# Patient Record
Sex: Female | Born: 1960 | Race: White | Hispanic: No | Marital: Married | Smoking: Current every day smoker
Health system: Southern US, Community
[De-identification: ages and names within clinical notes are randomized; demographics above are authoritative.]

## PROBLEM LIST (undated history)

## (undated) DIAGNOSIS — D649 Anemia, unspecified: Secondary | ICD-10-CM

## (undated) DIAGNOSIS — K56699 Other intestinal obstruction unspecified as to partial versus complete obstruction: Secondary | ICD-10-CM

## (undated) DIAGNOSIS — K219 Gastro-esophageal reflux disease without esophagitis: Secondary | ICD-10-CM

## (undated) DIAGNOSIS — Z8489 Family history of other specified conditions: Secondary | ICD-10-CM

## (undated) DIAGNOSIS — I10 Essential (primary) hypertension: Secondary | ICD-10-CM

## (undated) DIAGNOSIS — K56609 Unspecified intestinal obstruction, unspecified as to partial versus complete obstruction: Secondary | ICD-10-CM

## (undated) DIAGNOSIS — J45909 Unspecified asthma, uncomplicated: Secondary | ICD-10-CM

## (undated) DIAGNOSIS — C801 Malignant (primary) neoplasm, unspecified: Secondary | ICD-10-CM

## (undated) DIAGNOSIS — C539 Malignant neoplasm of cervix uteri, unspecified: Secondary | ICD-10-CM

## (undated) DIAGNOSIS — N179 Acute kidney failure, unspecified: Secondary | ICD-10-CM

## (undated) DIAGNOSIS — Z9221 Personal history of antineoplastic chemotherapy: Secondary | ICD-10-CM

## (undated) DIAGNOSIS — K635 Polyp of colon: Secondary | ICD-10-CM

## (undated) DIAGNOSIS — I745 Embolism and thrombosis of iliac artery: Secondary | ICD-10-CM

## (undated) DIAGNOSIS — Z8719 Personal history of other diseases of the digestive system: Secondary | ICD-10-CM

## (undated) DIAGNOSIS — T7840XA Allergy, unspecified, initial encounter: Secondary | ICD-10-CM

## (undated) DIAGNOSIS — F419 Anxiety disorder, unspecified: Secondary | ICD-10-CM

## (undated) DIAGNOSIS — IMO0001 Reserved for inherently not codable concepts without codable children: Secondary | ICD-10-CM

## (undated) DIAGNOSIS — F32A Depression, unspecified: Secondary | ICD-10-CM

## (undated) DIAGNOSIS — F329 Major depressive disorder, single episode, unspecified: Secondary | ICD-10-CM

## (undated) DIAGNOSIS — Z923 Personal history of irradiation: Secondary | ICD-10-CM

## (undated) DIAGNOSIS — Z8541 Personal history of malignant neoplasm of cervix uteri: Secondary | ICD-10-CM

## (undated) DIAGNOSIS — G43909 Migraine, unspecified, not intractable, without status migrainosus: Secondary | ICD-10-CM

## (undated) DIAGNOSIS — E785 Hyperlipidemia, unspecified: Secondary | ICD-10-CM

## (undated) DIAGNOSIS — Z8709 Personal history of other diseases of the respiratory system: Secondary | ICD-10-CM

## (undated) HISTORY — DX: Malignant neoplasm of cervix uteri, unspecified: C53.9

## (undated) HISTORY — DX: Unspecified intestinal obstruction, unspecified as to partial versus complete obstruction: K56.609

## (undated) HISTORY — DX: Malignant (primary) neoplasm, unspecified: C80.1

## (undated) HISTORY — PX: TUBAL LIGATION: SHX77

## (undated) HISTORY — DX: Personal history of malignant neoplasm of cervix uteri: Z85.41

## (undated) HISTORY — DX: Allergy, unspecified, initial encounter: T78.40XA

## (undated) HISTORY — DX: Polyp of colon: K63.5

## (undated) HISTORY — PX: UPPER GASTROINTESTINAL ENDOSCOPY: SHX188

## (undated) HISTORY — DX: Other intestinal obstruction unspecified as to partial versus complete obstruction: K56.699

## (undated) HISTORY — PX: COLONOSCOPY: SHX174

## (undated) HISTORY — PX: PICC LINE PLACE PERIPHERAL (ARMC HX): HXRAD1248

## (undated) HISTORY — DX: Hyperlipidemia, unspecified: E78.5

## (undated) HISTORY — PX: COLON SURGERY: SHX602

## (undated) HISTORY — DX: Unspecified asthma, uncomplicated: J45.909

## (undated) HISTORY — PX: OTHER SURGICAL HISTORY: SHX169

---

## 1992-05-12 HISTORY — PX: RADICAL ABDOMINAL HYSTERECTOMY: SUR659

## 1992-05-12 HISTORY — PX: ABDOMINAL HYSTERECTOMY: SHX81

## 1997-10-28 ENCOUNTER — Ambulatory Visit (HOSPITAL_COMMUNITY): Admission: RE | Admit: 1997-10-28 | Discharge: 1997-10-28 | Payer: Self-pay | Admitting: Family Medicine

## 1997-11-02 ENCOUNTER — Ambulatory Visit (HOSPITAL_COMMUNITY): Admission: RE | Admit: 1997-11-02 | Discharge: 1997-11-02 | Payer: Self-pay | Admitting: Internal Medicine

## 1997-11-30 ENCOUNTER — Other Ambulatory Visit: Admission: RE | Admit: 1997-11-30 | Discharge: 1997-11-30 | Payer: Self-pay | Admitting: Gynecology

## 1998-02-28 ENCOUNTER — Other Ambulatory Visit: Admission: RE | Admit: 1998-02-28 | Discharge: 1998-02-28 | Payer: Self-pay | Admitting: Gynecology

## 1998-04-03 ENCOUNTER — Other Ambulatory Visit: Admission: RE | Admit: 1998-04-03 | Discharge: 1998-04-03 | Payer: Self-pay | Admitting: Gynecology

## 1998-04-17 ENCOUNTER — Ambulatory Visit: Admission: RE | Admit: 1998-04-17 | Discharge: 1998-04-17 | Payer: Self-pay | Admitting: Gynecology

## 1998-06-07 ENCOUNTER — Other Ambulatory Visit: Admission: RE | Admit: 1998-06-07 | Discharge: 1998-06-07 | Payer: Self-pay | Admitting: Gynecology

## 1998-07-10 ENCOUNTER — Other Ambulatory Visit: Admission: RE | Admit: 1998-07-10 | Discharge: 1998-07-10 | Payer: Self-pay | Admitting: Gynecology

## 1998-08-21 ENCOUNTER — Other Ambulatory Visit: Admission: RE | Admit: 1998-08-21 | Discharge: 1998-08-21 | Payer: Self-pay | Admitting: Gynecology

## 1998-10-22 ENCOUNTER — Other Ambulatory Visit: Admission: RE | Admit: 1998-10-22 | Discharge: 1998-10-22 | Payer: Self-pay | Admitting: Gynecology

## 1999-02-04 ENCOUNTER — Other Ambulatory Visit: Admission: RE | Admit: 1999-02-04 | Discharge: 1999-02-04 | Payer: Self-pay | Admitting: Gynecology

## 1999-03-21 ENCOUNTER — Other Ambulatory Visit: Admission: RE | Admit: 1999-03-21 | Discharge: 1999-03-21 | Payer: Self-pay | Admitting: Gynecology

## 1999-03-21 ENCOUNTER — Encounter (INDEPENDENT_AMBULATORY_CARE_PROVIDER_SITE_OTHER): Payer: Self-pay

## 1999-06-14 ENCOUNTER — Other Ambulatory Visit: Admission: RE | Admit: 1999-06-14 | Discharge: 1999-06-14 | Payer: Self-pay | Admitting: Gynecology

## 1999-06-19 ENCOUNTER — Encounter: Admission: RE | Admit: 1999-06-19 | Discharge: 1999-06-19 | Payer: Self-pay | Admitting: Gynecology

## 1999-06-19 ENCOUNTER — Encounter: Payer: Self-pay | Admitting: Gynecology

## 1999-08-02 ENCOUNTER — Other Ambulatory Visit: Admission: RE | Admit: 1999-08-02 | Discharge: 1999-08-02 | Payer: Self-pay | Admitting: Gynecology

## 1999-12-24 ENCOUNTER — Encounter: Payer: Self-pay | Admitting: Gynecology

## 1999-12-24 ENCOUNTER — Encounter: Admission: RE | Admit: 1999-12-24 | Discharge: 1999-12-24 | Payer: Self-pay | Admitting: Gynecology

## 2000-04-16 ENCOUNTER — Other Ambulatory Visit: Admission: RE | Admit: 2000-04-16 | Discharge: 2000-04-16 | Payer: Self-pay | Admitting: Gynecology

## 2000-10-12 ENCOUNTER — Other Ambulatory Visit: Admission: RE | Admit: 2000-10-12 | Discharge: 2000-10-12 | Payer: Self-pay | Admitting: Gynecology

## 2001-05-19 ENCOUNTER — Other Ambulatory Visit: Admission: RE | Admit: 2001-05-19 | Discharge: 2001-05-19 | Payer: Self-pay | Admitting: Gynecology

## 2001-05-24 ENCOUNTER — Encounter: Payer: Self-pay | Admitting: Family Medicine

## 2001-05-24 ENCOUNTER — Encounter: Admission: RE | Admit: 2001-05-24 | Discharge: 2001-05-24 | Payer: Self-pay | Admitting: Family Medicine

## 2002-06-02 ENCOUNTER — Other Ambulatory Visit: Admission: RE | Admit: 2002-06-02 | Discharge: 2002-06-02 | Payer: Self-pay | Admitting: Gynecology

## 2003-05-25 ENCOUNTER — Other Ambulatory Visit: Admission: RE | Admit: 2003-05-25 | Discharge: 2003-05-25 | Payer: Self-pay | Admitting: Gynecology

## 2003-08-11 ENCOUNTER — Encounter: Admission: RE | Admit: 2003-08-11 | Discharge: 2003-08-11 | Payer: Self-pay | Admitting: Gynecology

## 2003-08-28 ENCOUNTER — Other Ambulatory Visit: Admission: RE | Admit: 2003-08-28 | Discharge: 2003-08-28 | Payer: Self-pay | Admitting: Gynecology

## 2004-02-19 ENCOUNTER — Other Ambulatory Visit: Admission: RE | Admit: 2004-02-19 | Discharge: 2004-02-19 | Payer: Self-pay | Admitting: Gynecology

## 2004-06-24 ENCOUNTER — Other Ambulatory Visit: Admission: RE | Admit: 2004-06-24 | Discharge: 2004-06-24 | Payer: Self-pay | Admitting: Gynecology

## 2004-10-17 ENCOUNTER — Encounter: Admission: RE | Admit: 2004-10-17 | Discharge: 2004-10-17 | Payer: Self-pay | Admitting: Gynecology

## 2005-01-03 ENCOUNTER — Other Ambulatory Visit: Admission: RE | Admit: 2005-01-03 | Discharge: 2005-01-03 | Payer: Self-pay | Admitting: Gynecology

## 2005-07-01 ENCOUNTER — Other Ambulatory Visit: Admission: RE | Admit: 2005-07-01 | Discharge: 2005-07-01 | Payer: Self-pay | Admitting: Gynecology

## 2005-12-24 ENCOUNTER — Encounter: Admission: RE | Admit: 2005-12-24 | Discharge: 2005-12-24 | Payer: Self-pay | Admitting: Gynecology

## 2005-12-25 ENCOUNTER — Other Ambulatory Visit: Admission: RE | Admit: 2005-12-25 | Discharge: 2005-12-25 | Payer: Self-pay | Admitting: Gynecology

## 2006-03-31 ENCOUNTER — Other Ambulatory Visit: Admission: RE | Admit: 2006-03-31 | Discharge: 2006-03-31 | Payer: Self-pay | Admitting: Gynecology

## 2006-06-01 ENCOUNTER — Other Ambulatory Visit: Admission: RE | Admit: 2006-06-01 | Discharge: 2006-06-01 | Payer: Self-pay | Admitting: Gynecology

## 2006-10-19 ENCOUNTER — Other Ambulatory Visit: Admission: RE | Admit: 2006-10-19 | Discharge: 2006-10-19 | Payer: Self-pay | Admitting: Gynecology

## 2006-12-28 ENCOUNTER — Encounter: Admission: RE | Admit: 2006-12-28 | Discharge: 2006-12-28 | Payer: Self-pay | Admitting: Gynecology

## 2007-04-14 ENCOUNTER — Other Ambulatory Visit: Admission: RE | Admit: 2007-04-14 | Discharge: 2007-04-14 | Payer: Self-pay | Admitting: Gynecology

## 2007-12-30 ENCOUNTER — Encounter: Admission: RE | Admit: 2007-12-30 | Discharge: 2007-12-30 | Payer: Self-pay | Admitting: Gynecology

## 2009-01-01 ENCOUNTER — Encounter: Admission: RE | Admit: 2009-01-01 | Discharge: 2009-01-01 | Payer: Self-pay | Admitting: Gynecology

## 2009-10-15 ENCOUNTER — Emergency Department (HOSPITAL_COMMUNITY): Admission: EM | Admit: 2009-10-15 | Discharge: 2009-10-15 | Payer: Self-pay | Admitting: Emergency Medicine

## 2010-01-02 ENCOUNTER — Encounter: Admission: RE | Admit: 2010-01-02 | Discharge: 2010-01-02 | Payer: Self-pay | Admitting: Gynecology

## 2011-01-14 ENCOUNTER — Other Ambulatory Visit: Payer: Self-pay | Admitting: Gynecology

## 2011-01-14 DIAGNOSIS — Z1231 Encounter for screening mammogram for malignant neoplasm of breast: Secondary | ICD-10-CM

## 2011-01-21 ENCOUNTER — Ambulatory Visit
Admission: RE | Admit: 2011-01-21 | Discharge: 2011-01-21 | Disposition: A | Discharge status: Home / Self Care | Payer: Commercial Managed Care - PPO | Source: Ambulatory Visit | Attending: Gynecology | Admitting: Gynecology

## 2011-01-21 DIAGNOSIS — Z1231 Encounter for screening mammogram for malignant neoplasm of breast: Secondary | ICD-10-CM

## 2011-04-13 IMAGING — CR DG RIBS W/ CHEST 3+V*L*
3 series · 3 of 3 positions shown · non-contrast
Comparison: No prior studies

CLINICAL DATA: Fall from ladder.  Pain left mid chest.

LEFT RIBS AND CHEST - 3+ VIEW

[w chest pa]
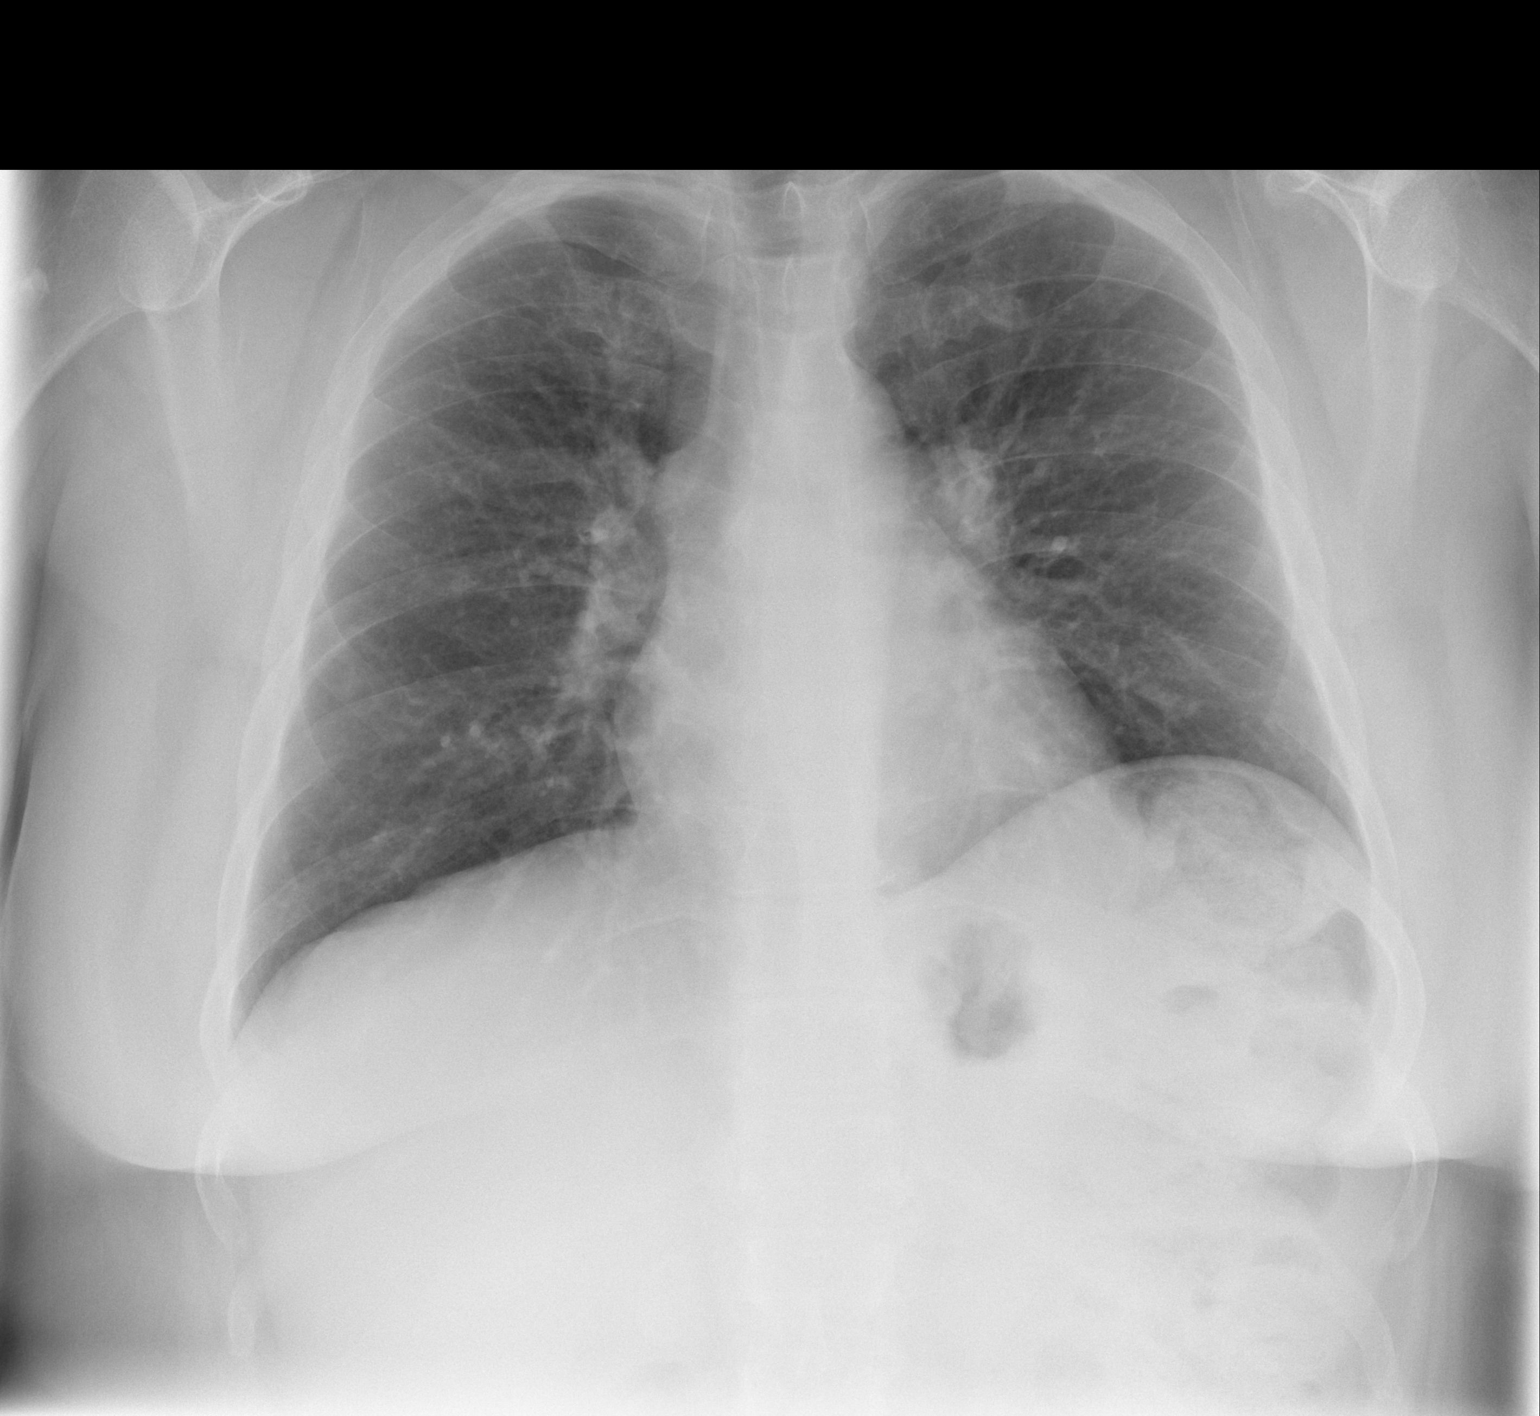

[w ribs ap/pa upper left *]
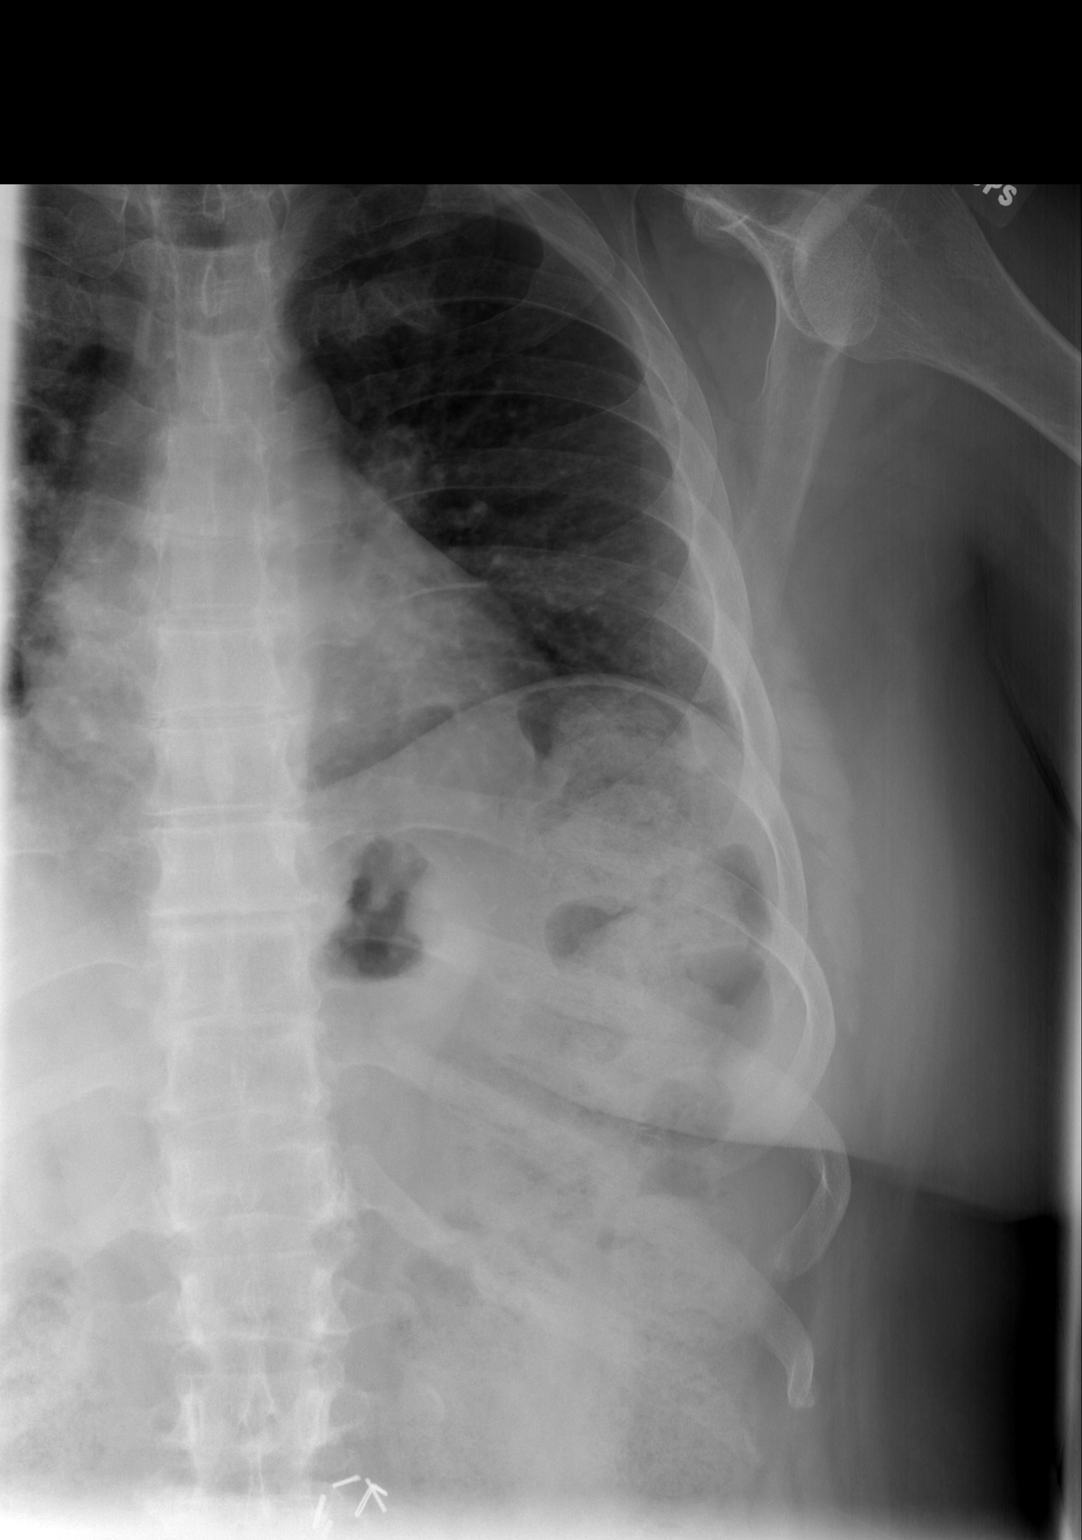

[w ribs ap/pa lower left *]
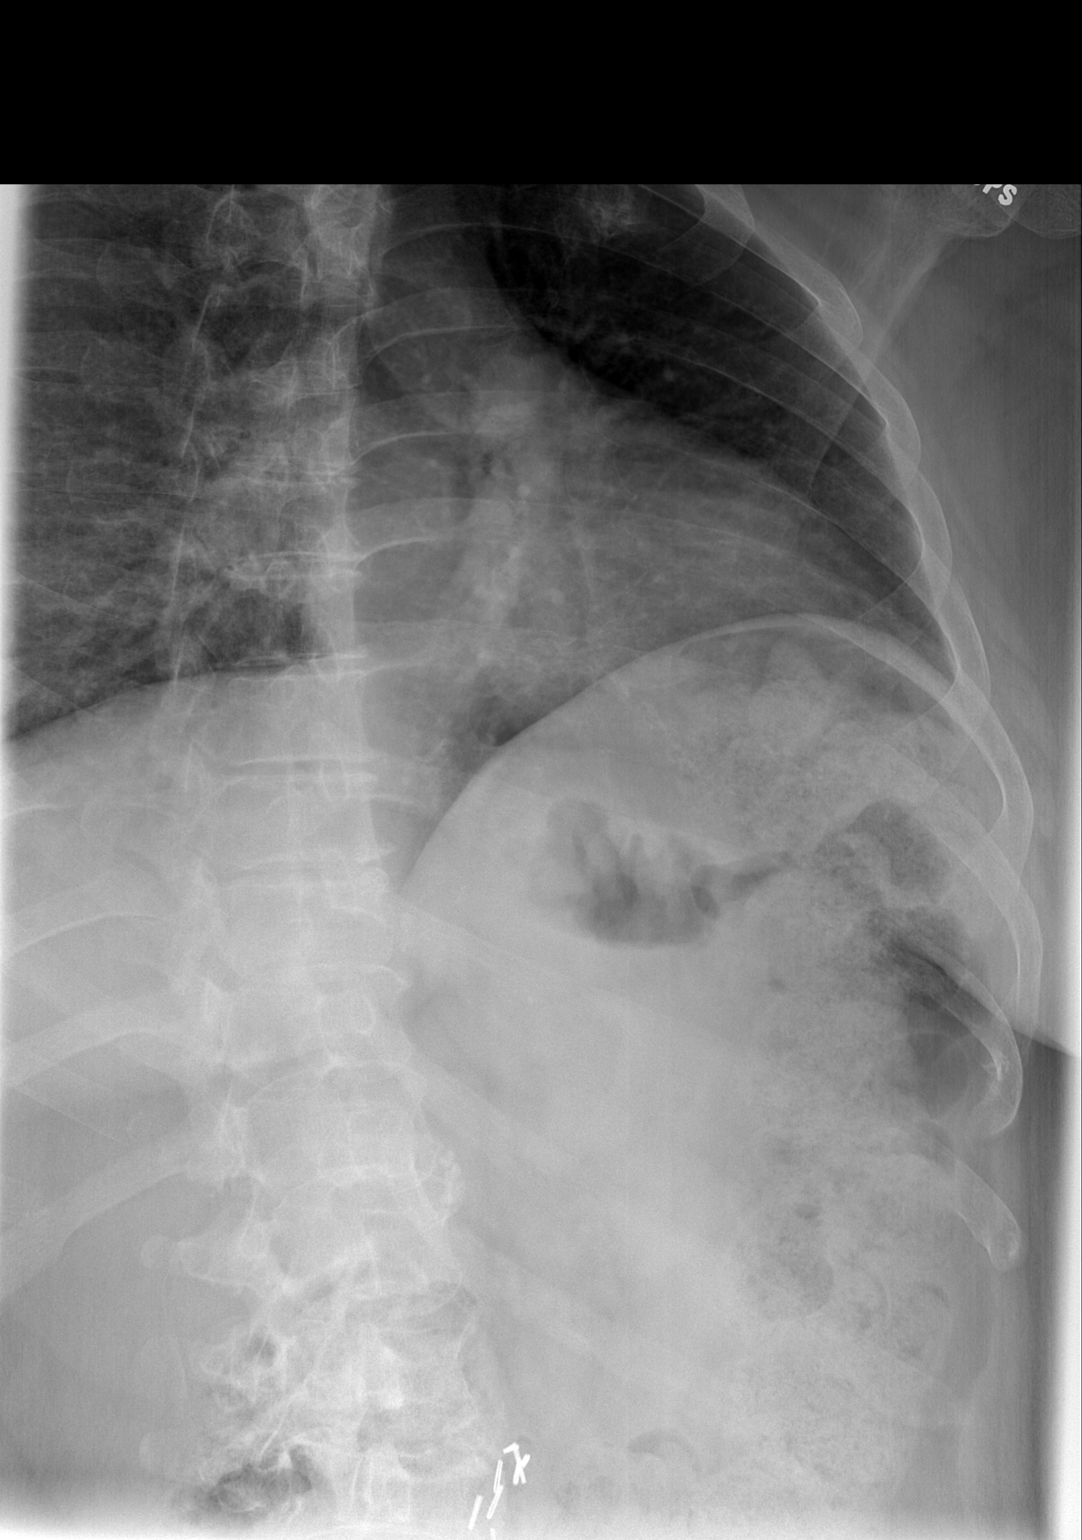

[3 of 3 positions shown; findings below may reference images not displayed]

FINDINGS: There is a nondisplaced fracture of the anterior left
eighth rib.  There are no other fractures.  There is no
pneumothorax or hemothorax.  The heart is normal in size.  There
are mildly accentuated bronchovascular markings.  There are no
infiltrates or atelectatic changes.
IMPRESSION: Nondisplaced fracture the anterior left eighth rib.  No
pneumothorax.

## 2011-08-19 DIAGNOSIS — D229 Melanocytic nevi, unspecified: Secondary | ICD-10-CM | POA: Insufficient documentation

## 2011-12-19 ENCOUNTER — Other Ambulatory Visit: Payer: Self-pay | Admitting: Gynecology

## 2011-12-19 DIAGNOSIS — Z1231 Encounter for screening mammogram for malignant neoplasm of breast: Secondary | ICD-10-CM

## 2012-01-22 ENCOUNTER — Ambulatory Visit
Admission: RE | Admit: 2012-01-22 | Discharge: 2012-01-22 | Disposition: A | Discharge status: Home / Self Care | Payer: Commercial Managed Care - PPO | Source: Ambulatory Visit | Attending: Gynecology | Admitting: Gynecology

## 2012-01-22 DIAGNOSIS — Z1231 Encounter for screening mammogram for malignant neoplasm of breast: Secondary | ICD-10-CM

## 2013-02-18 ENCOUNTER — Other Ambulatory Visit: Payer: Self-pay | Admitting: Gynecology

## 2013-02-18 DIAGNOSIS — N6321 Unspecified lump in the left breast, upper outer quadrant: Secondary | ICD-10-CM

## 2013-02-18 DIAGNOSIS — M8580 Other specified disorders of bone density and structure, unspecified site: Secondary | ICD-10-CM

## 2013-03-02 ENCOUNTER — Ambulatory Visit
Admission: RE | Admit: 2013-03-02 | Discharge: 2013-03-02 | Disposition: A | Discharge status: Home / Self Care | Payer: Commercial Managed Care - PPO | Source: Ambulatory Visit | Attending: Gynecology | Admitting: Gynecology

## 2013-03-02 DIAGNOSIS — N6321 Unspecified lump in the left breast, upper outer quadrant: Secondary | ICD-10-CM

## 2013-03-25 ENCOUNTER — Ambulatory Visit
Admission: RE | Admit: 2013-03-25 | Discharge: 2013-03-25 | Disposition: A | Discharge status: Home / Self Care | Payer: Commercial Managed Care - PPO | Source: Ambulatory Visit | Attending: Gynecology | Admitting: Gynecology

## 2013-03-25 DIAGNOSIS — M8580 Other specified disorders of bone density and structure, unspecified site: Secondary | ICD-10-CM

## 2013-10-13 ENCOUNTER — Encounter (HOSPITAL_COMMUNITY): Payer: Self-pay | Admitting: Emergency Medicine

## 2013-10-13 ENCOUNTER — Emergency Department (HOSPITAL_COMMUNITY)
Admission: EM | Admit: 2013-10-13 | Discharge: 2013-10-13 | Disposition: A | Discharge status: Home / Self Care | Payer: Worker's Compensation | Attending: Emergency Medicine | Admitting: Emergency Medicine

## 2013-10-13 ENCOUNTER — Emergency Department (HOSPITAL_COMMUNITY): Payer: Worker's Compensation

## 2013-10-13 DIAGNOSIS — Y939 Activity, unspecified: Secondary | ICD-10-CM | POA: Insufficient documentation

## 2013-10-13 DIAGNOSIS — Y929 Unspecified place or not applicable: Secondary | ICD-10-CM | POA: Insufficient documentation

## 2013-10-13 DIAGNOSIS — S59909A Unspecified injury of unspecified elbow, initial encounter: Secondary | ICD-10-CM | POA: Insufficient documentation

## 2013-10-13 DIAGNOSIS — S59919A Unspecified injury of unspecified forearm, initial encounter: Principal | ICD-10-CM

## 2013-10-13 DIAGNOSIS — S6990XA Unspecified injury of unspecified wrist, hand and finger(s), initial encounter: Principal | ICD-10-CM

## 2013-10-13 DIAGNOSIS — W010XXA Fall on same level from slipping, tripping and stumbling without subsequent striking against object, initial encounter: Secondary | ICD-10-CM | POA: Insufficient documentation

## 2013-10-13 DIAGNOSIS — S59902A Unspecified injury of left elbow, initial encounter: Secondary | ICD-10-CM

## 2013-10-13 DIAGNOSIS — F17200 Nicotine dependence, unspecified, uncomplicated: Secondary | ICD-10-CM | POA: Insufficient documentation

## 2013-10-13 DIAGNOSIS — I10 Essential (primary) hypertension: Secondary | ICD-10-CM | POA: Insufficient documentation

## 2013-10-13 DIAGNOSIS — W19XXXA Unspecified fall, initial encounter: Secondary | ICD-10-CM

## 2013-10-13 HISTORY — DX: Essential (primary) hypertension: I10

## 2013-10-13 MED ORDER — IBUPROFEN 800 MG PO TABS: 800.0000 mg | ORAL_TABLET | Freq: Three times a day (TID) | ORAL | Status: DC

## 2013-10-13 MED ORDER — HYDROCODONE-ACETAMINOPHEN 5-325 MG PO TABS
1.0000 | ORAL_TABLET | Freq: Once | ORAL | Status: AC
  Administered 2013-10-13: 1 via ORAL
  Filled 2013-10-13: qty 1

## 2013-10-13 MED ORDER — METHOCARBAMOL 500 MG PO TABS: 500.0000 mg | ORAL_TABLET | Freq: Two times a day (BID) | ORAL | Status: DC

## 2013-10-13 NOTE — ED Provider Notes (Signed)
Medical screening examination/treatment/procedure(s) were performed by non-physician practitioner and as supervising physician I was immediately available for consultation/collaboration.   EKG Interpretation None        Delice Bison Kip Kautzman, DO 10/13/13 1545

## 2013-10-13 NOTE — ED Notes (Signed)
Pt reports tripping and falling this am, landed on concrete on her chest and left arm. Having pain to left ribs, shoulder, arm and hand.

## 2013-10-13 NOTE — ED Notes (Signed)
PT refused W/C at time of discharge. Pt escorted to front lobby by RN.

## 2013-10-13 NOTE — ED Provider Notes (Signed)
CSN: [id number]     Arrival date & time 10/13/13  1113 History   This chart was scribed for Domenic Moras PA-C working with Hedrick, DO by Stacy Gardner, ED scribe. This patient was seen in room TR07C/TR07C and the patient's care was started at 11:58 AM. First MD Initiated Contact with Patient 10/13/13 1129     Chief Complaint  Patient presents with  . Fall     (Consider location/radiation/quality/duration/timing/severity/associated sxs/prior Treatment) The history is provided by the patient and medical records. No language interpreter was used.   HPI Comments: KATLIN BORTNER is a 53 y.o. female who presents to the Emergency Department complaining of fall that occurred this morning. Pt fell onto cobble stones and landed on her left side. The pain is worse with taking deep inspirations. She has left lower arm pain, L wrist, L hand, and left sided rib pain. The pain is worse with rotation and movement of her left wrist and hand. The pain is 6/10 in severity. Pt states that codeine "hypes me up". Denies SOB and trouble breathing.      Past Medical History  Diagnosis Date  . Hypertension    History reviewed. No pertinent past surgical history. History reviewed. No pertinent family history. History  Substance Use Topics  . Smoking status: Current Every Day Smoker    Types: Cigarettes  . Smokeless tobacco: Not on file  . Alcohol Use: No   OB History   Grav Para Term Preterm Abortions TAB SAB Ect Mult Living                 Review of Systems  Respiratory: Negative for shortness of breath.   Musculoskeletal: Positive for arthralgias, gait problem and myalgias.  All other systems reviewed and are negative.     Allergies  Codeine  Home Medications   Prior to Admission medications   Not on File   BP 181/81  Pulse 80  Temp(Src) 98.2 F (36.8 C) (Oral)  Resp 18  SpO2 97% Physical Exam  Nursing note and vitals reviewed. Constitutional: She is oriented to person,  place, and time. She appears well-developed and well-nourished. No distress.  HENT:  Head: Normocephalic and atraumatic.  Eyes: EOM are normal. Pupils are equal, round, and reactive to light.  Neck: Normal range of motion. Neck supple. No tracheal deviation present.  Cardiovascular: Normal rate.   Pulmonary/Chest: Effort normal. No respiratory distress. She exhibits no tenderness.  Abdominal: Soft. She exhibits no distension.  Musculoskeletal: Normal range of motion. She exhibits tenderness.  Left hand without focal pont tenderness No deformties normal flexion and extension Pain with supination and pronation.  Tenderness to mid forearm with palpation.  no deformity.  no edema.  No ecchymosis.  Left elbow normal flexion and extension.  Tenderness to the left anterior ribs w/o emphysema with crepitus or deformity  Neurological: She is alert and oriented to person, place, and time.  Skin: Skin is warm and dry. She is not diaphoretic.  Psychiatric: She has a normal mood and affect. Her behavior is normal.    ED Course  Procedures (including critical care time) DIAGNOSTIC STUDIES: Oxygen Saturation is 97% on room air, normal by my interpretation.    COORDINATION OF CARE:  12:01 PM Discussed course of care with pt which includes left wrist, ribs and forearm x-ray and pain medication. Pt understands and agrees.   Labs Review Labs Reviewed - No data to display  Imaging Review Dg Ribs Unilateral W/chest Left  10/13/2013   CLINICAL DATA:  Status post fall  EXAM: LEFT RIBS AND CHEST - 3+ VIEW  COMPARISON:  None.  FINDINGS: No fracture or other bone lesions are seen involving the ribs. There is no evidence of pneumothorax or pleural effusion. Mild bilateral interstitial prominence low, likely chronic. No focal parenchymal opacity. Heart size and mediastinal contours are within normal limits.  IMPRESSION: No acute osseous injury of the left ribs.   Electronically Signed   By: Kathreen Devoid    On: 10/13/2013 12:48   Dg Forearm Left  10/13/2013   CLINICAL DATA:  Status post fall with proximal forearm pain.  EXAM: LEFT FOREARM - 2 VIEW  COMPARISON:  Wrist series of today's date  FINDINGS: The shafts of the left radius and ulna are intact. As best as can be determined there is no radial head or olecranon fracture. No posterior fat pad sign is demonstrated. The distal radius and ulna also appear intact though are chronic changes associated with the ulnar styloid.  IMPRESSION: There is no acute bony abnormality of the left forearm. If there are significant symptoms referable to the elbow, a dedicated elbow series would be useful.   Electronically Signed   By: David  Martinique   On: 10/13/2013 12:48   Dg Wrist Complete Left  10/13/2013   CLINICAL DATA:  Forearm and wrist pain status post fall.  EXAM: LEFT WRIST - COMPLETE 3+ VIEW  COMPARISON:  Four of series of today's date  FINDINGS: The distal radius is intact. There is old deformity of the ulnar styloid. The carpal bones are intact. There is mild degenerative change of the radiocarpal and ulnocarpal joints. Mild degenerative change of the first carpometacarpal joint is present. The scaphoid is intact. There is mild diffuse soft tissue swelling.  IMPRESSION: There is no acute bony abnormality of the left wrist. 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   Electronically Signed   By: David  Martinique   On: 10/13/2013 12:46     EKG Interpretation None      MDM   Final diagnoses:  Fall from standing  Injury of left elbow    BP 181/81  Pulse 80  Temp(Src) 98.2 F (36.8 C) (Oral)  Resp 18  SpO2 97% Pt made aware BP is high and will need to have it recheck by her PCP.    I have reviewed nursing notes and vital signs. I personally reviewed the imaging tests through PACS system  I reviewed available ER/hospitalization records thought the EMR  I personally performed the services described in this documentation, which was scribed in my  presence. The recorded information has been reviewed and is accurate.        Domenic Moras, PA-C 10/13/13 1321

## 2013-10-13 NOTE — Discharge Instructions (Signed)

## 2014-03-06 ENCOUNTER — Other Ambulatory Visit: Payer: Self-pay

## 2014-03-06 DIAGNOSIS — Z1231 Encounter for screening mammogram for malignant neoplasm of breast: Secondary | ICD-10-CM

## 2014-03-08 ENCOUNTER — Ambulatory Visit
Admission: RE | Admit: 2014-03-08 | Discharge: 2014-03-08 | Disposition: A | Discharge status: Home / Self Care | Payer: Commercial Managed Care - PPO | Source: Ambulatory Visit

## 2014-03-08 DIAGNOSIS — Z1231 Encounter for screening mammogram for malignant neoplasm of breast: Secondary | ICD-10-CM

## 2014-03-21 ENCOUNTER — Encounter: Payer: Self-pay | Admitting: Internal Medicine

## 2014-04-26 ENCOUNTER — Ambulatory Visit (AMBULATORY_SURGERY_CENTER): Payer: Self-pay | Admitting: *Deleted

## 2014-04-26 VITALS — Ht 61.0 in | Wt 224.6 lb

## 2014-04-26 DIAGNOSIS — Z1211 Encounter for screening for malignant neoplasm of colon: Secondary | ICD-10-CM

## 2014-04-26 MED ORDER — MOVIPREP 100 G PO SOLR: 1.0000 | Freq: Once | ORAL | Status: DC

## 2014-04-26 NOTE — Progress Notes (Signed)
Denies allergies to eggs or soy products. Denies complications with sedation or anesthesia. Denies O2 use. Denies use of diet or weight loss medications.  Emmi instructions given for colonoscopy.  

## 2014-05-18 ENCOUNTER — Ambulatory Visit (AMBULATORY_SURGERY_CENTER): Payer: Commercial Managed Care - PPO | Admitting: Internal Medicine

## 2014-05-18 ENCOUNTER — Encounter: Payer: Self-pay | Admitting: Internal Medicine

## 2014-05-18 VITALS — BP 97/64 | HR 75 | Temp 98.1°F | Resp 21 | Ht 61.0 in | Wt 224.0 lb

## 2014-05-18 DIAGNOSIS — D129 Benign neoplasm of anus and anal canal: Secondary | ICD-10-CM

## 2014-05-18 DIAGNOSIS — Z1211 Encounter for screening for malignant neoplasm of colon: Secondary | ICD-10-CM

## 2014-05-18 DIAGNOSIS — K5669 Other intestinal obstruction: Secondary | ICD-10-CM

## 2014-05-18 DIAGNOSIS — K56699 Other intestinal obstruction unspecified as to partial versus complete obstruction: Secondary | ICD-10-CM

## 2014-05-18 DIAGNOSIS — D128 Benign neoplasm of rectum: Secondary | ICD-10-CM

## 2014-05-18 DIAGNOSIS — K621 Rectal polyp: Secondary | ICD-10-CM

## 2014-05-18 DIAGNOSIS — K633 Ulcer of intestine: Secondary | ICD-10-CM

## 2014-05-18 MED ORDER — SODIUM CHLORIDE 0.9 % IV SOLN: 500.0000 mL | INTRAVENOUS | Status: DC

## 2014-05-18 NOTE — Patient Instructions (Signed)
YOU HAD AN ENDOSCOPIC PROCEDURE TODAY AT THE [facility]: Refer to the procedure report that was given to you for any specific questions about what was found during the examination.  If the procedure report does not answer your questions, please call your gastroenterologist to clarify.  If you requested that your care partner not be given the details of your procedure findings, then the procedure report has been included in a sealed envelope for you to review at your convenience later.  YOU SHOULD EXPECT: Some feelings of bloating in the abdomen. Passage of more gas than usual.  Walking can help get rid of the air that was put into your GI tract during the procedure and reduce the bloating. If you had a lower endoscopy (such as a colonoscopy or flexible sigmoidoscopy) you may notice spotting of blood in your stool or on the toilet paper. If you underwent a bowel prep for your procedure, then you may not have a normal bowel movement for a few days.  DIET: Your first meal following the procedure should be a light meal and then it is ok to progress to your normal diet.  A half-sandwich or bowl of soup is an example of a good first meal.  Heavy or fried foods are harder to digest and may make you feel nauseous or bloated.  Likewise meals heavy in dairy and vegetables can cause extra gas to form and this can also increase the bloating.  Drink plenty of fluids but you should avoid alcoholic beverages for 24 hours.  ACTIVITY: Your care partner should take you home directly after the procedure.  You should plan to take it easy, moving slowly for the rest of the day.  You can resume normal activity the day after the procedure however you should NOT DRIVE or use heavy machinery for 24 hours (because of the sedation medicines used during the test).    SYMPTOMS TO REPORT IMMEDIATELY: A gastroenterologist can be reached at any hour.  During normal business hours, 8:30 AM to 5:00 PM Monday through Friday,  call [phone].  After hours and on weekends, please call the GI answering service at [phone] who will take a message and have the physician on call contact you.   Following lower endoscopy (colonoscopy or flexible sigmoidoscopy):  Excessive amounts of blood in the stool  Significant tenderness or worsening of abdominal pains  Swelling of the abdomen that is new, acute  Fever of 100F or higher  FOLLOW UP: If any biopsies were taken you will be contacted by phone or by letter within the next 1-3 weeks.  Call your gastroenterologist if you have not heard about the biopsies in 3 weeks.  Our staff will call the home number listed on your records the next business day following your procedure to check on you and address any questions or concerns that you may have at that time regarding the information given to you following your procedure. This is a courtesy call and so if there is no answer at the home number and we have not heard from you through the emergency physician on call, we will assume that you have returned to your regular daily activities without incident.  SIGNATURES/CONFIDENTIALITY: You and/or your care partner have signed paperwork which will be entered into your electronic medical record.  These signatures attest to the fact that that the information above on your After Visit Summary has been reviewed and is understood.  Full responsibility of the confidentiality of this   discharge information lies with you and/or your care-partner.  You are rescheduled for another colonoscopy on January 26th at 8:30.  Please, be here at 7:30.  Read all handouts given to you by your recovery room nurse.

## 2014-05-18 NOTE — Progress Notes (Signed)
Report to PACU, RN, vss, BBS= Clear.  

## 2014-05-18 NOTE — Op Note (Signed)
[place]  [address] [place], [address]   COLONOSCOPY PROCEDURE REPORT  PATIENT: Cathy, Jones  MR#: [id number] BIRTHDATE: [birth year] , 53  yrs. old GENDER: female ENDOSCOPIST: Eustace Quail, MD REFERRED YS:AYTK Kaplan, [place]:  05/18/2014 PROCEDURE:   Colonoscopy with snare polypectomy x 2 and Colonoscopy with biopsy First Screening Colonoscopy - Avg.  risk and is 50 yrs.  old or older - No.  Prior Negative Screening - Now for repeat screening. N/A  History of Adenoma - Now for follow-up colonoscopy & has been > or = to 3 yrs.  N/A  Polyps Removed Today? Yes. ASA CLASS:   Class II INDICATIONS:average risk for colorectal cancer. MEDICATIONS: Monitored anesthesia care and Propofol 330 mg IV  DESCRIPTION OF PROCEDURE:   After the risks benefits and alternatives of the procedure were thoroughly explained, informed consent was obtained.  The digital rectal exam revealed no abnormalities of the rectum.   The LB [id number]  endoscope was introduced through the anus and advanced to the sigmoid colon. No adverse events experienced.   Limited by a stricture.   The quality of the prep was poor, using MoviPrep  The instrument was then slowly withdrawn as the colon was fully examined.  COLON FINDINGS: The colonoscope was advanced to the distal sigmoid colon and at approximate 15 cm from the anal verge was a tight (55 mm) benign-appearing fibrotic stricture with mild friability. Neither the standard colonoscope nor the upper endoscope would pass beyond.  Biopsies taken.  Preparation was poor.  2 small polyps in the rectum measuring about 5 mm were removed with cold snare.  The hyperplastic-appearing polyp was retrieved.  The adenomatous-appearing polyp was lost in the for preparation. Retroflexed views revealed no abnormalities. The time to cecum=minutes 0 seconds.  Withdrawal time=minutes 0 seconds.  The scope was withdrawn and the procedure  completed. COMPLICATIONS: There were no immediate complications.  ENDOSCOPIC IMPRESSION: 1. 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 Diminutive rectal polyps removed  RECOMMENDATIONS: 1. Schedule repeat Colonoscopy with plans for balloon dilation of stricture and subsequent completion of exam 2. The patient will need more extensive prep. Recommend 2 days of clear liquids, one bottle of magnesium citrate the day prior followed by standard Movi prep.  eSigned:  Eustace Quail, MD 05/18/2014 9:30 AM   cc: Emmie Niemann, PA and The Patient

## 2014-05-18 NOTE — Progress Notes (Signed)
Called to room to assist during endoscopic procedure.  Patient ID and intended procedure confirmed with present staff. Received instructions for my participation in the procedure from the performing physician.  

## 2014-05-19 ENCOUNTER — Telehealth: Payer: Self-pay

## 2014-05-19 NOTE — Telephone Encounter (Signed)
Left message on answering machine. 

## 2014-05-22 ENCOUNTER — Ambulatory Visit (AMBULATORY_SURGERY_CENTER): Payer: Self-pay | Admitting: *Deleted

## 2014-05-22 VITALS — Ht 61.0 in | Wt 222.0 lb

## 2014-05-22 DIAGNOSIS — Z1211 Encounter for screening for malignant neoplasm of colon: Secondary | ICD-10-CM

## 2014-05-22 MED ORDER — MOVIPREP 100 G PO SOLR: ORAL | Status: DC

## 2014-05-22 NOTE — Progress Notes (Signed)
Patient denies any allergies to eggs or soy. Patient denies any problems with anesthesia/sedation. Patient denies any oxygen use at home and does not take any diet/weight loss medications. Patient already watched the EMMI during last visit.

## 2014-05-24 ENCOUNTER — Encounter: Payer: Self-pay | Admitting: Internal Medicine

## 2014-06-06 ENCOUNTER — Encounter: Payer: Self-pay | Admitting: Internal Medicine

## 2014-06-06 ENCOUNTER — Ambulatory Visit (AMBULATORY_SURGERY_CENTER): Payer: Commercial Managed Care - PPO | Admitting: Internal Medicine

## 2014-06-06 ENCOUNTER — Other Ambulatory Visit: Payer: Self-pay

## 2014-06-06 VITALS — BP 150/80 | HR 76 | Temp 97.7°F | Resp 27 | Ht 61.0 in | Wt 222.0 lb

## 2014-06-06 DIAGNOSIS — K5669 Other intestinal obstruction: Secondary | ICD-10-CM

## 2014-06-06 DIAGNOSIS — K56699 Other intestinal obstruction unspecified as to partial versus complete obstruction: Secondary | ICD-10-CM

## 2014-06-06 DIAGNOSIS — Z1211 Encounter for screening for malignant neoplasm of colon: Secondary | ICD-10-CM

## 2014-06-06 DIAGNOSIS — K633 Ulcer of intestine: Secondary | ICD-10-CM

## 2014-06-06 MED ORDER — SODIUM CHLORIDE 0.9 % IV SOLN
500.0000 mL | INTRAVENOUS | Status: DC
Start: 2014-06-06 — End: 2014-06-06

## 2014-06-06 NOTE — Progress Notes (Signed)
Left a message with Barkley Boards, CRNA that the pt was on clear liquids for two days, bottle of mag citrate, and movi prep.  Pt reports light brown liquid with some sediment noted on last stool.  Marcie Bal will report this to Dr. Henrene Pastor on arrival to endoscopy unit. maw

## 2014-06-06 NOTE — Op Note (Signed)
[place]  [address]. [place], [address]   COLONOSCOPY PROCEDURE REPORT  PATIENT: Cathy, Jones  MR#: [id number] BIRTHDATE: 12-Oct-1960 , 53  yrs. old GENDER: female ENDOSCOPIST: Eustace Quail, MD REFERRED RJ:JOAC Kaplan, [place]:  06/06/2014 PROCEDURE:   Colonoscopy with balloon dilation and Colonoscopy with biopsy First Screening Colonoscopy - Avg.  risk and is 50 yrs.  old or older - No.  Prior Negative Screening - Now for repeat screening. N/A  History of Adenoma - Now for follow-up colonoscopy & has been > or = to 3 yrs.  N/A  Polyps Removed Today? No.  Recommend repeat exam, <10 yrs? No. ASA CLASS:   Class II INDICATIONS:therapy of for previously diagnosed obstruction. . Sent for routine screening colonoscopy 05/18/2014. High-grade distal sigmoid stricture encountered. Poor prep at that time. Brought back at this time with more extensive prep and designs on dilating the stricture and examining the colon MEDICATIONS: Propofol 450 mg IV and Monitored anesthesia care  DESCRIPTION OF PROCEDURE:   After the risks benefits and alternatives of the procedure were thoroughly explained, informed consent was obtained.  The digital rectal exam revealed no abnormalities of the rectum.   The LB [id number]  endoscope was introduced through the anus and advanced to the sigmoid colon. No adverse events experienced.   Limited by a stricture.   The quality of the prep was Moviprep/2 day clear liquid/magnesium citrate-- fair  The instrument was then slowly withdrawn as the colon was fully examined.      COLON FINDINGS: The colonoscope was advanced to the distal sigmoid colon.  At approximate 20 cm was a tight stricture with surface ulceration.  A sequential balloon was dilated across the stricture at diameter as of 12 and 13.5 mm.  Stricture was reevaluated.  The endoscope would not pass.  Currently, stricture was redilated and 13.5 and 15 mm  diameters.  The standard colonoscope nor the upper endoscope would not pass beyond the stricture.  The stricture was dense, fibrotic, and inflamed.  Biopsies taken.  Suspect related to prior radiation and possibly surgery.  Retroflexed views revealed internal hemorrhoids. The time to cecum=minutes 0 seconds. Withdrawal time=minutes 0 seconds.  The scope was withdrawn and the procedure completed. COMPLICATIONS: There were no immediate complications.  ENDOSCOPIC IMPRESSION: 1. Distal sigmoid colon stricture status post balloon dilation and biopsies 2. Incomplete exam due to stricture 3. 2 rectal polyps previously removed. One retrieved (hyperplastic) and 1 not retrieved (adenomatous appearing)  RECOMMENDATIONS: 1.  Await biopsy results 2.  My office will arrange for you to have a contrast-enhanced CT scan of abdomen and pelvis "sigmoid colon stricture, evaluate" 3.  Call office for follow-up appointment in a few weeks.  eSigned:  Eustace Quail, MD 06/06/2014 8:53 AM   cc: The Patient and Emmie Niemann, [place]

## 2014-06-06 NOTE — Patient Instructions (Signed)
YOU HAD AN ENDOSCOPIC PROCEDURE TODAY AT THE [facility]: Refer to the procedure report that was given to you for any specific questions about what was found during the examination.  If the procedure report does not answer your questions, please call your gastroenterologist to clarify.  If you requested that your care partner not be given the details of your procedure findings, then the procedure report has been included in a sealed envelope for you to review at your convenience later.  YOU SHOULD EXPECT: Some feelings of bloating in the abdomen. Passage of more gas than usual.  Walking can help get rid of the air that was put into your GI tract during the procedure and reduce the bloating. If you had a lower endoscopy (such as a colonoscopy or flexible sigmoidoscopy) you may notice spotting of blood in your stool or on the toilet paper. If you underwent a bowel prep for your procedure, then you may not have a normal bowel movement for a few days.  DIET: Your first meal following the procedure should be a light meal and then it is ok to progress to your normal diet.  A half-sandwich or bowl of soup is an example of a good first meal.  Heavy or fried foods are harder to digest and may make you feel nauseous or bloated.  Likewise meals heavy in dairy and vegetables can cause extra gas to form and this can also increase the bloating.  Drink plenty of fluids but you should avoid alcoholic beverages for 24 hours.  ACTIVITY: Your care partner should take you home directly after the procedure.  You should plan to take it easy, moving slowly for the rest of the day.  You can resume normal activity the day after the procedure however you should NOT DRIVE or use heavy machinery for 24 hours (because of the sedation medicines used during the test).    SYMPTOMS TO REPORT IMMEDIATELY: A gastroenterologist can be reached at any hour.  During normal business hours, 8:30 AM to 5:00 PM Monday through Friday,  call [phone].  After hours and on weekends, please call the GI answering service at [phone] who will take a message and have the physician on call contact you.   Following lower endoscopy (colonoscopy or flexible sigmoidoscopy):  Excessive amounts of blood in the stool  Significant tenderness or worsening of abdominal pains  Swelling of the abdomen that is new, acute  Fever of 100F or higher  FOLLOW UP: If any biopsies were taken you will be contacted by phone or by letter within the next 1-3 weeks.  Call your gastroenterologist if you have not heard about the biopsies in 3 weeks.  Our staff will call the home number listed on your records the next business day following your procedure to check on you and address any questions or concerns that you may have at that time regarding the information given to you following your procedure. This is a courtesy call and so if there is no answer at the home number and we have not heard from you through the emergency physician on call, we will assume that you have returned to your regular daily activities without incident.  SIGNATURES/CONFIDENTIALITY: You and/or your care partner have signed paperwork which will be entered into your electronic medical record.  These signatures attest to the fact that that the information above on your After Visit Summary has been reviewed and is understood.  Full responsibility of the confidentiality of this   discharge information lies with you and/or your care-partner.  Polyp information given.  CT contrast of abdomen and pelvis  For sigmoid colon stricture to evaluate.    Call office for appointment in a few weeks.

## 2014-06-06 NOTE — Progress Notes (Signed)
Called to room to assist during endoscopic procedure.  Patient ID and intended procedure confirmed with present staff. Received instructions for my participation in the procedure from the performing physician.  

## 2014-06-06 NOTE — Progress Notes (Signed)
Small bruise area on below left elbow left arm.  Possibly from BP cuff. Pt. States she bruises very easily.    Contrast given with instruction for CT abdomen and pelvis.  To be scheduled by Dr. Blanch Media office.

## 2014-06-06 NOTE — Progress Notes (Signed)
Patient awakening,vss,report to rn 

## 2014-06-07 ENCOUNTER — Other Ambulatory Visit: Payer: Self-pay

## 2014-06-07 ENCOUNTER — Telehealth: Payer: Self-pay | Admitting: *Deleted

## 2014-06-07 ENCOUNTER — Telehealth: Payer: Self-pay

## 2014-06-07 DIAGNOSIS — K56699 Other intestinal obstruction unspecified as to partial versus complete obstruction: Secondary | ICD-10-CM

## 2014-06-07 NOTE — Telephone Encounter (Signed)
Number identifier, left message, follow-up  

## 2014-06-07 NOTE — Telephone Encounter (Signed)
Pt scheduled for ct of a/p at [facility] 06/13/14@2pm . Pt to be NPO after 10am, drink bottle 1 of contrast at 12noon, bottle 2 at 1pm. Pt to come this week for BMET. Pt aware of appt.

## 2014-06-08 ENCOUNTER — Telehealth: Payer: Self-pay

## 2014-06-08 NOTE — Telephone Encounter (Signed)
Pt scheduled to see Dr. Henrene Pastor 07/11/14@10 :45am. Appt letter mailed to pt.

## 2014-06-08 NOTE — Telephone Encounter (Signed)
-----   Message from Irene Shipper, MD sent at 06/08/2014 10:13 AM EST ----- Regarding: RE: OV With me in early March is fine. Thanks ----- Message -----    From: Maury Dus, RN    Sent: 06/08/2014  10:02 AM      To: Irene Shipper, MD Subject: OV                                             Dr. Henrene Pastor,  I have scheduled this pt for CT of A/P 06/13/14@2pm . You wanted her to be seen in a few weeks but your first available is not until early March. Do you want her to see an APP?  Also I ordered her old chart and will put it on your desk for review.  Thanks, [employer]

## 2014-06-09 ENCOUNTER — Other Ambulatory Visit (INDEPENDENT_AMBULATORY_CARE_PROVIDER_SITE_OTHER): Payer: Commercial Managed Care - PPO

## 2014-06-09 DIAGNOSIS — K5669 Other intestinal obstruction: Secondary | ICD-10-CM

## 2014-06-09 LAB — BASIC METABOLIC PANEL
BUN: 7 mg/dL (ref 6–23)
CALCIUM: 9.3 mg/dL (ref 8.4–10.5)
CO2: 24 meq/L (ref 19–32)
Chloride: 106 mEq/L (ref 96–112)
Creatinine, Ser: 0.74 mg/dL (ref 0.40–1.20)
GFR: 87.43 mL/min (ref >60.00)
GLUCOSE: 116 mg/dL — AB (ref 70–99)
POTASSIUM: 4.3 meq/L (ref 3.5–5.1)
Sodium: 140 mEq/L (ref 135–145)

## 2014-06-13 ENCOUNTER — Encounter: Payer: Self-pay | Admitting: Internal Medicine

## 2014-06-13 ENCOUNTER — Ambulatory Visit (INDEPENDENT_AMBULATORY_CARE_PROVIDER_SITE_OTHER)
Admission: RE | Admit: 2014-06-13 | Discharge: 2014-06-13 | Disposition: A | Discharge status: Home / Self Care | Payer: Commercial Managed Care - PPO | Source: Ambulatory Visit | Attending: Internal Medicine | Admitting: Internal Medicine

## 2014-06-13 DIAGNOSIS — K5669 Other intestinal obstruction: Secondary | ICD-10-CM

## 2014-06-13 DIAGNOSIS — K56699 Other intestinal obstruction unspecified as to partial versus complete obstruction: Secondary | ICD-10-CM

## 2014-06-13 MED ORDER — IOHEXOL 300 MG/ML  SOLN
100.0000 mL | Freq: Once | INTRAMUSCULAR | Status: AC | PRN
  Administered 2014-06-13: 100 mL via INTRAVENOUS

## 2014-07-11 ENCOUNTER — Encounter: Payer: Self-pay | Admitting: Internal Medicine

## 2014-07-11 ENCOUNTER — Ambulatory Visit (INDEPENDENT_AMBULATORY_CARE_PROVIDER_SITE_OTHER): Payer: Commercial Managed Care - PPO | Admitting: Internal Medicine

## 2014-07-11 VITALS — BP 110/70 | HR 80 | Ht 62.0 in | Wt 216.0 lb

## 2014-07-11 DIAGNOSIS — R1032 Left lower quadrant pain: Secondary | ICD-10-CM

## 2014-07-11 DIAGNOSIS — K56609 Unspecified intestinal obstruction, unspecified as to partial versus complete obstruction: Secondary | ICD-10-CM

## 2014-07-11 DIAGNOSIS — K5909 Other constipation: Secondary | ICD-10-CM

## 2014-07-11 DIAGNOSIS — K5669 Other intestinal obstruction: Secondary | ICD-10-CM

## 2014-07-11 DIAGNOSIS — R935 Abnormal findings on diagnostic imaging of other abdominal regions, including retroperitoneum: Secondary | ICD-10-CM

## 2014-07-11 DIAGNOSIS — K56699 Other intestinal obstruction unspecified as to partial versus complete obstruction: Secondary | ICD-10-CM

## 2014-07-11 NOTE — Progress Notes (Signed)
HISTORY OF PRESENT ILLNESS:  Cathy Jones is a 54 y.o. female who presents today for initial office evaluation and follow-up regarding colonic stricture identified on screening colonoscopy. The patient was sent directly, by Emmie Niemann PA-C, for screening colonoscopy 05/18/2014. She was found to have a high-grade distal sigmoid stricture not permitting completion of the exam. Also, poor preparation. 2 diminutive polyps were removed. Pathology on the first was hyperplastic. The second polyp, which appeared adenomatous, was lost in the stool pool. Upon questioning the patient post procedure she reported a remote history of cervical cancer for which she underwent total abdominal hysterectomy and lymph node dissection. This was subsequently followed by radiation therapy. In retrospect, she had been having symptoms of progressive difficulty with bowel movements. This was associated with abdominal discomfort. Rarely episodes of vomiting. No weight loss. She was set up for repeat colonoscopy with balloon dilation of the stricture after more vigorous colonic preparation. Despite several attempts at dilation, the stricture did not respond. Examination could not be completed. Subsequently, contrast-enhanced CT scan of the abdomen and pelvis was performed to rule out significant extrinsic process. She was noted to have previous GYN surgery. In addition, the sigmoid colon was noted to be diffusely narrowed with wall thickening. Prior surgery and radiation is felt to be the likely cause. She presents today for follow-up as requested. Further review of history confirms similar problems with her bowels and abdominal discomfort. We reviewed all the above in detail.  REVIEW OF SYSTEMS:  All non-GI ROS negative except for sinus allergy, headaches, hematuria  Past Medical History  Diagnosis Date  . Hypertension   . Hyperlipidemia   . Cervical cancer   . Asthma     pt brought her proair inhaler 06-06-14  . Colon polyp      hyperplastic  . Colon stricture     Past Surgical History  Procedure Laterality Date  . Abdominal hysterectomy  1994  . Colostomy    . Upper gastrointestinal endoscopy      Social History Cathy Jones  reports that she has been smoking Cigarettes.  She has been smoking about 0.50 packs per day. She has never used smokeless tobacco. She reports that she drinks alcohol. She reports that she does not use illicit drugs.  family history includes Rectal cancer in her cousin. There is no history of Colon cancer, Stomach cancer, or Esophageal cancer.  Allergies  Allergen Reactions  . Codeine Other (See Comments)    "hyper"       PHYSICAL EXAMINATION: Vital signs: BP 110/70 mmHg  Pulse 80  Ht 5\' 2"  (1.575 m)  Wt 216 lb (97.977 kg)  BMI 39.50 kg/m2 General: Well-developed, obese, well-nourished, no acute distress HEENT: Sclerae are anicteric, conjunctiva pink. Oral mucosa intact Lungs: Clear Heart: Regular Abdomen: soft, obese, nontender, nondistended, no obvious ascites, no peritoneal signs, normal bowel sounds. No organomegaly. Previous surgical incisions well-healed Extremities: No edema Psychiatric: alert and oriented x3. Cooperative   ASSESSMENT:  #1. 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 Symptomatic as described #2. Diminutive rectal polyps. Likely 1 adenoma not retrieved   PLAN:  #1. Detailed and extensive discussion today regarding the findings on colonoscopy, biopsy, and imaging. We also discussed the potential of progression to obstruction requiring surgery. In the interim, would like to improve bowel consistency with MiraLAX daily. Titrate to achieve 2-3 soft or loose bowel movements per day. #2. Schedule virtual colonoscopy to evaluate proximal colon not seen on colonoscopy, particularly given probable  rectal adenoma . We will contact her upon completion of the exam #3. Routine GI follow-up 6 months. However,  should she develop abdominal distention, significant pain, and/or nausea with vomiting that she should seek immediate medical attention for possible bowel obstruction. She understands this clearly. 60 minutes spent, 30 minutes direct face-to-face with this patient  A copy of this report sent to [employer] PA-C

## 2014-07-11 NOTE — Patient Instructions (Addendum)
You have been scheduled for a virtual colonoscopy at [facility] on 07/19/2014 at 10:00am.  Please arrive at 9:40am.  Should you need to reschedule, their number is [phone].    Use Miralax (glycolax) over the counter as discussed with Dr. Henrene Pastor.  Please follow up with Dr. Henrene Pastor in 6 months.

## 2014-07-19 ENCOUNTER — Ambulatory Visit
Admission: RE | Admit: 2014-07-19 | Discharge: 2014-07-19 | Disposition: A | Discharge status: Home / Self Care | Payer: Commercial Managed Care - PPO | Source: Ambulatory Visit | Attending: Internal Medicine | Admitting: Internal Medicine

## 2014-07-19 DIAGNOSIS — K56609 Unspecified intestinal obstruction, unspecified as to partial versus complete obstruction: Secondary | ICD-10-CM

## 2015-01-08 ENCOUNTER — Encounter: Payer: Self-pay | Admitting: Internal Medicine

## 2015-02-06 ENCOUNTER — Other Ambulatory Visit: Payer: Self-pay

## 2015-02-06 DIAGNOSIS — Z1231 Encounter for screening mammogram for malignant neoplasm of breast: Secondary | ICD-10-CM

## 2015-03-12 ENCOUNTER — Ambulatory Visit (INDEPENDENT_AMBULATORY_CARE_PROVIDER_SITE_OTHER): Payer: Commercial Managed Care - PPO | Admitting: Internal Medicine

## 2015-03-12 ENCOUNTER — Encounter: Payer: Self-pay | Admitting: Internal Medicine

## 2015-03-12 ENCOUNTER — Ambulatory Visit
Admission: RE | Admit: 2015-03-12 | Discharge: 2015-03-12 | Disposition: A | Discharge status: Home / Self Care | Payer: Commercial Managed Care - PPO | Source: Ambulatory Visit

## 2015-03-12 VITALS — BP 116/78 | HR 76 | Ht 61.0 in | Wt 188.0 lb

## 2015-03-12 DIAGNOSIS — K5660 Unspecified intestinal obstruction: Secondary | ICD-10-CM | POA: Diagnosis not present

## 2015-03-12 DIAGNOSIS — K56609 Unspecified intestinal obstruction, unspecified as to partial versus complete obstruction: Secondary | ICD-10-CM

## 2015-03-12 DIAGNOSIS — K56699 Other intestinal obstruction unspecified as to partial versus complete obstruction: Secondary | ICD-10-CM

## 2015-03-12 DIAGNOSIS — K5909 Other constipation: Secondary | ICD-10-CM

## 2015-03-12 DIAGNOSIS — K5669 Other intestinal obstruction: Secondary | ICD-10-CM | POA: Diagnosis not present

## 2015-03-12 DIAGNOSIS — R935 Abnormal findings on diagnostic imaging of other abdominal regions, including retroperitoneum: Secondary | ICD-10-CM | POA: Diagnosis not present

## 2015-03-12 DIAGNOSIS — Z1231 Encounter for screening mammogram for malignant neoplasm of breast: Secondary | ICD-10-CM

## 2015-03-12 NOTE — Patient Instructions (Signed)
You have been scheduled for an appointment with Dr. Excell Seltzer at [facility] on 04/19/2015 at 9:15am, arrive at 8:45am.  If you need to reschedule this appointment the number is [phone].  The address is [address], [place]

## 2015-03-12 NOTE — Progress Notes (Signed)
HISTORY OF PRESENT ILLNESS:  Cathy Jones is a 54 y.o. female with hypertension, hyperlipidemia, and a remote history of cervical cancer for which she underwent total abdominal hysterectomy with lymph node dissection followed by radiation therapy (proximal 30 years ago). The patient was sent to me in January 2016 for routine screening colonoscopy. She was found to have a high-grade distal sigmoid stricture not permitting passage of the endoscope. This was located at approximately 20 cm from the anal verge. Biopsies were negative. She was brought back 2 weeks later with more aggressive bowel preparation. The stricture was unsuccessfully balloon dilated (firm and noncompliant stricture) and would not permit passage of a small diameter endoscope beyond. Biopsies were again negative. Diminutive rectal polyps removed previously. Subsequent virtual colonoscopy revealed short segment sigmoid colon stricture without other abnormality. Patient was subsequently seen in the office 07/11/2014. In retrospect she reported problems with constipation, abdominal bloating, and intermittent episodes of vomiting. She presents today for routine follow-up as requested. She is accompanied by her husband. She has been using MiraLAX for her bowels. She states this helps. However, she continues with intermittent abdominal distention and episodes of vomiting (generally liquids) approximate 3 times per week. Since her last visit in March she has lost 28 pounds. She feels that her difficulties are worse when she eats solid foods or lettuce and has been limiting her diet to liquids and tuna fish. No additional issues or problems to report. Her interval medical history has been stable. She continues to smoke  REVIEW OF SYSTEMS:  All non-GI ROS negative except for cough  Past Medical History  Diagnosis Date  . Hypertension   . Hyperlipidemia   . Cervical cancer ([place])   . Asthma     pt brought her proair inhaler 06-06-14  . Colon  polyp     hyperplastic  . Colon stricture ([place])   . Colonic obstruction [facility])     Past Surgical History  Procedure Laterality Date  . Abdominal hysterectomy  1994  . Colonoscopy    . Upper gastrointestinal endoscopy      Social History Cathy Jones  reports that she has been smoking Cigarettes.  She has been smoking about 0.50 packs per day. She has never used smokeless tobacco. She reports that she drinks alcohol. She reports that she does not use illicit drugs.  family history includes Rectal cancer in her cousin. There is no history of Colon cancer, Stomach cancer, or Esophageal cancer.  Allergies  Allergen Reactions  . Codeine Other (See Comments)    "hyper"       PHYSICAL EXAMINATION: Vital signs: BP 116/78 mmHg  Pulse 76  Ht 5\' 1"  (1.549 m)  Wt 188 lb (85.276 kg)  BMI 35.54 kg/m2 General: Well-developed, obese, unhealthy appearing,  no acute distress HEENT: Sclerae are anicteric, conjunctiva pink. Oral mucosa intact Lungs: Clear Heart: Regular Abdomen: soft, obese, nontender, nondistended, no obvious ascites, no peritoneal signs, normal bowel sounds. No organomegaly. Prior surgical incision midline well-healed Extremities: No clubbing cyanosis or edema Psychiatric: alert and oriented x3. Cooperative  ASSESSMENT:  #1. High-grade distal rectosigmoid stricture secondary to previous total hysterectomy followed by radiation therapy for cervical cancer. She seems be symptomatic despite MiraLAX therapy. Experiencing ongoing problems with intermittent vomiting, presumably secondary to partial obstruction. Associated significant weight loss #2. Diminutive rectal polyps. One hyperplastic. Other not retrieved (lost in stool), appeared to be a simple adenoma  PLAN:  #1. Continue MiraLAX #2. General surgical opinion with Dr. Excell Seltzer regarding  the feasibility of segmental resection of the symptomatic distal rectosigmoid stricture with possible primary  anastomosis  25 minutes was spent face-to-face with the patient. Greater than 50% of the time used for reviewing her condition and counseling regarding the pathophysiology of her symptoms and recommended treatment options. As well answering multiple questions from her and her husband

## 2015-04-11 IMAGING — CR DG WRIST COMPLETE 3+V*L*
4 series · 4 of 4 positions shown · non-contrast
Comparison: Four of series of today's date

CLINICAL DATA: Forearm and wrist pain status post fall.

EXAM:
LEFT WRIST - COMPLETE 3+ VIEW

[x wrist pa left]
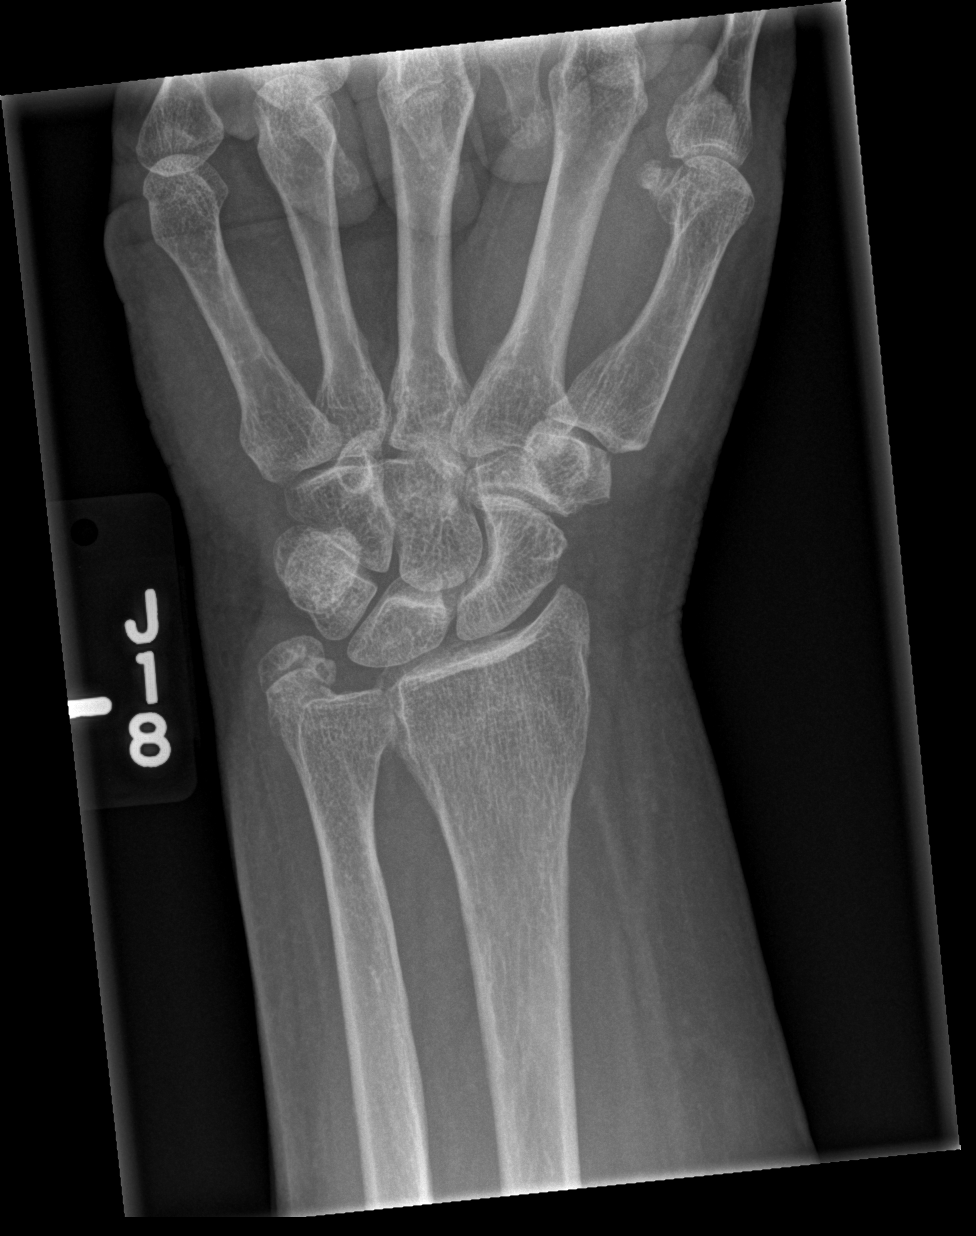

[x wrist obl left]
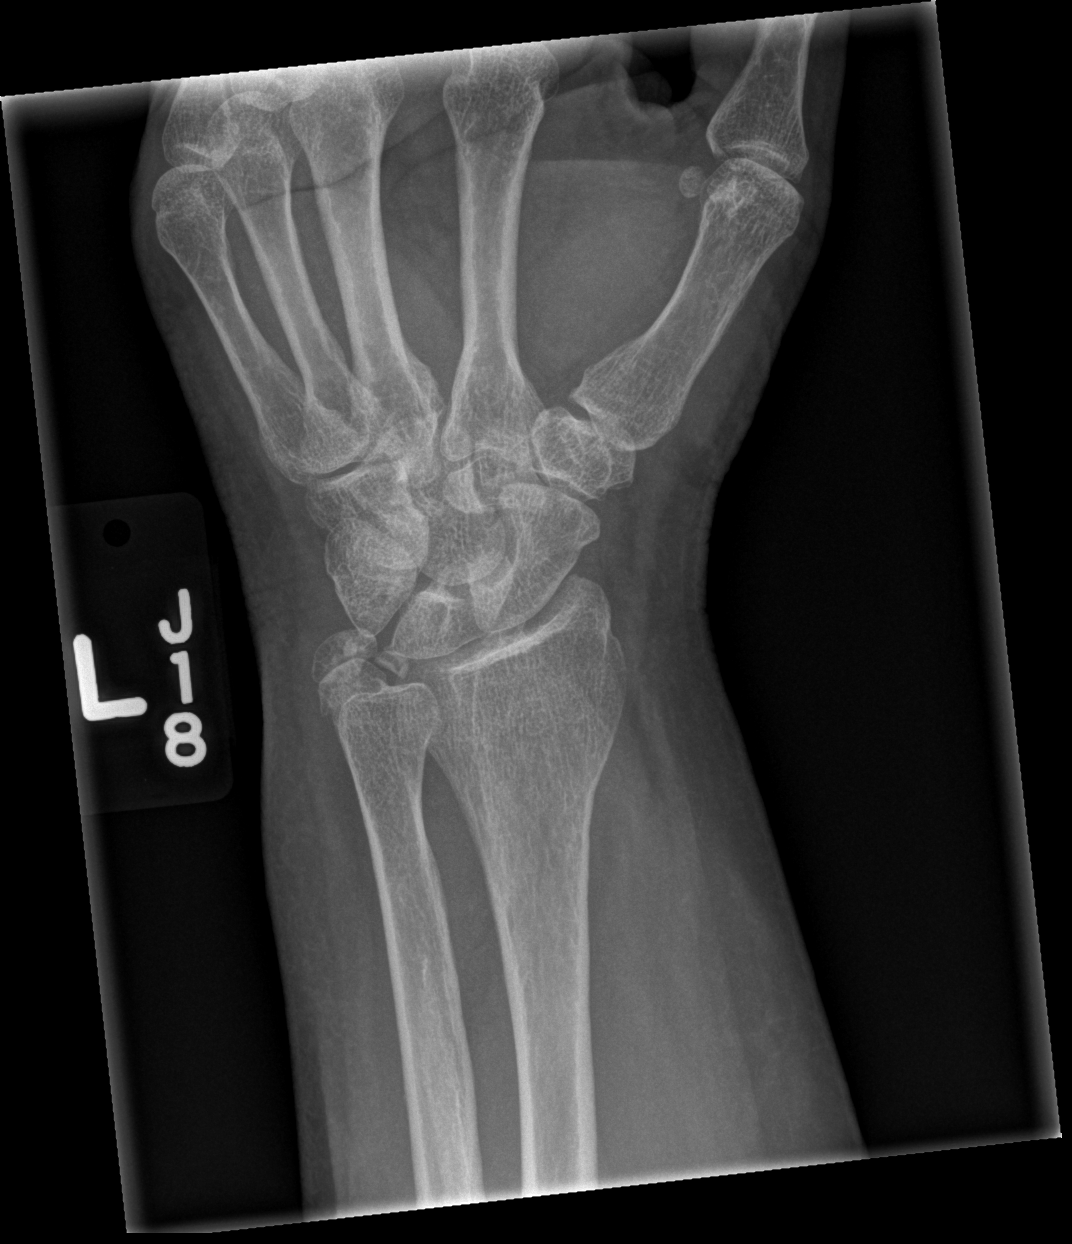

[x wrist lat left]
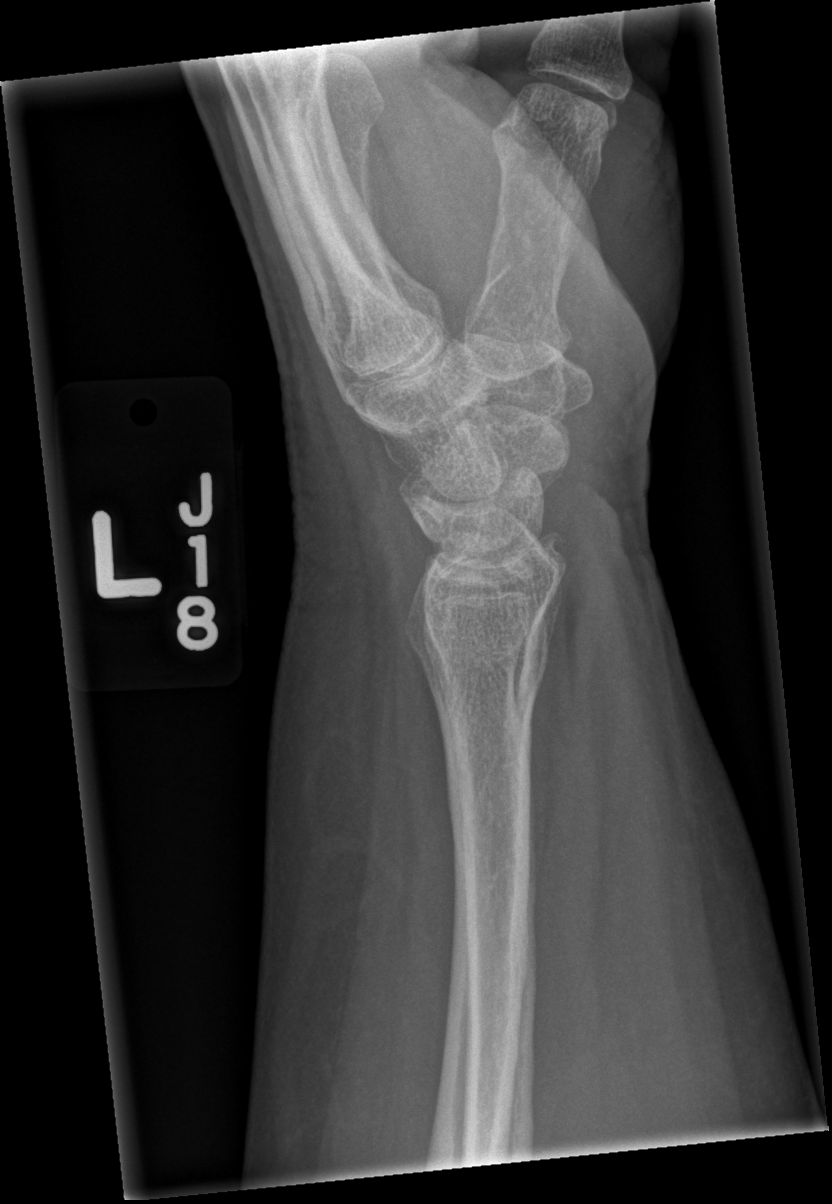

[x wrist navicular view left]
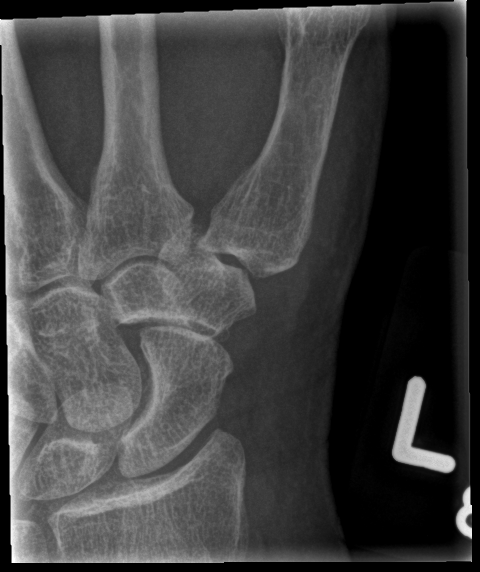

[4 of 4 positions shown; findings below may reference images not displayed]

FINDINGS: The distal radius is intact. There is old deformity of the ulnar
styloid. The carpal bones are intact. There is mild degenerative
change of the radiocarpal and ulnocarpal joints. Mild degenerative
change of the first carpometacarpal joint is present. The scaphoid
is intact. There is mild diffuse soft tissue swelling.
IMPRESSION: There is no acute bony abnormality of the left wrist. There are
osteoarthritic changes present.

## 2015-04-11 IMAGING — CR DG FOREARM 2V*L*
2 series · 2 of 2 positions shown · non-contrast
Comparison: Wrist series of today's date

CLINICAL DATA: Status post fall with proximal forearm pain.

EXAM:
LEFT FOREARM - 2 VIEW

[x forearm ap left]
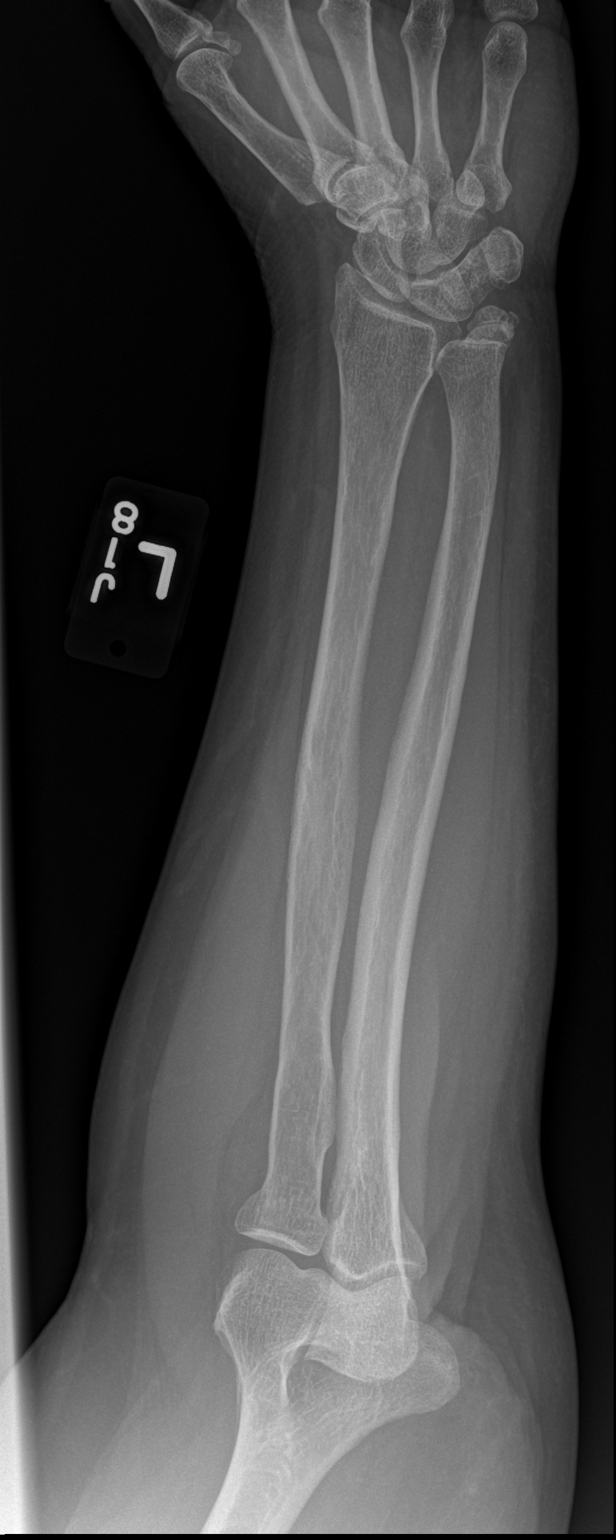

[x forearm lat left]
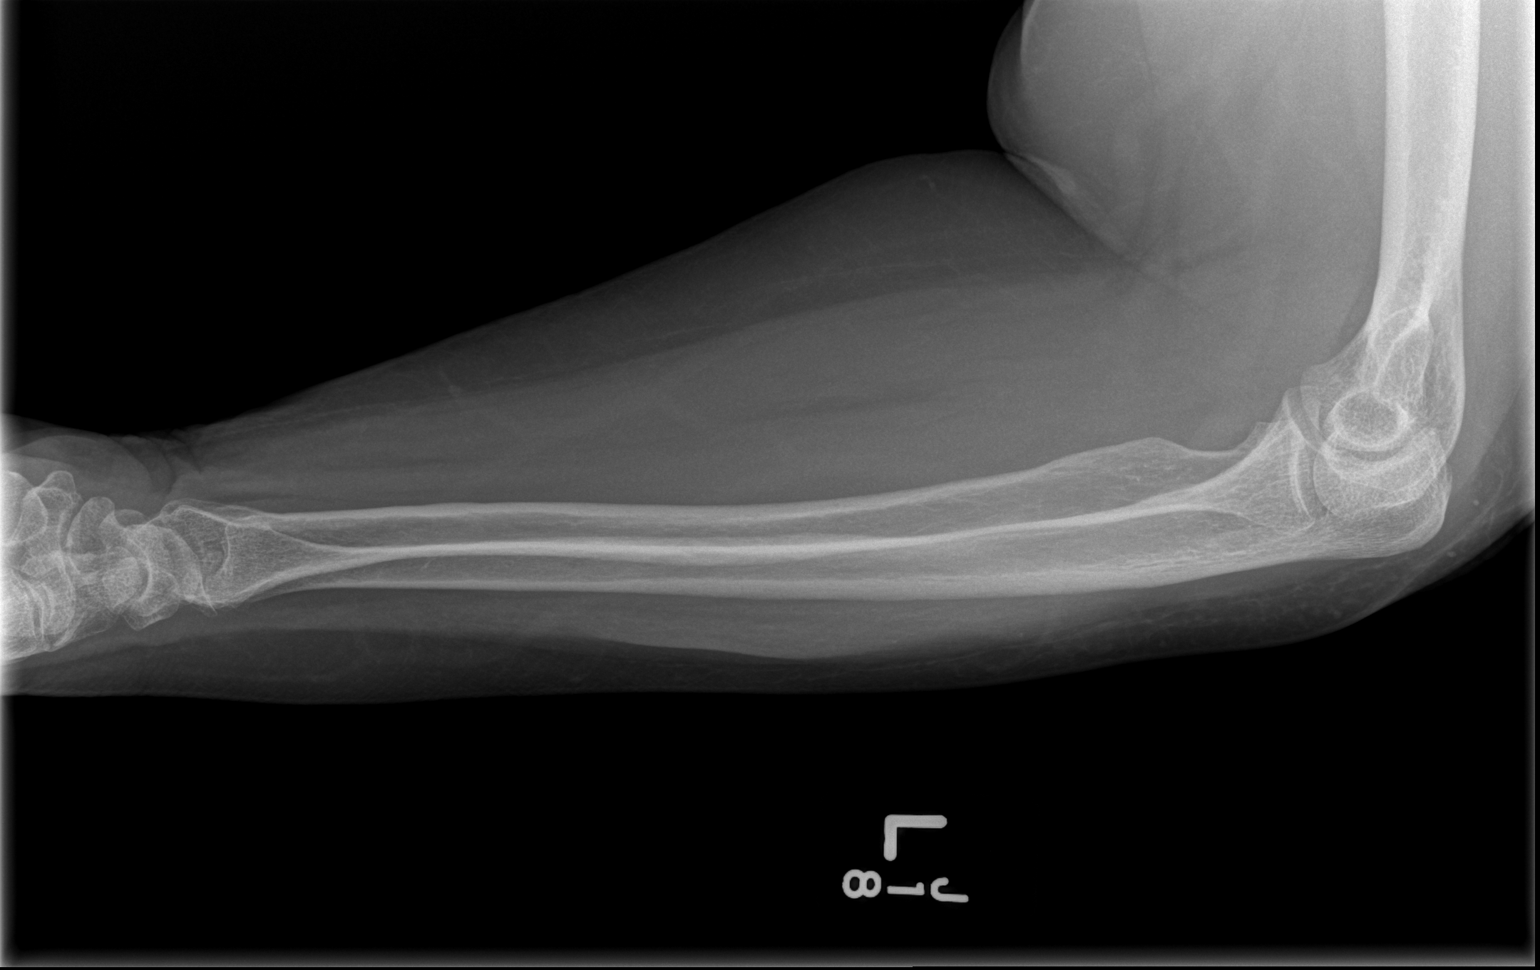

[2 of 2 positions shown; findings below may reference images not displayed]

FINDINGS: The shafts of the left radius and ulna are intact. As best as can be
determined there is no radial head or olecranon fracture. No
posterior fat pad sign is demonstrated. The distal radius and ulna
also appear intact though are chronic changes associated with the
ulnar styloid.
IMPRESSION: There is no acute bony abnormality of the left forearm. If there are
significant symptoms referable to the elbow, a dedicated elbow
series would be useful.

## 2015-04-11 IMAGING — CR DG RIBS W/ CHEST 3+V*L*
3 series · 3 of 3 positions shown · non-contrast
Comparison: None.

CLINICAL DATA: Status post fall

EXAM:
LEFT RIBS AND CHEST - 3+ VIEW

[w chest pa]
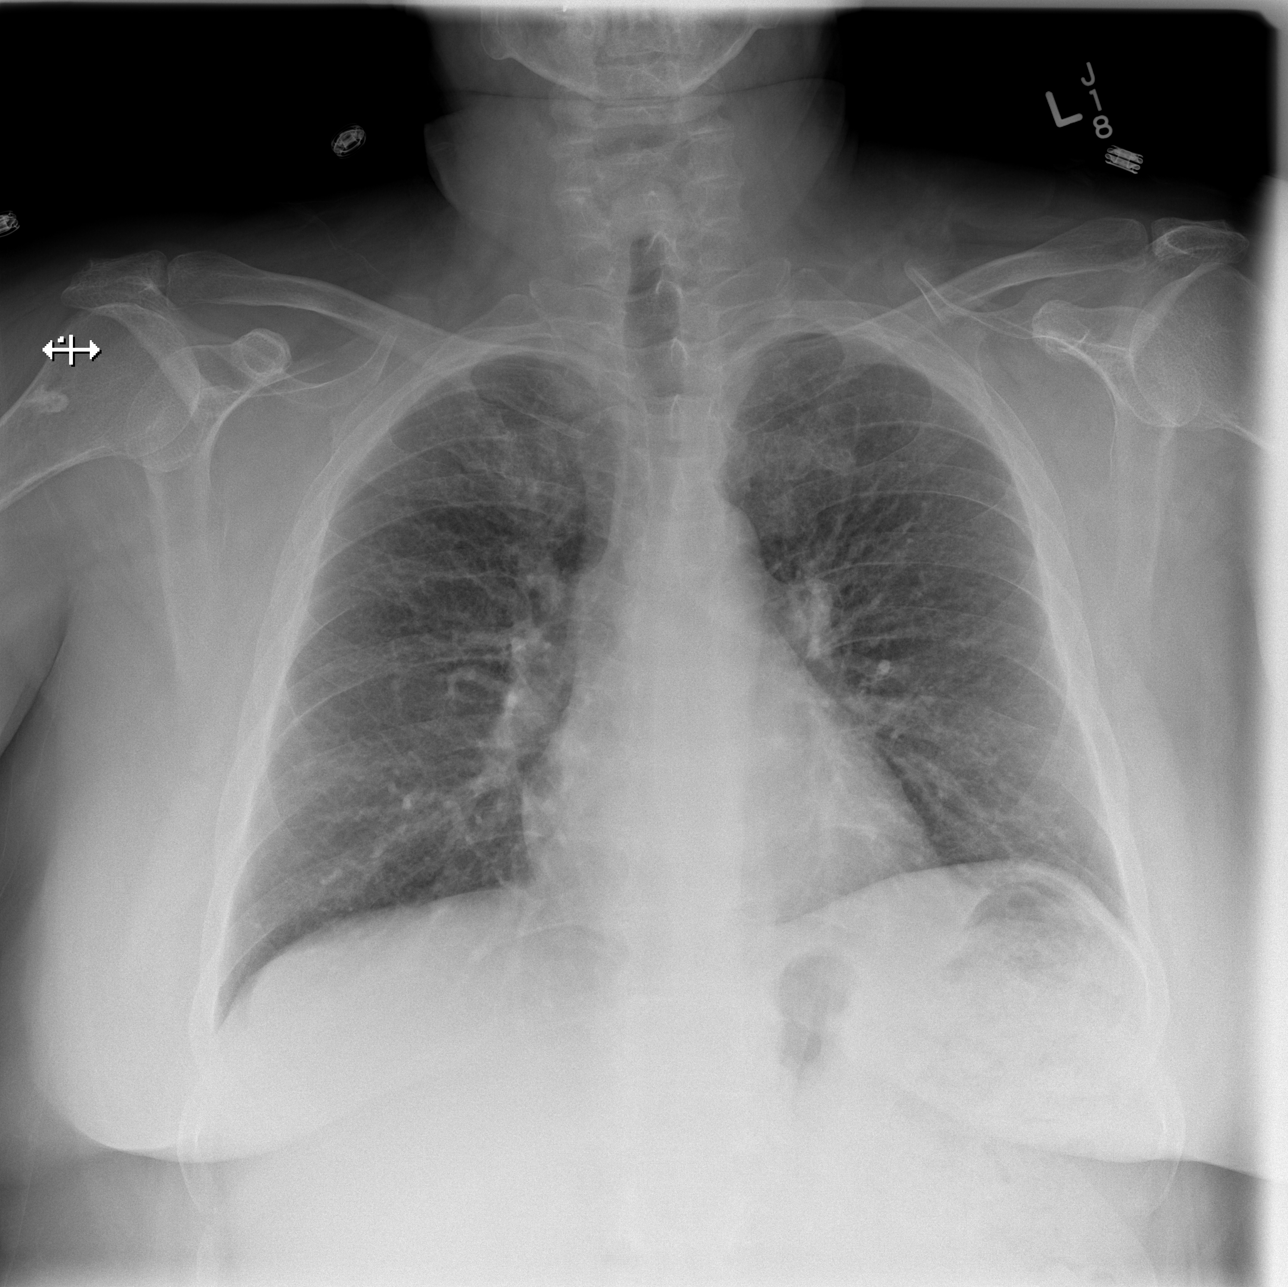

[w ribs ap lower left]
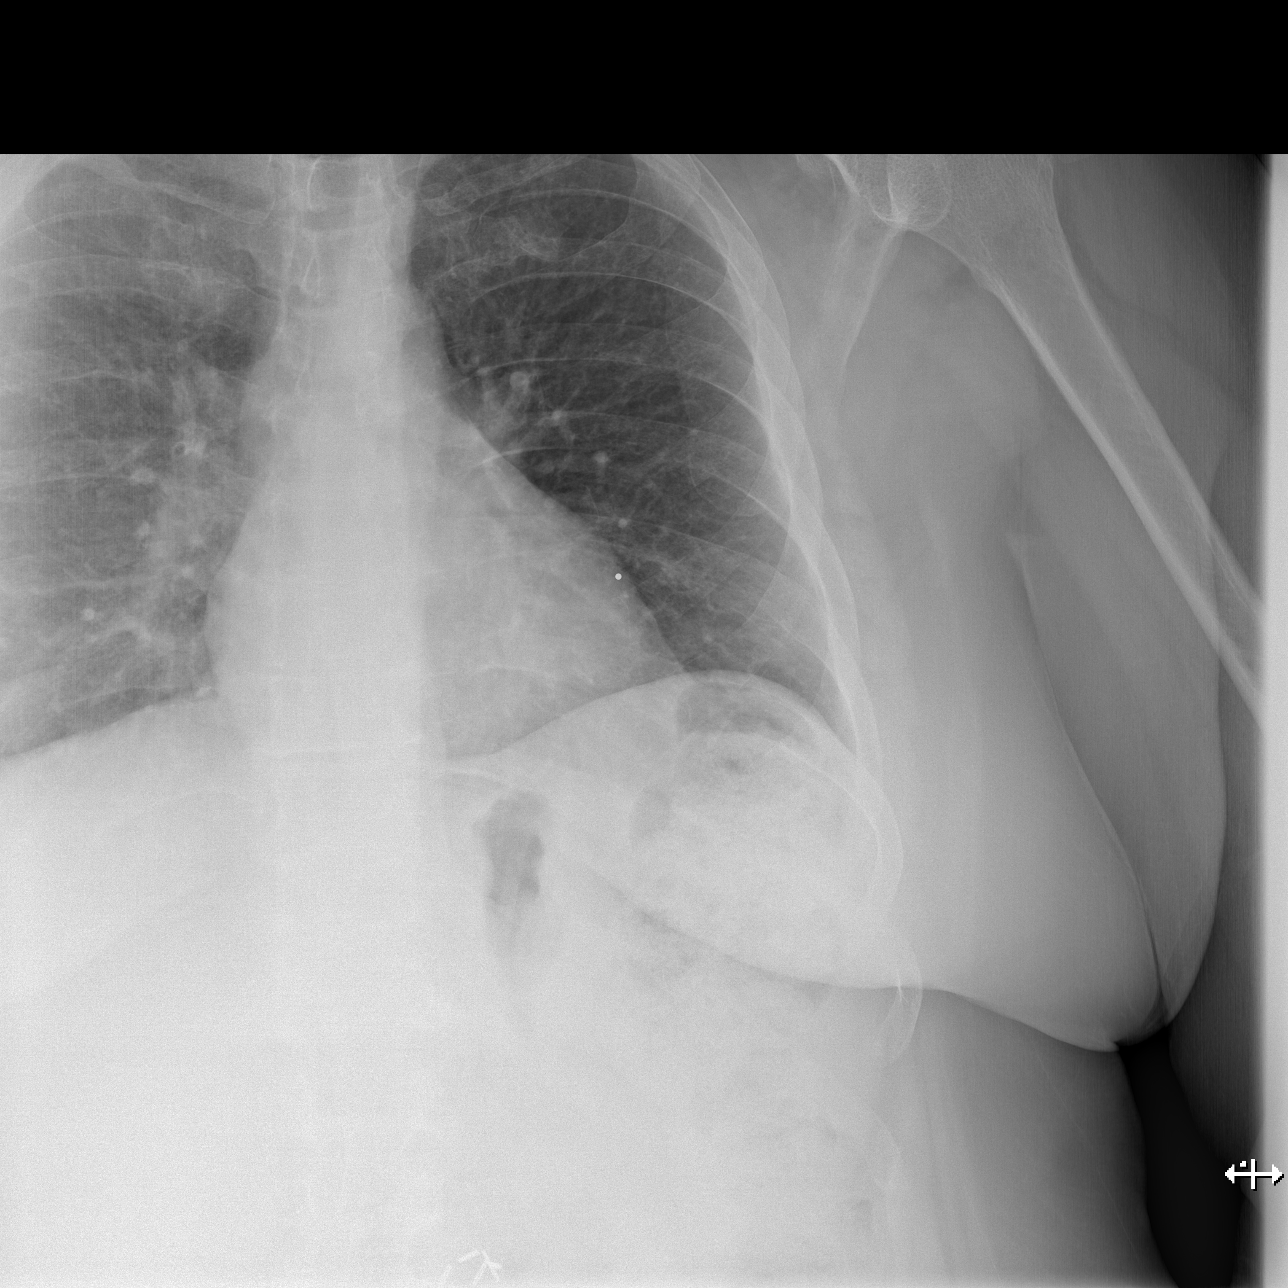

[w ribs obl left]
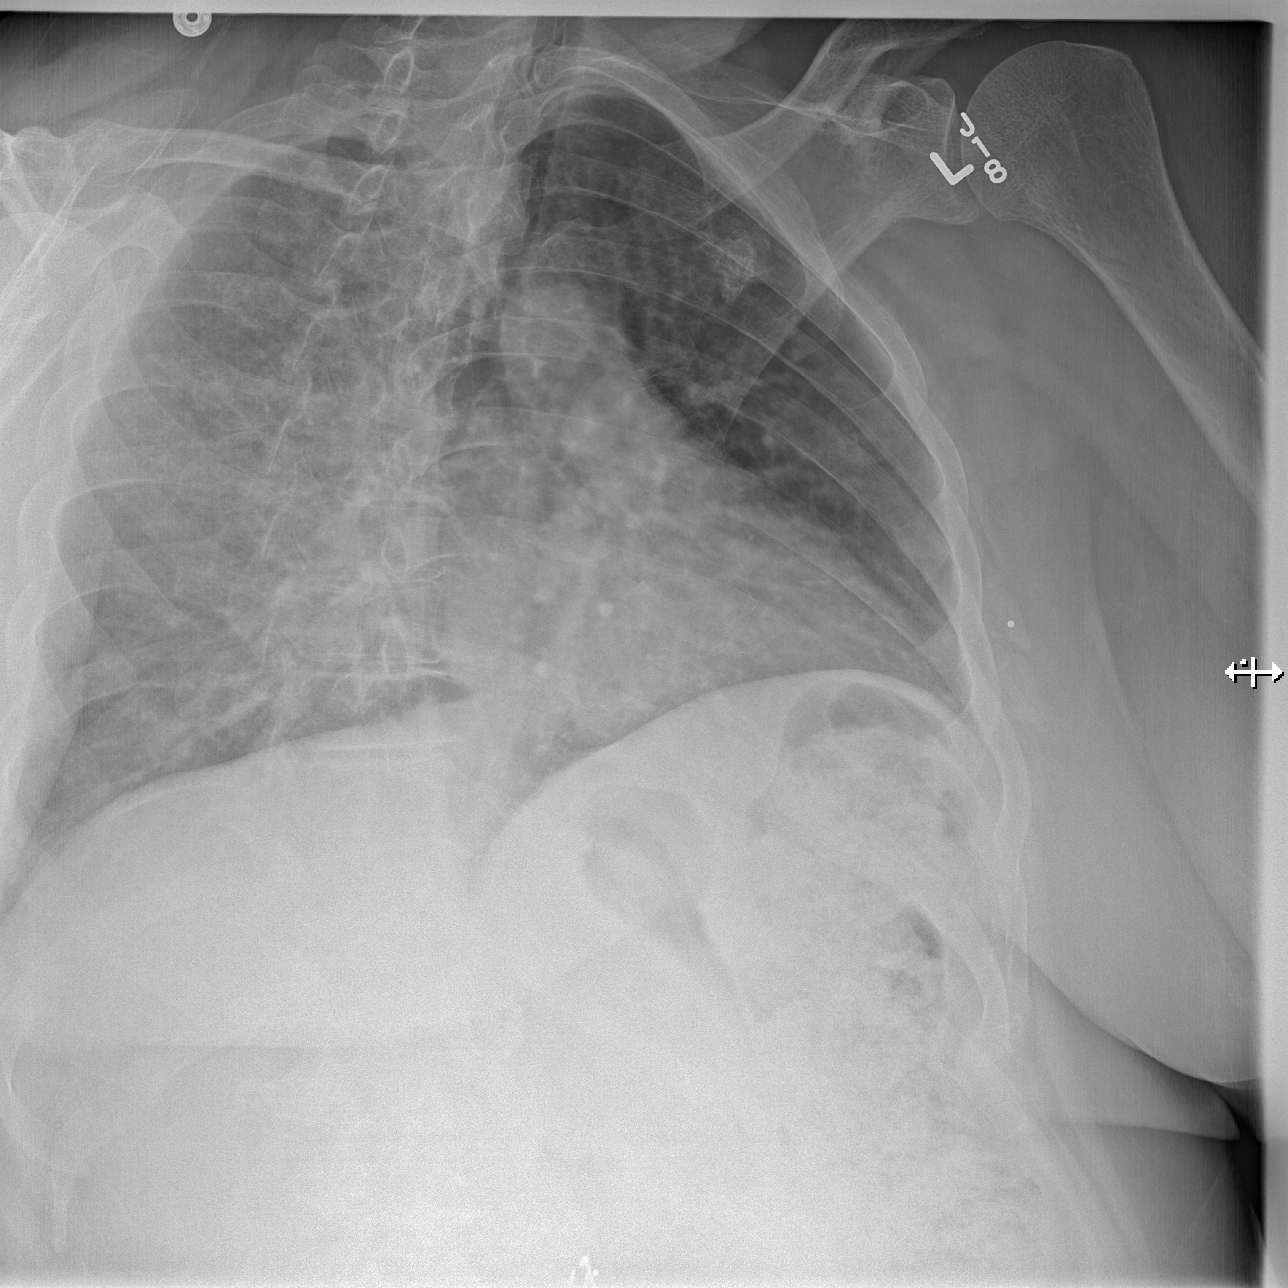

[3 of 3 positions shown; findings below may reference images not displayed]

FINDINGS: No fracture or other bone lesions are seen involving the ribs. There
is no evidence of pneumothorax or pleural effusion. Mild bilateral
interstitial prominence low, likely chronic. No focal parenchymal
opacity. Heart size and mediastinal contours are within normal
limits.
IMPRESSION: No acute osseous injury of the left ribs.

## 2015-04-23 ENCOUNTER — Other Ambulatory Visit: Payer: Self-pay | Admitting: General Surgery

## 2015-04-23 NOTE — H&P (Signed)
History of Present Illness Leighton Ruff MD; [id number] 10:29 AM) Patient words: 2nd opinion.  The patient is a 54 year old female who presents with colonic obstruction. Patient is referred by Dr. Scarlette Shorts due to a colonic stricture. She has a history of total abdominal hysterectomy with pelvic lymphadenectomy for cervical cancer in 1994. This was followed with chemotherapy and radiation. She has not had any evidence of recurrence. For about 2 years however she has had increasing problems with abdominal bloating and difficulty with bowel movements. She would notice abdominal swelling and pain and also vomiting which she ate solid food and particularly vegetables. She underwent initially a screening colonoscopy with findings of a distal sigmoid stricture at about 20-25 cm. She subsequently has had repeat colonoscopy confirming this and biopsy showing benign ulceration and inflammatory change. She has had a virtual colonoscopy and CT of the abdomen and pelvis both of which reveal a short segment stricture in the sigmoid colon at approximately 23 cm. She underwent attempted dilatation in January of this year but quickly developed recurrent symptoms. She has been managed on an essentially a liquid diet and MiraLAX since that time and as long as she stays on this her problems have improved but she is unable to tolerate a solid diet. She has lost approximately 100 pounds over the last 2 years without dieting. There is no blood in her stools.   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 Migraine Headache Gastroesophageal Reflux Disease Anxiety Disorder Cervical Cancer Cancer  Past Surgical History Leighton Ruff, MD; Q000111Q 10:29 AM) Sentinel Lymph  Node Biopsy Oral Surgery Hysterectomy (due to cancer) - Complete Colon Polyp Removal - Colonoscopy  Diagnostic Studies History Leighton Ruff, MD; Q000111Q 10:29 AM) Mammogram within last year Colonoscopy within last year Pap Smear 1-5 years ago  Allergies Leighton Ruff, MD; Q000111Q 10:29 AM) Codeine Sulfate *ANALGESICS - OPIOID*  Medication History Leighton Ruff, MD; Q000111Q 10:29 AM) Medications Reconciled Neomycin Sulfate (500MG  Tablet, 2 (two) Tablet Oral SEE NOTE, Taken starting 04/23/2015) Active. (TAKE TWO TABLETS AT 2 PM, 3 PM, AND 10 PM THE DAY PRIOR TO SURGERY) Flagyl (500MG  Tablet, 2 (two) Tablet Oral SEE NOTE, Taken starting 04/23/2015) Active. (Take at 2pm, 3pm, and 10pm the day prior to your colon operation) LORazepam (0.5MG  Tablet, Oral) Active. Azor (5-20MG  Tablet, Oral) Active. ProAir HFA (108 (90 Base)MCG/ACT Aerosol Soln, Inhalation) Active. Sertraline HCl (100MG  Tablet, Oral) Active. Atorvastatin Calcium (10MG  Tablet, Oral) Active.  Social History Leighton Ruff, MD; Q000111Q 10:29 AM) Caffeine use Coffee, Tea. Alcohol use Remotely quit alcohol use. Tobacco use Current every day smoker. No drug use  Family History Leighton Ruff, MD; Q000111Q 10:29 AM) Colon Polyps Father, Mother. Arthritis Father, Mother. Thyroid problems Father. Migraine Headache Daughter, Mother. Diabetes Mellitus Father. Depression Daughter, Mother. Melanoma Father. Hypertension Mother.  Pregnancy / Birth History Leighton Ruff, MD; Q000111Q 10:29 AM) Regular periods Para 1 Maternal age 73-25 Age of menopause <45 Age at menarche 56 years. Gravida 1 Contraceptive History Oral contraceptives.     Review of Systems Leighton Ruff MD; Q000111Q 10:29 AM) General Present- Weight Loss. Not Present- Appetite Loss, Chills, Fatigue, Fever, Night Sweats and Weight Gain. Skin Not Present- Change in Wart/Mole, Dryness, Hives,  Jaundice, New Lesions, Non-Healing Wounds, Rash and Ulcer. HEENT Present- Seasonal Allergies and Wears glasses/contact lenses. Not Present- Earache, Hearing Loss,  Hoarseness, Nose Bleed, Oral Ulcers, Ringing in the Ears, Sinus Pain, Sore Throat, Visual Disturbances and Yellow Eyes. Respiratory Present- Wheezing. Not Present- Bloody sputum, Chronic Cough, Difficulty Breathing and Snoring. Breast Not Present- Breast Mass, Breast Pain, Nipple Discharge and Skin Changes. Cardiovascular Present- Swelling of Extremities. Not Present- Chest Pain, Difficulty Breathing Lying Down, Leg Cramps, Palpitations, Rapid Heart Rate and Shortness of Breath. Gastrointestinal Present- Bloating, Nausea and Vomiting. Not Present- Abdominal Pain, Bloody Stool, Change in Bowel Habits, Chronic diarrhea, Constipation, Difficulty Swallowing, Excessive gas, Gets full quickly at meals, Hemorrhoids, Indigestion and Rectal Pain. Female Genitourinary Present- Urgency. Not Present- Frequency, Nocturia, Painful Urination and Pelvic Pain. Psychiatric Present- Anxiety and Change in Sleep Pattern. Not Present- Bipolar, Depression, Fearful and Frequent crying. Hematology Present- Easy Bruising. Not Present- Excessive bleeding, Gland problems, HIV and Persistent Infections.  Vitals (Alisha Spillers CMA; 04/23/2015 9:52 AM) 04/23/2015 9:51 AM Weight: 190 lb Height: 62in Body Surface Area: 1.87 m Body Mass Index: 34.75 kg/m  Temp.: 98.34F(Oral)  Pulse: 78 (Regular)  BP: 140/82 (Sitting, Left Arm, Standard)      Physical Exam Leighton Ruff MD; Q000111Q 10:29 AM)  The physical exam findings are as follows: Note:General: Alert, mildly overweight Caucasian female, in no distress Skin: Warm and dry without rash or infection. HEENT: No palpable masses or thyromegaly. Sclera nonicteric. Pupils equal round and reactive. Oropharynx clear. Lymph nodes: No cervical, supraclavicular, or inguinal nodes palpable. Lungs:  Breath sounds clear and equal. No wheezing or increased work of breathing. Cardiovascular: Regular rate and rhythm without murmer. No JVD or edema. Abdomen: Nondistended. Healed low midline incision without hernias. Soft with minimal diffuse tenderness and no guarding. No masses palpable. Extremities: No edema or joint swelling or deformity. No chronic venous stasis changes. Neurologic: Alert and fully oriented. Gait normal. No focal weakness. Psychiatric: Normal mood and affect. 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 She has underwent endoscopic dilation with no improvement in her symptoms. She is now on a low residue diet and tolerating this somewhat. On CT scan it appears that her sigmoid colon is adherent to both pelvic sidewalls and fibrotic. We discussed that she will most likely need an open surgery with careful pelvic dissection and removal of the inflamed portion of colon. I would then perform an anastomosis to the rectum and a diverting loop ileostomy. She would need bilateral ureteral stents prior to surgery. She would need ostomy marking prior to surgery as well. The patient states that she will stop smoking. We will get a urine nicotine and a couple weeks and evaluate this. If that is negative, we will schedule her for surgery. We will also get a urine nicotine on the morning of surgery for confirmation. The surgery and anatomy were described to the patient as well as the risks of surgery and the possible complications. These include: Bleeding, deep abdominal infections and possible wound complications such as hernia and infection, damage to adjacent structures, leak of surgical connections, which can lead to other surgeries and possibly an  ostomy, possible need for other procedures, such as abscess drains in radiology, possible prolonged hospital stay, possible diarrhea from removal of part of the colon, possible constipation from narcotics, possible bowel, bladder or sexual dysfunction if having rectal surgery, prolonged fatigue/weakness or appetite loss, possible  early recurrence of of disease, possible complications of their medical problems such as heart disease or arrhythmias or lung problems, death (less than 1%). I believe the patient understands and wishes to proceed with the surgery.

## 2015-05-11 ENCOUNTER — Other Ambulatory Visit: Payer: Self-pay | Admitting: General Surgery

## 2015-05-11 DIAGNOSIS — K56699 Other intestinal obstruction unspecified as to partial versus complete obstruction: Secondary | ICD-10-CM

## 2015-05-30 ENCOUNTER — Other Ambulatory Visit: Payer: Self-pay | Admitting: Urology

## 2015-06-04 ENCOUNTER — Other Ambulatory Visit: Payer: Self-pay | Admitting: Urology

## 2015-06-08 DIAGNOSIS — E669 Obesity, unspecified: Secondary | ICD-10-CM | POA: Insufficient documentation

## 2015-06-08 DIAGNOSIS — E7800 Pure hypercholesterolemia, unspecified: Secondary | ICD-10-CM | POA: Insufficient documentation

## 2015-06-08 DIAGNOSIS — J45909 Unspecified asthma, uncomplicated: Secondary | ICD-10-CM | POA: Insufficient documentation

## 2015-06-08 DIAGNOSIS — G43909 Migraine, unspecified, not intractable, without status migrainosus: Secondary | ICD-10-CM | POA: Insufficient documentation

## 2015-06-08 DIAGNOSIS — J309 Allergic rhinitis, unspecified: Secondary | ICD-10-CM | POA: Insufficient documentation

## 2015-06-08 DIAGNOSIS — I10 Essential (primary) hypertension: Secondary | ICD-10-CM | POA: Insufficient documentation

## 2015-06-08 DIAGNOSIS — F419 Anxiety disorder, unspecified: Secondary | ICD-10-CM | POA: Insufficient documentation

## 2015-06-08 DIAGNOSIS — R7309 Other abnormal glucose: Secondary | ICD-10-CM | POA: Insufficient documentation

## 2015-06-21 ENCOUNTER — Encounter (HOSPITAL_COMMUNITY)
Admission: RE | Admit: 2015-06-21 | Discharge: 2015-06-21 | Disposition: A | Discharge status: Home / Self Care | Payer: Commercial Managed Care - PPO | Source: Ambulatory Visit | Attending: General Surgery | Admitting: General Surgery

## 2015-06-21 ENCOUNTER — Encounter (HOSPITAL_COMMUNITY): Payer: Self-pay

## 2015-06-21 VITALS — BP 132/65 | HR 74 | Temp 98.3°F | Resp 16 | Ht 62.0 in | Wt 214.5 lb

## 2015-06-21 DIAGNOSIS — Z01818 Encounter for other preprocedural examination: Secondary | ICD-10-CM | POA: Insufficient documentation

## 2015-06-21 DIAGNOSIS — K5669 Other intestinal obstruction: Secondary | ICD-10-CM | POA: Insufficient documentation

## 2015-06-21 DIAGNOSIS — R9431 Abnormal electrocardiogram [ECG] [EKG]: Secondary | ICD-10-CM | POA: Insufficient documentation

## 2015-06-21 DIAGNOSIS — Z01812 Encounter for preprocedural laboratory examination: Secondary | ICD-10-CM | POA: Diagnosis not present

## 2015-06-21 DIAGNOSIS — Z0183 Encounter for blood typing: Secondary | ICD-10-CM | POA: Diagnosis not present

## 2015-06-21 DIAGNOSIS — K56699 Other intestinal obstruction unspecified as to partial versus complete obstruction: Secondary | ICD-10-CM

## 2015-06-21 HISTORY — DX: Major depressive disorder, single episode, unspecified: F32.9

## 2015-06-21 HISTORY — DX: Family history of other specified conditions: Z84.89

## 2015-06-21 HISTORY — DX: Depression, unspecified: F32.A

## 2015-06-21 HISTORY — DX: Personal history of other diseases of the respiratory system: Z87.09

## 2015-06-21 HISTORY — DX: Reserved for inherently not codable concepts without codable children: IMO0001

## 2015-06-21 HISTORY — DX: Personal history of other diseases of the digestive system: Z87.19

## 2015-06-21 LAB — ABO/RH: ABO/RH(D): A POS

## 2015-06-21 LAB — CBC
HEMATOCRIT: 42.1 % (ref 36.0–46.0)
HEMOGLOBIN: 13.1 g/dL (ref 12.0–15.0)
MCH: 27.2 pg (ref 26.0–34.0)
MCHC: 31.1 g/dL (ref 30.0–36.0)
MCV: 87.3 fL (ref 78.0–100.0)
Platelets: 233 10*3/uL (ref 150–400)
RBC: 4.82 MIL/uL (ref 3.87–5.11)
RDW: 14.2 % (ref 11.5–15.5)
WBC: 8.4 10*3/uL (ref 4.0–10.5)

## 2015-06-21 NOTE — Consult Note (Signed)
WOC ostomy consult note Patient seen per Dr. Marcello Moores' request for assessment of abdomen and preoperative stoma site selection.  Surgery date is Wednesday, 06/27/15.  Mildly obese abdomen with evidence of previous surgery at midline notes.  Right upper quadrant identified as the ideal location for a loop ileostomy; sit is prepped with HCG wipe and allowed to dry.  Site is marked with a surgical skin marking pen and covered with a thin film dressing.  Site is located in a flat plane within the rectus muscle and while slightly difficult for patient to see because of her breasts, will be easy to manage.  Higher placement of an ostomy would be much more visible and drainage of stool impeded by her elastic waist banded underwear and slacks. RUQ ileostomy site is 3cm above the umbilicus and [id number] to the right. 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 I explain today that an ileostomy is slightly different than a colostomy and she is aware of than and receptive to teaching post op.  She has confidence in the facility she chose, her surgeon and her assistance, Dr. Excell Seltzer, whom she has known for several years. Patient signes consent for [facility] today and takes home the educational materials pertaining to ostomy surgery.  I will not register her until I can assess the stoma and patient for fit and product formulation next week. Patient also has my contact information in the event she or her mother have questions prior to surgery on Wednesday. [facility] nursing team will follow, and will remain available to this patient, the nursing, surgical and medical teams.  Please re-consult on DOS.. Thanks, Maudie Flakes, MSN, RN, [employer], Arther Abbott  Pager# [phone]

## 2015-06-21 NOTE — Progress Notes (Signed)
A1C results on chart [id number] Metabolic panel per chart [id number]

## 2015-06-21 NOTE — Patient Instructions (Signed)
Cathy Jones  06/21/2015   Your procedure is scheduled on: Wednesday June 27, 2015  Report to [facility] Main  Entrance take [place]  elevators to 3rd floor to  [place] at 6:30 AM.  Call this number if you have problems the morning of surgery [phone]   Remember: ONLY 1 PERSON MAY GO WITH YOU TO SHORT STAY TO GET  READY MORNING OF [place].  Do not eat food or drink liquids :After Midnight.     Take these medicines the morning of surgery with A SIP OF WATER: Lorazepam (Ativan) if needed; Sertraline (Zoloft); May use Proair Inhaler if needed (Bring with you day of surgery); May use eye drops if needed (bring bottle with you day of surgery) DO NOT TAKE ANY DIABETIC MEDICATIONS DAY OF YOUR SURGERY                               You may not have any metal on your body including hair pins and              piercings  Do not wear jewelry, make-up, lotions, powders or perfumes, deodorant             Do not wear nail polish.  Do not shave  48 hours prior to surgery.             Do not bring valuables to the hospital. [place].  Contacts, dentures or bridgework may not be worn into surgery.  Leave suitcase in the car. After surgery it may be brought to your room.      Special Instructions: FOLLOW SURGEON'S INSTRUCTION IN REGARDS TO BOWEL PREPARATION PRIOR TO SURGICAL DATE;  No smoking or alcohol comsumption day of surgery               _____________________________________________________________________             [facility] - Preparing for Surgery Before surgery, you can play an important role.  Because skin is not sterile, your skin needs to be as free of germs as possible.  You can reduce the number of germs on your skin by washing with CHG (chlorahexidine gluconate) soap before surgery.  CHG is an antiseptic cleaner which kills germs and bonds with the skin to continue killing germs even  after washing. Please DO NOT use if you have an allergy to CHG or antibacterial soaps.  If your skin becomes reddened/irritated stop using the CHG and inform your nurse when you arrive at Short Stay. Do not shave (including legs and underarms) for at least 48 hours prior to the first CHG shower.  You may shave your face/neck. Please follow these instructions carefully:  1.  Shower with CHG Soap the night before surgery and the  morning of Surgery.  2.  If you choose to wash your hair, wash your hair first as usual with your  normal  shampoo.  3.  After you shampoo, rinse your hair and body thoroughly to remove the  shampoo.                           4.  Use CHG as you would any other liquid soap.  You can apply chg directly  to the skin and wash                       Gently with a scrungie or clean washcloth.  5.  Apply the CHG Soap to your body ONLY FROM THE NECK DOWN.   Do not use on face/ open                           Wound or open sores. Avoid contact with eyes, ears mouth and genitals (private parts).                       Wash face,  Genitals (private parts) with your normal soap.             6.  Wash thoroughly, paying special attention to the area where your surgery  will be performed.  7.  Thoroughly rinse your body with warm water from the neck down.  8.  DO NOT shower/wash with your normal soap after using and rinsing off  the CHG Soap.                9.  Pat yourself dry with a clean towel.            10.  Wear clean pajamas.            11.  Place clean sheets on your bed the night of your first shower and do not  sleep with pets. Day of Surgery : Do not apply any lotions/deodorants the morning of surgery.  Please wear clean clothes to the hospital/surgery center.  FAILURE TO FOLLOW THESE INSTRUCTIONS MAY RESULT IN THE CANCELLATION OF YOUR SURGERY PATIENT SIGNATURE_________________________________  NURSE  SIGNATURE__________________________________  ________________________________________________________________________

## 2015-06-25 LAB — NICOTINE AND METABS, URINE
Cotinine, Urine: NOT DETECTED ng/mL
NICOTINE, URINE: NOT DETECTED ng/mL

## 2015-06-26 NOTE — Anesthesia Preprocedure Evaluation (Addendum)
Anesthesia Evaluation  Patient identified by MRN, date of birth, ID band Patient awake    Reviewed: Allergy & Precautions, H&P , NPO status , Patient's Chart, lab work & pertinent test results  Airway Mallampati: II  TM Distance: >3 FB Neck ROM: full    Dental  (+) Dental Advisory Given, Caps All upper front are capped:   Pulmonary shortness of breath and with exertion, asthma , former smoker,    Pulmonary exam normal breath sounds clear to auscultation       Cardiovascular Exercise Tolerance: Good hypertension, Normal cardiovascular exam Rhythm:regular Rate:Normal     Neuro/Psych negative neurological ROS  negative psych ROS   GI/Hepatic negative GI ROS, Neg liver ROS, hiatal hernia,   Endo/Other  negative endocrine ROSMorbid obesity  Renal/GU negative Renal ROS  negative genitourinary   Musculoskeletal   Abdominal (+) + obese,   Peds  Hematology negative hematology ROS (+)   Anesthesia Other Findings Cervical cancer  Reproductive/Obstetrics negative OB ROS                            Anesthesia Physical Anesthesia Plan  ASA: III  Anesthesia Plan: General   Post-op Pain Management:    Induction: Intravenous  Airway Management Planned: Oral ETT  Additional Equipment:   Intra-op Plan:   Post-operative Plan: Extubation in OR  Informed Consent: I have reviewed the patients History and Physical, chart, labs and discussed the procedure including the risks, benefits and alternatives for the proposed anesthesia with the patient or authorized representative who has indicated his/her understanding and acceptance.   Dental Advisory Given  Plan Discussed with: CRNA  Anesthesia Plan Comments:         Anesthesia Quick Evaluation

## 2015-06-27 ENCOUNTER — Encounter (HOSPITAL_COMMUNITY): Payer: Self-pay | Admitting: *Deleted

## 2015-06-27 ENCOUNTER — Inpatient Hospital Stay (HOSPITAL_COMMUNITY): Payer: Commercial Managed Care - PPO | Admitting: Anesthesiology

## 2015-06-27 ENCOUNTER — Encounter (HOSPITAL_COMMUNITY)
Admission: RE | Disposition: A | Discharge status: Home / Self Care | Payer: Self-pay | Source: Ambulatory Visit | Attending: General Surgery

## 2015-06-27 ENCOUNTER — Inpatient Hospital Stay (HOSPITAL_COMMUNITY)
Admission: RE | Admit: 2015-06-27 | Discharge: 2015-07-02 | DRG: 331 | Disposition: A | Discharge status: Home / Self Care | Payer: Commercial Managed Care - PPO | Source: Ambulatory Visit | Attending: General Surgery | Admitting: General Surgery

## 2015-06-27 DIAGNOSIS — Z86010 Personal history of colonic polyps: Secondary | ICD-10-CM | POA: Diagnosis not present

## 2015-06-27 DIAGNOSIS — Z8541 Personal history of malignant neoplasm of cervix uteri: Secondary | ICD-10-CM | POA: Diagnosis not present

## 2015-06-27 DIAGNOSIS — Z6839 Body mass index (BMI) 39.0-39.9, adult: Secondary | ICD-10-CM | POA: Diagnosis not present

## 2015-06-27 DIAGNOSIS — K449 Diaphragmatic hernia without obstruction or gangrene: Secondary | ICD-10-CM | POA: Diagnosis present

## 2015-06-27 DIAGNOSIS — Z01812 Encounter for preprocedural laboratory examination: Secondary | ICD-10-CM

## 2015-06-27 DIAGNOSIS — N736 Female pelvic peritoneal adhesions (postinfective): Secondary | ICD-10-CM | POA: Diagnosis present

## 2015-06-27 DIAGNOSIS — K624 Stenosis of anus and rectum: Secondary | ICD-10-CM | POA: Diagnosis present

## 2015-06-27 DIAGNOSIS — F17200 Nicotine dependence, unspecified, uncomplicated: Secondary | ICD-10-CM | POA: Diagnosis present

## 2015-06-27 DIAGNOSIS — I10 Essential (primary) hypertension: Secondary | ICD-10-CM | POA: Diagnosis present

## 2015-06-27 DIAGNOSIS — Z90710 Acquired absence of both cervix and uterus: Secondary | ICD-10-CM | POA: Diagnosis not present

## 2015-06-27 DIAGNOSIS — Z923 Personal history of irradiation: Secondary | ICD-10-CM | POA: Diagnosis not present

## 2015-06-27 DIAGNOSIS — K5660 Unspecified intestinal obstruction: Secondary | ICD-10-CM | POA: Diagnosis present

## 2015-06-27 HISTORY — PX: COLON RESECTION: SHX5231

## 2015-06-27 HISTORY — PX: DIVERTING ILEOSTOMY: SHX5799

## 2015-06-27 HISTORY — PX: CYSTOSCOPY WITH STENT PLACEMENT: SHX5790

## 2015-06-27 LAB — TYPE AND SCREEN
ABO/RH(D): A POS
ANTIBODY SCREEN: NEGATIVE

## 2015-06-27 SURGERY — COLON RESECTION
Anesthesia: General | Site: Abdomen

## 2015-06-27 MED ORDER — DEXTROSE 5 % IV SOLN
2.0000 g | Freq: Two times a day (BID) | INTRAVENOUS | Status: AC
  Administered 2015-06-27: 2 g via INTRAVENOUS
  Filled 2015-06-27: qty 2

## 2015-06-27 MED ORDER — ALBUTEROL SULFATE HFA 108 (90 BASE) MCG/ACT IN AERS: 1.0000 | INHALATION_SPRAY | RESPIRATORY_TRACT | Status: DC | PRN

## 2015-06-27 MED ORDER — ONDANSETRON HCL 4 MG PO TABS: 4.0000 mg | ORAL_TABLET | Freq: Four times a day (QID) | ORAL | Status: DC | PRN

## 2015-06-27 MED ORDER — AMLODIPINE-OLMESARTAN 5-20 MG PO TABS: 1.0000 | ORAL_TABLET | Freq: Every morning | ORAL | Status: DC

## 2015-06-27 MED ORDER — HYDRALAZINE HCL 20 MG/ML IJ SOLN
INTRAMUSCULAR | Status: DC | PRN
  Administered 2015-06-27: 5 mg via INTRAVENOUS

## 2015-06-27 MED ORDER — LIDOCAINE HCL (CARDIAC) 20 MG/ML IV SOLN
INTRAVENOUS | Status: DC | PRN
  Administered 2015-06-27: 100 mg via INTRAVENOUS

## 2015-06-27 MED ORDER — HYDROMORPHONE HCL 1 MG/ML IJ SOLN: 0.2500 mg | INTRAMUSCULAR | Status: DC | PRN

## 2015-06-27 MED ORDER — ROCURONIUM BROMIDE 100 MG/10ML IV SOLN
INTRAVENOUS | Status: DC | PRN
  Administered 2015-06-27: 20 mg via INTRAVENOUS
  Administered 2015-06-27: 10 mg via INTRAVENOUS
  Administered 2015-06-27: 20 mg via INTRAVENOUS
  Administered 2015-06-27: 10 mg via INTRAVENOUS
  Administered 2015-06-27: 50 mg via INTRAVENOUS

## 2015-06-27 MED ORDER — ROCURONIUM BROMIDE 100 MG/10ML IV SOLN
INTRAVENOUS | Status: AC
  Filled 2015-06-27: qty 1

## 2015-06-27 MED ORDER — FENTANYL CITRATE (PF) 100 MCG/2ML IJ SOLN
INTRAMUSCULAR | Status: DC | PRN
Start: 2015-06-27 — End: 2015-06-27
  Administered 2015-06-27 (×4): 50 ug via INTRAVENOUS
  Administered 2015-06-27: 100 ug via INTRAVENOUS
  Administered 2015-06-27: 50 ug via INTRAVENOUS

## 2015-06-27 MED ORDER — IRBESARTAN 150 MG PO TABS
150.0000 mg | ORAL_TABLET | Freq: Every day | ORAL | Status: DC
  Administered 2015-06-28 – 2015-07-02 (×5): 150 mg via ORAL
  Filled 2015-06-27 (×5): qty 1

## 2015-06-27 MED ORDER — PROPOFOL 10 MG/ML IV BOLUS
INTRAVENOUS | Status: DC | PRN
  Administered 2015-06-27: 170 mg via INTRAVENOUS

## 2015-06-27 MED ORDER — PROPOFOL 10 MG/ML IV BOLUS
INTRAVENOUS | Status: AC
  Filled 2015-06-27: qty 20

## 2015-06-27 MED ORDER — SERTRALINE HCL 100 MG PO TABS
100.0000 mg | ORAL_TABLET | Freq: Every morning | ORAL | Status: DC
  Administered 2015-06-28 – 2015-07-02 (×5): 100 mg via ORAL
  Filled 2015-06-27 (×5): qty 1

## 2015-06-27 MED ORDER — SUCCINYLCHOLINE CHLORIDE 20 MG/ML IJ SOLN
INTRAMUSCULAR | Status: DC | PRN
  Administered 2015-06-27: 100 mg via INTRAVENOUS

## 2015-06-27 MED ORDER — LACTATED RINGERS IV SOLN
INTRAVENOUS | Status: DC
  Administered 2015-06-28: 125 mL/h via INTRAVENOUS

## 2015-06-27 MED ORDER — LACTATED RINGERS IV SOLN: INTRAVENOUS | Status: DC

## 2015-06-27 MED ORDER — ALVIMOPAN 12 MG PO CAPS
12.0000 mg | ORAL_CAPSULE | Freq: Two times a day (BID) | ORAL | Status: DC
  Administered 2015-06-28 – 2015-06-29 (×4): 12 mg via ORAL
  Filled 2015-06-27 (×8): qty 1

## 2015-06-27 MED ORDER — SODIUM CHLORIDE 0.9 % IR SOLN
Status: DC | PRN
  Administered 2015-06-27: 3000 mL

## 2015-06-27 MED ORDER — SUGAMMADEX SODIUM 200 MG/2ML IV SOLN
INTRAVENOUS | Status: AC
  Filled 2015-06-27: qty 2

## 2015-06-27 MED ORDER — DIPHENHYDRAMINE HCL 50 MG/ML IJ SOLN: 12.5000 mg | Freq: Four times a day (QID) | INTRAMUSCULAR | Status: DC | PRN

## 2015-06-27 MED ORDER — DIPHENHYDRAMINE HCL 12.5 MG/5ML PO ELIX: 12.5000 mg | ORAL_SOLUTION | Freq: Four times a day (QID) | ORAL | Status: DC | PRN

## 2015-06-27 MED ORDER — ONDANSETRON HCL 4 MG/2ML IJ SOLN
4.0000 mg | Freq: Four times a day (QID) | INTRAMUSCULAR | Status: DC | PRN
  Administered 2015-06-27 – 2015-06-28 (×2): 4 mg via INTRAVENOUS
  Filled 2015-06-27 (×2): qty 2

## 2015-06-27 MED ORDER — CEFOTETAN DISODIUM-DEXTROSE 2-2.08 GM-% IV SOLR
INTRAVENOUS | Status: AC
  Filled 2015-06-27: qty 50

## 2015-06-27 MED ORDER — ENOXAPARIN SODIUM 40 MG/0.4ML ~~LOC~~ SOLN
40.0000 mg | SUBCUTANEOUS | Status: DC
  Administered 2015-06-28 – 2015-07-02 (×5): 40 mg via SUBCUTANEOUS
  Filled 2015-06-27 (×6): qty 0.4

## 2015-06-27 MED ORDER — 0.9 % SODIUM CHLORIDE (POUR BTL) OPTIME
TOPICAL | Status: DC | PRN
  Administered 2015-06-27: 3000 mL

## 2015-06-27 MED ORDER — AMLODIPINE BESYLATE 5 MG PO TABS
5.0000 mg | ORAL_TABLET | Freq: Every day | ORAL | Status: DC
  Administered 2015-06-28 – 2015-07-02 (×5): 5 mg via ORAL
  Filled 2015-06-27 (×5): qty 1

## 2015-06-27 MED ORDER — AMLODIPINE BESYLATE 5 MG PO TABS
5.0000 mg | ORAL_TABLET | Freq: Once | ORAL | Status: AC
  Administered 2015-06-27: 5 mg via ORAL
  Filled 2015-06-27 (×2): qty 1

## 2015-06-27 MED ORDER — HYDROMORPHONE HCL 2 MG/ML IJ SOLN
INTRAMUSCULAR | Status: AC
  Filled 2015-06-27: qty 1

## 2015-06-27 MED ORDER — HEPARIN SODIUM (PORCINE) 5000 UNIT/ML IJ SOLN
5000.0000 [IU] | Freq: Once | INTRAMUSCULAR | Status: AC
  Administered 2015-06-27: 5000 [IU] via SUBCUTANEOUS
  Filled 2015-06-27: qty 1

## 2015-06-27 MED ORDER — DEXAMETHASONE SODIUM PHOSPHATE 10 MG/ML IJ SOLN
INTRAMUSCULAR | Status: AC
  Filled 2015-06-27: qty 1

## 2015-06-27 MED ORDER — LABETALOL HCL 5 MG/ML IV SOLN
INTRAVENOUS | Status: AC
  Filled 2015-06-27: qty 4

## 2015-06-27 MED ORDER — FENTANYL CITRATE (PF) 100 MCG/2ML IJ SOLN
INTRAMUSCULAR | Status: AC
  Filled 2015-06-27: qty 2

## 2015-06-27 MED ORDER — CEFOTETAN DISODIUM 2 G IJ SOLR
2.0000 g | INTRAMUSCULAR | Status: AC
  Administered 2015-06-27: 2 g via INTRAVENOUS

## 2015-06-27 MED ORDER — LIDOCAINE HCL (CARDIAC) 20 MG/ML IV SOLN
INTRAVENOUS | Status: AC
  Filled 2015-06-27: qty 5

## 2015-06-27 MED ORDER — LACTATED RINGERS IV SOLN
INTRAVENOUS | Status: DC | PRN
  Administered 2015-06-27 (×4): via INTRAVENOUS

## 2015-06-27 MED ORDER — ONDANSETRON HCL 4 MG/2ML IJ SOLN
INTRAMUSCULAR | Status: AC
  Filled 2015-06-27: qty 2

## 2015-06-27 MED ORDER — LABETALOL HCL 5 MG/ML IV SOLN
INTRAVENOUS | Status: DC | PRN
  Administered 2015-06-27 (×4): 5 mg via INTRAVENOUS

## 2015-06-27 MED ORDER — ACETAMINOPHEN 500 MG PO TABS
1000.0000 mg | ORAL_TABLET | Freq: Four times a day (QID) | ORAL | Status: AC
  Administered 2015-06-28: 1000 mg via ORAL
  Filled 2015-06-27 (×2): qty 2

## 2015-06-27 MED ORDER — SUGAMMADEX SODIUM 200 MG/2ML IV SOLN
INTRAVENOUS | Status: DC | PRN
  Administered 2015-06-27: 200 mg via INTRAVENOUS

## 2015-06-27 MED ORDER — MIDAZOLAM HCL 2 MG/2ML IJ SOLN
INTRAMUSCULAR | Status: AC
  Filled 2015-06-27: qty 2

## 2015-06-27 MED ORDER — HYDROMORPHONE HCL 1 MG/ML IJ SOLN
0.5000 mg | INTRAMUSCULAR | Status: DC | PRN
  Administered 2015-06-27 – 2015-06-29 (×10): 1 mg via INTRAVENOUS
  Filled 2015-06-27 (×11): qty 1

## 2015-06-27 MED ORDER — FENTANYL CITRATE (PF) 250 MCG/5ML IJ SOLN
INTRAMUSCULAR | Status: AC
  Filled 2015-06-27: qty 5

## 2015-06-27 MED ORDER — DEXAMETHASONE SODIUM PHOSPHATE 10 MG/ML IJ SOLN
INTRAMUSCULAR | Status: DC | PRN
  Administered 2015-06-27: 10 mg via INTRAVENOUS

## 2015-06-27 MED ORDER — HYDROMORPHONE HCL 1 MG/ML IJ SOLN
INTRAMUSCULAR | Status: DC | PRN
  Administered 2015-06-27 (×2): 1 mg via INTRAVENOUS

## 2015-06-27 MED ORDER — ONDANSETRON HCL 4 MG/2ML IJ SOLN
INTRAMUSCULAR | Status: DC | PRN
  Administered 2015-06-27: 4 mg via INTRAVENOUS

## 2015-06-27 MED ORDER — ALBUTEROL SULFATE (2.5 MG/3ML) 0.083% IN NEBU: 2.5000 mg | INHALATION_SOLUTION | RESPIRATORY_TRACT | Status: DC | PRN

## 2015-06-27 MED ORDER — 0.9 % SODIUM CHLORIDE (POUR BTL) OPTIME
TOPICAL | Status: DC | PRN
  Administered 2015-06-27: 1000 mL

## 2015-06-27 MED ORDER — LORAZEPAM 0.5 MG PO TABS
0.5000 mg | ORAL_TABLET | Freq: Every morning | ORAL | Status: DC
  Administered 2015-06-28 – 2015-07-02 (×5): 0.5 mg via ORAL
  Filled 2015-06-27 (×5): qty 1

## 2015-06-27 MED ORDER — MIDAZOLAM HCL 5 MG/5ML IJ SOLN
INTRAMUSCULAR | Status: DC | PRN
  Administered 2015-06-27: 2 mg via INTRAVENOUS

## 2015-06-27 MED ORDER — ALVIMOPAN 12 MG PO CAPS
12.0000 mg | ORAL_CAPSULE | Freq: Once | ORAL | Status: AC
  Administered 2015-06-27: 12 mg via ORAL
  Filled 2015-06-27: qty 1

## 2015-06-27 SURGICAL SUPPLY — 70 items
ADAPTER GOLDBERG URETERAL (ADAPTER) ×3 IMPLANT
ADPR CATH 15X14FR FL DRN BG (ADAPTER) ×3
BAG URO CATCHER STRL LF (MISCELLANEOUS) ×5 IMPLANT
BLADE EXTENDED COATED 6.5IN (ELECTRODE) ×5 IMPLANT
BLADE HEX COATED 2.75 (ELECTRODE) ×4 IMPLANT
CATH INTERMIT  6FR 70CM (CATHETERS) ×6 IMPLANT
CATH ROBINSON RED A/P 18FR (CATHETERS) ×3 IMPLANT
CATH URET 5FR 28IN CONE TIP (BALLOONS)
CATH URET 5FR 70CM CONE TIP (BALLOONS) ×3 IMPLANT
CELLS DAT CNTRL 66122 CELL SVR (MISCELLANEOUS) ×3 IMPLANT
CLOTH BEACON ORANGE TIMEOUT ST (SAFETY) ×5 IMPLANT
COVER MAYO STAND STRL (DRAPES) ×6 IMPLANT
COVER SURGICAL LIGHT HANDLE (MISCELLANEOUS) ×2 IMPLANT
DRAIN CHANNEL 19F RND (DRAIN) ×3 IMPLANT
DRAPE LAPAROSCOPIC ABDOMINAL (DRAPES) ×5 IMPLANT
DRAPE SURG IRRIG POUCH 19X23 (DRAPES) ×3 IMPLANT
DRSG TEGADERM 4X4.75 (GAUZE/BANDAGES/DRESSINGS) ×2 IMPLANT
ELECT REM PT RETURN 9FT ADLT (ELECTROSURGICAL) ×5
ELECTRODE REM PT RTRN 9FT ADLT (ELECTROSURGICAL) ×3 IMPLANT
EVACUATOR SILICONE 100CC (DRAIN) ×2 IMPLANT
GAUZE SPONGE 4X4 12PLY STRL (GAUZE/BANDAGES/DRESSINGS) ×2 IMPLANT
GLOVE BIO SURGEON STRL SZ 6.5 (GLOVE) ×15 IMPLANT
GLOVE BIO SURGEONS STRL SZ 6.5 (GLOVE) ×5
GLOVE BIOGEL M 8.0 STRL (GLOVE) ×5 IMPLANT
GLOVE BIOGEL PI IND STRL 7.0 (GLOVE) ×6 IMPLANT
GLOVE BIOGEL PI INDICATOR 7.0 (GLOVE) ×20
GLOVE ECLIPSE 8.0 STRL XLNG CF (GLOVE) ×6 IMPLANT
GOWN STRL REUS W/ TWL XL LVL3 (GOWN DISPOSABLE) ×3 IMPLANT
GOWN STRL REUS W/TWL 2XL LVL3 (GOWN DISPOSABLE) ×4 IMPLANT
GOWN STRL REUS W/TWL XL LVL3 (GOWN DISPOSABLE) ×17 IMPLANT
GUIDEWIRE STR DUAL SENSOR (WIRE) ×5 IMPLANT
HOLDER FOLEY CATH W/STRAP (MISCELLANEOUS) ×3 IMPLANT
KIT PREVENA INCISION MGT 13 (CANNISTER) ×3 IMPLANT
LEGGING LITHOTOMY PAIR STRL (DRAPES) ×3 IMPLANT
LIGASURE IMPACT 36 18CM CVD LR (INSTRUMENTS) IMPLANT
MANIFOLD NEPTUNE II (INSTRUMENTS) ×5 IMPLANT
NS IRRIG 1000ML POUR BTL (IV SOLUTION) ×10 IMPLANT
PACK COLON (CUSTOM PROCEDURE TRAY) ×5 IMPLANT
PACK CYSTO (CUSTOM PROCEDURE TRAY) ×5 IMPLANT
PACK GENERAL/GYN (CUSTOM PROCEDURE TRAY) ×2 IMPLANT
PAD POSITIONING PINK XL (MISCELLANEOUS) ×3 IMPLANT
POUCH DRAINABLE 1PC 2 1/4 FLAT (OSTOMY) ×3 IMPLANT
RETRACTOR WND ALEXIS 18 MED (MISCELLANEOUS) IMPLANT
RTRCTR WOUND ALEXIS 18CM MED (MISCELLANEOUS) ×5
SHEATH ACCESS URETERAL 38CM (SHEATH) ×2 IMPLANT
SPONGE DRAIN TRACH 4X4 STRL 2S (GAUZE/BANDAGES/DRESSINGS) ×3 IMPLANT
SPONGE LAP 18X18 X RAY DECT (DISPOSABLE) ×6 IMPLANT
STAPLER CIRC ILS CVD 33MM 37CM (STAPLE) ×3 IMPLANT
STAPLER CUT CVD 40MM GREEN (STAPLE) ×3 IMPLANT
STAPLER PROXIMATE 75MM BLUE (STAPLE) ×3 IMPLANT
STAPLER VISISTAT 35W (STAPLE) ×5 IMPLANT
SUT ETHILON 2 0 PS N (SUTURE) ×3 IMPLANT
SUT PDS AB 1 CTX 36 (SUTURE) ×4 IMPLANT
SUT PDS AB 1 TP1 96 (SUTURE) IMPLANT
SUT PROLENE 2 0 BLUE (SUTURE) IMPLANT
SUT PROLENE 2 0 KS (SUTURE) ×3 IMPLANT
SUT SILK 2 0 (SUTURE) ×5
SUT SILK 2 0 SH CR/8 (SUTURE) ×8 IMPLANT
SUT SILK 2 0SH CR/8 30 (SUTURE) ×3 IMPLANT
SUT SILK 2-0 18XBRD TIE 12 (SUTURE) ×5 IMPLANT
SUT SILK 2-0 30XBRD TIE 12 (SUTURE) IMPLANT
SUT SILK 3 0 (SUTURE) ×5
SUT SILK 3 0 SH CR/8 (SUTURE) ×10 IMPLANT
SUT SILK 3-0 18XBRD TIE 12 (SUTURE) ×4 IMPLANT
SUT VIC AB 2-0 SH 18 (SUTURE) ×9 IMPLANT
TRAY FOLEY W/METER SILVER 14FR (SET/KITS/TRAYS/PACK) ×5 IMPLANT
TRAY FOLEY W/METER SILVER 16FR (SET/KITS/TRAYS/PACK) ×2 IMPLANT
TUBING CONNECTING 10 (TUBING) ×4 IMPLANT
TUBING CONNECTING 10' (TUBING) ×1
WIRE COONS/BENSON .038X145CM (WIRE) ×2 IMPLANT

## 2015-06-27 NOTE — Anesthesia Postprocedure Evaluation (Signed)
Anesthesia Post Note  Patient: MACEL KOHEL  Procedure(s) Performed: Procedure(s) (LRB): LYSIS OF ADHESIONS LOW ANTERIOR RESECTION DIVERTING LOOP ILEOSTOMY (N/A) DIVERTING LOOP ILEOSTOMY (N/A) CYSTOSCOPY WITH STENT PLACEMENT (Bilateral)  Patient location during evaluation: PACU Anesthesia Type: General Level of consciousness: awake and alert Pain management: pain level controlled Vital Signs Assessment: post-procedure vital signs reviewed and stable Respiratory status: spontaneous breathing, nonlabored ventilation, respiratory function stable and patient connected to nasal cannula oxygen Cardiovascular status: blood pressure returned to baseline and stable Postop Assessment: no signs of nausea or vomiting Anesthetic complications: no    Last Vitals:  Filed Vitals:   06/27/15 1400 06/27/15 1415  BP: 135/75 140/76  Pulse: 91 91  Temp: 36.6 C   Resp: 17 19    Last Pain:  Filed Vitals:   06/27/15 1416  PainSc: Asleep                 Jolyssa Oplinger L

## 2015-06-27 NOTE — H&P (Signed)
The patient is a 55 year old female who presents with colonic obstruction. Patient is referred by Dr. Scarlette Shorts due to a colonic stricture. She has a history of total abdominal hysterectomy with pelvic lymphadenectomy for cervical cancer in 1994. This was followed with chemotherapy and radiation. She has not had any evidence of recurrence. For about 2 years however she has had increasing problems with abdominal bloating and difficulty with bowel movements. She would notice abdominal swelling and pain and also vomiting which she ate solid food and particularly vegetables. She underwent initially a screening colonoscopy with findings of a distal sigmoid stricture at about 20-25 cm. She subsequently has had repeat colonoscopy confirming this and biopsy showing benign ulceration and inflammatory change. She has had a virtual colonoscopy and CT of the abdomen and pelvis both of which reveal a short segment stricture in the sigmoid colon at approximately 23 cm. She underwent attempted dilatation in January of this year but quickly developed recurrent symptoms. She has been managed on an essentially a liquid diet and MiraLAX since that time and as long as she stays on this her problems have improved but she is unable to tolerate a solid diet. She has lost approximately 100 pounds over the last 2 years without dieting. There is no blood in her stools.   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 Migraine Headache Gastroesophageal Reflux Disease Anxiety Disorder Cervical Cancer Cancer  Past Surgical History Leighton Ruff, MD; Q000111Q 10:29 AM) Sentinel Lymph Node Biopsy Oral Surgery Hysterectomy (due to cancer) - Complete Colon Polyp Removal -  Colonoscopy  Diagnostic Studies History Leighton Ruff, MD; Q000111Q 10:29 AM) Mammogram within last year Colonoscopy within last year Pap Smear 1-5 years ago  Allergies Leighton Ruff, MD; Q000111Q 10:29 AM) Codeine Sulfate *ANALGESICS - OPIOID*  Medication History Leighton Ruff, MD; Q000111Q 10:29 AM) Medications Reconciled LORazepam (0.5MG  Tablet, Oral) Active. Azor (5-20MG  Tablet, Oral) Active. ProAir HFA (108 (90 Base)MCG/ACT Aerosol Soln, Inhalation) Active. Sertraline HCl (100MG  Tablet, Oral) Active. Atorvastatin Calcium (10MG  Tablet, Oral) Active.  Social History Leighton Ruff, MD; Q000111Q 10:29 AM) Caffeine use Coffee, Tea. Alcohol use Remotely quit alcohol use. Tobacco use Current every day smoker. No drug use  Family History Leighton Ruff, MD; Q000111Q 10:29 AM) Colon Polyps Father, Mother. Arthritis Father, Mother. Thyroid problems Father. Migraine Headache Daughter, Mother. Diabetes Mellitus Father. Depression Daughter, Mother. Melanoma Father. Hypertension Mother.  Pregnancy / Birth History Leighton Ruff, MD; Q000111Q 10:29 AM) Regular periods Para 1 Maternal age 27-25 Age of menopause <45 Age at menarche 39 years. Gravida 1 Contraceptive History Oral contraceptives.     Review of Systems General Present- Weight Loss. Not Present- Appetite Loss, Chills, Fatigue, Fever, Night Sweats and Weight Gain. Skin Not Present- Change in Wart/Mole, Dryness, Hives, Jaundice, New Lesions, Non-Healing Wounds, Rash and Ulcer. HEENT Present- Seasonal Allergies and Wears glasses/contact lenses. Not Present- Earache, Hearing Loss, Hoarseness, Nose Bleed, Oral Ulcers, Ringing in the Ears, Sinus Pain, Sore Throat, Visual Disturbances and Yellow Eyes. Respiratory Present- Wheezing. Not Present- Bloody sputum, Chronic Cough, Difficulty Breathing and Snoring. Breast Not Present- Breast Mass, Breast Pain, Nipple Discharge and Skin  Changes. Cardiovascular Present- Swelling of Extremities. Not Present- Chest Pain, Difficulty Breathing Lying Down, Leg Cramps, Palpitations, Rapid Heart Rate and Shortness of Breath. Gastrointestinal Present- Bloating, Nausea and Vomiting. Not Present- Abdominal Pain, Bloody  Stool, Change in Bowel Habits, Chronic diarrhea, Constipation, Difficulty Swallowing, Excessive gas, Gets full quickly at meals, Hemorrhoids, Indigestion and Rectal Pain. Female Genitourinary Present- Urgency. Not Present- Frequency, Nocturia, Painful Urination and Pelvic Pain. Psychiatric Present- Anxiety and Change in Sleep Pattern. Not Present- Bipolar, Depression, Fearful and Frequent crying. Hematology Present- Easy Bruising. Not Present- Excessive bleeding, Gland problems, HIV and Persistent Infections.  BP 137/77 mmHg  Pulse 73  Temp(Src) 98.1 F (36.7 C) (Oral)  Resp 18  Ht 5\' 2"  (1.575 m)  Wt 97.297 kg (214 lb 8 oz)  BMI 39.22 kg/m2  SpO2 98%    Physical Exam   The physical exam findings are as follows: Note:General: Alert, mildly overweight Caucasian female, in no distress Skin: Warm and dry without rash or infection. HEENT: No palpable masses or thyromegaly. Sclera nonicteric. Pupils equal round and reactive. Oropharynx clear. Lymph nodes: No cervical, supraclavicular, or inguinal nodes palpable. Lungs: Breath sounds clear and equal. No wheezing or increased work of breathing. Cardiovascular: Regular rate and rhythm without murmer. No JVD or edema. Abdomen: Nondistended. Healed low midline incision without hernias. Soft with minimal diffuse tenderness and no guarding. No masses palpable. Extremities: No edema or joint swelling or deformity. No chronic venous stasis changes. Neurologic: Alert and fully oriented. Gait normal. No focal weakness. Psychiatric: Normal mood and affect. Thought content appropriate with normal judgement and insight    Assessment & Plan   STRICTURE OF SIGMOID COLON  (K56.69) Impression: 55 year old smoker who presents to the office with a sigmoid stricture after radiation for cervical cancer in 1994. She has underwent endoscopic dilation with no improvement in her symptoms. She is now on a low residue diet and tolerating this somewhat. On CT scan it appears that her sigmoid colon is adherent to both pelvic sidewalls and fibrotic. We discussed that she will most likely need an open surgery with careful pelvic dissection and removal of the inflamed portion of colon. I would then perform an anastomosis to the rectum and a diverting loop ileostomy. She would need bilateral ureteral stents prior to surgery. She would need ostomy marking prior to surgery as well. The patient states that she will stop smoking. We will get a urine nicotine and a couple weeks and evaluate this. If that is negative, we will schedule her for surgery. We will also get a urine nicotine on the morning of surgery for confirmation. The surgery and anatomy were described to the patient as well as the risks of surgery and the possible complications. These include: Bleeding, deep abdominal infections and possible wound complications such as hernia and infection, damage to adjacent structures, leak of surgical connections, which can lead to other surgeries and possibly an ostomy, possible need for other procedures, such as abscess drains in radiology, possible prolonged hospital stay, possible diarrhea from removal of part of the colon, possible constipation from narcotics, possible bowel, bladder or sexual dysfunction if having rectal surgery, prolonged fatigue/weakness or appetite loss, possible early recurrence of of disease, possible complications of their medical problems such as heart disease or arrhythmias or lung problems, death (less than 1%). I believe the patient understands and wishes to proceed with the surgery.

## 2015-06-27 NOTE — Transfer of Care (Signed)
Immediate Anesthesia Transfer of Care Note  Patient: Cathy Jones  Procedure(s) Performed: Procedure(s): LYSIS OF ADHESIONS LOW ANTERIOR RESECTION DIVERTING LOOP ILEOSTOMY (N/A) DIVERTING LOOP ILEOSTOMY (N/A) CYSTOSCOPY WITH STENT PLACEMENT (Bilateral)  Patient Location: PACU  Anesthesia Type:General  Level of Consciousness: sedated  Airway & Oxygen Therapy: Patient Spontanous Breathing and Patient connected to face mask oxygen  Post-op Assessment: Report given to RN and Post -op Vital signs reviewed and stable  Post vital signs: Reviewed and stable  Last Vitals:  Filed Vitals:   06/27/15 0624  BP: 137/77  Pulse: 73  Temp: 36.7 C  Resp: 18    Complications: No apparent anesthesia complications

## 2015-06-27 NOTE — Anesthesia Procedure Notes (Signed)
Procedure Name: Intubation Date/Time: 06/27/2015 8:24 AM Performed by: Lind Covert Pre-anesthesia Checklist: Patient identified, Timeout performed, Emergency Drugs available, Suction available and Patient being monitored Patient Re-evaluated:Patient Re-evaluated prior to inductionOxygen Delivery Method: Circle system utilized Preoxygenation: Pre-oxygenation with 100% oxygen Intubation Type: IV induction Laryngoscope Size: Mac and 4 Grade View: Grade II Tube type: Oral Tube size: 7.0 mm Number of attempts: 1 Airway Equipment and Method: Stylet Placement Confirmation: ETT inserted through vocal cords under direct vision,  breath sounds checked- equal and bilateral and positive ETCO2 Secured at: 21 cm Tube secured with: Tape Dental Injury: Teeth and Oropharynx as per pre-operative assessment  Comments: Smooth IV atraumatic intubation

## 2015-06-27 NOTE — Op Note (Signed)
06/27/2015  1:21 PM  PATIENT:  Cathy Jones  55 y.o. female  Patient Care Team: Aletha Halim, PA-C as PCP - General (Family Medicine)  PRE-OPERATIVE DIAGNOSIS:  colon stricture  POST-OPERATIVE DIAGNOSIS:  colon stricture  PROCEDURE:  PELVIC LYSIS OF ADHESIONS, LOW ANTERIOR RESECTION, DIVERTING LOOP ILEOSTOMY CYSTOSCOPY WITH STENT PLACEMENT  SURGEON:  Surgeon(s): Leighton Ruff, MD Franchot Gallo, MD Michael Boston, MD  ASSISTANT: Dr Johney Maine   ANESTHESIA:   general  EBL:  Total I/O In: 3100 [I.V.:3100] Out: 425 [Urine:225; Blood:200]  DRAINS: (58F) Jackson-Pratt drain(s) with closed bulb suction in the pelvis   SPECIMEN:  Source of Specimen:  proximal rectum, rectosigmoid  DISPOSITION OF SPECIMEN:  PATHOLOGY  COUNTS:  YES  PLAN OF CARE: Admit to inpatient   PATIENT DISPOSITION:  PACU - hemodynamically stable.  INDICATION: 55 y.o. F with a rectal stricture s/p radiation therapy, total abdominal hysterectomy and hysterectomy for cervical cancer. Her preoperative virtual colonoscopy was negative for any masses.    OR FINDINGS: Significant pelvic adhesions on the right side. Stricture located in the rectosigmoid area.  DESCRIPTION: the patient was identified in the preoperative holding area and taken to the OR where they were laid supineon the operating room table.  General anesthesia was induced without difficulty. SCDs were also noted to be in place prior to the initiation of anesthesia.  The patient was placed in lithotomy position. Dr. Zannie Cove then performed a cystoscopy with stent placement to help with identifying the ureters throughout the case. After this was completed, the patient was then prepped and draped in the usual sterile fashion.   A surgical timeout was performed indicating the correct patient, procedure, positioning and need for preoperative antibiotics.  I began by making a lower midline incision using a 10 blade scalpel. This was carried down  through subcutaneous tissues using electrocautery. The fascia was incised at midline. The peritoneum was entered bluntly. 2 Kocher's were used to elevate the fascia and the omental adhesions were taken down using Bovie electrocautery. Once the omentum was free from the anterior abdominal wall, there were minimal adhesions underneath. I began by locating the sigmoid colon on the patient's left side. The small bowel was packed out of the pelvis and the Bookwalter retractor and Alexis wound protector were used to gain exposure to the pelvis. Bilaterally mobilize the colon down to the level of the mid rectum. I was able to palpate the stent in the left ureter and preserve this as we went. I mobilized the colon up the white line of Toldt to the splenic flexure to allow for the remaining colon to mobilize down into the pelvis. Once the left colon was free from the left pelvic sidewall I terminated attention to the right side. The rectosigmoid junction was densely adherent to the right pelvic sidewall and right ureter. I began to mobilize the sigmoid colon out of the pelvis. I could not get exposure to the area of adhesion that was diagnosed or mobilizing this out of the pelvis. I decided to transect the distal sigmoid colon and brought this out of the pelvis. The end was sutured close and this was packed back into the abdomen. I then freed the remaining rectosigmoid area using careful blunt dissection as well as sharp dissection with Metzenbaum scissors. I mobilized the posterior rectal plane using blunt dissection. This allowed me to continue to free the lateral wall of the colon. This process took approximately one hour of careful dissection. After freeing the lateral sidewall  of the rectosigmoid junction, I bluntly mobilized the mesorectum below this to create a plane between the rectum and the mesorectum. I was unable to get a contour stapler this plane and stapled the end of the rectum using the stapler. A green load  was used without difficulty. The hemorrhoidal artery was tied off using a 2-0 silk suture ligature. The remaining mesentery was divided using Bovie electrocautery. I then brought down the remaining sigmoid colon that was packed in the upper abdomen and transected the distal portion of this appeared to be scarred from radiation. A pursestring device was placed on the remaining colon which easily reached into the pelvis. A 33 mm EEA anvil was inserted into the distal colon and tied tightly around the anvil.  A 33 mm EEA stapler was inserted into the rectum and brought out just inferior to the staple line. An anastomosis was created without tension. Upon testing this with insufflation under water there was a small leak. I was unable to identify the location of the leak and therefore we decided to perform a diverting ileostomy. The terminal ileum and cecum were mobilized from the retroperitoneal structures using blunt dissection and Metzenbaum scissors to divide the remaining scar. A portion of the distal terminal ileum was densely adherent to the right ureter. I decided to sacrifice this portion of the terminal ileum to preserve the ureter. This allowed for mobilization of the terminal ileum out of the pelvis. The distal limbs of the terminal ileum were stapled using a 75 mm blue GIA stapler.  The common enterotomy was closed with a blue load TA stapler. The corners of the staple line was oversewn using 3-0 silk sutures. The crotch of the anastomosis was also reinforced using a 3-0 silk suture. I then mobilized a portion of the terminal ileum to the previously marked ostomy site. The ostomy was created by dividing the skin and subcutaneous tissues using electrocautery. The fascia was divided in a cruciate manner and the peritoneum was entered bluntly after splitting the rectus muscle. The terminal ileum was brought out through this opening and secured with a red rubber catheter. There was no twisting of the ostomy  noted. A 19 [place] Blake drain was then placed into the pelvis and brought out through the left lower quadrant area and sutured into place with a 2-0 nylon suture. The omentum was mobilized off of the left transverse colon to allow for it to reach into the pelvis. We then placed the omentum over the small bowel and down into the pelvis. When an switched to clean gowns, gloves, drapes and instruments. The abdomen was irrigated with normal saline. The fascia was closed using 2-0 PDS running sutures. The subcutaneous tissue was closed using 2-0 Vicryl sutures. The skin was closed using staples. An incisional wound VAC was placed over this to high-risk nature of her wound. The loop ileostomy was matured over the red rubber catheter using standard Brook technique. This was completed using 2-0 Vicryl sutures. The ostomy was patent at the end of the procedure. Ostomy appliance was placed. The patient was then awakened from anesthesia and sent to the postanesthesia carried in stable condition. All counts were correct per operating room staff.

## 2015-06-27 NOTE — Op Note (Signed)
Preoperative diagnosis: Colon stricture, status post radiotherapy  Postoperative diagnosis: Same   Procedure: Cystoscopy, placement of bilateral ureteral catheters-6 French open-ended catheters    Surgeon: Lillette Boxer. Dorcus Riga, M.D.   Anesthesia: Gen.   Complications: None  Specimen(s): None  Drain(s): Bilateral 6 [place] open-ended catheters, 49 French Foley catheter  Indications: 55 year old female who will undergo partial colectomy for a stricture of her sigmoid colon with significant GI side effects. Because of her radiation and pelvic issues, I have been asked to place ureteral catheters bilaterally. The procedure has been discussed with the patient this morning    Technique and findings: The patient was identified in the holding area. She received preoperative IV antibiotics. She was taken to the operating room where general anesthetic was administered with the endotracheal apparatus. She was placed in the dorsolithotomy position. Genitalia and perineum were prepped and draped. Proper timeout was performed.  A 23 French endoscope was advanced in her bladder. Inspection of the bladder revealed mild prominence of vasculature, but no urothelial lesions, trabeculations or foreign bodies. Ureteral orifices were normal in configuration and location.  With the aid of a sensor-tip guidewire, a 6 [place] open-ended catheters were placed bilaterally through each ureteral orifice, and advanced into the renal pelvic area bilaterally. Ureteral length was approximately 22 cm bilaterally. Once adequate position was felt, the     sensor-tip guidewire was removed from each catheter.  The scope was then removed, leaving the open-ended catheters in place. A 14 French Foley catheter was then placed in the balloon filled with 10 mL of water. The open-ended catheters were sutured to the Foley catheter. The open-ended catheters were then drained with a Goldberg catheter adapter and hooked to dependent  drainage. At this point, the procedure was terminated. Dr. Marcello Moores then commenced with her procedure.

## 2015-06-27 NOTE — H&P (Signed)
  H&P  Chief Complaint: Colon stricture  History of Present Illness: Cathy Jones is a 55 y.o. year old female who presents for partial colectomy for treatment of an obstructing colon stricture by Dr. Marcello Moores. The patient is status post radiation for gynecologic cancer several years ago. She has this stricture and significant GI problems because of it. Prior to colectomy, I have been asked to place bilateral ureteral catheters.  Past Medical History  Diagnosis Date  . Hypertension   . Hyperlipidemia   . Cervical cancer ([place])   . Asthma     pt brought her proair inhaler 06-06-14  . Colon polyp     hyperplastic  . Colon stricture ([place])   . Colonic obstruction ([place])   . Family history of adverse reaction to anesthesia     pts mother experiences nausea and vomiting   . Shortness of breath dyspnea     seasonal   . History of bronchitis   . Depression   . Headache     migraines  . History of hiatal hernia   . Ear infection     left   . Sinus infection     Past Surgical History  Procedure Laterality Date  . Abdominal hysterectomy  1994  . Colonoscopy    . Upper gastrointestinal endoscopy    . Tubal ligation      Home Medications:  No prescriptions prior to admission    Allergies:  Allergies  Allergen Reactions  . Codeine Other (See Comments)    "hyper"    Family History  Problem Relation Age of Onset  . Colon cancer Neg Hx   . Stomach cancer Neg Hx   . Esophageal cancer Neg Hx   . Rectal cancer Cousin     Social History:  reports that she quit smoking about 12 months ago. Her smoking use included Cigarettes. She has a 20 pack-year smoking history. She has never used smokeless tobacco. She reports that she drinks alcohol. She reports that she uses illicit drugs.  ROS: A complete review of systems was performed.  All systems are negative except for pertinent findings as noted.  Physical Exam:  Vital signs in last 24 hours:   General:  Alert and oriented, No  acute distress HEENT: Normocephalic, atraumatic Neck: No JVD or lymphadenopathy Cardiovascular: Regular rate and rhythm Lungs: Clear bilaterally Abdomen: Soft, nontender, nondistended, no abdominal masses Back: No CVA tenderness Extremities: No edema Neurologic: Grossly intact  Laboratory Data:  No results found for this or any previous visit (from the past 24 hour(s)). No results found for this or any previous visit (from the past 240 hour(s)). Creatinine: No results for input(s): CREATININE in the last 168 hours.  Radiologic Imaging: No results found.  Impression/Assessment:  Colon stricture  Plan:  Cystoscopy, bilateral ureteral catheter placement  Jorja Loa 06/27/2015, 6:19 AM  Lillette Boxer. Lilliam Chamblee MD

## 2015-06-28 LAB — BASIC METABOLIC PANEL
Anion gap: 10 (ref 5–15)
BUN: 14 mg/dL (ref 6–20)
CO2: 26 mmol/L (ref 22–32)
CREATININE: 0.84 mg/dL (ref 0.44–1.00)
Calcium: 8 mg/dL — ABNORMAL LOW (ref 8.9–10.3)
Chloride: 102 mmol/L (ref 101–111)
GFR calc Af Amer: >60 mL/min (ref >60)
Glucose, Bld: 147 mg/dL — ABNORMAL HIGH (ref 65–99)
POTASSIUM: 4.2 mmol/L (ref 3.5–5.1)
SODIUM: 138 mmol/L (ref 135–145)

## 2015-06-28 LAB — CBC
HCT: 37.2 % (ref 36.0–46.0)
Hemoglobin: 11.9 g/dL — ABNORMAL LOW (ref 12.0–15.0)
MCH: 27.9 pg (ref 26.0–34.0)
MCHC: 32 g/dL (ref 30.0–36.0)
MCV: 87.1 fL (ref 78.0–100.0)
PLATELETS: 242 10*3/uL (ref 150–400)
RBC: 4.27 MIL/uL (ref 3.87–5.11)
RDW: 14.8 % (ref 11.5–15.5)
WBC: 18.8 10*3/uL — AB (ref 4.0–10.5)

## 2015-06-28 MED ORDER — KCL IN DEXTROSE-NACL 20-5-0.45 MEQ/L-%-% IV SOLN
INTRAVENOUS | Status: DC
  Administered 2015-06-28: 75 mL/h via INTRAVENOUS
  Administered 2015-06-28: 09:00:00 via INTRAVENOUS
  Administered 2015-06-29: 1000 mL via INTRAVENOUS
  Filled 2015-06-28 (×5): qty 1000

## 2015-06-28 MED ORDER — HYDROMORPHONE HCL 1 MG/ML IJ SOLN
1.0000 mg | Freq: Once | INTRAMUSCULAR | Status: AC
  Administered 2015-06-28: 1 mg via INTRAVENOUS
  Filled 2015-06-28: qty 1

## 2015-06-28 NOTE — Progress Notes (Signed)
PT Cancellation Note  Patient Details Name: KIRTI MOYNAHAN MRN: [id number] DOB: 02-Jun-1960   Cancelled Treatment:    Reason Eval/Treat Not Completed: PT screened, no needs identified, will sign off (Per pt and nurse tech, pt ambulated 800' this morning without loss of balance, pt reports she's independent with bed mobility, transfers, and ambulation. No further PT indicatied, will sign off. )   Philomena Doheny 06/28/2015, 1:17 PM [phone]

## 2015-06-28 NOTE — Consult Note (Signed)
WOC ostomy consult note Stoma type/location: RUQ ileostomy with red rubber catheter bridge in place Stomal assessment/size: 1 and 1/4 inch oval, budded, some edema, red Peristomal assessment: intact Treatment options for stomal/peristomal skin: none, except for skin barrier ring Output: flatus Ostomy pouching: 1 piece convex ostomy pouching system. Today is wearing one that cuts up to 2 inches, she will be transitioning to one that cuts up to 1 and 1/2 inches with a skin barrier ring Education provided: Mother and patient are taught today about the GI A&P, also stoma and pouch characteristics.  They are instructed on when to empty pouch, and simulated emptying and cleaning the bottom 2 inches of the tail closure using a toilet paper wick.  We will plan for another pouch change tomorrow afternoon for practice Enrolled patient in [place] program: Yes [place] nursing team will follow, but will remain available to this patient, the nursing and medical teams.   Thanks, Maudie Flakes, MSN, RN, [place], Arther Abbott  Pager# [phone]

## 2015-06-28 NOTE — Progress Notes (Signed)
1 Day Post-Op Open LAR with diverting ileostomy Subjective: No major complaints, just "sore".  No nausea  Objective: Vital signs in last 24 hours: Temp:  [97.5 F (36.4 C)-99 F (37.2 C)] 99 F (37.2 C) (02/16 0529) Pulse Rate:  [84-102] 102 (02/16 0529) Resp:  [14-19] 16 (02/16 0529) BP: (127-158)/(60-95) 129/61 mmHg (02/16 0529) SpO2:  [94 %-99 %] 94 % (02/16 0529)   Intake/Output from previous day: 02/15 0701 - 02/16 0700 In: 3400 [I.V.:3400] Out: 2350 [Urine:1725; Drains:375; Stool:50; Blood:200] Intake/Output this shift:     General appearance: alert and cooperative GI: soft, non-distended Urine with a dark brown color, no bright red blood anymore Incision: no significant drainage, Prevena wound vac in place  Lab Results:   Recent Labs  06/28/15 0431  WBC 18.8*  HGB 11.9*  HCT 37.2  PLT 242   BMET  Recent Labs  06/28/15 0431  NA 138  K 4.2  CL 102  CO2 26  GLUCOSE 147*  BUN 14  CREATININE 0.84  CALCIUM 8.0*   PT/INR No results for input(s): LABPROT, INR in the last 72 hours. ABG No results for input(s): PHART, HCO3 in the last 72 hours.  Invalid input(s): PCO2, PO2  MEDS, Scheduled . acetaminophen  1,000 mg Oral 4 times per day  . alvimopan  12 mg Oral BID  . amLODipine  5 mg Oral Daily   And  . irbesartan  150 mg Oral Daily  . enoxaparin (LOVENOX) injection  40 mg Subcutaneous Q24H  . LORazepam  0.5 mg Oral q morning - 10a  . sertraline  100 mg Oral q morning - 10a    Studies/Results: No results found.  Assessment: s/p Procedure(s): LYSIS OF ADHESIONS LOW ANTERIOR RESECTION DIVERTING LOOP ILEOSTOMY DIVERTING LOOP ILEOSTOMY CYSTOSCOPY WITH CATHETER  PLACEMENT Patient Active Problem List   Diagnosis Date Noted  . Rectal stricture 06/27/2015    Expected post op course  Plan: Advance diet to clears Switch to MIV, decrease rate Ambulate, IS   LOS: 1 day     .Rosario Adie, [facility],  [place]   06/28/2015 7:24 AM

## 2015-06-29 LAB — BASIC METABOLIC PANEL
Anion gap: 5 (ref 5–15)
BUN: 10 mg/dL (ref 6–20)
CALCIUM: 8.1 mg/dL — AB (ref 8.9–10.3)
CHLORIDE: 103 mmol/L (ref 101–111)
CO2: 29 mmol/L (ref 22–32)
CREATININE: 0.7 mg/dL (ref 0.44–1.00)
GFR calc non Af Amer: >60 mL/min (ref >60)
Glucose, Bld: 124 mg/dL — ABNORMAL HIGH (ref 65–99)
Potassium: 4.2 mmol/L (ref 3.5–5.1)
SODIUM: 137 mmol/L (ref 135–145)

## 2015-06-29 LAB — CBC
HCT: 29.6 % — ABNORMAL LOW (ref 36.0–46.0)
Hemoglobin: 9.6 g/dL — ABNORMAL LOW (ref 12.0–15.0)
MCH: 28.2 pg (ref 26.0–34.0)
MCHC: 32.4 g/dL (ref 30.0–36.0)
MCV: 86.8 fL (ref 78.0–100.0)
PLATELETS: 179 10*3/uL (ref 150–400)
RBC: 3.41 MIL/uL — AB (ref 3.87–5.11)
RDW: 14.7 % (ref 11.5–15.5)
WBC: 12.4 10*3/uL — ABNORMAL HIGH (ref 4.0–10.5)

## 2015-06-29 MED ORDER — OXYCODONE HCL 5 MG PO TABS
5.0000 mg | ORAL_TABLET | ORAL | Status: DC | PRN
  Administered 2015-06-29: 5 mg via ORAL
  Administered 2015-06-30 – 2015-07-02 (×6): 10 mg via ORAL
  Filled 2015-06-29 (×3): qty 2
  Filled 2015-06-29: qty 1
  Filled 2015-06-29 (×3): qty 2

## 2015-06-29 NOTE — Progress Notes (Signed)
2 Days Post-Op Open LAR with diverting ileostomy Subjective: No major complaints, just "sore".  No nausea. No bowel function yet.  Foley out.  Working on pouching issues  Objective: Vital signs in last 24 hours: Temp:  [99 F (37.2 C)-99.5 F (37.5 C)] 99.5 F (37.5 C) (02/16 2223) Pulse Rate:  [96-100] 98 (02/16 2223) Resp:  [16-17] 17 (02/16 2223) BP: (114-143)/(56-71) 114/56 mmHg (02/16 2223) SpO2:  [93 %-98 %] 93 % (02/16 2223)   Intake/Output from previous day: 02/16 0701 - 02/17 0700 In: 1236.3 [P.O.:200; I.V.:1036.3] Out: 4098 [Urine:3950; Drains:148] Intake/Output this shift:     General appearance: alert and cooperative GI: soft, non-distended  Ostomy a bit retracted but still above skin level Incision: no significant drainage, Prevena wound vac in place  Lab Results:   Recent Labs  06/28/15 0431 06/29/15 0415  WBC 18.8* 12.4*  HGB 11.9* 9.6*  HCT 37.2 29.6*  PLT 242 179   BMET  Recent Labs  06/28/15 0431 06/29/15 0415  NA 138 137  K 4.2 4.2  CL 102 103  CO2 26 29  GLUCOSE 147* 124*  BUN 14 10  CREATININE 0.84 0.70  CALCIUM 8.0* 8.1*   PT/INR No results for input(s): LABPROT, INR in the last 72 hours. ABG No results for input(s): PHART, HCO3 in the last 72 hours.  Invalid input(s): PCO2, PO2  MEDS, Scheduled . alvimopan  12 mg Oral BID  . amLODipine  5 mg Oral Daily   And  . irbesartan  150 mg Oral Daily  . enoxaparin (LOVENOX) injection  40 mg Subcutaneous Q24H  . LORazepam  0.5 mg Oral q morning - 10a  . sertraline  100 mg Oral q morning - 10a    Studies/Results: No results found.  Assessment: s/p Procedure(s): LYSIS OF ADHESIONS LOW ANTERIOR RESECTION DIVERTING LOOP ILEOSTOMY DIVERTING LOOP ILEOSTOMY CYSTOSCOPY WITH CATHETER  PLACEMENT Patient Active Problem List   Diagnosis Date Noted  . Rectal stricture 06/27/2015    Expected post op course  Plan: Advance diet to full liquids Cont MIV, decrease rate Ambulate,  IS   LOS: 2 days     .Rosario Adie, [facility], [place]   06/29/2015 7:37 AM

## 2015-06-29 NOTE — Discharge Instructions (Addendum)
ABDOMINAL SURGERY: POST OP INSTRUCTIONS ° °1. DIET: Follow a light bland diet the first 24 hours after arrival home, such as soup, liquids, crackers, etc.  Be sure to include lots of fluids daily.  Avoid fast food or heavy meals as your are more likely to get nauseated.  Do not eat any uncooked fruits or vegetables for the next 2 weeks as your colon heals. °2. Take your usually prescribed home medications unless otherwise directed. °3. PAIN CONTROL: °a. Pain is best controlled by a usual combination of three different methods TOGETHER: °i. Ice/Heat °ii. Over the counter pain medication °iii. Prescription pain medication °b. Most patients will experience some swelling and bruising around the incisions.  Ice packs or heating pads (30-60 minutes up to 6 times a day) will help. Use ice for the first few days to help decrease swelling and bruising, then switch to heat to help relax tight/sore spots and speed recovery.  Some people prefer to use ice alone, heat alone, alternating between ice & heat.  Experiment to what works for you.  Swelling and bruising can take several weeks to resolve.   °c. It is helpful to take an over-the-counter pain medication regularly for the first few weeks.  Choose one of the following that works best for you: °i. Naproxen (Aleve, etc)  Two 220mg tabs twice a day °ii. Ibuprofen (Advil, etc) Three 200mg tabs four times a day (every meal & bedtime) °iii. Acetaminophen (Tylenol, etc) 500-650mg four times a day (every meal & bedtime) °d. A  prescription for pain medication (such as oxycodone, hydrocodone, etc) should be given to you upon discharge.  Take your pain medication as prescribed.  °i. If you are having problems/concerns with the prescription medicine (does not control pain, nausea, vomiting, rash, itching, etc), please call us [phone] to see if we need to switch you to a different pain medicine that will work better for you and/or control your side effect better. °ii. If you  need a refill on your pain medication, please contact your pharmacy.  They will contact our office to request authorization. Prescriptions will not be filled after 5 pm or on week-ends. °4. Avoid getting constipated.  Between the surgery and the pain medications, it is common to experience some constipation.  Increasing fluid intake and taking a fiber supplement (such as Metamucil, Citrucel, FiberCon, MiraLax, etc) 1-2 times a day regularly will usually help prevent this problem from occurring.  A mild laxative (prune juice, Milk of Magnesia, MiraLax, etc) should be taken according to package directions if there are no bowel movements after 48 hours.   °5. Watch out for diarrhea.  If you have many loose bowel movements, simplify your diet to bland foods & liquids for a few days.  Stop any stool softeners and decrease your fiber supplement.  Switching to mild anti-diarrheal medications (Kayopectate, Pepto Bismol) can help.  If this worsens or does not improve, please call us. °6. Wash / shower every day.  You may shower over the incision / wound.  Avoid baths until the skin is fully healed.  Continue to shower over incision(s) after the dressing is off. °7. Remove your waterproof bandages 5 days after surgery.  You may leave the incision open to air.  You may replace a dressing/Band-Aid to cover the incision for comfort if you wish. °8. ACTIVITIES as tolerated:   °a. You may resume regular (light) daily activities beginning the next day--such as daily self-care, walking, climbing stairs--gradually increasing activities as   tolerated.  If you can walk 30 minutes without difficulty, it is safe to try more intense activity such as jogging, treadmill, bicycling, low-impact aerobics, swimming, etc. b. Save the most intensive and strenuous activity for last such as sit-ups, heavy lifting, contact sports, etc  Refrain from any heavy lifting or straining until you are off narcotics for pain control.   c. DO NOT PUSH THROUGH  PAIN.  Let pain be your guide: If it hurts to do something, don't do it.  Pain is your body warning you to avoid that activity for another week until the pain goes down. d. You may drive when you are no longer taking prescription pain medication, you can comfortably wear a seatbelt, and you can safely maneuver your car and apply brakes. e. Dennis Bast may have sexual intercourse when it is comfortable.  9. FOLLOW UP in our office a. Please call [facility] at [phone] to set up an appointment to see your surgeon in the office for a follow-up appointment approximately 1-2 weeks after your surgery. b. Make sure that you call for this appointment the day you arrive home to insure a convenient appointment time. 10. IF YOU HAVE DISABILITY OR FAMILY LEAVE FORMS, BRING THEM TO THE OFFICE FOR PROCESSING.  DO NOT GIVE THEM TO YOUR DOCTOR.  11.       Empty and record ostomy output amount.  You should use imodium (up to 8 pills per day) to keep your ostomy output under 1000 ml.  Remember to drink plenty of non-caffeinated fluids to replenish what you've lost.     WHEN TO CALL us [phone]: 1. Poor pain control 2. Reactions / problems with new medications (rash/itching, nausea, etc)  3. Fever over 101.5 F (38.5 C) 4. Inability to urinate 5. Nausea and/or vomiting 6. Worsening swelling or bruising 7. Continued bleeding from incision. 8. Increased pain, redness, or drainage from the incision  The clinic staff is available to answer your questions during regular business hours (8:30am-5pm).  Please dont hesitate to call and ask to speak to one of our nurses for clinical concerns.   A surgeon from [facility] is always on call at the hospitals   If you have a medical emergency, go to the nearest emergency room or call 911.    [facility], [place], [place], [place],   [address] ? MAIN: [phone] ? TOLL FREE: [phone] ? FAX [phone]  [phone] [email or web address]

## 2015-06-29 NOTE — Care Management Note (Signed)
Case Management Note  Patient Details  Name: ROSHNI MCCRITE MRN: [id number] Date of Birth: 1960/12/01  Subjective/Objective:   S/p Open LAR with diverting ileostomy                 Action/Plan: Discharge planning, spoke with patient at beside. [facility] services, contacted [facility] for referral.   Expected Discharge Date:  07/01/15               Expected Discharge Plan:  [place]  In-House Referral:  NA  Discharge planning Services  CM Consult  Post Acute Care Choice:  Home Health Choice offered to:  Patient  DME Arranged:  N/A DME Agency:  NA  HH Arranged:  RN, Disease Management [place] Agency:  [place]  Status of Service:  Completed, signed off  Medicare Important Message Given:    Date Medicare IM Given:    Medicare IM give by:    Date Additional Medicare IM Given:    Additional Medicare Important Message give by:     If discussed at [place] of Stay Meetings, dates discussed:    Additional Comments:  Guadalupe Maple, RN 06/29/2015, 1:06 PM [phone]

## 2015-06-29 NOTE — Consult Note (Signed)
WOC ostomy follow up Stoma type/location: RUQ ileostomy with red rubber catheter bridge in place Stomal assessment/size: 1 and 1/2 inch, red, budded from 9-3 o'clock.  Flush from 3-9 o'clock. Peristomal assessment: intact Treatment options for stomal/peristomal skin: skin barrier ring and convex pouch Output scant serous Ostomy pouching: 1pc.convex pouch Lawson # [phone] and skin barrier ring Kellie Simmering [phone] Education provided: Extended session with patient and her mother.  Further discussion about routine of emptying when she empties her bladder, changing the pouch in the morning and teaching them that she may both shower with her pouch on or off. Asking appropriate questions.  Demonstrated sizing, cutting out pouch, placing ring on the back of the pouch and applying.  All in family can perform Lock and Roll closure and both women know how to empty pouch and use toilet paper wicks to clean the bottom 2-inches of the tail closure. Enrolled patient in [place] Start Discharge program: Yes Prevena incisional NPWT  System is alarming at this time and local sales rep is coming to assess system and repair.  I will wait for her arrival so that we are assured of a quiet night for this nice patient. [place] nursing team will follow, and will remain available to this patient, the nursing, surgical and medical teams.  Please re-consult if needed. Thanks, Maudie Flakes, MSN, RN, [place], Arther Abbott  Pager# [phone]

## 2015-06-30 LAB — CBC
HCT: 31.2 % — ABNORMAL LOW (ref 36.0–46.0)
Hemoglobin: 10 g/dL — ABNORMAL LOW (ref 12.0–15.0)
MCH: 28.2 pg (ref 26.0–34.0)
MCHC: 32.1 g/dL (ref 30.0–36.0)
MCV: 87.9 fL (ref 78.0–100.0)
Platelets: 194 K/uL (ref 150–400)
RBC: 3.55 MIL/uL — ABNORMAL LOW (ref 3.87–5.11)
RDW: 14.5 % (ref 11.5–15.5)
WBC: 11.1 K/uL — ABNORMAL HIGH (ref 4.0–10.5)

## 2015-06-30 LAB — BASIC METABOLIC PANEL WITH GFR
Anion gap: 6 (ref 5–15)
BUN: 11 mg/dL (ref 6–20)
CO2: 28 mmol/L (ref 22–32)
Calcium: 8.2 mg/dL — ABNORMAL LOW (ref 8.9–10.3)
Chloride: 102 mmol/L (ref 101–111)
Creatinine, Ser: 0.69 mg/dL (ref 0.44–1.00)
GFR calc Af Amer: >60 mL/min (ref >60)
GFR calc non Af Amer: >60 mL/min (ref >60)
Glucose, Bld: 112 mg/dL — ABNORMAL HIGH (ref 65–99)
Potassium: 3.8 mmol/L (ref 3.5–5.1)
Sodium: 136 mmol/L (ref 135–145)

## 2015-06-30 NOTE — Progress Notes (Signed)
3 Days Post-Op  Subjective: Pain much better today than yesterday. No n/v. Ambulated. Started to have ostomy output last evening.   Objective: Vital signs in last 24 hours: Temp:  [98.3 F (36.8 C)-99.5 F (37.5 C)] 99.2 F (37.3 C) (02/18 0515) Pulse Rate:  [68-98] 88 (02/18 0515) Resp:  [17-18] 18 (02/18 0515) BP: (103-111)/(53-58) 110/58 mmHg (02/18 1014) SpO2:  [91 %-95 %] 95 % (02/18 0515) Last BM Date: 06/26/15  Intake/Output from previous day: 02/17 0701 - 02/18 0700 In: 1110 [P.O.:300; I.V.:810] Out: 2475 [Urine:2375; Drains:100] Intake/Output this shift: Total I/O In: -  Out: 580 [Urine:200; Drains:30; Stool:350]  Alert, pleasant cta b/l Reg Obese, soft, wound vac intact; drain -serosang. RLQ ostomy viable with liquid stool in bag.  No edema  Lab Results:   Recent Labs  06/29/15 0415 06/30/15 0501  WBC 12.4* 11.1*  HGB 9.6* 10.0*  HCT 29.6* 31.2*  PLT 179 194   BMET  Recent Labs  06/29/15 0415 06/30/15 0501  NA 137 136  K 4.2 3.8  CL 103 102  CO2 29 28  GLUCOSE 124* 112*  BUN 10 11  CREATININE 0.70 0.69  CALCIUM 8.1* 8.2*   PT/INR No results for input(s): LABPROT, INR in the last 72 hours. ABG No results for input(s): PHART, HCO3 in the last 72 hours.  Invalid input(s): PCO2, PO2  Studies/Results: No results found.  Anti-infectives: Anti-infectives    Start     Dose/Rate Route Frequency Ordered Stop   06/27/15 2000  cefoTEtan (CEFOTAN) 2 g in dextrose 5 % 50 mL IVPB     2 g 100 mL/hr over 30 Minutes Intravenous Every 12 hours 06/27/15 1442 06/27/15 2030   06/27/15 0629  cefoTEtan (CEFOTAN) 2 g in dextrose 5 % 50 mL IVPB     2 g 100 mL/hr over 30 Minutes Intravenous On call to O.R. 06/27/15 [id number] 06/27/15 0826      Assessment/Plan: s/p Procedure(s): LYSIS OF ADHESIONS LOW ANTERIOR RESECTION DIVERTING LOOP ILEOSTOMY (N/A) DIVERTING LOOP ILEOSTOMY (N/A) CYSTOSCOPY WITH CATHETER  PLACEMENT (Bilateral)  Doing well Cont pulm  toilet Adv to soft diet this pm Cont chemical vte prophylaxis  Leighton Ruff. Redmond Pulling, MD, FACS General, Bariatric, & Minimally Invasive Surgery [facility], [place]   LOS: 3 days    Gayland Curry 06/30/2015

## 2015-06-30 NOTE — Progress Notes (Signed)
Dr. Redmond Pulling aware IV came out recently. Pt doing well with no complaints voiced. Order received to leave IV out.

## 2015-06-30 NOTE — Progress Notes (Signed)
Pt emptying ostomy bag independently.

## 2015-06-30 NOTE — Consult Note (Signed)
WOC wound follow up Wound type:Surgical, closely approximated Measurement:Beneath NPWT (Prevena) device, not observed (Seal intact after attaching to hospital unit on Friday evening) Drainage (amount, consistency, odor) serosanguinous in tubing Periwound:not seen Dressing procedure/placement/frequency: NPWT seal was compromised during pouch change on Friday; reestablished by attaching to hospital NPWT device at 5:15pm and has seal has been maintained ever since.  WOC ostomy follow up Stoma type/location: RUQ ileostomy, beginning to function. Dr. Redmond Pulling saw today and continued diet advancement. Stomal assessment/size: 1 and 1/2 inch with red rubber catheter "loop" bridge intact Peristomal assessment: not seen today Treatment options for stomal/peristomal skin: skin barrier ring Output Brown effluent Ostomy pouching: 1pc.convex pouching system with skin barrier ring.  Education provided: Patient demonstrates emptying today independently. Sufficiently cleans bottom of tail closure with toilet paper. Enrolled patient in [place] Start Discharge program: Yes  [place] nursing team will follow, and will remain available to this patient, the nursing, surgical and medical teams.   Thanks, Maudie Flakes, MSN, RN, [place], Arther Abbott  Pager# [phone]

## 2015-07-01 NOTE — Progress Notes (Signed)
4 Days Post-Op  Subjective: Did not tolerate soft diet yesterday->bloating and pain.  Put herself back on liquids.  Objective: Vital signs in last 24 hours: Temp:  [98 F (36.7 C)-98.4 F (36.9 C)] 98.2 F (36.8 C) (02/19 0548) Pulse Rate:  [73-97] 73 (02/19 0548) Resp:  [16-18] 16 (02/19 0548) BP: (109-119)/(51-58) 109/51 mmHg (02/19 0548) SpO2:  [94 %-98 %] 94 % (02/19 0548) Last BM Date: 06/30/15  Intake/Output from previous day: 02/18 0701 - 02/19 0700 In: 604.5 [P.O.:480; I.V.:124.5] Out: 2475 [Urine:1575; Drains:100; Stool:800] Intake/Output this shift:    PE: General- In NAD Abdomen-soft, some distension, VAC on, serosanguinous drain output, loop ileostomy edematous with green liquid output and red rubber catheter in place.  Lab Results:   Recent Labs  06/29/15 0415 06/30/15 0501  WBC 12.4* 11.1*  HGB 9.6* 10.0*  HCT 29.6* 31.2*  PLT 179 194   BMET  Recent Labs  06/29/15 0415 06/30/15 0501  NA 137 136  K 4.2 3.8  CL 103 102  CO2 29 28  GLUCOSE 124* 112*  BUN 10 11  CREATININE 0.70 0.69  CALCIUM 8.1* 8.2*   PT/INR No results for input(s): LABPROT, INR in the last 72 hours. Comprehensive Metabolic Panel:    Component Value Date/Time   NA 136 06/30/2015 0501   NA 137 06/29/2015 0415   K 3.8 06/30/2015 0501   K 4.2 06/29/2015 0415   CL 102 06/30/2015 0501   CL 103 06/29/2015 0415   CO2 28 06/30/2015 0501   CO2 29 06/29/2015 0415   BUN 11 06/30/2015 0501   BUN 10 06/29/2015 0415   CREATININE 0.69 06/30/2015 0501   CREATININE 0.70 06/29/2015 0415   GLUCOSE 112* 06/30/2015 0501   GLUCOSE 124* 06/29/2015 0415   CALCIUM 8.2* 06/30/2015 0501   CALCIUM 8.1* 06/29/2015 0415     Studies/Results: No results found.  Anti-infectives: Anti-infectives    Start     Dose/Rate Route Frequency Ordered Stop   06/27/15 2000  cefoTEtan (CEFOTAN) 2 g in dextrose 5 % 50 mL IVPB     2 g 100 mL/hr over 30 Minutes Intravenous Every 12 hours 06/27/15 1442  06/27/15 2030   06/27/15 0629  cefoTEtan (CEFOTAN) 2 g in dextrose 5 % 50 mL IVPB     2 g 100 mL/hr over 30 Minutes Intravenous On call to O.R. 06/27/15 0629 06/27/15 0826      Assessment   Rectal stricture s/p LAR and diverting loop ileostomy 06/27/15-not tolerating solid diet.    LOS: 4 days   Plan: Retry soft diet.  We discussed the importance of oral hydration given the loop ileostomy.  Will leave IV out today to see if she can maintain her hydration orally.   Cathy Jones J 07/01/2015

## 2015-07-02 MED ORDER — OXYCODONE HCL 5 MG PO TABS: 5.0000 mg | ORAL_TABLET | ORAL | Status: DC | PRN

## 2015-07-02 NOTE — Discharge Summary (Signed)
Physician Discharge Summary  Patient ID: Cathy Jones MRN: [id number] DOB/AGE: [birth year] 55 y.o.  Admit date: 06/27/2015 Discharge date: 07/02/2015  Admission Diagnoses: Rectal stricture  Discharge Diagnoses:  Active Problems:   Rectal stricture   Discharged Condition: good  Hospital Course: Patient admitted after surgery.  Her diet was advanced as tolerated.  Her foley was removed on POD 2.  She began having bowel function on POD 3.  By POD 5 she was tolerating a diet and had good pain control.  She was in stable condition for discharge.  Consults: None  Significant Diagnostic Studies: labs: cbc, chemistry  Treatments: IV hydration, analgesia: acetaminophen w/ codeine and surgery: LAR  Discharge Exam: Blood pressure 105/52, pulse 78, temperature 98.4 F (36.9 C), temperature source Oral, resp. rate 16, height 5\' 2"  (1.575 m), weight 97.297 kg (214 lb 8 oz), SpO2 97 %. General appearance: alert and cooperative GI: normal findings: soft, non-tender Incision/Wound: no significant drainage  Disposition: 01-Home or Self Care     Medication List    STOP taking these medications        metroNIDAZOLE 500 MG tablet  Commonly known as:  FLAGYL     neomycin 500 MG tablet  Commonly known as:  MYCIFRADIN     polyethylene glycol packet  Commonly known as:  MIRALAX / GLYCOLAX      TAKE these medications        amoxicillin-clavulanate 875-125 MG tablet  Commonly known as:  AUGMENTIN  Take 1 tablet by mouth 2 (two) times daily. Pt began 06/13/2015 to compete on Saturday 05/23/2015     atorvastatin 10 MG tablet  Commonly known as:  LIPITOR  Take 10 mg by mouth every morning.     AZOR 5-20 MG tablet  Generic drug:  amLODipine-olmesartan  Take 1 tablet by mouth every morning.     cycloSPORINE 0.05 % ophthalmic emulsion  Commonly known as:  RESTASIS  Place 1 drop into both eyes daily as needed (dry eyes).     ibuprofen 200 MG tablet  Commonly known as:  ADVIL,MOTRIN   Take 800 mg by mouth every 6 (six) hours as needed for headache or moderate pain.     LORazepam 0.5 MG tablet  Commonly known as:  ATIVAN  Take 0.5 mg by mouth every morning.     oxyCODONE 5 MG immediate release tablet  Commonly known as:  Oxy IR/ROXICODONE  Take 1-2 tablets (5-10 mg total) by mouth every 4 (four) hours as needed for moderate pain.     PROAIR HFA 108 (90 Base) MCG/ACT inhaler  Generic drug:  albuterol  Inhale 1-2 puffs into the lungs every 4 (four) hours as needed for wheezing or shortness of breath.     sertraline 100 MG tablet  Commonly known as:  ZOLOFT  Take one tablet by mouth every morning.           Follow-up Information    Follow up with [facility].   Why:  nurse for ostomy care and teaching   Contact information:   [address] [place] [address] [phone]       Follow up with Rosario Adie., MD. Daphane Shepherd on [id number].   Specialty:  General Surgery   Why:  Arrive at 9:55 for a 10:10am apt.  Please call the office as soon as possible if you cannot make this apt.   Contact information:   [address] [place] [address] [phone]  SignedMarcello Moores, Elmo Putt C. [phone], [phone] AM

## 2015-07-02 NOTE — Consult Note (Signed)
WOC wound follow up Wound type:Surgical, closely approximated with staples intact Measurement: 11cm with no width and no depth.  Prevena discontinued. Wound [id number] Drainage (amount, consistency, odor) None Periwound:intact, no erythema, no induration, no warmth Dressing procedure/placement/frequency:LOTA until 2/26 MD followup appointment to discontinue staples.  WOC ostomy follow up Stoma type/location: RUQ ileostomy.  Red rubber catheter bridge discontinued. Stomal assessment/size: 1 and 1/2 inches round.  Skin level from 3-9 o'clock, budded from 9-3 o'clock. Red, moist Peristomal assessment: skin intact Treatment options for stomal/peristomal skin: skin barrier ring Output thin brown effluent Ostomy pouching: 1pc.convex pouch that cuts up to 1 and 1/2 inches with skin barrier ring Education provided: Discharge plan reviewed; patient will change pouch three times this week (M/W/F) with HHRN and two times next wee on Monday/Thursday to determine change frequency. Enrolled patient in [facility] Discharge program: Yes  For discharge today.  Husband in room for first time.  Thank you for allowing me to work with this nice patient. [facility] nursing team will not follow, but will remain available to this patient, the nursing, surgical and medical teams.   Thanks, Maudie Flakes, MSN, RN, [place], Arther Abbott  Pager# [phone]

## 2015-07-30 ENCOUNTER — Other Ambulatory Visit: Payer: Self-pay | Admitting: General Surgery

## 2015-07-30 ENCOUNTER — Encounter (HOSPITAL_COMMUNITY): Payer: Self-pay | Admitting: Emergency Medicine

## 2015-07-30 ENCOUNTER — Ambulatory Visit
Admission: RE | Admit: 2015-07-30 | Discharge: 2015-07-30 | Disposition: A | Discharge status: Home / Self Care | Payer: BLUE CROSS/BLUE SHIELD | Source: Ambulatory Visit | Attending: General Surgery | Admitting: General Surgery

## 2015-07-30 ENCOUNTER — Inpatient Hospital Stay (HOSPITAL_COMMUNITY)
Admission: EM | Admit: 2015-07-30 | Discharge: 2015-08-04 | DRG: 683 | Disposition: A | Discharge status: Home / Self Care | Payer: BLUE CROSS/BLUE SHIELD | Attending: Internal Medicine | Admitting: Internal Medicine

## 2015-07-30 ENCOUNTER — Telehealth: Payer: Self-pay | Admitting: General Surgery

## 2015-07-30 DIAGNOSIS — Z87891 Personal history of nicotine dependence: Secondary | ICD-10-CM

## 2015-07-30 DIAGNOSIS — K624 Stenosis of anus and rectum: Secondary | ICD-10-CM | POA: Diagnosis present

## 2015-07-30 DIAGNOSIS — D529 Folate deficiency anemia, unspecified: Secondary | ICD-10-CM | POA: Diagnosis present

## 2015-07-30 DIAGNOSIS — E785 Hyperlipidemia, unspecified: Secondary | ICD-10-CM | POA: Diagnosis present

## 2015-07-30 DIAGNOSIS — E872 Acidosis: Secondary | ICD-10-CM | POA: Diagnosis present

## 2015-07-30 DIAGNOSIS — Z800 Family history of malignant neoplasm of digestive organs: Secondary | ICD-10-CM | POA: Diagnosis not present

## 2015-07-30 DIAGNOSIS — D72829 Elevated white blood cell count, unspecified: Secondary | ICD-10-CM | POA: Diagnosis present

## 2015-07-30 DIAGNOSIS — E871 Hypo-osmolality and hyponatremia: Secondary | ICD-10-CM | POA: Diagnosis present

## 2015-07-30 DIAGNOSIS — N179 Acute kidney failure, unspecified: Principal | ICD-10-CM | POA: Diagnosis present

## 2015-07-30 DIAGNOSIS — Z90710 Acquired absence of both cervix and uterus: Secondary | ICD-10-CM | POA: Diagnosis not present

## 2015-07-30 DIAGNOSIS — E875 Hyperkalemia: Secondary | ICD-10-CM | POA: Diagnosis present

## 2015-07-30 DIAGNOSIS — E860 Dehydration: Secondary | ICD-10-CM | POA: Diagnosis present

## 2015-07-30 DIAGNOSIS — I7409 Other arterial embolism and thrombosis of abdominal aorta: Secondary | ICD-10-CM | POA: Diagnosis present

## 2015-07-30 DIAGNOSIS — Z885 Allergy status to narcotic agent status: Secondary | ICD-10-CM

## 2015-07-30 DIAGNOSIS — Z7901 Long term (current) use of anticoagulants: Secondary | ICD-10-CM

## 2015-07-30 DIAGNOSIS — Z8541 Personal history of malignant neoplasm of cervix uteri: Secondary | ICD-10-CM | POA: Diagnosis not present

## 2015-07-30 DIAGNOSIS — I959 Hypotension, unspecified: Secondary | ICD-10-CM | POA: Diagnosis present

## 2015-07-30 DIAGNOSIS — R112 Nausea with vomiting, unspecified: Secondary | ICD-10-CM | POA: Diagnosis present

## 2015-07-30 DIAGNOSIS — F419 Anxiety disorder, unspecified: Secondary | ICD-10-CM | POA: Diagnosis present

## 2015-07-30 DIAGNOSIS — Z86010 Personal history of colonic polyps: Secondary | ICD-10-CM

## 2015-07-30 DIAGNOSIS — Z932 Ileostomy status: Secondary | ICD-10-CM

## 2015-07-30 DIAGNOSIS — R198 Other specified symptoms and signs involving the digestive system and abdomen: Secondary | ICD-10-CM

## 2015-07-30 DIAGNOSIS — E878 Other disorders of electrolyte and fluid balance, not elsewhere classified: Secondary | ICD-10-CM | POA: Diagnosis present

## 2015-07-30 DIAGNOSIS — D735 Infarction of spleen: Secondary | ICD-10-CM | POA: Diagnosis not present

## 2015-07-30 DIAGNOSIS — Z9889 Other specified postprocedural states: Secondary | ICD-10-CM | POA: Insufficient documentation

## 2015-07-30 DIAGNOSIS — F329 Major depressive disorder, single episode, unspecified: Secondary | ICD-10-CM | POA: Diagnosis not present

## 2015-07-30 DIAGNOSIS — F32A Depression, unspecified: Secondary | ICD-10-CM | POA: Diagnosis present

## 2015-07-30 DIAGNOSIS — I10 Essential (primary) hypertension: Secondary | ICD-10-CM | POA: Diagnosis present

## 2015-07-30 DIAGNOSIS — Z79899 Other long term (current) drug therapy: Secondary | ICD-10-CM | POA: Diagnosis not present

## 2015-07-30 DIAGNOSIS — J45909 Unspecified asthma, uncomplicated: Secondary | ICD-10-CM | POA: Diagnosis present

## 2015-07-30 DIAGNOSIS — D519 Vitamin B12 deficiency anemia, unspecified: Secondary | ICD-10-CM | POA: Diagnosis not present

## 2015-07-30 DIAGNOSIS — Z9049 Acquired absence of other specified parts of digestive tract: Secondary | ICD-10-CM

## 2015-07-30 DIAGNOSIS — N19 Unspecified kidney failure: Secondary | ICD-10-CM

## 2015-07-30 DIAGNOSIS — D539 Nutritional anemia, unspecified: Secondary | ICD-10-CM | POA: Diagnosis present

## 2015-07-30 DIAGNOSIS — I7411 Embolism and thrombosis of thoracic aorta: Secondary | ICD-10-CM | POA: Diagnosis present

## 2015-07-30 DIAGNOSIS — E782 Mixed hyperlipidemia: Secondary | ICD-10-CM | POA: Diagnosis present

## 2015-07-30 LAB — CBC WITH DIFFERENTIAL/PLATELET
BASOS ABS: 0 10*3/uL (ref 0.0–0.1)
BASOS PCT: 0 %
EOS ABS: 0 10*3/uL (ref 0.0–0.7)
EOS PCT: 0 %
HCT: 37.5 % (ref 36.0–46.0)
HEMOGLOBIN: 12.3 g/dL (ref 12.0–15.0)
LYMPHS ABS: 1.9 10*3/uL (ref 0.7–4.0)
Lymphocytes Relative: 10 %
MCH: 26.7 pg (ref 26.0–34.0)
MCHC: 32.8 g/dL (ref 30.0–36.0)
MCV: 81.3 fL (ref 78.0–100.0)
Monocytes Absolute: 0.9 10*3/uL (ref 0.1–1.0)
Monocytes Relative: 5 %
NEUTROS PCT: 85 %
Neutro Abs: 15.8 10*3/uL — ABNORMAL HIGH (ref 1.7–7.7)
PLATELETS: 349 10*3/uL (ref 150–400)
RBC: 4.61 MIL/uL (ref 3.87–5.11)
RDW: 14.3 % (ref 11.5–15.5)
WBC: 18.6 10*3/uL — AB (ref 4.0–10.5)

## 2015-07-30 LAB — COMPREHENSIVE METABOLIC PANEL
ALT: 9 U/L — AB (ref 14–54)
ANION GAP: 22 — AB (ref 5–15)
AST: 9 U/L — ABNORMAL LOW (ref 15–41)
Albumin: 4.8 g/dL (ref 3.5–5.0)
Alkaline Phosphatase: 178 U/L — ABNORMAL HIGH (ref 38–126)
BUN: 100 mg/dL — ABNORMAL HIGH (ref 6–20)
CHLORIDE: 96 mmol/L — AB (ref 101–111)
CO2: 12 mmol/L — AB (ref 22–32)
CREATININE: 5.82 mg/dL — AB (ref 0.44–1.00)
Calcium: 10 mg/dL (ref 8.9–10.3)
GFR calc non Af Amer: 8 mL/min — ABNORMAL LOW (ref >60)
GFR, EST AFRICAN AMERICAN: 9 mL/min — AB (ref >60)
Glucose, Bld: 157 mg/dL — ABNORMAL HIGH (ref 65–99)
POTASSIUM: 6 mmol/L — AB (ref 3.5–5.1)
SODIUM: 130 mmol/L — AB (ref 135–145)
Total Bilirubin: 0.6 mg/dL (ref 0.3–1.2)
Total Protein: 8.8 g/dL — ABNORMAL HIGH (ref 6.5–8.1)

## 2015-07-30 MED ORDER — SODIUM CHLORIDE 0.9 % IV BOLUS (SEPSIS)
1000.0000 mL | Freq: Once | INTRAVENOUS | Status: AC
Start: 2015-07-30 — End: 2015-07-31
  Administered 2015-07-30: 1000 mL via INTRAVENOUS

## 2015-07-30 MED ORDER — HEPARIN BOLUS VIA INFUSION
2000.0000 [IU] | Freq: Once | INTRAVENOUS | Status: AC
  Administered 2015-07-31: 2000 [IU] via INTRAVENOUS
  Filled 2015-07-30: qty 2000

## 2015-07-30 MED ORDER — MORPHINE SULFATE (PF) 4 MG/ML IV SOLN: 4.0000 mg | Freq: Once | INTRAVENOUS | Status: DC

## 2015-07-30 MED ORDER — HEPARIN (PORCINE) IN NACL 100-0.45 UNIT/ML-% IJ SOLN
1200.0000 [IU]/h | INTRAMUSCULAR | Status: DC
  Administered 2015-07-31 (×2): 1200 [IU]/h via INTRAVENOUS
  Filled 2015-07-30 (×3): qty 250

## 2015-07-30 MED ORDER — HYDROMORPHONE HCL 1 MG/ML IJ SOLN
1.0000 mg | INTRAMUSCULAR | Status: DC | PRN
  Administered 2015-07-31 – 2015-08-01 (×2): 1 mg via INTRAVENOUS
  Filled 2015-07-30 (×2): qty 1

## 2015-07-30 MED ORDER — ONDANSETRON HCL 4 MG/2ML IJ SOLN
4.0000 mg | Freq: Four times a day (QID) | INTRAMUSCULAR | Status: DC | PRN
  Administered 2015-07-31 (×2): 4 mg via INTRAVENOUS
  Filled 2015-07-30 (×2): qty 2

## 2015-07-30 MED ORDER — PANTOPRAZOLE SODIUM 40 MG IV SOLR
40.0000 mg | INTRAVENOUS | Status: DC
  Administered 2015-07-30: 40 mg via INTRAVENOUS
  Filled 2015-07-30: qty 40

## 2015-07-30 MED ORDER — IOPAMIDOL (ISOVUE-300) INJECTION 61%
100.0000 mL | Freq: Once | INTRAVENOUS | Status: AC | PRN
  Administered 2015-07-30: 100 mL via INTRAVENOUS

## 2015-07-30 NOTE — Telephone Encounter (Signed)
Received word from lab that cr, k and bun were elevated.  Called and spoke to pt around 6pm.  Gave her the results and told her to come to ED immediately for treatment.  Pt expressed understanding.

## 2015-07-30 NOTE — ED Notes (Signed)
Pt states she had an ileostomy placed on 2/15 and has been feeling nauseated and unable to keep down solid foods. Saw surgeon who stated that her kidney functions were elevated and that she needed to come in the the ED. Alert and oriented.

## 2015-07-30 NOTE — ED Provider Notes (Signed)
CSN: [id number]     Arrival date & time 07/30/15  2031 History   First MD Initiated Contact with Patient 07/30/15 2230     Chief Complaint  Patient presents with  . Abnormal Lab     (Consider location/radiation/quality/duration/timing/severity/associated sxs/prior Treatment) Patient is a 55 y.o. female presenting with vomiting. The history is provided by the patient (The patient states that she has been vomiting for 10 days now. She was sent over to the emergency department from her surgeon's office for renal failure).  Emesis Severity:  Moderate Timing:  Constant Quality:  Bilious material Able to tolerate:  Liquids Progression:  Unchanged Chronicity:  New Recent urination:  Decreased Relieved by:  Nothing Associated symptoms: no abdominal pain, no diarrhea and no headaches     Past Medical History  Diagnosis Date  . Hypertension   . Hyperlipidemia   . Cervical cancer ([place])   . Asthma     pt brought her proair inhaler 06-06-14  . Colon polyp     hyperplastic  . Colon stricture ([place])   . Colonic obstruction ([place])   . Family history of adverse reaction to anesthesia     pts mother experiences nausea and vomiting   . Shortness of breath dyspnea     seasonal   . History of bronchitis   . Depression   . Headache     migraines  . History of hiatal hernia   . Ear infection     left   . Sinus infection    Past Surgical History  Procedure Laterality Date  . Abdominal hysterectomy  1994  . Colonoscopy    . Upper gastrointestinal endoscopy    . Tubal ligation    . Colon resection N/A 06/27/2015    Procedure: LYSIS OF ADHESIONS LOW ANTERIOR RESECTION DIVERTING LOOP ILEOSTOMY;  Surgeon: Leighton Ruff, MD;  Location: [facility];  Service: General;  Laterality: N/A;  . Diverting ileostomy N/A 06/27/2015    Procedure: DIVERTING LOOP ILEOSTOMY;  Surgeon: Leighton Ruff, MD;  Location: [facility];  Service: General;  Laterality: N/A;  . Cystoscopy with stent placement Bilateral 06/27/2015     Procedure: CYSTOSCOPY WITH CATHETER  PLACEMENT;  Surgeon: Franchot Gallo, MD;  Location: [facility];  Service: Urology;  Laterality: Bilateral;   Family History  Problem Relation Age of Onset  . Colon cancer Neg Hx   . Stomach cancer Neg Hx   . Esophageal cancer Neg Hx   . Rectal cancer Cousin    Social History  Substance Use Topics  . Smoking status: Former Smoker -- 0.50 packs/day for 40 years    Types: Cigarettes    Quit date: 06/08/2014  . Smokeless tobacco: Never Used  . Alcohol Use: Yes     Comment: occas   OB History    No data available     Review of Systems  Constitutional: Negative for appetite change and fatigue.  HENT: Negative for congestion, ear discharge and sinus pressure.   Eyes: Negative for discharge.  Respiratory: Negative for cough.   Cardiovascular: Negative for chest pain.  Gastrointestinal: Positive for vomiting. Negative for abdominal pain and diarrhea.  Genitourinary: Negative for frequency and hematuria.  Musculoskeletal: Negative for back pain.  Skin: Negative for rash.  Neurological: Negative for seizures and headaches.  Psychiatric/Behavioral: Negative for hallucinations.      Allergies  Codeine  Home Medications   Prior to Admission medications   Medication Sig Start Date End Date Taking? Authorizing Provider  amLODipine-olmesartan (AZOR) 5-20 MG  per tablet Take 1 tablet by mouth every morning.    Yes Historical Provider, MD  atorvastatin (LIPITOR) 10 MG tablet Take 10 mg by mouth every morning.   Yes Historical Provider, MD  LORazepam (ATIVAN) 0.5 MG tablet Take 0.5 mg by mouth every morning.   Yes Historical Provider, MD  PROAIR HFA 108 (90 BASE) MCG/ACT inhaler Inhale 1-2 puffs into the lungs every 4 (four) hours as needed for wheezing or shortness of breath.  05/06/14  Yes Historical Provider, MD  sertraline (ZOLOFT) 100 MG tablet Take one tablet by mouth every morning. 02/19/15  Yes Historical Provider, MD  ELIQUIS 5 MG TABS  tablet Take 5 mg by mouth as directed. 07/30/15   Historical Provider, MD  oxyCODONE (OXY IR/ROXICODONE) 5 MG immediate release tablet Take 1-2 tablets (5-10 mg total) by mouth every 4 (four) hours as needed for moderate pain. Patient not taking: Reported on [id number]   Leighton Ruff, MD   BP 139/89 mmHg  Pulse 111  Temp(Src) 97.8 F (36.6 C) (Oral)  Resp 18  Ht 5\' 2"  (1.575 m)  Wt 188 lb (85.276 kg)  BMI 34.38 kg/m2  SpO2 98% Physical Exam  Constitutional: She is oriented to person, place, and time. She appears well-developed.  HENT:  Head: Normocephalic.  Dry mucous membranes  Eyes: Conjunctivae and EOM are normal. No scleral icterus.  Neck: Neck supple. No thyromegaly present.  Cardiovascular: Normal rate.  Exam reveals no gallop and no friction rub.   No murmur heard. Tachycardic  Pulmonary/Chest: No stridor. She has no wheezes. She has no rales. She exhibits no tenderness.  Abdominal: She exhibits no distension. There is no tenderness. There is no rebound.  Musculoskeletal: Normal range of motion. She exhibits no edema.  Lymphadenopathy:    She has no cervical adenopathy.  Neurological: She is oriented to person, place, and time. She exhibits normal muscle tone. Coordination normal.  Skin: No rash noted. No erythema.  Psychiatric: She has a normal mood and affect. Her behavior is normal.    ED Course  Procedures (including critical care time) Labs Review Labs Reviewed  CBC WITH DIFFERENTIAL/PLATELET - Abnormal; Notable for the following:    WBC 18.6 (*)    Neutro Abs 15.8 (*)    All other components within normal limits  COMPREHENSIVE METABOLIC PANEL - Abnormal; Notable for the following:    Sodium 130 (*)    Potassium 6.0 (*)    Chloride 96 (*)    CO2 12 (*)    Glucose, Bld 157 (*)    BUN 100 (*)    Creatinine, Ser 5.82 (*)    Total Protein 8.8 (*)    AST 9 (*)    ALT 9 (*)    Alkaline Phosphatase 178 (*)    GFR calc non Af Amer 8 (*)    GFR calc Af  Amer 9 (*)    Anion gap 22 (*)    All other components within normal limits    Imaging Review Ct Abdomen Pelvis W Contrast  07/30/2015  CLINICAL DATA:  55 year old female with nausea and vomiting for 1 week. Unintentional 50 lbs. weight loss. Mid abdominal pain. History of History of cervical cancer status post hysterectomy. More recent history of short-segment sigmoid colon stricture treated surgically. EXAM: CT ABDOMEN AND PELVIS WITH CONTRAST TECHNIQUE: Multidetector CT imaging of the abdomen and pelvis was performed using the standard protocol following bolus administration of intravenous contrast. CONTRAST:  118mL ISOVUE-300 IOPAMIDOL (ISOVUE-300) INJECTION 61% COMPARISON:  CT virtual colonoscopy 07/19/2014. CT Abdomen and Pelvis 06/13/2014. FINDINGS: Lung bases are stable since 2016 and negative. No pericardial or pleural effusion. Stable mild elevation of the left hemidiaphragm. Stable visualized osseous structures. L3 limbus vertebra. Chronic multilevel vertebral endplate irregularity most pronounced at L4. No acute or suspicious osseous lesion identified. Since March 2016 the patient has undergone ventral abdominal surgery with right abdominal ileostomy. There is new confluent presacral stranding. Chronic retroperitoneal and pelvic sidewall dissection with numerous surgical clips otherwise appears stable. Evidence of a sigmoid colectomy with staple line at what appears to be a sigmoid to rectum anastomosis (series 3, image 69). Along the right pelvic sidewall lateral to the apparent anastomosis there is an 18 x 42 mm fluid collection or less likely residual fluid-filled bowel loop (series 3, image 68). The uterus and adnexa are chronically surgically absent. There is a small volume of gas within the vagina. The urinary bladder is completely decompressed today. The left colon is decompressed. The transverse colon is decompressed. The right colon is decompressed. The distal small bowel is decompressed.  The ileostomy has no adverse features there. No dilated small bowel loops. Is mild fluid distension of the stomach no pneumoperitoneum or abdominal free fluid. Liver, gallbladder, pancreas and adrenal glands are within normal limits. Portal venous system is patent. The spleen is remarkable for a mildly lobulated but wedge-shaped and peripheral hypodense area along the posterior margin which may be new since 2016. No surrounding inflammation. More subtle peripheral wedge-shaped hypodense area also on image 19 along the anterior contour, and image 12 at the superior pole of the spleen. No left upper quadrant fluid. Extensive Aortoiliac calcified atherosclerosis noted. Major arterial structures appear to remain patent. However, there is newly seen low-density bulky mural plaque or thrombus along the posterior wall of the descending thoracic aorta measuring 6 x 11 x 10 mm (AP by transverse by CC), new since 2016. Stable residual small retroperitoneal lymph nodes. Bilateral renal enhancement is symmetric and within normal limits. However, despite up to 7 minute delayed imaging through the kidneys no renal contrast excretion occurred. However, at the same time there is no hydronephrosis or hydroureter. Both proximal ureters are diminutive. Both track into the postoperative retroperitoneal changes an are poorly delineated distally. There is no perinephric stranding. The interval postoperative changes to the ventral abdominal wall are noted with no adverse features. IMPRESSION: 1. Evidence of multiple small splenic infarcts. Associated new soft mural plaque or thrombus in the descending thoracic aorta (series 3, image 3) suspicious for embolic source. Recommend vascular surgery consultation. 2. Interval postoperative changes to the pelvis, including apparent partial sigmoidectomy and reanastomosis. There is an elongated extraluminal fluid collection beginning posterior to the anastomosis site (in the midline presacral  space) and tracking along the posterior right pelvic sidewall. This is of unclear etiology and significance. 3. Suspect acute renal insufficiency on the basis of intrinsic renal disease suspected in light of no contrast excretion to from the kidneys up to 7 minutes following injection. No evidence of obstructive uropathy. 4. New right abdominal ileostomy with no bowel obstruction. The above was discussed by telephone with Dr. Leighton Ruff on [id number] at 15:14 . Electronically Signed   By: Genevie Ann M.D.   On: 07/30/2015 15:15   I have personally reviewed and evaluated these images and lab results as part of my medical decision-making.   EKG Interpretation   Date/Time:  Monday July 30 2015 22:53:23 EDT Ventricular Rate:  89 PR Interval:  158 QRS  Duration: 95 QT Interval:  358 QTC Calculation: 436 R Axis:   65 Text Interpretation:  Sinus rhythm Low voltage, precordial leads  Anteroseptal infarct, old Confirmed by Randee Huston  MD, Gerron Guidotti [phone]) on  07/30/2015 10:59:00 PM     CRITICAL CARE Performed by: Camey Edell L Total critical care time: 35 minutes Critical care time was exclusive of separately billable procedures and treating other patients. Critical care was necessary to treat or prevent imminent or life-threatening deterioration. Critical care was time spent personally by me on the following activities: development of treatment plan with patient and/or surrogate as well as nursing, discussions with consultants, evaluation of patient's response to treatment, examination of patient, obtaining history from patient or surrogate, ordering and performing treatments and interventions, ordering and review of laboratory studies, ordering and review of radiographic studies, pulse oximetry and re-evaluation of patient's condition.  MDM   Final diagnoses:  Renal failure    Patient with persistent vomiting for 10 days. She had sigmoid colon removed 1 month ago. Patient had a CT scan today done  by her surgeon that shows splenic infarcts. It also shows thrombus in the thoracic aorta. Patient will be hydrated significantly and started on heparin will be admitted to medicine    Milton Ferguson, MD 07/30/15 2306

## 2015-07-30 NOTE — H&P (Signed)
[facility] History and Physical  Cathy Jones [id number] DOB: [birth year] DOA: 07/30/2015  Referring physician: Milton Ferguson, MD PCP: Tula Nakayama   Chief Complaint: Abnormal lab results.  HPI: Cathy Jones is a 55 y.o. female with a past medical history of hypertension, hyperlipidemia, cervical cancer, asthma, colon stricture/colon obstruction with the status of partial colon resection/ diverging ileostomy on 06/27/2015 who comes to the emergency department referred by general surgery Leighton Ruff, M.D.) due to abnormal labs and CT scan results.  Per patient, she was doing well after surgery, but in the last week and a half she has developed abdominal pain, which was followed by nausea and emesis to solids, now with nausea and emesis even with small to moderate water ingestion. She denies fever, hematemesis, melena, hematochezia or GU symptoms.   She went to see Dr. Marcello Moores today, who ordered some labs, and subsequently called her to ask her to come to the emergency department for further evaluation and treatment. 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    Review of Systems:  Constitutional:  Positive chills, fatigue.  No weight loss, night sweats, Fevers HEENT:  No headaches, Difficulty swallowing,Tooth/dental problems,Sore throat,  No sneezing, itching, ear ache, nasal congestion, post nasal drip,  Cardio-vascular:  No chest pain, Orthopnea, PND, swelling in lower extremities, anasarca, dizziness, palpitations  GI:  As above mentioned. Resp:  No shortness of breath with exertion or at rest. No excess mucus, no productive cough, No non-productive cough, No coughing up of blood.No change in color of mucus.No wheezing.No chest wall deformity  Skin:  no rash or lesions.  GU:  Denies oliguria, dysuria, change in color of urine, no urgency or frequency. No flank pain.    Musculoskeletal:  No joint pain or swelling. No decreased range of motion. No back pain.  Psych:  No change in mood or affect. No depression or anxiety. No memory loss.   Past Medical History  Diagnosis Date  . Hypertension   . Hyperlipidemia   . Cervical cancer ([place])   . Asthma     pt brought her proair inhaler 06-06-14  . Colon polyp     hyperplastic  . Colon stricture ([place])   . Colonic obstruction ([place])   . Family history of adverse reaction to anesthesia     pts mother experiences nausea and vomiting   . Shortness of breath dyspnea     seasonal   . History of bronchitis   . Depression   . Headache     migraines  . History of hiatal hernia   . Ear infection     left   . Sinus infection    Past Surgical History  Procedure Laterality Date  . Abdominal hysterectomy  1994  . Colonoscopy    . Upper gastrointestinal endoscopy    . Tubal ligation    . Colon resection N/A 06/27/2015    Procedure: LYSIS OF ADHESIONS LOW ANTERIOR RESECTION DIVERTING LOOP ILEOSTOMY;  Surgeon: Leighton Ruff, MD;  Location: [facility];  Service: General;  Laterality: N/A;  . Diverting ileostomy N/A 06/27/2015    Procedure: DIVERTING LOOP ILEOSTOMY;  Surgeon: Leighton Ruff, MD;  Location: [facility];  Service: General;  Laterality: N/A;  . Cystoscopy with stent placement Bilateral 06/27/2015    Procedure: CYSTOSCOPY WITH CATHETER  PLACEMENT;  Surgeon: Franchot Gallo, MD;  Location: [facility];  Service: Urology;  Laterality: Bilateral;   Social  History:  reports that she quit smoking about 13 months ago. Her smoking use included Cigarettes. She has a 20 pack-year smoking history. She has never used smokeless tobacco. She reports that she drinks alcohol. She reports that she uses illicit drugs.  Allergies  Allergen Reactions  . Codeine Other (See Comments)    "hyper"    Family History  Problem Relation Age of Onset  . Colon cancer Neg Hx   . Stomach cancer Neg Hx   . Esophageal cancer Neg Hx   .  Rectal cancer Cousin     Prior to Admission medications   Medication Sig Start Date End Date Taking? Authorizing Provider  amLODipine-olmesartan (AZOR) 5-20 MG per tablet Take 1 tablet by mouth every morning.    Yes Historical Provider, MD  atorvastatin (LIPITOR) 10 MG tablet Take 10 mg by mouth every morning.   Yes Historical Provider, MD  LORazepam (ATIVAN) 0.5 MG tablet Take 0.5 mg by mouth every morning.   Yes Historical Provider, MD  PROAIR HFA 108 (90 BASE) MCG/ACT inhaler Inhale 1-2 puffs into the lungs every 4 (four) hours as needed for wheezing or shortness of breath.  05/06/14  Yes Historical Provider, MD  sertraline (ZOLOFT) 100 MG tablet Take one tablet by mouth every morning. 02/19/15  Yes Historical Provider, MD  ELIQUIS 5 MG TABS tablet Take 5 mg by mouth as directed. 07/30/15   Historical Provider, MD  oxyCODONE (OXY IR/ROXICODONE) 5 MG immediate release tablet Take 1-2 tablets (5-10 mg total) by mouth every 4 (four) hours as needed for moderate pain. Patient not taking: Reported on [id number]   Leighton Ruff, MD   Physical Exam: Filed Vitals:   07/30/15 2055  BP: 139/89  Pulse: 111  Temp: 97.8 F (36.6 C)  TempSrc: Oral  Resp: 18  Height: 5\' 2"  (1.575 m)  Weight: 85.276 kg (188 lb)  SpO2: 98%    Wt Readings from Last 3 Encounters:  07/30/15 85.276 kg (188 lb)  06/27/15 97.297 kg (214 lb 8 oz)  06/21/15 97.297 kg (214 lb 8 oz)    General:  Looks chronically ill, but otherwise appears calm and comfortable Eyes: PERRL, normal lids, irises & conjunctiva ENT: grossly normal hearing, lips & tongue. Oral mucosa is very dry Neck: no LAD, masses or thyromegaly Cardiovascular: Tachycardic at 104 bpm, no m/r/g. No LE edema. Telemetry: Tachycardic at 104 bpm Respiratory: CTA bilaterally, no w/r/r. Normal respiratory effort. Abdomen: Ileostomy in place, BS +, soft, positive periumbilical/LUQ tenderness, no guarding, no rebound. Skin: Skin was dry. Musculoskeletal:  grossly normal tone BUE/BLE Psychiatric: grossly normal mood and affect, speech fluent and appropriate Neurologic: Awake, alert, oriented 3, grossly non-focal.          Labs on Admission:  Basic Metabolic Panel:  Recent Labs Lab 07/30/15 2111  NA 130*  K 6.0*  CL 96*  CO2 12*  GLUCOSE 157*  BUN 100*  CREATININE 5.82*  CALCIUM 10.0   Liver Function Tests:  Recent Labs Lab 07/30/15 2111  AST 9*  ALT 9*  ALKPHOS 178*  BILITOT 0.6  PROT 8.8*  ALBUMIN 4.8   CBC:  Recent Labs Lab 07/30/15 2111  WBC 18.6*  NEUTROABS 15.8*  HGB 12.3  HCT 37.5  MCV 81.3  PLT 349    Radiological Exams on Admission: Ct Abdomen Pelvis W Contrast  07/30/2015  CLINICAL DATA:  55 year old female with nausea and vomiting for 1 week. Unintentional 50 lbs. weight loss. Mid abdominal pain. History of History of  cervical cancer status post hysterectomy. More recent history of short-segment sigmoid colon stricture treated surgically. EXAM: CT ABDOMEN AND PELVIS WITH CONTRAST TECHNIQUE: Multidetector CT imaging of the abdomen and pelvis was performed using the standard protocol following bolus administration of intravenous contrast. CONTRAST:  144mL ISOVUE-300 IOPAMIDOL (ISOVUE-300) INJECTION 61% COMPARISON:  CT virtual colonoscopy 07/19/2014. CT Abdomen and Pelvis 06/13/2014. FINDINGS: Lung bases are stable since 2016 and negative. No pericardial or pleural effusion. Stable mild elevation of the left hemidiaphragm. Stable visualized osseous structures. L3 limbus vertebra. Chronic multilevel vertebral endplate irregularity most pronounced at L4. No acute or suspicious osseous lesion identified. Since March 2016 the patient has undergone ventral abdominal surgery with right abdominal ileostomy. There is new confluent presacral stranding. Chronic retroperitoneal and pelvic sidewall dissection with numerous surgical clips otherwise appears stable. Evidence of a sigmoid colectomy with staple line at what  appears to be a sigmoid to rectum anastomosis (series 3, image 69). Along the right pelvic sidewall lateral to the apparent anastomosis there is an 18 x 42 mm fluid collection or less likely residual fluid-filled bowel loop (series 3, image 68). The uterus and adnexa are chronically surgically absent. There is a small volume of gas within the vagina. The urinary bladder is completely decompressed today. The left colon is decompressed. The transverse colon is decompressed. The right colon is decompressed. The distal small bowel is decompressed. The ileostomy has no adverse features there. No dilated small bowel loops. Is mild fluid distension of the stomach no pneumoperitoneum or abdominal free fluid. Liver, gallbladder, pancreas and adrenal glands are within normal limits. Portal venous system is patent. The spleen is remarkable for a mildly lobulated but wedge-shaped and peripheral hypodense area along the posterior margin which may be new since 2016. No surrounding inflammation. More subtle peripheral wedge-shaped hypodense area also on image 19 along the anterior contour, and image 12 at the superior pole of the spleen. No left upper quadrant fluid. Extensive Aortoiliac calcified atherosclerosis noted. Major arterial structures appear to remain patent. However, there is newly seen low-density bulky mural plaque or thrombus along the posterior wall of the descending thoracic aorta measuring 6 x 11 x 10 mm (AP by transverse by CC), new since 2016. Stable residual small retroperitoneal lymph nodes. Bilateral renal enhancement is symmetric and within normal limits. However, despite up to 7 minute delayed imaging through the kidneys no renal contrast excretion occurred. However, at the same time there is no hydronephrosis or hydroureter. Both proximal ureters are diminutive. Both track into the postoperative retroperitoneal changes an are poorly delineated distally. There is no perinephric stranding. The interval  postoperative changes to the ventral abdominal wall are noted with no adverse features. IMPRESSION: 1. Evidence of multiple small splenic infarcts. Associated new soft mural plaque or thrombus in the descending thoracic aorta (series 3, image 3) suspicious for embolic source. Recommend vascular surgery consultation. 2. Interval postoperative changes to the pelvis, including apparent partial sigmoidectomy and reanastomosis. There is an elongated extraluminal fluid collection beginning posterior to the anastomosis site (in the midline presacral space) and tracking along the posterior right pelvic sidewall. This is of unclear etiology and significance. 3. Suspect acute renal insufficiency on the basis of intrinsic renal disease suspected in light of no contrast excretion to from the kidneys up to 7 minutes following injection. No evidence of obstructive uropathy. 4. New right abdominal ileostomy with no bowel obstruction. The above was discussed by telephone with Dr. Leighton Ruff on [id number] at 15:14 . Electronically Signed  By: Genevie Ann M.D.   On: 07/30/2015 15:15    EKG: Independently reviewed. Vent. rate 89 BPM PR interval 158 ms QRS duration 95 ms QT/QTc 358/436 ms P-R-T axes 7 65 92 Sinus rhythm Low voltage, precordial leads Anteroseptal infarct, old  Assessment/Plan Principal Problem:   Acute renal failure (ARF) (HCC) Admit to a stepdown. Continue IV hydration. Check urine spot lites Monitor BUN/creatinine and electrolytes. Consider expanding workup and/or renal evaluation if no improvement.  Active Problems:   Abdominal aorta thrombosis (HCC)   Splenic infarct This could be the likely source of pain. Keep nothing by mouth for now Continue heparin infusion. Consider vascular surgery evaluation in a.m.      Hyponatremia Secondary to GI losses. Continue IV hydration with normal saline. Follow-up sodium level in a.m.     Hyperkalemia Secondary to dehydration and ARF. Continue  fluid challenge and monitor potassium.    S/P ileostomy (HCC)   S/P partial resection of colon stable. The patient was followed by Dr. Marcello Moores earlier today.      Hypertension   Hyperlipidemia   Depression Resume oral medications once the patient is tolerating by mouth.    Code Status: Full code. DVT Prophylaxis: On full dose heparin. Family Communication: Her spouse and mother were present in the room. Disposition Plan: Admit for rehydration and IV heparin treatment.  Time spent: Over 70 minutes or use in the process of his admission.  Reubin Milan, M.D. [facility] Pager [phone].

## 2015-07-31 ENCOUNTER — Inpatient Hospital Stay (HOSPITAL_COMMUNITY): Payer: BLUE CROSS/BLUE SHIELD

## 2015-07-31 ENCOUNTER — Encounter (HOSPITAL_COMMUNITY): Payer: Self-pay | Admitting: Internal Medicine

## 2015-07-31 DIAGNOSIS — I10 Essential (primary) hypertension: Secondary | ICD-10-CM

## 2015-07-31 LAB — CREATININE, URINE, RANDOM: Creatinine, Urine: 107.72 mg/dL

## 2015-07-31 LAB — CBC WITH DIFFERENTIAL/PLATELET
Basophils Absolute: 0 10*3/uL (ref 0.0–0.1)
Basophils Relative: 0 %
EOS ABS: 0 10*3/uL (ref 0.0–0.7)
EOS PCT: 0 %
HCT: 33.5 % — ABNORMAL LOW (ref 36.0–46.0)
HEMOGLOBIN: 11 g/dL — AB (ref 12.0–15.0)
LYMPHS ABS: 2.2 10*3/uL (ref 0.7–4.0)
Lymphocytes Relative: 19 %
MCH: 26.8 pg (ref 26.0–34.0)
MCHC: 32.8 g/dL (ref 30.0–36.0)
MCV: 81.5 fL (ref 78.0–100.0)
MONO ABS: 0.8 10*3/uL (ref 0.1–1.0)
MONOS PCT: 7 %
Neutro Abs: 8.8 10*3/uL — ABNORMAL HIGH (ref 1.7–7.7)
Neutrophils Relative %: 74 %
Platelets: 251 10*3/uL (ref 150–400)
RBC: 4.11 MIL/uL (ref 3.87–5.11)
RDW: 14.3 % (ref 11.5–15.5)
WBC: 11.8 10*3/uL — ABNORMAL HIGH (ref 4.0–10.5)

## 2015-07-31 LAB — BASIC METABOLIC PANEL
Anion gap: 15 (ref 5–15)
BUN: 89 mg/dL — AB (ref 6–20)
CHLORIDE: 104 mmol/L (ref 101–111)
CO2: 17 mmol/L — ABNORMAL LOW (ref 22–32)
Calcium: 9.3 mg/dL (ref 8.9–10.3)
Creatinine, Ser: 3.12 mg/dL — ABNORMAL HIGH (ref 0.44–1.00)
GFR calc Af Amer: 18 mL/min — ABNORMAL LOW (ref >60)
GFR calc non Af Amer: 16 mL/min — ABNORMAL LOW (ref >60)
Glucose, Bld: 104 mg/dL — ABNORMAL HIGH (ref 65–99)
POTASSIUM: 4.9 mmol/L (ref 3.5–5.1)
SODIUM: 136 mmol/L (ref 135–145)

## 2015-07-31 LAB — NA AND K (SODIUM & POTASSIUM), RAND UR
Potassium Urine: 23 mmol/L
SODIUM UR: 19 mmol/L

## 2015-07-31 LAB — PROTIME-INR
INR: 1.2 (ref 0.00–1.49)
PROTHROMBIN TIME: 15.4 s — AB (ref 11.6–15.2)

## 2015-07-31 LAB — HEPARIN LEVEL (UNFRACTIONATED)
Heparin Unfractionated: 0.34 IU/mL (ref 0.30–0.70)
Heparin Unfractionated: 0.25 IU/mL — ABNORMAL LOW (ref 0.30–0.70)

## 2015-07-31 LAB — APTT: APTT: 28 s (ref 24–37)

## 2015-07-31 LAB — PROTEIN, URINE, RANDOM: Total Protein, Urine: 29 mg/dL

## 2015-07-31 MED ORDER — ATORVASTATIN CALCIUM 10 MG PO TABS
10.0000 mg | ORAL_TABLET | Freq: Every day | ORAL | Status: DC
  Administered 2015-07-31 – 2015-08-03 (×3): 10 mg via ORAL
  Filled 2015-07-31 (×3): qty 1

## 2015-07-31 MED ORDER — LORAZEPAM 2 MG/ML IJ SOLN: 0.5000 mg | INTRAMUSCULAR | Status: DC | PRN

## 2015-07-31 MED ORDER — HEPARIN (PORCINE) IN NACL 100-0.45 UNIT/ML-% IJ SOLN
1400.0000 [IU]/h | INTRAMUSCULAR | Status: DC
  Administered 2015-08-01: 1400 [IU]/h via INTRAVENOUS
  Filled 2015-07-31: qty 250

## 2015-07-31 MED ORDER — PANTOPRAZOLE SODIUM 40 MG IV SOLR
40.0000 mg | Freq: Every day | INTRAVENOUS | Status: DC
  Administered 2015-07-31 – 2015-08-01 (×2): 40 mg via INTRAVENOUS
  Filled 2015-07-31 (×2): qty 40

## 2015-07-31 MED ORDER — LOPERAMIDE HCL 2 MG PO CAPS
4.0000 mg | ORAL_CAPSULE | Freq: Three times a day (TID) | ORAL | Status: DC
  Administered 2015-07-31 – 2015-08-01 (×4): 4 mg via ORAL
  Filled 2015-07-31 (×4): qty 2

## 2015-07-31 MED ORDER — SODIUM CHLORIDE 0.9 % IV SOLN
INTRAVENOUS | Status: DC
  Administered 2015-07-31 – 2015-08-03 (×10): via INTRAVENOUS

## 2015-07-31 MED ORDER — SERTRALINE HCL 100 MG PO TABS
100.0000 mg | ORAL_TABLET | Freq: Every morning | ORAL | Status: DC
  Administered 2015-08-01 – 2015-08-04 (×4): 100 mg via ORAL
  Filled 2015-07-31 (×4): qty 1

## 2015-07-31 MED ORDER — LORAZEPAM 0.5 MG PO TABS
0.5000 mg | ORAL_TABLET | Freq: Every morning | ORAL | Status: DC
  Administered 2015-08-01 – 2015-08-04 (×4): 0.5 mg via ORAL
  Filled 2015-07-31 (×4): qty 1

## 2015-07-31 MED ORDER — SODIUM POLYSTYRENE SULFONATE 15 GM/60ML PO SUSP
30.0000 g | Freq: Once | ORAL | Status: AC
Start: 2015-07-31 — End: 2015-07-31
  Administered 2015-07-31: 30 g via ORAL
  Filled 2015-07-31: qty 120

## 2015-07-31 NOTE — ED Notes (Signed)
[place] called and they are on their way.

## 2015-07-31 NOTE — Progress Notes (Addendum)
Patient Demographics  Cathy Jones, is a 55 y.o. female, DOB - 01-26-1961, YE:[id number]  Admit date - 07/30/2015   Admitting Physician Cathy Milan, MD  Outpatient Primary MD for the patient is Cathy Jones  LOS - 1   Chief Complaint  Patient presents with  . Abnormal Lab       Admission HPI/Brief narrative: 55 y.o. female with a past medical history of hypertension, hyperlipidemia, cervical cancer, asthma, colon stricture/colon obstruction with the status of partial colon resection/ diverging ileostomy on 06/27/2015 who comes to the emergency department referred by general surgery Cathy Jones, M.D.) due to abnormal labs and CT scan results - Sent in acute renal failure, hyperkalemia, this is most likely due to volume depletion, improving on IV fluids, as well as evidence of aortic thrombus, started on heparin GTT. Subjective:   Cathy Jones today has, No headache, No chest pain, Complaints of abdominal pain, reports it is improving, reports no further nausea or vomiting since admission . Assessment & Plan    Principal Problem:   Acute renal failure (ARF) (HCC) Active Problems:   Hypertension   Hyperlipidemia   Depression   Hyponatremia   Hyperkalemia   S/P ileostomy (HCC)   S/P partial resection of colon   Splenic infarct   Abdominal aorta thrombosis (HCC)  Acute renal failure  - This is most likely prerenal candidate to poor appetite and oral intake over last week, and significant nausea and vomiting , and high colostomy output . - Significant improvement on IV fluids , baseline creatinine 0.89, on admission 5.8, today is 3.1 . - Avoid nephrotoxic medication, monitor closely as patient received IV contrast . - Continue to hold on losartan  Abdominal aorta thrombosis with splenic infarct  - This is most likely the source of her abdominal pain . - Continue with  anticoagulation with heparin GTT, she can be transitioned to Eliquis 100 nothing by mouth and renal function normalized. Cathy Jones from surgery discussed with Cathy Jones, he will need to follow as an outpatient with vascular surgery.  Abdominal pain ,Nausea and vomiting - Abdominal pain most likely related to abdominal aorta thrombosis, she reports her abdominal pain is improving - Unclear reason for nausea and vomiting, possibly viral illness, but appears to be improving, will start her on clear liquid diet and advance as tolerated i.  Status post colectomy and ileostomy - Management per surgery  Hyperkalemia - Resolved with hydration  Leukocytosis - Most likely stress related from dehydration and vomiting, resolved, afebrile, no evidence of infection, no indication for antibiotics  Hypertension - Blood pressure remains on the lower side, continue to hold all medication  HyperLipidemia - Resume home medication  Depression - Resume home medication  Code Status: full  Family Communication: None at bedside  Disposition Plan: Home when stable   Procedures  None   Consults   Gen. surgery   Medications  Scheduled Meds: . loperamide  4 mg Oral TID  . pantoprazole (PROTONIX) IV  40 mg Intravenous QHS   Continuous Infusions: . sodium chloride 150 mL/hr at 07/31/15 1023  . heparin 1,200 Units/hr (07/31/15 0032)   PRN Meds:.HYDROmorphone (DILAUDID) injection, LORazepam, ondansetron (ZOFRAN) IV  DVT Prophylaxis   Heparin GTT  Lab Results  Component Value Date   PLT 251 07/31/2015    Antibiotics    Anti-infectives    None          Objective:   Filed Vitals:   07/31/15 0930 07/31/15 1227 07/31/15 1425 07/31/15 1445  BP: 103/28 121/98 91/36 97/52   Pulse: 77 81 73 64  Temp:  98.1 F (36.7 C) 98.4 F (36.9 C) 98.7 F (37.1 C)  TempSrc:  Oral Oral   Resp: 15 18 23 18   Height:    5\' 2"  (1.575 m)  Weight:    85.276 kg (188 lb)  SpO2: 100% 100% 100%  100%    Wt Readings from Last 3 Encounters:  07/31/15 85.276 kg (188 lb)  06/27/15 97.297 kg (214 lb 8 oz)  06/21/15 97.297 kg (214 lb 8 oz)    No intake or output data in the 24 hours ending 07/31/15 1502   Physical Exam  Awake Alert, Oriented X 3, No new F.N deficits, Normal affect Cathy Jones.AT,PERRAL Supple Neck,No JVD, No cervical lymphadenopathy appriciated.  Symmetrical Chest wall movement, Good air movement bilaterally, CTAB RRR,No Gallops,Rubs or new Murmurs, No Parasternal Heave +ve B.Sounds, Abd Soft, Mild diffuse tenderness, ileostomy +. No Cyanosis, Clubbing or edema, No new Rash or bruise     Data Review   Micro Results No results found for this or any previous visit (from the past 240 hour(s)).  Radiology Reports Ct Abdomen Pelvis W Contrast  07/30/2015  CLINICAL DATA:  55 year old female with nausea and vomiting for 1 week. Unintentional 50 lbs. weight loss. Mid abdominal pain. History of History of cervical cancer status post hysterectomy. More recent history of short-segment sigmoid colon stricture treated surgically. EXAM: CT ABDOMEN AND PELVIS WITH CONTRAST TECHNIQUE: Multidetector CT imaging of the abdomen and pelvis was performed using the standard protocol following bolus administration of intravenous contrast. CONTRAST:  157mL ISOVUE-300 IOPAMIDOL (ISOVUE-300) INJECTION 61% COMPARISON:  CT virtual colonoscopy 07/19/2014. CT Abdomen and Pelvis 06/13/2014. FINDINGS: Lung bases are stable since 2016 and negative. No pericardial or pleural effusion. Stable mild elevation of the left hemidiaphragm. Stable visualized osseous structures. L3 limbus vertebra. Chronic multilevel vertebral endplate irregularity most pronounced at L4. No acute or suspicious osseous lesion identified. Since March 2016 the patient has undergone ventral abdominal surgery with right abdominal ileostomy. There is new confluent presacral stranding. Chronic retroperitoneal and pelvic sidewall dissection  with numerous surgical clips otherwise appears stable. Evidence of a sigmoid colectomy with staple line at what appears to be a sigmoid to rectum anastomosis (series 3, image 69). Along the right pelvic sidewall lateral to the apparent anastomosis there is an 18 x 42 mm fluid collection or less likely residual fluid-filled bowel loop (series 3, image 68). The uterus and adnexa are chronically surgically absent. There is a small volume of gas within the vagina. The urinary bladder is completely decompressed today. The left colon is decompressed. The transverse colon is decompressed. The right colon is decompressed. The distal small bowel is decompressed. The ileostomy has no adverse features there. No dilated small bowel loops. Is mild fluid distension of the stomach no pneumoperitoneum or abdominal free fluid. Liver, gallbladder, pancreas and adrenal glands are within normal limits. Portal venous system is patent. The spleen is remarkable for a mildly lobulated but wedge-shaped and peripheral hypodense area along the posterior margin which may be new since 2016. No surrounding inflammation. More subtle peripheral wedge-shaped hypodense area also on image 19 along the anterior contour, and image 12 at the superior pole  of the spleen. No left upper quadrant fluid. Extensive Aortoiliac calcified atherosclerosis noted. Major arterial structures appear to remain patent. However, there is newly seen low-density bulky mural plaque or thrombus along the posterior wall of the descending thoracic aorta measuring 6 x 11 x 10 mm (AP by transverse by CC), new since 2016. Stable residual small retroperitoneal lymph nodes. Bilateral renal enhancement is symmetric and within normal limits. However, despite up to 7 minute delayed imaging through the kidneys no renal contrast excretion occurred. However, at the same time there is no hydronephrosis or hydroureter. Both proximal ureters are diminutive. Both track into the postoperative  retroperitoneal changes an are poorly delineated distally. There is no perinephric stranding. The interval postoperative changes to the ventral abdominal wall are noted with no adverse features. IMPRESSION: 1. Evidence of multiple small splenic infarcts. Associated new soft mural plaque or thrombus in the descending thoracic aorta (series 3, image 3) suspicious for embolic source. Recommend vascular surgery consultation. 2. Interval postoperative changes to the pelvis, including apparent partial sigmoidectomy and reanastomosis. There is an elongated extraluminal fluid collection beginning posterior to the anastomosis site (in the midline presacral space) and tracking along the posterior right pelvic sidewall. This is of unclear etiology and significance. 3. Suspect acute renal insufficiency on the basis of intrinsic renal disease suspected in light of no contrast excretion to from the kidneys up to 7 minutes following injection. No evidence of obstructive uropathy. 4. New right abdominal ileostomy with no bowel obstruction. The above was discussed by telephone with Dr. Leighton Jones on [id number] at 15:14 . Electronically Signed   By: Genevie Ann M.D.   On: 07/30/2015 15:15   US Renal  07/31/2015  CLINICAL DATA:  Acute renal failure.  Hypertension. EXAM: RENAL / URINARY TRACT ULTRASOUND COMPLETE COMPARISON:  None. FINDINGS: Right Kidney: Length: 10.1 cm. Echogenicity within normal limits. No mass or hydronephrosis visualized. Left Kidney: Length: 11.1 cm. Echogenicity within normal limits. No mass or hydronephrosis visualized. Bladder: Appears normal for degree of bladder distention. IMPRESSION: Normal renal ultrasound.  No hydronephrosis. Electronically Signed   By: Suzy Bouchard M.D.   On: 07/31/2015 08:43     CBC  Recent Labs Lab 07/30/15 2111 07/31/15 0426  WBC 18.6* 11.8*  HGB 12.3 11.0*  HCT 37.5 33.5*  PLT 349 251  MCV 81.3 81.5  MCH 26.7 26.8  MCHC 32.8 32.8  RDW 14.3 14.3  LYMPHSABS 1.9  2.2  MONOABS 0.9 0.8  EOSABS 0.0 0.0  BASOSABS 0.0 0.0    Chemistries   Recent Labs Lab 07/30/15 2111 07/31/15 0856  NA 130* 136  K 6.0* 4.9  CL 96* 104  CO2 12* 17*  GLUCOSE 157* 104*  BUN 100* 89*  CREATININE 5.82* 3.12*  CALCIUM 10.0 9.3  AST 9*  --   ALT 9*  --   ALKPHOS 178*  --   BILITOT 0.6  --    ------------------------------------------------------------------------------------------------------------------ estimated creatinine clearance is 21.1 mL/min (by C-G formula based on Cr of 3.12). ------------------------------------------------------------------------------------------------------------------ No results for input(s): HGBA1C in the last 72 hours. ------------------------------------------------------------------------------------------------------------------ No results for input(s): CHOL, HDL, LDLCALC, TRIG, CHOLHDL, LDLDIRECT in the last 72 hours. ------------------------------------------------------------------------------------------------------------------ No results for input(s): TSH, T4TOTAL, T3FREE, THYROIDAB in the last 72 hours.  Invalid input(s): FREET3 ------------------------------------------------------------------------------------------------------------------ No results for input(s): VITAMINB12, FOLATE, FERRITIN, TIBC, IRON, RETICCTPCT in the last 72 hours.  Coagulation profile  Recent Labs Lab 07/31/15 0026  INR 1.20    No results for input(s): DDIMER in the last  72 hours.  Cardiac Enzymes No results for input(s): CKMB, TROPONINI, MYOGLOBIN in the last 168 hours.  Invalid input(s): CK ------------------------------------------------------------------------------------------------------------------ Invalid input(s): POCBNP     Time Spent in minutes   35 minutes   Abygayle Deltoro M.D on 07/31/2015 at 3:02 PM  Between 7am to 7pm - Pager - [phone]  After 7pm go to [email or web address] - password [facility]  [facility]   Office  [phone]

## 2015-07-31 NOTE — ED Notes (Signed)
Patient is aware of need for urine sample, states she is still unable to void at this time.

## 2015-07-31 NOTE — ED Notes (Signed)
Spoke to admitting Dr. And patient can go to tele bed instead of step down.  MD said patient can have clear liquids

## 2015-07-31 NOTE — ED Notes (Signed)
Nurse will draw blood 

## 2015-07-31 NOTE — Progress Notes (Signed)
ANTICOAGULATION CONSULT NOTE - Initial Consult  Pharmacy Consult for Heparin Indication: Abdominal aorta thrombosis, Splenic infarct  Allergies  Allergen Reactions  . Codeine Other (See Comments)    "hyper"    Patient Measurements: Height: 5\' 2"  (157.5 cm) Weight: 188 lb (85.276 kg) IBW/kg (Calculated) : 50.1 Heparin Dosing Weight: 69 kg  Vital Signs: Temp: 97.8 F (36.6 C) (03/21 0759) Temp Source: Oral (03/21 0759) BP: 103/28 mmHg (03/21 0930) Pulse Rate: 77 (03/21 0930)  Labs:  Recent Labs  07/30/15 2111 07/31/15 0026 07/31/15 0426 07/31/15 0856  HGB 12.3  --  11.0*  --   HCT 37.5  --  33.5*  --   PLT 349  --  251  --   APTT  --  28  --   --   LABPROT  --  15.4*  --   --   INR  --  1.20  --   --   HEPARINUNFRC  --   --   --  0.34  CREATININE 5.82*  --   --  3.12*    Estimated Creatinine Clearance: 21.1 mL/min (by C-G formula based on Cr of 3.12).   Medical History: Past Medical History  Diagnosis Date  . Hypertension   . Hyperlipidemia   . Cervical cancer ([place])   . Asthma     pt brought her proair inhaler 06-06-14  . Colon polyp     hyperplastic  . 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 Colonic obstruction ([place])   . Family history of adverse reaction to anesthesia     pts mother experiences nausea and vomiting   . Shortness of breath dyspnea     seasonal   . History of bronchitis   . Depression   . Headache     migraines  . History of hiatal hernia   . Ear infection     left   . Sinus infection     Medications:   (Not in a hospital admission) Infusions:  . sodium chloride 150 mL/hr at 07/31/15 1023  . heparin 1,200 Units/hr (07/31/15 0032)    Assessment: 10 yoF s/p colon resection/diverging ileostomy on 06/27/15 for colon stricture/obstruction presents with abdominal pains and AKI.  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  Starting heparin infusion.  Med history indicates that patient has a new  prescription for apixaban but was not started.  Today, 07/31/2015: - First heparin level therapeutic (0.34) with infusion at 1200 units/hr. - SCr improved to 3.12, high output ileostomy + received IV contrast - CBC ok. No bleeding/complications reported.   Goal of Therapy:  Heparin level 0.3-0.7 units/ml Monitor platelets by anticoagulation protocol: Yes   Plan:  Continue heparin infusion at 1200 units/hr. Repeat heparin level in 8 hours. Daily CBC and HL while on heparin infusion.    Hershal Coria 07/31/2015,10:50 AM

## 2015-07-31 NOTE — Progress Notes (Signed)
ANTICOAGULATION CONSULT NOTE - Follow Up Consult  Pharmacy Consult for Heparin Indication: Abdominal aorta thrombosis, Splenic infarct  Allergies  Allergen Reactions  . Codeine Other (See Comments)    "hyper"    Patient Measurements: Height: 5\' 2"  (157.5 cm) Weight: 188 lb (85.276 kg) IBW/kg (Calculated) : 50.1 Heparin Dosing Weight: 69 kg  Vital Signs: Temp: 98.7 F (37.1 C) (03/21 1445) Temp Source: Oral (03/21 1425) BP: 97/52 mmHg (03/21 1445) Pulse Rate: 64 (03/21 1445)  Labs:  Recent Labs  07/30/15 2111 07/31/15 0026 07/31/15 0426 07/31/15 0856 07/31/15 1708  HGB 12.3  --  11.0*  --   --   HCT 37.5  --  33.5*  --   --   PLT 349  --  251  --   --   APTT  --  28  --   --   --   LABPROT  --  15.4*  --   --   --   INR  --  1.20  --   --   --   HEPARINUNFRC  --   --   --  0.34 0.25*  CREATININE 5.82*  --   --  3.12*  --     Estimated Creatinine Clearance: 21.1 mL/min (by C-G formula based on Cr of 3.12).   Medical History: Past Medical History  Diagnosis Date  . Hypertension   . Hyperlipidemia   . Cervical cancer ([place])   . Asthma     pt brought her proair inhaler 06-06-14  . Colon polyp     hyperplastic  . 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 Colonic obstruction ([place])   . Family history of adverse reaction to anesthesia     pts mother experiences nausea and vomiting   . Shortness of breath dyspnea     seasonal   . History of bronchitis   . Depression   . Headache     migraines  . History of hiatal hernia   . Ear infection     left   . Sinus infection     Medications:  Prescriptions prior to admission  Medication Sig Dispense Refill Last Dose  . amLODipine-olmesartan (AZOR) 5-20 MG per tablet Take 1 tablet by mouth every morning.    07/29/2015 at Unknown time  . atorvastatin (LIPITOR) 10 MG tablet Take 10 mg by mouth every morning.   07/29/2015 at Unknown time  . LORazepam (ATIVAN) 0.5 MG tablet Take 0.5 mg by mouth every morning.   07/29/2015 at Unknown  time  . PROAIR HFA 108 (90 BASE) MCG/ACT inhaler Inhale 1-2 puffs into the lungs every 4 (four) hours as needed for wheezing or shortness of breath.    2016  . sertraline (ZOLOFT) 100 MG tablet Take one tablet by mouth every morning.  0 07/29/2015 at Unknown time  . ELIQUIS 5 MG TABS tablet Take 5 mg by mouth as directed.  1   . oxyCODONE (OXY IR/ROXICODONE) 5 MG immediate release tablet Take 1-2 tablets (5-10 mg total) by mouth every 4 (four) hours as needed for moderate pain. (Patient not taking: Reported on 07/30/2015) 30 tablet 0    Infusions:  . sodium chloride 150 mL/hr at 07/31/15 1023  . heparin      Assessment: 74 yoF s/p colon resection/diverging ileostomy on 06/27/15 for colon stricture/obstruction presents with abdominal pains and AKI.  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  Starting heparin infusion.  Med history indicates that patient has a  new prescription for apixaban but was not started.  Today, 07/31/2015: - First heparin level therapeutic (0.34) with infusion at 1200 units/hr. - Repeat heparin level = 0.25 (SUBtherapeutic) with infusion at 1200 units/hr - No bleeding/complications reported.   Goal of Therapy:  Heparin level 0.3-0.7 units/ml Monitor platelets by anticoagulation protocol: Yes   Plan:  Increase heparin infusion at 1400 units/hr. Repeat heparin level in 8 hours. Daily CBC and HL while on heparin infusion.    Alphus Zeck, Toribio Harbour, PharmD 07/31/2015,6:10 PM

## 2015-07-31 NOTE — Progress Notes (Signed)
Acute renal failure (ARF) (HCC)  Subjective: Pt feeling somewhat better this am.  Objective: Vital signs in last 24 hours: Temp:  [97.8 F (36.6 C)] 97.8 F (36.6 C) (03/21 0759) Pulse Rate:  [73-111] 93 (03/21 0759) Resp:  [11-24] 21 (03/21 0759) BP: (90-139)/(37-89) 127/37 mmHg (03/21 0759) SpO2:  [92 %-100 %] 100 % (03/21 0759) Weight:  [85.276 kg (188 lb)] 85.276 kg (188 lb) (03/20 2055)    Intake/Output from previous day:   Intake/Output this shift:    General appearance: alert and cooperative GI: soft  Lab Results:  Results for orders placed or performed during the hospital encounter of 07/30/15 (from the past 24 hour(s))  CBC with Differential/Platelet     Status: Abnormal   Collection Time: 07/30/15  9:11 PM  Result Value Ref Range   WBC 18.6 (H) 4.0 - 10.5 K/uL   RBC 4.61 3.87 - 5.11 MIL/uL   Hemoglobin 12.3 12.0 - 15.0 g/dL   HCT 37.5 36.0 - 46.0 %   MCV 81.3 78.0 - 100.0 fL   MCH 26.7 26.0 - 34.0 pg   MCHC 32.8 30.0 - 36.0 g/dL   RDW 14.3 11.5 - 15.5 %   Platelets 349 150 - 400 K/uL   Neutrophils Relative % 85 %   Neutro Abs 15.8 (H) 1.7 - 7.7 K/uL   Lymphocytes Relative 10 %   Lymphs Abs 1.9 0.7 - 4.0 K/uL   Monocytes Relative 5 %   Monocytes Absolute 0.9 0.1 - 1.0 K/uL   Eosinophils Relative 0 %   Eosinophils Absolute 0.0 0.0 - 0.7 K/uL   Basophils Relative 0 %   Basophils Absolute 0.0 0.0 - 0.1 K/uL  Comprehensive metabolic panel     Status: Abnormal   Collection Time: 07/30/15  9:11 PM  Result Value Ref Range   Sodium 130 (L) 135 - 145 mmol/L   Potassium 6.0 (H) 3.5 - 5.1 mmol/L   Chloride 96 (L) 101 - 111 mmol/L   CO2 12 (L) 22 - 32 mmol/L   Glucose, Bld 157 (H) 65 - 99 mg/dL   BUN 100 (H) 6 - 20 mg/dL   Creatinine, Ser 5.82 (H) 0.44 - 1.00 mg/dL   Calcium 10.0 8.9 - 10.3 mg/dL   Total Protein 8.8 (H) 6.5 - 8.1 g/dL   Albumin 4.8 3.5 - 5.0 g/dL   AST 9 (L) 15 - 41 U/L   ALT 9 (L) 14 - 54 U/L   Alkaline Phosphatase 178 (H) 38 - 126 U/L    Total Bilirubin 0.6 0.3 - 1.2 mg/dL   GFR calc non Af Amer 8 (L) >60 mL/min   GFR calc Af Amer 9 (L) >60 mL/min   Anion gap 22 (H) 5 - 15  APTT     Status: None   Collection Time: 07/31/15 12:26 AM  Result Value Ref Range   aPTT 28 24 - 37 seconds  Protime-INR     Status: Abnormal   Collection Time: 07/31/15 12:26 AM  Result Value Ref Range   Prothrombin Time 15.4 (H) 11.6 - 15.2 seconds   INR 1.20 0.00 - 1.49  CBC with Differential/Platelet     Status: Abnormal   Collection Time: 07/31/15  4:26 AM  Result Value Ref Range   WBC 11.8 (H) 4.0 - 10.5 K/uL   RBC 4.11 3.87 - 5.11 MIL/uL   Hemoglobin 11.0 (L) 12.0 - 15.0 g/dL   HCT 33.5 (L) 36.0 - 46.0 %   MCV 81.5 78.0 -  100.0 fL   MCH 26.8 26.0 - 34.0 pg   MCHC 32.8 30.0 - 36.0 g/dL   RDW 14.3 11.5 - 15.5 %   Platelets 251 150 - 400 K/uL   Neutrophils Relative % 74 %   Neutro Abs 8.8 (H) 1.7 - 7.7 K/uL   Lymphocytes Relative 19 %   Lymphs Abs 2.2 0.7 - 4.0 K/uL   Monocytes Relative 7 %   Monocytes Absolute 0.8 0.1 - 1.0 K/uL   Eosinophils Relative 0 %   Eosinophils Absolute 0.0 0.0 - 0.7 K/uL   Basophils Relative 0 %   Basophils Absolute 0.0 0.0 - 0.1 K/uL  Phosphorus, urine, random *Canceled*     Status: None ()   Collection Time: 07/31/15  8:01 AM   Narrative   LIS Cancel (ORR/DE = Data Error)     Studies/Results Radiology     MEDS, Scheduled . loperamide  4 mg Oral TID  . pantoprazole (PROTONIX) IV  40 mg Intravenous Q24H  . sodium polystyrene  30 g Oral Once     Assessment: Acute renal failure (ARF) ([place]) May be due to high output from ileostomy.  It appears she was emptying her ostomy more than she told me yesterday and had stopped her imodium at home.  Unfortunately she got IV contrast before we knew her renal function, due to some lost labwork, and this may worsen her renal function further. This was discussed with the patient and she seemed to understand. Hopefully, this is all still reversible.       Plan: I will start scheduled imodium to help limit her ostomy output.  Recommend strict I&O's to follow ostomy output.  Will follow with you.  Anticoagulation started for aortic clot.  This was discussed with Dr Donnetta Hutching yesterday.  We will have her f/u with them as an outpatient.    She does have a small fluid collection in her pelvis.  This may be a contained leak.  She is diverted, so I think it is ok to watch this for now.      LOS: 1 day    Rosario Adie, MD [facility], [place]   07/31/2015 8:20 AM

## 2015-07-31 NOTE — Progress Notes (Signed)
ANTICOAGULATION CONSULT NOTE - Initial Consult  Pharmacy Consult for Heparin Indication: Abdominal aorta thrombosis, Splenic infarct  Allergies  Allergen Reactions  . Codeine Other (See Comments)    "hyper"    Patient Measurements: Height: 5\' 2"  (157.5 cm) Weight: 188 lb (85.276 kg) IBW/kg (Calculated) : 50.1 Heparin Dosing Weight:   Vital Signs: Temp: 97.8 F (36.6 C) (03/20 2055) Temp Source: Oral (03/20 2055) BP: 103/42 mmHg (03/21 0530) Pulse Rate: 79 (03/21 0530)  Labs:  Recent Labs  07/30/15 2111 07/31/15 0026 07/31/15 0426  HGB 12.3  --  11.0*  HCT 37.5  --  33.5*  PLT 349  --  251  APTT  --  28  --   LABPROT  --  15.4*  --   INR  --  1.20  --   CREATININE 5.82*  --   --     Estimated Creatinine Clearance: 11.3 mL/min (by C-G formula based on Cr of 5.82).   Medical History: Past Medical History  Diagnosis Date  . Hypertension   . Hyperlipidemia   . Cervical cancer ([place])   . Asthma     pt brought her proair inhaler 06-06-14  . Colon polyp     hyperplastic  . 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 Colonic obstruction ([place])   . Family history of adverse reaction to anesthesia     pts mother experiences nausea and vomiting   . Shortness of breath dyspnea     seasonal   . History of bronchitis   . Depression   . Headache     migraines  . History of hiatal hernia   . Ear infection     left   . Sinus infection     Medications:   (Not in a hospital admission) Infusions:  . sodium chloride 150 mL/hr at 07/31/15 0229  . heparin 1,200 Units/hr (07/31/15 0032)    Assessment: Patient with ARF in ED with abdominal pain.  Found to have Abdominal aorta thrombosis, Splenic infarct.  Patient had not started oral apixaban, per med rec.  Goal of Therapy:  Heparin level 0.3-0.7 units/ml Monitor platelets by anticoagulation protocol: Yes   Plan:  Heparin bolus 2000 units iv x1 Heparin drip at 1200 units/hr Daily CBC Next heparin level at 0900    Tyler Deis, Shea Stakes [place] 07/31/2015,6:05 AM

## 2015-08-01 DIAGNOSIS — Z932 Ileostomy status: Secondary | ICD-10-CM

## 2015-08-01 DIAGNOSIS — I9589 Other hypotension: Secondary | ICD-10-CM

## 2015-08-01 DIAGNOSIS — I7409 Other arterial embolism and thrombosis of abdominal aorta: Secondary | ICD-10-CM

## 2015-08-01 DIAGNOSIS — D735 Infarction of spleen: Secondary | ICD-10-CM

## 2015-08-01 DIAGNOSIS — Z9049 Acquired absence of other specified parts of digestive tract: Secondary | ICD-10-CM

## 2015-08-01 DIAGNOSIS — D7389 Other diseases of spleen: Secondary | ICD-10-CM

## 2015-08-01 DIAGNOSIS — N179 Acute kidney failure, unspecified: Principal | ICD-10-CM

## 2015-08-01 LAB — CBC
HCT: 30.9 % — ABNORMAL LOW (ref 36.0–46.0)
HEMOGLOBIN: 10 g/dL — AB (ref 12.0–15.0)
MCH: 27 pg (ref 26.0–34.0)
MCHC: 32.4 g/dL (ref 30.0–36.0)
MCV: 83.5 fL (ref 78.0–100.0)
PLATELETS: 221 10*3/uL (ref 150–400)
RBC: 3.7 MIL/uL — AB (ref 3.87–5.11)
RDW: 14.4 % (ref 11.5–15.5)
WBC: 8 10*3/uL (ref 4.0–10.5)

## 2015-08-01 LAB — HEPARIN LEVEL (UNFRACTIONATED)
HEPARIN UNFRACTIONATED: 0.46 [IU]/mL (ref 0.30–0.70)
HEPARIN UNFRACTIONATED: 0.4 [IU]/mL (ref 0.30–0.70)

## 2015-08-01 LAB — BASIC METABOLIC PANEL
ANION GAP: 7 (ref 5–15)
BUN: 53 mg/dL — AB (ref 6–20)
CHLORIDE: 112 mmol/L — AB (ref 101–111)
CO2: 19 mmol/L — ABNORMAL LOW (ref 22–32)
Calcium: 8.8 mg/dL — ABNORMAL LOW (ref 8.9–10.3)
Creatinine, Ser: 1.32 mg/dL — ABNORMAL HIGH (ref 0.44–1.00)
GFR calc Af Amer: 52 mL/min — ABNORMAL LOW (ref >60)
GFR, EST NON AFRICAN AMERICAN: 45 mL/min — AB (ref >60)
Glucose, Bld: 105 mg/dL — ABNORMAL HIGH (ref 65–99)
POTASSIUM: 4.8 mmol/L (ref 3.5–5.1)
SODIUM: 138 mmol/L (ref 135–145)

## 2015-08-01 LAB — MICROALBUMIN / CREATININE URINE RATIO
Creatinine, Urine: 88.6 mg/dL
MICROALB/CREAT RATIO: 30.2 mg/g{creat} — AB (ref 0.0–30.0)
Microalb, Ur: 26.8 ug/mL — ABNORMAL HIGH

## 2015-08-01 MED ORDER — LOPERAMIDE HCL 2 MG PO CAPS: 4.0000 mg | ORAL_CAPSULE | Freq: Three times a day (TID) | ORAL | Status: DC

## 2015-08-01 MED ORDER — HEPARIN (PORCINE) IN NACL 100-0.45 UNIT/ML-% IJ SOLN: 1400.0000 [IU]/h | INTRAMUSCULAR | Status: AC

## 2015-08-01 MED ORDER — NYSTATIN 100000 UNIT/GM EX POWD
Freq: Three times a day (TID) | CUTANEOUS | Status: DC
  Administered 2015-08-01 – 2015-08-04 (×7): via TOPICAL
  Filled 2015-08-01: qty 15

## 2015-08-01 MED ORDER — APIXABAN 5 MG PO TABS
10.0000 mg | ORAL_TABLET | Freq: Two times a day (BID) | ORAL | Status: DC
  Administered 2015-08-01: 10 mg via ORAL
  Filled 2015-08-01: qty 2

## 2015-08-01 MED ORDER — LOPERAMIDE HCL 2 MG PO CAPS
4.0000 mg | ORAL_CAPSULE | Freq: Three times a day (TID) | ORAL | Status: DC
  Administered 2015-08-01 – 2015-08-04 (×11): 4 mg via ORAL
  Filled 2015-08-01 (×11): qty 2

## 2015-08-01 MED ORDER — APIXABAN 5 MG PO TABS: 5.0000 mg | ORAL_TABLET | Freq: Two times a day (BID) | ORAL | Status: DC

## 2015-08-01 NOTE — Progress Notes (Signed)
[facility] Progress Note   Cathy Jones  [id number]  DOB: [birth year]  DOA: 07/30/2015 PCP: Tula Nakayama  Brief narrative: Cathy Jones is a 55 y.o. female with a history hypertension, of cervical cancer, colon stricture status post partial colon resection and diverting ileostomy on 2/15 came to the ER when referred by Dr. Leighton Ruff for abnormal lab work. She had acute renal failure with a unit 100 and creatinine of 5.82. 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 The patient stated that she had increased output from her ileostomy and also admitted to not taking her Imodium at home recently. Apparently she was vomiting for a number of days as well.   Subjective: No complaint of pain nausea or vomiting on clear liquids. He feels that ileostomy output has improved. Urinating well.  Assessment/Plan: Principal Problem:   Acute renal failure/metabolic acidosis/hyperkalemia and hyponatremia on admission -Due to vomiting, high output ileostomy in setting of olmesartan use-improved with IV fluids and resumption of Imodium -Continue IV fluids for now as renal function not back to baseline- baseline creatinine 0.89 -She did receive contrast for the CT on the morning prior to admission but this would not have caused renal failure so quickly -Renal ultrasound negative  Active Problems:  Leukocytosis on admission -Stress response?-Related to vomiting?-Has resolved  Vomiting -Has resolved-transition to solid food  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 Chuck Hint with Dr. Donnetta Hutching about this and we'll have the patient follow-up with him    S/P ileostomy - partial resection of  colon -High output ileostomy-supposed to be on Imodium as outpatient - per Dr Manon Hilding note , She will need to measure her output at home and call the office if this is >1L. -Small fluid collection in her pelvis-Surgery following an suspects it may be a "contained leak" and would like to monitor it for now without intervention  Hypotensive a history of hypertension -Hold amlodipine and almost [place]  Anxiety -On Ativan and Zoloft in the a.m.    Antibiotics: Anti-infectives    None     Code Status:     Code Status Orders        Start     Ordered   07/31/15 0204  Full code   Continuous     07/31/15 0203    Code Status History    Date Active Date Inactive Code Status Order ID Comments User Context   This patient has a current code status but no historical code status.     Family Communication:  Disposition Plan: Home in 1-2 days DVT prophylaxis: Heparin Consultants: Gen. Surgery Procedures:     Objective: [facility] Weights   07/30/15 2055 07/31/15 1445  Weight: 85.276 kg (188 lb) 85.276 kg (188 lb)    Intake/Output Summary (Last 24 hours) at 08/01/15 1456 Last data filed at 08/01/15 1434  Gross per 24 hour  Intake   2634 ml  Output    754 ml  Net   1880 ml     Vitals Filed Vitals:   07/31/15 1445 07/31/15 2306 08/01/15 0633 08/01/15 1421  BP: 97/52 97/74 103/41 84/25  Pulse: 64 76 64 70  Temp: 98.7 F (37.1 C) 98.1 F (36.7 C) 98 F (36.7 C) 98.5  F (36.9 C)  TempSrc:  Oral Oral Oral  Resp: 18 17 16 16   Height: 5\' 2"  (1.575 m)     Weight: 85.276 kg (188 lb)     SpO2: 100% 100% 99% 96%    Exam:  General:  Pt is alert, not in acute distress  HEENT: No icterus, No thrush, oral mucosa moist  Cardiovascular: regular rate and rhythm, S1/S2 No murmur  Respiratory: clear to auscultation bilaterally   Abdomen: Soft, +Bowel sounds, non tender, non distended, no guarding- ileostomy with liquid stool  MSK: No cyanosis or clubbing- no pedal  edema   Data Reviewed: Basic Metabolic Panel:  Recent Labs Lab 07/30/15 2111 07/31/15 0856 08/01/15 0221  NA 130* 136 138  K 6.0* 4.9 4.8  CL 96* 104 112*  CO2 12* 17* 19*  GLUCOSE 157* 104* 105*  BUN 100* 89* 53*  CREATININE 5.82* 3.12* 1.32*  CALCIUM 10.0 9.3 8.8*   Liver Function Tests:  Recent Labs Lab 07/30/15 2111  AST 9*  ALT 9*  ALKPHOS 178*  BILITOT 0.6  PROT 8.8*  ALBUMIN 4.8   No results for input(s): LIPASE, AMYLASE in the last 168 hours. No results for input(s): AMMONIA in the last 168 hours. CBC:  Recent Labs Lab 07/30/15 2111 07/31/15 0426 08/01/15 0221  WBC 18.6* 11.8* 8.0  NEUTROABS 15.8* 8.8*  --   HGB 12.3 11.0* 10.0*  HCT 37.5 33.5* 30.9*  MCV 81.3 81.5 83.5  PLT 349 251 221   Cardiac Enzymes: No results for input(s): CKTOTAL, CKMB, CKMBINDEX, TROPONINI in the last 168 hours. BNP (last 3 results) No results for input(s): BNP in the last 8760 hours.  ProBNP (last 3 results) No results for input(s): PROBNP in the last 8760 hours.  CBG: No results for input(s): GLUCAP in the last 168 hours.  No results found for this or any previous visit (from the past 240 hour(s)).   Studies: US Renal  07/31/2015  CLINICAL DATA:  Acute renal failure.  Hypertension. EXAM: RENAL / URINARY TRACT ULTRASOUND COMPLETE COMPARISON:  None. FINDINGS: Right Kidney: Length: 10.1 cm. Echogenicity within normal limits. No mass or hydronephrosis visualized. Left Kidney: Length: 11.1 cm. Echogenicity within normal limits. No mass or hydronephrosis visualized. Bladder: Appears normal for degree of bladder distention. IMPRESSION: Normal renal ultrasound.  No hydronephrosis. Electronically Signed   By: Suzy Bouchard M.D.   On: 07/31/2015 08:43    Scheduled Meds:  Scheduled Meds: . atorvastatin  10 mg Oral QPC supper  . loperamide  4 mg Oral TID AC & HS  . LORazepam  0.5 mg Oral q morning - 10a  . pantoprazole (PROTONIX) IV  40 mg Intravenous QHS  .  sertraline  100 mg Oral q morning - 10a   Continuous Infusions: . sodium chloride 100 mL/hr at 08/01/15 1434  . heparin 1,400 Units/hr (08/01/15 1103)    Time spent on care of this patient: 35 min   [place], MD 08/01/2015, 2:56 PM  LOS: 2 days   [facility] Office  [phone] Pager - Text Page per [email or web address] If 7PM-7AM, please contact night-coverage [email or web address]

## 2015-08-01 NOTE — Progress Notes (Signed)
ANTICOAGULATION CONSULT NOTE - Follow Up Consult  Pharmacy Consult for Heparin Indication: Abdominal aorta thrombosis, Splenic infarct  Allergies  Allergen Reactions  . Codeine Other (See Comments)    "hyper"    Patient Measurements: Height: 5\' 2"  (157.5 cm) Weight: 188 lb (85.276 kg) IBW/kg (Calculated) : 50.1 Heparin Dosing Weight:   Vital Signs: Temp: 98.1 F (36.7 C) (03/21 2306) Temp Source: Oral (03/21 2306) BP: 97/74 mmHg (03/21 2306) Pulse Rate: 76 (03/21 2306)  Labs:  Recent Labs  07/30/15 2111 07/31/15 0026 07/31/15 0426 07/31/15 0856 07/31/15 1708 08/01/15 0221  HGB 12.3  --  11.0*  --   --  10.0*  HCT 37.5  --  33.5*  --   --  30.9*  PLT 349  --  251  --   --  221  APTT  --  28  --   --   --   --   LABPROT  --  15.4*  --   --   --   --   INR  --  1.20  --   --   --   --   HEPARINUNFRC  --   --   --  0.34 0.25* 0.46  CREATININE 5.82*  --   --  3.12*  --  1.32*    Estimated Creatinine Clearance: 50 mL/min (by C-G formula based on Cr of 1.32).   Medications:  Infusions:  . sodium chloride 150 mL/hr at 08/01/15 0021  . heparin 1,400 Units/hr (07/31/15 1814)    Assessment: Patient with heparin level at goal.  No heparin issues noted.  Goal of Therapy:  Heparin level 0.3-0.7 units/ml Monitor platelets by anticoagulation protocol: Yes   Plan:  Continue heparin drip at current rate Recheck level at [name], [name] 08/01/2015,5:03 AM

## 2015-08-01 NOTE — Progress Notes (Signed)
Acute renal failure (ARF) (HCC)  Subjective: Pt feeling better. Ostomy output not being recorded.  Objective: Vital signs in last 24 hours: Temp:  [98 F (36.7 C)-98.7 F (37.1 C)] 98 F (36.7 C) (03/22 [id number]) Pulse Rate:  [64-76] 64 (03/22 0633) Resp:  [16-23] 16 (03/22 0633) BP: (91-103)/(36-74) 103/41 mmHg (03/22 0633) SpO2:  [99 %-100 %] 99 % (03/22 [id number]) Weight:  [85.276 kg (188 lb)] 85.276 kg (188 lb) (03/21 1445)    Intake/Output from previous day: 03/21 0701 - 03/22 0700 In: 2514 [P.O.:600; I.V.:1914] Out: 4 [Urine:4] Intake/Output this shift:    General appearance: alert and cooperative GI: soft  Lab Results:  Results for orders placed or performed during the hospital encounter of 07/30/15 (from the past 24 hour(s))  Heparin level (unfractionated)     Status: Abnormal   Collection Time: 07/31/15  5:08 PM  Result Value Ref Range   Heparin Unfractionated 0.25 (L) 0.30 - 0.70 IU/mL  CBC     Status: Abnormal   Collection Time: 08/01/15  2:21 AM  Result Value Ref Range   WBC 8.0 4.0 - 10.5 K/uL   RBC 3.70 (L) 3.87 - 5.11 MIL/uL   Hemoglobin 10.0 (L) 12.0 - 15.0 g/dL   HCT 30.9 (L) 36.0 - 46.0 %   MCV 83.5 78.0 - 100.0 fL   MCH 27.0 26.0 - 34.0 pg   MCHC 32.4 30.0 - 36.0 g/dL   RDW 14.4 11.5 - 15.5 %   Platelets 221 150 - 400 K/uL  Basic metabolic panel     Status: Abnormal   Collection Time: 08/01/15  2:21 AM  Result Value Ref Range   Sodium 138 135 - 145 mmol/L   Potassium 4.8 3.5 - 5.1 mmol/L   Chloride 112 (H) 101 - 111 mmol/L   CO2 19 (L) 22 - 32 mmol/L   Glucose, Bld 105 (H) 65 - 99 mg/dL   BUN 53 (H) 6 - 20 mg/dL   Creatinine, Ser 1.32 (H) 0.44 - 1.00 mg/dL   Calcium 8.8 (L) 8.9 - 10.3 mg/dL   GFR calc non Af Amer 45 (L) >60 mL/min   GFR calc Af Amer 52 (L) >60 mL/min   Anion gap 7 5 - 15  Heparin level (unfractionated)     Status: None   Collection Time: 08/01/15  2:21 AM  Result Value Ref Range   Heparin Unfractionated 0.46 0.30 - 0.70 IU/mL   Heparin level (unfractionated)     Status: None   Collection Time: 08/01/15 11:16 AM  Result Value Ref Range   Heparin Unfractionated 0.40 0.30 - 0.70 IU/mL     Studies/Results Radiology     MEDS, Scheduled . atorvastatin  10 mg Oral QPC supper  . loperamide  4 mg Oral TID  . LORazepam  0.5 mg Oral q morning - 10a  . pantoprazole (PROTONIX) IV  40 mg Intravenous QHS  . sertraline  100 mg Oral q morning - 10a     Assessment: Acute renal failure (ARF) ([place]) May be due to high output from ileostomy.  It appears she was emptying her ostomy more than she told me yesterday and had stopped her imodium at home.  Unfortunately she got IV contrast before we knew her renal function, due to some lost labwork, and this may worsen her renal function further. This was discussed with the patient and she seemed to understand. Hopefully, this is all still reversible.      Plan: I will cont scheduled imodium  to help limit her ostomy output.  She will need to measure her output at home and call the office if this is >1L.  Recommend strict I&O's to follow ostomy output while in the hospital.  Will follow with you.  Anticoagulation started for aortic clot.  This was discussed with Dr Donnetta Hutching.  We will have her f/u with them as an outpatient.    She does have a small fluid collection in her pelvis.  This may be a contained leak.  She is diverted, so I think it is ok to watch this for now.      LOS: 2 days    Rosario Adie, [facility], [place]   08/01/2015 12:49 PM

## 2015-08-01 NOTE — Progress Notes (Signed)
[place] for Eliquis Indication: splenic infarcts, abdominal aorta thrombosis  Allergies  Allergen Reactions  . Codeine Other (See Comments)    "hyper"    Patient Measurements: Height: 5\' 2"  (157.5 cm) Weight: 193 lb 6.4 oz (87.726 kg) IBW/kg (Calculated) : 50.1 HEPARIN DW (KG): 69.4   Vital Signs: Temp: 98.5 F (36.9 C) (03/22 1421) Temp Source: Oral (03/22 1421) BP: 84/25 mmHg (03/22 1421) Pulse Rate: 70 (03/22 1421)  Labs:  Recent Labs  07/30/15 2111 07/31/15 0026 07/31/15 0426  07/31/15 0856 07/31/15 1708 08/01/15 0221 08/01/15 1116  HGB 12.3  --  11.0*  --   --   --  10.0*  --   HCT 37.5  --  33.5*  --   --   --  30.9*  --   PLT 349  --  251  --   --   --  221  --   APTT  --  28  --   --   --   --   --   --   LABPROT  --  15.4*  --   --   --   --   --   --   INR  --  1.20  --   --   --   --   --   --   HEPARINUNFRC  --   --   --   < > 0.34 0.25* 0.46 0.40  CREATININE 5.82*  --   --   --  3.12*  --  1.32*  --   < > = values in this interval not displayed. Estimated Creatinine Clearance: 50.7 mL/min (by C-G formula based on Cr of 1.32).  Medical History: Past Medical History  Diagnosis Date  . Hypertension   . Hyperlipidemia   . Cervical cancer ([place])   . Asthma     pt brought her proair inhaler 06-06-14  . Colon polyp     hyperplastic  . 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 Colonic obstruction ([place])   . Family history of adverse reaction to anesthesia     pts mother experiences nausea and vomiting   . Shortness of breath dyspnea     seasonal   . History of bronchitis   . Depression   . Headache     migraines  . History of hiatal hernia   . Ear infection     left   . Sinus infection     Medications:  Infusions:  . sodium chloride 100 mL/hr at 08/01/15 1434  . heparin 1,400 Units/hr (08/01/15 1103)    Assessment: 42 yoF s/p colon resection/diverging ileostomy on 06/27/15 for colon stricture/obstruction presents  with abdominal pains and AKI. 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 Med history indicates that patient has a new prescription for apixaban but was not started. Heparin drip started on 3/21 for thrombosis.  Pharmacy now asked to transition to Eliquis for discharge planning.  She does not require any dose adjustments based on her age, wt, and Scr.  No active bleeding reported.   Goal of Therapy:  Therapeutic anticoagulation   Plan:  Eliquis 10mg  po BID x 7 days followed by Eliquis 5mg  po BID. STOP Heparin when 1st dose of Eliquis given D/C Daily heparin labs Provide Eliquis education prior to discharge  Netta Cedars, PharmD, BCPS Pager: [phone] 08/01/2015,3:28 PM

## 2015-08-01 NOTE — Progress Notes (Signed)
ANTICOAGULATION CONSULT NOTE - Follow Up Consult  Pharmacy Consult for Heparin Indication: Abdominal aorta thrombosis, Splenic infarct  Allergies  Allergen Reactions  . Codeine Other (See Comments)    "hyper"    Patient Measurements: Height: 5\' 2"  (157.5 cm) Weight: 188 lb (85.276 kg) IBW/kg (Calculated) : 50.1 Heparin Dosing Weight: 69 kg  Vital Signs: Temp: 98 F (36.7 C) (03/22 0633) Temp Source: Oral (03/22 [id number]) BP: 103/41 mmHg (03/22 [id number]) Pulse Rate: 64 (03/22 0633)  Labs:  Recent Labs  07/30/15 2111 07/31/15 0026 07/31/15 0426  07/31/15 0856 07/31/15 1708 08/01/15 0221 08/01/15 1116  HGB 12.3  --  11.0*  --   --   --  10.0*  --   HCT 37.5  --  33.5*  --   --   --  30.9*  --   PLT 349  --  251  --   --   --  221  --   APTT  --  28  --   --   --   --   --   --   LABPROT  --  15.4*  --   --   --   --   --   --   INR  --  1.20  --   --   --   --   --   --   HEPARINUNFRC  --   --   --   < > 0.34 0.25* 0.46 0.40  CREATININE 5.82*  --   --   --  3.12*  --  1.32*  --   < > = values in this interval not displayed.  Estimated Creatinine Clearance: 50 mL/min (by C-G formula based on Cr of 1.32).   Medical History: Past Medical History  Diagnosis Date  . Hypertension   . Hyperlipidemia   . Cervical cancer ([place])   . Asthma     pt brought her proair inhaler 06-06-14  . Colon polyp     hyperplastic  . 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 Colonic obstruction ([place])   . Family history of adverse reaction to anesthesia     pts mother experiences nausea and vomiting   . Shortness of breath dyspnea     seasonal   . History of bronchitis   . Depression   . Headache     migraines  . History of hiatal hernia   . Ear infection     left   . Sinus infection     Medications:  Prescriptions prior to admission  Medication Sig Dispense Refill Last Dose  . amLODipine-olmesartan (AZOR) 5-20 MG per tablet Take 1 tablet by mouth every morning.    07/29/2015 at Unknown time  .  atorvastatin (LIPITOR) 10 MG tablet Take 10 mg by mouth every morning.   07/29/2015 at Unknown time  . LORazepam (ATIVAN) 0.5 MG tablet Take 0.5 mg by mouth every morning.   07/29/2015 at Unknown time  . PROAIR HFA 108 (90 BASE) MCG/ACT inhaler Inhale 1-2 puffs into the lungs every 4 (four) hours as needed for wheezing or shortness of breath.    2016  . sertraline (ZOLOFT) 100 MG tablet Take one tablet by mouth every morning.  0 07/29/2015 at Unknown time  . ELIQUIS 5 MG TABS tablet Take 5 mg by mouth as directed.  1   . oxyCODONE (OXY IR/ROXICODONE) 5 MG immediate release tablet Take 1-2 tablets (5-10 mg total) by mouth every 4 (four) hours as needed for moderate  pain. (Patient not taking: Reported on 07/30/2015) 30 tablet 0    Infusions:  . sodium chloride 100 mL/hr at 08/01/15 1024  . heparin 1,400 Units/hr (08/01/15 1103)    Assessment: 54 yoF s/p colon resection/diverging ileostomy on 06/27/15 for colon stricture/obstruction presents with abdominal pains and AKI.  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  Med history indicates that patient has a new prescription for apixaban but was not started. Heparin drip started on 3/21 for thrombosis.  Today, 08/01/2015: - confirmatory heparin level now back therapeutic at 0.40 - hgb down slightly to 10, plt stable - No bleeding documented  Goal of Therapy:  Heparin level 0.3-0.7 units/ml Monitor platelets by anticoagulation protocol: Yes   Plan:  - continue heparin infusion at 1400 units/hr. - Daily CBC and HL while on heparin infusion. - monitor for s/s bleeding    Lynelle Doctor, PharmD 08/01/2015,1:00 PM

## 2015-08-02 DIAGNOSIS — D519 Vitamin B12 deficiency anemia, unspecified: Secondary | ICD-10-CM

## 2015-08-02 DIAGNOSIS — I745 Embolism and thrombosis of iliac artery: Secondary | ICD-10-CM

## 2015-08-02 DIAGNOSIS — D529 Folate deficiency anemia, unspecified: Secondary | ICD-10-CM

## 2015-08-02 HISTORY — DX: Embolism and thrombosis of iliac artery: I74.5

## 2015-08-02 LAB — CBC
HCT: 26.2 % — ABNORMAL LOW (ref 36.0–46.0)
HCT: 28.8 % — ABNORMAL LOW (ref 36.0–46.0)
HEMOGLOBIN: 8.6 g/dL — AB (ref 12.0–15.0)
Hemoglobin: 9.2 g/dL — ABNORMAL LOW (ref 12.0–15.0)
MCH: 26.8 pg (ref 26.0–34.0)
MCH: 27.3 pg (ref 26.0–34.0)
MCHC: 32.8 g/dL (ref 30.0–36.0)
MCHC: 31.9 g/dL (ref 30.0–36.0)
MCV: 81.6 fL (ref 78.0–100.0)
MCV: 85.5 fL (ref 78.0–100.0)
PLATELETS: 193 10*3/uL (ref 150–400)
Platelets: 193 10*3/uL (ref 150–400)
RBC: 3.21 MIL/uL — ABNORMAL LOW (ref 3.87–5.11)
RBC: 3.37 MIL/uL — AB (ref 3.87–5.11)
RDW: 14.6 % (ref 11.5–15.5)
RDW: 14.8 % (ref 11.5–15.5)
WBC: 8.1 10*3/uL (ref 4.0–10.5)
WBC: 7.8 10*3/uL (ref 4.0–10.5)

## 2015-08-02 LAB — BASIC METABOLIC PANEL
ANION GAP: 5 (ref 5–15)
BUN: 20 mg/dL (ref 6–20)
CALCIUM: 8.6 mg/dL — AB (ref 8.9–10.3)
CO2: 19 mmol/L — AB (ref 22–32)
Chloride: 114 mmol/L — ABNORMAL HIGH (ref 101–111)
Creatinine, Ser: 0.7 mg/dL (ref 0.44–1.00)
GFR calc Af Amer: >60 mL/min (ref >60)
GLUCOSE: 96 mg/dL (ref 65–99)
Potassium: 4.7 mmol/L (ref 3.5–5.1)
Sodium: 138 mmol/L (ref 135–145)

## 2015-08-02 LAB — IRON AND TIBC
Iron: 32 ug/dL (ref 28–170)
Saturation Ratios: 16 % (ref 10.4–31.8)
TIBC: 206 ug/dL — ABNORMAL LOW (ref 250–450)
UIBC: 174 ug/dL

## 2015-08-02 LAB — RETICULOCYTES
RBC.: 3.37 MIL/uL — AB (ref 3.87–5.11)
RETIC COUNT ABSOLUTE: 43.8 10*3/uL (ref 19.0–186.0)
RETIC CT PCT: 1.3 % (ref 0.4–3.1)

## 2015-08-02 LAB — FOLATE: Folate: 3.6 ng/mL — ABNORMAL LOW (ref >5.9)

## 2015-08-02 LAB — FERRITIN: FERRITIN: 249 ng/mL (ref 11–307)

## 2015-08-02 LAB — VITAMIN B12: VITAMIN B 12: 232 pg/mL (ref 180–914)

## 2015-08-02 MED ORDER — APIXABAN 5 MG PO TABS
10.0000 mg | ORAL_TABLET | Freq: Two times a day (BID) | ORAL | Status: DC
  Administered 2015-08-02 – 2015-08-04 (×5): 10 mg via ORAL
  Filled 2015-08-02 (×6): qty 2

## 2015-08-02 MED ORDER — APIXABAN 5 MG PO TABS: 5.0000 mg | ORAL_TABLET | Freq: Two times a day (BID) | ORAL | Status: DC

## 2015-08-02 MED ORDER — DIPHENOXYLATE-ATROPINE 2.5-0.025 MG PO TABS
1.0000 | ORAL_TABLET | Freq: Three times a day (TID) | ORAL | Status: DC
  Administered 2015-08-02 – 2015-08-03 (×4): 1 via ORAL
  Filled 2015-08-02 (×4): qty 1

## 2015-08-02 MED ORDER — CYANOCOBALAMIN 1000 MCG/ML IJ SOLN
1000.0000 ug | Freq: Every day | INTRAMUSCULAR | Status: DC
  Administered 2015-08-02 – 2015-08-04 (×3): 1000 ug via SUBCUTANEOUS
  Filled 2015-08-02 (×3): qty 1

## 2015-08-02 MED ORDER — FOLIC ACID 5 MG/ML IJ SOLN
1.0000 mg | Freq: Every day | INTRAMUSCULAR | Status: DC
  Administered 2015-08-02: 1 mg via INTRAVENOUS
  Filled 2015-08-02 (×2): qty 0.2

## 2015-08-02 NOTE — Progress Notes (Signed)
[name] for Eliquis Indication: splenic infarcts, abdominal aorta thrombosis  Allergies  Allergen Reactions  . Codeine Other (See Comments)    "hyper"    Patient Measurements: Height: 5\' 2"  (157.5 cm) Weight: 193 lb 6.4 oz (87.726 kg) IBW/kg (Calculated) : 50.1 HEPARIN DW (KG): 69.4  Vital Signs: Temp: 98.3 F (36.8 C) (03/23 0553) Temp Source: Oral (03/23 0553) BP: 133/43 mmHg (03/23 0618) Pulse Rate: 69 (03/23 0618)  Labs:  Recent Labs  07/31/15 0026  07/31/15 0856 07/31/15 1708 08/01/15 0221 08/01/15 1116 08/02/15 0451 08/02/15 0813  HGB  --   < >  --   --  10.0*  --  8.6* 9.2*  HCT  --   < >  --   --  30.9*  --  26.2* 28.8*  PLT  --   < >  --   --  221  --  193 193  APTT 28  --   --   --   --   --   --   --   LABPROT 15.4*  --   --   --   --   --   --   --   INR 1.20  --   --   --   --   --   --   --   HEPARINUNFRC  --   < > 0.34 0.25* 0.46 0.40  --   --   CREATININE  --   --  3.12*  --  1.32*  --  0.70  --   < > = values in this interval not displayed. Estimated Creatinine Clearance: 83.6 mL/min (by C-G formula based on Cr of 0.7).  Assessment: 44 yoF s/p colon resection/diverging ileostomy on 06/27/15 for colon stricture/obstruction presents with abdominal pains and AKI. 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 Med history indicates that patient has a new prescription for apixaban but was not started. Heparin drip started on 3/21 for thrombosis.  3/22  Pharmacy now asked to transition from Heparin drip to Eliquis for discharge planning.  She received her first dose on 3/22 PM 3/23  Eliquis orders temporarily d/c, but resumed today.  She does not require any dose adjustments based on her age, wt, and Scr.  Hgb is low but improved.  No active bleeding reported.   Goal of Therapy:  Therapeutic anticoagulation   Plan:  Eliquis 10mg  po BID x 7 days followed by Eliquis 5mg  po  BID. Pharmacy to provide Eliquis education prior to discharge.  Cathy Jones PharmD, BCPS Pager [phone] 08/02/2015 12:00 PM

## 2015-08-02 NOTE — Progress Notes (Signed)
Acute renal failure (ARF) (HCC)  Subjective: Pt feeling better. Ostomy recorded at 1078ml yesterday.  Objective: Vital signs in last 24 hours: Temp:  [98.1 F (36.7 C)-98.5 F (36.9 C)] 98.3 F (36.8 C) (03/23 0553) Pulse Rate:  [55-73] 69 (03/23 0618) Resp:  [16] 16 (03/23 0553) BP: (84-133)/(25-43) 133/43 mmHg (03/23 0618) SpO2:  [93 %-100 %] 100 % (03/23 0553) Weight:  [87.726 kg (193 lb 6.4 oz)] 87.726 kg (193 lb 6.4 oz) (03/22 1421) Last BM Date: 08/02/15  Intake/Output from previous day: 03/22 0701 - 03/23 0700 In: 3310.4 [P.O.:480; I.V.:2830.4] Out: 3800 [Urine:2750; Stool:1050] Intake/Output this shift:    General appearance: alert and cooperative GI: soft Ostomy output still very thin Lab Results:  Results for orders placed or performed during the hospital encounter of 07/30/15 (from the past 24 hour(s))  Basic metabolic panel     Status: Abnormal   Collection Time: 08/02/15  4:51 AM  Result Value Ref Range   Sodium 138 135 - 145 mmol/L   Potassium 4.7 3.5 - 5.1 mmol/L   Chloride 114 (H) 101 - 111 mmol/L   CO2 19 (L) 22 - 32 mmol/L   Glucose, Bld 96 65 - 99 mg/dL   BUN 20 6 - 20 mg/dL   Creatinine, Ser 0.70 0.44 - 1.00 mg/dL   Calcium 8.6 (L) 8.9 - 10.3 mg/dL   GFR calc non Af Amer >60 >60 mL/min   GFR calc Af Amer >60 >60 mL/min   Anion gap 5 5 - 15  CBC     Status: Abnormal   Collection Time: 08/02/15  4:51 AM  Result Value Ref Range   WBC 8.1 4.0 - 10.5 K/uL   RBC 3.21 (L) 3.87 - 5.11 MIL/uL   Hemoglobin 8.6 (L) 12.0 - 15.0 g/dL   HCT 26.2 (L) 36.0 - 46.0 %   MCV 81.6 78.0 - 100.0 fL   MCH 26.8 26.0 - 34.0 pg   MCHC 32.8 30.0 - 36.0 g/dL   RDW 14.6 11.5 - 15.5 %   Platelets 193 150 - 400 K/uL  Vitamin B12     Status: None   Collection Time: 08/02/15  8:08 AM  Result Value Ref Range   Vitamin B-12 232 180 - 914 pg/mL  Folate     Status: Abnormal   Collection Time: 08/02/15  8:08 AM  Result Value Ref Range   Folate 3.6 (L) >5.9 ng/mL  Iron and  TIBC     Status: Abnormal   Collection Time: 08/02/15  8:08 AM  Result Value Ref Range   Iron 32 28 - 170 ug/dL   TIBC 206 (L) 250 - 450 ug/dL   Saturation Ratios 16 10.4 - 31.8 %   UIBC 174 ug/dL  Ferritin     Status: None   Collection Time: 08/02/15  8:08 AM  Result Value Ref Range   Ferritin 249 11 - 307 ng/mL  CBC     Status: Abnormal   Collection Time: 08/02/15  8:13 AM  Result Value Ref Range   WBC 7.8 4.0 - 10.5 K/uL   RBC 3.37 (L) 3.87 - 5.11 MIL/uL   Hemoglobin 9.2 (L) 12.0 - 15.0 g/dL   HCT 28.8 (L) 36.0 - 46.0 %   MCV 85.5 78.0 - 100.0 fL   MCH 27.3 26.0 - 34.0 pg   MCHC 31.9 30.0 - 36.0 g/dL   RDW 14.8 11.5 - 15.5 %   Platelets 193 150 - 400 K/uL  Reticulocytes  Status: Abnormal   Collection Time: 08/02/15  8:13 AM  Result Value Ref Range   Retic Ct Pct 1.3 0.4 - 3.1 %   RBC. 3.37 (L) 3.87 - 5.11 MIL/uL   Retic Count, Manual 43.8 19.0 - 186.0 K/uL     Studies/Results Radiology     MED[facility], Scheduled . atorvastatin  10 mg Oral QPC supper  . diphenoxylate-atropine  1 tablet Oral TID AC & HS  . loperamide  4 mg Oral TID AC & HS  . LORazepam  0.5 mg Oral q morning - 10a  . nystatin   Topical TID  . pantoprazole (PROTONIX) IV  40 mg Intravenous QHS  . sertraline  100 mg Oral q morning - 10a     Assessment: Acute renal failure (ARF) ([place])  Plan: I will cont scheduled imodium to help limit her ostomy output and add Lomotil today.  She will need to measure her output at home and call the office if this is >1L.  Recommend strict I&O's to follow ostomy output while in the hospital.  Will follow with you.  Anticoagulation started for aortic clot.  This was discussed with Dr Donnetta Hutching.  We will have her f/u with them as an outpatient.    She does have a small fluid collection in her pelvis.  This may be a contained leak.  She is diverted, so I think it is ok to watch this for now.      LOS: 3 days    Rosario Adie, [facility],  [place]   08/02/2015 11:51 AM

## 2015-08-02 NOTE — Discharge Instructions (Addendum)
Ostomy: Measure and record output daily.  If output >1L for more than 2 days, call the office for instructions.  If output > 1500 ml for >1 day, call the office.  Continue your Imodium before meals and at bedtime.  Ostomy Support Information  Theresia Majors heard that people get along just fine with only one of their eyes, or one of their lungs, or one of their kidneys. But you also know that you have only one intestine and only one bladder, and that leaves you feeling awfully empty, both physically and emotionally: You think no other people go around without part of their intestine with the ends of their intestines sticking out through their abdominal walls.   YOU ARE NOT ALONE.  There are nearly three quarters of a million people in the [place] who have an ostomy; people who have had surgery to remove all or part of their colons or bladders.   There is even a national association, the [place] [employer] [place] with over 350 local affiliated support groups that are organized by volunteers who provide peer support and counseling. Juan Quam has a toll free telephone num-ber, [phone] and an educational, interactive website, www.ostomy.org   An ostomy is an opening in the belly (abdominal wall) made by surgery. Ostomates are people who have had this procedure. The opening (stoma) allows the kidney or bowel to discharge waste. An external pouch covers the stoma to collect waste. Pouches are are a simple bag and are odor free. Different companies have disposable or reusable pouches to fit one's lifestyle. An ostomy can either be temporary or permanent.   THERE ARE THREE MAIN TYPES OF OSTOMIES  Colostomy. A colostomy is a surgically created opening in the large intestine (colon).  Ileostomy. An ileostomy is a surgically created opening in the small intestine.  Urostomy. A urostomy is a surgically created opening to divert urine away from the bladder.  OSTOMY Care  The following guidelines will make  care of your colostomy easier. Keep this information close by for quick reference.  Helpful DIET hints Eat a well-balanced diet including vegetables and fresh fruits. Eat on a regular schedule. Drink at least 6 to 8 glasses of fluids daily. Eat slowly in a relaxed atmosphere. Chew your food thoroughly. Avoid chewing gum, smoking, and drinking from a straw. This will help decrease the amount of air you swallow, which may help reduce gas. Eating yogurt or drinking buttermilk may help reduce gas.  To control gas at night, do not eat after 8 p.m. This will give your bowel time to quiet down before you go to bed.  If gas is a problem, you can purchase Beano. Sprinkle Beano on the first bite of food before eating to reduce gas. It has no flavor and should not change the taste of your food. You can buy Beano over the counter at your local drugstore.  Foods like fish, onions, garlic, broccoli, asparagus, and cabbage produce odor. Although your pouch is odor-proof, if you eat these foods you may notice a stronger odor when emptying your pouch. If this is a concern, you may want to limit these foods in your diet.  If you have an ileostomy, you will have chronic diarrhea & need to drink more liquids to avoid getting dehydrated.  Consider antidiarrheal medicine like imodium (loperamide) or Lomotil to help slow down bowel movements / diarrhea into your ileostomy bag.  GETTING TO GOOD BOWEL HEALTH WITH AN ILEOSTOMY . Irregular bowel habits such as constipation and  diarrhea can lead to many problems over time.  The goal: 3-6 small BOWEL MOVEMENTS A DAY!  To have soft, regular bowel movements:   Drink plenty of fluids, consider 4-6 tall glasses of water a day.    Controlling diarrheA  o Switch to liquids and simpler foods for a few days to avoid stressing your intestines further. o Avoid dairy products (especially milk & ice cream) for a short time.  The intestines often can lose the ability to digest  lactose when stressed. o Avoid foods that cause gassiness or bloating.  Typical foods include beans and other legumes, cabbage, broccoli, and dairy foods.  Every person has some sensitivity to other foods, so listen to our body and avoid those foods that trigger problems for you. o Adding fiber (Citrucel, Metamucil, psyllium, Miralax) gradually can help thicken stools by absorbing excess fluid and retrain the intestines to act more normally.  Slowly increase the dose over a few weeks.  Too much fiber too soon can backfire and cause cramping & bloating. o Probiotics (such as active yogurt, Align, etc) may help repopulate the intestines and colon with normal bacteria and calm down a sensitive digestive tract.  Most studies show it to be of mild help, though, and such products can be costly. o Medicines: - Bismuth subsalicylate (ex. Kayopectate, Pepto Bismol) every 30 minutes for up to 6 doses can help control diarrhea.  Avoid if pregnant. - Loperamide (Immodium) can slow down diarrhea.  Start with two tablets (3m total) first and then try one tablet every 6 hours.  Avoid if you are having fevers or severe pain.  If you are not better or start feeling worse, stop all medicines and call your doctor for advice o Call your doctor if you are getting worse or not better.  Sometimes further testing (cultures, endoscopy, X-ray studies, bloodwork, etc) may be needed to help diagnose and treat the cause of the diarrhea.  TROUBLESHOOTING IRREGULAR BOWELS 1) Avoid extremes of bowel movements (no bad constipation/diarrhea) 2) Miralax 17gm mixed in 8oz. water or juice-daily. May use twice a day as needed  3) Gas-x,Phazyme, etc. as needed for gas & bloating.  4) Soft,bland diet. No spicy,greasy,fried foods.  5) Prilosec (omeprazole) over-the-counter as needed  6) May hold gluten/wheat products from diet to see if symptoms improve.  7)  May try probiotics (Align, Activa, etc) to help calm the bowels down 7) If  symptoms become worse call back immediately.   Applying the pouching system To apply your pouch, follow these steps:  Place all your equipment close at hand before removing your pouch.  Wash your hands.  Stand or sit in front of a mirror. Use the position that works best for you. Remember that you must keep the skin around the stoma wrinkle-free for a good seal.  Gently remove the used pouch (1-piece system) or the pouch and old wafer (2-piece system). Empty the pouch into the toilet. Save the closure clip to use again.  Wash the stoma itself and the skin around the stoma. Your stoma may bleed a little when being washed. This is normal. Rinse and pat dry. You may use a wash cloth or soft paper towels (like Bounty), mild soap (like Dial, Safeguard, or [place], and water. Avoid soaps that contain perfumes or lotions.  For a new pouch (1-piece system) or a new wafer (2-piece system), measure your stoma using the stoma guide in each box of supplies.  Trace the shape of your stoma onto  the back of the new pouch or the back of the new wafer. Cut out the opening. Remove the paper backing and set it aside.  Optional: Apply a skin barrier powder to surrounding skin if it is irritated (bare or weeping), and dust off the excess. Optional: Apply a skin-prep wipe (such as Skin Prep or All-Kare) to the skin around the stoma, and let it dry. Do not apply this solution if the skin is irritated (red, tender, or broken) or if you have shaved around the stoma. Optional: Apply a skin barrier paste (such as Stomahesive, Coloplast, or Premium) around the opening cut in the back of the pouch or wafer. Allow it to dry for 30 to 60 seconds.  Hold the pouch (1-piece system) or wafer (2-piece system) with the sticky side toward your body. Make sure the skin around the stoma is wrinkle-free. Center the opening on the stoma, then press firmly to your abdomen (Fig. 4). Look in the mirror to check if you are placing the  pouch, or wafer, in the right position. For a 2-piece system, snap the pouch onto the wafer. Make sure it snaps into place securely.  Place your hand over the stoma and the pouch or wafer for about 30 seconds. The heat from your hand can help the pouch or wafer stick to your skin.  Add deodorant (such as Super Banish or Nullo) to your pouch. Other options include food extracts such as vanilla oil and peppermint extract. Add about 10 drops of the deodorant to the pouch. Then apply the closure clamp. Note: Do not use toxic  chemicals or commercial cleaning agents in your pouch. These substances may harm the stoma.  Optional: For extra seal, apply tape to all 4 sides around the pouch or wafer, as if you were framing a picture. You may use any brand of medical adhesive tape. Change your pouch every 5 to 7 days. Change it immediately if a leak occurs.  Wash your hands afterwards.  If you are wearing a 2-piece system, you may use 2 new pouches per week and alternate them. Rinse the pouch with mild soap and warm water and hang it to dry for the next day. Apply the fresh pouch. Alternate the 2 pouches like this for a week. After a week, change the wafer and begin with 2 new pouches. Place the old pouches in a plastic bag, and put them in the trash.    Tips for colostomy care  Applying Your Pouch You may stand or sit to apply your pouch.  Keep the skin where you apply the pouch wrinkle-free. If the skin around the pouch is wrinkled, the seal may break when your skin stretches.  If hair grows close to your stoma, you may trim off the hair with scissors, an electric razor, or a safety razor.  Always have a mirror nearby so you can get a better view of your stoma.  When you apply a new pouch, write the date on the adhesive tape. This will remind you of when you last changed your pouch.  Changing Your Pouch The best time to change your pouch is in the morning, before eating or drinking anything. Your  stoma can function at any time, but it will function more after eating or drinking.  Emptying Your Pouch Empty your pouch when it is one-third full (of urine, stool, and/or gas). If you wait until your pouch is fuller than this, it will be more difficult to empty and more  noticeable. When you empty your pouch, either put toilet paper in the toilet bowl first, or flush the toilet while you empty the pouch. This will reduce splashing. You can empty the pouch between your legs or to one side while sitting, or while standing or stooping. If you have a 2-piece system, you can snap off the pouch to empty it. Remember that your stoma may function during this time. If you wish to rinse your pouch after you empty it, a [place] baster can be helpful. When using a baster, squirt water up into the pouch through the opening at the bottom. With a 2-piece system, you can snap off the pouch to rinse it. After rinsing  your pouch, empty it into the toilet. When rinsing your pouch at home, put a few granules of Dreft soap in the rinse water. This helps lubricate and freshen your pouch. The inside of your pouch can be sprayed with non-stick cooking oil (Pam spray). This may help reduce stool sticking to the inside of the pouch.  Bathing You may shower or bathe with your pouch on or off. Remember that your stoma may function during this time.  The materials you use to wash your stoma and the skin around it should be clean, but they do not need to be sterile.  Wearing Your Pouch During hot weather, or if you perspire a lot in general, wear a cover over your pouch. This may prevent a rash on your skin under the pouch. Pouch covers are sold at ostomy supply stores. Wear the pouch inside your underwear for better support. Watch your weight. Any gain or loss of 10 to 15 pounds or more can change the way your pouch fits.  Going Away From Home A collapsible cup (like those that come in travel kits) or a soft plastic squirt  bottle with a pull-up top (like a travel bottle for shampoo) can be used for rinsing your pouch when you are away from home. Tilt the opening of the pouch at an upward angle when using a cup to rinse.  Carry wet wipes or extra tissues to use in public bathrooms.  Carry an extra pouching system with you at all times.  Never keep ostomy supplies in the glove compartment of your car. Extreme heat or cold can damage the skin barriers and adhesive wafers on the pouch.  When you travel, carry your ostomy supplies with you at all times. Keep them within easy reach. Do not pack ostomy supplies in baggage that will be checked or otherwise separated from you, because your baggage might be lost. If youre traveling out of the country, it is helpful to have a letter stating that you are carrying ostomy supplies as a medical necessity.  If you need ostomy supplies while traveling, look in the yellow pages of the telephone book under Surgical Supplies. Or call the local ostomy organization to find out where supplies are available.  Do not let your ostomy supplies get low. Always order new pouches before you use the last one.  Reducing Odor Limit foods such as broccoli, cabbage, onions, fish, and garlic in your diet to help reduce odor. Each time you empty your pouch, carefully clean the opening of the pouch, both inside and outside, with toilet paper. Rinse your pouch 1 or 2 times daily after you empty it (see directions for emptying your pouch and going away from home). Add deodorant (such as Super Banish or Nullo) to your pouch. Use air deodorizers in  your bathroom. Do not add aspirin to your pouch. Even though aspirin can help prevent odor, it could cause ulcers on your stoma.  When to call the doctor Call the doctor if you have any of the following symptoms: Purple, black, or white stoma Severe cramps lasting more than 6 hours Severe watery discharge from the stoma lasting more than 6 hours No  output from the colostomy for 3 days Excessive bleeding from your stoma Swelling of your stoma to more than 1/2-inch larger than usual Pulling inward of your stoma below skin level Severe skin irritation or deep ulcers Bulging or other changes in your abdomen  When to call your ostomy nurse Call your ostomy/enterostomal therapy (ET) nurse if any of the following occurs: Frequent leaking of your pouching system Change in size or appearance of your stoma, causing discomfort or problems with your pouch Skin rash or rawness Weight gain or loss that causes problems with your pouch      FREQUENTLY ASKED QUESTIONS   Why havent you met any of these folks who have an ostomy?  Well, maybe you have! You just did not recognize them because an ostomy doesn't show. It can be kept secret if you wish. Why, maybe some of your best friends, office associates or neighbors have an ostomy ... you never can tell.   People facing ostomy surgery have many quality-of-life questions like:  Will you bulge? Smell? Make noises? Will you feel waste leaving your body? Will you be a captive of the toilet? Will you starve? Be a social outcast? Get/stay married? Have babies? Easily bathe, go swimming, bend over?  OK, lets look at what you can expect:   Will you bulge?  Remember, without part of the intestine or bladder, and its contents, you should have a flatter tummy than before. You can expect to wear, with little exception, what you wore before surgery ... and this in-cludes tight clothing and bathing suits.   Will you smell?  Today, thanks to modern odor proof pouching systems, you can walk into an ostomy support group meeting and not smell anything that is foul or offensive. And, for those with an ileostomy or colostomy who are concerned about odor when emptying their pouch, there are in-pouch deodorants that can be used to eliminate any waste odors that may exist.   Will you make noises?  Everyone produces  gas, especially if they are an air-swallower. But intestinal sounds that occur from time to time are no differ-ent than a gurgling tummy, and quite often your clothing will muffle any sounds.   Will you feel the waste discharges?  For those with a colostomy or ileostomy there might be a slight pressure when waste leaves your body, but understand that the intestines have no nerve endings, so there will be no unpleasant sensations. Those with a urostomy will probably be unaware of any kidney drainage.   Will you be a captive of the toilet?  Immediately post-op you will spend more time in the bathroom than you will after your body recovers from surgery. Every person is different, but on average those with an ileostomy or urostomy may empty their pouches 4 to 6 times a day; a little  less if you have a colostomy. The average wear time between pouch system changes is 3 to 5 days and the changing process should take less than 30 minutes.   Will I need to be on a special diet? Most people return to their normal diet when  they have recovered from surgery. Be sure to chew your food well, eat a well-balanced diet and drink plenty of fluids. If you experience problems with a certain food, wait a couple of weeks and try it again.  Will there be odor and noises? Pouching systems are designed to be odor-proof or odor-resistant. There are deodorants that can be used in the pouch. Medications are also available to help reduce odor. Limit gas-producing foods and carbonated beverages. You will experience less gas and fewer noises as you heal from surgery.  How much time will it take to care for my ostomy? At first, you may spend a lot of time learning about your ostomy and how to take care of it. As you become more comfortable and skilled at changing the pouching system, it will take very little time to care for it.   Will I be able to return to work? People with ostomies can perform most jobs. As soon as you have  healed from surgery, you should be able to return to work. Heavy lifting (more than 10 pounds) may be discouraged.   What about intimacy? Sexual relationships and intimacy are important and fulfilling aspects of your life. They should continue after ostomy surgery. Intimacy-related concerns should be discussed openly between you and your partner.   Can I wear regular clothing? You do not need to wear special clothing. Ostomy pouches are fairly flat and barely noticeable. Elastic undergarments will not hurt the stoma or prevent the ostomy from functioning.   Can I participate in sports? An ostomy should not limit your involvement in sports. Many people with ostomies are runners, skiers, swimmers or participate in other active lifestyles. Talk with your caregiver first before doing heavy physical activity.  Will you starve?  Not if you follow doctors orders at each stage of your post-op adjustment. There is no such thing as an ostomy diet. Some people with an ostomy will be able to eat and tolerate anything; others may find diffi-culty with some foods. Each person is an individual and must determine, by trial, what is best for them. A good practice for all is to drink plenty of water.   Will you be a social outcast?  Have you met anyone who has an ostomy and is a social outcast? Why should you be the first? Only your attitude and self image will effect how you are treated. No confi-dent person is an Occupational psychologist.     PROFESSIONAL HELP  Resources are available if you need help or have questions about your ostomy.    Specially trained nurses called Wound, Ostomy Continence Nurses (WOCN) are available for consultation in most major medical centers.   Consider getting an ostomy consult with Cena Benton at [facility] to help troubleshoot stoma pouch fittings and other issues with your ostomy: [phone]   The [employer] Ostomy Association (UOA) is a group made up of many local  chapters throughout the [place]. These local groups hold meetings and provide support to prospective and existing ostomates. They sponsor educational events and have qualified visitors to make personal or telephone visits. Contact the UOA for the chapter nearest you and for other educational publications.   More detailed information can be found in Colostomy Guide, a publication of the [employer] (UOA). Contact UOA at [phone] 703-9427 or visit their web site at https://arellano.[email or web address]/. The website contains links to other sites, suppliers and resources.   Tree surgeon Start Services:  Start at the website to enlist for  support.  Your Wound Ostomy (WOCN) nurse may have started this process. https://www.hollister.com/en/securestart  Secure Start services are designed to support people as they live their lives with an ostomy or neurogenic bladder. Enrolling is easy and at no cost to the patient. We realize that each person's needs and life journey are different. Through Secure Start services, we want to help people live their life, their way.  Ileostomy Home Guide An ileostomy is an opening for stool to leave your body when a medical condition prevents it from leaving through the usual opening (rectum). During a surgery, a piece of small intestine (ileum) is brought through a hole in the abdominal wall. The new opening is called a stoma or ostomy. A bag or pouch fits over the stoma to catch stool and gas. Your stool may be liquid at first. Over time, it may become about the consistency of applesauce. CARING FOR YOUR STOMA Normally, the stoma looks a lot like the inside of your cheek: pink and moist. At first, it may be swollen, but this swelling will decrease within 6 weeks. Keep the skin around the stoma clean and dry. You can gently wash your stoma in the shower with a clean, soft washcloth. If you develop any skin irritation, your caregiver may give you a stoma powder or ointment to help  heal the area. Do not use any products other than those specifically given to you by your caregiver.  Your stoma should not be uncomfortable. If you notice any stinging or burning, your pouch may be leaking, and the skin around your stoma may be coming into contact with stool. This can cause skin irritation. If you notice stinging, you should replace your pouch with a new one and discard the old one. OSTOMY POUCHES The pouch that fits over the ostomy can be made up of either 1 or 2 pieces. A one-piece pouch has a skin barrier piece and the pouch itself in one unit. A two-piece pouch has a skin barrier with a separate pouch that snaps on and off of the skin barrier. Either way, you should empty the pouch when it is only  to  full. Do not let more stool or gas build up. This could cause the pouch to leak. Some ostomy bags have a built-in gas release valve. Ostomy deodorizer (5 drops) can be put into the pouch to prevent odor. Some people use an ostomy lubricant to help the stool slide out of the bag more easily and completely.  EMPTYING YOUR OSTOMY POUCH You may get lessons on how to empty your pouch from a wound-ostomy nurse before you leave the hospital. Here are the basic steps:  Wash your hands with soap and water.  Sit far back on the toilet.  Put pieces of toilet paper into the toilet water. This will prevent splashing as you empty the stool into the toilet bowl.  Unclip or unvelcro the tail end of the pouch.  Unroll the tail and empty stool into the toilet.  Clean the tail with toilet paper.  Reroll the tail, and clip or velcro it closed.  Wash your hands again. CHANGING YOUR OSTOMY POUCH Change your ostomy pouch about every 3 to 4 days for the first 6 weeks, then every 5 to 7 days. Always change the bag sooner if you begin to notice any discomfort or irritation of the skin around the stoma. When possible, plan to change your ostomy pouching system before eating and drinking because  this will lessen the  chance of stool coming out during the change. A wound-ostomy nurse may teach you how to change your pouch before you leave the hospital. Here are the basic steps:  Lay out your supplies.  Wash your hands with soap and water.  Carefully remove the old pouch.  Wash the stoma and the skin around the stoma. Allow the area to dry. Men may be advised to shave any hair around the stoma very carefully. This will make the adhesive stick better.  Use the stoma measuring guide that comes with your pouch set to decide what size hole you will need to cut in the skin barrier piece. Choose the smallest possible size that will hold the stoma but will not touch it.  Use the guide to trace the circle on the back of the skin barrier piece. Cut out the hole.  Hold the skin barrier piece over the stoma to make sure the hole is the correct size.  Remove the adhesive paper backing from the skin barrier piece.  Squeeze stoma paste around the opening of the skin barrier piece.  Clean and dry the skin around the stoma again.  Carefully fit the skin barrier piece over your stoma.  If you are using a two-piece pouch, snap the pouch onto the skin barrier piece.  Close the tail of the pouch.  Put your hand over the top of the skin barrier piece to help warm it for about 5 minutes, so that it conforms to your body better.  Wash your hands again. DIET TIPS Because you have a higher risk of a blockage in the first 2 months after getting an ileostomy, you should decrease your fiber intake for that time period. Fibrous foods include:  Celery.  Cabbage (and coleslaw).  Pineapple.  Mushrooms.  Corn and popcorn.  Whole fruits and vegetables, especially with the skins on.  Foods that contain seeds.  Oranges, grapefruit, tangerines, and other citrus fruit.  Nuts. After about 2 months, you can slowly add these kinds of foods back into your regular diet, as tolerated. Other  tips:  Drink about eight, 8 oz glasses of water each day.  You can prevent gas by eating slowly and chewing your food thoroughly.  If you feel concerned that you have too much gas, you can cut back on gas-producing foods, such as:  Spicy foods.  Onions and garlic.  Cruciferous vegetables (cabbage, broccoli, cauliflower, Brussels sprouts).  Beans and legumes.  Some cheeses.  Eggs.  Fish.  Bubbly (carbonated) drinks.  Chewing gum. GENERAL TIPS  You can shower with or without the bag in place.  Always keep the bag snapped on if you are bathing or swimming.  If your bag gets wet, you can dry it with a blow-dryer set to cool.  Avoid wearing tight clothing directly over your stoma so that it does not become irritated or bleed. Tight clothing can also prevent the stool from draining into the pouch, which can cause it to leak.  It is helpful to always have an extra skin barrier and pouch with you when traveling. Do not leave them anywhere too warm, because parts of them can melt.  Do not let your seat belt rest on your stoma. Try to keep the seat belt either above or below your stoma, or use a tiny pillow to cushion it.  You can still participate in sports, but you should avoid activities in which there is a risk of getting hit in the abdomen.  You can still  have sex. It is a good idea to empty your pouch prior to sex. Some people and their partners feel very comfortable seeing the pouch during sex. Others choose to wear lingerie or a T-shirt that covers the device. SEEK IMMEDIATE MEDICAL CARE IF:  You feel dizzy, light-headed, or faint.  You measure pouch drainage of more than 1,500 mL per day. This amount of drainage can lead to dehydration.  You notice a change in the size or color of the stoma, especially if it becomes very red, purple, black, or pale white.  You have bloody stools or bleeding from the ostomy.  You have abdominal pain, nausea, vomiting, or  bloating.  There is anything unusual protruding from the ostomy.  You have irritation or red skin around the ostomy.  No stool is passing from the stoma.  You have diarrhea (requiring pouch emptying more frequently than normal).   This information is not intended to replace advice given to you by your health care provider. Make sure you discuss any questions you have with your health care provider.   Document Released: 05/01/2003 Document Revised: 05/19/2014 Document Reviewed: 09/25/2010 Elsevier Interactive Patient Education 2016 [place] on my medicine - ELIQUIS (apixaban)  This medication education was reviewed with me or my healthcare representative as part of my discharge preparation.  The pharmacist that spoke with me during my hospital stay was:   Altha Harm  Why was Eliquis prescribed for you? Eliquis was prescribed to treat blood clots that may have been found in the veins of your legs (deep vein thrombosis) or in your lungs (pulmonary embolism) and to reduce the risk of them occurring again.  What do You need to know about Eliquis ? The starting dose is 10 mg (two 5 mg tablets) taken TWICE daily for the FIRST SEVEN (7) DAYS, then on (enter date)  3/29 at evening dose  the dose is reduced to ONE 5 mg tablet taken TWICE daily.  Eliquis may be taken with or without food.   Try to take the dose about the same time in the morning and in the evening. If you have difficulty swallowing the tablet whole please discuss with your pharmacist how to take the medication safely.  Take Eliquis exactly as prescribed and DO NOT stop taking Eliquis without talking to the doctor who prescribed the medication.  Stopping may increase your risk of developing a new blood clot.  Refill your prescription before you run out.  After discharge, you should have regular check-up appointments with your healthcare provider that is prescribing your Eliquis.    What do you do if  you miss a dose? If a dose of ELIQUIS is not taken at the scheduled time, take it as soon as possible on the same day and twice-daily administration should be resumed. The dose should not be doubled to make up for a missed dose.  Important Safety Information A possible side effect of Eliquis is bleeding. You should call your healthcare provider right away if you experience any of the following: ? Bleeding from an injury or your nose that does not stop. ? Unusual colored urine (red or dark brown) or unusual colored stools (red or black). ? Unusual bruising for unknown reasons. ? A serious fall or if you hit your head (even if there is no bleeding).  Some medicines may interact with Eliquis and might increase your risk of bleeding or clotting while on Eliquis. To help avoid this, consult your healthcare  provider or pharmacist prior to using any new prescription or non-prescription medications, including herbals, vitamins, non-steroidal anti-inflammatory drugs (NSAIDs) and supplements.  This website has more information on Eliquis (apixaban): http://[email or web address].com/eliquis/home

## 2015-08-02 NOTE — Progress Notes (Signed)
[facility] Progress Note   Cathy Jones  [id number]  DOB: Oct 11, 1960  DOA: 07/30/2015 PCP: Tula Nakayama  Brief narrative: Cathy Jones is a 55 y.o. female with a history hypertension, of cervical cancer, colon stricture status post partial colon resection and diverting ileostomy on 2/15 came to the ER when referred by Dr. Leighton Ruff for abnormal lab work. She had acute renal failure with a unit 100 and creatinine of 5.82. 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 The patient stated that she had increased output from her ileostomy and also admitted to not taking her Imodium at home recently. Apparently she was vomiting for a number of days as well.   Subjective: She states she barely drink any liquids yesterday but still had a good amount of output from her ileostomy. No complaint of dizziness, nausea, vomiting abdominal pain. No urinary problems.  Assessment/Plan: Principal Problem:   Acute renal failure/metabolic acidosis/hyperkalemia and hyponatremia on admission -Renal ultrasound negative -Due to vomiting, high output ileostomy in setting of olmesartan use-resolved with IV fluids and holding Olmesartan -Continue IV fluids for now as she still has significant output through her ileostomy- she had 480 mL of fluids in an more than twice as much out -Still slightly acidotic-continue to follow  Active Problems:  Leukocytosis on admission -Stress response?-Related to vomiting?-Has resolved  Anemia- [id number] and Folic acid deficiency -1 g drop noted between 3/22 and 3723-no acute blood loss -Panel reveals deficiency of folate and low normal B-12 which I will start replacing via IV  Vomiting -Has resolved-transitioned to solid food which she is tolerating  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 Marcello Moores spoke with Dr. Donnetta Hutching about this and will have the patient follow-up with him    S/P ileostomy - partial resection of colon -High output ileostomy-supposed to be on Imodium as outpatient which was resumed - due to continued high output, surgery adding Lomotil as she is still putting out > 1 L / day - per Dr Manon Hilding note , She will need to measure her output at home and call the office if this is >1L. -Small fluid collection in her pelvis-Surgery following an suspects it may be a "contained leak" and would like to monitor it for now without intervention  Hypotensive with history of hypertension -Hold amlodipine and Olmesartan  Anxiety -On Ativan and Zoloft in the a.m.    Antibiotics: Anti-infectives    None     Code Status:     Code Status Orders        Start     Ordered   07/31/15 0204  Full code   Continuous     07/31/15 0203    Code Status History    Date Active Date Inactive Code Status Order ID Comments User Context   This patient has a current code status but no historical code status.     Family Communication:  Disposition Plan: Home in 1-2 days DVT prophylaxis: Eliquis Consultants: Gen. Surgery Procedures:     Objective: Filed Weights   07/30/15 2055 07/31/15 1445 08/01/15 1421  Weight: 85.276 kg (188 lb) 85.276 kg (188 lb) 87.726 kg (193 lb 6.4 oz)    Intake/Output Summary (Last 24 hours) at 08/02/15 1333 Last data filed  at 08/02/15 0626  Gross per 24 hour  Intake 3190.4 ml  Output   3800 ml  Net -609.6 ml     Vitals Filed Vitals:   08/01/15 1421 08/01/15 2109 08/02/15 0553 08/02/15 0618  BP: 84/25 101/43 103/36 133/43  Pulse: 70 73 55 69  Temp: 98.5 F (36.9 C) 98.1 F (36.7 C) 98.3 F (36.8 C)   TempSrc: Oral Oral Oral   Resp: 16 16 16    Height:      Weight: 87.726 kg (193 lb 6.4 oz)     SpO2: 96%  93% 100%     Exam:  General:  Pt is alert, not in acute distress  HEENT: No icterus, No thrush, oral mucosa moist  Cardiovascular: regular rate and rhythm, S1/S2 No murmur  Respiratory: clear to auscultation bilaterally   Abdomen: Soft, +Bowel sounds, non tender, non distended, no guarding- ileostomy with liquid stool  MSK: No cyanosis or clubbing- no pedal edema   Data Reviewed: Basic Metabolic Panel:  Recent Labs Lab 07/30/15 2111 07/31/15 0856 08/01/15 0221 08/02/15 0451  NA 130* 136 138 138  K 6.0* 4.9 4.8 4.7  CL 96* 104 112* 114*  CO2 12* 17* 19* 19*  GLUCOSE 157* 104* 105* 96  BUN 100* 89* 53* 20  CREATININE 5.82* 3.12* 1.32* 0.70  CALCIUM 10.0 9.3 8.8* 8.6*   Liver Function Tests:  Recent Labs Lab 07/30/15 2111  AST 9*  ALT 9*  ALKPHOS 178*  BILITOT 0.6  PROT 8.8*  ALBUMIN 4.8   No results for input(s): LIPASE, AMYLASE in the last 168 hours. No results for input(s): AMMONIA in the last 168 hours. CBC:  Recent Labs Lab 07/30/15 2111 07/31/15 0426 08/01/15 0221 08/02/15 0451 08/02/15 0813  WBC 18.6* 11.8* 8.0 8.1 7.8  NEUTROABS 15.8* 8.8*  --   --   --   HGB 12.3 11.0* 10.0* 8.6* 9.2*  HCT 37.5 33.5* 30.9* 26.2* 28.8*  MCV 81.3 81.5 83.5 81.6 85.5  PLT 349 251 221 193 193   Cardiac Enzymes: No results for input(s): CKTOTAL, CKMB, CKMBINDEX, TROPONINI in the last 168 hours. BNP (last 3 results) No results for input(s): BNP in the last 8760 hours.  ProBNP (last 3 results) No results for input(s): PROBNP in the last 8760 hours.  CBG: No results for input(s): GLUCAP in the last 168 hours.  No results found for this or any previous visit (from the past 240 hour(s)).   Studies: No results found.  Scheduled Meds:  Scheduled Meds: . apixaban  10 mg Oral BID   Followed by  . [START ON 08/08/2015] apixaban  5 mg Oral BID  . atorvastatin  10 mg Oral QPC supper  . cyanocobalamin  1,000 mcg Subcutaneous Daily  . diphenoxylate-atropine   1 tablet Oral TID AC & HS  . folic acid  1 mg Intravenous Daily  . loperamide  4 mg Oral TID AC & HS  . LORazepam  0.5 mg Oral q morning - 10a  . nystatin   Topical TID  . pantoprazole (PROTONIX) IV  40 mg Intravenous QHS  . sertraline  100 mg Oral q morning - 10a   Continuous Infusions: . sodium chloride 100 mL/hr at 08/02/15 1104    Time spent on care of this patient: 35 min   [place], MD 08/02/2015, 1:33 PM  LOS: 3 days   [facility] Office  [phone] Pager - Text Page per [email or web address] If 7PM-7AM, please contact night-coverage [email or web address]

## 2015-08-03 DIAGNOSIS — R198 Other specified symptoms and signs involving the digestive system and abdomen: Secondary | ICD-10-CM

## 2015-08-03 DIAGNOSIS — D519 Vitamin B12 deficiency anemia, unspecified: Secondary | ICD-10-CM

## 2015-08-03 LAB — CBC
HEMATOCRIT: 26.3 % — AB (ref 36.0–46.0)
Hemoglobin: 8.6 g/dL — ABNORMAL LOW (ref 12.0–15.0)
MCH: 26.9 pg (ref 26.0–34.0)
MCHC: 32.7 g/dL (ref 30.0–36.0)
MCV: 82.2 fL (ref 78.0–100.0)
Platelets: 181 10*3/uL (ref 150–400)
RBC: 3.2 MIL/uL — ABNORMAL LOW (ref 3.87–5.11)
RDW: 14.4 % (ref 11.5–15.5)
WBC: 10.6 10*3/uL — AB (ref 4.0–10.5)

## 2015-08-03 LAB — BASIC METABOLIC PANEL WITH GFR
Anion gap: 7 (ref 5–15)
BUN: 10 mg/dL (ref 6–20)
CO2: 18 mmol/L — ABNORMAL LOW (ref 22–32)
Calcium: 8.6 mg/dL — ABNORMAL LOW (ref 8.9–10.3)
Chloride: 114 mmol/L — ABNORMAL HIGH (ref 101–111)
Creatinine, Ser: 0.68 mg/dL (ref 0.44–1.00)
GFR calc Af Amer: >60 mL/min
GFR calc non Af Amer: >60 mL/min
Glucose, Bld: 99 mg/dL (ref 65–99)
Potassium: 4.5 mmol/L (ref 3.5–5.1)
Sodium: 139 mmol/L (ref 135–145)

## 2015-08-03 MED ORDER — PANTOPRAZOLE SODIUM 40 MG PO TBEC
40.0000 mg | DELAYED_RELEASE_TABLET | Freq: Every day | ORAL | Status: DC
  Administered 2015-08-03 – 2015-08-04 (×2): 40 mg via ORAL
  Filled 2015-08-03 (×2): qty 1

## 2015-08-03 MED ORDER — DIPHENOXYLATE-ATROPINE 2.5-0.025 MG PO TABS
2.0000 | ORAL_TABLET | Freq: Three times a day (TID) | ORAL | Status: DC
Start: 2015-08-03 — End: 2015-08-04
  Administered 2015-08-03 – 2015-08-04 (×4): 2 via ORAL
  Filled 2015-08-03 (×4): qty 2

## 2015-08-03 MED ORDER — FOLIC ACID 1 MG PO TABS
1.0000 mg | ORAL_TABLET | Freq: Every day | ORAL | Status: DC
  Administered 2015-08-03 – 2015-08-04 (×2): 1 mg via ORAL
  Filled 2015-08-03 (×2): qty 1

## 2015-08-03 MED ORDER — CALCIUM POLYCARBOPHIL 625 MG PO TABS
625.0000 mg | ORAL_TABLET | Freq: Two times a day (BID) | ORAL | Status: DC
  Administered 2015-08-03 – 2015-08-04 (×3): 625 mg via ORAL
  Filled 2015-08-03 (×4): qty 1

## 2015-08-03 NOTE — Progress Notes (Signed)
[facility] Progress Note   Cathy Jones  [id number]  DOB: [birth year]  DOA: 07/30/2015 PCP: Tula Nakayama  Brief narrative: Cathy Jones is a 55 y.o. female with a history hypertension, of cervical cancer, colon stricture status post partial colon resection and diverting ileostomy on 2/15 came to the ER when referred by Dr. Leighton Ruff for abnormal lab work. She had acute renal failure with a unit 100 and creatinine of 5.82. 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 The patient stated that she had increased output from her ileostomy and also admitted to not taking her Imodium at home recently. Apparently she was vomiting for a number of days as well.   Subjective: Drank a lot of liquids yesterday- No complaint of dizziness, nausea, vomiting abdominal pain. No urinary problems.  Assessment/Plan: Principal Problem:   Acute renal failure/metabolic acidosis/hyperkalemia and hyponatremia on admission -Renal ultrasound negative -Due to vomiting, high output ileostomy in setting of olmesartan use-resolved with IV fluids and holding Olmesartan -Still slightly acidotic probably from high stool output-continue to follow - voiding > 3 L - stop IVF  Active Problems:    S/P ileostomy - partial resection of colon -High output ileostomy-supposed to be on Imodium as outpatient which was resumed - per Dr Manon Hilding note , She will need to measure her output at home and call the office if this is >1L. -Small fluid collection in her pelvis-Surgery following an suspects it may be a "contained leak" and would like to monitor it for now without intervention - advised RNs to follow I and O carefully - Dr Marcello Moores managing high output ileostomy  Leukocytosis on admission -Stress response?-Related to vomiting?-Has resolved  Anemia- [id number] and Folic acid deficiency -1 g drop noted between 3/22 and  3723-no acute blood loss -Anemia Panel reveals deficiency of folate and low normal B-12 which I have started replacing via IV  Vomiting -Has resolved-transitioned to solid food which she is tolerating  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 Marcello Moores spoke with Dr. Donnetta Hutching about this and will have the patient follow-up with him  Hypotensive with history of hypertension -Hold amlodipine and Olmesartan  Anxiety -On Ativan and Zoloft in the a.m.    Antibiotics: Anti-infectives    None     Code Status:     Code Status Orders        Start     Ordered   07/31/15 0204  Full code   Continuous     07/31/15 0203    Code Status History    Date Active Date Inactive Code Status Order ID Comments User Context   This patient has a current code status but no historical code status.     Family Communication:  Disposition Plan: Home in 1-2 days DVT prophylaxis: Eliquis Consultants: Gen. Surgery Procedures:     Objective: Filed Weights   07/30/15 2055 07/31/15 1445 08/01/15 1421  Weight: 85.276 kg (188 lb) 85.276 kg (188 lb) 87.726 kg (193 lb 6.4 oz)    Intake/Output Summary (Last 24 hours) at 08/03/15 0943 Last data filed at 08/03/15 0837  Gross per 24 hour  Intake 2936.67 ml  Output   5150 ml  Net -2213.33 ml     Vitals Filed Vitals:  08/02/15 0618 08/02/15 1422 08/02/15 2159 08/03/15 0653  BP: 133/43 108/45 106/61 117/55  Pulse: 69 70 78 73  Temp:  98 F (36.7 C) 98 F (36.7 C) 97.9 F (36.6 C)  TempSrc:  Oral Oral Oral  Resp:  18 16 16   Height:      Weight:      SpO2:  100% 98% 98%    Exam:  General:  Pt is alert, not in acute distress  HEENT: No icterus, No thrush, oral mucosa moist  Cardiovascular: regular rate and rhythm, S1/S2 No  murmur  Respiratory: clear to auscultation bilaterally   Abdomen: Soft, +Bowel sounds, non tender, non distended, no guarding- ileostomy with liquid stool  MSK: No cyanosis or clubbing- no pedal edema   Data Reviewed: Basic Metabolic Panel:  Recent Labs Lab 07/30/15 2111 07/31/15 0856 08/01/15 0221 08/02/15 0451 08/03/15 0449  NA 130* 136 138 138 139  K 6.0* 4.9 4.8 4.7 4.5  CL 96* 104 112* 114* 114*  CO2 12* 17* 19* 19* 18*  GLUCOSE 157* 104* 105* 96 99  BUN 100* 89* 53* 20 10  CREATININE 5.82* 3.12* 1.32* 0.70 0.68  CALCIUM 10.0 9.3 8.8* 8.6* 8.6*   Liver Function Tests:  Recent Labs Lab 07/30/15 2111  AST 9*  ALT 9*  ALKPHOS 178*  BILITOT 0.6  PROT 8.8*  ALBUMIN 4.8   No results for input(s): LIPASE, AMYLASE in the last 168 hours. No results for input(s): AMMONIA in the last 168 hours. CBC:  Recent Labs Lab 07/30/15 2111 07/31/15 0426 08/01/15 0221 08/02/15 0451 08/02/15 0813 08/03/15 0449  WBC 18.6* 11.8* 8.0 8.1 7.8 10.6*  NEUTROABS 15.8* 8.8*  --   --   --   --   HGB 12.3 11.0* 10.0* 8.6* 9.2* 8.6*  HCT 37.5 33.5* 30.9* 26.2* 28.8* 26.3*  MCV 81.3 81.5 83.5 81.6 85.5 82.2  PLT 349 251 221 193 193 181   Cardiac Enzymes: No results for input(s): CKTOTAL, CKMB, CKMBINDEX, TROPONINI in the last 168 hours. BNP (last 3 results) No results for input(s): BNP in the last 8760 hours.  ProBNP (last 3 results) No results for input(s): PROBNP in the last 8760 hours.  CBG: No results for input(s): GLUCAP in the last 168 hours.  No results found for this or any previous visit (from the past 240 hour(s)).   Studies: No results found.  Scheduled Meds:  Scheduled Meds: . apixaban  10 mg Oral BID   Followed by  . [START ON 08/08/2015] apixaban  5 mg Oral BID  . atorvastatin  10 mg Oral QPC supper  . cyanocobalamin  1,000 mcg Subcutaneous Daily  . diphenoxylate-atropine  2 tablet Oral TID AC & HS  . folic acid  1 mg Intravenous Daily  . loperamide   4 mg Oral TID AC & HS  . LORazepam  0.5 mg Oral q morning - 10a  . nystatin   Topical TID  . pantoprazole (PROTONIX) IV  40 mg Intravenous QHS  . polycarbophil  625 mg Oral BID  . sertraline  100 mg Oral q morning - 10a   Continuous Infusions:    Time spent on care of this patient: 35 min   [place], MD 08/03/2015, 9:43 AM  LOS: 4 days   [facility] Office  [phone] Pager - Text Page per [email or web address] If 7PM-7AM, please contact night-coverage [email or web address]

## 2015-08-03 NOTE — Progress Notes (Signed)
Acute renal failure (ARF) (HCC)  Subjective: Pt feeling better. Ostomy recorded at 1125ml yesterday.  Objective: Vital signs in last 24 hours: Temp:  [97.9 F (36.6 C)-98 F (36.7 C)] 97.9 F (36.6 C) (03/24 0653) Pulse Rate:  [70-78] 73 (03/24 0653) Resp:  [16-18] 16 (03/24 0653) BP: (106-117)/(45-61) 117/55 mmHg (03/24 0653) SpO2:  [98 %-100 %] 98 % (03/24 0653) Last BM Date: 08/02/15  Intake/Output from previous day: 03/23 0701 - 03/24 0700 In: 2456.7 [I.V.:2456.7] Out: 4350 [Urine:3250; Stool:1100] Intake/Output this shift:    General appearance: alert and cooperative GI: soft Ostomy output still very thin Lab Results:  Results for orders placed or performed during the hospital encounter of 07/30/15 (from the past 24 hour(s))  Vitamin B12     Status: None   Collection Time: 08/02/15  8:08 AM  Result Value Ref Range   Vitamin B-12 232 180 - 914 pg/mL  Folate     Status: Abnormal   Collection Time: 08/02/15  8:08 AM  Result Value Ref Range   Folate 3.6 (L) >5.9 ng/mL  Iron and TIBC     Status: Abnormal   Collection Time: 08/02/15  8:08 AM  Result Value Ref Range   Iron 32 28 - 170 ug/dL   TIBC 206 (L) 250 - 450 ug/dL   Saturation Ratios 16 10.4 - 31.8 %   UIBC 174 ug/dL  Ferritin     Status: None   Collection Time: 08/02/15  8:08 AM  Result Value Ref Range   Ferritin 249 11 - 307 ng/mL  CBC     Status: Abnormal   Collection Time: 08/02/15  8:13 AM  Result Value Ref Range   WBC 7.8 4.0 - 10.5 K/uL   RBC 3.37 (L) 3.87 - 5.11 MIL/uL   Hemoglobin 9.2 (L) 12.0 - 15.0 g/dL   HCT 28.8 (L) 36.0 - 46.0 %   MCV 85.5 78.0 - 100.0 fL   MCH 27.3 26.0 - 34.0 pg   MCHC 31.9 30.0 - 36.0 g/dL   RDW 14.8 11.5 - 15.5 %   Platelets 193 150 - 400 K/uL  Reticulocytes     Status: Abnormal   Collection Time: 08/02/15  8:13 AM  Result Value Ref Range   Retic Ct Pct 1.3 0.4 - 3.1 %   RBC. 3.37 (L) 3.87 - 5.11 MIL/uL   Retic Count, Manual 43.8 19.0 - 186.0 K/uL  Basic  metabolic panel     Status: Abnormal   Collection Time: 08/03/15  4:49 AM  Result Value Ref Range   Sodium 139 135 - 145 mmol/L   Potassium 4.5 3.5 - 5.1 mmol/L   Chloride 114 (H) 101 - 111 mmol/L   CO2 18 (L) 22 - 32 mmol/L   Glucose, Bld 99 65 - 99 mg/dL   BUN 10 6 - 20 mg/dL   Creatinine, Ser 0.68 0.44 - 1.00 mg/dL   Calcium 8.6 (L) 8.9 - 10.3 mg/dL   GFR calc non Af Amer >60 >60 mL/min   GFR calc Af Amer >60 >60 mL/min   Anion gap 7 5 - 15  CBC     Status: Abnormal   Collection Time: 08/03/15  4:49 AM  Result Value Ref Range   WBC 10.6 (H) 4.0 - 10.5 K/uL   RBC 3.20 (L) 3.87 - 5.11 MIL/uL   Hemoglobin 8.6 (L) 12.0 - 15.0 g/dL   HCT 26.3 (L) 36.0 - 46.0 %   MCV 82.2 78.0 - 100.0 fL  MCH 26.9 26.0 - 34.0 pg   MCHC 32.7 30.0 - 36.0 g/dL   RDW 14.4 11.5 - 15.5 %   Platelets 181 150 - 400 K/uL     Studies/Results Radiology     MEDS, Scheduled . apixaban  10 mg Oral BID   Followed by  . [START ON 08/08/2015] apixaban  5 mg Oral BID  . atorvastatin  10 mg Oral QPC supper  . cyanocobalamin  1,000 mcg Subcutaneous Daily  . diphenoxylate-atropine  2 tablet Oral TID AC & HS  . folic acid  1 mg Intravenous Daily  . loperamide  4 mg Oral TID AC & HS  . LORazepam  0.5 mg Oral q morning - 10a  . nystatin   Topical TID  . pantoprazole (PROTONIX) IV  40 mg Intravenous QHS  . polycarbophil  625 mg Oral BID  . sertraline  100 mg Oral q morning - 10a     Assessment: Acute renal failure (ARF) ([place])  Plan: I will cont scheduled imodium to help limit her ostomy output and increase to full dose Lomotil today.  I have also added a fiber pill to help bulk her stools.    She will need to measure her output at home and call the office if this is >1L.  Recommend strict I&O's to follow ostomy output while in the hospital.  Will follow with you.  Anticoagulation started for aortic clot.  This was discussed with Dr Donnetta Hutching.  We will have her f/u with them as an outpatient.    She does  have a small fluid collection in her pelvis.  This may be a contained leak.  She is diverted, so I think it is ok to watch this for now.      LOS: 4 days    Rosario Adie, MD [facility], [place] [phone]   08/03/2015 7:39 AM

## 2015-08-04 DIAGNOSIS — E871 Hypo-osmolality and hyponatremia: Secondary | ICD-10-CM

## 2015-08-04 DIAGNOSIS — E875 Hyperkalemia: Secondary | ICD-10-CM

## 2015-08-04 DIAGNOSIS — D529 Folate deficiency anemia, unspecified: Secondary | ICD-10-CM

## 2015-08-04 DIAGNOSIS — E785 Hyperlipidemia, unspecified: Secondary | ICD-10-CM

## 2015-08-04 DIAGNOSIS — F329 Major depressive disorder, single episode, unspecified: Secondary | ICD-10-CM

## 2015-08-04 DIAGNOSIS — R198 Other specified symptoms and signs involving the digestive system and abdomen: Secondary | ICD-10-CM

## 2015-08-04 DIAGNOSIS — Z932 Ileostomy status: Secondary | ICD-10-CM

## 2015-08-04 MED ORDER — VITAMIN C 500 MG PO TABS: 500.0000 mg | ORAL_TABLET | Freq: Two times a day (BID) | ORAL | Status: DC

## 2015-08-04 MED ORDER — ALUM & MAG HYDROXIDE-SIMETH 200-200-20 MG/5ML PO SUSP: 30.0000 mL | Freq: Four times a day (QID) | ORAL | Status: DC | PRN

## 2015-08-04 MED ORDER — LOPERAMIDE HCL 2 MG PO CAPS: 4.0000 mg | ORAL_CAPSULE | Freq: Three times a day (TID) | ORAL | Status: DC

## 2015-08-04 MED ORDER — CYANOCOBALAMIN 1000 MCG PO TABS: 100.0000 ug | ORAL_TABLET | Freq: Every day | ORAL | Status: DC

## 2015-08-04 MED ORDER — MAGIC MOUTHWASH
15.0000 mL | Freq: Four times a day (QID) | ORAL | Status: DC | PRN
  Filled 2015-08-04: qty 15

## 2015-08-04 MED ORDER — APIXABAN 5 MG PO TABS: 5.0000 mg | ORAL_TABLET | Freq: Two times a day (BID) | ORAL | Status: DC

## 2015-08-04 MED ORDER — FERROUS SULFATE 325 (65 FE) MG PO TABS: 325.0000 mg | ORAL_TABLET | Freq: Two times a day (BID) | ORAL | Status: DC

## 2015-08-04 MED ORDER — APIXABAN 5 MG PO TABS: 10.0000 mg | ORAL_TABLET | Freq: Two times a day (BID) | ORAL | Status: DC

## 2015-08-04 MED ORDER — LIP MEDEX EX OINT
1.0000 "application " | TOPICAL_OINTMENT | Freq: Two times a day (BID) | CUTANEOUS | Status: DC
  Filled 2015-08-04: qty 7

## 2015-08-04 MED ORDER — DIPHENOXYLATE-ATROPINE 2.5-0.025 MG PO TABS: 2.0000 | ORAL_TABLET | Freq: Three times a day (TID) | ORAL | Status: DC

## 2015-08-04 MED ORDER — CALCIUM POLYCARBOPHIL 625 MG PO TABS: 625.0000 mg | ORAL_TABLET | Freq: Two times a day (BID) | ORAL | Status: DC

## 2015-08-04 MED ORDER — PHENOL 1.4 % MT LIQD: 2.0000 | OROMUCOSAL | Status: DC | PRN

## 2015-08-04 MED ORDER — PANTOPRAZOLE SODIUM 40 MG PO TBEC: 80.0000 mg | DELAYED_RELEASE_TABLET | Freq: Every day | ORAL | Status: DC

## 2015-08-04 MED ORDER — LACTATED RINGERS IV BOLUS (SEPSIS): 1000.0000 mL | Freq: Three times a day (TID) | INTRAVENOUS | Status: DC | PRN

## 2015-08-04 MED ORDER — PAREGORIC 2 MG/5ML PO TINC: 5.0000 mL | Freq: Four times a day (QID) | ORAL | Status: DC | PRN

## 2015-08-04 MED ORDER — FOLIC ACID 1 MG PO TABS: 1.0000 mg | ORAL_TABLET | Freq: Every day | ORAL | Status: DC

## 2015-08-04 MED ORDER — ACETAMINOPHEN 325 MG PO TABS: 325.0000 mg | ORAL_TABLET | Freq: Four times a day (QID) | ORAL | Status: DC | PRN

## 2015-08-04 MED ORDER — NYSTATIN 100000 UNIT/GM EX POWD: CUTANEOUS | Status: DC

## 2015-08-04 MED ORDER — MENTHOL 3 MG MT LOZG: 1.0000 | LOZENGE | OROMUCOSAL | Status: DC | PRN

## 2015-08-04 MED ORDER — PROMETHAZINE HCL 25 MG/ML IJ SOLN: 6.2500 mg | INTRAMUSCULAR | Status: DC | PRN

## 2015-08-04 NOTE — Progress Notes (Signed)
[facility]  [address] [place], [place] [address] Phone: [phone] FAX: [place] [phone] 1960-12-12   Assessment  Problem List:   Principal Problem:   High output ileostomy [facility]) Active Problems:   Rectal stricture s/p LAR resection [id number]   Hypertension   Hyperlipidemia   Depression   Hyponatremia   Hyperkalemia   S/P ileostomy (HCC)   S/P partial resection of colon   Splenic infarct   Abdominal aorta thrombosis (HCC)   AKI (acute kidney injury) ([facility])   B12 deficiency anemia   Folic acid deficiency anemia      * No surgery found *      High output ileostomy slowed - better  Plan:  ANTIDIARRHEAL REGIMEN -continue max loperamide & lomotil -continue fiber -add iron to help constipate as well   -OK off IVF, so can self-hydrate  -follow lytes  Feels better.  Wants to go home.  Dr Wynelle Cleveland feels OK.  Pt has husband & family at home  If still high, consider PICC & IVF infusions 2L qMWF for the next 6 weeks until SB can better adapt & risk of ARF/dehydration wanes.  Hold off for now  Ostomy care/training - Hollister pouch working better for her.  Anticoagulation started for aortic clot per Dr Donnetta Hutching. Vascular surgery f/u as an outpatient.   Pelvic collection postop w/o strong evidence of abscess  -VTE prophylaxis- SCDs, etc  -mobilize as tolerated to help recovery  Adin Hector, M.D., F.A.C.S. Gastrointestinal and Minimally Invasive Surgery [facility] [address], [place], [place] [address] [phone] Main / Paging   08/04/2015  Subjective:  Feels much better Taking ##PO "NO more flu!" Walking Not dizzy  WANTS TO GO HOME  Objective:  Vital signs:  Filed Vitals:   08/03/15 2207 08/04/15 0608 08/04/15 0623 08/04/15 0700  BP: '98/44 85/40 94/60 ' 96/40  Pulse: 100 76  76  Temp: 98.4 F (36.9 C) 98.5 F (36.9 C)  98.8 F  (37.1 C)  TempSrc: Oral Oral  Oral  Resp:      Height:      Weight:      SpO2: 100% 98%  98%    Last BM Date: 08/03/15  Intake/Output   Yesterday:  03/24 0701 - 03/25 0700 In: 3276.7 [P.O.:3000; I.V.:276.7] Out: 5550 [Urine:4650; Stool:900] This shift:  Total I/O In: 762 [P.O.:762] Out: 950 [Urine:800; Stool:150]  Bowel function:  Flatus: Y  BM: THIN/THICK effluent in bag  Drain: n/a  Physical Exam:  General: Pt awake/alert/oriented x4 in no acute distress.  Smiling, chatting on phone Eyes: PERRL, normal EOM.  Sclera clear.  No icterus Neuro: CN II-XII intact w/o focal sensory/motor deficits. Lymph: No head/neck/groin lymphadenopathy Psych:  No delerium/psychosis/paranoia HENT: Normocephalic, Mucus membranes moist.  No thrush Neck: Supple, No tracheal deviation Chest: No chest wall pain w good excursion CV:  Pulses intact.  Regular rhythm MS: Normal AROM mjr joints.  No obvious deformity Abdomen: Soft.  Nondistended.  Nontender.  No evidence of peritonitis.  No incarcerated hernias.  Ostomy bag c/d/i with thin effluent in bag Ext:  SCDs BLE.  No mjr edema.  No cyanosis Skin: No petechiae / purpura  Results:   Labs: Results for orders placed or performed during the hospital encounter of 07/30/15 (from the past 48 hour(s))  Basic metabolic panel     Status: Abnormal   Collection Time: 08/03/15  4:49 AM  Result Value  Ref Range   Sodium 139 135 - 145 mmol/L   Potassium 4.5 3.5 - 5.1 mmol/L   Chloride 114 (H) 101 - 111 mmol/L   CO2 18 (L) 22 - 32 mmol/L   Glucose, Bld 99 65 - 99 mg/dL   BUN 10 6 - 20 mg/dL   Creatinine, Ser 0.68 0.44 - 1.00 mg/dL   Calcium 8.6 (L) 8.9 - 10.3 mg/dL   GFR calc non Af Amer >60 >60 mL/min   GFR calc Af Amer >60 >60 mL/min    Comment: (NOTE) The eGFR has been calculated using the CKD EPI equation. This calculation has not been validated in all clinical situations. eGFR's persistently <60 mL/min signify possible Chronic  Kidney Disease.    Anion gap 7 5 - 15  CBC     Status: Abnormal   Collection Time: 08/03/15  4:49 AM  Result Value Ref Range   WBC 10.6 (H) 4.0 - 10.5 K/uL   RBC 3.20 (L) 3.87 - 5.11 MIL/uL   Hemoglobin 8.6 (L) 12.0 - 15.0 g/dL   HCT 26.3 (L) 36.0 - 46.0 %   MCV 82.2 78.0 - 100.0 fL   MCH 26.9 26.0 - 34.0 pg   MCHC 32.7 30.0 - 36.0 g/dL   RDW 14.4 11.5 - 15.5 %   Platelets 181 150 - 400 K/uL    Imaging / Studies: No results found.  Medications / Allergies: per chart  Antibiotics: Anti-infectives    None        Note: Portions of this report may have been transcribed using voice recognition software. Every effort was made to ensure accuracy; however, inadvertent computerized transcription errors may be present.   Any transcriptional errors that result from this process are unintentional.     Adin Hector, M.D., F.A.C.S. Gastrointestinal and Minimally Invasive Surgery [facility] [address], [place], [place] [address] [phone] Main / Paging   08/04/2015  CARE TEAM:  PCP: Tula Nakayama  Outpatient Care Team: Patient Care Team: Aletha Halim, PA-C as PCP - General (Family Medicine)  Inpatient Treatment Team: Treatment Team: Attending Provider: Debbe Odea, MD; Consulting Physician: Alphonsa Overall, MD; Consulting Physician: Leighton Ruff, MD; Rounding Team: Garner Gavel, MD; Technician: Lucila Maine, NT; Registered Nurse: Ashley Murrain, RN; Technician: Geoffery Spruce, NT; Technician: Marcello Fennel, NT; Registered Nurse: Kerrin Mo, RN; Technician: Mart Piggs, NT; Registered Nurse: Joellen Jersey, RN

## 2015-08-04 NOTE — Discharge Summary (Addendum)
Physician Discharge Summary  Cathy Jones [id number] DOB: 05-18-1960 DOA: 07/30/2015  PCP: Tula Nakayama  Admit date: 07/30/2015 Discharge date: 08/04/2015  Time spent: 55 minutes  Recommendations for Outpatient Follow-up:  1. F/u [id number], folic acid and Hemoglobin 2. F/u renal function and acidosis 3. New start on Eliquis by Dr Marcello Moores- dose needs to be adjusted if renal function worsens  Discharge Condition:stable    Discharge Diagnoses:  Principal Problem:   AKI (acute kidney injury)/ non anion gap metabolic acidosis Active Problems:   High output ileostomy (HCC)   Splenic infarct   Abdominal aorta thrombosis (HCC)   B12 deficiency anemia   Folic acid deficiency anemia Hypotension    Rectal stricture s/p LAR resection [id number]   Hyperlipidemia   Depression   Hyponatremia   Hyperkalemia   S/P ileostomy (HCC)   S/P partial resection of colon   History of present illness:  Cathy Jones is a 55 y.o. female with a history hypertension, of cervical cancer, colon stricture status post partial colon resection and diverting ileostomy on 2/15 came to the ER when referred by Dr. Leighton Ruff for abnormal lab work. She had acute renal failure with BUN100 and creatinine of 5.82. 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 The patient stated that she had increased output from her ileostomy and also admitted to not taking her Imodium at home recently. Apparently she was vomiting for a number of days as well.  Hospital Course:  Principal Problem:  Acute renal failure/non anion gap metabolic acidosis/hyperkalemia and hyponatremia on admission -Renal ultrasound negative -Due to vomiting, high output ileostomy in setting of olmesartan use-resolved with IV fluids and holding Olmesartan - Cr 0.68- see labs below -Still slightly acidotic probably from hyperchloremia, GI losses of Bicarb   Active  Problems:   S/P ileostomy - partial resection of colon -High output ileostomy- Dr Marcello Moores was managing- supposed to be on Imodium as outpatient which was resumed, added Imodium, Fiber con- stool output now 900 (took 3000 in yesterday)- Dr Johney Maine added Paragoric PRN and resumed Iron to help constipate her - per Dr Manon Hilding note , She will need to measure her output at home and call the office if this is >1L. 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 Marcello Moores spoke with Dr. Donnetta Hutching about this and will have the patient follow-up with him  Anemia- [id number] and Folic acid deficiency -1 g drop noted between 3/22 and 3723-no acute blood loss -Anemia Panel reveals deficiency of folate and low normal B-12 which I have started replacing s/c- has received 3 doses of 1000 mcg - she will go home with oral [id number] and folic acid - follow Hb as outpt  Vomiting -Has resolved-transitioned to solid food which she is tolerating  Leukocytosis on admission -Stress response?-Related to vomiting?-Has resolved  Hypotensive with history of hypertension -Hold amlodipine and Olmesartan- SBP in 90s- 100s - does not needs these at this time  Anxiety -On Ativan and Zoloft in the a.m.  Consultations:  Gen surgery  Discharge Exam: Filed Weights   07/30/15 2055 07/31/15 1445 08/01/15 1421  Weight: 85.276  kg (188 lb) 85.276 kg (188 lb) 87.726 kg (193 lb 6.4 oz)   Filed Vitals:   08/04/15 0623 08/04/15 0700  BP: 94/60 96/40  Pulse:  76  Temp:  98.8 F (37.1 C)  Resp:      General: AAO x 3, no distress Cardiovascular: RRR, no murmurs  Respiratory: clear to  auscultation bilaterally GI: soft, non-tender, non-distended, bowel sound positive- ileostomy intact with liquid stool  Discharge Instructions You were cared for by a hospitalist during your hospital stay. If you have any questions about your discharge medications or the care you received while you were in the hospital after you are discharged, you can call the unit and asked to speak with the hospitalist on call if the hospitalist that took care of you is not available. Once you are discharged, your primary care physician will handle any further medical issues. Please note that NO REFILLS for any discharge medications will be authorized once you are discharged, as it is imperative that you return to your primary care physician (or establish a relationship with a primary care physician if you do not have one) for your aftercare needs so that they can reassess your need for medications and monitor your lab values.      Discharge Instructions    Call MD for:  extreme fatigue    Complete by:  As directed      Call MD for:  hives    Complete by:  As directed      Call MD for:  persistant nausea and vomiting    Complete by:  As directed      Call MD for:  redness, tenderness, or signs of infection (pain, swelling, redness, odor or green/yellow discharge around incision site)    Complete by:  As directed      Call MD for:  severe uncontrolled pain    Complete by:  As directed      Call MD for:    Complete by:  As directed   Temperature > 101.24F     Diet - low sodium heart healthy    Complete by:  As directed      Discharge instructions    Complete by:  As directed   Please see discharge instruction sheets.   Also refer to any handouts/printouts that may have been given from the [facility] surgery office (if you visited [place] there before surgery) Please call our office if you have any questions or concerns [phone]     Discharge instructions    Complete by:  As directed   Measure your stool  output every day- call Dr Marcello Moores if > 1 L per day     Discharge wound care:    Complete by:  As directed   If you have closed incisions, shower and bathe over these incisions with soap and water every day.  Remove all surgical dressings on postoperative day #3.  You do not need to replace dressings over the closed incisions unless you feel more comfortable with a Band-Aid covering it.   If you have an open wound that requires packing, please see wound care instructions.  In general, remove all dressings, wash wound with soap and water and then replace with saline moistened gauze.  Do the dressing change at least every day.  Please call our office [phone] if you have further questions.     Driving Restrictions    Complete by:  As directed   No driving until off narcotics and  can safely swerve away without pain during an emergency     Increase activity slowly    Complete by:  As directed   Walk an hour a day.  Use 20-30 minute walks.  When you can walk 30 minutes without difficulty, it is fine to restart low impact/moderate activities such as biking, jogging, swimming, sexual activity, etc.  Eventually you can increase to unrestricted activity when not feeling pain.  If you feel pain: STOP!Marland Kitchen   Let pain protect you from overdoing it.  Use ice/heat & over-the-counter pain medications to help minimize soreness.  If that is not enough, then use your narcotic pain prescription as needed to remain active.  It is better to take extra pain medications and be more active than to stay bedridden to avoid all pain medications.     Increase activity slowly    Complete by:  As directed      Lifting restrictions    Complete by:  As directed   Avoid heavy lifting initially.  Do not push through pain.  You have no specific weight limit - if it hurts to do, DON'T DO IT.   If you feel no pain, you are not injuring anything.  Pain will protect you from injury.  Coughing and sneezing are far more stressful to your  incision than any lifting.  Avoid resuming heavy lifting / intense activity until off all narcotic pain medications.  When ready to exercise more, give yourself 2 weeks to gradually get back to full intense exercise/activity.     May shower / Bathe    Complete by:  As directed      May walk up steps    Complete by:  As directed      Sexual Activity Restrictions    Complete by:  As directed   Sexual activity as tolerated.  Do not push through pain.  Pain will protect you from injury.     Walk with assistance    Complete by:  As directed   Walk over an hour a day.  May use a walker/cane/companion to help with balance and stamina.            Medication List    STOP taking these medications        AZOR 5-20 MG tablet  Generic drug:  amLODipine-olmesartan      TAKE these medications        apixaban 5 MG Tabs tablet  Commonly known as:  ELIQUIS  Take 2 tablets (10 mg total) by mouth 2 (two) times daily.     apixaban 5 MG Tabs tablet  Commonly known as:  ELIQUIS  Take 1 tablet (5 mg total) by mouth 2 (two) times daily.  Start taking on:  08/08/2015     atorvastatin 10 MG tablet  Commonly known as:  LIPITOR  Take 10 mg by mouth every morning.     cyanocobalamin 1000 MCG tablet  Take 0.5 tablets (500 mcg total) by mouth daily.     diphenoxylate-atropine 2.5-0.025 MG tablet  Commonly known as:  LOMOTIL  Take 2 tablets by mouth 4 (four) times daily -  before meals and at bedtime.     ferrous sulfate 325 (65 FE) MG tablet  Take 1 tablet (325 mg total) by mouth 2 (two) times daily with a meal.     folic acid 1 MG tablet  Commonly known as:  FOLVITE  Take 1 tablet (1 mg total) by mouth daily.  loperamide 2 MG capsule  Commonly known as:  IMODIUM  Take 2 capsules (4 mg total) by mouth 4 (four) times daily -  before meals and at bedtime.     LORazepam 0.5 MG tablet  Commonly known as:  ATIVAN  Take 0.5 mg by mouth every morning.     nystatin 100000 UNIT/GM Powd  Apply  under pannus TID     paregoric 2 MG/5ML solution  Take 5-10 mLs by mouth every 6 (six) hours as needed for diarrhea or loose stools (Give if > 359mL q6h).     polycarbophil 625 MG tablet  Commonly known as:  FIBERCON  Take 1 tablet (625 mg total) by mouth 2 (two) times daily.     PROAIR HFA 108 (90 Base) MCG/ACT inhaler  Generic drug:  albuterol  Inhale 1-2 puffs into the lungs every 4 (four) hours as needed for wheezing or shortness of breath.     sertraline 100 MG tablet  Commonly known as:  ZOLOFT  Take one tablet by mouth every morning.       Allergies  Allergen Reactions  . Codeine Other (See Comments)    "hyper"   Follow-up Information    Follow up with Rosario Adie., MD. Schedule an appointment as soon as possible for a visit in 2 weeks.   Specialty:  General Surgery   Why:  To follow up after your operation, To follow up after your hospital stay   Contact information:   [place] [address] [phone]        The results of significant diagnostics from this hospitalization (including imaging, microbiology, ancillary and laboratory) are listed below for reference.    Significant Diagnostic Studies: Ct Abdomen Pelvis W Contrast  07/30/2015  CLINICAL DATA:  55 year old female with nausea and vomiting for 1 week. Unintentional 50 lbs. weight loss. Mid abdominal pain. History of History of cervical cancer status post hysterectomy. More recent history of short-segment sigmoid colon stricture treated surgically. EXAM: CT ABDOMEN AND PELVIS WITH CONTRAST TECHNIQUE: Multidetector CT imaging of the abdomen and pelvis was performed using the standard protocol following bolus administration of intravenous contrast. CONTRAST:  137mL ISOVUE-300 IOPAMIDOL (ISOVUE-300) INJECTION 61% COMPARISON:  CT virtual colonoscopy 07/19/2014. CT Abdomen and Pelvis 06/13/2014. FINDINGS: Lung bases are stable since 2016 and negative. No pericardial or pleural effusion.  Stable mild elevation of the left hemidiaphragm. Stable visualized osseous structures. L3 limbus vertebra. Chronic multilevel vertebral endplate irregularity most pronounced at L4. No acute or suspicious osseous lesion identified. Since March 2016 the patient has undergone ventral abdominal surgery with right abdominal ileostomy. There is new confluent presacral stranding. Chronic retroperitoneal and pelvic sidewall dissection with numerous surgical clips otherwise appears stable. Evidence of a sigmoid colectomy with staple line at what appears to be a sigmoid to rectum anastomosis (series 3, image 69). Along the right pelvic sidewall lateral to the apparent anastomosis there is an 18 x 42 mm fluid collection or less likely residual fluid-filled bowel loop (series 3, image 68). The uterus and adnexa are chronically surgically absent. There is a small volume of gas within the vagina. The urinary bladder is completely decompressed today. The left colon is decompressed. The transverse colon is decompressed. The right colon is decompressed. The distal small bowel is decompressed. The ileostomy has no adverse features there. No dilated small bowel loops. Is mild fluid distension of the stomach no pneumoperitoneum or abdominal free fluid. Liver, gallbladder, pancreas and adrenal glands are  within normal limits. Portal venous system is patent. The spleen is remarkable for a mildly lobulated but wedge-shaped and peripheral hypodense area along the posterior margin which may be new since 2016. No surrounding inflammation. More subtle peripheral wedge-shaped hypodense area also on image 19 along the anterior contour, and image 12 at the superior pole of the spleen. No left upper quadrant fluid. Extensive Aortoiliac calcified atherosclerosis noted. Major arterial structures appear to remain patent. However, there is newly seen low-density bulky mural plaque or thrombus along the posterior wall of the descending thoracic aorta  measuring 6 x 11 x 10 mm (AP by transverse by CC), new since 2016. Stable residual small retroperitoneal lymph nodes. Bilateral renal enhancement is symmetric and within normal limits. However, despite up to 7 minute delayed imaging through the kidneys no renal contrast excretion occurred. However, at the same time there is no hydronephrosis or hydroureter. Both proximal ureters are diminutive. Both track into the postoperative retroperitoneal changes an are poorly delineated distally. There is no perinephric stranding. The interval postoperative changes to the ventral abdominal wall are noted with no adverse features. IMPRESSION: 1. Evidence of multiple small splenic infarcts. Associated new soft mural plaque or thrombus in the descending thoracic aorta (series 3, image 3) suspicious for embolic source. Recommend vascular surgery consultation. 2. Interval postoperative changes to the pelvis, including apparent partial sigmoidectomy and reanastomosis. There is an elongated extraluminal fluid collection beginning posterior to the anastomosis site (in the midline presacral space) and tracking along the posterior right pelvic sidewall. This is of unclear etiology and significance. 3. Suspect acute renal insufficiency on the basis of intrinsic renal disease suspected in light of no contrast excretion to from the kidneys up to 7 minutes following injection. No evidence of obstructive uropathy. 4. New right abdominal ileostomy with no bowel obstruction. The above was discussed by telephone with Dr. Leighton Ruff on [id number] at 15:14 . Electronically Signed   By: Genevie Ann M.D.   On: 07/30/2015 15:15   US Renal  07/31/2015  CLINICAL DATA:  Acute renal failure.  Hypertension. EXAM: RENAL / URINARY TRACT ULTRASOUND COMPLETE COMPARISON:  None. FINDINGS: Right Kidney: Length: 10.1 cm. Echogenicity within normal limits. No mass or hydronephrosis visualized. Left Kidney: Length: 11.1 cm. Echogenicity within normal limits. No  mass or hydronephrosis visualized. Bladder: Appears normal for degree of bladder distention. IMPRESSION: Normal renal ultrasound.  No hydronephrosis. Electronically Signed   By: Suzy Bouchard M.D.   On: 07/31/2015 08:43    Microbiology: No results found for this or any previous visit (from the past 240 hour(s)).   Labs: Basic Metabolic Panel:  Recent Labs Lab 07/30/15 2111 07/31/15 0856 08/01/15 0221 08/02/15 0451 08/03/15 0449  NA 130* 136 138 138 139  K 6.0* 4.9 4.8 4.7 4.5  CL 96* 104 112* 114* 114*  CO2 12* 17* 19* 19* 18*  GLUCOSE 157* 104* 105* 96 99  BUN 100* 89* 53* 20 10  CREATININE 5.82* 3.12* 1.32* 0.70 0.68  CALCIUM 10.0 9.3 8.8* 8.6* 8.6*   Liver Function Tests:  Recent Labs Lab 07/30/15 2111  AST 9*  ALT 9*  ALKPHOS 178*  BILITOT 0.6  PROT 8.8*  ALBUMIN 4.8   No results for input(s): LIPASE, AMYLASE in the last 168 hours. No results for input(s): AMMONIA in the last 168 hours. CBC:  Recent Labs Lab 07/30/15 2111 07/31/15 0426 08/01/15 0221 08/02/15 0451 08/02/15 0813 08/03/15 0449  WBC 18.6* 11.8* 8.0 8.1 7.8 10.6*  NEUTROABS 15.8* 8.8*  --   --   --   --  HGB 12.3 11.0* 10.0* 8.6* 9.2* 8.6*  HCT 37.5 33.5* 30.9* 26.2* 28.8* 26.3*  MCV 81.3 81.5 83.5 81.6 85.5 82.2  PLT 349 251 221 193 193 181   Cardiac Enzymes: No results for input(s): CKTOTAL, CKMB, CKMBINDEX, TROPONINI in the last 168 hours. BNP: BNP (last 3 results) No results for input(s): BNP in the last 8760 hours.  ProBNP (last 3 results) No results for input(s): PROBNP in the last 8760 hours.  CBG: No results for input(s): GLUCAP in the last 168 hours.     SignedDebbe Odea, MD [facility] 08/04/2015, 10:51 AM

## 2015-08-07 ENCOUNTER — Ambulatory Visit (HOSPITAL_COMMUNITY)
Admission: RE | Admit: 2015-08-07 | Discharge: 2015-08-07 | Disposition: A | Discharge status: Home / Self Care | Payer: BLUE CROSS/BLUE SHIELD | Source: Ambulatory Visit | Attending: Family Medicine | Admitting: Family Medicine

## 2015-08-07 ENCOUNTER — Inpatient Hospital Stay (HOSPITAL_COMMUNITY)
Admission: EM | Admit: 2015-08-07 | Discharge: 2015-08-12 | DRG: 271 | Disposition: A | Discharge status: Home / Self Care | Payer: BLUE CROSS/BLUE SHIELD | Attending: Family Medicine | Admitting: Family Medicine

## 2015-08-07 ENCOUNTER — Other Ambulatory Visit (HOSPITAL_COMMUNITY): Payer: Self-pay | Admitting: Family Medicine

## 2015-08-07 ENCOUNTER — Encounter (HOSPITAL_COMMUNITY): Payer: Self-pay | Admitting: Emergency Medicine

## 2015-08-07 DIAGNOSIS — I10 Essential (primary) hypertension: Secondary | ICD-10-CM | POA: Diagnosis present

## 2015-08-07 DIAGNOSIS — N179 Acute kidney failure, unspecified: Secondary | ICD-10-CM | POA: Diagnosis present

## 2015-08-07 DIAGNOSIS — I7409 Other arterial embolism and thrombosis of abdominal aorta: Secondary | ICD-10-CM | POA: Diagnosis not present

## 2015-08-07 DIAGNOSIS — Z8541 Personal history of malignant neoplasm of cervix uteri: Secondary | ICD-10-CM

## 2015-08-07 DIAGNOSIS — F419 Anxiety disorder, unspecified: Secondary | ICD-10-CM | POA: Diagnosis present

## 2015-08-07 DIAGNOSIS — F17200 Nicotine dependence, unspecified, uncomplicated: Secondary | ICD-10-CM | POA: Diagnosis present

## 2015-08-07 DIAGNOSIS — R209 Unspecified disturbances of skin sensation: Secondary | ICD-10-CM

## 2015-08-07 DIAGNOSIS — I739 Peripheral vascular disease, unspecified: Secondary | ICD-10-CM | POA: Diagnosis present

## 2015-08-07 DIAGNOSIS — E871 Hypo-osmolality and hyponatremia: Secondary | ICD-10-CM | POA: Diagnosis present

## 2015-08-07 DIAGNOSIS — Z9221 Personal history of antineoplastic chemotherapy: Secondary | ICD-10-CM | POA: Diagnosis not present

## 2015-08-07 DIAGNOSIS — I745 Embolism and thrombosis of iliac artery: Secondary | ICD-10-CM | POA: Diagnosis present

## 2015-08-07 DIAGNOSIS — I998 Other disorder of circulatory system: Secondary | ICD-10-CM | POA: Diagnosis present

## 2015-08-07 DIAGNOSIS — E785 Hyperlipidemia, unspecified: Secondary | ICD-10-CM | POA: Diagnosis present

## 2015-08-07 DIAGNOSIS — E860 Dehydration: Secondary | ICD-10-CM | POA: Diagnosis present

## 2015-08-07 DIAGNOSIS — Z419 Encounter for procedure for purposes other than remedying health state, unspecified: Secondary | ICD-10-CM

## 2015-08-07 DIAGNOSIS — E872 Acidosis: Secondary | ICD-10-CM | POA: Diagnosis present

## 2015-08-07 DIAGNOSIS — I7410 Embolism and thrombosis of unspecified parts of aorta: Secondary | ICD-10-CM

## 2015-08-07 DIAGNOSIS — Z9889 Other specified postprocedural states: Secondary | ICD-10-CM

## 2015-08-07 DIAGNOSIS — J45909 Unspecified asthma, uncomplicated: Secondary | ICD-10-CM | POA: Diagnosis present

## 2015-08-07 DIAGNOSIS — Z932 Ileostomy status: Secondary | ICD-10-CM

## 2015-08-07 DIAGNOSIS — F32A Depression, unspecified: Secondary | ICD-10-CM | POA: Diagnosis present

## 2015-08-07 DIAGNOSIS — Z923 Personal history of irradiation: Secondary | ICD-10-CM | POA: Diagnosis not present

## 2015-08-07 DIAGNOSIS — D649 Anemia, unspecified: Secondary | ICD-10-CM | POA: Diagnosis present

## 2015-08-07 DIAGNOSIS — D72829 Elevated white blood cell count, unspecified: Secondary | ICD-10-CM

## 2015-08-07 DIAGNOSIS — I749 Embolism and thrombosis of unspecified artery: Secondary | ICD-10-CM | POA: Diagnosis present

## 2015-08-07 DIAGNOSIS — I959 Hypotension, unspecified: Secondary | ICD-10-CM | POA: Diagnosis present

## 2015-08-07 DIAGNOSIS — E7800 Pure hypercholesterolemia, unspecified: Secondary | ICD-10-CM | POA: Diagnosis present

## 2015-08-07 DIAGNOSIS — Z7901 Long term (current) use of anticoagulants: Secondary | ICD-10-CM | POA: Diagnosis not present

## 2015-08-07 DIAGNOSIS — R198 Other specified symptoms and signs involving the digestive system and abdomen: Secondary | ICD-10-CM

## 2015-08-07 DIAGNOSIS — S37009A Unspecified injury of unspecified kidney, initial encounter: Secondary | ICD-10-CM | POA: Insufficient documentation

## 2015-08-07 DIAGNOSIS — D519 Vitamin B12 deficiency anemia, unspecified: Secondary | ICD-10-CM | POA: Diagnosis present

## 2015-08-07 DIAGNOSIS — F329 Major depressive disorder, single episode, unspecified: Secondary | ICD-10-CM | POA: Diagnosis present

## 2015-08-07 DIAGNOSIS — R208 Other disturbances of skin sensation: Secondary | ICD-10-CM | POA: Diagnosis not present

## 2015-08-07 DIAGNOSIS — I7090 Unspecified atherosclerosis: Secondary | ICD-10-CM | POA: Diagnosis present

## 2015-08-07 HISTORY — DX: Anemia, unspecified: D64.9

## 2015-08-07 HISTORY — DX: Embolism and thrombosis of iliac artery: I74.5

## 2015-08-07 HISTORY — DX: Migraine, unspecified, not intractable, without status migrainosus: G43.909

## 2015-08-07 HISTORY — DX: Anxiety disorder, unspecified: F41.9

## 2015-08-07 HISTORY — DX: Gastro-esophageal reflux disease without esophagitis: K21.9

## 2015-08-07 HISTORY — DX: Acute kidney failure, unspecified: N17.9

## 2015-08-07 LAB — URINALYSIS, ROUTINE W REFLEX MICROSCOPIC
Bilirubin Urine: NEGATIVE
Glucose, UA: NEGATIVE mg/dL
Hgb urine dipstick: NEGATIVE
Ketones, ur: NEGATIVE mg/dL
Leukocytes, UA: NEGATIVE
NITRITE: NEGATIVE
PROTEIN: NEGATIVE mg/dL
SPECIFIC GRAVITY, URINE: 1.007 (ref 1.005–1.030)
pH: 5 (ref 5.0–8.0)

## 2015-08-07 LAB — COMPREHENSIVE METABOLIC PANEL
ALBUMIN: 3 g/dL — AB (ref 3.5–5.0)
ALK PHOS: 156 U/L — AB (ref 38–126)
ALT: 10 U/L — ABNORMAL LOW (ref 14–54)
ANION GAP: 13 (ref 5–15)
AST: 14 U/L — ABNORMAL LOW (ref 15–41)
BUN: 17 mg/dL (ref 6–20)
CALCIUM: 9.3 mg/dL (ref 8.9–10.3)
CO2: 21 mmol/L — AB (ref 22–32)
CREATININE: 1.84 mg/dL — AB (ref 0.44–1.00)
Chloride: 99 mmol/L — ABNORMAL LOW (ref 101–111)
GFR calc non Af Amer: 30 mL/min — ABNORMAL LOW (ref >60)
GFR, EST AFRICAN AMERICAN: 35 mL/min — AB (ref >60)
GLUCOSE: 115 mg/dL — AB (ref 65–99)
Potassium: 4.6 mmol/L (ref 3.5–5.1)
SODIUM: 133 mmol/L — AB (ref 135–145)
Total Bilirubin: 0.3 mg/dL (ref 0.3–1.2)
Total Protein: 6.3 g/dL — ABNORMAL LOW (ref 6.5–8.1)

## 2015-08-07 LAB — HEPARIN LEVEL (UNFRACTIONATED)

## 2015-08-07 LAB — CBC WITH DIFFERENTIAL/PLATELET
BASOS PCT: 0 %
Basophils Absolute: 0 10*3/uL (ref 0.0–0.1)
Eosinophils Absolute: 0.1 10*3/uL (ref 0.0–0.7)
Eosinophils Relative: 1 %
HEMATOCRIT: 30.5 % — AB (ref 36.0–46.0)
Hemoglobin: 9.5 g/dL — ABNORMAL LOW (ref 12.0–15.0)
Lymphocytes Relative: 14 %
Lymphs Abs: 2.1 10*3/uL (ref 0.7–4.0)
MCH: 26 pg (ref 26.0–34.0)
MCHC: 31.1 g/dL (ref 30.0–36.0)
MCV: 83.3 fL (ref 78.0–100.0)
MONO ABS: 1.5 10*3/uL — AB (ref 0.1–1.0)
MONOS PCT: 10 %
NEUTROS ABS: 11.2 10*3/uL — AB (ref 1.7–7.7)
Neutrophils Relative %: 75 %
Platelets: 283 10*3/uL (ref 150–400)
RBC: 3.66 MIL/uL — ABNORMAL LOW (ref 3.87–5.11)
RDW: 14.4 % (ref 11.5–15.5)
WBC: 14.9 10*3/uL — ABNORMAL HIGH (ref 4.0–10.5)

## 2015-08-07 LAB — APTT: APTT: 45 s — AB (ref 24–37)

## 2015-08-07 LAB — I-STAT CG4 LACTIC ACID, ED: Lactic Acid, Venous: 3.01 mmol/L (ref 0.5–2.0)

## 2015-08-07 LAB — CREATININE, URINE, RANDOM: CREATININE, URINE: 55.13 mg/dL

## 2015-08-07 LAB — SODIUM, URINE, RANDOM

## 2015-08-07 LAB — ANTITHROMBIN III: ANTITHROMB III FUNC: 91 % (ref 75–120)

## 2015-08-07 MED ORDER — SODIUM CHLORIDE 0.9 % IV BOLUS (SEPSIS)
1000.0000 mL | Freq: Once | INTRAVENOUS | Status: AC
  Administered 2015-08-07: 1000 mL via INTRAVENOUS

## 2015-08-07 MED ORDER — HEPARIN (PORCINE) IN NACL 100-0.45 UNIT/ML-% IJ SOLN
1650.0000 [IU]/h | INTRAMUSCULAR | Status: DC
  Administered 2015-08-08: 1500 [IU]/h via INTRAVENOUS
  Administered 2015-08-08: 1300 [IU]/h via INTRAVENOUS
  Filled 2015-08-07 (×5): qty 250

## 2015-08-07 MED ORDER — HEPARIN BOLUS VIA INFUSION
4000.0000 [IU] | Freq: Once | INTRAVENOUS | Status: AC
  Administered 2015-08-08: 4000 [IU] via INTRAVENOUS
  Filled 2015-08-07: qty 4000

## 2015-08-07 NOTE — Progress Notes (Signed)
ANTICOAGULATION CONSULT NOTE - Initial Consult  Pharmacy Consult for Heparin  Indication: Aortic Thrombus   Allergies  Allergen Reactions  . Codeine Other (See Comments)    "hyper"    Patient Measurements:  Vital Signs: Temp: 97.8 F (36.6 C) (03/28 1724) Temp Source: Oral (03/28 1724) BP: 92/47 mmHg (03/28 2015) Pulse Rate: 93 (03/28 2015)  Labs:  Recent Labs  08/07/15 1819  HGB 9.5*  HCT 30.5*  PLT 283  CREATININE 1.84*    Estimated Creatinine Clearance: 36.3 mL/min (by C-G formula based on Cr of 1.84).   Medical History: Past Medical History  Diagnosis Date  . Hypertension   . Hyperlipidemia   . Cervical cancer ([place])   . Asthma     pt brought her proair inhaler 06-06-14  . Colon polyp     hyperplastic  . 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 Colonic obstruction ([place])   . Family history of adverse reaction to anesthesia     pts mother experiences nausea and vomiting   . Shortness of breath dyspnea     seasonal   . History of bronchitis   . Depression   . Headache     migraines  . History of hiatal hernia   . Ear infection     left   . Sinus infection     Assessment: 24 yof admitted 08/07/2015. 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 Pharmacy consulted to dose IV heparin for aortic thrombus. Hgb 9.5, PLT 283, no bleeding noted.    Goal of Therapy:  Goal aPTT 66/102s Heparin level 0.3-0.7 units/ml Monitor platelets by anticoagulation protocol: Yes   Plan:  BL aPTT/HL Heparin 4,000 unit bolus  Heparin 1300 units/hr 8hr aPTT/HL Daily aPTT/H (until correlate)  Monitor for s/s of bleeding   Wendle Kina C. Lennox Grumbles, PharmD Pharmacy Resident  Pager: [phone] 08/07/2015 8:48 PM

## 2015-08-07 NOTE — Progress Notes (Addendum)
*  PRELIMINARY RESULTS* Vascular Ultrasound Lower extremity arterial duplex has been completed.  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 Monophasic flow noted in the left external iliac artery. Flow is not visualized in distal aorta, which may represent occlusion vs partial obstruction. Right lower extremity- No focal area of stenosis or occlusion. Severely dampened monophasic arterial flow. Unable to obtain ABI. Left lower extremity- No focal area of stenosis or occlusion. Monophasic arterial flow. ABI 0.57.   Incidentally- left brachial BP is diminished compared to the right.  ABI RIGHT    LEFT    PRESSURE WAVEFORM  PRESSURE WAVEFORM  BRACHIAL 92 Bi BRACHIAL 70 Mono  DP   DP    AT ---  AT 50 Mono   PT ----  PT 52 Mono   PER   PER    GREAT TOE  NA GREAT TOE  NA    RIGHT LEFT  ABI ---- 0.57     Landry Mellow, RDMS, RVT  08/07/2015, 5:18 PM

## 2015-08-07 NOTE — H&P (Signed)
History and Physical  Patient Name: Cathy Jones     [id number]    DOB: 1960/10/13    DOA: 08/07/2015 Referring physician: Adrian Prows, MD PCP: Tula Nakayama      Chief Complaint: Leg pain and weakness  HPI: Cathy Jones is a 55 y.o. female with a past medical history significant for recent colon resection c/b aortic thrombus who presents with leg pain and weakness.  The patient had a remote cervical cancer with TAH, lymph node resection, and pelvic irradiation over 20 years ago. Last fall, she was found to have a colon stricture on routine colonoscopy. Last month, she underwent planned LAR and ileostomy placement because of this stricture.  Shortly after this surgery, she developed abdominal pain and was referred to get a CT scan of the abdomen and pelvis, which incidentally showed a large bulky plaque versus thrombus in the descending thoracic aorta and splenic infarcts. Labs taken later that night showed creatinine 5.8 mg/dL so she was admitted to the hospital for AKI and aortic thrombus, and started on heparin.  She believes that her AKI was solely due to vomiting and high ostomy output. Indeed, her renal function returned to baseline 0.6 mg/dL with fluids after 5 days. She was discharged on Eliquis for the aortic thrombus/plaque.  The patient presents today with right leg pain. She has been recovering at home after her last hospitalization, with some intermittent right leg pain. Today she got up to go to an appointment, and the leg gave out on her, and felt as if it had "fallen asleep", so she came to the ER.  In the ED, she was hemodynamically stable and had low blood pressure.  Na 133, K 4.6, HCO3 21, Cr 1.8 (from discharge of 0.6 a few days ago), AST/ALT normal, Alkphos 158, WBC 14K, Hgb 9.5 (stable from discharge), and lactate 3.  There is concern for arterial ischemia, and so the case was discussed with her sister surgery, who recommended stopping Eliquis, heparin drip,  fluids, and imaging in the next few days. TRH were asked to admit.     Review of Systems:  Pt complains of right leg pain, improving ostomy output. Pt denies any fever, chills, confusion, passing out, dizziness, weakness.  All other systems negative except as just noted or noted in the history of present illness.  Allergies  Allergen Reactions  . Codeine Other (See Comments)    "hyper"    Prior to Admission medications   Medication Sig Start Date End Date Taking? Authorizing Provider  apixaban (ELIQUIS) 5 MG TABS tablet Take 2 tablets (10 mg total) by mouth 2 (two) times daily. 08/04/15   Debbe Odea, MD  apixaban (ELIQUIS) 5 MG TABS tablet Take 1 tablet (5 mg total) by mouth 2 (two) times daily. 08/08/15   Debbe Odea, MD  atorvastatin (LIPITOR) 10 MG tablet Take 10 mg by mouth every morning.    Historical Provider, MD  diphenoxylate-atropine (LOMOTIL) 2.5-0.025 MG tablet Take 2 tablets by mouth 4 (four) times daily -  before meals and at bedtime. 08/04/15   Michael Boston, MD  ferrous sulfate 325 (65 FE) MG tablet Take 1 tablet (325 mg total) by mouth 2 (two) times daily with a meal. 08/04/15   Michael Boston, MD  folic acid (FOLVITE) 1 MG tablet Take 1 tablet (1 mg total) by mouth daily. 08/04/15   Debbe Odea, MD  loperamide (IMODIUM) 2 MG capsule Take 2 capsules (4 mg total) by mouth 4 (four) times daily -  before meals and at bedtime. 08/04/15   Michael Boston, MD  LORazepam (ATIVAN) 0.5 MG tablet Take 0.5 mg by mouth every morning.    Historical Provider, MD  nystatin (MYCOSTATIN/NYSTOP) 100000 UNIT/GM POWD Apply under pannus TID 08/04/15   Debbe Odea, MD  paregoric 2 MG/5ML solution Take 5-10 mLs by mouth every 6 (six) hours as needed for diarrhea or loose stools (Give if > 327mL q6h). 08/04/15   Debbe Odea, MD  polycarbophil (FIBERCON) 625 MG tablet Take 1 tablet (625 mg total) by mouth 2 (two) times daily. 08/04/15   Michael Boston, MD  PROAIR HFA 108 (90 BASE) MCG/ACT inhaler Inhale 1-2  puffs into the lungs every 4 (four) hours as needed for wheezing or shortness of breath.  05/06/14   Historical Provider, MD  sertraline (ZOLOFT) 100 MG tablet Take one tablet by mouth every morning. 02/19/15   Historical Provider, MD  vitamin B-12 1000 MCG tablet Take 0.5 tablets (500 mcg total) by mouth daily. 08/04/15   Debbe Odea, MD    Past Medical History  Diagnosis Date  . Hypertension   . Hyperlipidemia   . Cervical cancer ([place])   . Asthma     pt brought her proair inhaler 06-06-14  . Colon polyp     hyperplastic  . Colon stricture ([place])   . Colonic obstruction ([place])   . Family history of adverse reaction to anesthesia     pts mother experiences nausea and vomiting   . Shortness of breath dyspnea     seasonal   . History of bronchitis   . Depression   . Headache     migraines  . History of hiatal hernia   . Ear infection     left   . Sinus infection     Past Surgical History  Procedure Laterality Date  . Abdominal hysterectomy  1994  . Colonoscopy    . Upper gastrointestinal endoscopy    . Tubal ligation    . Colon resection N/A 06/27/2015    Procedure: LYSIS OF ADHESIONS LOW ANTERIOR RESECTION DIVERTING LOOP ILEOSTOMY;  Surgeon: Leighton Ruff, MD;  Location: [facility];  Service: General;  Laterality: N/A;  . Diverting ileostomy N/A 06/27/2015    Procedure: DIVERTING LOOP ILEOSTOMY;  Surgeon: Leighton Ruff, MD;  Location: [facility];  Service: General;  Laterality: N/A;  . Cystoscopy with stent placement Bilateral 06/27/2015    Procedure: CYSTOSCOPY WITH CATHETER  PLACEMENT;  Surgeon: Franchot Gallo, MD;  Location: [facility];  Service: Urology;  Laterality: Bilateral;    Family history: family history includes CAD in her father; Berenice Primas' disease in her father; Hyperparathyroidism in her mother; Hypertension in her father and mother; Peripheral vascular disease in her father; Rectal cancer in her cousin. There is no history of Colon cancer, Stomach cancer, Esophageal cancer,  or Deep vein thrombosis.  Social History: Patient lives with her husband. She is a former smoker until 3 months ago. She worked for a [employer] firm, but is not working currently. She is from [place] originally.       Physical Exam: BP 98/56 mmHg  Pulse 84  Temp(Src) 97.8 F (36.6 C) (Oral)  Resp 14  SpO2 98% General appearance: Well-developed, adult female, alert and in acute distress.   Eyes: Anicteric, conjunctiva pink, lids and lashes normal.     ENT: No nasal deformity, discharge, or epistaxis.  OP moist without lesions.   Lymph: No cervical or supraclavicular lymphadenopathy. Skin: Warm and dry.  No jaundice.  No  suspicious rashes or lesions.  No mottling. Cardiac: RRR, nl S1-S2, no murmurs appreciated.  Capillary refill is brisk.  JVP not visible.  No LE edema.  Radial pulses 2+ and symmetric, DP pulses not palpated but lower extremities bilaterally warm and pink on my exam. Respiratory: Normal respiratory rate and rhythm.  CTAB without rales or wheezes. Abdomen: Abdomen soft without rigidity.  No TTP. No ascites, distension.  Ostomy appears normal. MSK: No deformities or effusions. Neuro: Sensorium intact and responding to questions, attention normal.  Speech is fluent.  Moves all extremities equally and with normal coordination.    Psych: Behavior appropriate.  Affect normal.  No evidence of aural or visual hallucinations or delusions.       Labs on Admission:  The metabolic panel shows hyponatremia, mild acidosis, AKI. Mild elevated alkaline phosphatase, transaminases and bilirubin normal. Lactate 3. The complete blood count shows new leukocytosis, stable anemia.     Assessment/Plan 1. AKI:  This is new.  D/c Cr 3 days ago 0.6, at baseline.  Was presumed to be strictly pre-renal azotemia before.  Renal US normal.   -Check urinalysis -Check urine electrolytes -Imaging regarding extent of atherosclerosis/clot burden should include renal arteries -Aggressive IVF -Close  monitoring of renal function   2. Aortic thrombus vs bulky plaque, now with ?RLE arterial insufficiency:  This is new.  The patient developed AKI a month ago abruptly and was found incidentally to have an aortic thrombus.  Not clear to me that this is being considered primarily atherosclerotic or hypercoagulable, but presumably the former.  She has no personal history of VTE or family history of same.  Presumably she is not hypercoagulable, but that her thrombus is related to dehydration last month during her initial event. -Heparin gtt -Stop Eliquis -Consult to Vascular surgery, appreciate recommendations; plans are for small contrast load and vascular imaging in near future -Hypercoagulable panel sent, should be noted that this was ordered while on heparin/Eliquis -Probably CT angiogram in 2-3 days   3. Hypotension:  Mentating well, no shock.  Possibly related to arterial insufficiency in arms.  Previously on Azor, has been stopped. -Fluids and trend BP -Admit to stepdown -Document BP in both arms  4. Depression, anxiety:  -Continue home sertraline and Ativan  5. Ostomy: Output diminishing per patient. -Strict I/Os -Consult [facility]    DVT PPx: Heparin Diet: Heart healthy Consultants: Vascular Code Status: FULL Family Communication: Mother, at bedside  Medical decision making: What exists of the patient's previous chart and CareEverywhere was reviewed in depth and the case was discussed with Dr. Vanita Panda and Trula Slade. Patient seen 9:48 PM on 08/07/2015.  Disposition Plan:  I recommend admission to stepdown.  Clinical condition: stable at present, but BP low and concern for developing limb ischemia, on heparin gtt.  Anticipate imaging when renal function improves, likey no interventions possible until apixaban clears in 48-72 hours.      Edwin Dada [facility] Pager [phone]

## 2015-08-07 NOTE — Progress Notes (Signed)
Discussed vascular lab results with Bing Matter, PA.  Instructed to direct patient to ED for urgent evaluation.   Landry Mellow, RDMS, RVT

## 2015-08-07 NOTE — Consult Note (Signed)
Consult Note  Patient name: Cathy Jones MRN: [id number] DOB: 1961/04/22 Sex: female  Consulting Physician:  ER  Reason for Consult: No chief complaint on file.   HISTORY OF PRESENT ILLNESS: This is a 55 year old female who presented to the ER today with complaints of right leg numbness from the knee down.  She states that this has been present for about 8 days.  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  She was started on anticoagulation and had a follow up with Dr Donnetta Hutching next week.  She had a duplex today which showed an occluded right iliac artery  She has a history of cervical cancer and is s/p XRT and TAH.  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  She was admitted on 07-31-2015 with abdominal pain and nausea and vomiting with acute renal failure llikly from high output ostomy.  She was started on imodium.  She has a history of hypertension, hypercholesterolemia, managed with a statin and asthma  Past Medical History  Diagnosis Date  . Hypertension   . Hyperlipidemia   . Cervical cancer ([place])   . Asthma     pt brought her proair inhaler 06-06-14  . Colon polyp     hyperplastic  . 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 Colonic obstruction ([place])   . Family history of adverse reaction to anesthesia     pts mother experiences nausea and vomiting   . Shortness of breath dyspnea     seasonal   . History of bronchitis   . Depression   . Headache     migraines  . History of hiatal hernia   . Ear infection     left   . Sinus infection     Past Surgical History  Procedure Laterality Date  . Abdominal hysterectomy  1994  . Colonoscopy    . Upper gastrointestinal endoscopy    . Tubal ligation    . Colon resection N/A 06/27/2015    Procedure: LYSIS OF ADHESIONS LOW ANTERIOR RESECTION DIVERTING LOOP  ILEOSTOMY;  Surgeon: Leighton Ruff, MD;  Location: [facility];  Service: General;  Laterality: N/A;  . Diverting ileostomy N/A 06/27/2015    Procedure: DIVERTING LOOP ILEOSTOMY;  Surgeon: Leighton Ruff, MD;  Location: [facility];  Service: General;  Laterality: N/A;  . Cystoscopy with stent placement Bilateral 06/27/2015    Procedure: CYSTOSCOPY WITH CATHETER  PLACEMENT;  Surgeon: Franchot Gallo, MD;  Location: [facility];  Service: Urology;  Laterality: Bilateral;    Social History   Social History  . Marital Status: Married    Spouse Name: N/A  . Number of Children: 1  . Years of Education: N/A   Occupational History  . Not on file.   Social History Main Topics  . Smoking status: Former Smoker -- 0.50 packs/day for 40 years    Types: Cigarettes    Quit date: 06/08/2014  . Smokeless tobacco: Never Used  . Alcohol Use: Yes     Comment: occas  . Drug Use: Yes  . Sexual Activity: Not on file   Other Topics Concern  . Not on file   Social History Narrative    Family History  Problem Relation Age of Onset  . Colon cancer Neg Hx   . Stomach cancer Neg Hx   .  Esophageal cancer Neg Hx   . Rectal cancer Cousin   . Hypertension Mother   . Hypertension Father   . CAD Father     CABG    Allergies as of 08/07/2015 - Review Complete 08/07/2015  Allergen Reaction Noted  . Codeine Other (See Comments) 10/13/2013    No current facility-administered medications on file prior to encounter.   Current Outpatient Prescriptions on File Prior to Encounter  Medication Sig Dispense Refill  . apixaban (ELIQUIS) 5 MG TABS tablet Take 2 tablets (10 mg total) by mouth 2 (two) times daily. 6 tablet 0  . [START ON 08/08/2015] apixaban (ELIQUIS) 5 MG TABS tablet Take 1 tablet (5 mg total) by mouth 2 (two) times daily. 60 tablet 0  . atorvastatin (LIPITOR) 10 MG tablet Take 10 mg by mouth every morning.    . diphenoxylate-atropine (LOMOTIL) 2.5-0.025 MG tablet Take 2 tablets by mouth 4 (four) times  daily -  before meals and at bedtime. 60 tablet 3  . ferrous sulfate 325 (65 FE) MG tablet Take 1 tablet (325 mg total) by mouth 2 (two) times daily with a meal. 60 tablet 1  . folic acid (FOLVITE) 1 MG tablet Take 1 tablet (1 mg total) by mouth daily. 30 tablet 0  . loperamide (IMODIUM) 2 MG capsule Take 2 capsules (4 mg total) by mouth 4 (four) times daily -  before meals and at bedtime. 100 capsule 5  . LORazepam (ATIVAN) 0.5 MG tablet Take 0.5 mg by mouth every morning.    . nystatin (MYCOSTATIN/NYSTOP) 100000 UNIT/GM POWD Apply under pannus TID 30 g 0  . paregoric 2 MG/5ML solution Take 5-10 mLs by mouth every 6 (six) hours as needed for diarrhea or loose stools (Give if > 342mL q6h). 120 mL 0  . polycarbophil (FIBERCON) 625 MG tablet Take 1 tablet (625 mg total) by mouth 2 (two) times daily. 60 tablet 2  . PROAIR HFA 108 (90 BASE) MCG/ACT inhaler Inhale 1-2 puffs into the lungs every 4 (four) hours as needed for wheezing or shortness of breath.     . sertraline (ZOLOFT) 100 MG tablet Take one tablet by mouth every morning.  0  . vitamin B-12 1000 MCG tablet Take 0.5 tablets (500 mcg total) by mouth daily.       REVIEW OF SYSTEMS: See HPI, otherwise, all systems negative  PHYSICAL EXAMINATION: General: The patient appears their stated age.  Vital signs are BP 92/47 mmHg  Pulse 93  Temp(Src) 97.8 F (36.6 C) (Oral)  Resp 17  SpO2 94% Pulmonary: Respirations are non-labored HEENT:  No gross abnormalities Abdomen: Soft and non-tender, ostomy is functioning Musculoskeletal: There are no major deformities.   Neurologic: decreased sensation to the anterior right leg from the knee down.  Sensation is present but diminished on the posterior side of the right leg.  Motor function is intact Skin: There are no ulcer or rashes noted. Psychiatric: The patient has normal affect. Cardiovascular: There is a regular rate and rhythm without significant murmur appreciated.  No palpable right femoral  pulse, palpable left femoral pulse.  No audible right pedal doppler signals, biphasic left DP/PT  Diagnostic Studies: I have reviewed her vascular lab studies as follows: ABI RIGHT    LEFT    PRESSURE WAVEFORM  PRESSURE WAVEFORM  BRACHIAL 92 Bi BRACHIAL 70 Mono  DP   DP    AT ---  AT 50 Mono   PT ----  PT 52 Mono   PER  PER    GREAT TOE  NA GREAT TOE  NA    RIGHT LEFT  ABI ---- 0.57           Assessment:  Right lower extremity ischemia Plan: Currently, the right leg is viable, and therefore, no immediate surgical exploration is required.  I would recommend vascular imaging to better define her anatomy once her creatinine improves and she can tolerate a small dye load.  She will contact me tonight if she experiences worsening symptoms.  I would discontinue her Eliquis and start a heparin drip without bolus  ARF:  Hydrate overnight and monitor ostomy output Hypotension:  Likely related to dehydration secondary to high out put ostomy, hydrate overnight  Dr Donnetta Hutching to follow up in the morning.  Once Eliquis has had time to wear off, and her creatinine improves, she will need better imaging to plan her surgical repair  Recommend hospitalists admission to manage medical issues     V. Leia Alf, M.D. [facility] Office: [phone] Pager:  [phone]

## 2015-08-07 NOTE — ED Notes (Signed)
Pt states "I was hospitalized for renal failure last week and they found the blood clot in my aorta". Pt initial complaint is leg pain. Pt r leg is cold to the touch, cap refill is greater than three seconds. Pt BP on L arm in the 80s, pulse is weak in left arm. Pulse in R foot is also weak. Pt denies CP or SOB. Denies pain in leg, pain is reproducible with ambulation and movement.

## 2015-08-07 NOTE — ED Notes (Signed)
[place], MD at bedside. 

## 2015-08-07 NOTE — ED Provider Notes (Signed)
CSN: [id number]     Arrival date & time 08/07/15  1712 History   First MD Initiated Contact with Patient 08/07/15 1735     No chief complaint on file.   HPI  Patient presents with concern of ongoing pain in the loss of sensation in the right leg. She has a notable history of recently diagnosed aortic thrombosis, as well as acute renal failure. She was hospitalized one week ago. She was discharged from our affiliated facility on Eliquis. She states that since discharge her general symptoms have mostly improved, with minimal nausea, no ongoing generalized weakness. However, the patient has had increasing pain throughout the right leg, sore, severe, worse with ambulation or weightbearing. There is associated loss of sensation throughout the leg. She also complains of cold sensation. Today, the patient had scheduled outpatient follow-up, was sent for ultrasound, and was found to have arterial occlusion.   Past Medical History  Diagnosis Date  . Hypertension   . Hyperlipidemia   . Cervical cancer ([place])   . Asthma     pt brought her proair inhaler 06-06-14  . Colon polyp     hyperplastic  . 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 Colonic obstruction ([place])   . Family history of adverse reaction to anesthesia     pts mother experiences nausea and vomiting   . Shortness of breath dyspnea     seasonal   . History of bronchitis   . Depression   . Headache     migraines  . History of hiatal hernia   . Ear infection     left   . Sinus infection    Past Surgical History  Procedure Laterality Date  . Abdominal hysterectomy  1994  . Colonoscopy    . Upper gastrointestinal endoscopy    . Tubal ligation    . Colon resection N/A 06/27/2015    Procedure: LYSIS OF ADHESIONS LOW ANTERIOR RESECTION DIVERTING LOOP ILEOSTOMY;  Surgeon: Leighton Ruff, MD;  Location: [facility];  Service: General;  Laterality: N/A;  . Diverting ileostomy N/A 06/27/2015    Procedure: DIVERTING LOOP ILEOSTOMY;  Surgeon: Leighton Ruff, MD;  Location: [facility];  Service: General;  Laterality: N/A;  . Cystoscopy with stent placement Bilateral 06/27/2015    Procedure: CYSTOSCOPY WITH CATHETER  PLACEMENT;  Surgeon: Franchot Gallo, MD;  Location: [facility];  Service: Urology;  Laterality: Bilateral;   Family History  Problem Relation Age of Onset  . Colon cancer Neg Hx   . Stomach cancer Neg Hx   . Esophageal cancer Neg Hx   . Rectal cancer Cousin   . Hypertension Mother   . Hypertension Father   . CAD Father     CABG   Social History  Substance Use Topics  . Smoking status: Former Smoker -- 0.50 packs/day for 40 years    Types: Cigarettes    Quit date: 06/08/2014  . Smokeless tobacco: Never Used  . Alcohol Use: Yes     Comment: occas   OB History    No data available     Review of Systems  Constitutional:       Per HPI, otherwise negative  HENT:       Per HPI, otherwise negative  Respiratory:       Per HPI, otherwise negative  Cardiovascular:       Per HPI, otherwise negative  Gastrointestinal: Negative for vomiting.  Endocrine:       Negative aside from HPI  Genitourinary:  Neg aside from HPI   Musculoskeletal:       Per HPI, otherwise negative  Skin: Negative.   Neurological: Positive for weakness and numbness. Negative for syncope.  Hematological:       History of present illness      Allergies  Codeine  Home Medications   Prior to Admission medications   Medication Sig Start Date End Date Taking? Authorizing Provider  apixaban (ELIQUIS) 5 MG TABS tablet Take 2 tablets (10 mg total) by mouth 2 (two) times daily. 08/04/15   Debbe Odea, MD  apixaban (ELIQUIS) 5 MG TABS tablet Take 1 tablet (5 mg total) by mouth 2 (two) times daily. 08/08/15   Debbe Odea, MD  atorvastatin (LIPITOR) 10 MG tablet Take 10 mg by mouth every morning.    Historical Provider, MD  diphenoxylate-atropine (LOMOTIL) 2.5-0.025 MG tablet Take 2 tablets by mouth 4 (four) times daily -  before meals and at  bedtime. 08/04/15   Michael Boston, MD  ferrous sulfate 325 (65 FE) MG tablet Take 1 tablet (325 mg total) by mouth 2 (two) times daily with a meal. 08/04/15   Michael Boston, MD  folic acid (FOLVITE) 1 MG tablet Take 1 tablet (1 mg total) by mouth daily. 08/04/15   Debbe Odea, MD  loperamide (IMODIUM) 2 MG capsule Take 2 capsules (4 mg total) by mouth 4 (four) times daily -  before meals and at bedtime. 08/04/15   Michael Boston, MD  LORazepam (ATIVAN) 0.5 MG tablet Take 0.5 mg by mouth every morning.    Historical Provider, MD  nystatin (MYCOSTATIN/NYSTOP) 100000 UNIT/GM POWD Apply under pannus TID 08/04/15   Debbe Odea, MD  paregoric 2 MG/5ML solution Take 5-10 mLs by mouth every 6 (six) hours as needed for diarrhea or loose stools (Give if > 331mL q6h). 08/04/15   Debbe Odea, MD  polycarbophil (FIBERCON) 625 MG tablet Take 1 tablet (625 mg total) by mouth 2 (two) times daily. 08/04/15   Michael Boston, MD  PROAIR HFA 108 (90 BASE) MCG/ACT inhaler Inhale 1-2 puffs into the lungs every 4 (four) hours as needed for wheezing or shortness of breath.  05/06/14   Historical Provider, MD  sertraline (ZOLOFT) 100 MG tablet Take one tablet by mouth every morning. 02/19/15   Historical Provider, MD  vitamin B-12 1000 MCG tablet Take 0.5 tablets (500 mcg total) by mouth daily. 08/04/15   Debbe Odea, MD   BP 101/46 mmHg  Pulse 82  Temp(Src) 97.8 F (36.6 C) (Oral)  Resp 17  SpO2 98% Physical Exam  Constitutional: She is oriented to person, place, and time. She appears well-developed and well-nourished. No distress.  HENT:  Head: Normocephalic and atraumatic.  Eyes: Conjunctivae and EOM are normal.  Cardiovascular: Normal rate and regular rhythm.   Faint dorsalis pedis pulse appreciable on the right foot, cap refill slow  Pulmonary/Chest: Effort normal and breath sounds normal. No stridor. No respiratory distress.  Abdominal: She exhibits no distension.  Musculoskeletal: She exhibits no edema.   Neurological: She is alert and oriented to person, place, and time. She displays no atrophy. No cranial nerve deficit.  Right leg with globally diminished sensation, patient only appreciates pinpoint sensation on the plantar surface. Patient can move the knee, ankle spontaneously  Skin: Skin is warm and dry.  Psychiatric: She has a normal mood and affect.  Nursing note and vitals reviewed.   ED Course  Procedures (including critical care time) Labs Review Labs Reviewed  COMPREHENSIVE METABOLIC PANEL -  Abnormal; Notable for the following:    Sodium 133 (*)    Chloride 99 (*)    CO2 21 (*)    Glucose, Bld 115 (*)    Creatinine, Ser 1.84 (*)    Total Protein 6.3 (*)    Albumin 3.0 (*)    AST 14 (*)    ALT 10 (*)    Alkaline Phosphatase 156 (*)    GFR calc non Af Amer 30 (*)    GFR calc Af Amer 35 (*)    All other components within normal limits  CBC WITH DIFFERENTIAL/PLATELET - Abnormal; Notable for the following:    WBC 14.9 (*)    RBC 3.66 (*)    Hemoglobin 9.5 (*)    HCT 30.5 (*)    Neutro Abs 11.2 (*)    Monocytes Absolute 1.5 (*)    All other components within normal limits  I-STAT CG4 LACTIC ACID, ED - Abnormal; Notable for the following:    Lactic Acid, Venous 3.01 (*)    All other components within normal limits  APTT  HEPARIN LEVEL (UNFRACTIONATED)  APTT  HEPARIN LEVEL (UNFRACTIONATED)  CBC   Chart review notable for documentation for recent hospitalization for renal failure, aortic thrombosis and today's ultrasound evaluation with no ABI possible on the right leg, with 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  After my initial evaluation I discussed patient's case with our vascular surgeons colleagues.   Imaging Review No results found. I have personally reviewed and evaluated these images and lab results as part of my medical decision-making.  Patient's initial labs notable for persistent renal dysfunction. Patient was hypotensive on  arrival, had fluid resuscitation began empirically. After initial fluid bolus, the patient continues to receive IV fluids.   Update: I have discussed patient's case again with our vascular surgery team.  Given the renal dysfunction and occlusive disease, the patient will be started on heparin, Eliquis will be stopped.  On repeat exam patient is in similar condition, hemodynamically stable, though with mild hypotension.  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 Today, the patient presents with concern of increasing right leg pain, loss of sensation, and cold sensation. Patient was found to have occlusion of the right external iliac artery. Patient is also found to have worsening renal function, lactic acidosis, leukocytosis. With the aforementioned findings, the patient received substantial fluid resuscitation, and was started on a heparin drip. After discussion with our vascular surgery colleagues, the patient was admitted to the step down unit.  CRITICAL CARE Performed by: Carmin Muskrat Total critical care time: 40 minutes Critical care time was exclusive of separately billable procedures and treating other patients. Critical care was necessary to treat or prevent imminent or life-threatening deterioration. Critical care was time spent personally by me on the following activities: development of treatment plan with patient and/or surrogate as well as nursing, discussions with consultants, evaluation of patient's response to treatment, examination of patient, obtaining history from patient or surrogate, ordering and performing treatments and interventions, ordering and review of laboratory studies, ordering and review of radiographic studies, pulse oximetry and re-evaluation of patient's  condition.   Carmin Muskrat, MD 08/07/15 2132

## 2015-08-07 NOTE — ED Notes (Signed)
Vascular at bedside

## 2015-08-08 DIAGNOSIS — I749 Embolism and thrombosis of unspecified artery: Secondary | ICD-10-CM

## 2015-08-08 LAB — HEPARIN LEVEL (UNFRACTIONATED)

## 2015-08-08 LAB — CBC
HEMATOCRIT: 29.4 % — AB (ref 36.0–46.0)
HEMOGLOBIN: 9.3 g/dL — AB (ref 12.0–15.0)
MCH: 26.2 pg (ref 26.0–34.0)
MCHC: 31.6 g/dL (ref 30.0–36.0)
MCV: 82.8 fL (ref 78.0–100.0)
PLATELETS: 266 10*3/uL (ref 150–400)
RBC: 3.55 MIL/uL — ABNORMAL LOW (ref 3.87–5.11)
RDW: 14.4 % (ref 11.5–15.5)
WBC: 9.5 10*3/uL (ref 4.0–10.5)

## 2015-08-08 LAB — BASIC METABOLIC PANEL
Anion gap: 11 (ref 5–15)
BUN: 14 mg/dL (ref 6–20)
CHLORIDE: 107 mmol/L (ref 101–111)
CO2: 19 mmol/L — AB (ref 22–32)
CREATININE: 0.99 mg/dL (ref 0.44–1.00)
Calcium: 8.9 mg/dL (ref 8.9–10.3)
GFR calc Af Amer: >60 mL/min (ref >60)
GFR calc non Af Amer: >60 mL/min (ref >60)
Glucose, Bld: 98 mg/dL (ref 65–99)
POTASSIUM: 4.2 mmol/L (ref 3.5–5.1)
Sodium: 137 mmol/L (ref 135–145)

## 2015-08-08 LAB — APTT
APTT: 52 s — AB (ref 24–37)
aPTT: 54 seconds — ABNORMAL HIGH (ref 24–37)

## 2015-08-08 LAB — LACTIC ACID, PLASMA
Lactic Acid, Venous: 0.6 mmol/L (ref 0.5–2.0)
Lactic Acid, Venous: 1.3 mmol/L (ref 0.5–2.0)

## 2015-08-08 LAB — MRSA PCR SCREENING: MRSA by PCR: NEGATIVE

## 2015-08-08 MED ORDER — SODIUM CHLORIDE 0.9 % IV SOLN
INTRAVENOUS | Status: AC
  Administered 2015-08-08 (×2): via INTRAVENOUS

## 2015-08-08 MED ORDER — LOPERAMIDE HCL 2 MG PO CAPS
4.0000 mg | ORAL_CAPSULE | Freq: Three times a day (TID) | ORAL | Status: DC
  Administered 2015-08-08 – 2015-08-12 (×13): 4 mg via ORAL
  Filled 2015-08-08 (×13): qty 2

## 2015-08-08 MED ORDER — HYDROMORPHONE HCL 1 MG/ML IJ SOLN
0.5000 mg | INTRAMUSCULAR | Status: DC | PRN
  Administered 2015-08-10 – 2015-08-11 (×3): 0.5 mg via INTRAVENOUS
  Filled 2015-08-08 (×3): qty 1

## 2015-08-08 MED ORDER — SERTRALINE HCL 100 MG PO TABS
100.0000 mg | ORAL_TABLET | Freq: Every morning | ORAL | Status: DC
  Administered 2015-08-08 – 2015-08-12 (×4): 100 mg via ORAL
  Filled 2015-08-08 (×2): qty 1
  Filled 2015-08-08: qty 2
  Filled 2015-08-08: qty 1

## 2015-08-08 MED ORDER — ONDANSETRON HCL 4 MG/2ML IJ SOLN: 4.0000 mg | Freq: Four times a day (QID) | INTRAMUSCULAR | Status: DC | PRN

## 2015-08-08 MED ORDER — HYDROCODONE-ACETAMINOPHEN 5-325 MG PO TABS
1.0000 | ORAL_TABLET | ORAL | Status: DC | PRN
  Administered 2015-08-10 – 2015-08-12 (×4): 2 via ORAL
  Filled 2015-08-08 (×4): qty 2

## 2015-08-08 MED ORDER — ONDANSETRON HCL 4 MG PO TABS: 4.0000 mg | ORAL_TABLET | Freq: Four times a day (QID) | ORAL | Status: DC | PRN

## 2015-08-08 MED ORDER — VITAMIN B-12 100 MCG PO TABS
100.0000 ug | ORAL_TABLET | Freq: Every day | ORAL | Status: DC
  Administered 2015-08-08 – 2015-08-12 (×3): 100 ug via ORAL
  Filled 2015-08-08 (×9): qty 1

## 2015-08-08 MED ORDER — PAREGORIC 2 MG/5ML PO TINC
5.0000 mL | Freq: Four times a day (QID) | ORAL | Status: DC | PRN
Start: 2015-08-08 — End: 2015-08-08

## 2015-08-08 MED ORDER — ATORVASTATIN CALCIUM 10 MG PO TABS
10.0000 mg | ORAL_TABLET | Freq: Every morning | ORAL | Status: DC
  Administered 2015-08-08: 10 mg via ORAL
  Filled 2015-08-08: qty 1

## 2015-08-08 MED ORDER — SODIUM CHLORIDE 0.9 % IV SOLN
INTRAVENOUS | Status: DC
  Administered 2015-08-08: 12:00:00 via INTRAVENOUS

## 2015-08-08 MED ORDER — HYDROMORPHONE HCL 1 MG/ML IJ SOLN: 0.5000 mg | INTRAMUSCULAR | Status: DC | PRN

## 2015-08-08 MED ORDER — DIPHENOXYLATE-ATROPINE 2.5-0.025 MG PO TABS
2.0000 | ORAL_TABLET | Freq: Three times a day (TID) | ORAL | Status: DC
  Administered 2015-08-08 – 2015-08-12 (×13): 2 via ORAL
  Filled 2015-08-08 (×13): qty 2

## 2015-08-08 MED ORDER — CALCIUM POLYCARBOPHIL 625 MG PO TABS
625.0000 mg | ORAL_TABLET | Freq: Two times a day (BID) | ORAL | Status: DC
  Administered 2015-08-08 – 2015-08-12 (×7): 625 mg via ORAL
  Filled 2015-08-08 (×13): qty 1

## 2015-08-08 MED ORDER — FOLIC ACID 1 MG PO TABS
1.0000 mg | ORAL_TABLET | Freq: Every day | ORAL | Status: DC
  Administered 2015-08-08 – 2015-08-12 (×3): 1 mg via ORAL
  Filled 2015-08-08 (×3): qty 1

## 2015-08-08 MED ORDER — LORAZEPAM 0.5 MG PO TABS
0.5000 mg | ORAL_TABLET | Freq: Every morning | ORAL | Status: DC
  Administered 2015-08-08 – 2015-08-12 (×4): 0.5 mg via ORAL
  Filled 2015-08-08 (×4): qty 1

## 2015-08-08 MED ORDER — ACETAMINOPHEN 325 MG PO TABS: 650.0000 mg | ORAL_TABLET | Freq: Four times a day (QID) | ORAL | Status: DC | PRN

## 2015-08-08 MED ORDER — FERROUS SULFATE 325 (65 FE) MG PO TABS
325.0000 mg | ORAL_TABLET | Freq: Two times a day (BID) | ORAL | Status: DC
  Administered 2015-08-08 – 2015-08-09 (×3): 325 mg via ORAL
  Filled 2015-08-08 (×3): qty 1

## 2015-08-08 MED ORDER — ACETAMINOPHEN 650 MG RE SUPP: 650.0000 mg | Freq: Four times a day (QID) | RECTAL | Status: DC | PRN

## 2015-08-08 NOTE — Progress Notes (Addendum)
[facility]  Subjective  - Doing OK over all.  Walking around and has decreased pain in the right LE.   Objective 91/51 70 97.8 F (36.6 C) (Oral) 18 100% No intake or output data in the 24 hours ending 08/08/15 0921  Doppler signal left DP/PT, right weak PT signal and peroneal Active range of motion intact and equal bilaterally, sensation intact and grossly equal bilaterally  Vascular Ultrasound Lower extremity arterial duplex has been completed. 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 Monophasic flow noted in the left external iliac artery. Flow is not visualized in distal aorta, which may represent occlusion vs partial obstruction. Right lower extremity- No focal area of stenosis or occlusion. Severely dampened monophasic arterial flow. Unable to obtain ABI. Left lower extremity- No focal area of stenosis or occlusion. Monophasic arterial flow. ABI 0.57. 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  Right iliac occlusion per ultrasound today Currently on IV heparin  hold Eliquis IV fluids for hydration Cr 0.99 down from 1.84 yesterday Plan angiogram for surgical intervention planning.   Laurence Slate [facility] 08/08/2015 9:21 AM --  Laboratory Lab Results:  Recent Labs  08/07/15 1819 08/08/15 0453  WBC 14.9* 9.5  HGB 9.5* 9.3*  HCT 30.5* 29.4*  PLT 283 266   BMET  Recent Labs  08/07/15 1819 08/08/15 0453  NA 133* 137  K 4.6 4.2  CL 99* 107  CO2 21* 19*  GLUCOSE 115* 98  BUN 17 14  CREATININE 1.84* 0.99  CALCIUM 9.3 8.9    COAG Lab Results  Component Value Date   INR 1.20 07/31/2015   No results found for: PTT    I agree with the above.  I have seen and evaluated the patient.  She has developed no interval progression of her ischemic changes.  She continues to have motor function in the right leg with  decreased sensation.  The foot is clearly viable.  Her creatinine has improved with IV hydration.  I will plan for angiography tomorrow via a left groin access, to better define her anatomy and for surgical planning, likely on Friday.  Continue IV heparin and continue to hold Eliquis  [place] Elexis Pollak

## 2015-08-08 NOTE — Progress Notes (Signed)
ANTICOAGULATION CONSULT NOTE - Follow up   Pharmacy Consult for Heparin  Indication: Aortic Thrombus and R lower extremity ischemia   Patient Measurements:  Vital Signs: Temp: 99.1 F (37.3 C) (03/29 1544) Temp Source: Oral (03/29 1544) BP: 110/61 mmHg (03/29 1545) Pulse Rate: 84 (03/29 1544)  Labs:  Recent Labs  08/07/15 1819 08/07/15 2156 08/08/15 0453 08/08/15 0753 08/08/15 1817  HGB 9.5*  --  9.3*  --   --   HCT 30.5*  --  29.4*  --   --   PLT 283  --  266  --   --   APTT  --  45*  --  52* 54*  HEPARINUNFRC  --  >2.20*  --  >2.20*  --   CREATININE 1.84*  --  0.99  --   --     Estimated Creatinine Clearance: 67.4 mL/min (by C-G formula based on Cr of 0.99).   Medical History: Past Medical History  Diagnosis Date  . Hypertension   . Hyperlipidemia   . Asthma     pt brought her proair inhaler 06-06-14  . Colon polyp     hyperplastic  . 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 Colonic obstruction ([place])   . Depression   . History of hiatal hernia   . Family history of adverse reaction to anesthesia     pts mother experiences nausea and vomiting   . Shortness of breath dyspnea     seasonal   . Anemia   . GERD (gastroesophageal reflux disease)   . Migraine     "q couple years maybe" (08/07/2015)  . Anxiety   . Acute renal failure (ARF) ([place]) 08/07/2015  . Cervical cancer ([place])   . 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 Transitioned to hep gtt, renal fxn improved. Vascular planning angiogram on 3/30. - aPTT 52 on 1300 units/hr - aPTT 54 on 1500 units/hr   Goal of Therapy:  Goal aPTT 66/102s Heparin level 0.3-0.7 units/ml Monitor platelets by anticoagulation protocol: Yes   Plan:  1. Increase heparin to 1650 units/hr 2. Plan for follow up aPTT in around 8 hours  3. Daily HL and aPTT until  levels correlate 4. F/u vascular plans, angiogram, sx plans, etc    Thank you for allowing [place] to participate in this patients care. Jens Som, PharmD Pager: [phone]  08/08/2015, 6:47 PM

## 2015-08-08 NOTE — Consult Note (Signed)
[facility] ostomy consult note Consult requested for ostomy assistance.  Pt familiar to [place] team from previous admission for ileostomy.  Stoma type/location: RLQ with pouch intact with good seal Output: Large amt liquid brown stool in pouch  Ostomy pouching: Pt states she is using one piece convex pouch with a barrier ring  She states she is independent with pouch application and emptying and has brought her own supplies from home at the bedside. She denies any problems or need for further assistance. Please re-consult if further assistance is needed.  Thank-you,  Julien Girt MSN, [place], [place], [place], [place]

## 2015-08-08 NOTE — Progress Notes (Signed)
Cathy Jones [id number] DOB: 03-05-1961 DOA: 08/07/2015 PCP: Tula Nakayama  Brief narrative: 55 y/o ? Smoker,  Htn TAH 1994 [Cervical Ca 1994] s/p Chemo-XRT Colon obst c Distal  High-grade sigmoid strict @ 20-25 cm--[attempt by GI @ dilatation 05/2015] Severe P-EM ~ 100 lbs over 2016-2017 2/2 to stricture  3/25-hypertension, of cervical cancer, colon stricture status post partial colon resection and diverting ileostomy on 2/15 came to the ER when referred by Dr. Leighton Ruff for abnormal lab work-At that admission Dr. Diona Fanti Urology  Placed bilateral 6 Fr open-ended catheters Patient underwent on 06/27/15 PELVIC LYSIS OF ADHESIONS, LOW ANTERIOR RESECTION, DIVERTING LOOP ILEOSTOMY CYSTOSCOPY WITH STENT PLACEMENT Re-admit 3/20 witgh AKI, Creat 5.8, non-gap Met acidosis  2/2 to High-output Ileostomy, Desc Thor Aort thrombus + splenic infarcgts-hydrated and sent home  Admitted 08/07/15 R LE pain, found to have ischemia  Chart reviewed as below-   Consultants:  Vascular  Procedures:    Antibiotics:     Subjective   Looks remarkably well Eating a diet at the bedside No lower extremity pain No nausea no vomiting -Had some on 3/28 however No chest pain Tells me that she is currently followed by her PCP for her prior history of cervical carcinoma   Objective    Interim History:   Telemetry:    Objective: Filed Vitals:   08/08/15 0600 08/08/15 0630 08/08/15 0726 08/08/15 0729  BP: _0 84/49  Pulse: 73 67 74   Temp:      TempSrc:      Resp: _1 SpO2: 97% 99% 99%    No intake or output data in the 24 hours ending 08/08/15 0731  Exam:  General: EOMI NCAT pleasant no pallor no icterus Cardiovascular: S1-S2 no murmur rub or gallop Respiratory: Clinically clear no added sound Abdomen: Soft nontender nondistended no rebound no guarding Skin right lower extremity great toe slightly dusky but not painful to touch able to move toes  without pain and I cannot appreciate by direct palpation dorsalis pedis nor posterior tibialis Neuro intact moving all 4 limbs equally power 5/5  Data Reviewed: Basic Metabolic Panel:  Recent Labs Lab 08/02/15 0451 08/03/15 0449 08/07/15 1819 08/08/15 0453  NA 138 139 133* 137  K 4.7 4.5 4.6 4.2  CL 114* 114* 99* 107  CO2 19* 18* 21* 19*  GLUCOSE 96 99 115* 98  BUN _2 CREATININE 0.70 0.68 1.84* 0.99  CALCIUM 8.6* 8.6* 9.3 8.9   Liver Function Tests:  Recent Labs Lab 08/07/15 1819  AST 14*  ALT 10*  ALKPHOS 156*  BILITOT 0.3  PROT 6.3*  ALBUMIN 3.0*   No results for input(s): LIPASE, AMYLASE in the last 168 hours. No results for input(s): AMMONIA in the last 168 hours. CBC:  Recent Labs Lab 08/02/15 0451 08/02/15 0813 08/03/15 0449 08/07/15 1819 08/08/15 0453  WBC 8.1 7.8 10.6* 14.9* 9.5  NEUTROABS  --   --   --  11.2*  --   HGB 8.6* 9.2* 8.6* 9.5* 9.3*  HCT 26.2* 28.8* 26.3* 30.5* 29.4*  MCV 81.6 85.5 82.2 83.3 82.8  PLT 193 193 181 283 266   Cardiac Enzymes: No results for input(s): CKTOTAL, CKMB, CKMBINDEX, TROPONINI in the last 168 hours. BNP: Invalid input(s): POCBNP CBG: No results for input(s): GLUCAP in the last 168 hours.  No results found for this or any previous visit (from the past 240 hour(s)).   Studies:  All Imaging reviewed and is as per above notation   Scheduled Meds: . atorvastatin  10 mg Oral q morning - 10a  . diphenoxylate-atropine  2 tablet Oral TID AC & HS  . ferrous sulfate  325 mg Oral BID WC  . folic acid  1 mg Oral Daily  . loperamide  4 mg Oral TID AC & HS  . LORazepam  0.5 mg Oral q morning - 10a  . polycarbophil  625 mg Oral BID  . sertraline  100 mg Oral q morning - 10a  . vitamin B-12  100 mcg Oral Daily   Continuous Infusions: . sodium chloride 150 mL/hr at 08/08/15 0334  . heparin 1,300 Units/hr (08/08/15 0100)     Assessment/Plan:  1. Ischemic RLE-appreciate VVS input-Noted R  iliac occlusion on US-per VVS angio in next few days-Continue heparin GTT  & transition to NOAC per VVS-given that she is not in pain and her pain has significantly improved, transition Dilaudid every 2--every 4. 2. Recent Colonic obstruct [see above]-high output ostomy.  No strict i/o? Cont LOmotil 2 tab tid AC + HS, Loperamide 4 tid ACHS--if output >input, will ask Ostomy nurse or Gen surg for assist 3. Severe P-EM-see above discussion 4. Hypovolemia + AKI 2/2 to high-output ostomy-slight non gap Met acidosis imporved.  BUN/CREAT 17/1.84-->14/0.99 currently.  Continue Saline @ 150 cc/hr.  Reassess volume in am 5. Bipolar-continue Sertraline 100 q am, Ativan 0.5 q am 6. Prior history of cervical adeno CA status post TAH/BSO 1990s-needs surveillance as an outpatient-unclear underlying etiology for splenic infarct and other issues.    Full code Inpatient Tele  25 min  Verneita Griffes, MD  [facility] Pager [phone] 08/08/2015, 7:31 AM    LOS: 1 day

## 2015-08-08 NOTE — Progress Notes (Signed)
ANTICOAGULATION CONSULT NOTE - Follow up   Pharmacy Consult for Heparin  Indication: Aortic Thrombus and R lower extremity ischemia   Patient Measurements:  Vital Signs: BP: 91/51 mmHg (03/29 0917) Pulse Rate: 70 (03/29 0917)  Labs:  Recent Labs  08/07/15 1819 08/07/15 2156 08/08/15 0453 08/08/15 0753  HGB 9.5*  --  9.3*  --   HCT 30.5*  --  29.4*  --   PLT 283  --  266  --   APTT  --  45*  --  52*  HEPARINUNFRC  --  >2.20*  --  >2.20*  CREATININE 1.84*  --  0.99  --     Estimated Creatinine Clearance: 67.5 mL/min (by C-G formula based on Cr of 0.99).   Medical History: Past Medical History  Diagnosis Date  . Hypertension   . Hyperlipidemia   . Asthma     pt brought her proair inhaler 06-06-14  . Colon polyp     hyperplastic  . 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 Colonic obstruction ([place])   . Depression   . History of hiatal hernia   . Family history of adverse reaction to anesthesia     pts mother experiences nausea and vomiting   . Shortness of breath dyspnea     seasonal   . Anemia   . GERD (gastroesophageal reflux disease)   . Migraine     "q couple years maybe" (08/07/2015)  . Anxiety   . Acute renal failure (ARF) ([place]) 08/07/2015  . Cervical cancer ([place])   . 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 Transitioned to hep gtt, renal fxn improved and aPTT 52 on 1300 units/hr. No sxs of bleeding or issues with heparin per RN. Vascular planning angiogram on 3/30.   Goal of Therapy:  Goal aPTT 66/102s Heparin level 0.3-0.7 units/ml Monitor platelets by anticoagulation protocol: Yes   Plan:  1. Increase heparin to 1500 units/hr 2. Plan for follow up aPTT in around 8 hours  3. Daily HL and aPTT until levels correlate 4. F/u vascular plans, angiogram, sx plans, etc    Vincenza Hews, PharmD,  BCPS 08/08/2015, 10:02 AM Pager: [phone]

## 2015-08-09 ENCOUNTER — Encounter (HOSPITAL_COMMUNITY): Payer: Self-pay | Admitting: Vascular Surgery

## 2015-08-09 ENCOUNTER — Encounter (HOSPITAL_COMMUNITY)
Admission: EM | Disposition: A | Discharge status: Home / Self Care | Payer: Self-pay | Source: Home / Self Care | Attending: Family Medicine

## 2015-08-09 DIAGNOSIS — I745 Embolism and thrombosis of iliac artery: Secondary | ICD-10-CM

## 2015-08-09 HISTORY — PX: PERIPHERAL VASCULAR CATHETERIZATION: SHX172C

## 2015-08-09 LAB — CBC
HCT: 25.4 % — ABNORMAL LOW (ref 36.0–46.0)
Hemoglobin: 7.7 g/dL — ABNORMAL LOW (ref 12.0–15.0)
MCH: 25.4 pg — ABNORMAL LOW (ref 26.0–34.0)
MCHC: 30.3 g/dL (ref 30.0–36.0)
MCV: 83.8 fL (ref 78.0–100.0)
PLATELETS: 255 10*3/uL (ref 150–400)
RBC: 3.03 MIL/uL — AB (ref 3.87–5.11)
RDW: 14.7 % (ref 11.5–15.5)
WBC: 7.4 10*3/uL (ref 4.0–10.5)

## 2015-08-09 LAB — COMPREHENSIVE METABOLIC PANEL
ALBUMIN: 2.4 g/dL — AB (ref 3.5–5.0)
ALT: 9 U/L — AB (ref 14–54)
AST: 10 U/L — AB (ref 15–41)
Alkaline Phosphatase: 124 U/L (ref 38–126)
Anion gap: 7 (ref 5–15)
BUN: 8 mg/dL (ref 6–20)
CHLORIDE: 113 mmol/L — AB (ref 101–111)
CO2: 20 mmol/L — AB (ref 22–32)
CREATININE: 0.71 mg/dL (ref 0.44–1.00)
Calcium: 8.5 mg/dL — ABNORMAL LOW (ref 8.9–10.3)
GFR calc Af Amer: >60 mL/min (ref >60)
GLUCOSE: 98 mg/dL (ref 65–99)
POTASSIUM: 4.1 mmol/L (ref 3.5–5.1)
SODIUM: 140 mmol/L (ref 135–145)
Total Bilirubin: 0.3 mg/dL (ref 0.3–1.2)
Total Protein: 5.2 g/dL — ABNORMAL LOW (ref 6.5–8.1)

## 2015-08-09 LAB — PROTEIN S ACTIVITY: Protein S Activity: 100 % (ref 63–140)

## 2015-08-09 LAB — CARDIOLIPIN ANTIBODIES, IGG, IGM, IGA: Anticardiolipin IgM: <9 MPL U/mL (ref 0–12)

## 2015-08-09 LAB — HOMOCYSTEINE: Homocysteine: 17 umol/L — ABNORMAL HIGH (ref 0.0–15.0)

## 2015-08-09 LAB — BETA-2-GLYCOPROTEIN I ABS, IGG/M/A
Beta-2 Glyco I IgG: <9 GPI IgG units (ref 0–20)
Beta-2-Glycoprotein I IgA: <9 GPI IgA units (ref 0–25)
Beta-2-Glycoprotein I IgM: <9 GPI IgM units (ref 0–32)

## 2015-08-09 LAB — BASIC METABOLIC PANEL
ANION GAP: 7 (ref 5–15)
BUN: 10 mg/dL (ref 6–20)
CO2: 21 mmol/L — AB (ref 22–32)
Calcium: 8.4 mg/dL — ABNORMAL LOW (ref 8.9–10.3)
Chloride: 109 mmol/L (ref 101–111)
Creatinine, Ser: 0.8 mg/dL (ref 0.44–1.00)
GLUCOSE: 125 mg/dL — AB (ref 65–99)
POTASSIUM: 4.2 mmol/L (ref 3.5–5.1)
Sodium: 137 mmol/L (ref 135–145)

## 2015-08-09 LAB — POCT ACTIVATED CLOTTING TIME
Activated Clotting Time: 203 seconds
Activated Clotting Time: 173 seconds

## 2015-08-09 LAB — TYPE AND SCREEN
ABO/RH(D): A POS
Antibody Screen: NEGATIVE

## 2015-08-09 LAB — APTT: APTT: 69 s — AB (ref 24–37)

## 2015-08-09 LAB — PROTIME-INR
INR: 1.25 (ref 0.00–1.49)
Prothrombin Time: 15.9 seconds — ABNORMAL HIGH (ref 11.6–15.2)

## 2015-08-09 LAB — PROTEIN C ACTIVITY: Protein C Activity: 97 % (ref 73–180)

## 2015-08-09 LAB — ABO/RH: ABO/RH(D): A POS

## 2015-08-09 LAB — PROTEIN S, TOTAL: PROTEIN S AG TOTAL: 123 % (ref 60–150)

## 2015-08-09 LAB — PROTEIN C, TOTAL: Protein C, Total: 79 % (ref 60–150)

## 2015-08-09 LAB — HEPARIN LEVEL (UNFRACTIONATED): Heparin Unfractionated: 0.33 IU/mL (ref 0.30–0.70)

## 2015-08-09 SURGERY — ABDOMINAL AORTOGRAM W/LOWER EXTREMITY

## 2015-08-09 MED ORDER — CEFUROXIME SODIUM 1.5 G IJ SOLR
1.5000 g | INTRAMUSCULAR | Status: AC
  Administered 2015-08-10: 1.5 g via INTRAVENOUS
  Filled 2015-08-09: qty 1.5

## 2015-08-09 MED ORDER — IODIXANOL 320 MG/ML IV SOLN
INTRAVENOUS | Status: DC | PRN
  Administered 2015-08-09: 175 mL via INTRAVENOUS

## 2015-08-09 MED ORDER — ONDANSETRON HCL 4 MG/2ML IJ SOLN: 4.0000 mg | Freq: Four times a day (QID) | INTRAMUSCULAR | Status: DC | PRN

## 2015-08-09 MED ORDER — SODIUM CHLORIDE 0.9 % IV SOLN: 1.0000 mL/kg/h | INTRAVENOUS | Status: DC

## 2015-08-09 MED ORDER — LIDOCAINE HCL (PF) 1 % IJ SOLN
INTRAMUSCULAR | Status: DC | PRN
  Administered 2015-08-09: 15 mL via SUBCUTANEOUS

## 2015-08-09 MED ORDER — ACETAMINOPHEN 325 MG PO TABS: 650.0000 mg | ORAL_TABLET | ORAL | Status: DC | PRN

## 2015-08-09 MED ORDER — SODIUM CHLORIDE 0.9 % IV SOLN
Freq: Once | INTRAVENOUS | Status: AC
  Administered 2015-08-09: 18:00:00 via INTRAVENOUS

## 2015-08-09 MED ORDER — FENTANYL CITRATE (PF) 100 MCG/2ML IJ SOLN
INTRAMUSCULAR | Status: AC
  Filled 2015-08-09: qty 2

## 2015-08-09 MED ORDER — HEPARIN SODIUM (PORCINE) 1000 UNIT/ML IJ SOLN
INTRAMUSCULAR | Status: DC | PRN
  Administered 2015-08-09: 8000 [IU] via INTRAVENOUS

## 2015-08-09 MED ORDER — MIDAZOLAM HCL 2 MG/2ML IJ SOLN
INTRAMUSCULAR | Status: AC
  Filled 2015-08-09: qty 2

## 2015-08-09 MED ORDER — HEPARIN SODIUM (PORCINE) 1000 UNIT/ML IJ SOLN
INTRAMUSCULAR | Status: AC
Start: 2015-08-09 — End: 2015-08-09
  Filled 2015-08-09: qty 1

## 2015-08-09 MED ORDER — LIDOCAINE HCL (PF) 1 % IJ SOLN
INTRAMUSCULAR | Status: AC
  Filled 2015-08-09: qty 30

## 2015-08-09 MED ORDER — HEPARIN (PORCINE) IN NACL 100-0.45 UNIT/ML-% IJ SOLN
1800.0000 [IU]/h | INTRAMUSCULAR | Status: DC
  Administered 2015-08-09 (×2): 1650 [IU]/h via INTRAVENOUS
  Administered 2015-08-10: 1800 [IU]/h via INTRAVENOUS
  Filled 2015-08-09: qty 250

## 2015-08-09 MED ORDER — HEPARIN (PORCINE) IN NACL 2-0.9 UNIT/ML-% IJ SOLN
INTRAMUSCULAR | Status: DC | PRN
Start: 2015-08-09 — End: 2015-08-09
  Administered 2015-08-09: 1000 mL via INTRA_ARTERIAL

## 2015-08-09 MED ORDER — MIDAZOLAM HCL 2 MG/2ML IJ SOLN
INTRAMUSCULAR | Status: DC | PRN
  Administered 2015-08-09: 0.5 mg via INTRAVENOUS

## 2015-08-09 MED ORDER — HEPARIN (PORCINE) IN NACL 2-0.9 UNIT/ML-% IJ SOLN
INTRAMUSCULAR | Status: AC
  Filled 2015-08-09: qty 1000

## 2015-08-09 MED ORDER — OXYCODONE HCL 5 MG PO TABS
5.0000 mg | ORAL_TABLET | ORAL | Status: DC | PRN
  Administered 2015-08-09 – 2015-08-11 (×3): 5 mg via ORAL
  Filled 2015-08-09 (×3): qty 1

## 2015-08-09 MED ORDER — FENTANYL CITRATE (PF) 100 MCG/2ML IJ SOLN
INTRAMUSCULAR | Status: DC | PRN
  Administered 2015-08-09: 25 ug via INTRAVENOUS

## 2015-08-09 SURGICAL SUPPLY — 14 items
CATH OMNI FLUSH 5F 65CM (CATHETERS) ×3 IMPLANT
KIT ENCORE 26 ADVANTAGE (KITS) ×3 IMPLANT
KIT MICROINTRODUCER STIFF 5F (SHEATH) ×3 IMPLANT
KIT PV (KITS) ×4 IMPLANT
SHEATH BRITE TIP 7FR 35CM (SHEATH) ×3 IMPLANT
SHEATH PINNACLE 5F 10CM (SHEATH) ×2 IMPLANT
STENT ICAST 7X22X120 (Permanent Stent) ×2 IMPLANT
SYR MEDRAD MARK V 150ML (SYRINGE) ×4 IMPLANT
TAPE RADIOPAQUE TURBO (MISCELLANEOUS) ×2 IMPLANT
TRANSDUCER W/STOPCOCK (MISCELLANEOUS) ×4 IMPLANT
TRAY PV CATH (CUSTOM PROCEDURE TRAY) ×4 IMPLANT
WIRE BENTSON .035X145CM (WIRE) ×2 IMPLANT
WIRE HI TORQ VERSACORE J 260CM (WIRE) ×2 IMPLANT
WIRE J 3MM .035X145CM (WIRE) ×2 IMPLANT

## 2015-08-09 NOTE — H&P (View-Only) (Signed)
Consult Note  Patient name: Cathy Jones MRN: [id number] DOB: 02-10-1961 Sex: female  Consulting Physician:  ER  Reason for Consult: No chief complaint on file.   HISTORY OF PRESENT ILLNESS: This is a 55 year old female who presented to the ER today with complaints of right leg numbness from the knee down.  She states that this has been present for about 8 days.  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  She was started on anticoagulation and had a follow up with Dr Donnetta Hutching next week.  She had a duplex today which showed an occluded right iliac artery  She has a history of cervical cancer and is s/p XRT and TAH.  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  She was admitted on 07-31-2015 with abdominal pain and nausea and vomiting with acute renal failure llikly from high output ostomy.  She was started on imodium.  She has a history of hypertension, hypercholesterolemia, managed with a statin and asthma  Past Medical History  Diagnosis Date  . Hypertension   . Hyperlipidemia   . Cervical cancer ([place])   . Asthma     pt brought her proair inhaler 06-06-14  . Colon polyp     hyperplastic  . Colon stricture ([place])   . Colonic obstruction ([place])   . Family history of adverse reaction to anesthesia     pts mother experiences nausea and vomiting   . Shortness of breath dyspnea     seasonal   . History of bronchitis   . Depression   . Headache     migraines  . History of hiatal hernia   . Ear infection     left   . Sinus infection     Past Surgical History  Procedure Laterality Date  . Abdominal hysterectomy  1994  . Colonoscopy    . Upper gastrointestinal endoscopy    . Tubal ligation    . Colon resection N/A 06/27/2015    Procedure: LYSIS OF ADHESIONS LOW ANTERIOR RESECTION DIVERTING LOOP  ILEOSTOMY;  Surgeon: Leighton Ruff, MD;  Location: [facility];  Service: General;  Laterality: N/A;  . Diverting ileostomy N/A 06/27/2015    Procedure: DIVERTING LOOP ILEOSTOMY;  Surgeon: Leighton Ruff, MD;  Location: [facility];  Service: General;  Laterality: N/A;  . Cystoscopy with stent placement Bilateral 06/27/2015    Procedure: CYSTOSCOPY WITH CATHETER  PLACEMENT;  Surgeon: Franchot Gallo, MD;  Location: [facility];  Service: Urology;  Laterality: Bilateral;    Social History   Social History  . Marital Status: Married    Spouse Name: N/A  . Number of Children: 1  . Years of Education: N/A   Occupational History  . Not on file.   Social History Main Topics  . Smoking status: Former Smoker -- 0.50 packs/day for 40 years    Types: Cigarettes    Quit date: 06/08/2014  . Smokeless tobacco: Never Used  . Alcohol Use: Yes     Comment: occas  . Drug Use: Yes  . Sexual Activity: Not on file   Other Topics Concern  . Not on file   Social History Narrative    Family History  Problem Relation Age of Onset  . Colon cancer Neg Hx   . Stomach cancer Neg Hx   .  Esophageal cancer Neg Hx   . Rectal cancer Cousin   . Hypertension Mother   . Hypertension Father   . CAD Father     CABG    Allergies as of 08/07/2015 - Review Complete 08/07/2015  Allergen Reaction Noted  . Codeine Other (See Comments) 10/13/2013    No current facility-administered medications on file prior to encounter.   Current Outpatient Prescriptions on File Prior to Encounter  Medication Sig Dispense Refill  . apixaban (ELIQUIS) 5 MG TABS tablet Take 2 tablets (10 mg total) by mouth 2 (two) times daily. 6 tablet 0  . [START ON 08/08/2015] apixaban (ELIQUIS) 5 MG TABS tablet Take 1 tablet (5 mg total) by mouth 2 (two) times daily. 60 tablet 0  . atorvastatin (LIPITOR) 10 MG tablet Take 10 mg by mouth every morning.    . diphenoxylate-atropine (LOMOTIL) 2.5-0.025 MG tablet Take 2 tablets by mouth 4 (four) times  daily -  before meals and at bedtime. 60 tablet 3  . ferrous sulfate 325 (65 FE) MG tablet Take 1 tablet (325 mg total) by mouth 2 (two) times daily with a meal. 60 tablet 1  . folic acid (FOLVITE) 1 MG tablet Take 1 tablet (1 mg total) by mouth daily. 30 tablet 0  . loperamide (IMODIUM) 2 MG capsule Take 2 capsules (4 mg total) by mouth 4 (four) times daily -  before meals and at bedtime. 100 capsule 5  . LORazepam (ATIVAN) 0.5 MG tablet Take 0.5 mg by mouth every morning.    . nystatin (MYCOSTATIN/NYSTOP) 100000 UNIT/GM POWD Apply under pannus TID 30 g 0  . paregoric 2 MG/5ML solution Take 5-10 mLs by mouth every 6 (six) hours as needed for diarrhea or loose stools (Give if > 329mL q6h). 120 mL 0  . polycarbophil (FIBERCON) 625 MG tablet Take 1 tablet (625 mg total) by mouth 2 (two) times daily. 60 tablet 2  . PROAIR HFA 108 (90 BASE) MCG/ACT inhaler Inhale 1-2 puffs into the lungs every 4 (four) hours as needed for wheezing or shortness of breath.     . sertraline (ZOLOFT) 100 MG tablet Take one tablet by mouth every morning.  0  . vitamin B-12 1000 MCG tablet Take 0.5 tablets (500 mcg total) by mouth daily.       REVIEW OF SYSTEMS: See HPI, otherwise, all systems negative  PHYSICAL EXAMINATION: General: The patient appears their stated age.  Vital signs are BP 92/47 mmHg  Pulse 93  Temp(Src) 97.8 F (36.6 C) (Oral)  Resp 17  SpO2 94% Pulmonary: Respirations are non-labored HEENT:  No gross abnormalities Abdomen: Soft and non-tender, ostomy is functioning Musculoskeletal: There are no major deformities.   Neurologic: decreased sensation to the anterior right leg from the knee down.  Sensation is present but diminished on the posterior side of the right leg.  Motor function is intact Skin: There are no ulcer or rashes noted. Psychiatric: The patient has normal affect. Cardiovascular: There is a regular rate and rhythm without significant murmur appreciated.  No palpable right femoral  pulse, palpable left femoral pulse.  No audible right pedal doppler signals, biphasic left DP/PT  Diagnostic Studies: I have reviewed her vascular lab studies as follows: ABI RIGHT    LEFT    PRESSURE WAVEFORM  PRESSURE WAVEFORM  BRACHIAL 92 Bi BRACHIAL 70 Mono  DP   DP    AT ---  AT 50 Mono   PT ----  PT 52 Mono   PER  PER    GREAT TOE  NA GREAT TOE  NA    RIGHT LEFT  ABI ---- 0.57           Assessment:  Right lower extremity ischemia Plan: Currently, the right leg is viable, and therefore, no immediate surgical exploration is required.  I would recommend vascular imaging to better define her anatomy once her creatinine improves and she can tolerate a small dye load.  She will contact me tonight if she experiences worsening symptoms.  I would discontinue her Eliquis and start a heparin drip without bolus  ARF:  Hydrate overnight and monitor ostomy output Hypotension:  Likely related to dehydration secondary to high out put ostomy, hydrate overnight  Dr Donnetta Hutching to follow up in the morning.  Once Eliquis has had time to wear off, and her creatinine improves, she will need better imaging to plan her surgical repair  Recommend hospitalists admission to manage medical issues     V. Leia Alf, M.D. [facility] Office: [phone] Pager:  [phone]

## 2015-08-09 NOTE — Progress Notes (Signed)
Site area: left groin Site Prior to Removal:  Level  0 Pressure Applied For:  20 minutes Manual:   yes Patient Status During Pull:  stable Post Pull Site:  Level  0 Post Pull Instructions Given:  yes Post Pull Pulses Present: yes Dressing Applied:  tegaderm Bedrest begins @  1120 Comments:

## 2015-08-09 NOTE — Progress Notes (Addendum)
ANTICOAGULATION CONSULT NOTE - Follow Up Consult  Pharmacy Consult for heparin Indication: aortic thrombus and RLE ischemia  Allergies  Allergen Reactions  . Codeine Other (See Comments)    "hyper"    Patient Measurements: Height: 5\' 2"  (157.5 cm) Weight: 194 lb 1.6 oz (88.043 kg) IBW/kg (Calculated) : 50.1 Heparin Dosing Weight: 70 kg  Vital Signs: Temp: 98.1 F (36.7 C) (03/30 0536) Temp Source: Oral (03/30 0536) BP: 102/35 mmHg (03/30 1115) Pulse Rate: 67 (03/30 1115)  Labs:  Recent Labs  08/07/15 1819  08/07/15 2156 08/08/15 0453 08/08/15 0753 08/08/15 1817 08/09/15 0037 08/09/15 0400  HGB 9.5*  --   --  9.3*  --   --   --  7.7*  HCT 30.5*  --   --  29.4*  --   --   --  25.4*  PLT 283  --   --  266  --   --   --  255  APTT  --   < > 45*  --  52* 54*  --  69*  LABPROT  --   --   --   --   --   --   --  15.9*  INR  --   --   --   --   --   --   --  1.25  HEPARINUNFRC  --   --  >2.20*  --  >2.20*  --   --  0.33  CREATININE 1.84*  --   --  0.99  --   --  0.80 0.71  < > = values in this interval not displayed.  Estimated Creatinine Clearance: 83.8 mL/min (by C-G formula based on Cr of 0.71).   Medications:  Infusions:  . sodium chloride 150 mL/hr at 08/08/15 1800  . sodium chloride 1 mL/kg/hr (08/09/15 1034)  . heparin Stopped (08/09/15 [id number])    Assessment: 70 yof admitted 08/07/2015 with hx of recent aortic valve thrombosis on Apixaban PTA with last dose 3/28 am admitted with right lower extremity ischemia. She is s/p angiogram with angioplasty and stenting of L common iliac artery. Pharmacy consulted to resume heparin drip 8 hrs post sheath removal. Sheath out at 10:09. Last HL therapeutic on 1650 units/hr, also seems to correlate wit aPTT of 69.  Goal of Therapy:  Heparin level 0.3-0.7 units/ml Monitor platelets by anticoagulation protocol: Yes   Plan:  - Resume heparin drip at 1650 units/hr with no bolus at 18:15 - 6 hr HL - Daily HL and CBC, d/c  aPTT - Monitor for s/sx of bleeding - F/u resuming oral [place], [place].D., BCPS Clinical Pharmacist Pager: [phone] 08/09/2015 12:47 PM

## 2015-08-09 NOTE — Op Note (Signed)
OPERATIVE NOTE   PROCEDURE: 1.  Left common femoral artery cannulation under ultrasound guidance 2.  Placement of catheter in aorta 3.  Aortogram 4.  Conscious sedation for 61 minutes 5.  Angioplasty and stenting left common iliac artery (iCAST 7 mm x 22 mm) 6.  Bilateral leg runoff via catheter  PRE-OPERATIVE DIAGNOSIS: right iliac artery occlusion, right leg ischemia  POST-OPERATIVE DIAGNOSIS: same as above   SURGEON: Adele Barthel, MD  ANESTHESIA: conscious sedation  ESTIMATED BLOOD LOSS: 50 cc  CONTRAST: 175 cc  FINDING(S):  Aorta: patent  Superior mesenteric artery: patent, incomplete visualized  Celiac artery: not visualilzed   Right Left  RA patent patent  CIA Occluded patent  EIA Occluded Patent  IIA Patent patent  CFA Patent, underfilled Patent  SFA Patent, underfilled Patent  PFA Patent, underfilled Patent  Pop Patent, underfilled Patent  Trif Patent, underfilled Patent  AT Patent, underfilled Patent, dwindles distally  Pero Patent, underfilled Patent  PT Patent, underfilled, not fully imaged Patent   SPECIMEN(S):  none  INDICATIONS:   Cathy Jones is a 55 y.o. female who presents with right iliac artery occlusion and right leg ischemia.  The patient presents for: aortogram, bilateral leg runoff, and possible intervention.  I discussed with the patient the nature of angiographic procedures, especially the limited patencies of any endovascular intervention.  The patient is aware of that the risks of an angiographic procedure include but are not limited to: bleeding, infection, access site complications, renal failure, embolization, rupture of vessel, dissection, possible need for emergent surgical intervention, possible need for surgical procedures to treat the patient's pathology, and stroke and death.  The patient is aware of the risks and agrees to proceed.  DESCRIPTION: After full informed consent was obtained from the patient, the patient was  brought back to the angiography suite.  The patient was placed supine upon the angiography table and connected to cardiopulmonary monitoring equipment.  The patient was then given conscious sedation, the amounts of which are documented in the patient's chart.  A circulating radiologic technician maintained continuous monitoring of the patient's cardiopulmonary status.  Additionally, the control room radiologic technician provided backup monitoring throughout the procedure.  The patient was prepped and drape in the standard fashion for an angiographic procedure.  At this point, attention was turned to the left groin.  Under ultrasound guidance, the subcutaneous tissue surrounding the left common femoral artery was anesthesized with 1% lidocaine with epinephrine.  The artery was then cannulated with a micropuncture needle.  The microwire was advanced into the common femoral artery but repeatedly defected into an iliac circumfle branch.  My manipulations failed to get out of this branch.  I pulled the needle and wire and held pressure for 5 minutes.  I repeated this same sequence of events more proximally, and this time the microwire deflected and looped back into the superficial femoral artery.  I removed the wire and sheath and then held pressure for 5 minutes.    Under ultrasound guidance,  The subcutaneous tissue surrounding the left proximal common femoral artery was anesthesized with 1% lidocaine with epinephrine.  The artery was then cannulated with a 18 gauge needle.  The Bentson wire also selected the circumflex branch.  I exchanged this for a J-wire which advanced up the iliac arterial system into the aorta.  The needle was exchanged for a 5-Fr sheath, which was advanced over the wire into the common femoral artery.  The dilator was then removed.  The  Omniflush catheter was then loaded over the wire up to the level of L1.  The catheter was connected to the power injector circuit.  After de-airring and  de-clotting the circuit, a power injector aortogram was completed.    I then pulled the catheter distally just proximal to a large lumbar branch feeding the right leg.  I did a magnified 30 degree LAO projection to image the pelvic circulation.  There was a total occlusion of the right common iliac artery and 75-90% stenosis of the mid-segment common iliac artery.  I elected to stent the left common iliac artery.  Based on prior CT scan, this segment is severely calcified, so I selected a 7 mm x 22 mm iCAST stent based on measurements.  A Versacore wire was loaded through the Dawes catheter.  The sheath was exchanged for a long 7-Fr sheath.   The patient was given 8000 units of Heparin intravenously, which was a therapeutic bolus.   I centered the stent on the stenosis and delivered the stent at 10 atm for 2 minutes.  Completion angiogram demonstrated complete resolution of the stenosis.  At this point, I exchanged the balloon for an Omniflush catheter which was replaced in the distal aorta, proximal to the large lumbar arteries feeding the right leg.  An automated bilateral leg runoff was completed.  The findings are listed above.  Due to technical issues, the proximal femoral runoff was visible, so I did a dedicated proximal femoral runoff to image such.  The findings are listed above.   I replaced the wire into the catheter, straightening out the crook in the catheter.  Both were removed from the sheath together.  I pulled the sheath back into the left external iliac artery.  The sheath was aspirated.  No clots were present and the sheath was reloaded with heparinized saline.    Based on the images, it appears she is a candidate for left-to-right femorofemoral bypass.   COMPLICATIONS: none  CONDITION: stable   Adele Barthel, MD [facility] Office: [phone] Pager: [phone]  08/09/2015, 10:19 AM

## 2015-08-09 NOTE — Progress Notes (Signed)
Discussed surgery tomorrow right femoral embolectomy, possible femoral femoral bypass.   Risks and benefits discussed and all questions answered  Dr. Donnetta Hutching to do surgery Stop heparin on call to OR  Cathy Jones

## 2015-08-09 NOTE — Progress Notes (Signed)
Cathy Jones [id number] DOB: 1961-03-25 DOA: 08/07/2015 PCP: Cathy Jones  Brief narrative: 55 y/o ? Smoker,  [place] [Cervical Ca 1994] s/p Chemo-XRT Colon obst c Distal  High-grade sigmoid strict @ 20-25 cm--[attempt by GI @ dilatation 05/2015] Severe P-EM ~ 100 lbs over 2016-2017 2/2 to stricture  3/25-hypertension, of cervical cancer, colon stricture status post partial colon resection and diverting ileostomy on 2/15 came to the ER when referred by Cathy Jones for abnormal lab work-At that admission Cathy Jones [facility]  Placed bilateral 6 Fr open-ended catheters Patient underwent on 06/27/15 PELVIC LYSIS OF ADHESIONS, LOW ANTERIOR RESECTION, DIVERTING LOOP ILEOSTOMY CYSTOSCOPY WITH STENT PLACEMENT Re-admit 3/20 witgh AKI, Creat 5.8, non-gap Met acidosis  2/2 to High-output Ileostomy, Desc Thor Aort thrombus + splenic infarcgts-hydrated and sent home  Admitted 08/07/15 R LE pain, found to have ischemia  Chart reviewed as below-   Consultants:  Vascular  Procedures: Conclusion of Angigram      Occluded right iliac system with some residual right common iliac artery segment  Left common iliac artery stenosis 75-90%: resolved after stenting  Likely mostly patent right leg arterial system (underfilled angiogram) with incomplete imaging of right posterior tibial artery  Patent left leg arterial system with some dwindling of left anterior tibial artery   Renal US IMPRESSION: Normal renal ultrasound. No hydronephrosis.   Antibiotics:  none   Subjective   Fair, states pain is 5/10. No nausea no vomiting Tolerating diet Still cannot sit up    Objective    Interim History:   Telemetry:    Objective: Filed Vitals:   08/09/15 1122 08/09/15 1215 08/09/15 1230 08/09/15 1300  BP: 98/39 115/53 101/53 114/65  Pulse: 68 81  123  Temp:      TempSrc:      Resp: 22     Height:      Weight:      SpO2: 96% 86%  78%    Intake/Output  Summary (Last 24 hours) at 08/09/15 1350 Last data filed at 08/09/15 0659  Gross per 24 hour  Intake   2040 ml  Output   2300 ml  Net   -260 ml    Exam:  General: EOMI NCAT pleasant no pallor no icterus Cardiovascular: S1-S2 no murmur rub or gallop Respiratory: Clinically clear no added sound Abdomen: Soft nontender nondistended no rebound no guarding Skin right lower extremity great toe slightly dusky but not painful to touch able to move toes without pain and I cannot appreciate by direct palpation dorsalis pedis nor posterior tibialis Neuro intact moving all 4 limbs equally power 5/5 Sensation is diminished in the right lower extremity and she has pain from the buttocks down to the toe on the right side  Data Reviewed: Basic Metabolic Panel:  Recent Labs Lab 08/03/15 0449 08/07/15 1819 08/08/15 0453 08/09/15 0037 08/09/15 0400  NA 139 133* 137 137 140  K 4.5 4.6 4.2 4.2 4.1  CL 114* 99* 107 109 113*  CO2 18* 21* 19* 21* 20*  GLUCOSE 99 115* 98 125* 98  BUN _0 CREATININE 0.68 1.84* 0.99 0.80 0.71  CALCIUM 8.6* 9.3 8.9 8.4* 8.5*   Liver Function Tests:  Recent Labs Lab 08/07/15 1819 08/09/15 0400  AST 14* 10*  ALT 10* 9*  ALKPHOS 156* 124  BILITOT 0.3 0.3  PROT 6.3* 5.2*  ALBUMIN 3.0* 2.4*   No results for input(s): LIPASE, AMYLASE in the last 168 hours. No results  for input(s): AMMONIA in the last 168 hours. CBC:  Recent Labs Lab 08/03/15 0449 08/07/15 1819 08/08/15 0453 08/09/15 0400  WBC 10.6* 14.9* 9.5 7.4  NEUTROABS  --  11.2*  --   --   HGB 8.6* 9.5* 9.3* 7.7*  HCT 26.3* 30.5* 29.4* 25.4*  MCV 82.2 83.3 82.8 83.8  PLT 181 283 266 255   Cardiac Enzymes: No results for input(s): CKTOTAL, CKMB, CKMBINDEX, TROPONINI in the last 168 hours. BNP: Invalid input(s): POCBNP CBG: No results for input(s): GLUCAP in the last 168 hours.  Recent Results (from the past 240 hour(s))  MRSA PCR Screening     Status: None   Collection Time:  08/08/15  1:30 PM  Result Value Ref Range Status   MRSA by PCR NEGATIVE NEGATIVE Final    Comment:        The GeneXpert MRSA Assay (FDA approved for NASAL specimens only), is one component of a comprehensive MRSA colonization surveillance program. It is not intended to diagnose MRSA infection nor to guide or monitor treatment for MRSA infections.      Studies:              All Imaging reviewed and is as per above notation   Scheduled Meds: . diphenoxylate-atropine  2 tablet Oral TID AC & HS  . ferrous sulfate  325 mg Oral BID WC  . folic acid  1 mg Oral Daily  . loperamide  4 mg Oral TID AC & HS  . LORazepam  0.5 mg Oral q morning - 10a  . polycarbophil  625 mg Oral BID  . sertraline  100 mg Oral q morning - 10a  . vitamin B-12  100 mcg Oral Daily   Continuous Infusions: . sodium chloride 150 mL/hr at 08/08/15 1800  . sodium chloride 1 mL/kg/hr (08/09/15 1034)  . heparin       Assessment/Plan:  1. Ischemic RLE-appreciate VVS input-Noted R iliac occlusion on US-per VVS angio in next few days-Continue heparin GTT  & transition to NOAC per VVS-given that she is not in pain and her pain has significantly improved, transition Dilaudid every 2--every 4.--Added OxyIR for pain every 3 when necessary 2. Recent Colonic obstruct [see above]-high output ostomy.  No strict i/o? Cont LOmotil 2 tab tid AC + HS, Loperamide 4 tid ACHS--if output >input, will ask Ostomy nurse or Gen surg for assist 3. Severe P-EM-see above discussion 4. Hypovolemia + AKI 2/2 to high-output ostomy-slight non gap Met acidosis -imporved.  BUN/CREAT 17/1.84-->14/0.99-->8/0.71 currently.  Continue Saline @ 150 cc/hr--cut back to 125 cc/hr today 3/30 5. Bipolar-continue Sertraline 100 q am, Ativan 0.5 q am 6. Dilutional + micocytic anemia-Hb trend 9.3-->7.7.  Monitor trends, hemoccult to ensure no bleeding.  7. Prior history of cervical adeno CA status post TAH/BSO 1990s-needs surveillance as an  outpatient-unclear underlying etiology for splenic infarct and other issues.    Full code Inpatient Tele   25 min  Cathy Griffes, MD  [facility] Pager [phone] 08/09/2015, 1:50 PM    LOS: 2 days

## 2015-08-09 NOTE — Interval H&P Note (Signed)
History and Physical Interval Note:  08/09/2015 8:31 AM  Cathy Jones  has presented today for surgery, with the diagnosis of assemic right leg  The various methods of treatment have been discussed with the patient and family. After consideration of risks, benefits and other options for treatment, the patient has consented to  Procedure(s): Abdominal Aortogram w/Lower Extremity (N/A) as a surgical intervention .  The patient's history has been reviewed, patient examined, no change in status, stable for surgery.  I have reviewed the patient's chart and labs.  Questions were answered to the patient's satisfaction.     Adele Barthel

## 2015-08-09 NOTE — Progress Notes (Signed)
ANTICOAGULATION CONSULT NOTE - Follow Up Consult  Pharmacy Consult for heparin Indication: aortic thrombus and RLE ischemia  Labs:  Recent Labs  08/07/15 1819  08/07/15 2156 08/08/15 0453 08/08/15 0753 08/08/15 1817 08/09/15 0037 08/09/15 0400  HGB 9.5*  --   --  9.3*  --   --   --  7.7*  HCT 30.5*  --   --  29.4*  --   --   --  25.4*  PLT 283  --   --  266  --   --   --  255  APTT  --   < > 45*  --  52* 54*  --  69*  LABPROT  --   --   --   --   --   --   --  15.9*  INR  --   --   --   --   --   --   --  1.25  HEPARINUNFRC  --   --  >2.20*  --  >2.20*  --   --   --   CREATININE 1.84*  --   --  0.99  --   --  0.80  --   < > = values in this interval not displayed.   Assessment/Plan:  55yo female therapeutic on heparin after rate change. Will continue gtt at current rate and confirm stable with additional PTT.   Wynona Neat, PharmD, BCPS  08/09/2015,4:54 AM

## 2015-08-10 ENCOUNTER — Inpatient Hospital Stay (HOSPITAL_COMMUNITY): Payer: BLUE CROSS/BLUE SHIELD

## 2015-08-10 ENCOUNTER — Encounter (HOSPITAL_COMMUNITY)
Admission: EM | Disposition: A | Discharge status: Home / Self Care | Payer: Self-pay | Source: Home / Self Care | Attending: Family Medicine

## 2015-08-10 ENCOUNTER — Inpatient Hospital Stay (HOSPITAL_COMMUNITY): Payer: BLUE CROSS/BLUE SHIELD | Admitting: Certified Registered Nurse Anesthetist

## 2015-08-10 DIAGNOSIS — I998 Other disorder of circulatory system: Secondary | ICD-10-CM

## 2015-08-10 DIAGNOSIS — I739 Peripheral vascular disease, unspecified: Secondary | ICD-10-CM | POA: Diagnosis present

## 2015-08-10 HISTORY — PX: FEMORAL-FEMORAL BYPASS GRAFT: SHX936

## 2015-08-10 HISTORY — PX: INTRAOPERATIVE ARTERIOGRAM: SHX5157

## 2015-08-10 HISTORY — PX: THROMBECTOMY ILIAC ARTERY: SHX6405

## 2015-08-10 LAB — CBC WITH DIFFERENTIAL/PLATELET
Basophils Absolute: 0 10*3/uL (ref 0.0–0.1)
Basophils Relative: 0 %
Eosinophils Absolute: 0.1 10*3/uL (ref 0.0–0.7)
Eosinophils Relative: 2 %
HCT: 26 % — ABNORMAL LOW (ref 36.0–46.0)
Hemoglobin: 8.4 g/dL — ABNORMAL LOW (ref 12.0–15.0)
Lymphocytes Relative: 27 %
Lymphs Abs: 2.2 10*3/uL (ref 0.7–4.0)
MCH: 26.9 pg (ref 26.0–34.0)
MCHC: 32.3 g/dL (ref 30.0–36.0)
MCV: 83.3 fL (ref 78.0–100.0)
Monocytes Absolute: 0.7 10*3/uL (ref 0.1–1.0)
Monocytes Relative: 8 %
Neutro Abs: 5.4 10*3/uL (ref 1.7–7.7)
Neutrophils Relative %: 63 %
Platelets: 250 10*3/uL (ref 150–400)
RBC: 3.12 MIL/uL — ABNORMAL LOW (ref 3.87–5.11)
RDW: 14.7 % (ref 11.5–15.5)
WBC: 8.4 10*3/uL (ref 4.0–10.5)

## 2015-08-10 LAB — COMPREHENSIVE METABOLIC PANEL
ALT: 12 U/L — ABNORMAL LOW (ref 14–54)
AST: 14 U/L — ABNORMAL LOW (ref 15–41)
Albumin: 2.6 g/dL — ABNORMAL LOW (ref 3.5–5.0)
Alkaline Phosphatase: 137 U/L — ABNORMAL HIGH (ref 38–126)
Anion gap: 6 (ref 5–15)
BUN: 6 mg/dL (ref 6–20)
CO2: 22 mmol/L (ref 22–32)
Calcium: 8.6 mg/dL — ABNORMAL LOW (ref 8.9–10.3)
Chloride: 108 mmol/L (ref 101–111)
Creatinine, Ser: 0.86 mg/dL (ref 0.44–1.00)
GFR calc Af Amer: >60 mL/min (ref >60)
GFR calc non Af Amer: >60 mL/min (ref >60)
Glucose, Bld: 108 mg/dL — ABNORMAL HIGH (ref 65–99)
Potassium: 4.5 mmol/L (ref 3.5–5.1)
Sodium: 136 mmol/L (ref 135–145)
Total Bilirubin: 0.4 mg/dL (ref 0.3–1.2)
Total Protein: 5.5 g/dL — ABNORMAL LOW (ref 6.5–8.1)

## 2015-08-10 LAB — HEPARIN LEVEL (UNFRACTIONATED)
Heparin Unfractionated: 0.21 IU/mL — ABNORMAL LOW (ref 0.30–0.70)
Heparin Unfractionated: 0.4 IU/mL (ref 0.30–0.70)

## 2015-08-10 LAB — PROTIME-INR
INR: 1.17 (ref 0.00–1.49)
Prothrombin Time: 15.1 seconds (ref 11.6–15.2)

## 2015-08-10 LAB — CREATININE, SERUM
Creatinine, Ser: 0.83 mg/dL (ref 0.44–1.00)
GFR calc Af Amer: >60 mL/min (ref >60)

## 2015-08-10 LAB — CBC
HEMATOCRIT: 28 % — AB (ref 36.0–46.0)
Hemoglobin: 8.7 g/dL — ABNORMAL LOW (ref 12.0–15.0)
MCH: 26 pg (ref 26.0–34.0)
MCHC: 31.1 g/dL (ref 30.0–36.0)
MCV: 83.6 fL (ref 78.0–100.0)
Platelets: 252 10*3/uL (ref 150–400)
RBC: 3.35 MIL/uL — ABNORMAL LOW (ref 3.87–5.11)
RDW: 14.6 % (ref 11.5–15.5)
WBC: 8 10*3/uL (ref 4.0–10.5)

## 2015-08-10 LAB — LUPUS ANTICOAGULANT PANEL
DRVVT: 125.8 s — AB (ref 0.0–44.0)
PTT Lupus Anticoagulant: 67.1 s — ABNORMAL HIGH (ref 0.0–43.6)

## 2015-08-10 LAB — DRVVT CONFIRM: dRVVT Confirm: 1.1 ratio (ref 0.8–1.2)

## 2015-08-10 LAB — HEXAGONAL PHASE PHOSPHOLIPID: HEXAGONAL PHASE PHOSPHOLIPID: 22 s — AB (ref 0–11)

## 2015-08-10 LAB — PTT-LA MIX: PTT-LA MIX: 64.9 s — AB (ref 0.0–40.6)

## 2015-08-10 LAB — DRVVT MIX: DRVVT MIX: 71.3 s — AB (ref 0.0–44.0)

## 2015-08-10 SURGERY — CREATION, BYPASS, ARTERIAL, FEMORAL TO FEMORAL, USING GRAFT
Anesthesia: General | Site: Groin | Laterality: Right

## 2015-08-10 MED ORDER — ONDANSETRON HCL 4 MG/2ML IJ SOLN
INTRAMUSCULAR | Status: DC | PRN
Start: 2015-08-10 — End: 2015-08-10
  Administered 2015-08-10: 4 mg via INTRAVENOUS

## 2015-08-10 MED ORDER — LACTATED RINGERS IV SOLN
INTRAVENOUS | Status: DC
  Administered 2015-08-10 (×2): via INTRAVENOUS
  Administered 2015-08-10: 35 mL/h via INTRAVENOUS

## 2015-08-10 MED ORDER — LIDOCAINE HCL (CARDIAC) 20 MG/ML IV SOLN
INTRAVENOUS | Status: DC | PRN
  Administered 2015-08-10: 60 mg via INTRAVENOUS

## 2015-08-10 MED ORDER — ENOXAPARIN SODIUM 30 MG/0.3ML ~~LOC~~ SOLN
30.0000 mg | SUBCUTANEOUS | Status: DC
  Administered 2015-08-10: 30 mg via SUBCUTANEOUS
  Filled 2015-08-10: qty 0.3

## 2015-08-10 MED ORDER — ALUM & MAG HYDROXIDE-SIMETH 200-200-20 MG/5ML PO SUSP: 15.0000 mL | ORAL | Status: DC | PRN

## 2015-08-10 MED ORDER — SUGAMMADEX SODIUM 200 MG/2ML IV SOLN
INTRAVENOUS | Status: DC | PRN
  Administered 2015-08-10: 200 mg via INTRAVENOUS

## 2015-08-10 MED ORDER — FERROUS SULFATE 325 (65 FE) MG PO TABS
325.0000 mg | ORAL_TABLET | Freq: Three times a day (TID) | ORAL | Status: DC
  Administered 2015-08-10 – 2015-08-12 (×5): 325 mg via ORAL
  Filled 2015-08-10 (×5): qty 1

## 2015-08-10 MED ORDER — POTASSIUM CHLORIDE CRYS ER 20 MEQ PO TBCR: 20.0000 meq | EXTENDED_RELEASE_TABLET | Freq: Every day | ORAL | Status: DC | PRN

## 2015-08-10 MED ORDER — ESMOLOL HCL 100 MG/10ML IV SOLN
INTRAVENOUS | Status: DC | PRN
  Administered 2015-08-10: 40 mg via INTRAVENOUS

## 2015-08-10 MED ORDER — SODIUM CHLORIDE 0.9 % IV SOLN
INTRAVENOUS | Status: DC | PRN
  Administered 2015-08-10: 13:00:00

## 2015-08-10 MED ORDER — IOPAMIDOL (ISOVUE-300) INJECTION 61%
INTRAVENOUS | Status: AC
  Filled 2015-08-10: qty 50

## 2015-08-10 MED ORDER — SODIUM CHLORIDE 0.9 % IV SOLN: 500.0000 mL | Freq: Once | INTRAVENOUS | Status: DC | PRN

## 2015-08-10 MED ORDER — EPHEDRINE SULFATE 50 MG/ML IJ SOLN
INTRAMUSCULAR | Status: DC | PRN
  Administered 2015-08-10: 5 mg via INTRAVENOUS

## 2015-08-10 MED ORDER — IOPAMIDOL (ISOVUE-300) INJECTION 61%
INTRAVENOUS | Status: DC | PRN
  Administered 2015-08-10: 10 mL via INTRA_ARTERIAL

## 2015-08-10 MED ORDER — ACETAMINOPHEN 160 MG/5ML PO SOLN
325.0000 mg | ORAL | Status: DC | PRN
  Filled 2015-08-10: qty 20.3

## 2015-08-10 MED ORDER — SODIUM CHLORIDE 0.9 % IV SOLN: INTRAVENOUS | Status: DC

## 2015-08-10 MED ORDER — PHENOL 1.4 % MT LIQD
1.0000 | OROMUCOSAL | Status: DC | PRN
Start: 2015-08-10 — End: 2015-08-12

## 2015-08-10 MED ORDER — OXYCODONE HCL 5 MG PO TABS: 5.0000 mg | ORAL_TABLET | Freq: Once | ORAL | Status: DC | PRN

## 2015-08-10 MED ORDER — FENTANYL CITRATE (PF) 250 MCG/5ML IJ SOLN
INTRAMUSCULAR | Status: AC
  Filled 2015-08-10: qty 5

## 2015-08-10 MED ORDER — DOCUSATE SODIUM 100 MG PO CAPS: 100.0000 mg | ORAL_CAPSULE | Freq: Every day | ORAL | Status: DC

## 2015-08-10 MED ORDER — ENOXAPARIN SODIUM 30 MG/0.3ML ~~LOC~~ SOLN: 30.0000 mg | SUBCUTANEOUS | Status: DC

## 2015-08-10 MED ORDER — PROPOFOL 10 MG/ML IV BOLUS
INTRAVENOUS | Status: DC | PRN
  Administered 2015-08-10: 120 mg via INTRAVENOUS

## 2015-08-10 MED ORDER — MIDAZOLAM HCL 5 MG/5ML IJ SOLN
INTRAMUSCULAR | Status: DC | PRN
  Administered 2015-08-10: 2 mg via INTRAVENOUS

## 2015-08-10 MED ORDER — PAREGORIC 2 MG/5ML PO TINC: 5.0000 mL | Freq: Four times a day (QID) | ORAL | Status: DC | PRN

## 2015-08-10 MED ORDER — PHENYLEPHRINE HCL 10 MG/ML IJ SOLN
10.0000 mg | INTRAVENOUS | Status: DC | PRN
  Administered 2015-08-10: 10 ug/min via INTRAVENOUS

## 2015-08-10 MED ORDER — PROTAMINE SULFATE 10 MG/ML IV SOLN
INTRAVENOUS | Status: DC | PRN
  Administered 2015-08-10: 50 mg via INTRAVENOUS

## 2015-08-10 MED ORDER — PANTOPRAZOLE SODIUM 40 MG PO TBEC
40.0000 mg | DELAYED_RELEASE_TABLET | Freq: Every day | ORAL | Status: DC
  Administered 2015-08-10 – 2015-08-12 (×3): 40 mg via ORAL
  Filled 2015-08-10 (×3): qty 1

## 2015-08-10 MED ORDER — HYDRALAZINE HCL 20 MG/ML IJ SOLN: 5.0000 mg | INTRAMUSCULAR | Status: DC | PRN

## 2015-08-10 MED ORDER — 0.9 % SODIUM CHLORIDE (POUR BTL) OPTIME
TOPICAL | Status: DC | PRN
  Administered 2015-08-10: 2000 mL

## 2015-08-10 MED ORDER — FENTANYL CITRATE (PF) 100 MCG/2ML IJ SOLN
INTRAMUSCULAR | Status: DC | PRN
  Administered 2015-08-10 (×4): 50 ug via INTRAVENOUS
  Administered 2015-08-10: 100 ug via INTRAVENOUS
  Administered 2015-08-10: 50 ug via INTRAVENOUS

## 2015-08-10 MED ORDER — VECURONIUM BROMIDE 10 MG IV SOLR
INTRAVENOUS | Status: DC | PRN
  Administered 2015-08-10: 2 mg via INTRAVENOUS

## 2015-08-10 MED ORDER — OXYCODONE HCL 5 MG/5ML PO SOLN: 5.0000 mg | Freq: Once | ORAL | Status: DC | PRN

## 2015-08-10 MED ORDER — METOPROLOL TARTRATE 1 MG/ML IV SOLN: 2.0000 mg | INTRAVENOUS | Status: DC | PRN

## 2015-08-10 MED ORDER — SUGAMMADEX SODIUM 200 MG/2ML IV SOLN
INTRAVENOUS | Status: AC
  Filled 2015-08-10: qty 2

## 2015-08-10 MED ORDER — LABETALOL HCL 5 MG/ML IV SOLN: 10.0000 mg | INTRAVENOUS | Status: DC | PRN

## 2015-08-10 MED ORDER — GUAIFENESIN-DM 100-10 MG/5ML PO SYRP: 15.0000 mL | ORAL_SOLUTION | ORAL | Status: DC | PRN

## 2015-08-10 MED ORDER — MIDAZOLAM HCL 2 MG/2ML IJ SOLN
INTRAMUSCULAR | Status: AC
  Filled 2015-08-10: qty 2

## 2015-08-10 MED ORDER — HYDROMORPHONE HCL 1 MG/ML IJ SOLN: 0.2500 mg | INTRAMUSCULAR | Status: DC | PRN

## 2015-08-10 MED ORDER — MAGNESIUM SULFATE 2 GM/50ML IV SOLN: 2.0000 g | Freq: Every day | INTRAVENOUS | Status: DC | PRN

## 2015-08-10 MED ORDER — HEPARIN SODIUM (PORCINE) 1000 UNIT/ML IJ SOLN
INTRAMUSCULAR | Status: DC | PRN
  Administered 2015-08-10: 8000 [IU] via INTRAVENOUS

## 2015-08-10 MED ORDER — ACETAMINOPHEN 325 MG PO TABS
325.0000 mg | ORAL_TABLET | ORAL | Status: DC | PRN
Start: 2015-08-10 — End: 2015-08-10

## 2015-08-10 MED ORDER — DEXTROSE 5 % IV SOLN
1.5000 g | Freq: Two times a day (BID) | INTRAVENOUS | Status: AC
  Administered 2015-08-10 – 2015-08-11 (×2): 1.5 g via INTRAVENOUS
  Filled 2015-08-10 (×2): qty 1.5

## 2015-08-10 MED ORDER — ROCURONIUM BROMIDE 100 MG/10ML IV SOLN
INTRAVENOUS | Status: DC | PRN
  Administered 2015-08-10: 50 mg via INTRAVENOUS

## 2015-08-10 SURGICAL SUPPLY — 58 items
APL SKNCLS STERI-STRIP NONHPOA (GAUZE/BANDAGES/DRESSINGS) ×2
BENZOIN TINCTURE PRP APPL 2/3 (GAUZE/BANDAGES/DRESSINGS) ×4 IMPLANT
CANISTER SUCTION 2500CC (MISCELLANEOUS) ×4 IMPLANT
CANNULA VESSEL 3MM 2 BLNT TIP (CANNULA) ×11 IMPLANT
CATH EMB 4FR 80CM (CATHETERS) ×2 IMPLANT
CLIP LIGATING EXTRA MED SLVR (CLIP) ×4 IMPLANT
CLIP LIGATING EXTRA SM BLUE (MISCELLANEOUS) ×4 IMPLANT
CLOSURE WOUND 1/2 X4 (GAUZE/BANDAGES/DRESSINGS) ×1
DRAIN SNY 10X20 3/4 PERF (WOUND CARE) IMPLANT
DRAPE C-ARM 42X72 X-RAY (DRAPES) ×3 IMPLANT
DRSG COVADERM 4X10 (GAUZE/BANDAGES/DRESSINGS) IMPLANT
ELECT REM PT RETURN 9FT ADLT (ELECTROSURGICAL) ×4
ELECTRODE REM PT RTRN 9FT ADLT (ELECTROSURGICAL) ×2 IMPLANT
EVACUATOR SILICONE 100CC (DRAIN) IMPLANT
GLOVE BIO SURGEON STRL SZ8 (GLOVE) ×2 IMPLANT
GLOVE BIOGEL PI IND STRL 6.5 (GLOVE) IMPLANT
GLOVE BIOGEL PI IND STRL 7.0 (GLOVE) IMPLANT
GLOVE BIOGEL PI IND STRL 7.5 (GLOVE) ×1 IMPLANT
GLOVE BIOGEL PI INDICATOR 6.5 (GLOVE) ×4
GLOVE BIOGEL PI INDICATOR 7.0 (GLOVE) ×4
GLOVE BIOGEL PI INDICATOR 7.5 (GLOVE) ×2
GLOVE ECLIPSE 6.5 STRL STRAW (GLOVE) ×3 IMPLANT
GLOVE ECLIPSE 7.0 STRL STRAW (GLOVE) ×4 IMPLANT
GLOVE SS BIOGEL STRL SZ 7 (GLOVE) IMPLANT
GLOVE SS BIOGEL STRL SZ 7.5 (GLOVE) ×2 IMPLANT
GLOVE SUPERSENSE BIOGEL SZ 7 (GLOVE) ×4
GLOVE SUPERSENSE BIOGEL SZ 7.5 (GLOVE) ×4
GOWN STRL REUS W/ TWL LRG LVL3 (GOWN DISPOSABLE) ×6 IMPLANT
GOWN STRL REUS W/TWL LRG LVL3 (GOWN DISPOSABLE) ×12
GRAFT HEMASHIELD 8MM (Vascular Products) ×4 IMPLANT
GRAFT VASC STRG 30X8KNIT (Vascular Products) IMPLANT
INSERT FOGARTY SM (MISCELLANEOUS) ×4 IMPLANT
KIT BASIN OR (CUSTOM PROCEDURE TRAY) ×4 IMPLANT
KIT ROOM TURNOVER OR (KITS) ×4 IMPLANT
LIQUID BAND (GAUZE/BANDAGES/DRESSINGS) ×2 IMPLANT
LOOP VESSEL MAXI BLUE (MISCELLANEOUS) ×3 IMPLANT
NS IRRIG 1000ML POUR BTL (IV SOLUTION) ×8 IMPLANT
PACK PERIPHERAL VASCULAR (CUSTOM PROCEDURE TRAY) ×4 IMPLANT
PAD ARMBOARD 7.5X6 YLW CONV (MISCELLANEOUS) ×8 IMPLANT
SET COLLECT BLD 21X3/4 12 PB (MISCELLANEOUS) ×2 IMPLANT
SHEATH PINNACLE R/O II 6F 4CM (SHEATH) ×2 IMPLANT
STAPLER VISISTAT 35W (STAPLE) IMPLANT
STOPCOCK 4 WAY LG BORE MALE ST (IV SETS) ×2 IMPLANT
STRIP CLOSURE SKIN 1/2X4 (GAUZE/BANDAGES/DRESSINGS) ×3 IMPLANT
SUT ETHILON 3 0 PS 1 (SUTURE) IMPLANT
SUT PROLENE 5 0 C 1 24 (SUTURE) ×8 IMPLANT
SUT PROLENE 6 0 CC (SUTURE) IMPLANT
SUT SILK 2 0 SH (SUTURE) IMPLANT
SUT VIC AB 2-0 CTX 36 (SUTURE) ×8 IMPLANT
SUT VIC AB 3-0 SH 27 (SUTURE) ×8
SUT VIC AB 3-0 SH 27X BRD (SUTURE) ×4 IMPLANT
SYR 20CC LL (SYRINGE) ×3 IMPLANT
SYR 30ML LL (SYRINGE) ×3 IMPLANT
TRAY FOLEY W/METER SILVER 16FR (SET/KITS/TRAYS/PACK) ×4 IMPLANT
TUBING EXTENTION W/L.L. (IV SETS) ×2 IMPLANT
UNDERPAD 30X30 INCONTINENT (UNDERPADS AND DIAPERS) ×4 IMPLANT
WATER STERILE IRR 1000ML POUR (IV SOLUTION) ×4 IMPLANT
WIRE BENTSON .035X145CM (WIRE) ×2 IMPLANT

## 2015-08-10 NOTE — Progress Notes (Signed)
Foley catheter removed without difficulty. Pt assisted to bathroom at 22:55 where she voided 200 ccs clear yellow urine.

## 2015-08-10 NOTE — Transfer of Care (Signed)
Immediate Anesthesia Transfer of Care Note  Patient: Cathy Jones  Procedure(s) Performed: Procedure(s): BYPASS GRAFT LEFT FEMORALTO RIGHT FEMORAL ARTERY (Bilateral) THROMBECTOMY Right ILIAC ARTERY (Right) INTRA OPERATIVE ARTERIOGRAM Right Iliac Artery (Right)  Patient Location: PACU  Anesthesia Type:General  Level of Consciousness: awake, alert  and oriented  Airway & Oxygen Therapy: Patient Spontanous Breathing and Patient connected to nasal cannula oxygen  Post-op Assessment: Report given to RN and Post -op Vital signs reviewed and stable  Post vital signs: Reviewed and stable  Last Vitals:  Filed Vitals:   08/10/15 1408 08/10/15 1410  BP: 128/71   Pulse: 82   Temp:  37 C  Resp: 10     Complications: No apparent anesthesia complications

## 2015-08-10 NOTE — Progress Notes (Signed)
ANTICOAGULATION CONSULT NOTE - Follow Up Consult  Pharmacy Consult for Heparin  Indication: Aortic thrombus/RLE ischemia  Allergies  Allergen Reactions  . Codeine Other (See Comments)    "hyper"    Patient Measurements: Height: 5\' 2"  (157.5 cm) Weight: 194 lb 1.6 oz (88.043 kg) IBW/kg (Calculated) : 50.1  Vital Signs: Temp: 98.5 F (36.9 C) (03/31 0055) Temp Source: Oral (03/31 0055) BP: 101/43 mmHg (03/31 0057) Pulse Rate: 67 (03/31 0055)  Labs:  Recent Labs  08/08/15 0453 08/08/15 0753 08/08/15 1817 08/09/15 0037 08/09/15 0400 08/10/15 0101  HGB 9.3*  --   --   --  7.7* 8.4*  HCT 29.4*  --   --   --  25.4* 26.0*  PLT 266  --   --   --  255 250  APTT  --  52* 54*  --  69*  --   LABPROT  --   --   --   --  15.9* 15.1  INR  --   --   --   --  1.25 1.17  HEPARINUNFRC  --  >2.20*  --   --  0.33 0.21*  CREATININE 0.99  --   --  0.80 0.71  --     Estimated Creatinine Clearance: 83.8 mL/min (by C-G formula based on Cr of 0.71).    Assessment: Heparin level sub-therapeutic after re-start s/p vascular surgery procedure, now with plans for OR this AM for possible L to R fem-fem bypass. No issues per RN.    Goal of Therapy:  Heparin level 0.3-0.7 units/ml Monitor platelets by anticoagulation protocol: Yes   Plan:  -Increase heparin to 1800 units/hr -0900 HL -Heparin off on call to OR today  Narda Bonds 08/10/2015,1:31 AM

## 2015-08-10 NOTE — Care Management Note (Signed)
Case Management Note  Patient Details  Name: Cathy Jones MRN: [id number] Date of Birth: 08/12/1960  Subjective/Objective:  Pt admitted for Right leg ischemia. S/P  Right iliac thrombectomy, right iliac arteriogram, left to right femorofemoral bypass.                    Action/Plan: CM to monitor for disposition needs.    Expected Discharge Date:                  Expected Discharge Plan:  Home/Self Care  In-House Referral:     Discharge planning Services  CM Consult  Post Acute Care Choice:    Choice offered to:     DME Arranged:    DME Agency:     HH Arranged:    HH Agency:     Status of Service:  In process, will continue to follow  Medicare Important Message Given:    Date Medicare IM Given:    Medicare IM give by:    Date Additional Medicare IM Given:    Additional Medicare Important Message give by:     If discussed at [place] of Stay Meetings, dates discussed:    Additional Comments:  Bethena Roys, RN 08/10/2015, 4:05 PM

## 2015-08-10 NOTE — Anesthesia Procedure Notes (Signed)
Procedure Name: Intubation Date/Time: 08/10/2015 11:45 AM Performed by: Clearnce Sorrel Pre-anesthesia Checklist: Patient identified, Timeout performed, Emergency Drugs available, Suction available and Patient being monitored Patient Re-evaluated:Patient Re-evaluated prior to inductionOxygen Delivery Method: Circle system utilized Preoxygenation: Pre-oxygenation with 100% oxygen Intubation Type: IV induction Ventilation: Mask ventilation without difficulty Laryngoscope Size: Mac and 3 Grade View: Grade II Tube type: Oral Tube size: 7.0 mm Number of attempts: 1 Airway Equipment and Method: Stylet Placement Confirmation: ETT inserted through vocal cords under direct vision,  breath sounds checked- equal and bilateral and positive ETCO2 Secured at: 23 cm Tube secured with: Tape Dental Injury: Teeth and Oropharynx as per pre-operative assessment

## 2015-08-10 NOTE — Op Note (Signed)
OPERATIVE REPORT  DATE OF SURGERY: 08/10/2015  PATIENT: Cathy Jones, 55 y.o. female MRN: [id number]  DOB: February 03, 1961  PRE-OPERATIVE DIAGNOSIS: Right leg ischemia  POST-OPERATIVE DIAGNOSIS:  Same  PROCEDURE: Right iliac thrombectomy, right iliac arteriogram, left to right femorofemoral bypass with 8 mm Hemashield graft  SURGEON:  Curt Jews, M.D.  Assistant Dr. Victorino Dike and Gerri Lins PA-C  ANESTHESIA:  Gen.  EBL: 100 ml  Total I/O In: 1325 [I.V.:1325] Out: 1125 [Urine:775; Blood:350]  BLOOD ADMINISTERED: None  DRAINS: None  SPECIMEN: Right groin node and right iliac thrombus  COUNTS CORRECT:  YES  PLAN OF CARE: PACU stable   PATIENT DISPOSITION:  PACU - hemodynamically stable  PROCEDURE DETAILS: The patient was taken to the operating room placed supine position where the area of the right and left groins were prepped in usual sterile fashion in the right leg was prepped in the usual sterile fashion. Incision was made over the right femoral artery and carried down to isolate the common femoral at the level of the inguinal ligament. The superficial femoral and profundus femoris arteries were encircled with Vesseloops. Patient was given 8000 units intravenous heparin and after adequate circulation time the common femoral artery was occluded at the inguinal ligament. The superficial femoral and profundus femoris arteries were occluded with Vesseloops. The artery with common femoral artery was opened with 11 blade and sent longitudinally with Potts scissors. A Fogarty catheter was passed down to the level of the distal calf and was removed with no thrombus noted distally. This was reoccluded. Catheter was also placed passed down the deep femoral artery and the right there is no thrombus removed. The Fogarty catheter was passed centrally and would not go past approximately 15 cm from the groin. Would not get to the level of the iliac bifurcation initially. Multiple passes  were undertaken and chronic thrombus was removed. There were several occasions with the catheter wouldn't go further but would not pass 30 cm felt that this wound may be coiling. C-arm was brought onto the field and a 6 [place] sheath was passed through the arteriotomy. Hand injection revealed a complete occlusion of the common iliac artery with collateralization. Further attempts at thrombectomy removed a couple small areas of chronic thrombus but again would not pass centrally. For this reason tens were abandoned at that opening this. I got wire was passed centrally and would not go past his chronic occlusion. For this reason a separate incision was made over the common femoral artery on the left. This was carried down to isolate the common superficial femoral and profundus reverse arteries. These had minimal atherosclerotic plaque and a good pulse was present in the left common femoral artery. A tunnel was created between the level of the common femoral artery the right on the left and a C configuration above the pubic bone. An 87mm Hemashield graft was brought to the tunnel. The left common superficial femoral and profundus femoris arteries were occluded. The common femoral was opened with 11 blade and tenotomy with Potts scissors. The graft was spatulated on the right and left groin and was sewn end-to-side to the right and left arteries at the junction of the common and superficial femoral arteries. This was done with running 5-0 Prolene sutures bilaterally. To completion of each anastomosis the usual flushing maneuvers were undertaken. Anastomosis was completed and flow was restored to the level of the superficial femoral arteries bilaterally. There was good Doppler flow in the superficial femoral plus femoris  arteries bilaterally. This was dramatically augmented on the right with patency of the femorofemoral versus occlusion of the femorofemoral. The patient was given 50 mg of protamine to reverse heparin.  Wounds irrigated with saline. Hemostasis tablet cautery. The wounds were closed with 2-0 Vicryl in the subcutaneous tissue in several layers. Skin was closed with 3-0 subcuticular Vicryl suture. Dermabond was placed over this. Patient was transferred to the recovery room with excellent Doppler flow in the dorsalis pedis and posterior tibial bilaterally   Curt Jews, M.D. 08/10/2015 2:58 PM

## 2015-08-10 NOTE — Progress Notes (Signed)
PT Cancellation Note  Patient Details Name: [name] MRN: [id number] DOB: May 14, 1960   Cancelled Treatment:    Reason Eval/Treat Not Completed: Patient not medically ready.  Patient in surgery today.  Will return tomorrow for PT evaluation.   Despina Pole 08/10/2015, 4:48 PM Carita Pian. Sanjuana Kava, [place] Pager [phone]

## 2015-08-10 NOTE — Progress Notes (Signed)
CONSULT FOLLOW-UP [We will continue to see patient while hospitalized]  ELBONY MCCLIMANS [id number] DOB: Mar 05, 1961 DOA: 08/07/2015 PCP: Tula Nakayama  Brief narrative: 55 y/o ? Smoker,  [place] [Cervical Ca 1994] s/p Chemo-XRT Colon obst c Distal  High-grade sigmoid strict @ 20-25 cm--[attempt by GI @ dilatation 05/2015] Severe P-EM ~ 100 lbs over 2016-2017 2/2 to stricture  3/25-hypertension, of cervical cancer, colon stricture status post partial colon resection and diverting ileostomy on 2/15 came to the ER when referred by Dr. Leighton Ruff for abnormal lab work-At that admission Dr. Diona Fanti Urology  Placed bilateral 6 Fr open-ended catheters Patient underwent on 06/27/15 PELVIC LYSIS OF ADHESIONS, LOW ANTERIOR RESECTION, DIVERTING LOOP ILEOSTOMY CYSTOSCOPY WITH STENT PLACEMENT Re-admit 3/20 with AKI, Creat 5.8, non-gap Met acidosis  2/2 to High-output Ileostomy, Desc Thor Aort thrombus + splenic infarcgts-hydrated and sent home  Admitted 08/07/15 R LE pain, found to have ischemia S/p fem fem bypass Dr. Donnetta Hutching 08/10/15 Had angiogram 08/09/15 with left common iliac stenting  Chart reviewed as below-   Consultants:  Vascular  Telephone consulted oncology regarding mixing study and hypercoagulable panel-felt not to be of consequence to current situation  Procedures: Conclusion of Angigram      Occluded right iliac system with some residual right common iliac artery segment  Left common iliac artery stenosis 75-90%: resolved after stenting  Likely mostly patent right leg arterial system (underfilled angiogram) with incomplete imaging of right posterior tibial artery  Patent left leg arterial system with some dwindling of left anterior tibial artery   Renal US IMPRESSION: Normal renal ultrasound. No hydronephrosis.   Antibiotics:  none   Subjective   Post op today from Bypass surgery as per Dr. Simeon [facility] fair     Objective    Interim  History:   Telemetry:    Objective: Filed Vitals:   08/10/15 0829 08/10/15 0830 08/10/15 1408 08/10/15 1410  BP: 107/45 99/59 128/71   Pulse: 71  82   Temp: 98.4 F (36.9 C)   98.6 F (37 C)  TempSrc: Oral     Resp: 18  10   Height:      Weight:      SpO2: 100%  100%     Intake/Output Summary (Last 24 hours) at 08/10/15 1420 Last data filed at 08/10/15 1403  Gross per 24 hour  Intake 3167.87 ml  Output   3775 ml  Net -607.13 ml    Exam:  General: EOMI NCAT pleasant no pallor no icterus Cardiovascular: S1-S2 no murmur rub or gallop Respiratory: Clinically clear no added sound Abdomen: Soft nontender nondistended no rebound no guarding Skin right lower extremity great toe slightly dusky but not painful to touch able to move toes without pain and I cannot appreciate by direct palpation dorsalis pedis nor posterior tibialis   Data Reviewed: Basic Metabolic Panel:  Recent Labs Lab 08/07/15 1819 08/08/15 0453 08/09/15 0037 08/09/15 0400 08/10/15 0101  NA 133* 137 137 140 136  K 4.6 4.2 4.2 4.1 4.5  CL 99* 107 109 113* 108  CO2 21* 19* 21* 20* 22  GLUCOSE 115* 98 125* 98 108*  BUN '17 14 10 8 6  ' CREATININE 1.84* 0.99 0.80 0.71 0.86  CALCIUM 9.3 8.9 8.4* 8.5* 8.6*   Liver Function Tests:  Recent Labs Lab 08/07/15 1819 08/09/15 0400 08/10/15 0101  AST 14* 10* 14*  ALT 10* 9* 12*  ALKPHOS 156* 124 137*  BILITOT 0.3 0.3 0.4  PROT 6.3* 5.2* 5.5*  ALBUMIN 3.0* 2.4* 2.6*   No results for input(s): LIPASE, AMYLASE in the last 168 hours. No results for input(s): AMMONIA in the last 168 hours. CBC:  Recent Labs Lab 08/07/15 1819 08/08/15 0453 08/09/15 0400 08/10/15 0101  WBC 14.9* 9.5 7.4 8.4  NEUTROABS 11.2*  --   --  5.4  HGB 9.5* 9.3* 7.7* 8.4*  HCT 30.5* 29.4* 25.4* 26.0*  MCV 83.3 82.8 83.8 83.3  PLT 283 266 255 250   Cardiac Enzymes: No results for input(s): CKTOTAL, CKMB, CKMBINDEX, TROPONINI in the last 168 hours. BNP: Invalid  input(s): POCBNP CBG: No results for input(s): GLUCAP in the last 168 hours.  Recent Results (from the past 240 hour(s))  MRSA PCR Screening     Status: None   Collection Time: 08/08/15  1:30 PM  Result Value Ref Range Status   MRSA by PCR NEGATIVE NEGATIVE Final    Comment:        The GeneXpert MRSA Assay (FDA approved for NASAL specimens only), is one component of a comprehensive MRSA colonization surveillance program. It is not intended to diagnose MRSA infection nor to guide or monitor treatment for MRSA infections.      Studies:              All Imaging reviewed and is as per above notation   Scheduled Meds: . [MAR Hold] diphenoxylate-atropine  2 tablet Oral TID AC & HS  . [MAR Hold] ferrous sulfate  325 mg Oral TID WC  . [MAR Hold] folic acid  1 mg Oral Daily  . [MAR Hold] loperamide  4 mg Oral TID AC & HS  . [MAR Hold] LORazepam  0.5 mg Oral q morning - 10a  . [MAR Hold] polycarbophil  625 mg Oral BID  . [MAR Hold] sertraline  100 mg Oral q morning - 10a  . [MAR Hold] vitamin B-12  100 mcg Oral Daily   Continuous Infusions: . sodium chloride 150 mL/hr at 08/08/15 1800  . sodium chloride    . heparin Stopped (08/10/15 1040)  . lactated ringers 35 mL/hr (08/10/15 1057)     Assessment/Plan:  1. Ischemic RLE 2/2 arterial embolus from Thoracic aorta-s/p L-->R fem-fem bypass + graft 08/10/15 -appreciate VVS input-Noted R iliac occlusion on US-per VVS-Continue heparin GTT  & transition to DOAC per VVS-given that she is not in pain and her pain has significantly improved, transition Dilaudid every 2--every 4.--Added OxyIR for pain every 3 when necessary 2. Thoracic aorta bulky Mural plaque seen on CT abd pelvis 07/30/15-defer decision making re: Anticoagulation vs op management of this to VVS 3. Recent Colonic obstruct [see above]-high output ostomy.  No strict i/o? Cont LOmotil 2 tab tid AC + HS, Loperamide 4 tid ACHS--if output >input,appreicate Dr. Drema Pry surgery]  comments 08/09/15 4. Severe P-EM-see above discussion 5. Hypovolemia + AKI 2/2 to high-output ostomy-slight non gap Met acidosis -imporved.  BUN/CREAT 17/1.84-->14/0.99-->8/0.71-->6/0.86 currently.  Continue Saline @ 150 cc/hr--cut back to 125 cc/hr 3/30 6. Bipolar-continue Sertraline 100 q am, Ativan 0.5 q am 7. Dilutional + micocytic anemia-Hb trend 9.3-->7.7.  Monitor trends, hemoccult to ensure no bleeding.  8. Prior history of cervical adeno CA status post TAH/BSO 1990s-needs surveillance as an outpatient. 9. Former smoker-needs to keep up cessation    Full code Inpatient Tele   25 min  Verneita Griffes, MD  [facility] Pager [phone] 08/10/2015, 2:20 PM    LOS: 3 days

## 2015-08-10 NOTE — Progress Notes (Signed)
Patient ID: Cathy Jones, female   DOB: 01/23/1961, 55 y.o.   MRN: [id number] Remains hemodynamically stable. Reports some numbness in her right foot. Normal motor function in her foot is actually warm. Does have palpable left dorsalis pedis pulse and no difficulty with her puncture on the left. I reviewed her arteriogram. Recommended right groin exploration with attempted thrombectomy of her right iliac artery. Also understands may require stenting of the right. Also understands may require left right femorofemoral bypass if right iliac cannot be opened. The patient understands and wished to proceed as planned planned later today

## 2015-08-10 NOTE — Anesthesia Postprocedure Evaluation (Signed)
Anesthesia Post Note  Patient: CORNELLA RUFFER  Procedure(s) Performed: Procedure(s) (LRB): BYPASS GRAFT LEFT FEMORALTO RIGHT FEMORAL ARTERY (Bilateral) THROMBECTOMY Right ILIAC ARTERY (Right) INTRA OPERATIVE ARTERIOGRAM Right Iliac Artery (Right)  Patient location during evaluation: PACU Anesthesia Type: General Level of consciousness: awake Pain management: pain level controlled Vital Signs Assessment: post-procedure vital signs reviewed and stable Respiratory status: spontaneous breathing Cardiovascular status: stable Postop Assessment: no signs of nausea or vomiting Anesthetic complications: no    Last Vitals:  Filed Vitals:   08/10/15 1530 08/10/15 1545  BP: 119/60 117/51  Pulse: 75 85  Temp:    Resp: 16 19    Last Pain:  Filed Vitals:   08/10/15 1558  PainSc: 0-No pain                 Loys Hoselton

## 2015-08-10 NOTE — Anesthesia Preprocedure Evaluation (Signed)
Anesthesia Evaluation  Patient identified by MRN, date of birth, ID band Patient awake    Reviewed: Allergy & Precautions, NPO status , Patient's Chart, lab work & pertinent test results  History of Anesthesia Complications Negative for: history of anesthetic complications  Airway Mallampati: II  TM Distance: >3 FB Neck ROM: Full    Dental  (+) Poor Dentition, Missing   Pulmonary asthma , former smoker,    breath sounds clear to auscultation       Cardiovascular hypertension, + Peripheral Vascular Disease   Rhythm:Regular     Neuro/Psych  Headaches, PSYCHIATRIC DISORDERS Anxiety Depression    GI/Hepatic Neg liver ROS, hiatal hernia, GERD  ,  Endo/Other  Morbid obesity  Renal/GU Renal disease     Musculoskeletal   Abdominal   Peds  Hematology  (+) anemia ,   Anesthesia Other Findings   Reproductive/Obstetrics                             Anesthesia Physical Anesthesia Plan  ASA: III  Anesthesia Plan: General   Post-op Pain Management:    Induction: Intravenous  Airway Management Planned: Oral ETT  Additional Equipment: None  Intra-op Plan:   Post-operative Plan: Extubation in OR  Informed Consent: I have reviewed the patients History and Physical, chart, labs and discussed the procedure including the risks, benefits and alternatives for the proposed anesthesia with the patient or authorized representative who has indicated his/her understanding and acceptance.   Dental advisory given  Plan Discussed with: CRNA and Surgeon  Anesthesia Plan Comments:         Anesthesia Quick Evaluation

## 2015-08-10 NOTE — Progress Notes (Signed)
Pt's ostomy output at 1L/24 hrs.  This is at her goal.  Cr normal with that amount.  Cont current management for bowel regimen.  Strict I&O's.  Try to keep pt off liquid diets.  Will see as needed.  Please call me with any questions or concerns.  Rosario Adie, [place] [id number] [facility]

## 2015-08-11 LAB — BASIC METABOLIC PANEL
Anion gap: 9 (ref 5–15)
CALCIUM: 8.3 mg/dL — AB (ref 8.9–10.3)
CHLORIDE: 105 mmol/L (ref 101–111)
CO2: 21 mmol/L — AB (ref 22–32)
CREATININE: 0.83 mg/dL (ref 0.44–1.00)
GFR calc non Af Amer: >60 mL/min (ref >60)
Glucose, Bld: 122 mg/dL — ABNORMAL HIGH (ref 65–99)
Potassium: 3.7 mmol/L (ref 3.5–5.1)
Sodium: 135 mmol/L (ref 135–145)

## 2015-08-11 LAB — CBC
HEMATOCRIT: 25.4 % — AB (ref 36.0–46.0)
HEMOGLOBIN: 7.8 g/dL — AB (ref 12.0–15.0)
MCH: 25.7 pg — AB (ref 26.0–34.0)
MCHC: 30.7 g/dL (ref 30.0–36.0)
MCV: 83.6 fL (ref 78.0–100.0)
Platelets: 266 10*3/uL (ref 150–400)
RBC: 3.04 MIL/uL — ABNORMAL LOW (ref 3.87–5.11)
RDW: 14.8 % (ref 11.5–15.5)
WBC: 8.2 10*3/uL (ref 4.0–10.5)

## 2015-08-11 LAB — IRON AND TIBC
Iron: 9 ug/dL — ABNORMAL LOW (ref 28–170)
Saturation Ratios: 5 % — ABNORMAL LOW (ref 10.4–31.8)
TIBC: 176 ug/dL — AB (ref 250–450)
UIBC: 167 ug/dL

## 2015-08-11 MED ORDER — APIXABAN 5 MG PO TABS
5.0000 mg | ORAL_TABLET | Freq: Two times a day (BID) | ORAL | Status: DC
  Administered 2015-08-11 – 2015-08-12 (×3): 5 mg via ORAL
  Filled 2015-08-11 (×3): qty 1

## 2015-08-11 MED ORDER — OXYCODONE HCL 5 MG PO TABS: 5.0000 mg | ORAL_TABLET | ORAL | Status: DC | PRN

## 2015-08-11 NOTE — Progress Notes (Signed)
CONSULT FOLLOW-UP [We will continue to see patient while hospitalized]  Cathy Jones DOB: 1961-04-11 DOA: 08/07/2015 PCP: Tula Nakayama  Brief narrative: 55 y/o ? Smoker,  [place] [Cervical Ca 1994] s/p Chemo-XRT Colon obst c Distal  High-grade sigmoid strict @ 20-25 cm--[attempt by GI @ dilatation 05/2015] Severe P-EM ~ 100 lbs over 2016-2017 2/2 to stricture  3/25-hypertension, of cervical cancer, colon stricture status post partial colon resection and diverting ileostomy on 2/15 came to the ER when referred by Dr. Leighton Ruff for abnormal lab work-At that admission Dr. 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 Donnetta Hutching 08/10/15 Had angiogram 08/09/15 with left common iliac stenting  Chart reviewed as below-   Consultants:  Vascular  Telephone consulted oncology regarding mixing study and hypercoagulable panel-felt not to be of consequence to current situation  Procedures: Conclusion of Angigram      Occluded right iliac system with some residual right common iliac artery segment  Left common iliac artery stenosis 75-90%: resolved after stenting  Likely mostly patent right leg arterial system (underfilled angiogram) with incomplete imaging of right posterior tibial artery  Patent left leg arterial system with some dwindling of left anterior tibial artery   Renal US IMPRESSION: Normal renal ultrasound. No hydronephrosis.   Antibiotics:  none   Subjective   Good tol diet Ostomy OP slightly up Eating drinking Ambulatory to RR No cp n/v Tells me plan of care is  ostomy reversal this summer    Objective    Interim History:   Telemetry:    Objective: Filed Vitals:   08/11/15 0036 08/11/15 0428 08/11/15 0437 08/11/15 0650  BP: 94/48 127/50 122/50   Pulse: 78 72 82   Temp:  98.5 F (36.9 C)    TempSrc:  Oral    Resp:  20  19  Height:      Weight:  89.086 kg (196 lb 6.4 oz)    SpO2: 97% 90% 100%     Intake/Output Summary (Last 24 hours) at 08/11/15 0835 Last data filed at 08/11/15 0700  Gross per 24 hour  Intake   3135 ml  Output   4180 ml  Net  -1045 ml    Exam:  General: EOMI NCAT pleasant no pallor no icterus Cardiovascular: S1-S2 no murmur rub or gallop Respiratory: Clinically clear no added sound Abdomen: Soft nontender nondistended no rebound no guarding--ostomy has loose stool in it.   Skin right lower extremity great toe slightly dusky but not painful to touch able to move toes without pain and I cannot appreciate by direct palpation dorsalis pedis nor posterior tibialis   Data Reviewed: Basic Metabolic Panel:  Recent Labs Lab 08/08/15 0453 08/09/15 0037 08/09/15 0400 08/10/15 0101 08/10/15 1539 08/11/15 0521  NA 137 137 140 136  --  135  K 4.2 4.2 4.1 4.5  --  3.7  CL 107 109 113* 108  --  105  CO2 19* 21* 20* 22  --  21*  GLUCOSE 98 125* 98 108*  --  122*  BUN '14 10 8 6  ' --  <5*  CREATININE 0.99 0.80 0.71 0.86 0.83 0.83  CALCIUM  8.9 8.4* 8.5* 8.6*  --  8.3*   Liver Function Tests:  Recent Labs Lab 08/07/15 1819 08/09/15 0400 08/10/15 0101  AST 14* 10* 14*  ALT 10* 9* 12*  ALKPHOS 156* 124 137*  BILITOT 0.3 0.3 0.4  PROT 6.3* 5.2* 5.5*  ALBUMIN 3.0* 2.4* 2.6*   No results for input(s): LIPASE, AMYLASE in the last 168 hours. No results for input(s): AMMONIA in the last 168 hours. CBC:  Recent Labs Lab 08/07/15 1819 08/08/15 0453 08/09/15 0400 08/10/15 0101 08/10/15 1539 08/11/15 0521  WBC 14.9* 9.5 7.4 8.4 8.0 8.2  NEUTROABS 11.2*  --   --  5.4  --   --   HGB 9.5* 9.3* 7.7* 8.4*  8.7* 7.8*  HCT 30.5* 29.4* 25.4* 26.0* 28.0* 25.4*  MCV 83.3 82.8 83.8 83.3 83.6 83.6  PLT 283 266 255 250 252 266   Cardiac Enzymes: No results for input(s): CKTOTAL, CKMB, CKMBINDEX, TROPONINI in the last 168 hours. BNP: Invalid input(s): POCBNP CBG: No results for input(s): GLUCAP in the last 168 hours.  Recent Results (from the past 240 hour(s))  MRSA PCR Screening     Status: None   Collection Time: 08/08/15  1:30 PM  Result Value Ref Range Status   MRSA by PCR NEGATIVE NEGATIVE Final    Comment:        The GeneXpert MRSA Assay (FDA approved for NASAL specimens only), is one component of a comprehensive MRSA colonization surveillance program. It is not intended to diagnose MRSA infection nor to guide or monitor treatment for MRSA infections.      Studies:              All Imaging reviewed and is as per above notation   Scheduled Meds: . cefUROXime (ZINACEF)  IV  1.5 g Intravenous Q12H  . diphenoxylate-atropine  2 tablet Oral TID AC & HS  . docusate sodium  100 mg Oral Daily  . enoxaparin (LOVENOX) injection  30 mg Subcutaneous Q24H  . ferrous sulfate  325 mg Oral TID WC  . folic acid  1 mg Oral Daily  . loperamide  4 mg Oral TID AC & HS  . LORazepam  0.5 mg Oral q morning - 10a  . pantoprazole  40 mg Oral Daily  . polycarbophil  625 mg Oral BID  . sertraline  100 mg Oral q morning - 10a  . vitamin B-12  100 mcg Oral Daily   Continuous Infusions: . sodium chloride    . lactated ringers 35 mL/hr (08/10/15 1057)     Assessment/Plan:  1. 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 heparin GTT  & transition to DOAC per VVS-pain management per VVS 2. Thoracic aorta bulky Mural plaque seen on CT abd pelvis 07/30/15-defer decision making re: Anticoagulation vs op management of this to VVS 3. Recent Colonic obstruct [see above]-high output ostomy.  No  strict i/o? Cont Lomotil 2 tab tid AC + HS, Loperamide 4 tid ACHS--if output >input,appreicate Dr. Drema Pry surgery] comments 08/09/15--for ostomy reversal per Dr. Marcello Moores later this summer 4. Severe P-EM-see above discussion 5. Hypovolemia + AKI 2/2 to high-output ostomy-slight non gap Met acidosis -imporved.  BUN/CREAT 17/1.84-->14/0.99-->8/0.71-->6/0.86 currently.  Continue Saline @ 150 cc/hr--cut back to 125 cc/hr 3/30--->D/c saline 08/11/15 in preparation for d/c home 6. Bipolar-continue Sertraline 100 q am, Ativan 0.5 q am 7. Dilutional + micocytic anemia-Hb trend 9.3-->7.7.  Follow iron/TOBC from 08/11/15 am--Give IV iron if saturation low prior to d/c home 4/2 8. Prior history of cervical adeno CA status post TAH/BSO 1990s-needs surveillance as an outpatient. 43. Former smoker-needs to keep up cessation    Full code Inpatient Tele dispo per VVS   25 min  Verneita Griffes, MD  [facility] Pager [phone] 08/11/2015, 8:35 AM    LOS: 4 days

## 2015-08-11 NOTE — Evaluation (Signed)
Physical Therapy Evaluation Patient Details Name: Cathy Jones MRN: [id number] DOB: [birth year] Today's Date: 08/11/2015   History of Present Illness  Patient is a 55 yo female admitted 08/07/15 with RLE pain, ischemia, artery occlusion.  Patient s/p Rt iliac thrombectomy and Lt to Rt fem-fem BPG on 08/10/15.   PMH:  colon resection, colostomy, HTN, HLD, asthma, depression, anxiety    Clinical Impression  Patient is functioning at Mod I to independent level with all mobility and gait.  Good balance with gait.  No further acute PT needs identified - PT will sign off.  Encouraged patient to continue to walk in hallway with nursing.    Follow Up Recommendations No PT follow up;Supervision - Intermittent    Equipment Recommendations  None recommended by PT    Recommendations for Other Services       Precautions / Restrictions Precautions Precautions: None Restrictions Weight Bearing Restrictions: No      Mobility  Bed Mobility Overal bed mobility: Modified Independent             General bed mobility comments: Increased time  Transfers Overall transfer level: Modified independent Equipment used: None             General transfer comment: Increased time to move sit <> stand due to pain with extension/flexion at hips  Ambulation/Gait Ambulation/Gait assistance: Supervision Ambulation Distance (Feet): 175 Feet Assistive device: None Gait Pattern/deviations: WFL(Within Functional Limits)   Gait velocity interpretation: at or above normal speed for age/gender General Gait Details: Patient with good gait pattern, balance, and speed.  Stairs            Wheelchair Mobility    Modified Rankin (Stroke Patients Only)       Balance Overall balance assessment: Independent                           High level balance activites: Direction changes;Turns;Sudden stops;Head turns High Level Balance Comments: No loss of balance with high level balance  activities             Pertinent Vitals/Pain Pain Assessment: 0-10 Pain Score: 4  Pain Location: Bil groin area and RLE Pain Descriptors / Indicators: Sore;Tender Pain Intervention(s): Monitored during session;Repositioned;Premedicated before session    Home Living Family/patient expects to be discharged to:: Private residence Living Arrangements: Spouse/significant other Available Help at Discharge: Family;Available PRN/intermittently (Husband works) Type of Home: House Home Access: Stairs to enter Entrance Stairs-Rails: None Technical brewer of Steps: 1 Home Layout: One level Home Equipment: None      Prior Function Level of Independence: Independent               Hand Dominance        Extremity/Trunk Assessment   Upper Extremity Assessment: Overall WFL for tasks assessed           Lower Extremity Assessment: Generalized weakness (Post-op decrease strength/ROM)      Cervical / Trunk Assessment: Normal  Communication   Communication: No difficulties  Cognition Arousal/Alertness: Awake/alert Behavior During Therapy: WFL for tasks assessed/performed Overall Cognitive Status: Within Functional Limits for tasks assessed                      General Comments      Exercises        Assessment/Plan    PT Assessment Patent does not need any further PT services  PT Diagnosis Generalized weakness;Acute pain   PT  Problem List    PT Treatment Interventions     PT Goals (Current goals can be found in the Care Plan section) Acute Rehab PT Goals PT Goal Formulation: All assessment and education complete, DC therapy    Frequency     Barriers to discharge        Co-evaluation               End of Session   Activity Tolerance: Patient tolerated treatment well Patient left: in bed;with call bell/phone within reach Nurse Communication: Mobility status         Time: [id number] PT Time Calculation (min) (ACUTE ONLY): 10  min   Charges:   PT Evaluation $PT Eval Low Complexity: 1 Procedure     PT G CodesDespina Pole 2015/08/20, 5:02 PM Carita Pian. Sanjuana Kava, [place] Pager [phone]

## 2015-08-11 NOTE — Progress Notes (Addendum)
[facility]  Subjective  - She is doing well over all.  She has good pain control and tolerating PO's.   Objective 122/50 82 98.5 F (36.9 C) (Oral) 19 100%  Intake/Output Summary (Last 24 hours) at 08/11/15 0747 Last data filed at 08/11/15 0439  Gross per 24 hour  Intake   2535 ml  Output   4180 ml  Net  -1645 ml    Doppler biphasic DP/PT bilaterally Incisions bilateral groins soft without hematoma 4 X 4 to groins Lungs non labored breathing Heart RRR  Assessment/Planning: POD #  UO 650 in the past 8 hour shift Cr 0.83 HGB 7.8 asymptomatic.  I will redraw labs in the am.  If she becomes symptomatic or her hgb drops we will transfuse her tomorrow. Disposition stable vascular by pass will restart Eliquis today  Possible discharge tomorrow  Laurence Slate [facility] 08/11/2015 7:47 AM --  Laboratory Lab Results:  Recent Labs  08/10/15 1539 08/11/15 0521  WBC 8.0 8.2  HGB 8.7* 7.8*  HCT 28.0* 25.4*  PLT 252 266   BMET  Recent Labs  08/10/15 0101 08/10/15 1539 08/11/15 0521  NA 136  --  135  K 4.5  --  3.7  CL 108  --  105  CO2 22  --  21*  GLUCOSE 108*  --  122*  BUN 6  --  <5*  CREATININE 0.86 0.83 0.83  CALCIUM 8.6*  --  8.3*    COAG Lab Results  Component Value Date   INR 1.17 08/10/2015   INR 1.25 08/09/2015   INR 1.20 07/31/2015   No results found for: PTT   I have examined the patient, reviewed and agree with above.Well-perfused feet bilaterally. Groin incisions without hematoma. Continue to mobilize. Probable discharge in a.m.  Curt Jews, MD 08/11/2015 10:47 AM

## 2015-08-12 ENCOUNTER — Other Ambulatory Visit: Payer: Self-pay | Admitting: *Deleted

## 2015-08-12 DIAGNOSIS — I739 Peripheral vascular disease, unspecified: Secondary | ICD-10-CM

## 2015-08-12 LAB — CBC
HEMATOCRIT: 25 % — AB (ref 36.0–46.0)
HEMOGLOBIN: 7.8 g/dL — AB (ref 12.0–15.0)
MCH: 25.8 pg — AB (ref 26.0–34.0)
MCHC: 31.2 g/dL (ref 30.0–36.0)
MCV: 82.8 fL (ref 78.0–100.0)
Platelets: 255 10*3/uL (ref 150–400)
RBC: 3.02 MIL/uL — AB (ref 3.87–5.11)
RDW: 14.5 % (ref 11.5–15.5)
WBC: 10.7 10*3/uL — ABNORMAL HIGH (ref 4.0–10.5)

## 2015-08-12 LAB — BASIC METABOLIC PANEL
Anion gap: 10 (ref 5–15)
CHLORIDE: 101 mmol/L (ref 101–111)
CO2: 23 mmol/L (ref 22–32)
Calcium: 8.6 mg/dL — ABNORMAL LOW (ref 8.9–10.3)
Creatinine, Ser: 0.87 mg/dL (ref 0.44–1.00)
GFR calc Af Amer: >60 mL/min (ref >60)
GFR calc non Af Amer: >60 mL/min (ref >60)
Glucose, Bld: 112 mg/dL — ABNORMAL HIGH (ref 65–99)
POTASSIUM: 3.5 mmol/L (ref 3.5–5.1)
SODIUM: 134 mmol/L — AB (ref 135–145)

## 2015-08-12 MED ORDER — FERUMOXYTOL INJECTION 510 MG/17 ML
510.0000 mg | Freq: Once | INTRAVENOUS | Status: AC
  Administered 2015-08-12: 510 mg via INTRAVENOUS
  Filled 2015-08-12 (×2): qty 17

## 2015-08-12 NOTE — Progress Notes (Signed)
OT Cancellation Note  Patient Details Name: Cathy Jones MRN: [id number] DOB: 13-Dec-1960   Cancelled Treatment:    Reason Eval/Treat Not Completed: OT screened, no needs identified, will sign off. Pt mod I with ADLs and mobility at this time and reports not needing therapy services.  Redmond Baseman, OTR/L Pager: [phone] 08/12/2015, 9:53 AM

## 2015-08-12 NOTE — Progress Notes (Signed)
While walking to the bathroom pt experienced burning in the ball of her R foot. When she returned to bed pulses were rechecked. R foot remains warm with pulses obtainable by doppler.Some swelling noted at ball of foot.Will notified Vascular MD on call and will continue to monitor closely.

## 2015-08-12 NOTE — Progress Notes (Addendum)
AVS clarified with Dr. Donnetta Hutching. Pt to continue Eliquis 5 mg po BID as ordered on AVS.  Pt made aware. Verbalized understanding. No questions or concerns expressed.

## 2015-08-12 NOTE — Progress Notes (Signed)
Patient d/w Dr. Donnetta Hutching early this am Get IV iron Rest as per VVS Will CC Dr . Marcello Moores for ostomy reveral planning in summer  Signing off...  Verneita Griffes, MD [facility] [phone]

## 2015-08-12 NOTE — Progress Notes (Signed)
Subjective: Interval History: none.. Reports feeling of swelling and numbness and tingling in the ball of her right foot extending into her toes. Incisional soreness in her groin is resolving  Objective: Vital signs in last 24 hours: Temp:  [98.1 F (36.7 C)-99.9 F (37.7 C)] 98.9 F (37.2 C) (04/02 0855) Pulse Rate:  [71-85] 84 (04/02 0855) Resp:  [16-18] 18 (04/02 0550) BP: (85-122)/(40-64) 106/55 mmHg (04/02 0856) SpO2:  [96 %-100 %] 99 % (04/02 0855) Weight:  [195 lb 5.2 oz (88.6 kg)] 195 lb 5.2 oz (88.6 kg) (04/02 0500)  Intake/Output from previous day: 04/01 0701 - 04/02 0700 In: 1740 [P.O.:1640; IV Piggyback:100] Out: 4790 [Urine:3300; Stool:1490] Intake/Output this shift:    Groin incisions healing with no erythema and no hematoma. Both feet well-perfused. No swelling noted in right foot. Excellent biphasic dorsalis pedis and posterior tibial flow in the right and left foot. Actually better in the right than the left.  Lab Results:  Recent Labs  08/11/15 0521 08/12/15 0550  WBC 8.2 10.7*  HGB 7.8* 7.8*  HCT 25.4* 25.0*  PLT 266 255   BMET  Recent Labs  08/11/15 0521 08/12/15 0550  NA 135 134*  K 3.7 3.5  CL 105 101  CO2 21* 23  GLUCOSE 122* 112*  BUN <5* <5*  CREATININE 0.83 0.87  CALCIUM 8.3* 8.6*    Studies/Results: Ct Abdomen Pelvis W Contrast  07/30/2015  CLINICAL DATA:  55 year old female with nausea and vomiting for 1 week. Unintentional 50 lbs. weight loss. Mid abdominal pain. History of History of cervical cancer status post hysterectomy. More recent history of short-segment sigmoid colon stricture treated surgically. EXAM: CT ABDOMEN AND PELVIS WITH CONTRAST TECHNIQUE: Multidetector CT imaging of the abdomen and pelvis was performed using the standard protocol following bolus administration of intravenous contrast. CONTRAST:  168mL ISOVUE-300 IOPAMIDOL (ISOVUE-300) INJECTION 61% COMPARISON:  CT virtual colonoscopy 07/19/2014. CT Abdomen and Pelvis  06/13/2014. FINDINGS: Lung bases are stable since 2016 and negative. No pericardial or pleural effusion. Stable mild elevation of the left hemidiaphragm. Stable visualized osseous structures. L3 limbus vertebra. Chronic multilevel vertebral endplate irregularity most pronounced at L4. No acute or suspicious osseous lesion identified. Since March 2016 the patient has undergone ventral abdominal surgery with right abdominal ileostomy. There is new confluent presacral stranding. Chronic retroperitoneal and pelvic sidewall dissection with numerous surgical clips otherwise appears stable. Evidence of a sigmoid colectomy with staple line at what appears to be a sigmoid to rectum anastomosis (series 3, image 69). Along the right pelvic sidewall lateral to the apparent anastomosis there is an 18 x 42 mm fluid collection or less likely residual fluid-filled bowel loop (series 3, image 68). The uterus and adnexa are chronically surgically absent. There is a small volume of gas within the vagina. The urinary bladder is completely decompressed today. The left colon is decompressed. The transverse colon is decompressed. The right colon is decompressed. The distal small bowel is decompressed. The ileostomy has no adverse features there. No dilated small bowel loops. Is mild fluid distension of the stomach no pneumoperitoneum or abdominal free fluid. Liver, gallbladder, pancreas and adrenal glands are within normal limits. Portal venous system is patent. The spleen is remarkable for a mildly lobulated but wedge-shaped and peripheral hypodense area along the posterior margin which may be new since 2016. No surrounding inflammation. More subtle peripheral wedge-shaped hypodense area also on image 19 along the anterior contour, and image 12 at the superior pole of the spleen. No left upper  quadrant fluid. Extensive Aortoiliac calcified atherosclerosis noted. Major arterial structures appear to remain patent. However, there is newly  seen low-density bulky mural plaque or thrombus along the posterior wall of the descending thoracic aorta measuring 6 x 11 x 10 mm (AP by transverse by CC), new since 2016. Stable residual small retroperitoneal lymph nodes. Bilateral renal enhancement is symmetric and within normal limits. However, despite up to 7 minute delayed imaging through the kidneys no renal contrast excretion occurred. However, at the same time there is no hydronephrosis or hydroureter. Both proximal ureters are diminutive. Both track into the postoperative retroperitoneal changes an are poorly delineated distally. There is no perinephric stranding. The interval postoperative changes to the ventral abdominal wall are noted with no adverse features. IMPRESSION: 1. Evidence of multiple small splenic infarcts. Associated new soft mural plaque or thrombus in the descending thoracic aorta (series 3, image 3) suspicious for embolic source. Recommend vascular surgery consultation. 2. Interval postoperative changes to the pelvis, including apparent partial sigmoidectomy and reanastomosis. There is an elongated extraluminal fluid collection beginning posterior to the anastomosis site (in the midline presacral space) and tracking along the posterior right pelvic sidewall. This is of unclear etiology and significance. 3. Suspect acute renal insufficiency on the basis of intrinsic renal disease suspected in light of no contrast excretion to from the kidneys up to 7 minutes following injection. No evidence of obstructive uropathy. 4. New right abdominal ileostomy with no bowel obstruction. The above was discussed by telephone with Dr. Leighton Ruff on [id number] at 15:14 . Electronically Signed   By: Genevie Ann M.D.   On: 07/30/2015 15:15   US Renal  07/31/2015  CLINICAL DATA:  Acute renal failure.  Hypertension. EXAM: RENAL / URINARY TRACT ULTRASOUND COMPLETE COMPARISON:  None. FINDINGS: Right Kidney: Length: 10.1 cm. Echogenicity within normal limits.  No mass or hydronephrosis visualized. Left Kidney: Length: 11.1 cm. Echogenicity within normal limits. No mass or hydronephrosis visualized. Bladder: Appears normal for degree of bladder distention. IMPRESSION: Normal renal ultrasound.  No hydronephrosis. Electronically Signed   By: Suzy Bouchard M.D.   On: 07/31/2015 08:43   Dg Abd 2 Views  08/10/2015  CLINICAL DATA:  55 year old female undergoing intraoperative arteriogram EXAM: DG C-ARM 61-120 MIN; ABDOMEN - 2 VIEW COMPARISON:  CT abdomen/ pelvis 07/30/2015 FINDINGS: A cine loop demonstrates a vascular sheath in the common femoral artery. Contrast injection demonstrates a patent common femoral artery. The external iliac artery is occluded in the mid pelvis. Filling of internal iliac artery branches is noted via deep circumflex iliac collaterals. The inferior epigastric artery is widely patent. Surgical clips along the right pelvic sidewall suggest prior nodal dissection. The second image demonstrates a wire along the expected path of the external iliac artery. IMPRESSION: Intraoperative arteriogram as above. 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 Electronically Signed   By: Jacqulynn Cadet M.D.   On: 08/10/2015 15:34   Dg C-arm 1-60 Min  08/10/2015  CLINICAL DATA:  55 year old female undergoing intraoperative arteriogram EXAM: DG C-ARM 61-120 MIN; ABDOMEN - 2 VIEW COMPARISON:  CT abdomen/ pelvis 07/30/2015 FINDINGS: A cine loop demonstrates a vascular sheath in the common femoral artery. Contrast injection demonstrates a patent common femoral artery. The external iliac artery is occluded in the mid pelvis. Filling of internal iliac artery branches is noted via deep circumflex iliac collaterals. The inferior epigastric artery is widely patent. Surgical clips along the right pelvic sidewall suggest prior nodal dissection. The second image  demonstrates a wire along the expected path of the external iliac artery. IMPRESSION:  Intraoperative arteriogram as above. 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 Electronically Signed   By: Jacqulynn Cadet M.D.   On: 08/10/2015 15:34   Anti-infectives: Anti-infectives    Start     Dose/Rate Route Frequency Ordered Stop   08/10/15 2200  cefUROXime (ZINACEF) 1.5 g in dextrose 5 % 50 mL IVPB     1.5 g 100 mL/hr over 30 Minutes Intravenous Every 12 hours 08/10/15 1519 08/11/15 1057   08/10/15 1100  cefUROXime (ZINACEF) 1.5 g in dextrose 5 % 50 mL IVPB     1.5 g 100 mL/hr over 30 Minutes Intravenous To ShortStay Surgical 08/09/15 1723 08/10/15 1150      Assessment/Plan: s/p Procedure(s): BYPASS GRAFT LEFT FEMORALTO RIGHT FEMORAL ARTERY (Bilateral) THROMBECTOMY Right ILIAC ARTERY (Right) INTRA OPERATIVE ARTERIOGRAM Right Iliac Artery (Right) Stable overall. Okay for discharge. Explained that this numbness is related to approximately 2 weeks of moderate to severe ischemia in her right foot. Explained this is related to peripheral nerve injury. Explained there is no specific treatment and this should resolve with time. We'll continue to monitor this as an outpatient. Will be discharged home today and will resume oral anticoagulation.   LOS: 5 days   Curt Jews 08/12/2015, 9:37 AM

## 2015-08-13 ENCOUNTER — Encounter (HOSPITAL_COMMUNITY): Payer: Self-pay | Admitting: Vascular Surgery

## 2015-08-13 LAB — FACTOR 5 LEIDEN

## 2015-08-15 ENCOUNTER — Telehealth: Payer: Self-pay | Admitting: Vascular Surgery

## 2015-08-15 LAB — PROTHROMBIN GENE MUTATION

## 2015-08-15 NOTE — Telephone Encounter (Signed)
-----   Message from Mena Goes, RN sent at 08/13/2015  3:04 PM EDT ----- Regarding: RE: schedule I think it is just ABI but I will ask him to clarify this tomorrow.  Thanks. ----- Message -----    From: Georgiann Mccoy    Sent: 08/13/2015   2:31 PM      To: Mena Goes, RN Subject: RE: schedule                                   By " lower extremity ankle arm index", does he mean an ABI? Or and ABI + Le Art?  ----- Message -----    From: Mena Goes, RN    Sent: 08/12/2015   6:04 PM      To: Mignon [place] Pool Subject: schedule                                         ----- Message -----    From: Rosetta Posner, MD    Sent: 08/12/2015   9:40 AM      To: Vvs Charge Pool  Needs a 2 week office follow-up with me. Needs lower extremity ankle arm index

## 2015-08-15 NOTE — Telephone Encounter (Signed)
Could only fit the ABI on 5/17. Because they are a new pt I spoke to referring md's office and informed them they will need to send over a separate order for [place] to do the test. Faxed over an order form to [phone] attn: Claiborne Billings.  Lm on cell# to inform pt of ABI appt.

## 2015-08-17 ENCOUNTER — Encounter: Payer: Self-pay | Admitting: Vascular Surgery

## 2015-08-22 ENCOUNTER — Inpatient Hospital Stay (HOSPITAL_COMMUNITY)
Admission: EM | Admit: 2015-08-22 | Discharge: 2015-08-25 | DRG: 683 | Disposition: A | Discharge status: Home Health | Payer: BLUE CROSS/BLUE SHIELD | Attending: Internal Medicine | Admitting: Internal Medicine

## 2015-08-22 ENCOUNTER — Encounter (HOSPITAL_COMMUNITY): Payer: Self-pay | Admitting: Emergency Medicine

## 2015-08-22 DIAGNOSIS — J45909 Unspecified asthma, uncomplicated: Secondary | ICD-10-CM | POA: Diagnosis present

## 2015-08-22 DIAGNOSIS — Z8249 Family history of ischemic heart disease and other diseases of the circulatory system: Secondary | ICD-10-CM

## 2015-08-22 DIAGNOSIS — Z8541 Personal history of malignant neoplasm of cervix uteri: Secondary | ICD-10-CM

## 2015-08-22 DIAGNOSIS — Z7901 Long term (current) use of anticoagulants: Secondary | ICD-10-CM

## 2015-08-22 DIAGNOSIS — R198 Other specified symptoms and signs involving the digestive system and abdomen: Secondary | ICD-10-CM

## 2015-08-22 DIAGNOSIS — D519 Vitamin B12 deficiency anemia, unspecified: Secondary | ICD-10-CM | POA: Diagnosis present

## 2015-08-22 DIAGNOSIS — I739 Peripheral vascular disease, unspecified: Secondary | ICD-10-CM | POA: Diagnosis present

## 2015-08-22 DIAGNOSIS — Z79899 Other long term (current) drug therapy: Secondary | ICD-10-CM

## 2015-08-22 DIAGNOSIS — N179 Acute kidney failure, unspecified: Secondary | ICD-10-CM | POA: Diagnosis present

## 2015-08-22 DIAGNOSIS — Z885 Allergy status to narcotic agent status: Secondary | ICD-10-CM | POA: Diagnosis not present

## 2015-08-22 DIAGNOSIS — Z86010 Personal history of colonic polyps: Secondary | ICD-10-CM

## 2015-08-22 DIAGNOSIS — R42 Dizziness and giddiness: Secondary | ICD-10-CM | POA: Diagnosis present

## 2015-08-22 DIAGNOSIS — I745 Embolism and thrombosis of iliac artery: Secondary | ICD-10-CM | POA: Diagnosis present

## 2015-08-22 DIAGNOSIS — F1290 Cannabis use, unspecified, uncomplicated: Secondary | ICD-10-CM | POA: Diagnosis present

## 2015-08-22 DIAGNOSIS — I7410 Embolism and thrombosis of unspecified parts of aorta: Secondary | ICD-10-CM | POA: Diagnosis present

## 2015-08-22 DIAGNOSIS — I7409 Other arterial embolism and thrombosis of abdominal aorta: Secondary | ICD-10-CM | POA: Diagnosis present

## 2015-08-22 DIAGNOSIS — E871 Hypo-osmolality and hyponatremia: Secondary | ICD-10-CM | POA: Diagnosis present

## 2015-08-22 DIAGNOSIS — F329 Major depressive disorder, single episode, unspecified: Secondary | ICD-10-CM | POA: Diagnosis present

## 2015-08-22 DIAGNOSIS — R112 Nausea with vomiting, unspecified: Secondary | ICD-10-CM

## 2015-08-22 DIAGNOSIS — Z90710 Acquired absence of both cervix and uterus: Secondary | ICD-10-CM | POA: Diagnosis not present

## 2015-08-22 DIAGNOSIS — F419 Anxiety disorder, unspecified: Secondary | ICD-10-CM | POA: Diagnosis present

## 2015-08-22 DIAGNOSIS — A419 Sepsis, unspecified organism: Secondary | ICD-10-CM

## 2015-08-22 DIAGNOSIS — Z87891 Personal history of nicotine dependence: Secondary | ICD-10-CM | POA: Diagnosis not present

## 2015-08-22 DIAGNOSIS — D529 Folate deficiency anemia, unspecified: Secondary | ICD-10-CM | POA: Diagnosis present

## 2015-08-22 DIAGNOSIS — K219 Gastro-esophageal reflux disease without esophagitis: Secondary | ICD-10-CM | POA: Diagnosis present

## 2015-08-22 DIAGNOSIS — E872 Acidosis: Secondary | ICD-10-CM | POA: Diagnosis present

## 2015-08-22 DIAGNOSIS — I10 Essential (primary) hypertension: Secondary | ICD-10-CM | POA: Diagnosis present

## 2015-08-22 DIAGNOSIS — E785 Hyperlipidemia, unspecified: Secondary | ICD-10-CM | POA: Diagnosis present

## 2015-08-22 DIAGNOSIS — D72829 Elevated white blood cell count, unspecified: Secondary | ICD-10-CM | POA: Diagnosis present

## 2015-08-22 DIAGNOSIS — Z932 Ileostomy status: Secondary | ICD-10-CM | POA: Diagnosis not present

## 2015-08-22 DIAGNOSIS — E782 Mixed hyperlipidemia: Secondary | ICD-10-CM | POA: Diagnosis present

## 2015-08-22 DIAGNOSIS — E860 Dehydration: Secondary | ICD-10-CM | POA: Diagnosis present

## 2015-08-22 LAB — BASIC METABOLIC PANEL
ANION GAP: 17 — AB (ref 5–15)
BUN: 73 mg/dL — ABNORMAL HIGH (ref 6–20)
CALCIUM: 10.2 mg/dL (ref 8.9–10.3)
CO2: 16 mmol/L — ABNORMAL LOW (ref 22–32)
CREATININE: 3.95 mg/dL — AB (ref 0.44–1.00)
Chloride: 90 mmol/L — ABNORMAL LOW (ref 101–111)
GFR, EST AFRICAN AMERICAN: 14 mL/min — AB (ref >60)
GFR, EST NON AFRICAN AMERICAN: 12 mL/min — AB (ref >60)
Glucose, Bld: 177 mg/dL — ABNORMAL HIGH (ref 65–99)
Potassium: 4.8 mmol/L (ref 3.5–5.1)
SODIUM: 123 mmol/L — AB (ref 135–145)

## 2015-08-22 LAB — CBC
HCT: 40.8 % (ref 36.0–46.0)
Hemoglobin: 13.9 g/dL (ref 12.0–15.0)
MCH: 26.6 pg (ref 26.0–34.0)
MCHC: 34.1 g/dL (ref 30.0–36.0)
MCV: 78.2 fL (ref 78.0–100.0)
PLATELETS: 443 10*3/uL — AB (ref 150–400)
RBC: 5.22 MIL/uL — AB (ref 3.87–5.11)
RDW: 14.7 % (ref 11.5–15.5)
WBC: 32.1 10*3/uL — AB (ref 4.0–10.5)

## 2015-08-22 LAB — CBG MONITORING, ED: Glucose-Capillary: 228 mg/dL — ABNORMAL HIGH (ref 65–99)

## 2015-08-22 LAB — I-STAT CG4 LACTIC ACID, ED: LACTIC ACID, VENOUS: 0.94 mmol/L (ref 0.5–2.0)

## 2015-08-22 MED ORDER — SODIUM CHLORIDE 0.9 % IV BOLUS (SEPSIS)
1000.0000 mL | Freq: Once | INTRAVENOUS | Status: AC
  Administered 2015-08-22: 1000 mL via INTRAVENOUS

## 2015-08-22 MED ORDER — ONDANSETRON HCL 4 MG/2ML IJ SOLN
4.0000 mg | Freq: Once | INTRAMUSCULAR | Status: AC
Start: 2015-08-22 — End: 2015-08-22
  Administered 2015-08-22: 4 mg via INTRAVENOUS
  Filled 2015-08-22: qty 2

## 2015-08-22 MED ORDER — PANTOPRAZOLE SODIUM 40 MG IV SOLR
40.0000 mg | INTRAVENOUS | Status: DC
  Administered 2015-08-22 – 2015-08-23 (×2): 40 mg via INTRAVENOUS
  Filled 2015-08-22 (×2): qty 40

## 2015-08-22 MED ORDER — SODIUM CHLORIDE 0.9 % IV SOLN: 1000.0000 mL | INTRAVENOUS | Status: DC

## 2015-08-22 MED ORDER — ONDANSETRON HCL 4 MG/2ML IJ SOLN
4.0000 mg | Freq: Four times a day (QID) | INTRAMUSCULAR | Status: DC | PRN
  Administered 2015-08-23 (×2): 4 mg via INTRAVENOUS
  Filled 2015-08-22 (×2): qty 2

## 2015-08-22 MED ORDER — VANCOMYCIN HCL IN DEXTROSE 1-5 GM/200ML-% IV SOLN
1000.0000 mg | Freq: Once | INTRAVENOUS | Status: AC
  Administered 2015-08-22: 1000 mg via INTRAVENOUS
  Filled 2015-08-22: qty 200

## 2015-08-22 MED ORDER — PIPERACILLIN-TAZOBACTAM 3.375 G IVPB 30 MIN
3.3750 g | Freq: Once | INTRAVENOUS | Status: AC
  Administered 2015-08-22: 3.375 g via INTRAVENOUS
  Filled 2015-08-22: qty 50

## 2015-08-22 MED ORDER — SODIUM CHLORIDE 0.9 % IV BOLUS (SEPSIS)
1000.0000 mL | INTRAVENOUS | Status: AC
  Administered 2015-08-22: 1000 mL via INTRAVENOUS

## 2015-08-22 NOTE — ED Provider Notes (Signed)
CSN: [id number]     Arrival date & time 08/22/15  1948 History   First MD Initiated Contact with Patient 08/22/15 2001     Chief Complaint  Patient presents with  . Near Syncope    HPI  55 years old female with PMHx of HTN, colonic stricture, iliac artery occlusion, cervical cancer and renal failure presenting with nausea and near-syncope. Pt was seen by her surgeon's office today and instructed to come to ED to be admitted to medicine to stabilize her electrolyte abnormalities and control her nausea. She is also noted to have a leukocytosis and increased output from her ileostomy. She states that she has been nauseous for 3 days. She has only been able to keep fluids down for a short period of time before she vomits again. States she has been unable to keep any solids down. She went to the surgeon's office today who instructed her to come to ED for admission. She visited [facility] for blood work prior to arrival. She was walking back to her room when she became acutely nauseous and lightheaded. She felt that she is going to faint and had to be assisted to a wheelchair. She states that she no longer feels lightheaded but is still slightly nauseous. She denies associated chest pain or shortness of breath. She has no pain complaints currently. Denies fevers, chills, headache, vision changes, abdominal pain, dysuria or redness of the skin around her ostomy site.  Past Medical History  Diagnosis Date  . Hypertension   . Hyperlipidemia   . Asthma     pt brought her proair inhaler 06-06-14  . Colon polyp     hyperplastic  . Colon stricture ([place])   . Colonic obstruction ([place])   . Depression   . History of hiatal hernia   . Family history of adverse reaction to anesthesia     pts mother experiences nausea and vomiting   . Shortness of breath dyspnea     seasonal   . Anemia   . GERD (gastroesophageal reflux disease)   . Migraine     "q couple years maybe" (08/07/2015)  . Anxiety   . Acute renal  failure (ARF) ([place]) 08/07/2015  . Cervical cancer ([place])   . Iliac artery occlusion, right ([place]) 08/02/2015   Past Surgical History  Procedure Laterality Date  . Colonoscopy    . Upper gastrointestinal endoscopy    . Tubal ligation    . Colon resection N/A 06/27/2015    Procedure: LYSIS OF ADHESIONS LOW ANTERIOR RESECTION DIVERTING LOOP ILEOSTOMY;  Surgeon: Leighton Ruff, MD;  Location: [facility];  Service: General;  Laterality: N/A;  . Diverting ileostomy N/A 06/27/2015    Procedure: DIVERTING LOOP ILEOSTOMY;  Surgeon: Leighton Ruff, MD;  Location: [facility];  Service: General;  Laterality: N/A;  . Cystoscopy with stent placement Bilateral 06/27/2015    Procedure: CYSTOSCOPY WITH CATHETER  PLACEMENT;  Surgeon: Franchot Gallo, MD;  Location: [facility];  Service: Urology;  Laterality: Bilateral;  . Colon surgery    . Abdominal hysterectomy  1994  . Peripheral vascular catheterization N/A 08/09/2015    Procedure: Abdominal Aortogram w/Lower Extremity;  Surgeon: Conrad Houston, MD;  Location: [facility];  Service: Cardiovascular;  Laterality: N/A;  . Peripheral vascular catheterization Left 08/09/2015    Procedure: Peripheral Vascular Intervention;  Surgeon: Conrad Acomita Lake, MD;  Location: [facility];  Service: Cardiovascular;  Laterality: Left;  common iliac  . Femoral-femoral bypass graft Bilateral 08/10/2015  Procedure: BYPASS GRAFT LEFT FEMORALTO RIGHT FEMORAL ARTERY;  Surgeon: Rosetta Posner, MD;  Location: [facility];  Service: Vascular;  Laterality: Bilateral;  . Thrombectomy iliac artery Right 08/10/2015    Procedure: THROMBECTOMY Right ILIAC ARTERY;  Surgeon: Rosetta Posner, MD;  Location: [facility];  Service: Vascular;  Laterality: Right;  . Intraoperative arteriogram Right 08/10/2015    Procedure: INTRA OPERATIVE ARTERIOGRAM Right Iliac Artery;  Surgeon: Rosetta Posner, MD;  Location: [facility];  Service: Vascular;  Laterality: Right;   Family History  Problem Relation Age of Onset  . Colon cancer Neg  Hx   . Stomach cancer Neg Hx   . Esophageal cancer Neg Hx   . Deep vein thrombosis Neg Hx   . Rectal cancer Cousin   . Hypertension Mother   . Hyperparathyroidism Mother   . Hypertension Father   . CAD Father     CABG  . Peripheral vascular disease Father   . Graves' disease Father    Social History  Substance Use Topics  . Smoking status: Former Smoker -- 1.00 packs/day for 40 years    Types: Cigarettes    Quit date: 05/09/2015  . Smokeless tobacco: Never Used  . Alcohol Use: Yes     Comment: 08/07/2015 "nothing since early 2016"   OB History    No data available     Review of Systems  All other systems reviewed and are negative.     Allergies  Codeine  Home Medications   Prior to Admission medications   Medication Sig Start Date End Date Taking? Authorizing Provider  apixaban (ELIQUIS) 5 MG TABS tablet Take 1 tablet (5 mg total) by mouth 2 (two) times daily. 08/08/15  Yes Debbe Odea, MD  atorvastatin (LIPITOR) 10 MG tablet Take 10 mg by mouth every morning.   Yes Historical Provider, MD  diphenoxylate-atropine (LOMOTIL) 2.5-0.025 MG tablet Take 2 tablets by mouth 4 (four) times daily -  before meals and at bedtime. 08/04/15  Yes Michael Boston, MD  ferrous sulfate 325 (65 FE) MG tablet Take 1 tablet (325 mg total) by mouth 2 (two) times daily with a meal. 08/04/15  Yes Michael Boston, MD  folic acid (FOLVITE) 1 MG tablet Take 1 tablet (1 mg total) by mouth daily. 08/04/15  Yes Debbe Odea, MD  ibuprofen (ADVIL,MOTRIN) 200 MG tablet Take 600 mg by mouth every 6 (six) hours as needed for moderate pain.   Yes Historical Provider, MD  loperamide (IMODIUM) 2 MG capsule Take 2 capsules (4 mg total) by mouth 4 (four) times daily -  before meals and at bedtime. 08/04/15  Yes Michael Boston, MD  LORazepam (ATIVAN) 0.5 MG tablet Take 0.5 mg by mouth every morning.   Yes Historical Provider, MD  oxyCODONE (OXY IR/ROXICODONE) 5 MG immediate release tablet Take 1 tablet (5 mg total) by  mouth every 4 (four) hours as needed for moderate pain. 08/11/15  Yes Ulyses Amor, PA-C  polycarbophil (FIBERCON) 625 MG tablet Take 1 tablet (625 mg total) by mouth 2 (two) times daily. 08/04/15  Yes Michael Boston, MD  PROAIR HFA 108 (90 BASE) MCG/ACT inhaler Inhale 1-2 puffs into the lungs every 4 (four) hours as needed for wheezing or shortness of breath.  05/06/14  Yes Historical Provider, MD  sertraline (ZOLOFT) 100 MG tablet Take one tablet by mouth every morning. 02/19/15  Yes Historical Provider, MD  vitamin B-12 1000 MCG tablet Take 0.5 tablets (500 mcg total) by mouth daily. 08/04/15  Yes Debbe Odea, MD  nystatin (MYCOSTATIN/NYSTOP) 100000 UNIT/GM POWD Apply under pannus TID Patient not taking: Reported on 08/22/2015 08/04/15   Debbe Odea, MD  paregoric 2 MG/5ML solution Take 5-10 mLs by mouth every 6 (six) hours as needed for diarrhea or loose stools (Give if > 378mL q6h). Patient not taking: Reported on 08/22/2015 08/04/15   Debbe Odea, MD   BP 130/82 mmHg  Pulse 86  Temp(Src) 97.8 F (36.6 C) (Oral)  Resp 18  SpO2 99% Physical Exam  Constitutional: She appears well-developed and well-nourished. No distress.  Ill-appearing  HENT:  Head: Normocephalic and atraumatic.  Mouth/Throat: Oropharynx is clear and moist.  Eyes: Conjunctivae are normal. Right eye exhibits no discharge. Left eye exhibits no discharge. No scleral icterus.  Neck: Normal range of motion. Neck supple.  Cardiovascular: Normal rate, regular rhythm and normal heart sounds.   Pulmonary/Chest: Effort normal and breath sounds normal. No respiratory distress. She has no wheezes.  Abdominal: Soft. She exhibits no distension. There is no tenderness. There is no rebound and no guarding.  Right sided ileostomy. No erythema surrounding the ostomy site. Abdomen is soft and non-tender. Well-healed midline surgical scars noted.  Musculoskeletal: Normal range of motion.  Neurological: She is alert. Coordination normal.   Skin: Skin is warm and dry.  Psychiatric: She has a normal mood and affect. Her behavior is normal.  Nursing note and vitals reviewed.   ED Course  Procedures (including critical care time) Labs Review Labs Reviewed  BASIC METABOLIC PANEL - Abnormal; Notable for the following:    Sodium 123 (*)    Chloride 90 (*)    CO2 16 (*)    Glucose, Bld 177 (*)    BUN 73 (*)    Creatinine, Ser 3.95 (*)    GFR calc non Af Amer 12 (*)    GFR calc Af Amer 14 (*)    Anion gap 17 (*)    All other components within normal limits  CBC - Abnormal; Notable for the following:    WBC 32.1 (*)    RBC 5.22 (*)    Platelets 443 (*)    All other components within normal limits  CBG MONITORING, ED - Abnormal; Notable for the following:    Glucose-Capillary 228 (*)    All other components within normal limits  GASTROINTESTINAL PANEL BY PCR, STOOL (REPLACES STOOL CULTURE)  CULTURE, BLOOD (ROUTINE X 2)  CULTURE, BLOOD (ROUTINE X 2)  MRSA PCR SCREENING  URINALYSIS, ROUTINE W REFLEX MICROSCOPIC (NOT AT [facility])  I-STAT CG4 LACTIC ACID, ED  I-STAT CG4 LACTIC ACID, ED    Imaging Review No results found. I have personally reviewed and evaluated these images and lab results as part of my medical decision-making.   EKG Interpretation None      MDM   Final diagnoses:  Non-intractable vomiting with nausea, vomiting of unspecified type  Hyponatremia  Leukocytosis   55 year old female presenting for admission from her surgeon's office for electrolyte imbalances, leukocytosis, intractable nausea and vomiting and increased output at her ileostomy. Upon arrival to ED, patient became acutely nauseous and lightheaded and almost fell to the ground. This sensation resolved upon resting on the stretcher. Afebrile and hemodynamically stable. Patient is ill-appearing. Ileostomy noted to right abdomen. Abdomen is soft, nontender without peritoneal signs. No blood cell count 32. Hemoglobin stable. Sodium 123 and  chloride 90. Creatinine 3.95 which is not her baseline. BUN 73. Started on 3 L bolus IV fluids and broad-spectrum antibiotics. Surgery is  aware that patient is here and will follow up with her tomorrow. Consulted hospitalist for admission to stepdown. Dr. Olevia Bowens accepting.    Lahoma Crocker Yordin Rhoda, PA-C 08/23/15 0032  Charlesetta Shanks, MD 08/23/15 [phone]

## 2015-08-22 NOTE — ED Notes (Signed)
Hospitalist at bedside 

## 2015-08-22 NOTE — ED Notes (Signed)
Patient states she was being directly admitted for N/V, elevated WBC. Reports she became lightheaded while walking and had near syncopal episode. Denies LOC, denies hitting. C/o nausea. Denies pain, fever.

## 2015-08-22 NOTE — ED Notes (Signed)
Tech is drawing labs

## 2015-08-22 NOTE — ED Notes (Signed)
PA at bedside.

## 2015-08-23 ENCOUNTER — Inpatient Hospital Stay (HOSPITAL_COMMUNITY): Payer: BLUE CROSS/BLUE SHIELD

## 2015-08-23 ENCOUNTER — Encounter (HOSPITAL_COMMUNITY): Payer: Self-pay | Admitting: Internal Medicine

## 2015-08-23 DIAGNOSIS — E860 Dehydration: Secondary | ICD-10-CM | POA: Diagnosis present

## 2015-08-23 DIAGNOSIS — N179 Acute kidney failure, unspecified: Principal | ICD-10-CM

## 2015-08-23 LAB — HEPARIN LEVEL (UNFRACTIONATED): Heparin Unfractionated: 0.77 IU/mL — ABNORMAL HIGH (ref 0.30–0.70)

## 2015-08-23 LAB — URINALYSIS, ROUTINE W REFLEX MICROSCOPIC
Bilirubin Urine: NEGATIVE
Glucose, UA: NEGATIVE mg/dL
HGB URINE DIPSTICK: NEGATIVE
Ketones, ur: NEGATIVE mg/dL
NITRITE: NEGATIVE
Protein, ur: NEGATIVE mg/dL
SPECIFIC GRAVITY, URINE: 1.014 (ref 1.005–1.030)
pH: 5 (ref 5.0–8.0)

## 2015-08-23 LAB — CBC WITH DIFFERENTIAL/PLATELET
Basophils Absolute: 0 10*3/uL (ref 0.0–0.1)
Basophils Relative: 0 %
EOS ABS: 0.2 10*3/uL (ref 0.0–0.7)
Eosinophils Relative: 1 %
HEMATOCRIT: 31.9 % — AB (ref 36.0–46.0)
HEMOGLOBIN: 10.7 g/dL — AB (ref 12.0–15.0)
LYMPHS ABS: 3.5 10*3/uL (ref 0.7–4.0)
LYMPHS PCT: 15 %
MCH: 26.4 pg (ref 26.0–34.0)
MCHC: 33.5 g/dL (ref 30.0–36.0)
MCV: 78.6 fL (ref 78.0–100.0)
MONOS PCT: 7 %
Monocytes Absolute: 1.7 10*3/uL — ABNORMAL HIGH (ref 0.1–1.0)
NEUTROS PCT: 77 %
Neutro Abs: 17.8 10*3/uL — ABNORMAL HIGH (ref 1.7–7.7)
Platelets: 314 10*3/uL (ref 150–400)
RBC: 4.06 MIL/uL (ref 3.87–5.11)
RDW: 14.7 % (ref 11.5–15.5)
WBC: 23.2 10*3/uL — AB (ref 4.0–10.5)

## 2015-08-23 LAB — COMPREHENSIVE METABOLIC PANEL
ALK PHOS: 189 U/L — AB (ref 38–126)
ALT: 12 U/L — AB (ref 14–54)
ANION GAP: 16 — AB (ref 5–15)
AST: 13 U/L — ABNORMAL LOW (ref 15–41)
Albumin: 3.3 g/dL — ABNORMAL LOW (ref 3.5–5.0)
BILIRUBIN TOTAL: 0.6 mg/dL (ref 0.3–1.2)
BUN: 65 mg/dL — ABNORMAL HIGH (ref 6–20)
CALCIUM: 8.7 mg/dL — AB (ref 8.9–10.3)
CO2: 17 mmol/L — AB (ref 22–32)
CREATININE: 2.95 mg/dL — AB (ref 0.44–1.00)
Chloride: 91 mmol/L — ABNORMAL LOW (ref 101–111)
GFR, EST AFRICAN AMERICAN: 20 mL/min — AB (ref >60)
GFR, EST NON AFRICAN AMERICAN: 17 mL/min — AB (ref >60)
Glucose, Bld: 124 mg/dL — ABNORMAL HIGH (ref 65–99)
Potassium: 4.2 mmol/L (ref 3.5–5.1)
SODIUM: 124 mmol/L — AB (ref 135–145)
TOTAL PROTEIN: 6.5 g/dL (ref 6.5–8.1)

## 2015-08-23 LAB — GASTROINTESTINAL PANEL BY PCR, STOOL (REPLACES STOOL CULTURE)
Adenovirus F40/41: NOT DETECTED
Astrovirus: NOT DETECTED
CRYPTOSPORIDIUM: NOT DETECTED
Campylobacter species: NOT DETECTED
Cyclospora cayetanensis: NOT DETECTED
E. COLI O157: NOT DETECTED
ENTAMOEBA HISTOLYTICA: NOT DETECTED
Enteroaggregative E coli (EAEC): NOT DETECTED
Enteropathogenic E coli (EPEC): NOT DETECTED
Enterotoxigenic E coli (ETEC): NOT DETECTED
Giardia lamblia: NOT DETECTED
NOROVIRUS GI/GII: NOT DETECTED
PLESIMONAS SHIGELLOIDES: NOT DETECTED
Rotavirus A: NOT DETECTED
SALMONELLA SPECIES: NOT DETECTED
SAPOVIRUS (I, II, IV, AND V): NOT DETECTED
SHIGELLA/ENTEROINVASIVE E COLI (EIEC): NOT DETECTED
Shiga like toxin producing E coli (STEC): NOT DETECTED
VIBRIO CHOLERAE: NOT DETECTED
Vibrio species: NOT DETECTED
YERSINIA ENTEROCOLITICA: NOT DETECTED

## 2015-08-23 LAB — URINE MICROSCOPIC-ADD ON

## 2015-08-23 LAB — MRSA PCR SCREENING: MRSA BY PCR: NEGATIVE

## 2015-08-23 LAB — APTT: aPTT: 52 seconds — ABNORMAL HIGH (ref 24–37)

## 2015-08-23 MED ORDER — SODIUM CHLORIDE 0.9% FLUSH
10.0000 mL | INTRAVENOUS | Status: DC | PRN
  Administered 2015-08-25 (×2): 10 mL
  Filled 2015-08-23 (×2): qty 40

## 2015-08-23 MED ORDER — HEPARIN (PORCINE) IN NACL 100-0.45 UNIT/ML-% IJ SOLN
1350.0000 [IU]/h | INTRAMUSCULAR | Status: DC
  Filled 2015-08-23: qty 250

## 2015-08-23 MED ORDER — FOLIC ACID 1 MG PO TABS
1.0000 mg | ORAL_TABLET | Freq: Every day | ORAL | Status: DC
  Administered 2015-08-23 – 2015-08-25 (×3): 1 mg via ORAL
  Filled 2015-08-23 (×3): qty 1

## 2015-08-23 MED ORDER — DIPHENOXYLATE-ATROPINE 2.5-0.025 MG PO TABS
2.0000 | ORAL_TABLET | Freq: Three times a day (TID) | ORAL | Status: DC
  Administered 2015-08-23 – 2015-08-25 (×7): 2 via ORAL
  Filled 2015-08-23 (×7): qty 2

## 2015-08-23 MED ORDER — ALBUTEROL SULFATE (2.5 MG/3ML) 0.083% IN NEBU: 2.5000 mg | INHALATION_SOLUTION | RESPIRATORY_TRACT | Status: DC | PRN

## 2015-08-23 MED ORDER — ATORVASTATIN CALCIUM 10 MG PO TABS
10.0000 mg | ORAL_TABLET | Freq: Every morning | ORAL | Status: DC
  Administered 2015-08-24 – 2015-08-25 (×2): 10 mg via ORAL
  Filled 2015-08-23 (×2): qty 1

## 2015-08-23 MED ORDER — SODIUM CHLORIDE 0.9% FLUSH
10.0000 mL | Freq: Two times a day (BID) | INTRAVENOUS | Status: DC
  Administered 2015-08-23: 10 mL

## 2015-08-23 MED ORDER — HEPARIN (PORCINE) IN NACL 100-0.45 UNIT/ML-% IJ SOLN: 1350.0000 [IU]/h | INTRAMUSCULAR | Status: AC

## 2015-08-23 MED ORDER — VITAMIN B-12 100 MCG PO TABS
100.0000 ug | ORAL_TABLET | Freq: Every day | ORAL | Status: DC
  Administered 2015-08-23 – 2015-08-25 (×3): 100 ug via ORAL
  Filled 2015-08-23 (×3): qty 1

## 2015-08-23 MED ORDER — HYDROMORPHONE HCL 1 MG/ML IJ SOLN
1.0000 mg | INTRAMUSCULAR | Status: DC | PRN
  Administered 2015-08-23 (×2): 1 mg via INTRAVENOUS
  Filled 2015-08-23 (×2): qty 1

## 2015-08-23 MED ORDER — ENSURE ENLIVE PO LIQD
237.0000 mL | Freq: Two times a day (BID) | ORAL | Status: DC
  Administered 2015-08-24: 237 mL via ORAL

## 2015-08-23 MED ORDER — LOPERAMIDE HCL 2 MG PO CAPS
4.0000 mg | ORAL_CAPSULE | Freq: Three times a day (TID) | ORAL | Status: DC
  Administered 2015-08-23 – 2015-08-25 (×7): 4 mg via ORAL
  Filled 2015-08-23 (×11): qty 2

## 2015-08-23 MED ORDER — PIPERACILLIN-TAZOBACTAM IN DEX 2-0.25 GM/50ML IV SOLN
2.2500 g | Freq: Four times a day (QID) | INTRAVENOUS | Status: DC
  Administered 2015-08-23 – 2015-08-24 (×5): 2.25 g via INTRAVENOUS
  Filled 2015-08-23 (×6): qty 50

## 2015-08-23 MED ORDER — FERROUS SULFATE 325 (65 FE) MG PO TABS
325.0000 mg | ORAL_TABLET | Freq: Two times a day (BID) | ORAL | Status: DC
  Administered 2015-08-23 – 2015-08-25 (×4): 325 mg via ORAL
  Filled 2015-08-23 (×7): qty 1

## 2015-08-23 MED ORDER — APIXABAN 5 MG PO TABS
5.0000 mg | ORAL_TABLET | Freq: Two times a day (BID) | ORAL | Status: DC
  Administered 2015-08-23 – 2015-08-25 (×4): 5 mg via ORAL
  Filled 2015-08-23 (×6): qty 1

## 2015-08-23 MED ORDER — VANCOMYCIN HCL IN DEXTROSE 1-5 GM/200ML-% IV SOLN: 1000.0000 mg | INTRAVENOUS | Status: DC

## 2015-08-23 MED ORDER — SERTRALINE HCL 100 MG PO TABS
100.0000 mg | ORAL_TABLET | Freq: Every morning | ORAL | Status: DC
  Administered 2015-08-23 – 2015-08-25 (×3): 100 mg via ORAL
  Filled 2015-08-23 (×3): qty 1

## 2015-08-23 MED ORDER — SODIUM CHLORIDE 0.9 % IV SOLN
INTRAVENOUS | Status: DC
  Administered 2015-08-23 – 2015-08-25 (×4): via INTRAVENOUS

## 2015-08-23 MED ORDER — CALCIUM POLYCARBOPHIL 625 MG PO TABS
625.0000 mg | ORAL_TABLET | Freq: Two times a day (BID) | ORAL | Status: DC
  Administered 2015-08-23 – 2015-08-25 (×4): 625 mg via ORAL
  Filled 2015-08-23 (×5): qty 1

## 2015-08-23 MED ORDER — CHOLESTYRAMINE 4 G PO PACK
4.0000 g | PACK | Freq: Every day | ORAL | Status: DC
  Administered 2015-08-23 – 2015-08-24 (×2): 4 g via ORAL
  Filled 2015-08-23 (×2): qty 1

## 2015-08-23 MED ORDER — ONDANSETRON HCL 4 MG PO TABS: 4.0000 mg | ORAL_TABLET | Freq: Four times a day (QID) | ORAL | Status: DC | PRN

## 2015-08-23 MED ORDER — ONDANSETRON HCL 4 MG/2ML IJ SOLN
4.0000 mg | Freq: Four times a day (QID) | INTRAMUSCULAR | Status: DC | PRN
  Filled 2015-08-23: qty 2

## 2015-08-23 MED ORDER — LORAZEPAM 0.5 MG PO TABS
0.5000 mg | ORAL_TABLET | Freq: Every morning | ORAL | Status: DC
  Administered 2015-08-24 – 2015-08-25 (×2): 0.5 mg via ORAL
  Filled 2015-08-23 (×2): qty 1

## 2015-08-23 MED ORDER — OXYCODONE HCL 5 MG PO TABS
5.0000 mg | ORAL_TABLET | ORAL | Status: DC | PRN
Start: 2015-08-23 — End: 2015-08-25
  Administered 2015-08-23 – 2015-08-24 (×2): 5 mg via ORAL
  Filled 2015-08-23 (×2): qty 1

## 2015-08-23 MED ORDER — HEPARIN (PORCINE) IN NACL 100-0.45 UNIT/ML-% IJ SOLN: 1100.0000 [IU]/h | INTRAMUSCULAR | Status: DC

## 2015-08-23 MED ORDER — HEPARIN (PORCINE) IN NACL 100-0.45 UNIT/ML-% IJ SOLN
1250.0000 [IU]/h | INTRAMUSCULAR | Status: DC
  Administered 2015-08-23: 1250 [IU]/h via INTRAVENOUS
  Filled 2015-08-23: qty 250

## 2015-08-23 NOTE — Progress Notes (Signed)
Initial Nutrition Assessment  DOCUMENTATION CODES:   Not applicable  INTERVENTION:  -RD to continue to monitor -Pt declined ONS  NUTRITION DIAGNOSIS:   Inadequate oral intake related to poor appetite as evidenced by per patient/family report.  GOAL:   Patient will meet greater than or equal to 90% of their needs  MONITOR:   PO intake, Labs, I & O's, Skin  REASON FOR ASSESSMENT:   Malnutrition Screening Tool    ASSESSMENT:   Cathy Jones is a 55 y.o. female with a past medical history of hypertension, hyperlipidemia, asthma, depression, anxiety known to me from a recent admission when she had abdominal pain, with a CT scan showing splenic infarctions, aortic thrombosis and AKI about a month following a planned LAR and ileostomy who comes to the emergency department with dizziness and feeling dehydrated.   Spoke with Cathy Jones, family at bedside.   She endorses vomiting during her previous admission discharged -> 3/25 She states that she was eating better after that: "I had some good days." But ultimately, she presented again with similar symptoms.  She does exhibit a 17#/8.7% severe wt loss in 1 week but likely related to dehydration from vomiting.  Currently on FLD, patient had 100% tray completion during my visit.  Labs and Medications reviewed.  Diet Order:  Diet full liquid Room service appropriate?: Yes; Fluid consistency:: Thin  Skin:  Reviewed, no issues  Last BM:  4/13  Height:   Ht Readings from Last 1 Encounters:  08/23/15 5\' 2"  (1.575 m)    Weight:   Wt Readings from Last 1 Encounters:  08/23/15 178 lb 12.7 oz (81.1 kg)    Ideal Body Weight:  50 kg  BMI:  Body mass index is 32.69 kg/(m^2).  Estimated Nutritional Needs:   Kcal:  1450-1750  Protein:  80-100g  Fluid:  >/= 1.5L  EDUCATION NEEDS:   No education needs identified at this time  Satira Anis. Jerita Wimbush, MS, RD LDN After Hours/Weekend Pager [phone]

## 2015-08-23 NOTE — Progress Notes (Signed)
Peripherally Inserted Central Catheter/Midline Placement  The IV Nurse has discussed with the patient and/or persons authorized to consent for the patient, the purpose of this procedure and the potential benefits and risks involved with this procedure.  The benefits include less needle sticks, lab draws from the catheter and patient may be discharged home with the catheter.  Risks include, but not limited to, infection, bleeding, blood clot (thrombus formation), and puncture of an artery; nerve damage and irregular heat beat.  Alternatives to this procedure were also discussed.  PICC/Midline Placement Documentation        Cathy Jones 08/23/2015, 4:03 PM Consent obtained by Claretha Cooper, RN

## 2015-08-23 NOTE — Progress Notes (Signed)
Pharmacy Antibiotic Note  Cathy Jones is a 55 y.o. female admitted on 08/22/2015 with sepsis.  Pharmacy has been consulted for Vancomycin and Zosyn  dosing.  Plan: Vancomycin 1gm IV every 48 hours.  Goal trough 15-20 mcg/mL.  Zosyn 2.25gm iv q6hr  Height: 5\' 2"  (157.5 cm) Weight: 178 lb 12.7 oz (81.1 kg) IBW/kg (Calculated) : 50.1  Temp (24hrs), Avg:98 F (36.7 C), Min:97.8 F (36.6 C), Max:98.1 F (36.7 C)   Recent Labs Lab 08/22/15 2053 08/22/15 2232  WBC 32.1*  --   CREATININE 3.95*  --   LATICACIDVEN  --  0.94    Estimated Creatinine Clearance: 16.3 mL/min (by C-G formula based on Cr of 3.95).    Allergies  Allergen Reactions  . Codeine Other (See Comments)    "hyper"    Antimicrobials this admission: Vancomycin 4/12 >> Zosyn 4/12 >>   Microbiology results: Pending  Thank you for allowing pharmacy to be a part of this patient's care.  Nani Skillern Crowford 08/23/2015 12:49 AM

## 2015-08-23 NOTE — Progress Notes (Signed)
ANTICOAGULATION CONSULT NOTE - Follow Up Consult  Pharmacy Consult for Heparin Indication: Hx of Aortic thrombosis, RLE ischemia; PTA apixaban held  Allergies  Allergen Reactions  . Codeine Other (See Comments)    "hyper"    Patient Measurements: Height: 5\' 2"  (157.5 cm) Weight: 178 lb 12.7 oz (81.1 kg) IBW/kg (Calculated) : 50.1 Heparin Dosing Weight: 68 kg  Vital Signs: Temp: 97.9 F (36.6 C) (04/13 0355) Temp Source: Oral (04/13 0355) BP: 103/64 mmHg (04/13 0500) Pulse Rate: 71 (04/13 0500)  Labs:  Recent Labs  08/22/15 2053 08/23/15 0321  HGB 13.9 10.7*  HCT 40.8 31.9*  PLT 443* 314  CREATININE 3.95* 2.95*    Estimated Creatinine Clearance: 21.8 mL/min (by C-G formula based on Cr of 2.95).   Medications: Infusions:  . sodium chloride 100 mL/hr at 08/23/15 0219  . heparin 1,250 Units/hr (08/23/15 0102)    Assessment: 47 yoF directly admitted 4/12 with near syncope, nausea, leukocytosis, and electrolyte abnormality.  PMH significant for recent aortic thrombosis and RLE ischemia s/p angioplasty with stenting of left common iliac artery, thrombectomy of Rt iliac artery, fem-fem bypass.  She is on Apixaban PTA with last dose taken 4/11 PM.  Pharmacy consulted to dose heparin drip while apixaban is held.  Today, 08/23/2015:  APTT 52, below goal on heparin 1250 units/hr.  Heparin level: 0.77, above goal but expected with recent apixaban use and AKI  CBC: Hgb 10.7, Plt remain WNL  No bleeding, complications, or IV problems noted.  SCr remains elevated but improved at 2.95 with CrCl ~ 22 ml/min   Goal of Therapy:  Heparin level 0.3-0.7 units/ml aPTT 66-102 seconds Monitor platelets by anticoagulation protocol: Yes   Plan:   Increase to heparin IV infusion at 1350 units/hr  Heparin level 8 hours after rate change  Daily heparin level and CBC  Continue to monitor H&H and platelets   Gretta Arab PharmD, BCPS Pager [phone] 08/23/2015 7:49 AM

## 2015-08-23 NOTE — Care Management Note (Signed)
Case Management Note  Patient Details  Name: Cathy Jones MRN: [id number] Date of Birth: [birth year]  Subjective/Objective:         Multiple health problems, aki and sepsis           Action/Plan:Date:  August 23, 2015 Chart reviewed for concurrent status and case management needs. Will continue to follow patient for changes and needs: Velva Harman, BSN, RN, [place]   [phone]   Expected Discharge Date:                  Expected Discharge Plan:  Home/Self Care  In-House Referral:  NA  Discharge planning Services  CM Consult  Post Acute Care Choice:  NA Choice offered to:  NA  DME Arranged:    DME Agency:     HH Arranged:    [place] Agency:     Status of Service:  Completed, signed off  Medicare Important Message Given:    Date Medicare IM Given:    Medicare IM give by:    Date Additional Medicare IM Given:    Additional Medicare Important Message give by:     If discussed at [place] of Stay Meetings, dates discussed:    Additional Comments:  Leeroy Cha, RN 08/23/2015, 10:45 AM

## 2015-08-23 NOTE — H&P (Signed)
[facility] History and Physical  Cathy Jones [id number] DOB: 04/18/1961 DOA: 08/22/2015  Referring physician: Josephina Gip, PA-C PCP: Tula Nakayama   Chief Complaint: Dizziness and dehydration.  HPI: Cathy Jones is a 55 y.o. female with a past medical history of hypertension, hyperlipidemia, asthma, depression, anxiety known to me from a recent admission when she had abdominal pain, with a CT scan showing splenic infarctions, aortic thrombosis and AKI about a month following a planned LAR and ileostomy who comes to the emergency department with dizziness and feeling dehydrated.   Per patient, she was following with Dr. Marcello Moores today and was going to be admitted directly to the hospital due to nausea, emesis and dehydration and leukocytosis. She states, that she became lightheaded while walking and felt like she was about to pass out, but did not lose consciousness. She denies chest pain, dyspnea, diaphoresis, but states she felt palpitations, nausea associated with her dizziness.   When seen, the patient looks chronically ill, pale and dehydrated, but was in no acute distress. Her workup reveals acidemia and leukocytosis.   Review of Systems:  Constitutional:  Positive fatigue.  No weight loss, night sweats, Fevers, chills HEENT:  No headaches, Difficulty swallowing,Tooth/dental problems,Sore throat,  No sneezing, itching, ear ache, nasal congestion, post nasal drip,  Cardio-vascular:  Positive dizziness, palpitations  No chest pain, Orthopnea, PND, swelling in lower extremities, anasarca GI:  Positive nausea, vomiting, increased ileostomy output, loss of appetite . No heartburn, indigestion, diarrhea, change in bowel habits Resp:  No shortness of breath with exertion or at rest. No excess mucus, no productive cough, No non-productive cough, No coughing up of blood.No change in color of mucus.No wheezing.No chest wall deformity  Skin:  no rash or lesions.   GU:  no dysuria, change in color of urine, no urgency or frequency. No flank pain.  Musculoskeletal:  No joint pain or swelling. No decreased range of motion. No back pain.  Psych:  No change in mood or affect. No depression or anxiety. No memory loss.   Past Medical History  Diagnosis Date  . Hypertension   . Hyperlipidemia   . Asthma     pt brought her proair inhaler 06-06-14  . Colon polyp     hyperplastic  . 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 Colonic obstruction ([place])   . Depression   . History of hiatal hernia   . Family history of adverse reaction to anesthesia     pts mother experiences nausea and vomiting   . Shortness of breath dyspnea     seasonal   . Anemia   . GERD (gastroesophageal reflux disease)   . Migraine     "q couple years maybe" (08/07/2015)  . Anxiety   . Acute renal failure (ARF) ([place]) 08/07/2015  . Cervical cancer ([place])   . 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 Colonoscopy    . Upper gastrointestinal endoscopy    . Tubal ligation    . Colon resection N/A 06/27/2015    Procedure: LYSIS OF ADHESIONS LOW ANTERIOR RESECTION DIVERTING LOOP ILEOSTOMY;  Surgeon: Leighton Ruff, MD;  Location: [facility];  Service: General;  Laterality: N/A;  . Diverting ileostomy N/A 06/27/2015    Procedure: DIVERTING LOOP ILEOSTOMY;  Surgeon: Leighton Ruff, MD;  Location: [facility];  Service: General;  Laterality: N/A;  . Cystoscopy with stent placement Bilateral 06/27/2015    Procedure: CYSTOSCOPY WITH CATHETER  PLACEMENT;  Surgeon: Franchot Gallo, MD;  Location: [facility];  Service: Urology;  Laterality: Bilateral;  . Colon surgery    . Abdominal hysterectomy  1994  . Peripheral vascular catheterization N/A 08/09/2015    Procedure: Abdominal Aortogram w/Lower Extremity;  Surgeon: Conrad [place], MD;  Location: [facility];  Service: Cardiovascular;  Laterality: N/A;  . Peripheral vascular catheterization Left 08/09/2015     Procedure: Peripheral Vascular Intervention;  Surgeon: Conrad [place], MD;  Location: [facility];  Service: Cardiovascular;  Laterality: Left;  common iliac  . Femoral-femoral bypass graft Bilateral 08/10/2015    Procedure: BYPASS GRAFT LEFT FEMORALTO RIGHT FEMORAL ARTERY;  Surgeon: Rosetta Posner, MD;  Location: [facility];  Service: Vascular;  Laterality: Bilateral;  . Thrombectomy iliac artery Right 08/10/2015    Procedure: THROMBECTOMY Right ILIAC ARTERY;  Surgeon: Rosetta Posner, MD;  Location: [facility];  Service: Vascular;  Laterality: Right;  . Intraoperative arteriogram Right 08/10/2015    Procedure: INTRA OPERATIVE ARTERIOGRAM Right Iliac Artery;  Surgeon: Rosetta Posner, MD;  Location: [facility];  Service: Vascular;  Laterality: Right;   Social History:  reports that she quit smoking about 3 months ago. Her smoking use included Cigarettes. She has a 40 pack-year smoking history. She has never used smokeless tobacco. She reports that she drinks alcohol. She reports that she uses illicit drugs (Marijuana).  Allergies  Allergen Reactions  . Codeine Other (See Comments)    "hyper"    Family History  Problem Relation Age of Onset  . Colon cancer Neg Hx   . Stomach cancer Neg Hx   . Esophageal cancer Neg Hx   . Deep vein thrombosis Neg Hx   . Rectal cancer Cousin   . Hypertension Mother   . Hyperparathyroidism Mother   . Hypertension Father   . CAD Father     CABG  . Peripheral vascular disease Father   . Graves' disease Father     Prior to Admission medications   Medication Sig Start Date End Date Taking? Authorizing Provider  apixaban (ELIQUIS) 5 MG TABS tablet Take 1 tablet (5 mg total) by mouth 2 (two) times daily. 08/08/15  Yes Debbe Odea, MD  atorvastatin (LIPITOR) 10 MG tablet Take 10 mg by mouth every morning.   Yes Historical Provider, MD  diphenoxylate-atropine (LOMOTIL) 2.5-0.025 MG tablet Take 2 tablets by mouth 4 (four) times daily -  before meals and at bedtime. 08/04/15  Yes  Michael Boston, MD  ferrous sulfate 325 (65 FE) MG tablet Take 1 tablet (325 mg total) by mouth 2 (two) times daily with a meal. 08/04/15  Yes Michael Boston, MD  folic acid (FOLVITE) 1 MG tablet Take 1 tablet (1 mg total) by mouth daily. 08/04/15  Yes Debbe Odea, MD  ibuprofen (ADVIL,MOTRIN) 200 MG tablet Take 600 mg by mouth every 6 (six) hours as needed for moderate pain.   Yes Historical Provider, MD  loperamide (IMODIUM) 2 MG capsule Take 2 capsules (4 mg total) by mouth 4 (four) times daily -  before meals and at bedtime. 08/04/15  Yes Michael Boston, MD  LORazepam (ATIVAN) 0.5 MG tablet Take 0.5 mg by mouth every morning.   Yes Historical Provider, MD  oxyCODONE (OXY IR/ROXICODONE) 5 MG immediate release tablet Take 1 tablet (5 mg total) by mouth every 4 (four) hours as needed for moderate pain. 08/11/15  Yes Ulyses Amor, PA-C  polycarbophil (FIBERCON) 625 MG tablet Take 1 tablet (625 mg total) by  mouth 2 (two) times daily. 08/04/15  Yes Michael Boston, MD  PROAIR HFA 108 (90 BASE) MCG/ACT inhaler Inhale 1-2 puffs into the lungs every 4 (four) hours as needed for wheezing or shortness of breath.  05/06/14  Yes Historical Provider, MD  sertraline (ZOLOFT) 100 MG tablet Take one tablet by mouth every morning. 02/19/15  Yes Historical Provider, MD  vitamin B-12 1000 MCG tablet Take 0.5 tablets (500 mcg total) by mouth daily. 08/04/15  Yes Debbe Odea, MD  nystatin (MYCOSTATIN/NYSTOP) 100000 UNIT/GM POWD Apply under pannus TID Patient not taking: Reported on 08/22/2015 08/04/15   Debbe Odea, MD  paregoric 2 MG/5ML solution Take 5-10 mLs by mouth every 6 (six) hours as needed for diarrhea or loose stools (Give if > 356mL q6h). Patient not taking: Reported on 08/22/2015 08/04/15   Debbe Odea, MD   Physical Exam: Filed Vitals:   08/22/15 1952 08/22/15 2158  BP: 121/92 130/82  Pulse: 107 86  Temp: 97.8 F (36.6 C)   TempSrc: Oral   Resp: 20 18  SpO2: 98% 99%    Wt Readings from Last 3 Encounters:   08/12/15 88.6 kg (195 lb 5.2 oz)  08/01/15 87.726 kg (193 lb 6.4 oz)  06/27/15 97.297 kg (214 lb 8 oz)    General:  Appears calm and comfortable Eyes: PERRL, normal lids, irises & conjunctiva ENT: grossly normal hearing, lips & tongue. Oral mucosa is dry. Neck: no LAD, masses or thyromegaly Cardiovascular: RRR, no m/r/g. No LE edema. Telemetry: SR, no arrhythmias  Respiratory: CTA bilaterally, no w/r/r. Normal respiratory effort. Abdomen: Ileostomy in place, surgical scars, bowel sounds positive, soft, nontender. Skin: no rash or induration seen on limited exam Musculoskeletal: grossly normal tone BUE/BLE Psychiatric: grossly normal mood and affect, speech fluent and appropriate Neurologic: Awake, alert, oriented x4, grossly non-focal.          Labs on Admission:  Basic Metabolic Panel:  Recent Labs Lab 08/22/15 2053  NA 123*  K 4.8  CL 90*  CO2 16*  GLUCOSE 177*  BUN 73*  CREATININE 3.95*  CALCIUM 10.2    CBC:  Recent Labs Lab 08/22/15 2053  WBC 32.1*  HGB 13.9  HCT 40.8  MCV 78.2  PLT 443*    CBG:  Recent Labs Lab 08/22/15 2017  GLUCAP 228*    EKG: Independently reviewed.  Vent. rate 105 BPM PR interval 143 ms QRS duration 91 ms QT/QTc 342/452 ms P-R-T axes 74 76 92 Sinus tachycardia Ventricular premature complex Aberrant conduction of SV complex(es) Low voltage, precordial leads Anteroseptal infarct, old  Assessment/Plan Principal Problem:   AKI (acute kidney injury) ([place]) Similar to previous admission. Admit to the stepdown. Continue fluid challenge. Follow-up renal function in a.m. Consider expanding workup in nephrology consultation and if no response.  Active Problems:    Hyponatremia Secondary to GI losses. Continue IV hydration. Follow-up level in a.m.     Dehydration Continue IV hydration.       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    PAD (peripheral artery disease) ([facility]) status post stent placement on LLE. She has a right iliac occlusion and is a candidate for left to right femorofemoral bypass. Continue anticoagulation.      Leukocytosis No specific source. Continue broad-spectrum antibiotics.        Hyperlipidemia Continue atorvastatin.   General surgery will follow in  a.m.   Code Status: Full code. DVT Prophylaxis: On Eliquis. Family Communication: Her husband was by bedside. Disposition Plan: Admit to stepdown for further evaluation and treatment.  Time spent: Over 70 minutes were used in the presence admission.  Reubin Milan, M.D. [facility] Pager [phone].

## 2015-08-23 NOTE — Progress Notes (Signed)
[facility]                                                                              Patient Demographics  Cathy Jones, is a 55 y.o. female, DOB - 02-27-1961, YE:[id number]  Admit date - 08/22/2015   Admitting Physician Reubin Milan, MD  Outpatient Primary MD for the patient is Tula Nakayama  LOS - 1   Chief Complaint  Patient presents with  . Near Syncope      HPI on 08/23/2015 by Dr. Tennis Must Cathy Jones is a 55 y.o. female with a past medical history of hypertension, hyperlipidemia, asthma, depression, anxiety known to me from a recent admission when she had abdominal pain, with a CT scan showing splenic infarctions, aortic thrombosis and AKI about a month following a planned LAR and ileostomy who comes to the emergency department with dizziness and feeling dehydrated.   Per patient, she was following with Dr. Marcello Moores today and was going to be admitted directly to the hospital due to nausea, emesis and dehydration and leukocytosis. She states, that she became lightheaded while walking and felt like she was about to pass out, but did not lose consciousness. She denies chest pain, dyspnea, diaphoresis, but states she felt palpitations, nausea associated with her dizziness.   When seen, the patient looks chronically ill, pale and dehydrated, but was in no acute distress. Her workup reveals acidemia and leukocytosis.  Assessment & Plan   Acute kidney injury with metabolic acidosis -secondary to dehydration and high output -Upon admission, creatinine 3.95.  Baseline less than 1. -Currently creatinine 2.95. -Continue to monitor BMP  Dehydration and hyponatremia  -secondary to high output ostomy -Continue IV fluids and monitor BMP  Diverting ileostomy with high output -Gen. surgery consulted and appreciated -PICC line ordered for IV fluid infusions twice weekly -Continue Lomotil, Imodium, fiber -Will order Questran -Monitor output -GI pathogen  panel negative  Leukocytosis -No specific source. Suspect this is reactive.  -Patient denies any cough, fever, problems with urination -UA and chest x-ray negative for infection -Based on broad-spectrum antibiotics. -Will likely discontinue antibiotics in 24 hours -Continue to monitor CBC  Abdominal aorta thrombosis -Continue anticoagulation  Peripheral arterial disease -Patient recently status post left lower extremity femoropopliteal -Continue anticoagulation  Hyperlipidemia -Continue statin  Depression/anxiety -Continue Zoloft, Ativan  Anemia -Continue monitor CBC. Continue iron supplementation  Code Status: Full  Family Communication: None bedside  Disposition Plan: Admitted.   Time Spent in minutes   30 minutes  Procedures  None   Consults   General sugery, Dr. Marcello Moores  DVT Prophylaxis  Eliquis  Lab Results  Component Value Date   PLT 314 08/23/2015    Medications  Scheduled Meds: . cholestyramine  4 g Oral Daily  . feeding supplement (ENSURE ENLIVE)  237 mL Oral BID BM  . pantoprazole (PROTONIX) IV  40 mg Intravenous Q24H  . piperacillin-tazobactam (ZOSYN)  IV  2.25 g Intravenous 4 times per day  . [START ON 08/24/2015] vancomycin  1,000 mg Intravenous Q48H   Continuous Infusions: . sodium chloride 100 mL/hr at 08/23/15 1105  . heparin 1,350 Units/hr (08/23/15 1100)   PRN Meds:.HYDROmorphone (DILAUDID) injection, ondansetron **OR**  ondansetron (ZOFRAN) IV, ondansetron (ZOFRAN) IV  Antibiotics    Anti-infectives    Start     Dose/Rate Route Frequency Ordered Stop   08/24/15 2200  vancomycin (VANCOCIN) IVPB 1000 mg/200 mL premix     1,000 mg 200 mL/hr over 60 Minutes Intravenous Every 48 hours 08/23/15 0042     08/23/15 0600  piperacillin-tazobactam (ZOSYN) IVPB 2.25 g     2.25 g 100 mL/hr over 30 Minutes Intravenous 4 times per day 08/23/15 0042     08/22/15 2200  piperacillin-tazobactam (ZOSYN) IVPB 3.375 g     3.375 g 100 mL/hr over 30  Minutes Intravenous  Once 08/22/15 2156 08/22/15 2323   08/22/15 2200  vancomycin (VANCOCIN) IVPB 1000 mg/200 mL premix     1,000 mg 200 mL/hr over 60 Minutes Intravenous  Once 08/22/15 2156 08/22/15 2353        Subjective:   Cathy Jones seen and examined today.  Patient states she's feeling much better than she did prior to coming into the hospital. Feels her output may have decreased slightly. Denies any chest pain, shortness breath, abdominal pain, dizziness or headache.  Objective:   Filed Vitals:   08/23/15 0500 08/23/15 0757 08/23/15 0800 08/23/15 1201  BP: 103/64  104/52   Pulse: 71  72   Temp:  97.9 F (36.6 C)  98.1 F (36.7 C)  TempSrc:  Oral  Oral  Resp: 17  14   Height:      Weight:      SpO2: 100%  98%     Wt Readings from Last 3 Encounters:  08/23/15 81.1 kg (178 lb 12.7 oz)  08/12/15 88.6 kg (195 lb 5.2 oz)  08/01/15 87.726 kg (193 lb 6.4 oz)     Intake/Output Summary (Last 24 hours) at 08/23/15 1311 Last data filed at 08/23/15 0700  Gross per 24 hour  Intake 592.91 ml  Output    475 ml  Net 117.91 ml    Exam  General: Well developed, well nourished, NAD, appears stated age  HEENT: NCAT,  mucous membranes moist.   Cardiovascular: S1 S2 auscultated, no rubs, murmurs or gallops. Regular rate and rhythm.  Respiratory: Clear to auscultation bilaterally  Abdomen: Soft, nontender, nondistended, + bowel sounds, +ostomy, bag gas filled  Extremities: warm dry without cyanosis clubbing or edema  Neuro: AAOx3, nonfocal  Psych: Normal affect and demeanor   Data Review   Micro Results Recent Results (from the past 240 hour(s))  MRSA PCR Screening     Status: None   Collection Time: 08/23/15 12:21 AM  Result Value Ref Range Status   MRSA by PCR NEGATIVE NEGATIVE Final    Comment:        The GeneXpert MRSA Assay (FDA approved for NASAL specimens only), is one component of a comprehensive MRSA colonization surveillance program. It is  not intended to diagnose MRSA infection nor to guide or monitor treatment for MRSA infections.   Gastrointestinal Panel by PCR , Stool     Status: None   Collection Time: 08/23/15 12:46 AM  Result Value Ref Range Status   Campylobacter species NOT DETECTED NOT DETECTED Final   Plesimonas shigelloides NOT DETECTED NOT DETECTED Final   Salmonella species NOT DETECTED NOT DETECTED Final   Yersinia enterocolitica NOT DETECTED NOT DETECTED Final   Vibrio species NOT DETECTED NOT DETECTED Final   Vibrio cholerae NOT DETECTED NOT DETECTED Final   Enteroaggregative E coli (EAEC) NOT DETECTED NOT DETECTED Final   Enteropathogenic E  coli (EPEC) NOT DETECTED NOT DETECTED Final   Enterotoxigenic E coli (ETEC) NOT DETECTED NOT DETECTED Final   Shiga like toxin producing E coli (STEC) NOT DETECTED NOT DETECTED Final   E. coli O157 NOT DETECTED NOT DETECTED Final   Shigella/Enteroinvasive E coli (EIEC) NOT DETECTED NOT DETECTED Final   Cryptosporidium NOT DETECTED NOT DETECTED Final   Cyclospora cayetanensis NOT DETECTED NOT DETECTED Final   Entamoeba histolytica NOT DETECTED NOT DETECTED Final   Giardia lamblia NOT DETECTED NOT DETECTED Final   Adenovirus F40/41 NOT DETECTED NOT DETECTED Final   Astrovirus NOT DETECTED NOT DETECTED Final   Norovirus GI/GII NOT DETECTED NOT DETECTED Final   Rotavirus A NOT DETECTED NOT DETECTED Final   Sapovirus (I, II, IV, and V) NOT DETECTED NOT DETECTED Final    Radiology Reports Ct Abdomen Pelvis W Contrast  07/30/2015  CLINICAL DATA:  55 year old female with nausea and vomiting for 1 week. Unintentional 50 lbs. weight loss. Mid abdominal pain. History of History of cervical cancer status post hysterectomy. More recent history of short-segment sigmoid colon stricture treated surgically. EXAM: CT ABDOMEN AND PELVIS WITH CONTRAST TECHNIQUE: Multidetector CT imaging of the abdomen and pelvis was performed using the standard protocol following bolus administration  of intravenous contrast. CONTRAST:  159mL ISOVUE-300 IOPAMIDOL (ISOVUE-300) INJECTION 61% COMPARISON:  CT virtual colonoscopy 07/19/2014. CT Abdomen and Pelvis 06/13/2014. FINDINGS: Lung bases are stable since 2016 and negative. No pericardial or pleural effusion. Stable mild elevation of the left hemidiaphragm. Stable visualized osseous structures. L3 limbus vertebra. Chronic multilevel vertebral endplate irregularity most pronounced at L4. No acute or suspicious osseous lesion identified. Since March 2016 the patient has undergone ventral abdominal surgery with right abdominal ileostomy. There is new confluent presacral stranding. Chronic retroperitoneal and pelvic sidewall dissection with numerous surgical clips otherwise appears stable. Evidence of a sigmoid colectomy with staple line at what appears to be a sigmoid to rectum anastomosis (series 3, image 69). Along the right pelvic sidewall lateral to the apparent anastomosis there is an 18 x 42 mm fluid collection or less likely residual fluid-filled bowel loop (series 3, image 68). The uterus and adnexa are chronically surgically absent. There is a small volume of gas within the vagina. The urinary bladder is completely decompressed today. The left colon is decompressed. The transverse colon is decompressed. The right colon is decompressed. The distal small bowel is decompressed. The ileostomy has no adverse features there. No dilated small bowel loops. Is mild fluid distension of the stomach no pneumoperitoneum or abdominal free fluid. Liver, gallbladder, pancreas and adrenal glands are within normal limits. Portal venous system is patent. The spleen is remarkable for a mildly lobulated but wedge-shaped and peripheral hypodense area along the posterior margin which may be new since 2016. No surrounding inflammation. More subtle peripheral wedge-shaped hypodense area also on image 19 along the anterior contour, and image 12 at the superior pole of the spleen.  No left upper quadrant fluid. Extensive Aortoiliac calcified atherosclerosis noted. Major arterial structures appear to remain patent. However, there is newly seen low-density bulky mural plaque or thrombus along the posterior wall of the descending thoracic aorta measuring 6 x 11 x 10 mm (AP by transverse by CC), new since 2016. Stable residual small retroperitoneal lymph nodes. Bilateral renal enhancement is symmetric and within normal limits. However, despite up to 7 minute delayed imaging through the kidneys no renal contrast excretion occurred. However, at the same time there is no hydronephrosis or hydroureter. Both proximal ureters  are diminutive. Both track into the postoperative retroperitoneal changes an are poorly delineated distally. There is no perinephric stranding. The interval postoperative changes to the ventral abdominal wall are noted with no adverse features. IMPRESSION: 1. Evidence of multiple small splenic infarcts. Associated new soft mural plaque or thrombus in the descending thoracic aorta (series 3, image 3) suspicious for embolic source. Recommend vascular surgery consultation. 2. Interval postoperative changes to the pelvis, including apparent partial sigmoidectomy and reanastomosis. There is an elongated extraluminal fluid collection beginning posterior to the anastomosis site (in the midline presacral space) and tracking along the posterior right pelvic sidewall. This is of unclear etiology and significance. 3. Suspect acute renal insufficiency on the basis of intrinsic renal disease suspected in light of no contrast excretion to from the kidneys up to 7 minutes following injection. No evidence of obstructive uropathy. 4. New right abdominal ileostomy with no bowel obstruction. The above was discussed by telephone with Dr. Leighton Ruff on [id number] at 15:14 . Electronically Signed   By: Genevie Ann M.D.   On: 07/30/2015 15:15   US Renal  07/31/2015  CLINICAL DATA:  Acute renal failure.   Hypertension. EXAM: RENAL / URINARY TRACT ULTRASOUND COMPLETE COMPARISON:  None. FINDINGS: Right Kidney: Length: 10.1 cm. Echogenicity within normal limits. No mass or hydronephrosis visualized. Left Kidney: Length: 11.1 cm. Echogenicity within normal limits. No mass or hydronephrosis visualized. Bladder: Appears normal for degree of bladder distention. IMPRESSION: Normal renal ultrasound.  No hydronephrosis. Electronically Signed   By: Suzy Bouchard M.D.   On: 07/31/2015 08:43   Dg Chest Port 1 View  08/23/2015  CLINICAL DATA:  Acute onset of sepsis. Assess for source of infection. Initial encounter. EXAM: PORTABLE CHEST 1 VIEW COMPARISON:  Chest radiograph performed 10/13/2013 FINDINGS: The lungs are well-aerated and clear. There is no evidence of focal opacification, pleural effusion or pneumothorax. The cardiomediastinal silhouette is within normal limits. No acute osseous abnormalities are seen. IMPRESSION: No acute cardiopulmonary process seen. Electronically Signed   By: Garald Balding M.D.   On: 08/23/2015 01:31   Dg Abd 2 Views  08/10/2015  CLINICAL DATA:  55 year old female undergoing intraoperative arteriogram EXAM: DG C-ARM 61-120 MIN; ABDOMEN - 2 VIEW COMPARISON:  CT abdomen/ pelvis 07/30/2015 FINDINGS: A cine loop demonstrates a vascular sheath in the common femoral artery. Contrast injection demonstrates a patent common femoral artery. The external iliac artery is occluded in the mid pelvis. Filling of internal iliac artery branches is noted via deep circumflex iliac collaterals. The inferior epigastric artery is widely patent. Surgical clips along the right pelvic sidewall suggest prior nodal dissection. The second image demonstrates a wire along the expected path of the external iliac artery. IMPRESSION: Intraoperative arteriogram as above. 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 Electronically Signed   By: Jacqulynn Cadet M.D.   On: 08/10/2015 15:34   Dg  C-arm 1-60 Min  08/10/2015  CLINICAL DATA:  55 year old female undergoing intraoperative arteriogram EXAM: DG C-ARM 61-120 MIN; ABDOMEN - 2 VIEW COMPARISON:  CT abdomen/ pelvis 07/30/2015 FINDINGS: A cine loop demonstrates a vascular sheath in the common femoral artery. Contrast injection demonstrates a patent common femoral artery. The external iliac artery is occluded in the mid pelvis. Filling of internal iliac artery branches is noted via deep circumflex iliac collaterals. The inferior epigastric artery is widely patent. Surgical clips along the right pelvic sidewall suggest prior nodal dissection. The second image demonstrates a wire along the expected path of  the external iliac artery. IMPRESSION: Intraoperative arteriogram as above. 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 Electronically Signed   By: Jacqulynn Cadet M.D.   On: 08/10/2015 15:34    CBC  Recent Labs Lab 08/22/15 2053 08/23/15 0321  WBC 32.1* 23.2*  HGB 13.9 10.7*  HCT 40.8 31.9*  PLT 443* 314  MCV 78.2 78.6  MCH 26.6 26.4  MCHC 34.1 33.5  RDW 14.7 14.7  LYMPHSABS  --  3.5  MONOABS  --  1.7*  EOSABS  --  0.2  BASOSABS  --  0.0    Chemistries   Recent Labs Lab 08/22/15 2053 08/23/15 0321  NA 123* 124*  K 4.8 4.2  CL 90* 91*  CO2 16* 17*  GLUCOSE 177* 124*  BUN 73* 65*  CREATININE 3.95* 2.95*  CALCIUM 10.2 8.7*  AST  --  13*  ALT  --  12*  ALKPHOS  --  189*  BILITOT  --  0.6   ------------------------------------------------------------------------------------------------------------------ estimated creatinine clearance is 21.8 mL/min (by C-G formula based on Cr of 2.95). ------------------------------------------------------------------------------------------------------------------ No results for input(s): HGBA1C in the last 72 hours. ------------------------------------------------------------------------------------------------------------------ No results for  input(s): CHOL, HDL, LDLCALC, TRIG, CHOLHDL, LDLDIRECT in the last 72 hours. ------------------------------------------------------------------------------------------------------------------ No results for input(s): TSH, T4TOTAL, T3FREE, THYROIDAB in the last 72 hours.  Invalid input(s): FREET3 ------------------------------------------------------------------------------------------------------------------ No results for input(s): VITAMINB12, FOLATE, FERRITIN, TIBC, IRON, RETICCTPCT in the last 72 hours.  Coagulation profile No results for input(s): INR, PROTIME in the last 168 hours.  No results for input(s): DDIMER in the last 72 hours.  Cardiac Enzymes No results for input(s): CKMB, TROPONINI, MYOGLOBIN in the last 168 hours.  Invalid input(s): CK ------------------------------------------------------------------------------------------------------------------ Invalid input(s): POCBNP    Tonya Wantz D.O. on 08/23/2015 at 1:11 PM  Between 7am to 7pm - Pager - [phone]  After 7pm go to [email or web address] - password [email or web address]  And look for the night coverage person covering for me after hours  [facility] Office  [phone]

## 2015-08-23 NOTE — Progress Notes (Signed)
AKI (acute kidney injury) ([place])  Subjective: Pt here with dehydration.  Getting IV fluid replacements.  Responding appropriately.  Mainly complains of GERD and upper GI discomfort.  Denies any fevers at home or pelvic pain.  Denies any rectal drainage.  Objective: Vital signs in last 24 hours: Temp:  [97.8 F (36.6 C)-98.1 F (36.7 C)] 97.9 F (36.6 C) (04/13 0757) Pulse Rate:  [71-107] 72 (04/13 0800) Resp:  [14-20] 14 (04/13 0800) BP: (103-130)/(52-92) 104/52 mmHg (04/13 0800) SpO2:  [98 %-100 %] 98 % (04/13 0800) Weight:  [81.1 kg (178 lb 12.7 oz)] 81.1 kg (178 lb 12.7 oz) (04/13 0040) Last BM Date: 08/23/15  Intake/Output from previous day: 04/12 0701 - 04/13 0700 In: 592.9 [I.V.:542.9; IV Piggyback:50] Out: [id number] [Urine:350; Stool:125] Intake/Output this shift:    General appearance: alert and cooperative GI: normal findings: soft, non-tender Skin: Skin color, texture, turgor normal. No rashes or lesions Incision/Wound: groin wounds without infection Ostomy: pink and viable.  Liquid stool in bag  Lab Results:  Results for orders placed or performed during the hospital encounter of 08/22/15 (from the past 24 hour(s))  CBG monitoring, ED     Status: Abnormal   Collection Time: 08/22/15  8:17 PM  Result Value Ref Range   Glucose-Capillary 228 (H) 65 - 99 mg/dL  Basic metabolic panel     Status: Abnormal   Collection Time: 08/22/15  8:53 PM  Result Value Ref Range   Sodium 123 (L) 135 - 145 mmol/L   Potassium 4.8 3.5 - 5.1 mmol/L   Chloride 90 (L) 101 - 111 mmol/L   CO2 16 (L) 22 - 32 mmol/L   Glucose, Bld 177 (H) 65 - 99 mg/dL   BUN 73 (H) 6 - 20 mg/dL   Creatinine, Ser 3.95 (H) 0.44 - 1.00 mg/dL   Calcium 10.2 8.9 - 10.3 mg/dL   GFR calc non Af Amer 12 (L) >60 mL/min   GFR calc Af Amer 14 (L) >60 mL/min   Anion gap 17 (H) 5 - 15  CBC     Status: Abnormal   Collection Time: 08/22/15  8:53 PM  Result Value Ref Range   WBC 32.1 (H) 4.0 - 10.5 K/uL   RBC 5.22 (H)  3.87 - 5.11 MIL/uL   Hemoglobin 13.9 12.0 - 15.0 g/dL   HCT 40.8 36.0 - 46.0 %   MCV 78.2 78.0 - 100.0 fL   MCH 26.6 26.0 - 34.0 pg   MCHC 34.1 30.0 - 36.0 g/dL   RDW 14.7 11.5 - 15.5 %   Platelets 443 (H) 150 - 400 K/uL  I-Stat CG4 Lactic Acid, ED  (not at  [facility])     Status: None   Collection Time: 08/22/15 10:32 PM  Result Value Ref Range   Lactic Acid, Venous 0.94 0.5 - 2.0 mmol/L  MRSA PCR Screening     Status: None   Collection Time: 08/23/15 12:21 AM  Result Value Ref Range   MRSA by PCR NEGATIVE NEGATIVE  CBC WITH DIFFERENTIAL     Status: Abnormal   Collection Time: 08/23/15  3:21 AM  Result Value Ref Range   WBC 23.2 (H) 4.0 - 10.5 K/uL   RBC 4.06 3.87 - 5.11 MIL/uL   Hemoglobin 10.7 (L) 12.0 - 15.0 g/dL   HCT 31.9 (L) 36.0 - 46.0 %   MCV 78.6 78.0 - 100.0 fL   MCH 26.4 26.0 - 34.0 pg   MCHC 33.5 30.0 - 36.0 g/dL   RDW  14.7 11.5 - 15.5 %   Platelets 314 150 - 400 K/uL   Neutrophils Relative % 77 %   Neutro Abs 17.8 (H) 1.7 - 7.7 K/uL   Lymphocytes Relative 15 %   Lymphs Abs 3.5 0.7 - 4.0 K/uL   Monocytes Relative 7 %   Monocytes Absolute 1.7 (H) 0.1 - 1.0 K/uL   Eosinophils Relative 1 %   Eosinophils Absolute 0.2 0.0 - 0.7 K/uL   Basophils Relative 0 %   Basophils Absolute 0.0 0.0 - 0.1 K/uL  Comprehensive metabolic panel     Status: Abnormal   Collection Time: 08/23/15  3:21 AM  Result Value Ref Range   Sodium 124 (L) 135 - 145 mmol/L   Potassium 4.2 3.5 - 5.1 mmol/L   Chloride 91 (L) 101 - 111 mmol/L   CO2 17 (L) 22 - 32 mmol/L   Glucose, Bld 124 (H) 65 - 99 mg/dL   BUN 65 (H) 6 - 20 mg/dL   Creatinine, Ser 2.95 (H) 0.44 - 1.00 mg/dL   Calcium 8.7 (L) 8.9 - 10.3 mg/dL   Total Protein 6.5 6.5 - 8.1 g/dL   Albumin 3.3 (L) 3.5 - 5.0 g/dL   AST 13 (L) 15 - 41 U/L   ALT 12 (L) 14 - 54 U/L   Alkaline Phosphatase 189 (H) 38 - 126 U/L   Total Bilirubin 0.6 0.3 - 1.2 mg/dL   GFR calc non Af Amer 17 (L) >60 mL/min   GFR calc Af Amer 20 (L) >60 mL/min   Anion  gap 16 (H) 5 - 15  Urinalysis, Routine w reflex microscopic (not at [facility])     Status: Abnormal   Collection Time: 08/23/15  6:53 AM  Result Value Ref Range   Color, Urine YELLOW YELLOW   APPearance CLOUDY (A) CLEAR   Specific Gravity, Urine 1.014 1.005 - 1.030   pH 5.0 5.0 - 8.0   Glucose, UA NEGATIVE NEGATIVE mg/dL   Hgb urine dipstick NEGATIVE NEGATIVE   Bilirubin Urine NEGATIVE NEGATIVE   Ketones, ur NEGATIVE NEGATIVE mg/dL   Protein, ur NEGATIVE NEGATIVE mg/dL   Nitrite NEGATIVE NEGATIVE   Leukocytes, UA TRACE (A) NEGATIVE  Urine microscopic-add on     Status: Abnormal   Collection Time: 08/23/15  6:53 AM  Result Value Ref Range   Squamous Epithelial / LPF 6-30 (A) NONE SEEN   WBC, UA 0-5 0 - 5 WBC/hpf   RBC / HPF 0-5 0 - 5 RBC/hpf   Bacteria, UA FEW (A) NONE SEEN   Casts GRANULAR CAST (A) NEGATIVE   Crystals URIC ACID CRYSTALS (A) NEGATIVE  Heparin level (unfractionated)     Status: Abnormal   Collection Time: 08/23/15  9:37 AM  Result Value Ref Range   Heparin Unfractionated 0.77 (H) 0.30 - 0.70 IU/mL  APTT     Status: Abnormal   Collection Time: 08/23/15  9:37 AM  Result Value Ref Range   aPTT 52 (H) 24 - 37 seconds     Studies/Results Radiology     MEDS, Scheduled . feeding supplement (ENSURE ENLIVE)  237 mL Oral BID BM  . pantoprazole (PROTONIX) IV  40 mg Intravenous Q24H  . piperacillin-tazobactam (ZOSYN)  IV  2.25 g Intravenous 4 times per day  . [START ON 08/24/2015] vancomycin  1,000 mg Intravenous Q48H     Assessment: AKI (acute kidney injury) ([place]) I reviewed her ostomy outputs for the last 2 weeks.  They are not particularly high except for 1  or 2 days.  She is taking her anti-motility agents appropriately.  Unsure why she is having so much trouble with hydration.  Appreciate input and insight from hospitalist team.    Plan: I have ordered a PICC to be placed for home IV fluid infusions twice a week.  She should continue lomotil and imodium unless  you feel these may be causing her reflux and nausea issues.  As fas as her wbc goes, she did have a small fluid collection in her pelvis on her last CT.  Given her lack of fevers and pelvic pain, I do not think that she has a worsening abscess but could get a CT pelvis with IV and rectal contrast once pt's renal function improves, if you feel it is warranted during her admission.      LOS: 1 day    Rosario Adie, [facility], [place]   08/23/2015 11:31 AM

## 2015-08-23 NOTE — Progress Notes (Signed)
ANTICOAGULATION CONSULT NOTE - Initial Consult  Pharmacy Consult for Heparin Indication: Arterial thrombosis  Allergies  Allergen Reactions  . Codeine Other (See Comments)    "hyper"    Patient Measurements:   Heparin Dosing Weight:   Vital Signs: Temp: 97.8 F (36.6 C) (04/12 1952) Temp Source: Oral (04/12 1952) BP: 130/82 mmHg (04/12 2158) Pulse Rate: 86 (04/12 2158)  Labs:  Recent Labs  08/22/15 2053  HGB 13.9  HCT 40.8  PLT 443*  CREATININE 3.95*    Estimated Creatinine Clearance: 17 mL/min (by C-G formula based on Cr of 3.95).   Medical History: Past Medical History  Diagnosis Date  . Hypertension   . Hyperlipidemia   . Asthma     pt brought her proair inhaler 06-06-14  . Colon polyp     hyperplastic  . 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 Colonic obstruction ([place])   . Depression   . History of hiatal hernia   . Family history of adverse reaction to anesthesia     pts mother experiences nausea and vomiting   . Shortness of breath dyspnea     seasonal   . Anemia   . GERD (gastroesophageal reflux disease)   . Migraine     "q couple years maybe" (08/07/2015)  . Anxiety   . Acute renal failure (ARF) ([place]) 08/07/2015  . Cervical cancer ([place])   . 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 sodium chloride    . sodium chloride    . 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  Patient to have heparin bridge for apixaban, last dose 4/11 PM.  Patient also with ARF, SCr 3.95 on admit. PTT ordered with Heparin level until both correlate due to possible drug-lab interaction between oral anticoagulant (rivaroxaban, edoxaban, or apixaban) and anti-Xa level (aka heparin level)   Goal of Therapy:  Heparin level 0.3-0.7 units/ml aPTT 66-102 seconds Monitor platelets by anticoagulation protocol: Yes   Plan:  No heparin bolus Heparin drip at 1250 units/hr Daily  CBC Heparin level and PTT at [place], [place] 08/23/2015,12:46 AM

## 2015-08-24 DIAGNOSIS — I739 Peripheral vascular disease, unspecified: Secondary | ICD-10-CM

## 2015-08-24 DIAGNOSIS — I7409 Other arterial embolism and thrombosis of abdominal aorta: Secondary | ICD-10-CM

## 2015-08-24 DIAGNOSIS — D72829 Elevated white blood cell count, unspecified: Secondary | ICD-10-CM

## 2015-08-24 DIAGNOSIS — E860 Dehydration: Secondary | ICD-10-CM

## 2015-08-24 DIAGNOSIS — E871 Hypo-osmolality and hyponatremia: Secondary | ICD-10-CM

## 2015-08-24 DIAGNOSIS — Z932 Ileostomy status: Secondary | ICD-10-CM

## 2015-08-24 DIAGNOSIS — E785 Hyperlipidemia, unspecified: Secondary | ICD-10-CM

## 2015-08-24 DIAGNOSIS — R198 Other specified symptoms and signs involving the digestive system and abdomen: Secondary | ICD-10-CM

## 2015-08-24 LAB — COMPREHENSIVE METABOLIC PANEL
ALK PHOS: 158 U/L — AB (ref 38–126)
ALT: 12 U/L — ABNORMAL LOW (ref 14–54)
ANION GAP: 8 (ref 5–15)
AST: 13 U/L — ABNORMAL LOW (ref 15–41)
Albumin: 3 g/dL — ABNORMAL LOW (ref 3.5–5.0)
BILIRUBIN TOTAL: 0.4 mg/dL (ref 0.3–1.2)
BUN: 32 mg/dL — ABNORMAL HIGH (ref 6–20)
CALCIUM: 8.4 mg/dL — AB (ref 8.9–10.3)
CO2: 19 mmol/L — ABNORMAL LOW (ref 22–32)
Chloride: 103 mmol/L (ref 101–111)
Creatinine, Ser: 1.27 mg/dL — ABNORMAL HIGH (ref 0.44–1.00)
GFR calc non Af Amer: 47 mL/min — ABNORMAL LOW (ref >60)
GFR, EST AFRICAN AMERICAN: 55 mL/min — AB (ref >60)
Glucose, Bld: 103 mg/dL — ABNORMAL HIGH (ref 65–99)
Potassium: 4.1 mmol/L (ref 3.5–5.1)
Sodium: 130 mmol/L — ABNORMAL LOW (ref 135–145)
Total Protein: 5.9 g/dL — ABNORMAL LOW (ref 6.5–8.1)

## 2015-08-24 LAB — CBC
HCT: 28.7 % — ABNORMAL LOW (ref 36.0–46.0)
HEMOGLOBIN: 9.4 g/dL — AB (ref 12.0–15.0)
MCH: 25.6 pg — ABNORMAL LOW (ref 26.0–34.0)
MCHC: 32.8 g/dL (ref 30.0–36.0)
MCV: 78.2 fL (ref 78.0–100.0)
Platelets: 264 10*3/uL (ref 150–400)
RBC: 3.67 MIL/uL — AB (ref 3.87–5.11)
RDW: 14.8 % (ref 11.5–15.5)
WBC: 10.4 10*3/uL (ref 4.0–10.5)

## 2015-08-24 MED ORDER — PANTOPRAZOLE SODIUM 40 MG PO TBEC
40.0000 mg | DELAYED_RELEASE_TABLET | Freq: Every day | ORAL | Status: DC
  Administered 2015-08-24: 40 mg via ORAL
  Filled 2015-08-24 (×2): qty 1

## 2015-08-24 MED ORDER — PIPERACILLIN-TAZOBACTAM 3.375 G IVPB: 3.3750 g | Freq: Three times a day (TID) | INTRAVENOUS | Status: DC

## 2015-08-24 MED ORDER — SODIUM CHLORIDE 0.9 % IV BOLUS (SEPSIS)
500.0000 mL | Freq: Once | INTRAVENOUS | Status: AC
Start: 2015-08-24 — End: 2015-08-24
  Administered 2015-08-24: 500 mL via INTRAVENOUS

## 2015-08-24 MED ORDER — CHOLESTYRAMINE 4 G PO PACK
4.0000 g | PACK | ORAL | Status: DC
  Administered 2015-08-25: 4 g via ORAL
  Filled 2015-08-24 (×2): qty 1

## 2015-08-24 MED ORDER — SODIUM CHLORIDE 0.9 % IV BOLUS (SEPSIS)
500.0000 mL | Freq: Once | INTRAVENOUS | Status: AC
  Administered 2015-08-24: 500 mL via INTRAVENOUS

## 2015-08-24 MED ORDER — VANCOMYCIN HCL IN DEXTROSE 750-5 MG/150ML-% IV SOLN
750.0000 mg | Freq: Two times a day (BID) | INTRAVENOUS | Status: DC
Start: 2015-08-24 — End: 2015-08-24
  Filled 2015-08-24: qty 150

## 2015-08-24 NOTE — Progress Notes (Signed)
Report given to [facility], RN and all questions answered. Pt family at bedside and updated. All belongings sent with patient.

## 2015-08-24 NOTE — Progress Notes (Signed)
KATALIA GENIN is a 55 y.o. female patient transferred from ICU awake, alert - oriented  X 4 - no acute distress noted.  VSS - Blood pressure 110/47, pulse 68, temperature 97.9 F (36.6 C), temperature source Oral, resp. rate 16, height 5\' 2"  (1.575 m), weight 81.1 kg (178 lb 12.7 oz), SpO2 100 %.    IV in place, occlusive dsg intact without redness.  Orientation to room, and floor completed with information packet given to patient/family.  Patient declined safety video at this time.  Admission INP armband ID verified with patient/family, and in place.   SR up x 2, fall assessment complete, with patient and family able to verbalize understanding of risk associated with falls, and verbalized understanding to call nsg before up out of bed.  Call light within reach, patient able to voice, and demonstrate understanding.  Skin, clean-dry- intact without evidence of bruising, or skin tears.   No evidence of skin break down noted on exam.     Will cont to eval and treat per MD orders.  Deri Fuelling, RN 08/24/2015 2:30 PM

## 2015-08-24 NOTE — Progress Notes (Signed)
[facility]                                                                              Patient Demographics  Cathy Jones, is a 55 y.o. female, DOB - 1960-09-08, [id number]  Admit date - 08/22/2015   Admitting Physician Reubin Milan, MD  Outpatient Primary MD for the patient is Tula Nakayama  LOS - 2   Chief Complaint  Patient presents with  . Near Syncope      HPI on 08/23/2015 by Dr. Tennis Must Cathy Jones is a 55 y.o. female with a past medical history of hypertension, hyperlipidemia, asthma, depression, anxiety known to me from a recent admission when she had abdominal pain, with a CT scan showing splenic infarctions, aortic thrombosis and AKI about a month following a planned LAR and ileostomy who comes to the emergency department with dizziness and feeling dehydrated.   Per patient, she was following with Dr. Marcello Moores today and was going to be admitted directly to the hospital due to nausea, emesis and dehydration and leukocytosis. She states, that she became lightheaded while walking and felt like she was about to pass out, but did not lose consciousness. She denies chest pain, dyspnea, diaphoresis, but states she felt palpitations, nausea associated with her dizziness.   When seen, the patient looks chronically ill, pale and dehydrated, but was in no acute distress. Her workup reveals acidemia and leukocytosis.  Assessment & Plan   Acute kidney injury with metabolic acidosis -secondary to dehydration and high output -Upon admission, creatinine 3.95.  Baseline less than 1. -Currently creatinine 1.27 -Continue to monitor BMP  Dehydration and hyponatremia  -secondary to high output ostomy -Continue IV fluids and monitor BMP  Diverting ileostomy with high output -Gen. surgery consulted and appreciated -PICC line ordered for IV fluid infusions twice weekly -Continue Lomotil, Imodium, fiber -Continue Questran -Monitor output -GI pathogen panel  negative  Leukocytosis -Resolved -No specific source. Suspect this is reactive.  -Patient denies any cough, fever, problems with urination -UA and chest x-ray negative for infection -Started on broad-spectrum antibiotics, will discontinue -Continue to monitor CBC  Abdominal aorta thrombosis -Continue anticoagulation  Peripheral arterial disease -Patient recently status post left lower extremity femoropopliteal -Continue anticoagulation  Hyperlipidemia -Continue statin  Depression/anxiety -Continue Zoloft, Ativan  Anemia -Hb 9.4 (possibly dilutional) -Continue monitor CBC.  -Continue iron supplementation  Code Status: Full  Family Communication: None at bedside  Disposition Plan: Admitted. Will transfer to floor. Possibly discharge in 1-2 days  Time Spent in minutes   30 minutes  Procedures  None   Consults   General sugery, Dr. Marcello Moores  DVT Prophylaxis  Eliquis  Lab Results  Component Value Date   PLT 264 08/24/2015    Medications  Scheduled Meds: . apixaban  5 mg Oral BID  . atorvastatin  10 mg Oral q morning - 10a  . cholestyramine  4 g Oral Q24H  . diphenoxylate-atropine  2 tablet Oral TID AC & HS  . feeding supplement (ENSURE ENLIVE)  237 mL Oral BID BM  . ferrous sulfate  325 mg Oral BID WC  . folic acid  1 mg Oral Daily  . loperamide  4 mg Oral TID AC & HS  . LORazepam  0.5 mg Oral q morning - 10a  . pantoprazole  40 mg Oral QHS  . piperacillin-tazobactam (ZOSYN)  IV  3.375 g Intravenous 3 times per day  . polycarbophil  625 mg Oral BID  . sertraline  100 mg Oral q morning - 10a  . sodium chloride flush  10-40 mL Intracatheter Q12H  . vancomycin  750 mg Intravenous Q12H  . vitamin B-12  100 mcg Oral Daily   Continuous Infusions: . sodium chloride 100 mL/hr at 08/23/15 2344   PRN Meds:.albuterol, HYDROmorphone (DILAUDID) injection, ondansetron **OR** ondansetron (ZOFRAN) IV, ondansetron (ZOFRAN) IV, oxyCODONE, sodium chloride  flush  Antibiotics    Anti-infectives    Start     Dose/Rate Route Frequency Ordered Stop   08/24/15 2200  vancomycin (VANCOCIN) IVPB 1000 mg/200 mL premix  Status:  Discontinued     1,000 mg 200 mL/hr over 60 Minutes Intravenous Every 48 hours 08/23/15 0042 08/24/15 1013   08/24/15 1400  vancomycin (VANCOCIN) IVPB 750 mg/150 ml premix     750 mg 150 mL/hr over 60 Minutes Intravenous Every 12 hours 08/24/15 1013     08/24/15 1400  piperacillin-tazobactam (ZOSYN) IVPB 3.375 g     3.375 g 12.5 mL/hr over 240 Minutes Intravenous 3 times per day 08/24/15 1013     08/23/15 0600  piperacillin-tazobactam (ZOSYN) IVPB 2.25 g  Status:  Discontinued     2.25 g 100 mL/hr over 30 Minutes Intravenous 4 times per day 08/23/15 0042 08/24/15 1013   08/22/15 2200  piperacillin-tazobactam (ZOSYN) IVPB 3.375 g     3.375 g 100 mL/hr over 30 Minutes Intravenous  Once 08/22/15 2156 08/22/15 2323   08/22/15 2200  vancomycin (VANCOCIN) IVPB 1000 mg/200 mL premix     1,000 mg 200 mL/hr over 60 Minutes Intravenous  Once 08/22/15 2156 08/22/15 2353        Subjective:   Cathy Jones seen and examined today.  Patient states she's feeling much better today and feel her output has bulked up and is less.  Denies any chest pain, shortness breath, abdominal pain, dizziness or headache.  Would like to eat solid food.  Objective:   Filed Vitals:   08/24/15 0400 08/24/15 0600 08/24/15 0800 08/24/15 0809  BP: 124/78 99/51 140/82   Pulse: 69 67 74   Temp: 98.9 F (37.2 C)   97.8 F (36.6 C)  TempSrc: Oral   Oral  Resp: 29 16 17    Height:      Weight:      SpO2: 100% 99% 100%     Wt Readings from Last 3 Encounters:  08/23/15 81.1 kg (178 lb 12.7 oz)  08/12/15 88.6 kg (195 lb 5.2 oz)  08/01/15 87.726 kg (193 lb 6.4 oz)     Intake/Output Summary (Last 24 hours) at 08/24/15 1030 Last data filed at 08/24/15 0700  Gross per 24 hour  Intake 3281.68 ml  Output   1725 ml  Net 1556.68 ml     Exam  General: Well developed, well nourished, NAD, appears stated age  41: NCAT,  mucous membranes moist.   Cardiovascular: S1 S2 auscultated, no murmurs, RRR  Respiratory: Clear to auscultation bilaterally  Abdomen: Soft, nontender, nondistended, + bowel sounds, +ostomy  Extremities: warm dry without cyanosis clubbing or edema  Neuro: AAOx3, nonfocal  Psych: Normal affect and demeanor, pleasant   Data Review   Micro Results Recent Results (from the past 240 hour(s))  MRSA PCR Screening     Status: None   Collection Time: 08/23/15 12:21 AM  Result Value Ref Range Status   MRSA by PCR NEGATIVE NEGATIVE Final    Comment:        The GeneXpert MRSA Assay (FDA approved for NASAL specimens only), is one component of a comprehensive MRSA colonization surveillance program. It is not intended to diagnose MRSA infection nor to guide or monitor treatment for MRSA infections.   Gastrointestinal Panel by PCR , Stool     Status: None   Collection Time: 08/23/15 12:46 AM  Result Value Ref Range Status   Campylobacter species NOT DETECTED NOT DETECTED Final   Plesimonas shigelloides NOT DETECTED NOT DETECTED Final   Salmonella species NOT DETECTED NOT DETECTED Final   Yersinia enterocolitica NOT DETECTED NOT DETECTED Final   Vibrio species NOT DETECTED NOT DETECTED Final   Vibrio cholerae NOT DETECTED NOT DETECTED Final   Enteroaggregative E coli (EAEC) NOT DETECTED NOT DETECTED Final   Enteropathogenic E coli (EPEC) NOT DETECTED NOT DETECTED Final   Enterotoxigenic E coli (ETEC) NOT DETECTED NOT DETECTED Final   Shiga like toxin producing E coli (STEC) NOT DETECTED NOT DETECTED Final   E. coli O157 NOT DETECTED NOT DETECTED Final   Shigella/Enteroinvasive E coli (EIEC) NOT DETECTED NOT DETECTED Final   Cryptosporidium NOT DETECTED NOT DETECTED Final   Cyclospora cayetanensis NOT DETECTED NOT DETECTED Final   Entamoeba histolytica NOT DETECTED NOT DETECTED Final    Giardia lamblia NOT DETECTED NOT DETECTED Final   Adenovirus F40/41 NOT DETECTED NOT DETECTED Final   Astrovirus NOT DETECTED NOT DETECTED Final   Norovirus GI/GII NOT DETECTED NOT DETECTED Final   Rotavirus A NOT DETECTED NOT DETECTED Final   Sapovirus (I, II, IV, and V) NOT DETECTED NOT DETECTED Final    Radiology Reports Ct Abdomen Pelvis W Contrast  07/30/2015  CLINICAL DATA:  55 year old female with nausea and vomiting for 1 week. Unintentional 50 lbs. weight loss. Mid abdominal pain. History of History of cervical cancer status post hysterectomy. More recent history of short-segment sigmoid colon stricture treated surgically. EXAM: CT ABDOMEN AND PELVIS WITH CONTRAST TECHNIQUE: Multidetector CT imaging of the abdomen and pelvis was performed using the standard protocol following bolus administration of intravenous contrast. CONTRAST:  157mL ISOVUE-300 IOPAMIDOL (ISOVUE-300) INJECTION 61% COMPARISON:  CT virtual colonoscopy 07/19/2014. CT Abdomen and Pelvis 06/13/2014. FINDINGS: Lung bases are stable since 2016 and negative. No pericardial or pleural effusion. Stable mild elevation of the left hemidiaphragm. Stable visualized osseous structures. L3 limbus vertebra. Chronic multilevel vertebral endplate irregularity most pronounced at L4. No acute or suspicious osseous lesion identified. Since March 2016 the patient has undergone ventral abdominal surgery with right abdominal ileostomy. There is new confluent presacral stranding. Chronic retroperitoneal and pelvic sidewall dissection with numerous surgical clips otherwise appears stable. Evidence of a sigmoid colectomy with staple line at what appears to be a sigmoid to rectum anastomosis (series 3, image 69). Along the right pelvic sidewall lateral to the apparent anastomosis there is an 18 x 42 mm fluid collection or less likely residual fluid-filled bowel loop (series 3, image 68). The uterus and adnexa are chronically surgically absent. There is a  small volume of gas within the vagina. The urinary bladder is completely decompressed today. The left colon is decompressed. The transverse colon is decompressed. The right colon is decompressed. The distal small bowel is decompressed. The ileostomy has no adverse features there. No dilated small bowel loops. Is mild  fluid distension of the stomach no pneumoperitoneum or abdominal free fluid. Liver, gallbladder, pancreas and adrenal glands are within normal limits. Portal venous system is patent. The spleen is remarkable for a mildly lobulated but wedge-shaped and peripheral hypodense area along the posterior margin which may be new since 2016. No surrounding inflammation. More subtle peripheral wedge-shaped hypodense area also on image 19 along the anterior contour, and image 12 at the superior pole of the spleen. No left upper quadrant fluid. Extensive Aortoiliac calcified atherosclerosis noted. Major arterial structures appear to remain patent. However, there is newly seen low-density bulky mural plaque or thrombus along the posterior wall of the descending thoracic aorta measuring 6 x 11 x 10 mm (AP by transverse by CC), new since 2016. Stable residual small retroperitoneal lymph nodes. Bilateral renal enhancement is symmetric and within normal limits. However, despite up to 7 minute delayed imaging through the kidneys no renal contrast excretion occurred. However, at the same time there is no hydronephrosis or hydroureter. Both proximal ureters are diminutive. Both track into the postoperative retroperitoneal changes an are poorly delineated distally. There is no perinephric stranding. The interval postoperative changes to the ventral abdominal wall are noted with no adverse features. IMPRESSION: 1. Evidence of multiple small splenic infarcts. Associated new soft mural plaque or thrombus in the descending thoracic aorta (series 3, image 3) suspicious for embolic source. Recommend vascular surgery consultation.  2. Interval postoperative changes to the pelvis, including apparent partial sigmoidectomy and reanastomosis. There is an elongated extraluminal fluid collection beginning posterior to the anastomosis site (in the midline presacral space) and tracking along the posterior right pelvic sidewall. This is of unclear etiology and significance. 3. Suspect acute renal insufficiency on the basis of intrinsic renal disease suspected in light of no contrast excretion to from the kidneys up to 7 minutes following injection. No evidence of obstructive uropathy. 4. New right abdominal ileostomy with no bowel obstruction. The above was discussed by telephone with Dr. Leighton Ruff on [id number] at 15:14 . Electronically Signed   By: Genevie Ann M.D.   On: 07/30/2015 15:15   US Renal  07/31/2015  CLINICAL DATA:  Acute renal failure.  Hypertension. EXAM: RENAL / URINARY TRACT ULTRASOUND COMPLETE COMPARISON:  None. FINDINGS: Right Kidney: Length: 10.1 cm. Echogenicity within normal limits. No mass or hydronephrosis visualized. Left Kidney: Length: 11.1 cm. Echogenicity within normal limits. No mass or hydronephrosis visualized. Bladder: Appears normal for degree of bladder distention. IMPRESSION: Normal renal ultrasound.  No hydronephrosis. Electronically Signed   By: Suzy Bouchard M.D.   On: 07/31/2015 08:43   Dg Chest Port 1 View  08/23/2015  CLINICAL DATA:  Acute onset of sepsis. Assess for source of infection. Initial encounter. EXAM: PORTABLE CHEST 1 VIEW COMPARISON:  Chest radiograph performed 10/13/2013 FINDINGS: The lungs are well-aerated and clear. There is no evidence of focal opacification, pleural effusion or pneumothorax. The cardiomediastinal silhouette is within normal limits. No acute osseous abnormalities are seen. IMPRESSION: No acute cardiopulmonary process seen. Electronically Signed   By: Garald Balding M.D.   On: 08/23/2015 01:31   Dg Abd 2 Views  08/10/2015  CLINICAL DATA:  55 year old female undergoing  intraoperative arteriogram EXAM: DG C-ARM 61-120 MIN; ABDOMEN - 2 VIEW COMPARISON:  CT abdomen/ pelvis 07/30/2015 FINDINGS: A cine loop demonstrates a vascular sheath in the common femoral artery. Contrast injection demonstrates a patent common femoral artery. The external iliac artery is occluded in the mid pelvis. Filling of internal iliac artery branches is noted  via deep circumflex iliac collaterals. The inferior epigastric artery is widely patent. Surgical clips along the right pelvic sidewall suggest prior nodal dissection. The second image demonstrates a wire along the expected path of the external iliac artery. IMPRESSION: Intraoperative arteriogram as above. 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 Electronically Signed   By: Jacqulynn Cadet M.D.   On: 08/10/2015 15:34   Dg C-arm 1-60 Min  08/10/2015  CLINICAL DATA:  55 year old female undergoing intraoperative arteriogram EXAM: DG C-ARM 61-120 MIN; ABDOMEN - 2 VIEW COMPARISON:  CT abdomen/ pelvis 07/30/2015 FINDINGS: A cine loop demonstrates a vascular sheath in the common femoral artery. Contrast injection demonstrates a patent common femoral artery. The external iliac artery is occluded in the mid pelvis. Filling of internal iliac artery branches is noted via deep circumflex iliac collaterals. The inferior epigastric artery is widely patent. Surgical clips along the right pelvic sidewall suggest prior nodal dissection. The second image demonstrates a wire along the expected path of the external iliac artery. IMPRESSION: Intraoperative arteriogram as above. 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 Electronically Signed   By: Jacqulynn Cadet M.D.   On: 08/10/2015 15:34    CBC  Recent Labs Lab 08/22/15 2053 08/23/15 0321 08/24/15 0745  WBC 32.1* 23.2* 10.4  HGB 13.9 10.7* 9.4*  HCT 40.8 31.9* 28.7*  PLT 443* 314 264  MCV 78.2 78.6 78.2  MCH 26.6 26.4 25.6*  MCHC 34.1 33.5 32.8    RDW 14.7 14.7 14.8  LYMPHSABS  --  3.5  --   MONOABS  --  1.7*  --   EOSABS  --  0.2  --   BASOSABS  --  0.0  --     Chemistries   Recent Labs Lab 08/22/15 2053 08/23/15 0321 08/24/15 0745  NA 123* 124* 130*  K 4.8 4.2 4.1  CL 90* 91* 103  CO2 16* 17* 19*  GLUCOSE 177* 124* 103*  BUN 73* 65* 32*  CREATININE 3.95* 2.95* 1.27*  CALCIUM 10.2 8.7* 8.4*  AST  --  13* 13*  ALT  --  12* 12*  ALKPHOS  --  189* 158*  BILITOT  --  0.6 0.4   ------------------------------------------------------------------------------------------------------------------ estimated creatinine clearance is 50.5 mL/min (by C-G formula based on Cr of 1.27). ------------------------------------------------------------------------------------------------------------------ No results for input(s): HGBA1C in the last 72 hours. ------------------------------------------------------------------------------------------------------------------ No results for input(s): CHOL, HDL, LDLCALC, TRIG, CHOLHDL, LDLDIRECT in the last 72 hours. ------------------------------------------------------------------------------------------------------------------ No results for input(s): TSH, T4TOTAL, T3FREE, THYROIDAB in the last 72 hours.  Invalid input(s): FREET3 ------------------------------------------------------------------------------------------------------------------ No results for input(s): VITAMINB12, FOLATE, FERRITIN, TIBC, IRON, RETICCTPCT in the last 72 hours.  Coagulation profile No results for input(s): INR, PROTIME in the last 168 hours.  No results for input(s): DDIMER in the last 72 hours.  Cardiac Enzymes No results for input(s): CKMB, TROPONINI, MYOGLOBIN in the last 168 hours.  Invalid input(s): CK ------------------------------------------------------------------------------------------------------------------ Invalid input(s): POCBNP    Arloa Prak D.O. on 08/24/2015 at 10:30  AM  Between 7am to 7pm - Pager - [phone]  After 7pm go to [email or web address] - password [email or web address]  And look for the night coverage person covering for me after hours  [facility] Office  [phone]

## 2015-08-24 NOTE — Progress Notes (Signed)
Pharmacy Antibiotic Note  Cathy Jones is a 55 y.o. female admitted on 08/22/2015 with sepsis.  Pharmacy has been consulted for Vancomycin and Zosyn on 4/13. SCr has improved requiring dose adjustment of abx's but with improved labs and afebrile should consider discontinue of abx's  Plan: 1) Change Zosyn from 2.25g IV q6 to 3.375g IV q8 EI 2) Change Vancomycin from 1g q48 to 750mg  IV q12 but would recommend d/c of vanc at least due to negative MRSA PCR  Height: 5\' 2"  (157.5 cm) Weight: 178 lb 12.7 oz (81.1 kg) IBW/kg (Calculated) : 50.1  Temp (24hrs), Avg:98.5 F (36.9 C), Min:97.5 F (36.4 C), Max:99.4 F (37.4 C)   Recent Labs Lab 08/22/15 2053 08/22/15 2232 08/23/15 0321 08/24/15 0745  WBC 32.1*  --  23.2* 10.4  CREATININE 3.95*  --  2.95* 1.27*  LATICACIDVEN  --  0.94  --   --     Estimated Creatinine Clearance: 50.5 mL/min (by C-G formula based on Cr of 1.27).    Allergies  Allergen Reactions  . Codeine Other (See Comments)    "hyper"    Antimicrobials this admission: Vancomycin 4/12 >> Zosyn 4/12 >>   Microbiology results: Pending   Thank you for allowing pharmacy to be a part of this patient's care.   Adrian Saran, PharmD, BCPS Pager [phone] 08/24/2015 10:11 AM

## 2015-08-24 NOTE — Progress Notes (Signed)
Report Received by [place]. Patient stable and on her way to the unit.  Deri Fuelling, RN

## 2015-08-24 NOTE — Progress Notes (Addendum)
Subjective: Pt feels much better  No abdominal pain   Objective: Vital signs in last 24 hours: Temp:  [97.5 F (36.4 C)-99.4 F (37.4 C)] 98.9 F (37.2 C) (04/14 0400) Pulse Rate:  [67-85] 67 (04/14 0600) Resp:  [13-29] 16 (04/14 0600) BP: (99-136)/(46-78) 99/51 mmHg (04/14 0600) SpO2:  [97 %-100 %] 99 % (04/14 0600) Last BM Date: 08/24/15  Intake/Output from previous day: 04/13 0701 - 04/14 0700 In: 3281.7 [P.O.:1080; I.V.:2051.7; IV Piggyback:150] Out: 1725 [Urine:600; Stool:1125] Intake/Output this shift:    Incision/Wound:non tender  Wounds healing  Ostomy  Viable functioning   Lab Results:   Recent Labs  08/22/15 2053 08/23/15 0321  WBC 32.1* 23.2*  HGB 13.9 10.7*  HCT 40.8 31.9*  PLT 443* 314   BMET  Recent Labs  08/22/15 2053 08/23/15 0321  NA 123* 124*  K 4.8 4.2  CL 90* 91*  CO2 16* 17*  GLUCOSE 177* 124*  BUN 73* 65*  CREATININE 3.95* 2.95*  CALCIUM 10.2 8.7*   PT/INR No results for input(s): LABPROT, INR in the last 72 hours. ABG No results for input(s): PHART, HCO3 in the last 72 hours.  Invalid input(s): PCO2, PO2  Studies/Results: Dg Chest Port 1 View  08/23/2015  CLINICAL DATA:  Acute onset of sepsis. Assess for source of infection. Initial encounter. EXAM: PORTABLE CHEST 1 VIEW COMPARISON:  Chest radiograph performed 10/13/2013 FINDINGS: The lungs are well-aerated and clear. There is no evidence of focal opacification, pleural effusion or pneumothorax. The cardiomediastinal silhouette is within normal limits. No acute osseous abnormalities are seen. IMPRESSION: No acute cardiopulmonary process seen. Electronically Signed   By: Garald Balding M.D.   On: 08/23/2015 01:31    Anti-infectives: Anti-infectives    Start     Dose/Rate Route Frequency Ordered Stop   08/24/15 2200  vancomycin (VANCOCIN) IVPB 1000 mg/200 mL premix     1,000 mg 200 mL/hr over 60 Minutes Intravenous Every 48 hours 08/23/15 0042     08/23/15 0600   piperacillin-tazobactam (ZOSYN) IVPB 2.25 g     2.25 g 100 mL/hr over 30 Minutes Intravenous 4 times per day 08/23/15 0042     08/22/15 2200  piperacillin-tazobactam (ZOSYN) IVPB 3.375 g     3.375 g 100 mL/hr over 30 Minutes Intravenous  Once 08/22/15 2156 08/22/15 2323   08/22/15 2200  vancomycin (VANCOCIN) IVPB 1000 mg/200 mL premix     1,000 mg 200 mL/hr over 60 Minutes Intravenous  Once 08/22/15 2156 08/22/15 2353      Assessment/Plan: Patient Active Problem List   Diagnosis Date Noted  . Dehydration 08/23/2015  . PAD (peripheral artery disease) ([place]) 08/10/2015  . Arterial occlusion ([place]) 08/07/2015  . Leukocytosis 08/07/2015  . High output ileostomy ([place]) 08/04/2015  . B12 deficiency anemia 08/02/2015  . Folic acid deficiency anemia 08/02/2015  . AKI (acute kidney injury) ([place])   . Hyperlipidemia 07/30/2015  . Depression 07/30/2015  . Hyponatremia 07/30/2015  . Hyperkalemia 07/30/2015  . S/P ileostomy ([place]) 07/30/2015  . S/P partial resection of colon 07/30/2015  . 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 Rectal stricture s/p LAR resection [id number] 06/27/2015    Looks and feels better Ok to send home when medically stable  Output less  PICC line in place for home IVF May need 1 L /day FOR THE NEXT 2 WEEKS NEEDS FOLLOW UP WITH DR Marcello Moores IN 7 - 10 DAYS    LOS: 2 days    Kaavya Puskarich  A. 08/24/2015

## 2015-08-25 LAB — BASIC METABOLIC PANEL
ANION GAP: 6 (ref 5–15)
BUN: 15 mg/dL (ref 6–20)
CHLORIDE: 110 mmol/L (ref 101–111)
CO2: 19 mmol/L — ABNORMAL LOW (ref 22–32)
Calcium: 7.8 mg/dL — ABNORMAL LOW (ref 8.9–10.3)
Creatinine, Ser: 0.87 mg/dL (ref 0.44–1.00)
GFR calc Af Amer: >60 mL/min (ref >60)
GFR calc non Af Amer: >60 mL/min (ref >60)
GLUCOSE: 121 mg/dL — AB (ref 65–99)
POTASSIUM: 4 mmol/L (ref 3.5–5.1)
SODIUM: 135 mmol/L (ref 135–145)

## 2015-08-25 LAB — CBC
HCT: 25.3 % — ABNORMAL LOW (ref 36.0–46.0)
HEMOGLOBIN: 8.3 g/dL — AB (ref 12.0–15.0)
MCH: 26.4 pg (ref 26.0–34.0)
MCHC: 32.8 g/dL (ref 30.0–36.0)
MCV: 80.6 fL (ref 78.0–100.0)
Platelets: 228 10*3/uL (ref 150–400)
RBC: 3.14 MIL/uL — AB (ref 3.87–5.11)
RDW: 15.2 % (ref 11.5–15.5)
WBC: 10.2 10*3/uL (ref 4.0–10.5)

## 2015-08-25 MED ORDER — PANTOPRAZOLE SODIUM 40 MG PO TBEC
40.0000 mg | DELAYED_RELEASE_TABLET | Freq: Every day | ORAL | Status: DC
Start: 2015-08-25 — End: 2015-12-03

## 2015-08-25 MED ORDER — SODIUM CHLORIDE 0.9 % IV BOLUS (SEPSIS)
500.0000 mL | Freq: Once | INTRAVENOUS | Status: AC
  Administered 2015-08-25: 500 mL via INTRAVENOUS

## 2015-08-25 MED ORDER — ONDANSETRON HCL 4 MG PO TABS: 4.0000 mg | ORAL_TABLET | Freq: Four times a day (QID) | ORAL | Status: DC | PRN

## 2015-08-25 MED ORDER — HEPARIN SOD (PORK) LOCK FLUSH 100 UNIT/ML IV SOLN
250.0000 [IU] | INTRAVENOUS | Status: AC | PRN
  Administered 2015-08-25: 250 [IU]

## 2015-08-25 MED ORDER — CHOLESTYRAMINE 4 G PO PACK: 4.0000 g | PACK | ORAL | Status: DC

## 2015-08-25 NOTE — Discharge Instructions (Signed)
Acute Kidney Injury °Acute kidney injury is any condition in which there is sudden (acute) damage to the kidneys. Acute kidney injury was previously known as acute kidney failure or acute renal failure. The kidneys are two organs that lie on either side of the spine between the middle of the back and the front of the abdomen. The kidneys: °· Remove wastes and extra water from the blood.   °· Produce important hormones. These help keep bones strong, regulate blood pressure, and help create red blood cells.   °· Balance the fluids and chemicals in the blood and tissues. °A small amount of kidney damage may not cause problems, but a large amount of damage may make it difficult or impossible for the kidneys to work the way they should. Acute kidney injury may develop into long-lasting (chronic) kidney disease. It may also develop into a life-threatening disease called end-stage kidney disease. Acute kidney injury can get worse very quickly, so it should be treated right away. Early treatment may prevent other kidney diseases from developing. °CAUSES  °· A problem with blood flow to the kidneys. This may be caused by:   °¨ Blood loss.   °¨ Heart disease.   °¨ Severe burns.   °¨ Liver disease. °· Direct damage to the kidneys. This may be caused by: °¨ Some medicines.   °¨ A kidney infection.   °¨ Poisoning or consuming toxic substances.   °¨ A surgical wound.   °¨ A blow to the kidney area.   °· A problem with urine flow. This may be caused by:   °¨ Cancer.   °¨ Kidney stones.   °¨ An enlarged prostate. °SIGNS AND SYMPTOMS  °· Swelling (edema) of the legs, ankles, or feet.   °· Tiredness (lethargy).   °· Nausea or vomiting.   °· Confusion.   °· Problems with urination, such as:   °¨ Painful or burning feeling during urination.   °¨ Decreased urine production.   °¨ Frequent accidents in children who are potty trained.   °¨ Bloody urine.   °· Muscle twitches and cramps.   °· Shortness of breath.   °· Seizures.   °· Chest  pain or pressure. °Sometimes, no symptoms are present.  °DIAGNOSIS °Acute kidney injury may be detected and diagnosed by tests, including blood, urine, imaging, or kidney biopsy tests.  °TREATMENT °Treatment of acute kidney injury varies depending on the cause and severity of the kidney damage. In mild cases, no treatment may be needed. The kidneys may heal on their own. If acute kidney injury is more severe, your health care provider will treat the cause of the kidney damage, help the kidneys heal, and prevent complications from occurring. Severe cases may require a procedure to remove toxic wastes from the body (dialysis) or surgery to repair kidney damage. Surgery may involve:  °· Repair of a torn kidney.   °· Removal of an obstruction. °HOME CARE INSTRUCTIONS °· Follow your prescribed diet. °· Take medicines only as directed by your health care provider.  °· Do not take any new medicines (prescription, over-the-counter, or nutritional supplements) unless approved by your health care provider. Many medicines can worsen your kidney damage or may need to have the dose adjusted.   °· Keep all follow-up visits as directed by your health care provider. This is important. °· Observe your condition to make sure you are healing as expected. °SEEK IMMEDIATE MEDICAL CARE IF: °· You are feeling ill or have severe pain in the back or side.   °· Your symptoms return or you have new symptoms. °· You have any symptoms of end-stage kidney disease. These include:   °¨ Persistent itchiness.   °¨   Loss of appetite.   °¨ Headaches.   °¨ Abnormally dark or light skin. °¨ Numbness in the hands or feet.   °¨ Easy bruising.   °¨ Frequent hiccups.   °¨ Menstruation stops.   °· You have a fever. °· You have increased urine production. °· You have pain or bleeding when urinating. °MAKE SURE YOU:  °· Understand these instructions. °· Will watch your condition. °· Will get help right away if you are not doing well or get worse. °  °This  information is not intended to replace advice given to you by your health care provider. Make sure you discuss any questions you have with your health care provider. °  °Document Released: 11/11/2010 Document Revised: 05/19/2014 Document Reviewed: 12/26/2011 °Elsevier Interactive Patient Education ©2016 [employer] ° °

## 2015-08-25 NOTE — Progress Notes (Signed)
Patient ID: Cathy Jones, female   DOB: Jan 01, 1961, 55 y.o.   MRN: [id number]  [facility]  Subjective: HD#4 - patient on floor, comfortable, up in chair, no complaints.  Not aware of low BP.  Tolerating diet. Ileostomy output markedly thickened.  Objective: Vital signs in last 24 hours: Temp:  [97.7 F (36.5 C)-98 F (36.7 C)] 97.7 F (36.5 C) (04/15 0529) Pulse Rate:  [68-90] 70 (04/15 0837) Resp:  [16-18] 18 (04/15 0529) BP: (67-110)/(40-48) 97/44 mmHg (04/15 0837) SpO2:  [96 %-100 %] 100 % (04/15 0529) Last BM Date: 08/24/15  Intake/Output from previous day: 04/14 0701 - 04/15 0700 In: 2900 [I.V.:2400; IV Piggyback:500] Out: 3075 [Urine:2000; Stool:1075] Intake/Output this shift:    Physical Exam: HEENT - sclerae clear, mucous membranes moist Abdomen - soft, ostomy viable, pasty succus in bag Ext - no edema, non-tender Neuro - alert & oriented, no focal deficits  Lab Results:   Recent Labs  08/24/15 0745 08/25/15 0400  WBC 10.4 10.2  HGB 9.4* 8.3*  HCT 28.7* 25.3*  PLT 264 228   BMET  Recent Labs  08/24/15 0745 08/25/15 0400  NA 130* 135  K 4.1 4.0  CL 103 110  CO2 19* 19*  GLUCOSE 103* 121*  BUN 32* 15  CREATININE 1.27* 0.87  CALCIUM 8.4* 7.8*   PT/INR No results for input(s): LABPROT, INR in the last 72 hours. Comprehensive Metabolic Panel:    Component Value Date/Time   NA 135 08/25/2015 0400   NA 130* 08/24/2015 0745   K 4.0 08/25/2015 0400   K 4.1 08/24/2015 0745   CL 110 08/25/2015 0400   CL 103 08/24/2015 0745   CO2 19* 08/25/2015 0400   CO2 19* 08/24/2015 0745   BUN 15 08/25/2015 0400   BUN 32* 08/24/2015 0745   CREATININE 0.87 08/25/2015 0400   CREATININE 1.27* 08/24/2015 0745   GLUCOSE 121* 08/25/2015 0400   GLUCOSE 103* 08/24/2015 0745   CALCIUM 7.8* 08/25/2015 0400   CALCIUM 8.4* 08/24/2015 0745   AST 13* 08/24/2015 0745   AST 13* 08/23/2015 0321   ALT 12* 08/24/2015 0745   ALT 12*  08/23/2015 0321   ALKPHOS 158* 08/24/2015 0745   ALKPHOS 189* 08/23/2015 0321   BILITOT 0.4 08/24/2015 0745   BILITOT 0.6 08/23/2015 0321   PROT 5.9* 08/24/2015 0745   PROT 6.5 08/23/2015 0321   ALBUMIN 3.0* 08/24/2015 0745   ALBUMIN 3.3* 08/23/2015 0321    Studies/Results: No results found.  Assessment & Plans: Dehydration secondary to high ileostomy output following LAR  Improved with IV hydration  Ileostomy output more controlled on current regimen  PICC line in place for home IV infusions  Will arrange follow up with Dr. Leighton Ruff at [place] office 1 week  OK for discharge home from surgical standpoint when medically stable - ?BP this AM  Earnstine Regal, MD, [facility] Office: [place] 08/25/2015

## 2015-08-25 NOTE — Progress Notes (Signed)
Pt's BP running low throughout the whole shift. Orders for (2) 500 ml NS bolus administered. Minimal change noted. Pt asymptomatic. Educated pt to have someone with her when she gets OOB and to get up slowly. Pt reports no c/o dizziness or light headedness. Will continue to monitor and pass along to oncoming shift.

## 2015-08-25 NOTE — Discharge Summary (Signed)
Physician Discharge Summary  Cathy Jones [id number] DOB: 07-17-1960 DOA: 08/22/2015  PCP: Tula Nakayama  Admit date: 08/22/2015 Discharge date: 08/25/2015  Time spent: 45 minutes  Recommendations for Outpatient Follow-up:  Patient will be discharged to home with home health RN for IV fluids.  Patient will need to follow up with primary care provider within one week of discharge, repeat CBC and BMP.  Follow up with Dr. Marcello Moores, surgery, in one week. Patient should continue medications as prescribed.  Patient should follow a regular diet.   Discharge Diagnoses:  Acute kidney injury with metabolic acidosis Dehydration and hyponatremia  Diverting ileostomy with high output Leukocytosis Abdominal aorta thrombosis Peripheral arterial disease Hyperlipidemia Depression/anxiety Anemia  Discharge Condition: Stable  Diet recommendation: Regular  Filed Weights   08/23/15 0040  Weight: 81.1 kg (178 lb 12.7 oz)    History of present illness:  on 08/23/2015 by Dr. Tennis Must Cathy Jones is a 55 y.o. female with a past medical history of hypertension, hyperlipidemia, asthma, depression, anxiety known to me from a recent admission when she had abdominal pain, with a CT scan showing splenic infarctions, aortic thrombosis and AKI about a month following a planned LAR and ileostomy who comes to the emergency department with dizziness and feeling dehydrated.   Per patient, she was following with Dr. Marcello Moores today and was going to be admitted directly to the hospital due to nausea, emesis and dehydration and leukocytosis. She states, that she became lightheaded while walking and felt like she was about to pass out, but did not lose consciousness. She denies chest pain, dyspnea, diaphoresis, but states she felt palpitations, nausea associated with her dizziness.   When seen, the patient looks chronically ill, pale and dehydrated, but was in no acute distress. Her workup reveals acidemia  and leukocytosis.  Hospital Course:  Acute kidney injury with metabolic acidosis -secondary to dehydration and high output -Upon admission, creatinine 3.95. Baseline less than 1. -Currently creatinine 0.87  Dehydration and hyponatremia  -secondary to high output ostomy -Continue IV fluids and monitor BMP  Diverting ileostomy with high output -Gen. surgery consulted and appreciated -PICC line ordered for IV fluid infusions twice weekly -Continue Lomotil, Imodium, fiber -Continue Questran -Monitor output -GI pathogen panel negative  Leukocytosis -Resolved -No specific source. Suspect this is reactive.  -Patient denies any cough, fever, problems with urination -UA and chest x-ray negative for infection -Started on broad-spectrum antibiotics, will discontinue -Continue to monitor CBC  Abdominal aorta thrombosis -Continue anticoagulation  Peripheral arterial disease -Patient recently status post left lower extremity femoropopliteal -Continue anticoagulation  Hyperlipidemia -Continue statin  Depression/anxiety -Continue Zoloft, Ativan  Anemia -Hb 8.3 (possibly dilutional), baseline Hb between 8-9 -Continue iron supplementation  Procedures  None   Consults  General sugery, Dr. Marcello Moores  Discharge Exam: Filed Vitals:   08/25/15 0554 08/25/15 0837  BP: 76/44 97/44  Pulse:  70  Temp:    Resp:     Exam  General: Well developed, well nourished, NAD, appears stated age  HEENT: NCAT, mucous membranes moist.   Cardiovascular: S1 S2 auscultated, no murmurs, RRR  Respiratory: Clear to auscultation bilaterally  Abdomen: Soft, nontender, nondistended, + bowel sounds, +ostomy  Extremities: warm dry without cyanosis clubbing or edema  Neuro: AAOx3, nonfocal  Psych: Normal affect and demeanor, pleasant  Discharge Instructions      Discharge Instructions    Discharge instructions    Complete by:  As directed   Patient will be discharged to home with  home  health RN for IV fluids.  Patient will need to follow up with primary care provider within one week of discharge, repeat CBC and BMP.  Follow up with Dr. Marcello Moores, surgery, in one week. Patient should continue medications as prescribed.  Patient should follow a regular diet.            Medication List    STOP taking these medications        ibuprofen 200 MG tablet  Commonly known as:  ADVIL,MOTRIN     nystatin 100000 UNIT/GM Powd     paregoric 2 MG/5ML solution      TAKE these medications        apixaban 5 MG Tabs tablet  Commonly known as:  ELIQUIS  Take 1 tablet (5 mg total) by mouth 2 (two) times daily.     atorvastatin 10 MG tablet  Commonly known as:  LIPITOR  Take 10 mg by mouth every morning.     cholestyramine 4 g packet  Commonly known as:  QUESTRAN  Take 1 packet (4 g total) by mouth daily.     cyanocobalamin 1000 MCG tablet  Take 0.5 tablets (500 mcg total) by mouth daily.     diphenoxylate-atropine 2.5-0.025 MG tablet  Commonly known as:  LOMOTIL  Take 2 tablets by mouth 4 (four) times daily -  before meals and at bedtime.     ferrous sulfate 325 (65 FE) MG tablet  Take 1 tablet (325 mg total) by mouth 2 (two) times daily with a meal.     folic acid 1 MG tablet  Commonly known as:  FOLVITE  Take 1 tablet (1 mg total) by mouth daily.     loperamide 2 MG capsule  Commonly known as:  IMODIUM  Take 2 capsules (4 mg total) by mouth 4 (four) times daily -  before meals and at bedtime.     LORazepam 0.5 MG tablet  Commonly known as:  ATIVAN  Take 0.5 mg by mouth every morning.     ondansetron 4 MG tablet  Commonly known as:  ZOFRAN  Take 1 tablet (4 mg total) by mouth every 6 (six) hours as needed for nausea.     oxyCODONE 5 MG immediate release tablet  Commonly known as:  Oxy IR/ROXICODONE  Take 1 tablet (5 mg total) by mouth every 4 (four) hours as needed for moderate pain.     pantoprazole 40 MG tablet  Commonly known as:  PROTONIX  Take 1  tablet (40 mg total) by mouth at bedtime.     polycarbophil 625 MG tablet  Commonly known as:  FIBERCON  Take 1 tablet (625 mg total) by mouth 2 (two) times daily.     PROAIR HFA 108 (90 Base) MCG/ACT inhaler  Generic drug:  albuterol  Inhale 1-2 puffs into the lungs every 4 (four) hours as needed for wheezing or shortness of breath.     sertraline 100 MG tablet  Commonly known as:  ZOLOFT  Take one tablet by mouth every morning.       Allergies  Allergen Reactions  . Codeine Other (See Comments)    "hyper"   Follow-up Information    Follow up with KAPLAN,KRISTEN, PA-C. Schedule an appointment as soon as possible for a visit in 1 week.   Specialty:  Family Medicine   Why:  Hospital follow up   Contact information:   [place] [address] [phone]       Follow up with  Rosario Adie., MD. Schedule an appointment as soon as possible for a visit in 1 week.   Specialty:  General Surgery   Why:  Hospital follow up   Contact information:   [place] [address] [phone]        The results of significant diagnostics from this hospitalization (including imaging, microbiology, ancillary and laboratory) are listed below for reference.    Significant Diagnostic Studies: Ct Abdomen Pelvis W Contrast  07/30/2015  CLINICAL DATA:  55 year old female with nausea and vomiting for 1 week. Unintentional 50 lbs. weight loss. Mid abdominal pain. History of History of cervical cancer status post hysterectomy. More recent history of short-segment sigmoid colon stricture treated surgically. EXAM: CT ABDOMEN AND PELVIS WITH CONTRAST TECHNIQUE: Multidetector CT imaging of the abdomen and pelvis was performed using the standard protocol following bolus administration of intravenous contrast. CONTRAST:  140mL ISOVUE-300 IOPAMIDOL (ISOVUE-300) INJECTION 61% COMPARISON:  CT virtual colonoscopy 07/19/2014. CT Abdomen and Pelvis 06/13/2014. FINDINGS:  Lung bases are stable since 2016 and negative. No pericardial or pleural effusion. Stable mild elevation of the left hemidiaphragm. Stable visualized osseous structures. L3 limbus vertebra. Chronic multilevel vertebral endplate irregularity most pronounced at L4. No acute or suspicious osseous lesion identified. Since March 2016 the patient has undergone ventral abdominal surgery with right abdominal ileostomy. There is new confluent presacral stranding. Chronic retroperitoneal and pelvic sidewall dissection with numerous surgical clips otherwise appears stable. Evidence of a sigmoid colectomy with staple line at what appears to be a sigmoid to rectum anastomosis (series 3, image 69). Along the right pelvic sidewall lateral to the apparent anastomosis there is an 18 x 42 mm fluid collection or less likely residual fluid-filled bowel loop (series 3, image 68). The uterus and adnexa are chronically surgically absent. There is a small volume of gas within the vagina. The urinary bladder is completely decompressed today. The left colon is decompressed. The transverse colon is decompressed. The right colon is decompressed. The distal small bowel is decompressed. The ileostomy has no adverse features there. No dilated small bowel loops. Is mild fluid distension of the stomach no pneumoperitoneum or abdominal free fluid. Liver, gallbladder, pancreas and adrenal glands are within normal limits. Portal venous system is patent. The spleen is remarkable for a mildly lobulated but wedge-shaped and peripheral hypodense area along the posterior margin which may be new since 2016. No surrounding inflammation. More subtle peripheral wedge-shaped hypodense area also on image 19 along the anterior contour, and image 12 at the superior pole of the spleen. No left upper quadrant fluid. Extensive Aortoiliac calcified atherosclerosis noted. Major arterial structures appear to remain patent. However, there is newly seen low-density bulky  mural plaque or thrombus along the posterior wall of the descending thoracic aorta measuring 6 x 11 x 10 mm (AP by transverse by CC), new since 2016. Stable residual small retroperitoneal lymph nodes. Bilateral renal enhancement is symmetric and within normal limits. However, despite up to 7 minute delayed imaging through the kidneys no renal contrast excretion occurred. However, at the same time there is no hydronephrosis or hydroureter. Both proximal ureters are diminutive. Both track into the postoperative retroperitoneal changes an are poorly delineated distally. There is no perinephric stranding. The interval postoperative changes to the ventral abdominal wall are noted with no adverse features. IMPRESSION: 1. Evidence of multiple small splenic infarcts. Associated new soft mural plaque or thrombus in the descending thoracic aorta (series 3, image 3) suspicious for embolic source. Recommend  vascular surgery consultation. 2. Interval postoperative changes to the pelvis, including apparent partial sigmoidectomy and reanastomosis. There is an elongated extraluminal fluid collection beginning posterior to the anastomosis site (in the midline presacral space) and tracking along the posterior right pelvic sidewall. This is of unclear etiology and significance. 3. Suspect acute renal insufficiency on the basis of intrinsic renal disease suspected in light of no contrast excretion to from the kidneys up to 7 minutes following injection. No evidence of obstructive uropathy. 4. New right abdominal ileostomy with no bowel obstruction. The above was discussed by telephone with Dr. Leighton Ruff on [id number] at 15:14 . Electronically Signed   By: Genevie Ann M.D.   On: 07/30/2015 15:15   US Renal  07/31/2015  CLINICAL DATA:  Acute renal failure.  Hypertension. EXAM: RENAL / URINARY TRACT ULTRASOUND COMPLETE COMPARISON:  None. FINDINGS: Right Kidney: Length: 10.1 cm. Echogenicity within normal limits. No mass or  hydronephrosis visualized. Left Kidney: Length: 11.1 cm. Echogenicity within normal limits. No mass or hydronephrosis visualized. Bladder: Appears normal for degree of bladder distention. IMPRESSION: Normal renal ultrasound.  No hydronephrosis. Electronically Signed   By: Suzy Bouchard M.D.   On: 07/31/2015 08:43   Dg Chest Port 1 View  08/23/2015  CLINICAL DATA:  Acute onset of sepsis. Assess for source of infection. Initial encounter. EXAM: PORTABLE CHEST 1 VIEW COMPARISON:  Chest radiograph performed 10/13/2013 FINDINGS: The lungs are well-aerated and clear. There is no evidence of focal opacification, pleural effusion or pneumothorax. The cardiomediastinal silhouette is within normal limits. No acute osseous abnormalities are seen. IMPRESSION: No acute cardiopulmonary process seen. Electronically Signed   By: Garald Balding M.D.   On: 08/23/2015 01:31   Dg Abd 2 Views  08/10/2015  CLINICAL DATA:  55 year old female undergoing intraoperative arteriogram EXAM: DG C-ARM 61-120 MIN; ABDOMEN - 2 VIEW COMPARISON:  CT abdomen/ pelvis 07/30/2015 FINDINGS: A cine loop demonstrates a vascular sheath in the common femoral artery. Contrast injection demonstrates a patent common femoral artery. The external iliac artery is occluded in the mid pelvis. Filling of internal iliac artery branches is noted via deep circumflex iliac collaterals. The inferior epigastric artery is widely patent. Surgical clips along the right pelvic sidewall suggest prior nodal dissection. The second image demonstrates a wire along the expected path of the external iliac artery. IMPRESSION: Intraoperative arteriogram as above. 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 Electronically Signed   By: Jacqulynn Cadet M.D.   On: 08/10/2015 15:34   Dg C-arm 1-60 Min  08/10/2015  CLINICAL DATA:  55 year old female undergoing intraoperative arteriogram EXAM: DG C-ARM 61-120 MIN; ABDOMEN - 2 VIEW COMPARISON:  CT  abdomen/ pelvis 07/30/2015 FINDINGS: A cine loop demonstrates a vascular sheath in the common femoral artery. Contrast injection demonstrates a patent common femoral artery. The external iliac artery is occluded in the mid pelvis. Filling of internal iliac artery branches is noted via deep circumflex iliac collaterals. The inferior epigastric artery is widely patent. Surgical clips along the right pelvic sidewall suggest prior nodal dissection. The second image demonstrates a wire along the expected path of the external iliac artery. IMPRESSION: Intraoperative arteriogram as above. 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 Electronically Signed   By: Jacqulynn Cadet M.D.   On: 08/10/2015 15:34    Microbiology: Recent Results (from the past 240 hour(s))  Blood Culture (routine x 2)     Status: None (Preliminary result)   Collection  Time: 08/22/15 10:21 PM  Result Value Ref Range Status   Specimen Description BLOOD LEFT ANTECUBITAL  Final   Special Requests BOTTLES DRAWN AEROBIC AND ANAEROBIC 5CC  Final   Culture   Final    NO GROWTH 1 DAY Performed at [facility]    Report Status PENDING  Incomplete  Blood Culture (routine x 2)     Status: None (Preliminary result)   Collection Time: 08/22/15 10:40 PM  Result Value Ref Range Status   Specimen Description BLOOD RIGHT ANTECUBITAL  Final   Special Requests BOTTLES DRAWN AEROBIC AND ANAEROBIC 5CC  Final   Culture   Final    NO GROWTH 1 DAY Performed at [facility]    Report Status PENDING  Incomplete  MRSA PCR Screening     Status: None   Collection Time: 08/23/15 12:21 AM  Result Value Ref Range Status   MRSA by PCR NEGATIVE NEGATIVE Final    Comment:        The GeneXpert MRSA Assay (FDA approved for NASAL specimens only), is one component of a comprehensive MRSA colonization surveillance program. It is not intended to diagnose MRSA infection nor to guide or monitor treatment for MRSA  infections.   Gastrointestinal Panel by PCR , Stool     Status: None   Collection Time: 08/23/15 12:46 AM  Result Value Ref Range Status   Campylobacter species NOT DETECTED NOT DETECTED Final   Plesimonas shigelloides NOT DETECTED NOT DETECTED Final   Salmonella species NOT DETECTED NOT DETECTED Final   Yersinia enterocolitica NOT DETECTED NOT DETECTED Final   Vibrio species NOT DETECTED NOT DETECTED Final   Vibrio cholerae NOT DETECTED NOT DETECTED Final   Enteroaggregative E coli (EAEC) NOT DETECTED NOT DETECTED Final   Enteropathogenic E coli (EPEC) NOT DETECTED NOT DETECTED Final   Enterotoxigenic E coli (ETEC) NOT DETECTED NOT DETECTED Final   Shiga like toxin producing E coli (STEC) NOT DETECTED NOT DETECTED Final   E. coli O157 NOT DETECTED NOT DETECTED Final   Shigella/Enteroinvasive E coli (EIEC) NOT DETECTED NOT DETECTED Final   Cryptosporidium NOT DETECTED NOT DETECTED Final   Cyclospora cayetanensis NOT DETECTED NOT DETECTED Final   Entamoeba histolytica NOT DETECTED NOT DETECTED Final   Giardia lamblia NOT DETECTED NOT DETECTED Final   Adenovirus F40/41 NOT DETECTED NOT DETECTED Final   Astrovirus NOT DETECTED NOT DETECTED Final   Norovirus GI/GII NOT DETECTED NOT DETECTED Final   Rotavirus A NOT DETECTED NOT DETECTED Final   Sapovirus (I, II, IV, and V) NOT DETECTED NOT DETECTED Final     Labs: Basic Metabolic Panel:  Recent Labs Lab 08/22/15 2053 08/23/15 0321 08/24/15 0745 08/25/15 0400  NA 123* 124* 130* 135  K 4.8 4.2 4.1 4.0  CL 90* 91* 103 110  CO2 16* 17* 19* 19*  GLUCOSE 177* 124* 103* 121*  BUN 73* 65* 32* 15  CREATININE 3.95* 2.95* 1.27* 0.87  CALCIUM 10.2 8.7* 8.4* 7.8*   Liver Function Tests:  Recent Labs Lab 08/23/15 0321 08/24/15 0745  AST 13* 13*  ALT 12* 12*  ALKPHOS 189* 158*  BILITOT 0.6 0.4  PROT 6.5 5.9*  ALBUMIN 3.3* 3.0*   No results for input(s): LIPASE, AMYLASE in the last 168 hours. No results for input(s): AMMONIA in  the last 168 hours. CBC:  Recent Labs Lab 08/22/15 2053 08/23/15 0321 08/24/15 0745 08/25/15 0400  WBC 32.1* 23.2* 10.4 10.2  NEUTROABS  --  17.8*  --   --  HGB 13.9 10.7* 9.4* 8.3*  HCT 40.8 31.9* 28.7* 25.3*  MCV 78.2 78.6 78.2 80.6  PLT 443* 314 264 228   Cardiac Enzymes: No results for input(s): CKTOTAL, CKMB, CKMBINDEX, TROPONINI in the last 168 hours. BNP: BNP (last 3 results) No results for input(s): BNP in the last 8760 hours.  ProBNP (last 3 results) No results for input(s): PROBNP in the last 8760 hours.  CBG:  Recent Labs Lab 08/22/15 2017  GLUCAP 228*       Signed:  Cristal Ford  [facility] 08/25/2015, 10:51 AM

## 2015-08-25 NOTE — Care Management Note (Signed)
Case Management Note  Patient Details  Name: Cathy Jones MRN: [id number] Date of Birth: 07-Aug-1960  Subjective/Objective:                  Acute kidney injury with metabolic acidosis  Action/Plan: home health RN for IV fluids  Expected Discharge Date:    08/25/15              Expected Discharge Plan:  [place]  In-House Referral:  NA  Discharge planning Services  CM Consult  Post Acute Care Choice:  NA Choice offered to:  Patient  DME Arranged:    DME Agency:  NA  HH Arranged:  RN [place] Agency:  [facility]  Status of Service:  Completed, signed off  Medicare Important Message Given:    Date Medicare IM Given:    Medicare IM give by:    Date Additional Medicare IM Given:    Additional Medicare Important Message give by:     If discussed at [place] of Stay Meetings, dates discussed:    Additional Comments: consult for PICC line care and IV fluid administration.  Spoke to patient and husband, patient chose [facility].  [facility] rep [place] notified via phone, orders for PICC line care and flushing in computer orders for MD to initiate orders for IV fluid administration upon office visit on Monday also in computer.  Both orders explained to [facility] rep who repeated information and understood, plan.   No other CM needs   Sherlynn Tour[place], Sharene Skeans, RN 08/25/2015, 3:02 PM

## 2015-08-25 NOTE — Progress Notes (Signed)
Nurse reviewed discharge instructions with pt.  Pt verbalized understanding of discharge instructions, follow up appointments and new medications.  No concerns at time of discharge.  Pt discharged to home with PICC line.

## 2015-08-27 ENCOUNTER — Ambulatory Visit (HOSPITAL_COMMUNITY): Payer: BLUE CROSS/BLUE SHIELD

## 2015-08-27 ENCOUNTER — Ambulatory Visit (HOSPITAL_COMMUNITY)
Admission: RE | Admit: 2015-08-27 | Discharge: 2015-08-27 | Disposition: A | Discharge status: Home / Self Care | Payer: BLUE CROSS/BLUE SHIELD | Source: Ambulatory Visit | Attending: Vascular Surgery | Admitting: Vascular Surgery

## 2015-08-27 DIAGNOSIS — R0989 Other specified symptoms and signs involving the circulatory and respiratory systems: Secondary | ICD-10-CM | POA: Diagnosis present

## 2015-08-27 DIAGNOSIS — I10 Essential (primary) hypertension: Secondary | ICD-10-CM | POA: Insufficient documentation

## 2015-08-27 DIAGNOSIS — K219 Gastro-esophageal reflux disease without esophagitis: Secondary | ICD-10-CM | POA: Diagnosis not present

## 2015-08-27 DIAGNOSIS — E785 Hyperlipidemia, unspecified: Secondary | ICD-10-CM | POA: Diagnosis not present

## 2015-08-27 DIAGNOSIS — I739 Peripheral vascular disease, unspecified: Secondary | ICD-10-CM

## 2015-08-28 ENCOUNTER — Telehealth: Payer: Self-pay

## 2015-08-28 ENCOUNTER — Ambulatory Visit (INDEPENDENT_AMBULATORY_CARE_PROVIDER_SITE_OTHER): Payer: BLUE CROSS/BLUE SHIELD | Admitting: Vascular Surgery

## 2015-08-28 ENCOUNTER — Encounter: Payer: Self-pay | Admitting: Vascular Surgery

## 2015-08-28 VITALS — BP 98/55 | HR 66 | Temp 98.7°F | Resp 18 | Ht 62.0 in | Wt 191.0 lb

## 2015-08-28 DIAGNOSIS — I739 Peripheral vascular disease, unspecified: Secondary | ICD-10-CM

## 2015-08-28 DIAGNOSIS — I70219 Atherosclerosis of native arteries of extremities with intermittent claudication, unspecified extremity: Secondary | ICD-10-CM

## 2015-08-28 DIAGNOSIS — I7410 Embolism and thrombosis of unspecified parts of aorta: Secondary | ICD-10-CM

## 2015-08-28 LAB — CULTURE, BLOOD (ROUTINE X 2)
Culture: NO GROWTH
Culture: NO GROWTH

## 2015-08-28 NOTE — Progress Notes (Signed)
Patient name: Cathy Jones MRN: [id number] DOB: Jun 17, 1960 Sex: female  REASON FOR VISIT: Follow-up of left to right femorofemoral bypass on 08/10/2015  HPI: Cathy Jones is a 55 y.o. female in today for follow-up. She is very complex history. She initially presented with severe renal insufficiency following colon resection and ileostomy. She became very dehydrated. CT of her abdomen revealed scattered splenic infarcts and a supra celiac area of probable thrombus in her aorta. She then presented approximately 2 weeks later with right leg ischemia which was progressive. She underwent urgent left to right femorofemoral bypass after attempting to thrombectomize her right iliac artery. She had chronic thrombus present. She did well following this procedure with slow continued improvement of the numbness that she had preoperatively. Unfortunate she was readmitted to the hospital following that discharge related again to severe dehydration related to her ileostomy. Her creatinine rose again to 3 but is now back down to 0.9. He reports no claudication symptoms.  Current Outpatient Prescriptions  Medication Sig Dispense Refill  . apixaban (ELIQUIS) 5 MG TABS tablet Take 1 tablet (5 mg total) by mouth 2 (two) times daily. 60 tablet 0  . atorvastatin (LIPITOR) 10 MG tablet Take 10 mg by mouth every morning.    . cholestyramine (QUESTRAN) 4 g packet Take 1 packet (4 g total) by mouth daily. 30 each 0  . diphenoxylate-atropine (LOMOTIL) 2.5-0.025 MG tablet Take 2 tablets by mouth 4 (four) times daily -  before meals and at bedtime. 60 tablet 3  . ferrous sulfate 325 (65 FE) MG tablet Take 1 tablet (325 mg total) by mouth 2 (two) times daily with a meal. 60 tablet 1  . folic acid (FOLVITE) 1 MG tablet Take 1 tablet (1 mg total) by mouth daily. 30 tablet 0  . loperamide (IMODIUM) 2 MG capsule Take 2 capsules (4 mg total) by mouth 4 (four) times daily -  before meals and at bedtime. 100 capsule 5  .  LORazepam (ATIVAN) 0.5 MG tablet Take 0.5 mg by mouth every morning.    . ondansetron (ZOFRAN) 4 MG tablet Take 1 tablet (4 mg total) by mouth every 6 (six) hours as needed for nausea. 30 tablet 0  . oxyCODONE (OXY IR/ROXICODONE) 5 MG immediate release tablet Take 1 tablet (5 mg total) by mouth every 4 (four) hours as needed for moderate pain. 30 tablet 0  . pantoprazole (PROTONIX) 40 MG tablet Take 1 tablet (40 mg total) by mouth at bedtime. 30 tablet 0  . polycarbophil (FIBERCON) 625 MG tablet Take 1 tablet (625 mg total) by mouth 2 (two) times daily. 60 tablet 2  . PROAIR HFA 108 (90 BASE) MCG/ACT inhaler Inhale 1-2 puffs into the lungs every 4 (four) hours as needed for wheezing or shortness of breath.     . sertraline (ZOLOFT) 100 MG tablet Take one tablet by mouth every morning.  0  . vitamin B-12 1000 MCG tablet Take 0.5 tablets (500 mcg total) by mouth daily.     No current facility-administered medications for this visit.   PHYSICAL EXAM: Filed Vitals:   08/28/15 1111  BP: 98/55  Pulse: 66  Temp: 98.7 F (37.1 C)  Resp: 18  Height: 5\' 2"  (1.575 m)  Weight: 191 lb (86.637 kg)  SpO2: 99%    GENERAL: The patient is a well-nourished female, in no acute distress. The vital signs are documented above. Her femoral incisions are healed quite nicely. She does have a palpable femorofemoral graft pulse  and audible flow in this as well. He is unable to undergo ankle arm index due to none occlusive disease in the left and a PICC line on the right arm. She has a triphasic dorsalis pedis flow on the left and monophasic on the right.  MEDICAL ISSUES: Continued recovery from her urgent left right femorofemoral bypass for severe ischemia in her right arm. She will continue her walking program. I have recommended a follow-up CT scan of her aorta and with the abdomen and pelvis. Would defer this until we're sure her renal function has stabilized. We'll plan to see her in 6 weeks with CT as long as  she has not had any progression of her renal insufficiency  Akaila Rambo [facility] [employer]: [phone]

## 2015-08-28 NOTE — Telephone Encounter (Signed)
rec'd phone call from pt's. [place] RN.  Reported fever of 100.3. and 100.0 degrees, when checked this afternoon.  Stated pt. Denied any urinary symptoms.  Reported clear lung sounds.  Stated she is unable to determine the source.  Discussed with Dr. Donnetta Hutching;  reported the pt's incision looked okay today @ her f/u visit.  Advised to continue to monitor, and report any worsening of symptoms.  Notified pt's Jessie RN of Dr. Luther Parody recommendation.  Verb. Understanding.

## 2015-08-29 NOTE — Addendum Note (Signed)
Addended by: Mena Goes on: 08/29/2015 12:55 PM   Modules accepted: Orders

## 2015-09-01 ENCOUNTER — Telehealth: Payer: Self-pay | Admitting: General Surgery

## 2015-09-01 NOTE — Telephone Encounter (Signed)
Called to check on pt.  Has had about a 50% drop in ostomy output with the tincture of opium but also had one episode of vomiting.  She is going to give herself another liter of fluids today.  She will call us if her symptoms get worse.

## 2015-09-02 ENCOUNTER — Inpatient Hospital Stay (HOSPITAL_COMMUNITY)
Admission: AD | Admit: 2015-09-02 | Discharge: 2015-09-08 | DRG: 982 | Disposition: A | Discharge status: Home Health | Payer: BLUE CROSS/BLUE SHIELD | Source: Ambulatory Visit | Attending: General Surgery | Admitting: General Surgery

## 2015-09-02 ENCOUNTER — Encounter (HOSPITAL_COMMUNITY): Payer: Self-pay

## 2015-09-02 DIAGNOSIS — Z7901 Long term (current) use of anticoagulants: Secondary | ICD-10-CM | POA: Diagnosis not present

## 2015-09-02 DIAGNOSIS — E785 Hyperlipidemia, unspecified: Secondary | ICD-10-CM | POA: Diagnosis present

## 2015-09-02 DIAGNOSIS — Z6838 Body mass index (BMI) 38.0-38.9, adult: Secondary | ICD-10-CM

## 2015-09-02 DIAGNOSIS — R110 Nausea: Secondary | ICD-10-CM | POA: Diagnosis present

## 2015-09-02 DIAGNOSIS — T82868A Thrombosis of vascular prosthetic devices, implants and grafts, initial encounter: Secondary | ICD-10-CM | POA: Diagnosis not present

## 2015-09-02 DIAGNOSIS — K611 Rectal abscess: Secondary | ICD-10-CM | POA: Diagnosis present

## 2015-09-02 DIAGNOSIS — Z87891 Personal history of nicotine dependence: Secondary | ICD-10-CM | POA: Diagnosis not present

## 2015-09-02 DIAGNOSIS — F419 Anxiety disorder, unspecified: Secondary | ICD-10-CM | POA: Diagnosis present

## 2015-09-02 DIAGNOSIS — Z886 Allergy status to analgesic agent status: Secondary | ICD-10-CM

## 2015-09-02 DIAGNOSIS — Z7951 Long term (current) use of inhaled steroids: Secondary | ICD-10-CM | POA: Diagnosis not present

## 2015-09-02 DIAGNOSIS — E782 Mixed hyperlipidemia: Secondary | ICD-10-CM | POA: Diagnosis present

## 2015-09-02 DIAGNOSIS — F32A Depression, unspecified: Secondary | ICD-10-CM | POA: Diagnosis present

## 2015-09-02 DIAGNOSIS — K219 Gastro-esophageal reflux disease without esophagitis: Secondary | ICD-10-CM | POA: Diagnosis present

## 2015-09-02 DIAGNOSIS — Z90710 Acquired absence of both cervix and uterus: Secondary | ICD-10-CM

## 2015-09-02 DIAGNOSIS — Z79899 Other long term (current) drug therapy: Secondary | ICD-10-CM

## 2015-09-02 DIAGNOSIS — D529 Folate deficiency anemia, unspecified: Secondary | ICD-10-CM | POA: Diagnosis present

## 2015-09-02 DIAGNOSIS — Z79891 Long term (current) use of opiate analgesic: Secondary | ICD-10-CM | POA: Diagnosis not present

## 2015-09-02 DIAGNOSIS — I739 Peripheral vascular disease, unspecified: Secondary | ICD-10-CM | POA: Diagnosis present

## 2015-09-02 DIAGNOSIS — R198 Other specified symptoms and signs involving the digestive system and abdomen: Secondary | ICD-10-CM

## 2015-09-02 DIAGNOSIS — Z8249 Family history of ischemic heart disease and other diseases of the circulatory system: Secondary | ICD-10-CM

## 2015-09-02 DIAGNOSIS — I10 Essential (primary) hypertension: Secondary | ICD-10-CM | POA: Diagnosis present

## 2015-09-02 DIAGNOSIS — M21371 Foot drop, right foot: Secondary | ICD-10-CM | POA: Diagnosis not present

## 2015-09-02 DIAGNOSIS — N739 Female pelvic inflammatory disease, unspecified: Secondary | ICD-10-CM

## 2015-09-02 DIAGNOSIS — F329 Major depressive disorder, single episode, unspecified: Secondary | ICD-10-CM | POA: Diagnosis present

## 2015-09-02 DIAGNOSIS — K9189 Other postprocedural complications and disorders of digestive system: Secondary | ICD-10-CM | POA: Diagnosis present

## 2015-09-02 DIAGNOSIS — K913 Postprocedural intestinal obstruction, unspecified as to partial versus complete: Secondary | ICD-10-CM

## 2015-09-02 DIAGNOSIS — J45909 Unspecified asthma, uncomplicated: Secondary | ICD-10-CM | POA: Diagnosis present

## 2015-09-02 DIAGNOSIS — Z8541 Personal history of malignant neoplasm of cervix uteri: Secondary | ICD-10-CM

## 2015-09-02 DIAGNOSIS — Z932 Ileostomy status: Secondary | ICD-10-CM

## 2015-09-02 DIAGNOSIS — D72829 Elevated white blood cell count, unspecified: Secondary | ICD-10-CM

## 2015-09-02 DIAGNOSIS — E440 Moderate protein-calorie malnutrition: Secondary | ICD-10-CM | POA: Insufficient documentation

## 2015-09-02 DIAGNOSIS — Y832 Surgical operation with anastomosis, bypass or graft as the cause of abnormal reaction of the patient, or of later complication, without mention of misadventure at the time of the procedure: Secondary | ICD-10-CM | POA: Diagnosis present

## 2015-09-02 DIAGNOSIS — Z419 Encounter for procedure for purposes other than remedying health state, unspecified: Secondary | ICD-10-CM

## 2015-09-02 DIAGNOSIS — E860 Dehydration: Secondary | ICD-10-CM | POA: Diagnosis present

## 2015-09-02 DIAGNOSIS — K624 Stenosis of anus and rectum: Secondary | ICD-10-CM | POA: Diagnosis present

## 2015-09-02 DIAGNOSIS — N179 Acute kidney failure, unspecified: Secondary | ICD-10-CM | POA: Diagnosis present

## 2015-09-02 DIAGNOSIS — I7090 Unspecified atherosclerosis: Secondary | ICD-10-CM | POA: Diagnosis present

## 2015-09-02 LAB — BASIC METABOLIC PANEL
ANION GAP: 12 (ref 5–15)
BUN: 18 mg/dL (ref 6–20)
CALCIUM: 9.3 mg/dL (ref 8.9–10.3)
CO2: 24 mmol/L (ref 22–32)
CREATININE: 1.17 mg/dL — AB (ref 0.44–1.00)
Chloride: 93 mmol/L — ABNORMAL LOW (ref 101–111)
GFR, EST NON AFRICAN AMERICAN: 52 mL/min — AB (ref >60)
Glucose, Bld: 118 mg/dL — ABNORMAL HIGH (ref 65–99)
Potassium: 3.9 mmol/L (ref 3.5–5.1)
SODIUM: 129 mmol/L — AB (ref 135–145)

## 2015-09-02 LAB — CBC
HCT: 35 % — ABNORMAL LOW (ref 36.0–46.0)
HEMOGLOBIN: 11.5 g/dL — AB (ref 12.0–15.0)
MCH: 25.7 pg — ABNORMAL LOW (ref 26.0–34.0)
MCHC: 32.9 g/dL (ref 30.0–36.0)
MCV: 78.3 fL (ref 78.0–100.0)
PLATELETS: 372 10*3/uL (ref 150–400)
RBC: 4.47 MIL/uL (ref 3.87–5.11)
RDW: 15.5 % (ref 11.5–15.5)
WBC: 15.7 10*3/uL — AB (ref 4.0–10.5)

## 2015-09-02 MED ORDER — SODIUM CHLORIDE 0.9% FLUSH
10.0000 mL | INTRAVENOUS | Status: DC | PRN
  Administered 2015-09-08 (×2): 10 mL
  Filled 2015-09-02 (×2): qty 40

## 2015-09-02 MED ORDER — CALCIUM POLYCARBOPHIL 625 MG PO TABS
625.0000 mg | ORAL_TABLET | Freq: Two times a day (BID) | ORAL | Status: DC
  Administered 2015-09-02 – 2015-09-07 (×8): 625 mg via ORAL
  Filled 2015-09-02 (×11): qty 1

## 2015-09-02 MED ORDER — VITAMIN B-12 100 MCG PO TABS
100.0000 ug | ORAL_TABLET | Freq: Every day | ORAL | Status: DC
  Administered 2015-09-02 – 2015-09-08 (×5): 100 ug via ORAL
  Filled 2015-09-02 (×7): qty 1

## 2015-09-02 MED ORDER — OXYCODONE HCL 5 MG PO TABS
5.0000 mg | ORAL_TABLET | ORAL | Status: DC | PRN
  Administered 2015-09-03 – 2015-09-06 (×5): 5 mg via ORAL
  Filled 2015-09-02 (×5): qty 1

## 2015-09-02 MED ORDER — OPIUM 10 MG/ML (1%) PO TINC: 1.0000 mL | Freq: Four times a day (QID) | ORAL | Status: DC | PRN

## 2015-09-02 MED ORDER — ATORVASTATIN CALCIUM 10 MG PO TABS
10.0000 mg | ORAL_TABLET | Freq: Every morning | ORAL | Status: DC
  Administered 2015-09-02: 10 mg via ORAL
  Filled 2015-09-02 (×6): qty 1

## 2015-09-02 MED ORDER — SERTRALINE HCL 100 MG PO TABS
100.0000 mg | ORAL_TABLET | Freq: Every morning | ORAL | Status: DC
  Administered 2015-09-03 – 2015-09-08 (×4): 100 mg via ORAL
  Filled 2015-09-02 (×7): qty 1

## 2015-09-02 MED ORDER — APIXABAN 5 MG PO TABS
5.0000 mg | ORAL_TABLET | Freq: Two times a day (BID) | ORAL | Status: DC
  Administered 2015-09-02: 5 mg via ORAL
  Filled 2015-09-02 (×5): qty 1

## 2015-09-02 MED ORDER — ONDANSETRON HCL 4 MG/2ML IJ SOLN
4.0000 mg | Freq: Four times a day (QID) | INTRAMUSCULAR | Status: DC | PRN
  Administered 2015-09-05 – 2015-09-06 (×2): 4 mg via INTRAVENOUS
  Filled 2015-09-02 (×2): qty 2

## 2015-09-02 MED ORDER — ALBUTEROL SULFATE (2.5 MG/3ML) 0.083% IN NEBU: 3.0000 mL | INHALATION_SOLUTION | RESPIRATORY_TRACT | Status: DC | PRN

## 2015-09-02 MED ORDER — PANTOPRAZOLE SODIUM 40 MG PO TBEC
40.0000 mg | DELAYED_RELEASE_TABLET | Freq: Every day | ORAL | Status: DC
  Administered 2015-09-02 – 2015-09-07 (×5): 40 mg via ORAL
  Filled 2015-09-02 (×6): qty 1

## 2015-09-02 MED ORDER — ONDANSETRON 4 MG PO TBDP
4.0000 mg | ORAL_TABLET | Freq: Four times a day (QID) | ORAL | Status: DC | PRN
  Filled 2015-09-02: qty 1

## 2015-09-02 MED ORDER — SODIUM CHLORIDE 0.9 % IV SOLN
INTRAVENOUS | Status: DC
  Administered 2015-09-02 – 2015-09-03 (×5): via INTRAVENOUS
  Administered 2015-09-03: 200 mL/h via INTRAVENOUS
  Administered 2015-09-04 – 2015-09-07 (×5): via INTRAVENOUS

## 2015-09-02 MED ORDER — LOPERAMIDE HCL 2 MG PO CAPS
4.0000 mg | ORAL_CAPSULE | Freq: Three times a day (TID) | ORAL | Status: DC
  Administered 2015-09-02 – 2015-09-08 (×19): 4 mg via ORAL
  Filled 2015-09-02 (×26): qty 2

## 2015-09-02 MED ORDER — FOLIC ACID 1 MG PO TABS
1.0000 mg | ORAL_TABLET | Freq: Every day | ORAL | Status: DC
  Administered 2015-09-02 – 2015-09-08 (×5): 1 mg via ORAL
  Filled 2015-09-02 (×7): qty 1

## 2015-09-02 MED ORDER — DIPHENOXYLATE-ATROPINE 2.5-0.025 MG PO TABS
2.0000 | ORAL_TABLET | Freq: Three times a day (TID) | ORAL | Status: DC
  Administered 2015-09-02 – 2015-09-08 (×20): 2 via ORAL
  Filled 2015-09-02 (×21): qty 2

## 2015-09-02 MED ORDER — LORAZEPAM 0.5 MG PO TABS
0.5000 mg | ORAL_TABLET | Freq: Every morning | ORAL | Status: DC
  Administered 2015-09-03 – 2015-09-08 (×5): 0.5 mg via ORAL
  Filled 2015-09-02 (×6): qty 1

## 2015-09-02 MED ORDER — DIPHENHYDRAMINE HCL 50 MG/ML IJ SOLN: 12.5000 mg | Freq: Four times a day (QID) | INTRAMUSCULAR | Status: DC | PRN

## 2015-09-02 MED ORDER — DIPHENHYDRAMINE HCL 12.5 MG/5ML PO ELIX: 12.5000 mg | ORAL_SOLUTION | Freq: Four times a day (QID) | ORAL | Status: DC | PRN

## 2015-09-02 MED ORDER — CHOLESTYRAMINE 4 G PO PACK
4.0000 g | PACK | Freq: Every day | ORAL | Status: DC
  Administered 2015-09-02 – 2015-09-07 (×4): 4 g via ORAL
  Filled 2015-09-02 (×6): qty 1

## 2015-09-02 MED ORDER — HEPARIN SODIUM (PORCINE) 5000 UNIT/ML IJ SOLN: 5000.0000 [IU] | Freq: Three times a day (TID) | INTRAMUSCULAR | Status: DC

## 2015-09-02 NOTE — H&P (Signed)
Cathy Jones is an 56 y.o. female.   Chief Complaint: nausea HPI: Pt well known to me.  Having high ileostomy output.  Unable to keep up with losses at home despite 3 L of saline infused at home over the last 48h.  She is averaging 2-4 L ileostomy output a day despite maximal medical management to slow her output.  Past Medical History  Diagnosis Date  . Hypertension   . Hyperlipidemia   . Asthma     pt brought her proair inhaler 06-06-14  . Colon polyp     hyperplastic  . Colon stricture ([place])   . Colonic obstruction ([place])   . Depression   . History of hiatal hernia   . Family history of adverse reaction to anesthesia     pts mother experiences nausea and vomiting   . Shortness of breath dyspnea     seasonal   . Anemia   . GERD (gastroesophageal reflux disease)   . Migraine     "q couple years maybe" (08/07/2015)  . Anxiety   . Acute renal failure (ARF) ([place]) 08/07/2015  . Cervical cancer ([place])   . 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 Colonoscopy    . Upper gastrointestinal endoscopy    . Tubal ligation    . Colon resection N/A 06/27/2015    Procedure: LYSIS OF ADHESIONS LOW ANTERIOR RESECTION DIVERTING LOOP ILEOSTOMY;  Surgeon: Leighton Ruff, MD;  Location: [facility];  Service: General;  Laterality: N/A;  . Diverting ileostomy N/A 06/27/2015    Procedure: DIVERTING LOOP ILEOSTOMY;  Surgeon: Leighton Ruff, MD;  Location: [facility];  Service: General;  Laterality: N/A;  . Cystoscopy with stent placement Bilateral 06/27/2015    Procedure: CYSTOSCOPY WITH CATHETER  PLACEMENT;  Surgeon: Franchot Gallo, MD;  Location: [facility];  Service: Urology;  Laterality: Bilateral;  . Colon surgery    . Abdominal hysterectomy  1994  . Peripheral vascular catheterization N/A 08/09/2015    Procedure: Abdominal Aortogram w/Lower Extremity;  Surgeon: Conrad Valencia, MD;  Location: [facility];  Service: Cardiovascular;  Laterality:  N/A;  . Peripheral vascular catheterization Left 08/09/2015    Procedure: Peripheral Vascular Intervention;  Surgeon: Conrad Newark, MD;  Location: [facility];  Service: Cardiovascular;  Laterality: Left;  common iliac  . Femoral-femoral bypass graft Bilateral 08/10/2015    Procedure: BYPASS GRAFT LEFT FEMORALTO RIGHT FEMORAL ARTERY;  Surgeon: Rosetta Posner, MD;  Location: [facility];  Service: Vascular;  Laterality: Bilateral;  . Thrombectomy iliac artery Right 08/10/2015    Procedure: THROMBECTOMY Right ILIAC ARTERY;  Surgeon: Rosetta Posner, MD;  Location: [facility];  Service: Vascular;  Laterality: Right;  . Intraoperative arteriogram Right 08/10/2015    Procedure: INTRA OPERATIVE ARTERIOGRAM Right Iliac Artery;  Surgeon: Rosetta Posner, MD;  Location: [facility];  Service: Vascular;  Laterality: Right;    Family History  Problem Relation Age of Onset  . Colon cancer Neg Hx   . Stomach cancer Neg Hx   . Esophageal cancer Neg Hx   . Deep vein thrombosis Neg Hx   . Rectal cancer Cousin   . Hypertension Mother   . Hyperparathyroidism Mother   . Hypertension Father   . CAD Father     CABG  . Peripheral vascular disease Father   . Graves' disease Father    Social History:  reports that she quit smoking about 3 months  ago. Her smoking use included Cigarettes. She has a 40 pack-year smoking history. She has never used smokeless tobacco. She reports that she does not drink alcohol or use illicit drugs.  Allergies:  Allergies  Allergen Reactions  . Codeine Other (See Comments)    "hyper"    Medications Prior to Admission  Medication Sig Dispense Refill  . apixaban (ELIQUIS) 5 MG TABS tablet Take 1 tablet (5 mg total) by mouth 2 (two) times daily. 60 tablet 0  . atorvastatin (LIPITOR) 10 MG tablet Take 10 mg by mouth every morning.    . cholestyramine (QUESTRAN) 4 g packet Take 1 packet (4 g total) by mouth daily. 30 each 0  . diphenoxylate-atropine (LOMOTIL) 2.5-0.025 MG tablet Take 2 tablets by  mouth 4 (four) times daily -  before meals and at bedtime. 60 tablet 3  . ferrous sulfate 325 (65 FE) MG tablet Take 1 tablet (325 mg total) by mouth 2 (two) times daily with a meal. 60 tablet 1  . folic acid (FOLVITE) 1 MG tablet Take 1 tablet (1 mg total) by mouth daily. 30 tablet 0  . loperamide (IMODIUM) 2 MG capsule Take 2 capsules (4 mg total) by mouth 4 (four) times daily -  before meals and at bedtime. 100 capsule 5  . LORazepam (ATIVAN) 0.5 MG tablet Take 0.5 mg by mouth every morning.    . ondansetron (ZOFRAN) 4 MG tablet Take 1 tablet (4 mg total) by mouth every 6 (six) hours as needed for nausea. 30 tablet 0  . oxyCODONE (OXY IR/ROXICODONE) 5 MG immediate release tablet Take 1 tablet (5 mg total) by mouth every 4 (four) hours as needed for moderate pain. 30 tablet 0  . pantoprazole (PROTONIX) 40 MG tablet Take 1 tablet (40 mg total) by mouth at bedtime. 30 tablet 0  . polycarbophil (FIBERCON) 625 MG tablet Take 1 tablet (625 mg total) by mouth 2 (two) times daily. 60 tablet 2  . PROAIR HFA 108 (90 BASE) MCG/ACT inhaler Inhale 1-2 puffs into the lungs every 4 (four) hours as needed for wheezing or shortness of breath.     . sertraline (ZOLOFT) 100 MG tablet Take one tablet by mouth every morning.  0  . vitamin B-12 1000 MCG tablet Take 0.5 tablets (500 mcg total) by mouth daily.      No results found for this or any previous visit (from the past 48 hour(s)). No results found.  Review of Systems  Constitutional: Positive for malaise/fatigue. Negative for fever and chills.  Eyes: Negative for blurred vision.  Gastrointestinal: Positive for nausea, vomiting and diarrhea. Negative for abdominal pain.  Genitourinary: Negative for dysuria, urgency, frequency and flank pain.  Skin: Negative for rash.  Neurological: Negative for headaches.    There were no vitals taken for this visit. Physical Exam  Constitutional: She is oriented to person, place, and time. She appears well-developed  and well-nourished.  HENT:  Head: Normocephalic and atraumatic.  Eyes: Conjunctivae and EOM are normal. Pupils are equal, round, and reactive to light.  Neck: Normal range of motion. Neck supple.  Cardiovascular: Normal rate and regular rhythm.   Respiratory: Effort normal and breath sounds normal. No respiratory distress.  GI: Soft. Bowel sounds are normal. She exhibits no distension. There is no tenderness.  Musculoskeletal: Normal range of motion.  Neurological: She is alert and oriented to person, place, and time.  Skin: Skin is warm and dry.     Assessment/Plan IV fluid rehydration.  Check BMP.  Cont anti-motility agents.  Will get BE in AM to eval her anastomosis.  Maybe we can reverse her ostomy soon.  Cont anticoagulation.  Rosario Adie., MD [id number], 11:42 AM

## 2015-09-03 ENCOUNTER — Inpatient Hospital Stay (HOSPITAL_COMMUNITY): Payer: BLUE CROSS/BLUE SHIELD

## 2015-09-03 ENCOUNTER — Encounter (HOSPITAL_COMMUNITY): Payer: Self-pay | Admitting: Radiology

## 2015-09-03 LAB — BASIC METABOLIC PANEL
Anion gap: 11 (ref 5–15)
BUN: 15 mg/dL (ref 6–20)
CHLORIDE: 102 mmol/L (ref 101–111)
CO2: 21 mmol/L — ABNORMAL LOW (ref 22–32)
Calcium: 9.1 mg/dL (ref 8.9–10.3)
Creatinine, Ser: 0.94 mg/dL (ref 0.44–1.00)
GFR calc non Af Amer: >60 mL/min (ref >60)
Glucose, Bld: 120 mg/dL — ABNORMAL HIGH (ref 65–99)
POTASSIUM: 4.1 mmol/L (ref 3.5–5.1)
SODIUM: 134 mmol/L — AB (ref 135–145)

## 2015-09-03 LAB — CBC
HEMATOCRIT: 32.3 % — AB (ref 36.0–46.0)
Hemoglobin: 10.4 g/dL — ABNORMAL LOW (ref 12.0–15.0)
MCH: 25.9 pg — ABNORMAL LOW (ref 26.0–34.0)
MCHC: 32.2 g/dL (ref 30.0–36.0)
MCV: 80.3 fL (ref 78.0–100.0)
Platelets: 392 10*3/uL (ref 150–400)
RBC: 4.02 MIL/uL (ref 3.87–5.11)
RDW: 15.9 % — ABNORMAL HIGH (ref 11.5–15.5)
WBC: 17.1 10*3/uL — AB (ref 4.0–10.5)

## 2015-09-03 MED ORDER — DIATRIZOATE MEGLUMINE & SODIUM 66-10 % PO SOLN
15.0000 mL | ORAL | Status: AC
  Administered 2015-09-03 (×2): 15 mL via ORAL

## 2015-09-03 MED ORDER — IOPAMIDOL (ISOVUE-300) INJECTION 61%
100.0000 mL | Freq: Once | INTRAVENOUS | Status: AC | PRN
  Administered 2015-09-03: 100 mL via INTRAVENOUS

## 2015-09-03 MED ORDER — IOPAMIDOL (ISOVUE-300) INJECTION 61%
150.0000 mL | Freq: Once | INTRAVENOUS | Status: AC | PRN
  Administered 2015-09-03: 900 mL

## 2015-09-03 MED ORDER — SODIUM CHLORIDE 0.9 % IV SOLN
1.0000 g | INTRAVENOUS | Status: DC
  Administered 2015-09-03 – 2015-09-08 (×6): 1 g via INTRAVENOUS
  Filled 2015-09-03 (×7): qty 1

## 2015-09-03 NOTE — Progress Notes (Signed)
Initial Nutrition Assessment  DOCUMENTATION CODES:   Non-severe (moderate) malnutrition in context of chronic illness  INTERVENTION:  -Encouraged intake of energy dense foods and soluble fiber to help decrease diarrhea.  -Continue to monitor for nutritional needs.  NUTRITION DIAGNOSIS:   Inadequate oral intake related to poor appetite as evidenced by per patient/family report.  GOAL:   Patient will meet greater than or equal to 90% of their needs  MONITOR:   PO intake, Labs, Weight trends, Skin, I & O's  REASON FOR ASSESSMENT:   Malnutrition Screening Tool    ASSESSMENT:   Pt well known to me. Having high ileostomy output. Unable to keep up with losses at home despite 3 L of saline infused at home over the last 48h. She is averaging 2-4 L ileostomy output a day despite maximal medical management to slow her output.  Pt seen for MST. Pt BMI categorized as obese. Pt has experienced recent weight loss of 15 lbs since 4/2. This is a 7.6% weight loss which is significant for time frame. Pt reports this weight fluctuation as normal d/t chronic N/V associated with eating.  Pt reports eating BID. Pt eats breakfast which might be half a bowl of oatmeal and dinner. Pt reports vomiting around 2 am every night. Pt reports not being able to keep any food down over the last week. Pt reports poor appetite since 06/27/15. Pt does not take nutritional supplements at home and refuses them here. Pt ate oatmeal and pineapple for breakfast today and had a cheese/fruit plate for dinner last night. Pt denies N/V after eating these. Intern encouraged pt to eat energy dense foods and a low fiber diet. Intern discussed eating foods with soluble fiber to help bulk the stools.   NFPE: Mild muscle depletion, no fat depletion, no edema.  Labs reviewed; Na 134 mmol/L, glucose 120 mg/dl. Meds reviewed; Folic acid 1 mg, [id number] [id number] mcg.   Diet Order:  Diet regular Room service appropriate?: Yes; Fluid  consistency:: Thin  Skin:  Wound (see comment) (Incision on groin)  Last BM:  4/23  Height:   Ht Readings from Last 1 Encounters:  09/02/15 5\' 2"  (1.575 m)    Weight:   Wt Readings from Last 1 Encounters:  09/02/15 180 lb (81.647 kg)    Ideal Body Weight:  50 kg  BMI:  Body mass index is 32.91 kg/(m^2).  Estimated Nutritional Needs:   Kcal:  1450-1650 (20-22 kcal/kg)  Protein:  85-95 (1.1 g/kg)  Fluid:  1.4-1.6 L  EDUCATION NEEDS:   Education needs addressed  Geoffery Lyons, [facility] Dietetic Intern Pager [phone]

## 2015-09-03 NOTE — Progress Notes (Signed)
Dehydration  Subjective: Pt still having high ostomy output.  WBC elevated despite hydration.  Pt feeling better.  Objective: Vital signs in last 24 hours: Temp:  [98.3 F (36.8 C)-98.7 F (37.1 C)] 98.7 F (37.1 C) (04/24 0559) Pulse Rate:  [73-91] 73 (04/24 0559) Resp:  [14-18] 16 (04/24 0559) BP: (86-134)/(44-70) 113/44 mmHg (04/24 0559) SpO2:  [98 %-100 %] 100 % (04/24 0559) Weight:  [81.647 kg (180 lb)] 81.647 kg (180 lb) (04/23 2109) Last BM Date: 09/02/15  Intake/Output from previous day: 04/23 0701 - 04/24 0700 In: 1276.7 [P.O.:960; I.V.:316.7] Out: 4865 [Urine:1990; Stool:2875] Intake/Output this shift:    General appearance: alert and cooperative GI: normal findings: soft, non-tender  Lab Results:  Results for orders placed or performed during the hospital encounter of 09/02/15 (from the past 24 hour(s))  Basic metabolic panel     Status: Abnormal   Collection Time: 09/02/15 12:05 PM  Result Value Ref Range   Sodium 129 (L) 135 - 145 mmol/L   Potassium 3.9 3.5 - 5.1 mmol/L   Chloride 93 (L) 101 - 111 mmol/L   CO2 24 22 - 32 mmol/L   Glucose, Bld 118 (H) 65 - 99 mg/dL   BUN 18 6 - 20 mg/dL   Creatinine, Ser 1.17 (H) 0.44 - 1.00 mg/dL   Calcium 9.3 8.9 - 10.3 mg/dL   GFR calc non Af Amer 52 (L) >60 mL/min   GFR calc Af Amer >60 >60 mL/min   Anion gap 12 5 - 15  CBC     Status: Abnormal   Collection Time: 09/02/15 12:05 PM  Result Value Ref Range   WBC 15.7 (H) 4.0 - 10.5 K/uL   RBC 4.47 3.87 - 5.11 MIL/uL   Hemoglobin 11.5 (L) 12.0 - 15.0 g/dL   HCT 35.0 (L) 36.0 - 46.0 %   MCV 78.3 78.0 - 100.0 fL   MCH 25.7 (L) 26.0 - 34.0 pg   MCHC 32.9 30.0 - 36.0 g/dL   RDW 15.5 11.5 - 15.5 %   Platelets 372 150 - 400 K/uL  Basic metabolic panel     Status: Abnormal   Collection Time: 09/03/15  5:29 AM  Result Value Ref Range   Sodium 134 (L) 135 - 145 mmol/L   Potassium 4.1 3.5 - 5.1 mmol/L   Chloride 102 101 - 111 mmol/L   CO2 21 (L) 22 - 32 mmol/L   Glucose, Bld 120 (H) 65 - 99 mg/dL   BUN 15 6 - 20 mg/dL   Creatinine, Ser 0.94 0.44 - 1.00 mg/dL   Calcium 9.1 8.9 - 10.3 mg/dL   GFR calc non Af Amer >60 >60 mL/min   GFR calc Af Amer >60 >60 mL/min   Anion gap 11 5 - 15  CBC     Status: Abnormal   Collection Time: 09/03/15  5:29 AM  Result Value Ref Range   WBC 17.1 (H) 4.0 - 10.5 K/uL   RBC 4.02 3.87 - 5.11 MIL/uL   Hemoglobin 10.4 (L) 12.0 - 15.0 g/dL   HCT 32.3 (L) 36.0 - 46.0 %   MCV 80.3 78.0 - 100.0 fL   MCH 25.9 (L) 26.0 - 34.0 pg   MCHC 32.2 30.0 - 36.0 g/dL   RDW 15.9 (H) 11.5 - 15.5 %   Platelets 392 150 - 400 K/uL     Studies/Results Radiology     MEDS, Scheduled . apixaban  5 mg Oral BID  . atorvastatin  10 mg Oral q  morning - 10a  . cholestyramine  4 g Oral Daily  . diphenoxylate-atropine  2 tablet Oral TID AC & HS  . ertapenem  1 g Intravenous Q24H  . folic acid  1 mg Oral Daily  . loperamide  4 mg Oral TID AC & HS  . LORazepam  0.5 mg Oral q morning - 10a  . pantoprazole  40 mg Oral QHS  . polycarbophil  625 mg Oral BID  . sertraline  100 mg Oral q morning - 10a  . vitamin B-12  100 mcg Oral Daily     Assessment: Dehydration High output ileostomy  Plan: Barium enema today to eval anastomosis CT scan abdomen and pelvis to eval for pelvic abscess.  Will start empiric antibiotics for 24h until results of CT are noted.   COnt IV fluids at current rate to keep up with ileostomy output.    LOS: 1 day    Rosario Adie, [facility], [place]   09/03/2015 8:51 AM

## 2015-09-03 NOTE — Progress Notes (Signed)
Patient ID: Cathy Jones, female   DOB: 1960/09/05, 55 y.o.   MRN: [id number] Request received for CT guided aspiration/possible drainage of pelvic fluid collection. Imaging studies have been reviewed by Dr. Annamaria Boots and area appears amenable to drainage. Patient currently on Eliquis which needs to be held for 48 hours prior to the above procedure. Nurse notified of above. Will f/u with pt on 4/25.

## 2015-09-04 LAB — HEPARIN LEVEL (UNFRACTIONATED)

## 2015-09-04 LAB — PROTIME-INR
INR: 1.05 (ref 0.00–1.49)
Prothrombin Time: 13.9 seconds (ref 11.6–15.2)

## 2015-09-04 LAB — APTT
APTT: 28 s (ref 24–37)
APTT: 35 s (ref 24–37)

## 2015-09-04 IMAGING — MG MM SCREEN MAMMOGRAM BILATERAL
4 series · 4 of 4 positions shown · non-contrast
Comparison: Previous exam(s)

ACR Breast Density Category a: The breast tissue is almost entirely
fatty.

CLINICAL DATA: Screening.

EXAM:
DIGITAL SCREENING BILATERAL MAMMOGRAM WITH CAD

[R CC]
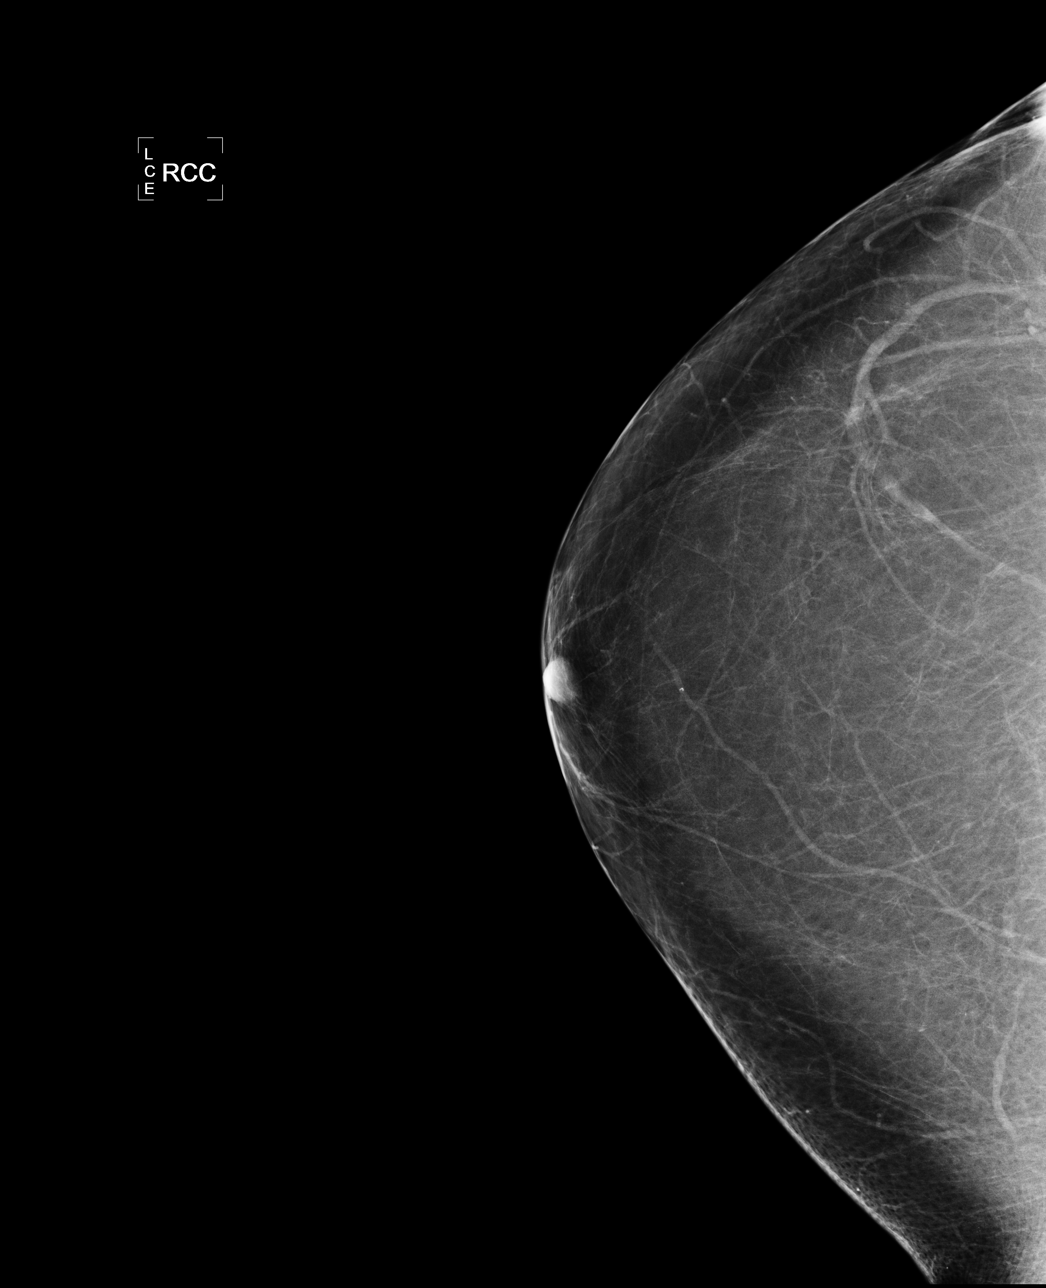

[L CC]
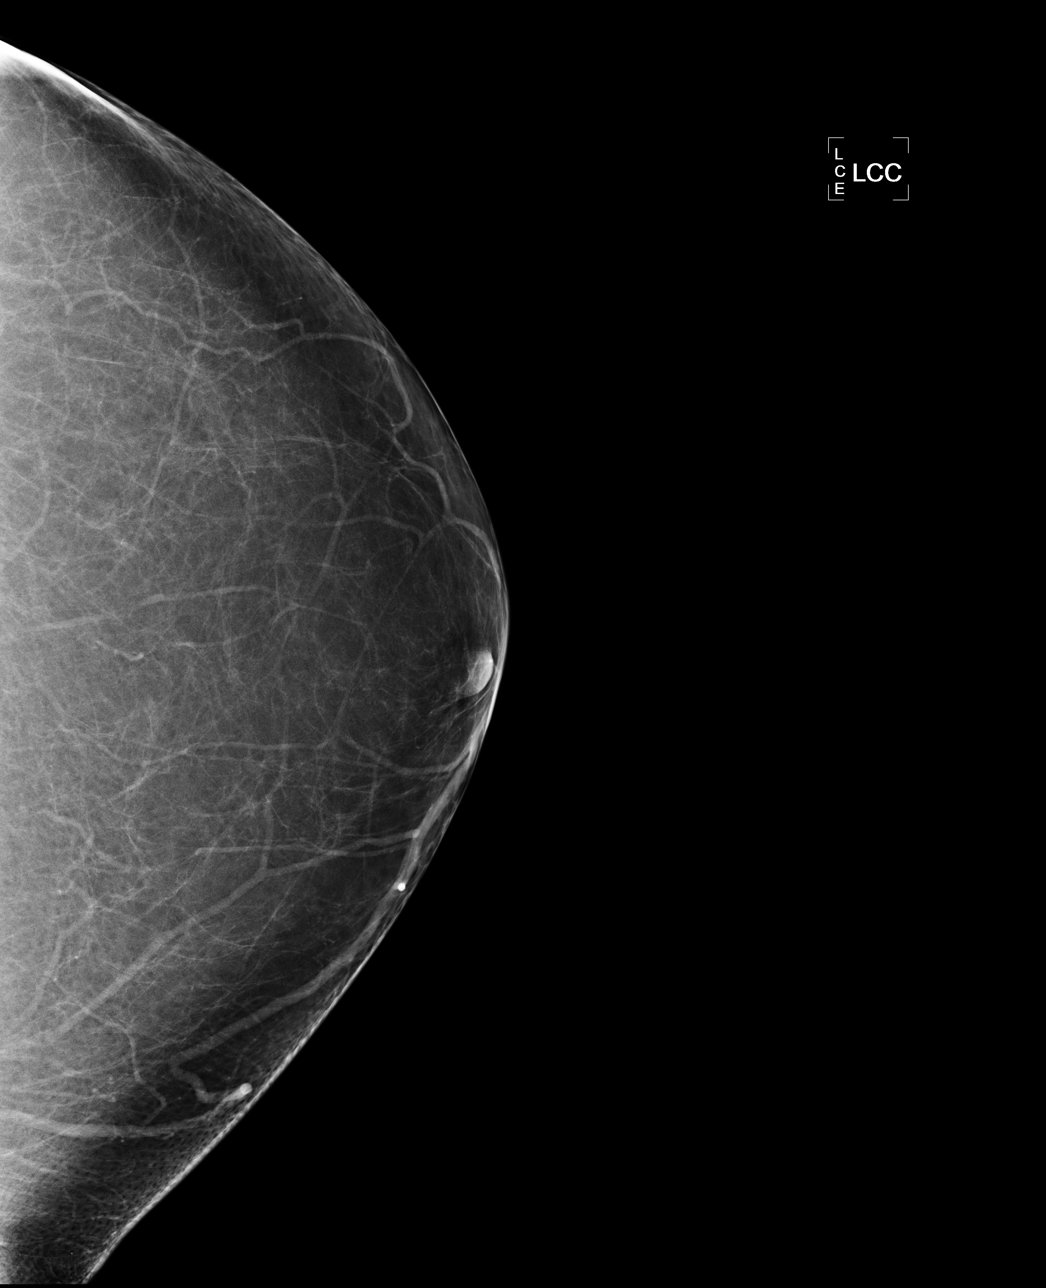

[L MLO]
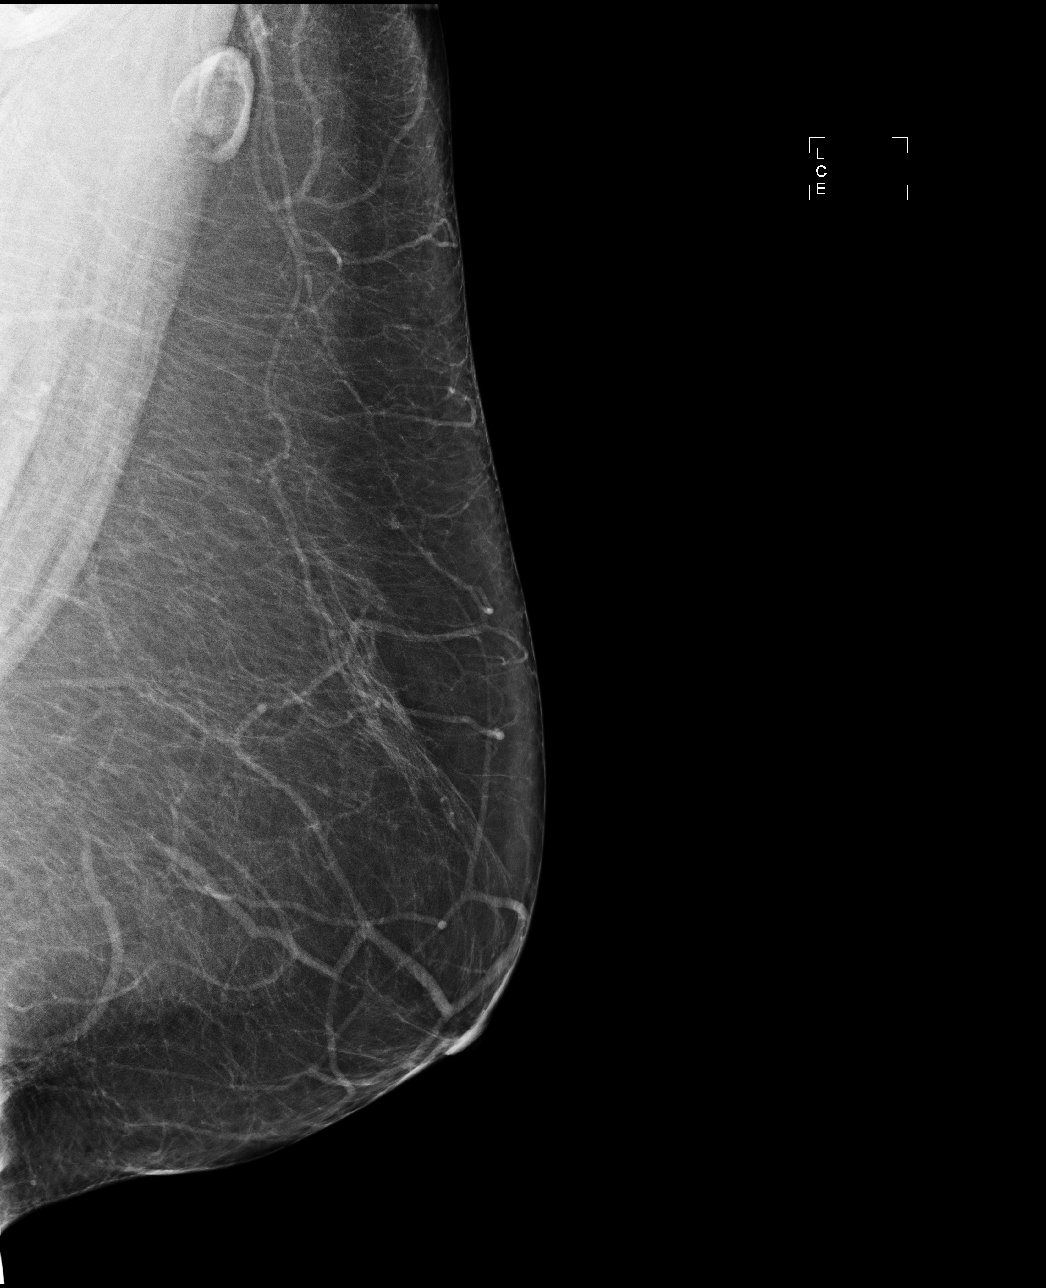

[R MLO]
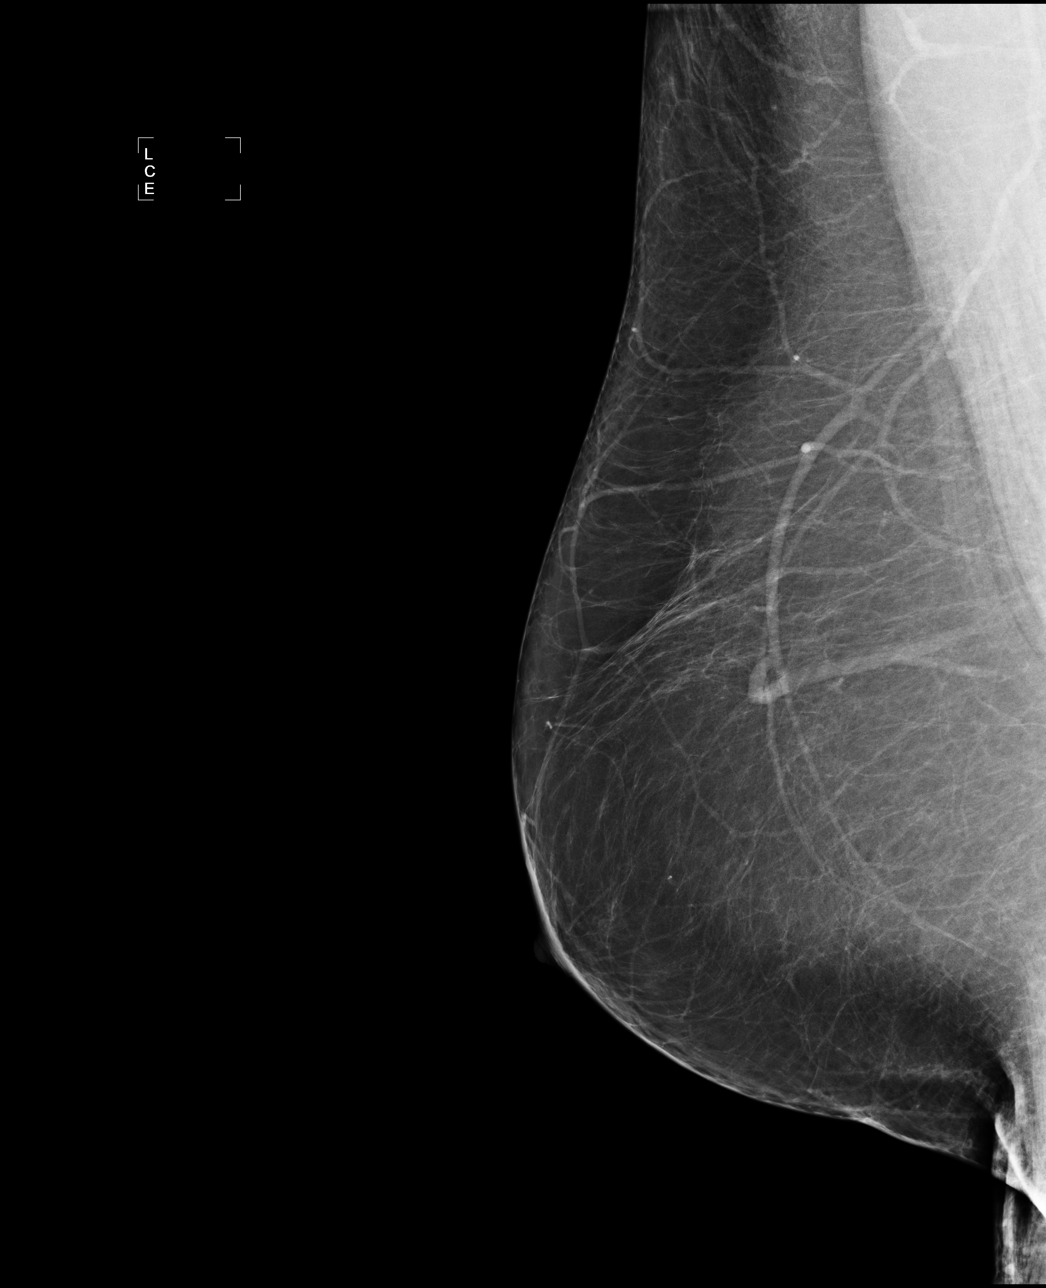

[4 of 4 positions shown; findings below may reference images not displayed]

FINDINGS: There are no findings suspicious for malignancy. Images were
processed with CAD.
IMPRESSION: No mammographic evidence of malignancy. A result letter of this
screening mammogram will be mailed directly to the patient.

RECOMMENDATION:
Screening mammogram in one year. (Code:JV-[id number])

BI-RADS CATEGORY  1: Negative.

## 2015-09-04 MED ORDER — HEPARIN (PORCINE) IN NACL 100-0.45 UNIT/ML-% IJ SOLN
1250.0000 [IU]/h | INTRAMUSCULAR | Status: DC
  Administered 2015-09-04: 1250 [IU]/h via INTRAVENOUS
  Filled 2015-09-04: qty 250

## 2015-09-04 MED ORDER — HEPARIN BOLUS VIA INFUSION
2000.0000 [IU] | Freq: Once | INTRAVENOUS | Status: AC
  Administered 2015-09-04: 2000 [IU] via INTRAVENOUS
  Filled 2015-09-04: qty 2000

## 2015-09-04 MED ORDER — HEPARIN (PORCINE) IN NACL 100-0.45 UNIT/ML-% IJ SOLN
1000.0000 [IU]/h | INTRAMUSCULAR | Status: DC
  Administered 2015-09-04: 1000 [IU]/h via INTRAVENOUS
  Filled 2015-09-04: qty 250

## 2015-09-04 NOTE — Progress Notes (Signed)
Dehydration  Subjective:   Pt feeling better.  Objective: Vital signs in last 24 hours: Temp:  [98 F (36.7 C)-98.4 F (36.9 C)] 98 F (36.7 C) (04/25 0501) Pulse Rate:  [79-92] 92 (04/25 0501) Resp:  [14-15] 15 (04/25 0501) BP: (94-116)/(39-55) 94/55 mmHg (04/25 0501) SpO2:  [97 %-100 %] 97 % (04/25 0501) Last BM Date: 09/03/15  Intake/Output from previous day: 04/24 0701 - 04/25 0700 In: 2640 [P.O.:240; I.V.:2400] Out: 2025 [Urine:1175; Stool:850] Intake/Output this shift:    General appearance: alert and cooperative GI: normal findings: soft, non-tender  Lab Results:  No results found for this or any previous visit (from the past 24 hour(s)).   Studies/Results Radiology   MEDS, Scheduled . apixaban  5 mg Oral BID  . atorvastatin  10 mg Oral q morning - 10a  . cholestyramine  4 g Oral Daily  . diphenoxylate-atropine  2 tablet Oral TID AC & HS  . ertapenem  1 g Intravenous Q24H  . folic acid  1 mg Oral Daily  . loperamide  4 mg Oral TID AC & HS  . LORazepam  0.5 mg Oral q morning - 10a  . pantoprazole  40 mg Oral QHS  . polycarbophil  625 mg Oral BID  . sertraline  100 mg Oral q morning - 10a  . vitamin B-12  100 mcg Oral Daily     Assessment: Dehydration High output ileostomy  Plan: Barium enema show anastomotic leak CT scan abdomen and pelvis shows pelvic abscess.  Cont Invanz.  IR to eval for drainage or aspiration Eliquis held.  If no procedure today, will start hep gtt Cont IV fluids at current rate and will titrate to UOP.  Ostomy output much better over the last 24h   LOS: 2 days    Rosario Adie, MD [facility], [place]   09/04/2015 8:27 AM

## 2015-09-04 NOTE — Progress Notes (Signed)
[facility] for Heparin Indication: aortic thrombosis   Patient Measurements: Height: 5\' 2"  (157.5 cm) Weight: 180 lb (81.647 kg) IBW/kg (Calculated) : 50.1 Heparin Dosing Weight: 68kg  Infusions:  . sodium chloride 200 mL/hr at 09/04/15 1750  . heparin 1,000 Units/hr (09/04/15 1604)    Assessment: 13 yoF admitted on 4/23 with high ileostomy output. Barium enema showed anastomotic leak, CT scan shows abscess with plans for IR drainage or aspiration.  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  Her last Eliquis dose was on 4/23 PM.  Pharmacy is consulted to dose Heparin IV prior to procedure.  Today, 09/04/2015:  Baseline APTT 28  Heparin level < 0.1 is subtherapeutic (level drawn ~ 5 hours after infusion started)  RN confirms heparin at 10 ml/hr with no IV interruptions.  CBC: Hgb 10.4, Plt 392  No bleeding or complications reported  SCr initially elevated at 1.17, but has decreased to 0.94 (4/24)   Goal of Therapy:  Heparin level 0.3-0.7 units/ml Monitor platelets by anticoagulation protocol: Yes   Plan:  Give heparin 2000 units bolus IV x 1 Increase to heparin IV infusion at 1250 units/hr Heparin level 6 hours after rate change Daily heparin level and CBC Continue to monitor H&H and platelets  Gretta Arab PharmD, BCPS Pager [phone] 09/04/2015 9:10 PM

## 2015-09-04 NOTE — Consult Note (Signed)
Chief Complaint: Patient was seen in consultation today for CT-guided aspiration/possible drainage of pelvic fluid collection  Referring Physician(s): Thomas,Alicia  Supervising Physician: Sandi Mariscal  Patient Status: In-pt   History of Present Illness: Cathy Jones is a 55 y.o. female with significant past medical history including hypertension, hyperlipidemia, asthma, anemia and remote cervical cancer with prior TAH/lymph node resection and radiation therapy in 1994. Due to colonic stricture the patient underwent LAR with loop ileostomy/ureteral stent placement in February 2017. Postop she was noted to have splenic infarcts as well as aortic thrombosis with acute kidney injury. In March 2017 she underwent a femorofemoral bypass graft with thrombectomy of the right iliac artery by Dr. Donnetta Hutching. She was recently admitted this month with nausea, vomiting, dehydration and leukocytosis and also noted to have high ostomy output. CT scan of abdomen and pelvis on 09/03/15 revealed a collection of fluid and air in the right posterior anatomic pelvis possibly representing abscess formation. Barium enema on 4/24 revealed an anastomotic leak. Request now made for CT-guided aspiration and possible drainage of the pelvic fluid collection.  Past Medical History  Diagnosis Date  . Hypertension   . Hyperlipidemia   . Asthma     pt brought her proair inhaler 06-06-14  . Colon polyp     hyperplastic  . Colon stricture ([place])   . Colonic obstruction ([place])   . Depression   . History of hiatal hernia   . Family history of adverse reaction to anesthesia     pts mother experiences nausea and vomiting   . Shortness of breath dyspnea     seasonal   . Anemia   . GERD (gastroesophageal reflux disease)   . Migraine     "q couple years maybe" (08/07/2015)  . Anxiety   . Acute renal failure (ARF) ([place]) 08/07/2015  . 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 Cervical cancer [facility])     Past Surgical  History  Procedure Laterality Date  . Colonoscopy    . Upper gastrointestinal endoscopy    . Tubal ligation    . Colon resection N/A 06/27/2015    Procedure: LYSIS OF ADHESIONS LOW ANTERIOR RESECTION DIVERTING LOOP ILEOSTOMY;  Surgeon: Leighton Ruff, MD;  Location: [facility];  Service: General;  Laterality: N/A;  . Diverting ileostomy N/A 06/27/2015    Procedure: DIVERTING LOOP ILEOSTOMY;  Surgeon: Leighton Ruff, MD;  Location: [facility];  Service: General;  Laterality: N/A;  . Cystoscopy with stent placement Bilateral 06/27/2015    Procedure: CYSTOSCOPY WITH CATHETER  PLACEMENT;  Surgeon: Franchot Gallo, MD;  Location: [facility];  Service: Urology;  Laterality: Bilateral;  . Colon surgery    . Abdominal hysterectomy  1994  . Peripheral vascular catheterization N/A 08/09/2015    Procedure: Abdominal Aortogram w/Lower Extremity;  Surgeon: Conrad Hollansburg, MD;  Location: [place] [facility];  Service: Cardiovascular;  Laterality: N/A;  . Peripheral vascular catheterization Left 08/09/2015    Procedure: Peripheral Vascular Intervention;  Surgeon: Conrad , MD;  Location: [place] [facility];  Service: Cardiovascular;  Laterality: Left;  common iliac  . Femoral-femoral bypass graft Bilateral 08/10/2015    Procedure: BYPASS GRAFT LEFT FEMORALTO RIGHT FEMORAL ARTERY;  Surgeon: Rosetta Posner, MD;  Location: [place];  Service: Vascular;  Laterality: Bilateral;  . Thrombectomy iliac artery Right 08/10/2015    Procedure: THROMBECTOMY Right ILIAC ARTERY;  Surgeon: Rosetta Posner, MD;  Location: [place];  Service: Vascular;  Laterality: Right;  .  Intraoperative arteriogram Right 08/10/2015    Procedure: INTRA OPERATIVE ARTERIOGRAM Right Iliac Artery;  Surgeon: Rosetta Posner, MD;  Location: [place];  Service: Vascular;  Laterality: Right;    Allergies: Codeine  Medications: Prior to Admission medications   Medication Sig Start Date End Date Taking? Authorizing Provider  apixaban (ELIQUIS) 5 MG TABS tablet Take 1 tablet (5  mg total) by mouth 2 (two) times daily. 08/08/15  Yes Debbe Odea, MD  cholestyramine (QUESTRAN) 4 g packet Take 1 packet (4 g total) by mouth daily. 08/25/15  Yes Maryann Mikhail, DO  diphenoxylate-atropine (LOMOTIL) 2.5-0.025 MG tablet Take 2 tablets by mouth 4 (four) times daily -  before meals and at bedtime. 08/04/15  Yes Michael Boston, MD  ferrous sulfate 325 (65 FE) MG tablet Take 1 tablet (325 mg total) by mouth 2 (two) times daily with a meal. 08/04/15  Yes Michael Boston, MD  folic acid (FOLVITE) 1 MG tablet Take 1 tablet (1 mg total) by mouth daily. 08/04/15  Yes Debbe Odea, MD  loperamide (IMODIUM) 2 MG capsule Take 2 capsules (4 mg total) by mouth 4 (four) times daily -  before meals and at bedtime. 08/04/15  Yes Michael Boston, MD  LORazepam (ATIVAN) 0.5 MG tablet Take 0.5 mg by mouth every morning.   Yes Historical Provider, MD  ondansetron (ZOFRAN) 4 MG tablet Take 1 tablet (4 mg total) by mouth every 6 (six) hours as needed for nausea. 08/25/15  Yes Maryann Mikhail, DO  Opium 10 MG/ML (1%) TINC Take 1 mL by mouth 4 (four) times daily.   Yes Historical Provider, MD  oxyCODONE (OXY IR/ROXICODONE) 5 MG immediate release tablet Take 1 tablet (5 mg total) by mouth every 4 (four) hours as needed for moderate pain. 08/11/15  Yes Ulyses Amor, PA-C  pantoprazole (PROTONIX) 40 MG tablet Take 1 tablet (40 mg total) by mouth at bedtime. 08/25/15  Yes Maryann Mikhail, DO  polycarbophil (FIBERCON) 625 MG tablet Take 1 tablet (625 mg total) by mouth 2 (two) times daily. 08/04/15  Yes Michael Boston, MD  sertraline (ZOLOFT) 100 MG tablet Take one tablet by mouth every morning. 02/19/15  Yes Historical Provider, MD  vitamin B-12 1000 MCG tablet Take 0.5 tablets (500 mcg total) by mouth daily. 08/04/15  Yes Debbe Odea, MD     Family History  Problem Relation Age of Onset  . Colon cancer Neg Hx   . Stomach cancer Neg Hx   . Esophageal cancer Neg Hx   . Deep vein thrombosis Neg Hx   . Rectal cancer Cousin     . Hypertension Mother   . Hyperparathyroidism Mother   . Hypertension Father   . CAD Father     CABG  . Peripheral vascular disease Father   . Graves' disease Father     Social History   Social History  . Marital Status: Married    Spouse Name: N/A  . Number of Children: 1  . Years of Education: N/A   Social History Main Topics  . Smoking status: Former Smoker -- 1.00 packs/day for 40 years    Types: Cigarettes    Quit date: 05/09/2015  . Smokeless tobacco: Never Used  . Alcohol Use: No  . Drug Use: No  . Sexual Activity: Yes   Other Topics Concern  . None   Social History Narrative      Review of Systems see above; patient currently denies fever, headache, chest pain, dyspnea, cough, abnormal bleeding. She does  have intermittent pelvic and lower back/gluteal discomfort as well as nausea and vomiting early in the week.  Vital Signs: BP 94/55 mmHg  Pulse 92  Temp(Src) 98 F (36.7 C) (Oral)  Resp 15  Ht 5\' 2"  (1.575 m)  Wt 180 lb (81.647 kg)  BMI 32.91 kg/m2  SpO2 97%  Physical Exam patient awake, alert. chest clear to auscultation bilaterally. Heart with regular rate and rhythm. Abdomen obese, intact right lower quadrant ostomy, positive bowel sounds, mildly tender pelvic region; lower extremities with full range of motion and no significant edema.  Mallampati Score:     Imaging: Ct Abdomen Pelvis W Contrast  09/03/2015  CLINICAL DATA:  Mucus tinged stools, height ileostomy output. EXAM: CT ABDOMEN AND PELVIS WITH CONTRAST TECHNIQUE: Multidetector CT imaging of the abdomen and pelvis was performed using the standard protocol following bolus administration of intravenous contrast. CONTRAST:  183mL ISOVUE-300 IOPAMIDOL (ISOVUE-300) INJECTION 61% COMPARISON:  07/30/2015. FINDINGS: Lower chest: Lung bases show a triangular-shaped subpleural 5 mm nodule in the right middle lobe, unchanged from 06/13/2014. Heart size normal. No pericardial or pleural effusion.  Hepatobiliary: Liver measures 20.5 cm. No focal lesion. Gallbladder is unremarkable. No biliary ductal dilatation. Pancreas: Negative. Spleen: Negative. Adrenals/Urinary Tract: Adrenal glands and kidneys are unremarkable. Minimal right ureteral dilatation. Left ureter is decompressed. Bladder is unremarkable. Stomach/Bowel: Stomach and majority of the small bowel are unremarkable. Right mid abdominal ileostomy. Postoperative changes in the distal ileum as well as rectosigmoid colon. There may be a rectal wall thickening. Vascular/Lymphatic: Atherosclerotic calcification of the arterial vasculature. Aortobifemoral bypass grafting. Porta hepatis lymph nodes measure up to 1.4 cm, stable. Retroperitoneal lymph nodes are not enlarged by CT size criteria. Inguinal lymph nodes measure up to 10 mm on the right, possibly reactive. Reproductive: Hysterectomy.  Ovaries are not well-visualized. Other: Elongated collection of fluid and air in the right anatomic pelvis measures 2.3 x 7.6 cm (series 2, image 60), unchanged when remeasured on 07/30/2015. It is situated in close proximity to the sigmoid anastomosis (cyst image 63). Internal air is new when compared with 07/30/2015. Musculoskeletal: No worrisome lytic or sclerotic lesions. IMPRESSION: 1. Collection of fluid and air in the right posterior anatomic pelvis. Air within is new and could be due to an abscess. Anastomotic leak is not excluded. 2. Question rectal wall thickening. 3. Hepatomegaly. Electronically Signed   By: Lorin Picket M.D.   On: 09/03/2015 12:25   Dg Chest Port 1 View  08/23/2015  CLINICAL DATA:  Acute onset of sepsis. Assess for source of infection. Initial encounter. EXAM: PORTABLE CHEST 1 VIEW COMPARISON:  Chest radiograph performed 10/13/2013 FINDINGS: The lungs are well-aerated and clear. There is no evidence of focal opacification, pleural effusion or pneumothorax. The cardiomediastinal silhouette is within normal limits. No acute osseous  abnormalities are seen. IMPRESSION: No acute cardiopulmonary process seen. Electronically Signed   By: Garald Balding M.D.   On: 08/23/2015 01:31   Dg Abd 2 Views  08/10/2015  CLINICAL DATA:  55 year old female undergoing intraoperative arteriogram EXAM: DG C-ARM 61-120 MIN; ABDOMEN - 2 VIEW COMPARISON:  CT abdomen/ pelvis 07/30/2015 FINDINGS: A cine loop demonstrates a vascular sheath in the common femoral artery. Contrast injection demonstrates a patent common femoral artery. The external iliac artery is occluded in the mid pelvis. Filling of internal iliac artery branches is noted via deep circumflex iliac collaterals. The inferior epigastric artery is widely patent. Surgical clips along the right pelvic sidewall suggest prior nodal dissection. The second image demonstrates a  wire along the expected path of the external iliac artery. IMPRESSION: Intraoperative arteriogram as above. 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 Electronically Signed   By: Jacqulynn Cadet M.D.   On: 08/10/2015 15:34   Dg C-arm 1-60 Min  08/10/2015  CLINICAL DATA:  55 year old female undergoing intraoperative arteriogram EXAM: DG C-ARM 61-120 MIN; ABDOMEN - 2 VIEW COMPARISON:  CT abdomen/ pelvis 07/30/2015 FINDINGS: A cine loop demonstrates a vascular sheath in the common femoral artery. Contrast injection demonstrates a patent common femoral artery. The external iliac artery is occluded in the mid pelvis. Filling of internal iliac artery branches is noted via deep circumflex iliac collaterals. The inferior epigastric artery is widely patent. Surgical clips along the right pelvic sidewall suggest prior nodal dissection. The second image demonstrates a wire along the expected path of the external iliac artery. IMPRESSION: Intraoperative arteriogram as above. 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 Electronically Signed   By: Jacqulynn Cadet M.D.   On: 08/10/2015 15:34    Dg Colon W/water Sol Cm  09/03/2015  CLINICAL DATA:  55 year old female with history of low anterior resection and loop ileostomy. High output from the ileostomy. Evaluate colorectal anastomosis for stricture or anastomotic leak. EXAM: COLON WITH WATER SOLUTION CONTRAST COMPARISON:  No priors. FINDINGS: Preprocedure KUB demonstrated numerous surgical clips throughout the pelvic sidewall bilaterally extending into the retroperitoneal region, presumably from prior lymph node dissection. A suture line in the mid anatomic pelvis was also noted. Nonobstructive bowel gas pattern. The patient was initially imaged in the LPO position, and upon infusion of water-soluble contrast via the rectum, the rectum and colon were opacified. This demonstrated some narrowing at the site of the anastomosis which appeared rather mild. At the time of the examination, no obvious extravasation was noted. However, upon further evaluation of the images at the work station, subtle areas of contrast extravasation are noted posteriorly and to the right side at the level of the anastomosis. IMPRESSION: 1. Study is positive for anastomotic leak. In addition, there is mild luminal narrowing at the rectosigmoid anastomosis. These results were called by telephone at the time of interpretation on 09/03/2015 at [id number] pm to Dr. Leighton Ruff, who verbally acknowledged these results. Electronically Signed   By: Vinnie Langton M.D.   On: 09/03/2015 15:19    Labs:  CBC:  Recent Labs  08/24/15 0745 08/25/15 0400 09/02/15 1205 09/03/15 0529  WBC 10.4 10.2 15.7* 17.1*  HGB 9.4* 8.3* 11.5* 10.4*  HCT 28.7* 25.3* 35.0* 32.3*  PLT 264 228 372 392    COAGS:  Recent Labs  07/31/15 0026  08/08/15 0753 08/08/15 1817 08/09/15 0400 08/10/15 0101 08/23/15 0937  INR 1.20  --   --   --  1.25 1.17  --   APTT 28  < > 52* 54* 69*  --  52*  < > = values in this interval not displayed.  BMP:  Recent Labs  08/24/15 0745 08/25/15 0400  09/02/15 1205 09/03/15 0529  NA 130* 135 129* 134*  K 4.1 4.0 3.9 4.1  CL 103 110 93* 102  CO2 19* 19* 24 21*  GLUCOSE 103* 121* 118* 120*  BUN 32* 15 18 15   CALCIUM 8.4* 7.8* 9.3 9.1  CREATININE 1.27* 0.87 1.17* 0.94  GFRNONAA 47* >60 52* >60  GFRAA 55* >60 >60 >60    LIVER FUNCTION TESTS:  Recent Labs  08/09/15 0400 08/10/15 0101 08/23/15 0321 08/24/15 0745  BILITOT 0.3 0.4 0.6 0.4  AST 10* 14* 13* 13*  ALT 9* 12* 12* 12*  ALKPHOS 124 137* 189* 158*  PROT 5.2* 5.5* 6.5 5.9*  ALBUMIN 2.4* 2.6* 3.3* 3.0*    TUMOR MARKERS: No results for input(s): AFPTM, CEA, CA199, CHROMGRNA in the last 8760 hours.  Assessment and Plan:  55 y.o. female with significant past medical history including hypertension, hyperlipidemia, asthma, anemia and remote cervical cancer with prior TAH/lymph node resection and radiation therapy in 1994. Due to colonic stricture the patient underwent LAR with loop ileostomy/ureteral stent placement in February 2017. Postop she was noted to have splenic infarcts as well as aortic thrombosis with acute kidney injury. In March 2017 she underwent a femorofemoral bypass graft with thrombectomy of the right iliac artery by Dr. Donnetta Hutching. She was recently admitted this month with nausea, vomiting, dehydration and leukocytosis and also noted to have high ostomy output. CT scan of abdomen and pelvis on 09/03/15 revealed a collection of fluid and air in the right posterior anatomic pelvis possibly representing abscess formation. Barium enema on 4/24 revealed an anastomotic leak. Request now made for CT-guided aspiration and possible drainage of the pelvic fluid collection. Imaging studies have been reviewed by Dr. Pascal Lux. Case discussed with Dr. Marcello Moores. Eliquis has been held since 4/23. Procedure tentatively scheduled for later today. Risks and benefits discussed with the patient including bleeding, infection, damage to adjacent structures, bowel perforation/fistula connection, and  sepsis as well as possible inability to place drain .All of the patient's questions were answered, patient is agreeable to proceed.Consent signed and in chart.       Thank you for this interesting consult.  I greatly enjoyed meeting [facility] and look forward to participating in their care.  A copy of this report was sent to the requesting provider on this date.  Electronically Signed: D. Rowe Robert 09/04/2015, 10:33 AM   I spent a total of 30 minutes in face to face in clinical consultation, greater than 50% of which was counseling/coordinating care for CT guided aspiration/possible drainage of pelvic fluid collection

## 2015-09-04 NOTE — Progress Notes (Signed)
ANTICOAGULATION CONSULT NOTE - Initial Consult  Pharmacy Consult for Heparin Indication: aortic thrombosis  Allergies  Allergen Reactions  . Codeine Other (See Comments)    "hyper"   Patient Measurements: Height: 5\' 2"  (157.5 cm) Weight: 180 lb (81.647 kg) IBW/kg (Calculated) : 50.1 Heparin Dosing Weight: 68kg  Vital Signs: Temp: 98 F (36.7 C) (04/25 0501) Temp Source: Oral (04/25 0501) BP: 94/55 mmHg (04/25 0501) Pulse Rate: 92 (04/25 0501)  Labs:  Recent Labs  09/02/15 1205 09/03/15 0529 09/04/15 1053  HGB 11.5* 10.4*  --   HCT 35.0* 32.3*  --   PLT 372 392  --   LABPROT  --   --  13.9  INR  --   --  1.05  CREATININE 1.17* 0.94  --    Estimated Creatinine Clearance: 68.5 mL/min (by C-G formula based on Cr of 0.94).  Medical History: Past Medical History  Diagnosis Date  . Hypertension   . Hyperlipidemia   . Asthma     pt brought her proair inhaler 06-06-14  . Colon polyp     hyperplastic  . Colon stricture ([place])   . Colonic obstruction ([place])   . Depression   . History of hiatal hernia   . Family history of adverse reaction to anesthesia     pts mother experiences nausea and vomiting   . Shortness of breath dyspnea     seasonal   . Anemia   . GERD (gastroesophageal reflux disease)   . Migraine     "q couple years maybe" (08/07/2015)  . Anxiety   . Acute renal failure (ARF) ([place]) 08/07/2015  . 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 Cervical cancer ([facility])    Medications:  Scheduled:  . apixaban  5 mg Oral BID  . atorvastatin  10 mg Oral q morning - 10a  . cholestyramine  4 g Oral Daily  . diphenoxylate-atropine  2 tablet Oral TID AC & HS  . ertapenem  1 g Intravenous Q24H  . folic acid  1 mg Oral Daily  . loperamide  4 mg Oral TID AC & HS  . LORazepam  0.5 mg Oral q morning - 10a  . pantoprazole  40 mg Oral QHS  . polycarbophil  625 mg Oral BID  . sertraline  100 mg Oral q morning - 10a  . vitamin B-12  100 mcg Oral Daily    Infusions:  . sodium chloride 200 mL/hr at 09/04/15 1250    Assessment: High output ileostomy and Barium enema with anastomotic leak CT scan abdomen and pelvis shows abscess. IR to eval for drainage or aspiration Eliquis held since 4/23 pm.  If no procedure today 4/25, start hep gtt  Apixaban 5mg  bid PTA: aortic thrombosis,  Last dose 4/23 pm, held for procedure 4/24, held for NPO 4/25  Goal of Therapy:  Heparin level 0.3-0.7 units/ml Monitor platelets by anticoagulation protocol: Yes   Plan:   Heparin 1000 units/hr, no loading dose at this point  Baseline aPTT, follow with Heparin levels   6 hr Heparin level  Daily CBC  Kayte Borchard L 09/04/2015,2:22 PM

## 2015-09-05 ENCOUNTER — Encounter (HOSPITAL_COMMUNITY)
Admission: AD | Disposition: A | Discharge status: Home Health | Payer: Self-pay | Source: Ambulatory Visit | Attending: General Surgery

## 2015-09-05 ENCOUNTER — Inpatient Hospital Stay (HOSPITAL_COMMUNITY): Payer: BLUE CROSS/BLUE SHIELD | Admitting: Certified Registered Nurse Anesthetist

## 2015-09-05 ENCOUNTER — Encounter (HOSPITAL_COMMUNITY): Payer: Self-pay | Admitting: Radiology

## 2015-09-05 ENCOUNTER — Inpatient Hospital Stay (HOSPITAL_COMMUNITY): Payer: BLUE CROSS/BLUE SHIELD

## 2015-09-05 DIAGNOSIS — T82868A Thrombosis of vascular prosthetic devices, implants and grafts, initial encounter: Secondary | ICD-10-CM

## 2015-09-05 HISTORY — PX: THROMBECTOMY FEMORAL ARTERY: SHX6406

## 2015-09-05 LAB — CBC
HEMATOCRIT: 26.8 % — AB (ref 36.0–46.0)
HEMOGLOBIN: 8.4 g/dL — AB (ref 12.0–15.0)
MCH: 26 pg (ref 26.0–34.0)
MCHC: 31.3 g/dL (ref 30.0–36.0)
MCV: 83 fL (ref 78.0–100.0)
Platelets: 240 10*3/uL (ref 150–400)
RBC: 3.23 MIL/uL — AB (ref 3.87–5.11)
RDW: 16.9 % — ABNORMAL HIGH (ref 11.5–15.5)
WBC: 7.8 10*3/uL (ref 4.0–10.5)

## 2015-09-05 LAB — HEPARIN LEVEL (UNFRACTIONATED): Heparin Unfractionated: 0.13 IU/mL — ABNORMAL LOW (ref 0.30–0.70)

## 2015-09-05 LAB — GLUCOSE, CAPILLARY: GLUCOSE-CAPILLARY: 158 mg/dL — AB (ref 65–99)

## 2015-09-05 SURGERY — THROMBECTOMY, ARTERY, FEMORAL
Anesthesia: General | Site: Groin | Laterality: Bilateral

## 2015-09-05 MED ORDER — MORPHINE SULFATE (PF) 2 MG/ML IV SOLN
2.0000 mg | INTRAVENOUS | Status: DC | PRN
  Administered 2015-09-05: 4 mg via INTRAVENOUS
  Administered 2015-09-06 (×2): 2 mg via INTRAVENOUS
  Filled 2015-09-05: qty 1
  Filled 2015-09-05: qty 2
  Filled 2015-09-05: qty 1

## 2015-09-05 MED ORDER — HEPARIN (PORCINE) IN NACL 100-0.45 UNIT/ML-% IJ SOLN
1450.0000 [IU]/h | INTRAMUSCULAR | Status: DC
  Administered 2015-09-05: 1450 [IU]/h via INTRAVENOUS
  Filled 2015-09-05: qty 250

## 2015-09-05 MED ORDER — ACETAMINOPHEN 650 MG RE SUPP
325.0000 mg | RECTAL | Status: DC | PRN
Start: 2015-09-05 — End: 2015-09-08

## 2015-09-05 MED ORDER — LACTATED RINGERS IV SOLN
INTRAVENOUS | Status: DC | PRN
  Administered 2015-09-05: 19:00:00 via INTRAVENOUS

## 2015-09-05 MED ORDER — MORPHINE SULFATE (PF) 4 MG/ML IV SOLN
4.0000 mg | Freq: Once | INTRAVENOUS | Status: AC
  Administered 2015-09-05: 4 mg via INTRAVENOUS
  Filled 2015-09-05: qty 1

## 2015-09-05 MED ORDER — GUAIFENESIN-DM 100-10 MG/5ML PO SYRP: 15.0000 mL | ORAL_SOLUTION | ORAL | Status: DC | PRN

## 2015-09-05 MED ORDER — DEXTROSE 5 % IV SOLN
10.0000 mg | INTRAVENOUS | Status: DC | PRN
  Administered 2015-09-05: 50 ug/min via INTRAVENOUS

## 2015-09-05 MED ORDER — DOCUSATE SODIUM 100 MG PO CAPS
100.0000 mg | ORAL_CAPSULE | Freq: Every day | ORAL | Status: DC
  Filled 2015-09-05: qty 1

## 2015-09-05 MED ORDER — MIDAZOLAM HCL 2 MG/2ML IJ SOLN
INTRAMUSCULAR | Status: AC
  Filled 2015-09-05: qty 4

## 2015-09-05 MED ORDER — LABETALOL HCL 5 MG/ML IV SOLN: 10.0000 mg | INTRAVENOUS | Status: DC | PRN

## 2015-09-05 MED ORDER — 0.9 % SODIUM CHLORIDE (POUR BTL) OPTIME
TOPICAL | Status: DC | PRN
  Administered 2015-09-05: 1000 mL

## 2015-09-05 MED ORDER — WHITE PETROLATUM GEL
Status: AC
  Administered 2015-09-06: 0.2
  Filled 2015-09-05: qty 1

## 2015-09-05 MED ORDER — MIDAZOLAM HCL 2 MG/2ML IJ SOLN
INTRAMUSCULAR | Status: AC | PRN
  Administered 2015-09-05 (×4): 0.5 mg via INTRAVENOUS

## 2015-09-05 MED ORDER — PIPERACILLIN-TAZOBACTAM 3.375 G IVPB: 3.3750 g | Freq: Four times a day (QID) | INTRAVENOUS | Status: DC

## 2015-09-05 MED ORDER — HEPARIN SODIUM (PORCINE) 1000 UNIT/ML IJ SOLN
INTRAMUSCULAR | Status: DC | PRN
  Administered 2015-09-05: 6000 [IU] via INTRAVENOUS

## 2015-09-05 MED ORDER — HEPARIN BOLUS VIA INFUSION
2000.0000 [IU] | Freq: Once | INTRAVENOUS | Status: AC
  Administered 2015-09-05: 2000 [IU] via INTRAVENOUS
  Filled 2015-09-05: qty 2000

## 2015-09-05 MED ORDER — FENTANYL CITRATE (PF) 100 MCG/2ML IJ SOLN
INTRAMUSCULAR | Status: AC
  Filled 2015-09-05: qty 2

## 2015-09-05 MED ORDER — HYDRALAZINE HCL 20 MG/ML IJ SOLN: 5.0000 mg | INTRAMUSCULAR | Status: DC | PRN

## 2015-09-05 MED ORDER — METOPROLOL TARTRATE 5 MG/5ML IV SOLN: 2.0000 mg | INTRAVENOUS | Status: DC | PRN

## 2015-09-05 MED ORDER — ACETAMINOPHEN 325 MG PO TABS
325.0000 mg | ORAL_TABLET | ORAL | Status: DC | PRN
Start: 2015-09-05 — End: 2015-09-08

## 2015-09-05 MED ORDER — MAGNESIUM SULFATE 2 GM/50ML IV SOLN: 2.0000 g | Freq: Every day | INTRAVENOUS | Status: DC | PRN

## 2015-09-05 MED ORDER — SODIUM CHLORIDE 0.9 % IV SOLN: 10.0000 mL/h | Freq: Once | INTRAVENOUS | Status: DC

## 2015-09-05 MED ORDER — HEPARIN (PORCINE) IN NACL 100-0.45 UNIT/ML-% IJ SOLN
1450.0000 [IU]/h | INTRAMUSCULAR | Status: AC
  Filled 2015-09-05: qty 250

## 2015-09-05 MED ORDER — APIXABAN 5 MG PO TABS
5.0000 mg | ORAL_TABLET | Freq: Two times a day (BID) | ORAL | Status: DC
  Filled 2015-09-05: qty 1

## 2015-09-05 MED ORDER — VANCOMYCIN HCL IN DEXTROSE 1-5 GM/200ML-% IV SOLN
1000.0000 mg | Freq: Once | INTRAVENOUS | Status: AC
  Administered 2015-09-06: 1000 mg via INTRAVENOUS
  Filled 2015-09-05: qty 200

## 2015-09-05 MED ORDER — LIDOCAINE HCL (CARDIAC) 20 MG/ML IV SOLN
INTRAVENOUS | Status: DC | PRN
  Administered 2015-09-05: 50 mg via INTRAVENOUS

## 2015-09-05 MED ORDER — FENTANYL CITRATE (PF) 100 MCG/2ML IJ SOLN
INTRAMUSCULAR | Status: DC | PRN
  Administered 2015-09-05 (×2): 50 ug via INTRAVENOUS
  Administered 2015-09-05: 100 ug via INTRAVENOUS

## 2015-09-05 MED ORDER — SODIUM CHLORIDE 0.9 % IV SOLN
500.0000 mL | Freq: Once | INTRAVENOUS | Status: AC | PRN
  Administered 2015-09-06: 500 mL via INTRAVENOUS

## 2015-09-05 MED ORDER — ALBUMIN HUMAN 5 % IV SOLN
INTRAVENOUS | Status: DC | PRN
  Administered 2015-09-05: 19:00:00 via INTRAVENOUS

## 2015-09-05 MED ORDER — POTASSIUM CHLORIDE CRYS ER 20 MEQ PO TBCR: 20.0000 meq | EXTENDED_RELEASE_TABLET | Freq: Every day | ORAL | Status: DC | PRN

## 2015-09-05 MED ORDER — SODIUM CHLORIDE 0.9 % IV SOLN
INTRAVENOUS | Status: DC | PRN
  Administered 2015-09-05: 20:00:00

## 2015-09-05 MED ORDER — PROPOFOL 10 MG/ML IV BOLUS
INTRAVENOUS | Status: DC | PRN
  Administered 2015-09-05: 140 mg via INTRAVENOUS

## 2015-09-05 MED ORDER — PIPERACILLIN-TAZOBACTAM 3.375 G IVPB 30 MIN
3.3750 g | Freq: Once | INTRAVENOUS | Status: DC
Start: 2015-09-05 — End: 2015-09-05
  Filled 2015-09-05 (×2): qty 50

## 2015-09-05 MED ORDER — FENTANYL CITRATE (PF) 100 MCG/2ML IJ SOLN
INTRAMUSCULAR | Status: AC | PRN
  Administered 2015-09-05 (×4): 25 ug via INTRAVENOUS

## 2015-09-05 MED ORDER — HEPARIN (PORCINE) IN NACL 100-0.45 UNIT/ML-% IJ SOLN: 1450.0000 [IU]/h | INTRAMUSCULAR | Status: DC

## 2015-09-05 MED ORDER — VANCOMYCIN HCL 1000 MG IV SOLR
1000.0000 mg | INTRAVENOUS | Status: DC | PRN
  Administered 2015-09-05: 1000 mg via INTRAVENOUS

## 2015-09-05 MED ORDER — SUCCINYLCHOLINE CHLORIDE 20 MG/ML IJ SOLN
INTRAMUSCULAR | Status: DC | PRN
  Administered 2015-09-05: 110 mg via INTRAVENOUS

## 2015-09-05 MED ORDER — OXYCODONE-ACETAMINOPHEN 5-325 MG PO TABS
1.0000 | ORAL_TABLET | ORAL | Status: DC | PRN
  Administered 2015-09-06: 2 via ORAL
  Administered 2015-09-06: 1 via ORAL
  Administered 2015-09-07 (×2): 2 via ORAL
  Filled 2015-09-05: qty 2
  Filled 2015-09-05: qty 1
  Filled 2015-09-05 (×2): qty 2

## 2015-09-05 MED ORDER — PHENYLEPHRINE HCL 10 MG/ML IJ SOLN
INTRAMUSCULAR | Status: DC | PRN
  Administered 2015-09-05: 80 ug via INTRAVENOUS

## 2015-09-05 MED ORDER — HYDROMORPHONE HCL 1 MG/ML IJ SOLN: 0.2500 mg | INTRAMUSCULAR | Status: DC | PRN

## 2015-09-05 MED ORDER — HEPARIN (PORCINE) IN NACL 100-0.45 UNIT/ML-% IJ SOLN
1050.0000 [IU]/h | INTRAMUSCULAR | Status: DC
  Administered 2015-09-05: 900 [IU]/h via INTRAVENOUS
  Administered 2015-09-06: 1050 [IU]/h via INTRAVENOUS
  Filled 2015-09-05: qty 250

## 2015-09-05 MED ORDER — ALUM & MAG HYDROXIDE-SIMETH 200-200-20 MG/5ML PO SUSP: 15.0000 mL | ORAL | Status: DC | PRN

## 2015-09-05 MED ORDER — PIPERACILLIN-TAZOBACTAM 3.375 G IVPB 30 MIN
3.3750 g | Freq: Four times a day (QID) | INTRAVENOUS | Status: DC
  Administered 2015-09-06 (×2): 3.375 g via INTRAVENOUS
  Filled 2015-09-05 (×3): qty 50

## 2015-09-05 MED ORDER — SODIUM CHLORIDE 0.9 % IV SOLN
INTRAVENOUS | Status: DC | PRN
  Administered 2015-09-05: 19:00:00 via INTRAVENOUS

## 2015-09-05 MED ORDER — PHENOL 1.4 % MT LIQD: 1.0000 | OROMUCOSAL | Status: DC | PRN

## 2015-09-05 MED ORDER — SODIUM CHLORIDE 0.9 % IV SOLN
INTRAVENOUS | Status: DC
  Administered 2015-09-06 (×2): via INTRAVENOUS

## 2015-09-05 SURGICAL SUPPLY — 46 items
CANISTER SUCTION 2500CC (MISCELLANEOUS) ×4 IMPLANT
CATH EMB 4FR 80CM (CATHETERS) ×4 IMPLANT
CATH EMB 5FR 80CM (CATHETERS) ×3 IMPLANT
CLIP TI MEDIUM 24 (CLIP) ×4 IMPLANT
CLIP TI WIDE RED SMALL 24 (CLIP) ×4 IMPLANT
CONT SPEC STER OR (MISCELLANEOUS) ×3 IMPLANT
DRSG OPSITE 4X5.5 SM (GAUZE/BANDAGES/DRESSINGS) ×6 IMPLANT
ELECT REM PT RETURN 9FT ADLT (ELECTROSURGICAL) ×4
ELECTRODE REM PT RTRN 9FT ADLT (ELECTROSURGICAL) ×2 IMPLANT
GAUZE SPONGE 4X4 12PLY STRL (GAUZE/BANDAGES/DRESSINGS) ×4 IMPLANT
GLOVE BIO SURGEON STRL SZ 6.5 (GLOVE) ×2 IMPLANT
GLOVE BIO SURGEON STRL SZ7 (GLOVE) ×3 IMPLANT
GLOVE BIO SURGEONS STRL SZ 6.5 (GLOVE) ×1
GLOVE BIOGEL M 6.5 STRL (GLOVE) ×3 IMPLANT
GLOVE BIOGEL PI IND STRL 6.5 (GLOVE) ×1 IMPLANT
GLOVE BIOGEL PI IND STRL 7.0 (GLOVE) ×1 IMPLANT
GLOVE BIOGEL PI IND STRL 7.5 (GLOVE) ×1 IMPLANT
GLOVE BIOGEL PI INDICATOR 6.5 (GLOVE) ×2
GLOVE BIOGEL PI INDICATOR 7.0 (GLOVE) ×2
GLOVE BIOGEL PI INDICATOR 7.5 (GLOVE) ×2
GLOVE SS BIOGEL STRL SZ 7 (GLOVE) ×2 IMPLANT
GLOVE SUPERSENSE BIOGEL SZ 7 (GLOVE) ×2
GOWN STRL REUS W/ TWL LRG LVL3 (GOWN DISPOSABLE) ×6 IMPLANT
GOWN STRL REUS W/TWL LRG LVL3 (GOWN DISPOSABLE) ×12
INSERT FOGARTY SM (MISCELLANEOUS) ×10 IMPLANT
KIT BASIN OR (CUSTOM PROCEDURE TRAY) ×4 IMPLANT
KIT ROOM TURNOVER OR (KITS) ×4 IMPLANT
LIQUID BAND (GAUZE/BANDAGES/DRESSINGS) ×4 IMPLANT
NS IRRIG 1000ML POUR BTL (IV SOLUTION) ×8 IMPLANT
PACK PERIPHERAL VASCULAR (CUSTOM PROCEDURE TRAY) ×4 IMPLANT
PAD ARMBOARD 7.5X6 YLW CONV (MISCELLANEOUS) ×8 IMPLANT
STAPLER VISISTAT 35W (STAPLE) ×3 IMPLANT
SUT ETHILON 3 0 PS 1 (SUTURE) ×8 IMPLANT
SUT PROLENE 5 0 C 1 24 (SUTURE) ×14 IMPLANT
SUT PROLENE 5 0 C1 (SUTURE) IMPLANT
SUT PROLENE 6 0 BV (SUTURE) IMPLANT
SUT VIC AB 2-0 CTX 36 (SUTURE) ×8 IMPLANT
SUT VIC AB 2-0 SH 18 (SUTURE) ×3 IMPLANT
SUT VIC AB 3-0 SH 27 (SUTURE) ×8
SUT VIC AB 3-0 SH 27X BRD (SUTURE) ×4 IMPLANT
SUT VIC AB 3-0 SH 8-18 (SUTURE) ×4 IMPLANT
SYR 3ML LL SCALE MARK (SYRINGE) ×8 IMPLANT
TRAY FOLEY W/METER SILVER 16FR (SET/KITS/TRAYS/PACK) ×4 IMPLANT
TUBE ANAEROBIC SPECIMEN COL (MISCELLANEOUS) ×3 IMPLANT
UNDERPAD 30X30 INCONTINENT (UNDERPADS AND DIAPERS) ×4 IMPLANT
WATER STERILE IRR 1000ML POUR (IV SOLUTION) ×4 IMPLANT

## 2015-09-05 NOTE — Transfer of Care (Signed)
Immediate Anesthesia Transfer of Care Note  Patient: Cathy Jones  Procedure(s) Performed: Procedure(s): THROMBECTOMY Left to Right Femoral to  FEMORAL ARTERY Bypass,. (Bilateral)  Patient Location: PACU  Anesthesia Type:General  Level of Consciousness: awake and patient cooperative  Airway & Oxygen Therapy: Patient Spontanous Breathing and Patient connected to face mask oxygen  Post-op Assessment: Report given to RN and Post -op Vital signs reviewed and stable  Post vital signs: Reviewed and stable  Last Vitals:  Filed Vitals:   09/05/15 1340 09/05/15 1630  BP: 109/57 111/55  Pulse: 162 91  Temp: 37 C 36.5 C  Resp: 18 18    Last Pain:  Filed Vitals:   09/05/15 1902  PainSc: 8       Patients Stated Pain Goal: 2 ([id number])  Complications: No apparent anesthesia complications

## 2015-09-05 NOTE — Consult Note (Signed)
Vascular Surgery Consultation  Reason for Consult: Severe pain right leg  HPI: Cathy Jones is a 55 y.o. female who presents for evaluation of severe pain right leg. Patient had drainage of pelvic abscess and interventional radiology earlier today around 12:00 noon. Shortly thereafter after returning to the room patient developed pain in the right leg which has been severe and unrelenting. She has developed numbness in the right foot. She had a left-to-right femoral-femoral bypass performed by Dr. Sherren Mocha early about 3-4 weeks ago. He saw her last week in the bypasses functioning adequately. She has been on anticoagulation until prior to this admission.   Past Medical History  Diagnosis Date  . Hypertension   . Hyperlipidemia   . Asthma     pt brought her proair inhaler 06-06-14  . Colon polyp     hyperplastic  . Colon stricture ([place])   . Colonic obstruction ([place])   . Depression   . History of hiatal hernia   . Family history of adverse reaction to anesthesia     pts mother experiences nausea and vomiting   . Shortness of breath dyspnea     seasonal   . Anemia   . GERD (gastroesophageal reflux disease)   . Migraine     "q couple years maybe" (08/07/2015)  . Anxiety   . Acute renal failure (ARF) ([place]) 08/07/2015  . 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 Cervical cancer [facility])    Past Surgical History  Procedure Laterality Date  . Colonoscopy    . Upper gastrointestinal endoscopy    . Tubal ligation    . Colon resection N/A 06/27/2015    Procedure: LYSIS OF ADHESIONS LOW ANTERIOR RESECTION DIVERTING LOOP ILEOSTOMY;  Surgeon: Leighton Ruff, MD;  Location: [facility];  Service: General;  Laterality: N/A;  . Diverting ileostomy N/A 06/27/2015    Procedure: DIVERTING LOOP ILEOSTOMY;  Surgeon: Leighton Ruff, MD;  Location: [facility];  Service: General;  Laterality: N/A;  . Cystoscopy with stent placement Bilateral 06/27/2015    Procedure: CYSTOSCOPY WITH CATHETER  PLACEMENT;   Surgeon: Franchot Gallo, MD;  Location: [facility];  Service: Urology;  Laterality: Bilateral;  . Colon surgery    . Abdominal hysterectomy  1994  . Peripheral vascular catheterization N/A 08/09/2015    Procedure: Abdominal Aortogram w/Lower Extremity;  Surgeon: Conrad Dasher, MD;  Location: [place] [facility];  Service: Cardiovascular;  Laterality: N/A;  . Peripheral vascular catheterization Left 08/09/2015    Procedure: Peripheral Vascular Intervention;  Surgeon: Conrad Platter, MD;  Location: [place] [facility];  Service: Cardiovascular;  Laterality: Left;  common iliac  . Femoral-femoral bypass graft Bilateral 08/10/2015    Procedure: BYPASS GRAFT LEFT FEMORALTO RIGHT FEMORAL ARTERY;  Surgeon: Rosetta Posner, MD;  Location: [place];  Service: Vascular;  Laterality: Bilateral;  . Thrombectomy iliac artery Right 08/10/2015    Procedure: THROMBECTOMY Right ILIAC ARTERY;  Surgeon: Rosetta Posner, MD;  Location: [place];  Service: Vascular;  Laterality: Right;  . Intraoperative arteriogram Right 08/10/2015    Procedure: INTRA OPERATIVE ARTERIOGRAM Right Iliac Artery;  Surgeon: Rosetta Posner, MD;  [facility];  Service: Vascular;  Laterality: Right;   Social History   Social History  . Marital Status: Married    Spouse Name: N/A  . Number of Children: 1  . Years of Education: N/A   Social History Main Topics  . Smoking status: Former Smoker -- 1.00 packs/day for 40 years  Types: Cigarettes    Quit date: 05/09/2015  . Smokeless tobacco: Never Used  . Alcohol Use: No  . Drug Use: No  . Sexual Activity: Yes   Other Topics Concern  . None   Social History Narrative   Family History  Problem Relation Age of Onset  . Colon cancer Neg Hx   . Stomach cancer Neg Hx   . Esophageal cancer Neg Hx   . Deep vein thrombosis Neg Hx   . Rectal cancer Cousin   . Hypertension Mother   . Hyperparathyroidism Mother   . Hypertension Father   . CAD Father     CABG  . Peripheral vascular disease Father    . Graves' disease Father    Allergies  Allergen Reactions  . Codeine Other (See Comments)    "hyper"   Prior to Admission medications   Medication Sig Start Date End Date Taking? Authorizing Provider  apixaban (ELIQUIS) 5 MG TABS tablet Take 1 tablet (5 mg total) by mouth 2 (two) times daily. 08/08/15  Yes Debbe Odea, MD  cholestyramine (QUESTRAN) 4 g packet Take 1 packet (4 g total) by mouth daily. 08/25/15  Yes Maryann Mikhail, DO  diphenoxylate-atropine (LOMOTIL) 2.5-0.025 MG tablet Take 2 tablets by mouth 4 (four) times daily -  before meals and at bedtime. 08/04/15  Yes Michael Boston, MD  ferrous sulfate 325 (65 FE) MG tablet Take 1 tablet (325 mg total) by mouth 2 (two) times daily with a meal. 08/04/15  Yes Michael Boston, MD  folic acid (FOLVITE) 1 MG tablet Take 1 tablet (1 mg total) by mouth daily. 08/04/15  Yes Debbe Odea, MD  loperamide (IMODIUM) 2 MG capsule Take 2 capsules (4 mg total) by mouth 4 (four) times daily -  before meals and at bedtime. 08/04/15  Yes Michael Boston, MD  LORazepam (ATIVAN) 0.5 MG tablet Take 0.5 mg by mouth every morning.   Yes Historical Provider, MD  ondansetron (ZOFRAN) 4 MG tablet Take 1 tablet (4 mg total) by mouth every 6 (six) hours as needed for nausea. 08/25/15  Yes Maryann Mikhail, DO  Opium 10 MG/ML (1%) TINC Take 1 mL by mouth 4 (four) times daily.   Yes Historical Provider, MD  oxyCODONE (OXY IR/ROXICODONE) 5 MG immediate release tablet Take 1 tablet (5 mg total) by mouth every 4 (four) hours as needed for moderate pain. 08/11/15  Yes Ulyses Amor, PA-C  pantoprazole (PROTONIX) 40 MG tablet Take 1 tablet (40 mg total) by mouth at bedtime. 08/25/15  Yes Maryann Mikhail, DO  polycarbophil (FIBERCON) 625 MG tablet Take 1 tablet (625 mg total) by mouth 2 (two) times daily. 08/04/15  Yes Michael Boston, MD  sertraline (ZOLOFT) 100 MG tablet Take one tablet by mouth every morning. 02/19/15  Yes Historical Provider, MD  vitamin B-12 1000 MCG tablet Take 0.5  tablets (500 mcg total) by mouth daily. 08/04/15  Yes Debbe Odea, MD     Positive ROS:   All other systems have been reviewed and were otherwise negative with the exception of those mentioned in the HPI and as above.  Physical Exam: Filed Vitals:   09/05/15 1340 09/05/15 1630  BP: 109/57 111/55  Pulse: 162 91  Temp: 98.6 F (37 C) 97.7 F (36.5 C)  Resp: 18 18    General: Alert, no acute distress HEENT: Normal for age Cardiovascular: Regular rate and rhythm. Carotid pulses 2+, no bruits audible Respiratory: Clear to auscultation. No cyanosis, no use of accessory musculature GI:  No organomegaly, abdomen is soft and non-tender Skin: No lesions in the area of chief complaint Neurologic: Sensation intact distally Psychiatric: Patient is competent for consent with normal mood and affect Musculoskeletal: No obvious deformities Extremities: Absent palpable pulse in femoral-femoral graft and right femoral artery. No flow audible in right foot. Right foot is numb with severe tenderness right calf. Left leg has no femoral pulse palpable but there is good triphasic Doppler flow as well as brisk Doppler flow in the left posterior tibial artery and audible Doppler flow in the left anterior tibial artery. Left foot has good motion and sensation with no calf tender   Assessment/Plan:  Patient needs emergent transfer to [facility] for attempt at thrombectomy of left-to-right femoral-femoral bypass. Possible fasciotomy. Discuss the urgency with the patient and her family and [facility] is transferring her now to short stay to go immediately to the operating room. Patient understands that this may be irreversible.   Tinnie Gens, MD 09/05/2015 6:10 PM

## 2015-09-05 NOTE — Sedation Documentation (Signed)
Patient denies pain and is resting comfortably.  

## 2015-09-05 NOTE — Progress Notes (Signed)
Pt complaining of R leg pain. 5mg  Oxy given an hour ago with no relief for leg pain, but buttocks pain subsided.  R leg cool to the touch with tight muscles.  No signs of redness or swelling. Cap refill is within 3 seconds. Weak dorsalis pedis pulses in both legs. MD paged.  Roselind Rily

## 2015-09-05 NOTE — Progress Notes (Signed)
Dehydration  Subjective:   Pt feeling better.  UOP increased  Objective: Vital signs in last 24 hours: Temp:  [98.2 F (36.8 C)-98.4 F (36.9 C)] 98.4 F (36.9 C) (04/26 0510) Pulse Rate:  [59-77] 59 (04/26 0510) Resp:  [14-16] 16 (04/26 0510) BP: (100-122)/(37-44) 100/37 mmHg (04/26 0510) SpO2:  [89 %-100 %] 99 % (04/26 0510) Last BM Date: 09/04/15  Intake/Output from previous day: 04/25 0701 - 04/26 0700 In: 5883.3 [P.O.:960; I.V.:4873.3; IV Piggyback:50] Out: 4075 [Urine:2750; Stool:1325] Intake/Output this shift:    General appearance: alert and cooperative GI: normal findings: soft, non-tender  Lab Results:  Results for orders placed or performed during the hospital encounter of 09/02/15 (from the past 24 hour(s))  Protime-INR     Status: None   Collection Time: 09/04/15 10:53 AM  Result Value Ref Range   Prothrombin Time 13.9 11.6 - 15.2 seconds   INR 1.05 0.00 - 1.49  APTT     Status: None   Collection Time: 09/04/15 10:53 AM  Result Value Ref Range   aPTT 28 24 - 37 seconds  Heparin level (unfractionated)     Status: Abnormal   Collection Time: 09/04/15  9:00 PM  Result Value Ref Range   Heparin Unfractionated <0.10 (L) 0.30 - 0.70 IU/mL  APTT     Status: None   Collection Time: 09/04/15  9:00 PM  Result Value Ref Range   aPTT 35 24 - 37 seconds  CBC     Status: Abnormal   Collection Time: 09/05/15  4:55 AM  Result Value Ref Range   WBC 7.8 4.0 - 10.5 K/uL   RBC 3.23 (L) 3.87 - 5.11 MIL/uL   Hemoglobin 8.4 (L) 12.0 - 15.0 g/dL   HCT 26.8 (L) 36.0 - 46.0 %   MCV 83.0 78.0 - 100.0 fL   MCH 26.0 26.0 - 34.0 pg   MCHC 31.3 30.0 - 36.0 g/dL   RDW 16.9 (H) 11.5 - 15.5 %   Platelets 240 150 - 400 K/uL  Heparin level (unfractionated)     Status: Abnormal   Collection Time: 09/05/15  4:55 AM  Result Value Ref Range   Heparin Unfractionated 0.13 (L) 0.30 - 0.70 IU/mL     Studies/Results Radiology   MEDS, Scheduled . atorvastatin  10 mg Oral q  morning - 10a  . cholestyramine  4 g Oral Daily  . diphenoxylate-atropine  2 tablet Oral TID AC & HS  . ertapenem  1 g Intravenous Q24H  . folic acid  1 mg Oral Daily  . loperamide  4 mg Oral TID AC & HS  . LORazepam  0.5 mg Oral q morning - 10a  . pantoprazole  40 mg Oral QHS  . polycarbophil  625 mg Oral BID  . sertraline  100 mg Oral q morning - 10a  . vitamin B-12  100 mcg Oral Daily     Assessment: Dehydration High output ileostomy  Plan: Barium enema shows anastomotic leak CT scan abdomen and pelvis shows pelvic abscess.  Cont Invanz.  IR to for drainage or aspiration Eliquis held.  On hep gtt Min IV fluids now that UOP has picked up.  Ostomy output better over the last 24h   LOS: 3 days    Rosario Adie, MD [facility], [place]   09/05/2015 9:02 AM

## 2015-09-05 NOTE — Progress Notes (Signed)
[place] for Heparin Indication: aortic thrombosis   Patient Measurements: Height: 5\' 2"  (157.5 cm) Weight: 180 lb (81.647 kg) IBW/kg (Calculated) : 50.1 Heparin Dosing Weight: 68kg  Infusions:  . sodium chloride 200 mL/hr at 09/04/15 2318  . heparin      Assessment: 37 yoF admitted on 4/23 with high ileostomy output. Barium enema showed anastomotic leak, CT scan shows abscess with plans for IR drainage or aspiration.  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  Her last Eliquis dose was on 4/23 PM.  Pharmacy is consulted to dose Heparin IV prior to procedure.  Today, 09/05/2015:  Heparin level = 0.13 is subtherapeutic on a rate of 1250 units/hr  RN confirms heparin at 12.5 ml/hr with no IV interruptions.  CBC: Hgb 8.4, Plt 240  No bleeding or complications reported  Goal of Therapy:  Heparin level 0.3-0.7 units/ml Monitor platelets by anticoagulation protocol: Yes   Plan:  Give heparin 2000 units bolus IV x 1 Increase heparin IV infusion to 1450 units/hr Heparin level 6 hours after rate change Daily heparin level and CBC Continue to monitor H&H and platelets  Leone Haven, PharmD  09/05/2015 6:05 AM

## 2015-09-05 NOTE — Anesthesia Procedure Notes (Signed)
Procedure Name: Intubation Date/Time: 09/05/2015 7:10 PM Performed by: Hollie Salk Z Pre-anesthesia Checklist: Patient identified, Timeout performed, Emergency Drugs available, Suction available and Patient being monitored Patient Re-evaluated:Patient Re-evaluated prior to inductionOxygen Delivery Method: Circle system utilized Preoxygenation: Pre-oxygenation with 100% oxygen Intubation Type: IV induction, Rapid sequence and Cricoid Pressure applied Laryngoscope Size: Mac and 3 Grade View: Grade I Tube type: Oral Tube size: 7.5 mm Number of attempts: 1 Airway Equipment and Method: Stylet Placement Confirmation: ETT inserted through vocal cords under direct vision,  breath sounds checked- equal and bilateral and positive ETCO2 Secured at: 22 cm Tube secured with: Tape Dental Injury: Teeth and Oropharynx as per pre-operative assessment

## 2015-09-05 NOTE — Anesthesia Preprocedure Evaluation (Addendum)
Anesthesia Evaluation  Patient identified by MRN, date of birth, ID band Patient awake    Reviewed: Allergy & Precautions, NPO status , Patient's Chart, lab work & pertinent test results  Airway Mallampati: II  TM Distance: >3 FB Neck ROM: Full    Dental  (+) Teeth Intact, Partial Upper, Dental Advisory Given   Pulmonary shortness of breath, asthma , former smoker,    breath sounds clear to auscultation       Cardiovascular hypertension, + Peripheral Vascular Disease   Rhythm:Regular Rate:Normal  Clot to leg c vascular insufficiency   Neuro/Psych    GI/Hepatic hiatal hernia, GERD  ,Abd abscess   Endo/Other    Renal/GU Renal disease     Musculoskeletal   Abdominal (+) + obese,   Peds  Hematology   Anesthesia Other Findings   Reproductive/Obstetrics                            Anesthesia Physical Anesthesia Plan  ASA: III  Anesthesia Plan: General   Post-op Pain Management:    Induction: Intravenous  Airway Management Planned: Oral ETT  Additional Equipment:   Intra-op Plan:   Post-operative Plan: Extubation in OR  Informed Consent: I have reviewed the patients History and Physical, chart, labs and discussed the procedure including the risks, benefits and alternatives for the proposed anesthesia with the patient or authorized representative who has indicated his/her understanding and acceptance.   Dental advisory given  Plan Discussed with: CRNA and Surgeon  Anesthesia Plan Comments:         Anesthesia Quick Evaluation

## 2015-09-05 NOTE — Op Note (Signed)
OPERATIVE REPORT  Date of Surgery: 09/02/2015 - 09/05/2015  Surgeon: Tinnie Gens, MD  Assistant: Gerri Lins PA  Pre-op Diagnosis: ischemic leg, right due to thrombosis of left to right femoral-femoral bypass  Post-op Diagnosis: Ischemic leg right   Procedure: Procedure(s): THROMBECTOMY of left-to-right femoral-femoral bypass via right inguinal approach  Anesthesia: General  EBL: [id number] cc  Complications: None  Procedure Details: The patient was taken the operating room placed in supine position urgently after being transferred from [facility] with severely ischemic right leg. Both inguinal areas and the entire right lower extremity were prepped Betadine scrub and solution regular routine sterile manner. Care was taken to isolate the ileostomy bag which was emptied prior to the beginning of the procedure it was in the right lower quadrant was carefully draped out of the wound and adhesive drapes were utilized. Longitudinal incision was made in the right groin through the previous scar where the graft and inserted but 3-4 weeks earlier. The left-to-right femoral-femoral bypass graft was encircled for proximal control. It was not incorporated into the tissues in the crossover portion. There was some slightly brownish fluid which did not really appear purulence was cultured and Gram stain was sent. The superficial femoral and profunda femoris arteries were dissected free distally encircled with Vesseloops. The artery and graft were densely adherent to the femoral vein medially and this was not totally mobilized. Patient was then heparinized transverse opening made in the hood of the anastomosis to the right common femoral artery through the Dacron. There was fresh thrombus filling this area. A 5 Fogarty catheter was easily passed across the femoral-femoral graft and easily thrombectomized with excellent inflow being reestablished. Several passes with the Fogarty were made with no  additional clot. Following this a 5 and a 4 Fogarty catheter was passed down the superficial femoral artery went to the ankle level with no thrombus retrieved from the lower leg or the thigh. All of the thrombus appeared to be in the common femoral artery. Fogarty was also passed down the profunda with no thrombus noted in it. There was good backbleeding from all these vessels. After thoroughly irrigating with heparin saline the graft was reclosed with continuous 5-0 Prolene suture. No protamine was given. Adequate hemostasis was achieved and the wound was closed in layers with interrupted 2-0 Vicryl for the deep layer interrupted 3-0 Vicryl subcutaneous tissue and interrupted 3-0 nylon vertical mattress sutures for skin sterile adhesive dressing was applied to carefully isolate this from the ileostomy area and the patient was taken recovery room in satisfactory condition with audible Doppler flow in the posterior tibial artery and foot appearing much better perfused with some pink coloration of the toes. Exline patient received 2 units packed red blood cells and was stable hemodynamically during the case Tinnie Gens, MD 09/05/2015 8:39 PM

## 2015-09-05 NOTE — Progress Notes (Signed)
Pharmacy - Heparin > Apixaban dosing  Assessment:    Please see note from Dalbert Mayotte, PharmD earlier today for full details.  Briefly, 55 y.o. female on heparin for aortic thrombus; to switch to Eliquis later this evening.  However, new thrombus of femoral bypass required transfer to [facility] for emergency thrombectomy.  Heparin still infusing at time of transfer via [facility] to [place]:   Have discontinued Eliquis order and communicated with [place] will follow up with anticoagulation plans postoperatively  Reuel Boom, PharmD, BCPS Pager: [phone] 09/05/2015, 8:38 PM

## 2015-09-05 NOTE — Progress Notes (Signed)
Called by RN stating pt having worsening pain in her R leg since returning from IR.  Pt with a h/o fem fem bypass by Dr Donnetta Hutching for ischemic RLE ~1 month ago.  Pt has been on Elliquis.  This was held for IR procedure and pt was maintained on hep gtt.  Hep gtt has been restarted.  On exam I do not palpate a pulse in her graft.  No obvious DP pulse noted either.  R foot is cool to touch.  Motor function limited dorsal and plantar flexion.  Calf soft.  Vascular surgeon on call was paged and he agrees to come see patient.  Findings concerning for occluded graft.  Pt made NPO for possible surgery later this evening.    Rosario Adie, MD  Colorectal and [facility] Surgery

## 2015-09-05 NOTE — Progress Notes (Signed)
Pt for emergent transfer to [facility]. [facility] at bedside to take patient straight to OR upon transfer. Consent form signed. All belongings gathered by family.  Roselind Rily

## 2015-09-05 NOTE — Procedures (Signed)
Interventional Radiology Procedure Note  Procedure: CT guided placement of a 20F drain into perirectal abscess via right transgluteal approach.  Aspiration yields 16 mL foul-smelling purulent fluid.   Complications: None  Estimated Blood Loss: 0 mL  Recommendations: - To JP bulb suction  Signed,  Criselda Peaches, MD

## 2015-09-06 ENCOUNTER — Encounter (HOSPITAL_COMMUNITY): Payer: Self-pay | Admitting: Vascular Surgery

## 2015-09-06 DIAGNOSIS — E440 Moderate protein-calorie malnutrition: Secondary | ICD-10-CM | POA: Insufficient documentation

## 2015-09-06 LAB — CBC
HCT: 30.6 % — ABNORMAL LOW (ref 36.0–46.0)
Hemoglobin: 9.9 g/dL — ABNORMAL LOW (ref 12.0–15.0)
MCH: 26.8 pg (ref 26.0–34.0)
MCHC: 32.4 g/dL (ref 30.0–36.0)
MCV: 82.7 fL (ref 78.0–100.0)
PLATELETS: 171 10*3/uL (ref 150–400)
RBC: 3.7 MIL/uL — AB (ref 3.87–5.11)
RDW: 15.9 % — AB (ref 11.5–15.5)
WBC: 25.8 10*3/uL — ABNORMAL HIGH (ref 4.0–10.5)

## 2015-09-06 LAB — BASIC METABOLIC PANEL
ANION GAP: 10 (ref 5–15)
BUN: 6 mg/dL (ref 6–20)
CALCIUM: 7.4 mg/dL — AB (ref 8.9–10.3)
CO2: 18 mmol/L — ABNORMAL LOW (ref 22–32)
Chloride: 108 mmol/L (ref 101–111)
Creatinine, Ser: 1.02 mg/dL — ABNORMAL HIGH (ref 0.44–1.00)
Glucose, Bld: 106 mg/dL — ABNORMAL HIGH (ref 65–99)
POTASSIUM: 4.1 mmol/L (ref 3.5–5.1)
SODIUM: 136 mmol/L (ref 135–145)

## 2015-09-06 LAB — GRAM STAIN

## 2015-09-06 LAB — POCT I-STAT 4, (NA,K, GLUC, HGB,HCT)
Glucose, Bld: 130 mg/dL — ABNORMAL HIGH (ref 65–99)
HEMATOCRIT: 32 % — AB (ref 36.0–46.0)
HEMOGLOBIN: 10.9 g/dL — AB (ref 12.0–15.0)
Potassium: 3.7 mmol/L (ref 3.5–5.1)
Sodium: 139 mmol/L (ref 135–145)

## 2015-09-06 LAB — APTT: APTT: 87 s — AB (ref 24–37)

## 2015-09-06 LAB — OCCULT BLOOD X 1 CARD TO LAB, STOOL: Fecal Occult Bld: NEGATIVE

## 2015-09-06 LAB — PROTIME-INR
INR: 1.38 (ref 0.00–1.49)
PROTHROMBIN TIME: 17.1 s — AB (ref 11.6–15.2)

## 2015-09-06 LAB — BLOOD PRODUCT ORDER (VERBAL) VERIFICATION

## 2015-09-06 LAB — HEPARIN LEVEL (UNFRACTIONATED): Heparin Unfractionated: 0.25 IU/mL — ABNORMAL LOW (ref 0.30–0.70)

## 2015-09-06 LAB — MRSA PCR SCREENING: MRSA by PCR: NEGATIVE

## 2015-09-06 MED ORDER — OXYCODONE HCL 5 MG PO TABS
5.0000 mg | ORAL_TABLET | ORAL | Status: DC | PRN
  Administered 2015-09-06 – 2015-09-07 (×2): 5 mg via ORAL
  Filled 2015-09-06 (×2): qty 1

## 2015-09-06 MED ORDER — SODIUM CHLORIDE 0.9 % IV SOLN
500.0000 mL | Freq: Three times a day (TID) | INTRAVENOUS | Status: AC | PRN
  Administered 2015-09-07: 500 mL via INTRAVENOUS

## 2015-09-06 NOTE — Progress Notes (Signed)
[facility]  Patient Status: Active (receiving services up to time of hospitalization)  [facility] is providing the following services: RN  If patient discharges after hours, please call [phone].   Cathy Jones 09/06/2015, 11:20 AM

## 2015-09-06 NOTE — Care Management Note (Addendum)
Case Management Note  Patient Details  Name: Cathy Jones MRN: [id number] Date of Birth: [birth year]  Subjective/Objective:     Patient lives with spouse, she is s/p  Right to Left Bil fem fem bypass.  She has insurance for medications and transport home and PCP.  Patient may need rolling walker at discharge or other DME, await pt/ot eval, can't move toes or ankle on the right LE.  Patient is active with [facility] for [facility].   NCM will cont to follow for dc needs.    4/28- Per pt eval rec HHPT and rolling walker [facility] will need to be notified for this DME, ,  Mylei notified with [facility], patient will have HHRN/HHPT.  NCM will cont to follow for dc needs.                Action/Plan:   Expected Discharge Date:                  Expected Discharge Plan:  [place]  In-House Referral:     Discharge planning Services  CM Consult  Post Acute Care Choice:  Home Health Choice offered to:  Patient  DME Arranged:    DME Agency:     HH Arranged:  RN [place] Agency:  [facility]  Status of Service:  In process, will continue to follow  Medicare Important Message Given:    Date Medicare IM Given:    Medicare IM give by:    Date Additional Medicare IM Given:    Additional Medicare Important Message give by:     If discussed at [place] of Stay Meetings, dates discussed:    Additional Comments:  Zenon Mayo, RN 09/06/2015, 10:58 AM

## 2015-09-06 NOTE — Progress Notes (Addendum)
[facility]   Subjective  - Doing ok, can't move toes or ankle on the right LE.  Pain well controlled over all.   Objective 93/54 87 99.9 F (37.7 C) (Oral) 19 97%  Intake/Output Summary (Last 24 hours) at 09/06/15 0734 Last data filed at 09/06/15 0500  Gross per 24 hour  Intake 3547.33 ml  Output   2455 ml  Net 1092.33 ml    Groin soft without hematoma Doppler DP/PT monophasic Heart RRR Lungs non labored breathing  Assessment/Planning: POD #1 THROMBECTOMY of left-to-right femoral-femoral bypass via right inguinal approach WBC 25.8 continue antibiotic vanc and zosyn Pending intraoperative culture Keep right groin clean and dry OK to transfer per primary provider Dr. Marcello Moores Foot drop right needs walking AFO Heparin 900 units per hour was started post-op, we will consult pharmacy for heparin dosing now.   Hold Eliquis for 1 more day for now thanks     Laurence Slate [facility] 09/06/2015 7:34 AM --  Laboratory Lab Results:  Recent Labs  09/05/15 0455 09/06/15 0107  WBC 7.8 25.8*  HGB 8.4* 9.9*  HCT 26.8* 30.6*  PLT 240 171   BMET  Recent Labs  09/06/15 0107  NA 136  K 4.1  CL 108  CO2 18*  GLUCOSE 106*  BUN 6  CREATININE 1.02*  CALCIUM 7.4*    COAG Lab Results  Component Value Date   INR 1.38 09/06/2015   INR 1.05 09/04/2015   INR 1.17 08/10/2015   No results found for: PTT  Agree with above assessment Right foot with much better perfusion with good Doppler flow in posterior tibial artery Patient has almost complete numbness of right foot. She had significant numbness prior to ischemic event yesterday but that is now worsened Also patient has foot drop with decreased motion of toes Calf and anterior compartment are both soft with no firmness or tenderness  Intraoperative Gram stain and cultures are pending Will continue patient on vancomycin and Zosyn Patient had gram-positive cocci from pelvic abscess  drained yesterday in radiology department Will defer to Dr. Marcello Moores regarding transfer to 2 [facility].

## 2015-09-06 NOTE — Progress Notes (Signed)
Pt called nurse to room regarding reddish drainage from ostomy, notified maureen collins pa and mentionned to her that pt's heparin gtt was just increased to 10.5cc/hr at 9am, new orders rec'd will continue to monitor.

## 2015-09-06 NOTE — Progress Notes (Addendum)
Dehydration  Subjective:   Pt s/p thrombectomy of occluded fem-fem bypass graft.  Still with some residual symptoms in her R foot.  Had bloody drainage from JP and ostomy this am.  On Hep gtt.  S/o transfusion.  Objective: Vital signs in last 24 hours: Temp:  [97.2 F (36.2 C)-100.6 F (38.1 C)] 100.6 F (38.1 C) (04/27 1158) Pulse Rate:  [82-106] 87 (04/27 1209) Resp:  [18-33] 18 (04/27 1209) BP: (80-118)/(40-70) 90/60 mmHg (04/27 1209) SpO2:  [94 %-100 %] 98 % (04/27 1209) Last BM Date: 09/04/15  Intake/Output from previous day: 04/26 0701 - 04/27 0700 In: 4081.2 [I.V.:3161.2; Blood:670; IV Piggyback:250] Out: [id number] [Urine:1680; Drains:25; Stool:300; Blood:450] Intake/Output this shift: Total I/O In: 1533.6 [P.O.:480; I.V.:953.6; IV Piggyback:100] Out: 620 [Drains:20; Stool:600]  General appearance: alert and cooperative GI: normal findings: soft, non-tender  JP: clot and ss fluid in drain  Lab Results:  Results for orders placed or performed during the hospital encounter of 09/02/15 (from the past 24 hour(s))  Type and screen     Status: None (Preliminary result)   Collection Time: 09/05/15  7:00 PM  Result Value Ref Range   ABO/RH(D) A POS    Antibody Screen NEG    Sample Expiration 09/08/2015    Unit Number [id number]    Blood Component Type RED CELLS,LR    Unit division 00    Status of Unit ALLOCATED    Transfusion Status OK TO TRANSFUSE    Crossmatch Result Compatible    Unit Number [id number]    Blood Component Type RED CELLS,LR    Unit division 00    Status of Unit ISSUED,FINAL    Transfusion Status OK TO TRANSFUSE    Crossmatch Result Compatible    Unit Number [id number]    Blood Component Type RED CELLS,LR    Unit division 00    Status of Unit ISSUED,FINAL    Transfusion Status OK TO TRANSFUSE    Crossmatch Result Compatible   I-STAT 4, (NA,K, GLUC, HGB,HCT)     Status: Abnormal   Collection Time: 09/05/15  7:04 PM  Result Value Ref Range    Sodium 139 135 - 145 mmol/L   Potassium 3.7 3.5 - 5.1 mmol/L   Glucose, Bld 130 (H) 65 - 99 mg/dL   HCT 32.0 (L) 36.0 - 46.0 %   Hemoglobin 10.9 (L) 12.0 - 15.0 g/dL  Gram stain     Status: None   Collection Time: 09/05/15  7:36 PM  Result Value Ref Range   Specimen Description WOUND RIGHT GROIN    Special Requests POF VANCOMYCIN AND ZOSYN    Gram Stain      DEGENERATED CELLULAR MATERIAL PRESENT NO ORGANISMS SEEN    Report Status 09/06/2015 FINAL   Glucose, capillary     Status: Abnormal   Collection Time: 09/05/15  8:56 PM  Result Value Ref Range   Glucose-Capillary 158 (H) 65 - 99 mg/dL  CBC     Status: Abnormal   Collection Time: 09/06/15  1:07 AM  Result Value Ref Range   WBC 25.8 (H) 4.0 - 10.5 K/uL   RBC 3.70 (L) 3.87 - 5.11 MIL/uL   Hemoglobin 9.9 (L) 12.0 - 15.0 g/dL   HCT 30.6 (L) 36.0 - 46.0 %   MCV 82.7 78.0 - 100.0 fL   MCH 26.8 26.0 - 34.0 pg   MCHC 32.4 30.0 - 36.0 g/dL   RDW 15.9 (H) 11.5 - 15.5 %   Platelets 171 150 - 400 K/uL  Basic metabolic panel     Status: Abnormal   Collection Time: 09/06/15  1:07 AM  Result Value Ref Range   Sodium 136 135 - 145 mmol/L   Potassium 4.1 3.5 - 5.1 mmol/L   Chloride 108 101 - 111 mmol/L   CO2 18 (L) 22 - 32 mmol/L   Glucose, Bld 106 (H) 65 - 99 mg/dL   BUN 6 6 - 20 mg/dL   Creatinine, Ser 1.02 (H) 0.44 - 1.00 mg/dL   Calcium 7.4 (L) 8.9 - 10.3 mg/dL   GFR calc non Af Amer >60 >60 mL/min   GFR calc Af Amer >60 >60 mL/min   Anion gap 10 5 - 15  Protime-INR     Status: Abnormal   Collection Time: 09/06/15  1:07 AM  Result Value Ref Range   Prothrombin Time 17.1 (H) 11.6 - 15.2 seconds   INR 1.38 0.00 - 1.49  APTT     Status: Abnormal   Collection Time: 09/06/15  1:07 AM  Result Value Ref Range   aPTT 87 (H) 24 - 37 seconds  Heparin level (unfractionated)     Status: Abnormal   Collection Time: 09/06/15  1:07 AM  Result Value Ref Range   Heparin Unfractionated 0.25 (L) 0.30 - 0.70 IU/mL  MRSA PCR Screening      Status: None   Collection Time: 09/06/15  6:35 AM  Result Value Ref Range   MRSA by PCR NEGATIVE NEGATIVE  Provider-confirm verbal Blood Bank order - RBC, Type & Screen; 3 Units; Order taken: 09/05/2015; 7:45 PM; Surgery 3 RBC ordered, 2 issued, 1 returned     Status: None   Collection Time: 09/06/15  9:00 AM  Result Value Ref Range   Blood product order confirm MD AUTHORIZATION REQUESTED   Occult blood card to lab, stool RN will collect     Status: None   Collection Time: 09/06/15 12:29 PM  Result Value Ref Range   Fecal Occult Bld NEGATIVE NEGATIVE     Studies/Results Radiology   MEDS, Scheduled . sodium chloride  10 mL/hr Intravenous Once  . atorvastatin  10 mg Oral q morning - 10a  . cholestyramine  4 g Oral Daily  . diphenoxylate-atropine  2 tablet Oral TID AC & HS  . docusate sodium  100 mg Oral Daily  . ertapenem  1 g Intravenous Q24H  . folic acid  1 mg Oral Daily  . loperamide  4 mg Oral TID AC & HS  . LORazepam  0.5 mg Oral q morning - 10a  . pantoprazole  40 mg Oral QHS  . polycarbophil  625 mg Oral BID  . sertraline  100 mg Oral q morning - 10a  . vitamin B-12  100 mcg Oral Daily     Assessment: Dehydration High output ileostomy  Plan: Barium enema shows anastomotic leak.  Pt diverted CT scan abdomen and pelvis shows pelvic abscess, s/p IR drain placement, On Invanz  Eliquis held.  On hep gtt.  Anticoagulation  Decrease IV fluids now, that UOP has picked up.  Ostomy output better over the last 24h.  Will continue to monitor now that she is back on a reg diet. BP's in 80s and 90's.  Will have RN give 548ml bolus as needed.  Keep in SDU until BPs are better.     LOS: 4 days    Rosario Adie, MD [facility], [place]   09/06/2015 2:13 PM

## 2015-09-06 NOTE — Evaluation (Signed)
Physical Therapy Evaluation Patient Details Name: Cathy Jones MRN: [id number] DOB: October 28, 1960 Today's Date: 09/06/2015   History of Present Illness  55 y.o. female admitted to [facility] on 09/02/15 due to dehydration with high output in ileostomy.  CT scan of abdomen/pelvis revealed pelvic abscess which was treated with aspiration and drainage on 09/05/15.  JP drain still in place in right glute.  Post placement of drain was complicated by sudden onset of right leg pain, decreased pulse and decreased motor function.  Vascular surgery was consulted and pt underwent emergent thrombectomy of left to right femoral-femoral bypass via right inguinal approach on 09/05/15.  Pt with significant PMHx of HTN, DOB, anemia, migraine, ARF, R illac artery occlusion, cervical CA, colon resection with dicerting ileostomy, PVD, and recent (07/3015) fem-fem bypass graft bil as well as thrombectomy to the right illiac artery with interoperative arteriogram.    Clinical Impression  Pt was able to walk a short distance with the use of RW and R walking PRAFO boot. She has very significant R foot drop and decreased sensation.  Pt would benefit from HHPT f/u to help pt progress from RW to cane and assist with getting a custom AFO ordered if no return to right foot muscle function.   PT to follow acutely for deficits listed below.     Follow Up Recommendations Home health PT;Supervision for mobility/OOB    Equipment Recommendations  Rolling walker with 5" wheels    Recommendations for Other Services   NA    Precautions / Restrictions Precautions Precautions: None Precaution Comments: JP drain to right buttocks, ostomy bag Required Braces or Orthoses: Other Brace/Splint Other Brace/Splint: has a walking PRAFO for right foot drop.  If this continues may need custom AFO as an outpatient.       Mobility  Bed Mobility               General bed mobility comments: pt OOB in chair  Transfers Overall transfer level:  Needs assistance Equipment used: Rolling walker (2 wheeled) Transfers: Sit to/from Stand Sit to Stand: Mod assist         General transfer comment: Mod assist to support trunk to get to standing and power up over weak and painful right leg.   Ambulation/Gait Ambulation/Gait assistance: Min assist;+2 safety/equipment Ambulation Distance (Feet): 25 Feet Assistive device: Rolling walker (2 wheeled) Gait Pattern/deviations: Step-to pattern;Antalgic;Trunk flexed     General Gait Details: Verbal cues for upright posture, safe use of RW.  Assist needed to help support trunk.  Antalgic gait pattern improved with increased gait distance.  Chair to follow for safety and to encourage increased gait distance         Balance Overall balance assessment: Needs assistance Sitting-balance support: Feet supported;No upper extremity supported Sitting balance-Leahy Scale: Good     Standing balance support: Bilateral upper extremity supported Standing balance-Leahy Scale: Poor                               Pertinent Vitals/Pain Pain Assessment: 0-10 Pain Score: 8  Pain Location: right groin Pain Descriptors / Indicators: Aching;Burning Pain Intervention(s): Limited activity within patient's tolerance;Monitored during session;Repositioned    Home Living Family/patient expects to be discharged to:: Private residence Living Arrangements: Spouse/significant other (pets) Available Help at Discharge: Family;Available PRN/intermittently (husband works days, parents can come by) Type of Home: House Home Access: Stairs to enter Entrance Stairs-Rails: None (columns to grab to on both  sides) Entrance Stairs-Number of Steps: 3 Home Layout: One level Home Equipment: None (uses what sounds like a plastic stool to sit in shower)      Prior Function Level of Independence: Independent         Comments: very recent sudden right leg weakness and dysfunction        Extremity/Trunk  Assessment   Upper Extremity Assessment: Defer to OT evaluation           Lower Extremity Assessment: RLE deficits/detail RLE Deficits / Details: right leg with decreased sensation from knee down and 0/5 DF/PF.      Cervical / Trunk Assessment: Normal  Communication   Communication: No difficulties  Cognition Arousal/Alertness: Awake/alert Behavior During Therapy: WFL for tasks assessed/performed Overall Cognitive Status: Within Functional Limits for tasks assessed                               Assessment/Plan    PT Assessment Patient needs continued PT services  PT Diagnosis Difficulty walking;Abnormality of gait;Generalized weakness;Acute pain   PT Problem List Decreased strength;Decreased range of motion;Decreased activity tolerance;Decreased balance;Decreased mobility;Decreased coordination;Decreased knowledge of use of DME;Impaired sensation;Impaired tone;Pain  PT Treatment Interventions DME instruction;Gait training;Stair training;Functional mobility training;Therapeutic activities;Balance training;Therapeutic exercise;Neuromuscular re-education;Patient/family education   PT Goals (Current goals can be found in the Care Plan section) Acute Rehab PT Goals Patient Stated Goal: to get better, have return of her right foot function PT Goal Formulation: With patient Time For Goal Achievement: 09/20/15 Potential to Achieve Goals: Good    Frequency Min 3X/week           End of Session   Activity Tolerance: Patient limited by pain Patient left: in chair;with call bell/phone within reach Nurse Communication: Mobility status         Time: [id number] PT Time Calculation (min) (ACUTE ONLY): 37 min   Charges:   PT Evaluation $PT Eval Moderate Complexity: 1 Procedure PT Treatments $Gait Training: 8-22 mins        Jonny Dearden B. [place], [place], DPT [phone]   09/06/2015, 3:58 PM

## 2015-09-06 NOTE — Progress Notes (Signed)
Patient ID: Cathy Jones, female   DOB: [birth year], 55 y.o.   MRN: [id number]    Referring Physician(s): Thomas,Alicia   Supervising Physician: Aletta Edouard  Patient Status: In-pt  Chief Complaint:   Pelvic abscess  Subjective:  Pt now at [facility] following transfer for emergent thrombectomy of occluded fem- fem BPG 4/26; symptoms began post TG drain placement on 4/26; currently feels a little better but still has some numbness in rt LE as well as some bloody drainage from both JP and ostomy  Allergies: Codeine  Medications: Prior to Admission medications   Medication Sig Start Date End Date Taking? Authorizing Provider  apixaban (ELIQUIS) 5 MG TABS tablet Take 1 tablet (5 mg total) by mouth 2 (two) times daily. 08/08/15  Yes Debbe Odea, MD  cholestyramine (QUESTRAN) 4 g packet Take 1 packet (4 g total) by mouth daily. 08/25/15  Yes Maryann Mikhail, DO  diphenoxylate-atropine (LOMOTIL) 2.5-0.025 MG tablet Take 2 tablets by mouth 4 (four) times daily -  before meals and at bedtime. 08/04/15  Yes Michael Boston, MD  ferrous sulfate 325 (65 FE) MG tablet Take 1 tablet (325 mg total) by mouth 2 (two) times daily with a meal. 08/04/15  Yes Michael Boston, MD  folic acid (FOLVITE) 1 MG tablet Take 1 tablet (1 mg total) by mouth daily. 08/04/15  Yes Debbe Odea, MD  loperamide (IMODIUM) 2 MG capsule Take 2 capsules (4 mg total) by mouth 4 (four) times daily -  before meals and at bedtime. 08/04/15  Yes Michael Boston, MD  LORazepam (ATIVAN) 0.5 MG tablet Take 0.5 mg by mouth every morning.   Yes Historical Provider, MD  ondansetron (ZOFRAN) 4 MG tablet Take 1 tablet (4 mg total) by mouth every 6 (six) hours as needed for nausea. 08/25/15  Yes Maryann Mikhail, DO  Opium 10 MG/ML (1%) TINC Take 1 mL by mouth 4 (four) times daily.   Yes Historical Provider, MD  oxyCODONE (OXY IR/ROXICODONE) 5 MG immediate release tablet Take 1 tablet (5 mg total) by mouth every 4 (four) hours as needed for moderate  pain. 08/11/15  Yes Ulyses Amor, PA-C  pantoprazole (PROTONIX) 40 MG tablet Take 1 tablet (40 mg total) by mouth at bedtime. 08/25/15  Yes Maryann Mikhail, DO  polycarbophil (FIBERCON) 625 MG tablet Take 1 tablet (625 mg total) by mouth 2 (two) times daily. 08/04/15  Yes Michael Boston, MD  sertraline (ZOLOFT) 100 MG tablet Take one tablet by mouth every morning. 02/19/15  Yes Historical Provider, MD  vitamin B-12 1000 MCG tablet Take 0.5 tablets (500 mcg total) by mouth daily. 08/04/15  Yes Debbe Odea, MD     Vital Signs: BP 90/60 mmHg  Pulse 87  Temp(Src) 100.6 F (38.1 C) (Oral)  Resp 18  Ht 5\' 2"  (1.575 m)  Wt 180 lb (81.647 kg)  BMI 32.91 kg/m2  SpO2 98%  Physical Exam awake/alert; TG drain intact, dressing dry, output 20 cc bloody fluid; cx's pend; ostomy with with some dark brown fluid in bag  Imaging: Ct Abdomen Pelvis W Contrast  09/03/2015  CLINICAL DATA:  Mucus tinged stools, height ileostomy output. EXAM: CT ABDOMEN AND PELVIS WITH CONTRAST TECHNIQUE: Multidetector CT imaging of the abdomen and pelvis was performed using the standard protocol following bolus administration of intravenous contrast. CONTRAST:  134mL ISOVUE-300 IOPAMIDOL (ISOVUE-300) INJECTION 61% COMPARISON:  07/30/2015. FINDINGS: Lower chest: Lung bases show a triangular-shaped subpleural 5 mm nodule in the right middle lobe, unchanged from 06/13/2014. Heart size normal.  No pericardial or pleural effusion. Hepatobiliary: Liver measures 20.5 cm. No focal lesion. Gallbladder is unremarkable. No biliary ductal dilatation. Pancreas: Negative. Spleen: Negative. Adrenals/Urinary Tract: Adrenal glands and kidneys are unremarkable. Minimal right ureteral dilatation. Left ureter is decompressed. Bladder is unremarkable. Stomach/Bowel: Stomach and majority of the small bowel are unremarkable. Right mid abdominal ileostomy. Postoperative changes in the distal ileum as well as rectosigmoid colon. There may be a rectal wall  thickening. Vascular/Lymphatic: Atherosclerotic calcification of the arterial vasculature. Aortobifemoral bypass grafting. Porta hepatis lymph nodes measure up to 1.4 cm, stable. Retroperitoneal lymph nodes are not enlarged by CT size criteria. Inguinal lymph nodes measure up to 10 mm on the right, possibly reactive. Reproductive: Hysterectomy.  Ovaries are not well-visualized. Other: Elongated collection of fluid and air in the right anatomic pelvis measures 2.3 x 7.6 cm (series 2, image 60), unchanged when remeasured on 07/30/2015. It is situated in close proximity to the sigmoid anastomosis (cyst image 63). Internal air is new when compared with 07/30/2015. Musculoskeletal: No worrisome lytic or sclerotic lesions. IMPRESSION: 1. Collection of fluid and air in the right posterior anatomic pelvis. Air within is new and could be due to an abscess. Anastomotic leak is not excluded. 2. Question rectal wall thickening. 3. Hepatomegaly. Electronically Signed   By: Lorin Picket M.D.   On: 09/03/2015 12:25   Ct Image Guided Drainage By Percutaneous Catheter  09/05/2015  INDICATION: 55 year old female status post LAR with loop ileostomy and ureteral stent placement for distal colonic stricture following cervical cancer resection and radiation therapy. A fluid and gas collection is present in the deep anatomic pelvis adjacent to the rectum. Barium enema demonstrates an anastomotic leak. She presents for CT-guided drainage. EXAM: CT GUIDED DRAINAGE OF  ABSCESS MEDICATIONS: The patient is currently admitted to the hospital and receiving intravenous antibiotics. The antibiotics were administered within an appropriate time frame prior to the initiation of the procedure. ANESTHESIA/SEDATION: 2 mg IV Versed 100 mcg IV Fentanyl Moderate Sedation Time:  23 The patient was continuously monitored during the procedure by the interventional radiology nurse under my direct supervision. COMPLICATIONS: None immediate. TECHNIQUE:  Informed written consent was obtained from the patient after a thorough discussion of the procedural risks, benefits and alternatives. All questions were addressed. Maximal Sterile Barrier Technique was utilized including caps, mask, sterile gowns, sterile gloves, sterile drape, hand hygiene and skin antiseptic. A timeout was performed prior to the initiation of the procedure. PROCEDURE: The operative field was prepped with Chlorhexidine in a sterile fashion, and a sterile drape was applied covering the operative field. A sterile gown and sterile gloves were used for the procedure. Local anesthesia was provided with 1% Lidocaine. Using intermittent CT fluoroscopic guidance, an 18 gauge trocar needle was advanced via a right trans gluteal approach into the right perirectal fluid collection. An Amplatz wire was coiled in the collection. The needle was removed and the skin tract serially dilated to 10 [place]. A Cook 10 [place] all-purpose drainage catheter was then advanced over the wire and formed within the fluid collection. Aspiration yields 16 mL thick, purulent foul-smelling material. Axial CT imaging confirms excellent positioning of the drainage catheter. There is no evidence of complication. The drain was secured to the skin with 0 Prolene suture and an adhesive fixation device. The drain was connected to JP bulb suction. FINDINGS: Aspiration yields thick, purulent and foul smelling material. IMPRESSION: Successful placement of a 10 French percutaneous drainage catheter via a right trans gluteal approach. Aspiration yields thick, foul-smelling  purulent material. Signed, Criselda Peaches, MD [facility] Radiology Electronically Signed   By: Jacqulynn Cadet M.D.   On: 09/05/2015 15:14   Dg Colon W/water Sol Cm  09/03/2015  CLINICAL DATA:  55 year old female with history of low anterior resection and loop ileostomy. High output from the ileostomy. Evaluate  colorectal anastomosis for stricture or anastomotic leak. EXAM: COLON WITH WATER SOLUTION CONTRAST COMPARISON:  No priors. FINDINGS: Preprocedure KUB demonstrated numerous surgical clips throughout the pelvic sidewall bilaterally extending into the retroperitoneal region, presumably from prior lymph node dissection. A suture line in the mid anatomic pelvis was also noted. Nonobstructive bowel gas pattern. The patient was initially imaged in the LPO position, and upon infusion of water-soluble contrast via the rectum, the rectum and colon were opacified. This demonstrated some narrowing at the site of the anastomosis which appeared rather mild. At the time of the examination, no obvious extravasation was noted. However, upon further evaluation of the images at the work station, subtle areas of contrast extravasation are noted posteriorly and to the right side at the level of the anastomosis. IMPRESSION: 1. Study is positive for anastomotic leak. In addition, there is mild luminal narrowing at the rectosigmoid anastomosis. These results were called by telephone at the time of interpretation on 09/03/2015 at [id number] pm to Dr. Leighton Ruff, who verbally acknowledged these results. Electronically Signed   By: Vinnie Langton M.D.   On: 09/03/2015 15:19    Labs:  CBC:  Recent Labs  09/02/15 1205 09/03/15 0529 09/05/15 0455 09/05/15 1904 09/06/15 0107  WBC 15.7* 17.1* 7.8  --  25.8*  HGB 11.5* 10.4* 8.4* 10.9* 9.9*  HCT 35.0* 32.3* 26.8* 32.0* 30.6*  PLT 372 392 240  --  171    COAGS:  Recent Labs  08/09/15 0400 08/10/15 0101 08/23/15 0937 09/04/15 1053 09/04/15 2100 09/06/15 0107  INR 1.25 1.17  --  1.05  --  1.38  APTT 69*  --  52* 28 35 87*    BMP:  Recent Labs  08/25/15 0400 09/02/15 1205 09/03/15 0529 09/05/15 1904 09/06/15 0107  NA 135 129* 134* 139 136  K 4.0 3.9 4.1 3.7 4.1  CL 110 93* 102  --  108  CO2 19* 24 21*  --  18*  GLUCOSE 121* 118* 120* 130* 106*  BUN 15 18 15    --  6  CALCIUM 7.8* 9.3 9.1  --  7.4*  CREATININE 0.87 1.17* 0.94  --  1.02*  GFRNONAA >60 52* >60  --  >60  GFRAA >60 >60 >60  --  >60    LIVER FUNCTION TESTS:  Recent Labs  08/09/15 0400 08/10/15 0101 08/23/15 0321 08/24/15 0745  BILITOT 0.3 0.4 0.6 0.4  AST 10* 14* 13* 13*  ALT 9* 12* 12* 12*  ALKPHOS 124 137* 189* 158*  PROT 5.2* 5.5* 6.5 5.9*  ALBUMIN 2.4* 2.6* 3.3* 3.0*    Assessment and Plan: S/p drainage of pelvic abscess 4/26 (known anast leak- prior LAR/ostomy)  ; recent thrombectomy of occluded FFBPG; temp 100.6; WBC 25.8, hgb 9.9(10.9), plts 171k, creat 1.02; BP's am little soft- hydrate; monitor labs closely; check drain fluid final cx's; Dr. Laurence Ferrari updated- no signs of bowel injury during drain placement encountered; check f/u CT within week of drain placement or if clinical status worsens. Other plans as per vascular/CCS.    Electronically Signed: D. Lennette Bihari Allred 09/06/2015, 2:30 PM   I spent a total of 15 minutes at  the the patient's bedside AND on the patient's hospital floor or unit, greater than 50% of which was counseling/coordinating care for pelvic abscess drain

## 2015-09-06 NOTE — Progress Notes (Signed)
ANTICOAGULATION CONSULT NOTE - Initial Consult  Pharmacy Consult for Heparin Indication: aortic thrombus (while Eliquis on hold)  Allergies  Allergen Reactions  . Codeine Other (See Comments)    "hyper"    Patient Measurements: Height: 5\' 2"  (157.5 cm) Weight: 180 lb (81.647 kg) IBW/kg (Calculated) : 50.1  Vital Signs: Temp: 98.5 F (36.9 C) (04/27 0752) Temp Source: Oral (04/27 0340) BP: 91/51 mmHg (04/27 0819) Pulse Rate: 84 (04/27 0819)  Labs:  Recent Labs  09/04/15 1053 09/04/15 2100  09/05/15 0455 09/05/15 1904 09/06/15 0107  HGB  --   --   < > 8.4* 10.9* 9.9*  HCT  --   --   --  26.8* 32.0* 30.6*  PLT  --   --   --  240  --  171  APTT 28 35  --   --   --  87*  LABPROT 13.9  --   --   --   --  17.1*  INR 1.05  --   --   --   --  1.38  HEPARINUNFRC  --  <0.10*  --  0.13*  --  0.25*  CREATININE  --   --   --   --   --  1.02*  < > = values in this interval not displayed.  Estimated Creatinine Clearance: 63.1 mL/min (by C-G formula based on Cr of 1.02).  Assessment: 55 year old female continues on heparin while apixaban on hold. Heparin currently running at 900 units / hr with heparin level at 0.25 CBC stable  Goal of Therapy:  Heparin level 0.3-0.7 units/ml Monitor platelets by anticoagulation protocol: Yes   Plan:  Increase heparin to 1050 units / hr Follow up AM labs  Plans to restart apixban tomorrow  Thank you Anette Guarneri, PharmD [phone] 09/06/2015,10:30 AM

## 2015-09-07 ENCOUNTER — Other Ambulatory Visit: Payer: Self-pay | Admitting: *Deleted

## 2015-09-07 DIAGNOSIS — I739 Peripheral vascular disease, unspecified: Secondary | ICD-10-CM

## 2015-09-07 LAB — TYPE AND SCREEN
ABO/RH(D): A POS
Antibody Screen: NEGATIVE
UNIT DIVISION: 0
UNIT DIVISION: 0
Unit division: 0

## 2015-09-07 LAB — CBC
HCT: 27.6 % — ABNORMAL LOW (ref 36.0–46.0)
HEMOGLOBIN: 8.7 g/dL — AB (ref 12.0–15.0)
MCH: 25.7 pg — AB (ref 26.0–34.0)
MCHC: 31.5 g/dL (ref 30.0–36.0)
MCV: 81.4 fL (ref 78.0–100.0)
PLATELETS: 175 10*3/uL (ref 150–400)
RBC: 3.39 MIL/uL — AB (ref 3.87–5.11)
RDW: 16.4 % — ABNORMAL HIGH (ref 11.5–15.5)
WBC: 10.7 10*3/uL — AB (ref 4.0–10.5)

## 2015-09-07 LAB — BASIC METABOLIC PANEL
ANION GAP: 9 (ref 5–15)
BUN: 8 mg/dL (ref 6–20)
CALCIUM: 7.3 mg/dL — AB (ref 8.9–10.3)
CO2: 20 mmol/L — AB (ref 22–32)
CREATININE: 0.82 mg/dL (ref 0.44–1.00)
Chloride: 106 mmol/L (ref 101–111)
GFR calc non Af Amer: >60 mL/min (ref >60)
Glucose, Bld: 103 mg/dL — ABNORMAL HIGH (ref 65–99)
Potassium: 3.4 mmol/L — ABNORMAL LOW (ref 3.5–5.1)
SODIUM: 135 mmol/L (ref 135–145)

## 2015-09-07 LAB — APTT: APTT: 59 s — AB (ref 24–37)

## 2015-09-07 LAB — HEPARIN LEVEL (UNFRACTIONATED): Heparin Unfractionated: <0.1 IU/mL — ABNORMAL LOW (ref 0.30–0.70)

## 2015-09-07 MED ORDER — APIXABAN 5 MG PO TABS
5.0000 mg | ORAL_TABLET | Freq: Two times a day (BID) | ORAL | Status: DC
  Administered 2015-09-07 – 2015-09-08 (×3): 5 mg via ORAL
  Filled 2015-09-07 (×3): qty 1

## 2015-09-07 MED ORDER — OPIUM 10 MG/ML (1%) PO TINC: 1.0000 mL | Freq: Four times a day (QID) | ORAL | Status: DC

## 2015-09-07 NOTE — Progress Notes (Signed)
Dehydration  Subjective:   Pt s/p thrombectomy of occluded fem-fem bypass graft.  Still with some residual symptoms in her R foot, but improved.  Having some bloody drainage from JP but no more from the ostomy.  Objective: Vital signs in last 24 hours: Temp:  [97.6 F (36.4 C)-100.1 F (37.8 C)] 97.7 F (36.5 C) (04/28 1200) Pulse Rate:  [68-90] 68 (04/28 0815) Resp:  [14-21] 14 (04/28 0815) BP: (90-108)/(45-57) 102/56 mmHg (04/28 0815) SpO2:  [90 %-99 %] 96 % (04/28 0815) Last BM Date: 09/07/15  Intake/Output from previous day: 04/27 0701 - 04/28 0700 In: 3359.4 [P.O.:720; I.V.:2529.4; IV Piggyback:100] Out: 3800 [Urine:1725; Drains:75; [id number] Intake/Output this shift: Total I/O In: 129.8 [I.V.:129.8] Out: -   General appearance: alert and cooperative GI: normal findings: soft, non-tender  JP: clot and ss fluid in drain  Lab Results:  Results for orders placed or performed during the hospital encounter of 09/02/15 (from the past 24 hour(s))  CBC     Status: Abnormal   Collection Time: 09/07/15  4:54 AM  Result Value Ref Range   WBC 10.7 (H) 4.0 - 10.5 K/uL   RBC 3.39 (L) 3.87 - 5.11 MIL/uL   Hemoglobin 8.7 (L) 12.0 - 15.0 g/dL   HCT 27.6 (L) 36.0 - 46.0 %   MCV 81.4 78.0 - 100.0 fL   MCH 25.7 (L) 26.0 - 34.0 pg   MCHC 31.5 30.0 - 36.0 g/dL   RDW 16.4 (H) 11.5 - 15.5 %   Platelets 175 150 - 400 K/uL  APTT     Status: Abnormal   Collection Time: 09/07/15  4:54 AM  Result Value Ref Range   aPTT 59 (H) 24 - 37 seconds  Heparin level (unfractionated)     Status: Abnormal   Collection Time: 09/07/15  4:54 AM  Result Value Ref Range   Heparin Unfractionated <0.10 (L) 0.30 - 0.70 IU/mL  Basic metabolic panel     Status: Abnormal   Collection Time: 09/07/15  4:54 AM  Result Value Ref Range   Sodium 135 135 - 145 mmol/L   Potassium 3.4 (L) 3.5 - 5.1 mmol/L   Chloride 106 101 - 111 mmol/L   CO2 20 (L) 22 - 32 mmol/L   Glucose, Bld 103 (H) 65 - 99 mg/dL   BUN 8 6  - 20 mg/dL   Creatinine, Ser 0.82 0.44 - 1.00 mg/dL   Calcium 7.3 (L) 8.9 - 10.3 mg/dL   GFR calc non Af Amer >60 >60 mL/min   GFR calc Af Amer >60 >60 mL/min   Anion gap 9 5 - 15     Studies/Results Radiology   MEDS, Scheduled . apixaban  5 mg Oral BID  . atorvastatin  10 mg Oral q morning - 10a  . diphenoxylate-atropine  2 tablet Oral TID AC & HS  . ertapenem  1 g Intravenous Q24H  . folic acid  1 mg Oral Daily  . loperamide  4 mg Oral TID AC & HS  . LORazepam  0.5 mg Oral q morning - 10a  . Opium  1 mL Oral Q6H  . pantoprazole  40 mg Oral QHS  . sertraline  100 mg Oral q morning - 10a  . vitamin B-12  100 mcg Oral Daily     Assessment: Dehydration High output ileostomy  Plan: Barium enema shows anastomotic leak.  Pt diverted CT scan abdomen and pelvis shows pelvic abscess, s/p IR drain placement, On Invanz.  Awaiting culture results Eliquis  restarted.  D/C hep gtt.   Decrease IV fluids now that UOP has picked up.  Ostomy output still high.  Will continue to monitor now that she is back on a reg diet. BP's mostly in the 90's.  Will transfer to floor.      LOS: 5 days    Rosario Adie, MD [facility], [place] [phone]   09/07/2015 2:02 PM

## 2015-09-07 NOTE — Progress Notes (Signed)
ANTICOAGULATION CONSULT NOTE - Initial Consult  Pharmacy Consult for Heparin>>Apixaban Indication: Aortic thrombus, s/p fem-fem bypass thrombectomy   Allergies  Allergen Reactions  . Codeine Other (See Comments)    "hyper"    Patient Measurements: Height: 5\' 2"  (157.5 cm) Weight: 180 lb (81.647 kg) IBW/kg (Calculated) : 50.1  Vital Signs: Temp: 99.2 F (37.3 C) (04/28 0356) Temp Source: Oral (04/28 0356) BP: 90/52 mmHg (04/28 0356) Pulse Rate: 84 (04/28 0356)  Labs:  Recent Labs  09/04/15 1053  09/04/15 2100  09/05/15 0455 09/05/15 1904 09/06/15 0107 09/07/15 0454  HGB  --   --   --   < > 8.4* 10.9* 9.9* 8.7*  HCT  --   --   --   < > 26.8* 32.0* 30.6* 27.6*  PLT  --   --   --   --  240  --  171 175  APTT 28  --  35  --   --   --  87* 59*  LABPROT 13.9  --   --   --   --   --  17.1*  --   INR 1.05  --   --   --   --   --  1.38  --   HEPARINUNFRC  --   < > <0.10*  --  0.13*  --  0.25* <0.10*  CREATININE  --   --   --   --   --   --  1.02* 0.82  < > = values in this interval not displayed.  Estimated Creatinine Clearance: 78.5 mL/min (by C-G formula based on Cr of 0.82).   Medical History: Past Medical History  Diagnosis Date  . Hypertension   . Hyperlipidemia   . Asthma     pt brought her proair inhaler 06-06-14  . Colon polyp     hyperplastic  . 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 Colonic obstruction ([place])   . Depression   . History of hiatal hernia   . Family history of adverse reaction to anesthesia     pts mother experiences nausea and vomiting   . Shortness of breath dyspnea     seasonal   . Anemia   . GERD (gastroesophageal reflux disease)   . Migraine     "q couple years maybe" (08/07/2015)  . Anxiety   . Acute renal failure (ARF) ([place]) 08/07/2015  . 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 Cervical cancer ([facility])    Assessment: POD #2 thrombectomy, re-starting PTA Apixaban, Hgb 8.7  Goal of Therapy:  Monitor platelets by anticoagulation  protocol: Yes   Plan:  -Stop heparin drip  -Start Apixaban 5 mg BID -Minimum q72h CBC while inpatient -Monitor for bleeding  Narda Bonds 09/07/2015,7:44 AM

## 2015-09-07 NOTE — Discharge Instructions (Addendum)
DRAIN CARE:   You have a closed bulb drain to help you heal.    A bulb drain is a small, plastic reservoir which creates a gentle suction. It is used to remove excess fluid from a surgical wound. The color and amount of fluid will vary. Immediately after surgery, the fluid is bright red. It may gradually change to a yellow color. When the amount decreases to about 1 or 2 tablespoons (15 to 30 cc) per 24 hours, your caregiver will usually remove it.  JP Care  The Jackson-Pratt drainage system has flexible tubing attached to a soft, plastic bulb with a stopper. The drainage end of the tubing, which is flat and white, goes into your body through a small opening near your incision (surgical cut). A stitch holds the drainage end in place. The rest of the tube is outside your body, attached to the bulb. When the bulb is compressed with the stopper in place, it creates a vacuum. This causes a constant gentle suction, which helps draw out fluid that collects under your incision. The bulb should be compressed at all times, except when you are emptying the drainage.  How long you will have your Jackson-Pratt depends on your surgery and the amount of fluid is draining. This is different for everyone. The Jackson-Pratt is usually removed when the drainage is 30 mL or less over 24 hours. To keep track of how much drainage youre having, you will record the amount in a drainage log. Its important to bring the log with you to your follow-up appointments.  Caring for Your Jackson-Pratt at Home In order to care for your Jackson-Pratt at home, you or your caregiver will do the following:  Empty the drain once a day and record the color and amount of drainage  Care for the area where the tubing enters your skin by washing with soap and water.  Milk the tubing to help move clots into the bulb.  Do this before you empty and measure your drainage. Look in the mirror at the tubing. This will help you see where your  hands need to be. Pinch the tubing close to where it goes into your skin between your thumb and forefinger. With the thumb and forefinger of your other hand, pinch the tubing right below your other fingers. Keep your fingers pinched and slide them down the tubing, pushing any clots down toward the bulb. You may want to use alcohol swabs to help you slide your fingers down the tubing. Repeat steps 3 and 4 as necessary to push clots from the tubing into the bulb. If you are not able to move a clot into the bulb, call your doctors office. The fluid may leak around the insertion site if a clot is blocking the drainage flow. If there is fluid in the bulb and no leakage at the insertion site, the drain is working.  How to Empty Your Jackson-Pratt and Record the Drainage You will need to empty your Jackson-Pratt every day  Gather the following supplies:  Measuring container your nurse gave you Jackson-Pratt Drainage Record  Pen or pencil  Instructions Clean an area to work on. Clean your hands thoroughly. Unplug the stopper on top of your Jackson-Pratt. This will cause the bulb to expand. Do not touch the inside of the stopper or the inner area of the opening on the bulb. Turn your Jackson-Pratt upside down, gently squeeze the bulb, and pour the drainage into the measuring container. Turn your Jackson-Pratt right  side up. Squeeze the bulb until your fingers feel the palm of your hand. Keep squeezing the bulb while you replug the stopper. Make sure the bulb stays fully compressed to ensure constant, gentle suction.    Check the amount and color of drainage in the measuring container. The first couple days after surgery the fluid may be dark red. This is normal. As you heal the fluid may look pink or pale yellow. Record this amount and the color of drainage on your Jackson-Pratt Drainage Record. Flush the drainage down the toilet and rinse the measuring container with water.  Caring for the  Insertion Site Once you have emptied the drainage, clean your hands again. Check the area around the insertion site. Look for tenderness, swelling, or pus. If you have any of these, or if you have a temperature of 101 F (38.3 C) or higher, you may have an infection. Call your doctors office.  Sometimes, the drain causes redness the size of a dime at your insertion site. This is normal. Your healthcare provider will tell you if you should place a bandage over the insertion site.      DAILY CARE  Keep the bulb compressed at all times, except while emptying it. The compression creates suction.   Keep sites where the tubes enter the skin dry and covered with a light bandage (dressing).   Tape the tubes to your skin, 1 to 2 inches below the insertion sites, to keep from pulling on your stitches. Tubes are stitched in place and will not slip out.   Pin the bulb to your shirt (not to your pants) with a safety pin.   For the first few days after surgery, there usually is more fluid in the bulb. Empty the bulb whenever it becomes half full because the bulb does not create enough suction if it is too full. Include this amount in your 24 hour totals.   When the amount of drainage decreases, empty the bulb at the same time every day. Write down the amounts and the 24 hour totals. Your caregiver will want to know them. This helps your caregiver know when the tubes can be removed.   (We anticipate removing the drain in 1-3 weeks, depending on when the output is <22m a day for 2+ days)  If there is drainage around the tube sites, change dressings and keep the area dry. If you see a clot in the tube, leave it alone. However, if the tube does not appear to be draining, let your caregiver know.  TO EMPTY THE BULB  Open the stopper to release suction.   Holding the stopper out of the way, pour drainage into the measuring cup that was sent home with you.   Measure and write down the amount. If there  are 2 bulbs, note the amount of drainage from bulb 1 or bulb 2 and keep the totals separate. Your caregiver will want to know which tube is draining more.   Compress the bulb by folding it in half.   Replace the stopper.   Check the tape that holds the tube to your skin, and pin the bulb to your shirt.  SEEK MEDICAL CARE IF:  The drainage develops a bad odor.   You have an oral temperature above 102 F (38.9 C).   The amount of drainage from your wound suddenly increases or decreases.   You accidentally pull out your drain.   You have any other questions or concerns.  MAKE  SURE YOU:   Understand these instructions.   Will watch your condition.   Will get help right away if you are not doing well or get worse.     Call our office if you have any questions about your drain. [place] heard that people get along just fine with only one of their eyes, or one of their lungs, or one of their kidneys. But you also know that you have only one intestine and only one bladder, and that leaves you feeling awfully empty, both physically and emotionally: You think no other people go around without part of their intestine with the ends of their intestines sticking out through their abdominal walls.   YOU ARE NOT ALONE.  There are nearly three quarters of a million people in the [place] who have an ostomy; people who have had surgery to remove all or part of their colons or bladders.   There is even a national association, the [place] [employer] [place] with over 350 local affiliated support groups that are organized by volunteers who provide peer support and counseling. [place] has a toll free telephone num-ber, (903)790-5053 and an educational, interactive website, www.ostomy.org   An ostomy is an opening in the belly (abdominal wall) made by surgery. Ostomates are people who have had this procedure. The opening (stoma) allows the kidney or bowel  to discharge waste. An external pouch covers the stoma to collect waste. Pouches are are a simple bag and are odor free. Different companies have disposable or reusable pouches to fit one's lifestyle. An ostomy can either be temporary or permanent.   THERE ARE THREE MAIN TYPES OF OSTOMIES  Colostomy. A colostomy is a surgically created opening in the large intestine (colon).  Ileostomy. An ileostomy is a surgically created opening in the small intestine.  Urostomy. A urostomy is a surgically created opening to divert urine away from the bladder.  OSTOMY Care  The following guidelines will make care of your colostomy easier. Keep this information close by for quick reference.  Helpful DIET hints Eat a well-balanced diet including vegetables and fresh fruits. Eat on a regular schedule. Drink at least 6 to 8 glasses of fluids daily. Eat slowly in a relaxed atmosphere. Chew your food thoroughly. Avoid chewing gum, smoking, and drinking from a straw. This will help decrease the amount of air you swallow, which may help reduce gas. Eating yogurt or drinking buttermilk may help reduce gas.  To control gas at night, do not eat after 8 p.m. This will give your bowel time to quiet down before you go to bed.  If gas is a problem, you can purchase Beano. Sprinkle Beano on the first bite of food before eating to reduce gas. It has no flavor and should not change the taste of your food. You can buy Beano over the counter at your local drugstore.  Foods like fish, onions, garlic, broccoli, asparagus, and cabbage produce odor. Although your pouch is odor-proof, if you eat these foods you may notice a stronger odor when emptying your pouch. If this is a concern, you may want to limit these foods in your diet.  If you have an ileostomy, you will have chronic diarrhea & need to drink more liquids to avoid getting dehydrated.  Consider antidiarrheal medicine like imodium (loperamide) or Lomotil to help slow  down bowel movements / diarrhea into your ileostomy bag.  GETTING TO GOOD BOWEL HEALTH WITH AN ILEOSTOMY .  Irregular bowel habits such as constipation and diarrhea can lead to many problems over time.  The goal: 3-6 small BOWEL MOVEMENTS A DAY!  To have soft, regular bowel movements:   Drink plenty of fluids, consider 4-6 tall glasses of water a day.    Controlling diarrheA  o Switch to liquids and simpler foods for a few days to avoid stressing your intestines further. o Avoid dairy products (especially milk & ice cream) for a short time.  The intestines often can lose the ability to digest lactose when stressed. o Avoid foods that cause gassiness or bloating.  Typical foods include beans and other legumes, cabbage, broccoli, and dairy foods.  Every person has some sensitivity to other foods, so listen to our body and avoid those foods that trigger problems for you. o Adding fiber (Citrucel, Metamucil, psyllium, Miralax) gradually can help thicken stools by absorbing excess fluid and retrain the intestines to act more normally.  Slowly increase the dose over a few weeks.  Too much fiber too soon can backfire and cause cramping & bloating. o Probiotics (such as active yogurt, Align, etc) may help repopulate the intestines and colon with normal bacteria and calm down a sensitive digestive tract.  Most studies show it to be of mild help, though, and such products can be costly. o Medicines: - Bismuth subsalicylate (ex. Kayopectate, Pepto Bismol) every 30 minutes for up to 6 doses can help control diarrhea.  Avoid if pregnant. - Loperamide (Immodium) can slow down diarrhea.  Start with two tablets ('4mg'$  total) first and then try one tablet every 6 hours.  Avoid if you are having fevers or severe pain.  If you are not better or start feeling worse, stop all medicines and call your doctor for advice o Call your doctor if you are getting worse or not better.  Sometimes further testing (cultures,  endoscopy, X-ray studies, bloodwork, etc) may be needed to help diagnose and treat the cause of the diarrhea.  TROUBLESHOOTING IRREGULAR BOWELS 1) Avoid extremes of bowel movements (no bad constipation/diarrhea) 2) Miralax 17gm mixed in 8oz. water or juice-daily. May use twice a day as needed  3) Gas-x,Phazyme, etc. as needed for gas & bloating.  4) Soft,bland diet. No spicy,greasy,fried foods.  5) Prilosec (omeprazole) over-the-counter as needed  6) May hold gluten/wheat products from diet to see if symptoms improve.  7)  May try probiotics (Align, Activa, etc) to help calm the bowels down 7) If symptoms become worse call back immediately.   Applying the pouching system To apply your pouch, follow these steps:  Place all your equipment close at hand before removing your pouch.  Wash your hands.  Stand or sit in front of a mirror. Use the position that works best for you. Remember that you must keep the skin around the stoma wrinkle-free for a good seal.  Gently remove the used pouch (1-piece system) or the pouch and old wafer (2-piece system). Empty the pouch into the toilet. Save the closure clip to use again.  Wash the stoma itself and the skin around the stoma. Your stoma may bleed a little when being washed. This is normal. Rinse and pat dry. You may use a wash cloth or soft paper towels (like Bounty), mild soap (like Dial, Safeguard, or [place]), and water. Avoid soaps that contain perfumes or lotions.  For a new pouch (1-piece system) or a new wafer (2-piece system), measure your stoma using the stoma guide in each box of supplies.  Trace  the shape of your stoma onto the back of the new pouch or the back of the new wafer. Cut out the opening. Remove the paper backing and set it aside.  Optional: Apply a skin barrier powder to surrounding skin if it is irritated (bare or weeping), and dust off the excess. Optional: Apply a skin-prep wipe (such as Skin Prep or All-Kare) to the skin  around the stoma, and let it dry. Do not apply this solution if the skin is irritated (red, tender, or broken) or if you have shaved around the stoma. Optional: Apply a skin barrier paste (such as Stomahesive, Coloplast, or Premium) around the opening cut in the back of the pouch or wafer. Allow it to dry for 30 to 60 seconds.  Hold the pouch (1-piece system) or wafer (2-piece system) with the sticky side toward your body. Make sure the skin around the stoma is wrinkle-free. Center the opening on the stoma, then press firmly to your abdomen (Fig. 4). Look in the mirror to check if you are placing the pouch, or wafer, in the right position. For a 2-piece system, snap the pouch onto the wafer. Make sure it snaps into place securely.  Place your hand over the stoma and the pouch or wafer for about 30 seconds. The heat from your hand can help the pouch or wafer stick to your skin.  Add deodorant (such as Super Banish or Nullo) to your pouch. Other options include food extracts such as vanilla oil and peppermint extract. Add about 10 drops of the deodorant to the pouch. Then apply the closure clamp. Note: Do not use toxic  chemicals or commercial cleaning agents in your pouch. These substances may harm the stoma.  Optional: For extra seal, apply tape to all 4 sides around the pouch or wafer, as if you were framing a picture. You may use any brand of medical adhesive tape. Change your pouch every 5 to 7 days. Change it immediately if a leak occurs.  Wash your hands afterwards.  If you are wearing a 2-piece system, you may use 2 new pouches per week and alternate them. Rinse the pouch with mild soap and warm water and hang it to dry for the next day. Apply the fresh pouch. Alternate the 2 pouches like this for a week. After a week, change the wafer and begin with 2 new pouches. Place the old pouches in a plastic bag, and put them in the trash.    Tips for colostomy care  Applying Your Pouch You may  stand or sit to apply your pouch.  Keep the skin where you apply the pouch wrinkle-free. If the skin around the pouch is wrinkled, the seal may break when your skin stretches.  If hair grows close to your stoma, you may trim off the hair with scissors, an electric razor, or a safety razor.  Always have a mirror nearby so you can get a better view of your stoma.  When you apply a new pouch, write the date on the adhesive tape. This will remind you of when you last changed your pouch.  Changing Your Pouch The best time to change your pouch is in the morning, before eating or drinking anything. Your stoma can function at any time, but it will function more after eating or drinking.  Emptying Your Pouch Empty your pouch when it is one-third full (of urine, stool, and/or gas). If you wait until your pouch is fuller than this, it will be  more difficult to empty and more noticeable. When you empty your pouch, either put toilet paper in the toilet bowl first, or flush the toilet while you empty the pouch. This will reduce splashing. You can empty the pouch between your legs or to one side while sitting, or while standing or stooping. If you have a 2-piece system, you can snap off the pouch to empty it. Remember that your stoma may function during this time. If you wish to rinse your pouch after you empty it, a [place] baster can be helpful. When using a baster, squirt water up into the pouch through the opening at the bottom. With a 2-piece system, you can snap off the pouch to rinse it. After rinsing  your pouch, empty it into the toilet. When rinsing your pouch at home, put a few granules of Dreft soap in the rinse water. This helps lubricate and freshen your pouch. The inside of your pouch can be sprayed with non-stick cooking oil (Pam spray). This may help reduce stool sticking to the inside of the pouch.  Bathing You may shower or bathe with your pouch on or off. Remember that your stoma may  function during this time.  The materials you use to wash your stoma and the skin around it should be clean, but they do not need to be sterile.  Wearing Your Pouch During hot weather, or if you perspire a lot in general, wear a cover over your pouch. This may prevent a rash on your skin under the pouch. Pouch covers are sold at ostomy supply stores. Wear the pouch inside your underwear for better support. Watch your weight. Any gain or loss of 10 to 15 pounds or more can change the way your pouch fits.  Going Away From Home A collapsible cup (like those that come in travel kits) or a soft plastic squirt bottle with a pull-up top (like a travel bottle for shampoo) can be used for rinsing your pouch when you are away from home. Tilt the opening of the pouch at an upward angle when using a cup to rinse.  Carry wet wipes or extra tissues to use in public bathrooms.  Carry an extra pouching system with you at all times.  Never keep ostomy supplies in the glove compartment of your car. Extreme heat or cold can damage the skin barriers and adhesive wafers on the pouch.  When you travel, carry your ostomy supplies with you at all times. Keep them within easy reach. Do not pack ostomy supplies in baggage that will be checked or otherwise separated from you, because your baggage might be lost. If youre traveling out of the country, it is helpful to have a letter stating that you are carrying ostomy supplies as a medical necessity.  If you need ostomy supplies while traveling, look in the yellow pages of the telephone book under Surgical Supplies. Or call the local ostomy organization to find out where supplies are available.  Do not let your ostomy supplies get low. Always order new pouches before you use the last one.  Reducing Odor Limit foods such as broccoli, cabbage, onions, fish, and garlic in your diet to help reduce odor. Each time you empty your pouch, carefully clean the opening of the  pouch, both inside and outside, with toilet paper. Rinse your pouch 1 or 2 times daily after you empty it (see directions for emptying your pouch and going away from home). Add deodorant (such as Super Banish or Nullo)  to your pouch. Use air deodorizers in your bathroom. Do not add aspirin to your pouch. Even though aspirin can help prevent odor, it could cause ulcers on your stoma.  When to call the doctor Call the doctor if you have any of the following symptoms: Purple, black, or white stoma Severe cramps lasting more than 6 hours Severe watery discharge from the stoma lasting more than 6 hours No output from the colostomy for 3 days Excessive bleeding from your stoma Swelling of your stoma to more than 1/2-inch larger than usual Pulling inward of your stoma below skin level Severe skin irritation or deep ulcers Bulging or other changes in your abdomen  When to call your ostomy nurse Call your ostomy/enterostomal therapy (ET) nurse if any of the following occurs: Frequent leaking of your pouching system Change in size or appearance of your stoma, causing discomfort or problems with your pouch Skin rash or rawness Weight gain or loss that causes problems with your pouch      FREQUENTLY ASKED QUESTIONS   Why havent you met any of these folks who have an ostomy?  Well, maybe you have! You just did not recognize them because an ostomy doesn't show. It can be kept secret if you wish. Why, maybe some of your best friends, office associates or neighbors have an ostomy ... you never can tell.   People facing ostomy surgery have many quality-of-life questions like:  Will you bulge? Smell? Make noises? Will you feel waste leaving your body? Will you be a captive of the toilet? Will you starve? Be a social outcast? Get/stay married? Have babies? Easily bathe, go swimming, bend over?  OK, lets look at what you can expect:   Will you bulge?  Remember, without part of the intestine or  bladder, and its contents, you should have a flatter tummy than before. You can expect to wear, with little exception, what you wore before surgery ... and this in-cludes tight clothing and bathing suits.   Will you smell?  Today, thanks to modern odor proof pouching systems, you can walk into an ostomy support group meeting and not smell anything that is foul or offensive. And, for those with an ileostomy or colostomy who are concerned about odor when emptying their pouch, there are in-pouch deodorants that can be used to eliminate any waste odors that may exist.   Will you make noises?  Everyone produces gas, especially if they are an air-swallower. But intestinal sounds that occur from time to time are no differ-ent than a gurgling tummy, and quite often your clothing will muffle any sounds.   Will you feel the waste discharges?  For those with a colostomy or ileostomy there might be a slight pressure when waste leaves your body, but understand that the intestines have no nerve endings, so there will be no unpleasant sensations. Those with a urostomy will probably be unaware of any kidney drainage.   Will you be a captive of the toilet?  Immediately post-op you will spend more time in the bathroom than you will after your body recovers from surgery. Every person is different, but on average those with an ileostomy or urostomy may empty their pouches 4 to 6 times a day; a little  less if you have a colostomy. The average wear time between pouch system changes is 3 to 5 days and the changing process should take less than 30 minutes.   Will I need to be on a special diet? Most people  return to their normal diet when they have recovered from surgery. Be sure to chew your food well, eat a well-balanced diet and drink plenty of fluids. If you experience problems with a certain food, wait a couple of weeks and try it again.  Will there be odor and noises? Pouching systems are designed to be odor-proof  or odor-resistant. There are deodorants that can be used in the pouch. Medications are also available to help reduce odor. Limit gas-producing foods and carbonated beverages. You will experience less gas and fewer noises as you heal from surgery.  How much time will it take to care for my ostomy? At first, you may spend a lot of time learning about your ostomy and how to take care of it. As you become more comfortable and skilled at changing the pouching system, it will take very little time to care for it.   Will I be able to return to work? People with ostomies can perform most jobs. As soon as you have healed from surgery, you should be able to return to work. Heavy lifting (more than 10 pounds) may be discouraged.   What about intimacy? Sexual relationships and intimacy are important and fulfilling aspects of your life. They should continue after ostomy surgery. Intimacy-related concerns should be discussed openly between you and your partner.   Can I wear regular clothing? You do not need to wear special clothing. Ostomy pouches are fairly flat and barely noticeable. Elastic undergarments will not hurt the stoma or prevent the ostomy from functioning.   Can I participate in sports? An ostomy should not limit your involvement in sports. Many people with ostomies are runners, skiers, swimmers or participate in other active lifestyles. Talk with your caregiver first before doing heavy physical activity.  Will you starve?  Not if you follow doctors orders at each stage of your post-op adjustment. There is no such thing as an ostomy diet. Some people with an ostomy will be able to eat and tolerate anything; others may find diffi-culty with some foods. Each person is an individual and must determine, by trial, what is best for them. A good practice for all is to drink plenty of water.   Will you be a social outcast?  Have you met anyone who has an ostomy and is a social outcast? Why should you  be the first? Only your attitude and self image will effect how you are treated. No confi-dent person is an Occupational psychologist.     PROFESSIONAL HELP  Resources are available if you need help or have questions about your ostomy.    Specially trained nurses called Wound, Ostomy Continence Nurses (WOCN) are available for consultation in most major medical centers.   Consider getting an ostomy consult with Cena Benton at [facility] to help troubleshoot stoma pouch fittings and other issues with your ostomy: [phone]   The [employer] Ostomy Association (UOA) is a group made up of many local chapters throughout the [place]. These local groups hold meetings and provide support to prospective and existing ostomates. They sponsor educational events and have qualified visitors to make personal or telephone visits. Contact the UOA for the chapter nearest you and for other educational publications.   More detailed information can be found in Colostomy Guide, a publication of the [employer] (UOA). Contact UOA at 1-(913)511-4652 or visit their web site at https://arellano.com/. The website contains links to other sites, suppliers and resources.   [employer] Secure Start Services:  Start  at the website to enlist for support.  Your Wound Ostomy (WOCN) nurse may have started this process. https://www.hollister.com/en/securestart  Secure Start services are designed to support people as they live their lives with an ostomy or neurogenic bladder. Enrolling is easy and at no cost to the patient. We realize that each person's needs and life journey are different. Through Secure Start services, we want to help people live their life, their way.  Ileostomy Home Guide An ileostomy is an opening for stool to leave your body when a medical condition prevents it from leaving through the usual opening (rectum). During a surgery, a piece of small intestine (ileum) is brought through a hole in the  abdominal wall. The new opening is called a stoma or ostomy. A bag or pouch fits over the stoma to catch stool and gas. Your stool may be liquid at first. Over time, it may become about the consistency of applesauce. CARING FOR YOUR STOMA Normally, the stoma looks a lot like the inside of your cheek: pink and moist. At first, it may be swollen, but this swelling will decrease within 6 weeks. Keep the skin around the stoma clean and dry. You can gently wash your stoma in the shower with a clean, soft washcloth. If you develop any skin irritation, your caregiver may give you a stoma powder or ointment to help heal the area. Do not use any products other than those specifically given to you by your caregiver.  Your stoma should not be uncomfortable. If you notice any stinging or burning, your pouch may be leaking, and the skin around your stoma may be coming into contact with stool. This can cause skin irritation. If you notice stinging, you should replace your pouch with a new one and discard the old one. OSTOMY POUCHES The pouch that fits over the ostomy can be made up of either 1 or 2 pieces. A one-piece pouch has a skin barrier piece and the pouch itself in one unit. A two-piece pouch has a skin barrier with a separate pouch that snaps on and off of the skin barrier. Either way, you should empty the pouch when it is only  to  full. Do not let more stool or gas build up. This could cause the pouch to leak. Some ostomy bags have a built-in gas release valve. Ostomy deodorizer (5 drops) can be put into the pouch to prevent odor. Some people use an ostomy lubricant to help the stool slide out of the bag more easily and completely.  EMPTYING YOUR OSTOMY POUCH You may get lessons on how to empty your pouch from a wound-ostomy nurse before you leave the hospital. Here are the basic steps:  Wash your hands with soap and water.  Sit far back on the toilet.  Put pieces of toilet paper into the toilet water.  This will prevent splashing as you empty the stool into the toilet bowl.  Unclip or unvelcro the tail end of the pouch.  Unroll the tail and empty stool into the toilet.  Clean the tail with toilet paper.  Reroll the tail, and clip or velcro it closed.  Wash your hands again. CHANGING YOUR OSTOMY POUCH Change your ostomy pouch about every 3 to 4 days for the first 6 weeks, then every 5 to 7 days. Always change the bag sooner if you begin to notice any discomfort or irritation of the skin around the stoma. When possible, plan to change your ostomy pouching system before eating and  drinking because this will lessen the chance of stool coming out during the change. A wound-ostomy nurse may teach you how to change your pouch before you leave the hospital. Here are the basic steps:  Lay out your supplies.  Wash your hands with soap and water.  Carefully remove the old pouch.  Wash the stoma and the skin around the stoma. Allow the area to dry. Men may be advised to shave any hair around the stoma very carefully. This will make the adhesive stick better.  Use the stoma measuring guide that comes with your pouch set to decide what size hole you will need to cut in the skin barrier piece. Choose the smallest possible size that will hold the stoma but will not touch it.  Use the guide to trace the circle on the back of the skin barrier piece. Cut out the hole.  Hold the skin barrier piece over the stoma to make sure the hole is the correct size.  Remove the adhesive paper backing from the skin barrier piece.  Squeeze stoma paste around the opening of the skin barrier piece.  Clean and dry the skin around the stoma again.  Carefully fit the skin barrier piece over your stoma.  If you are using a two-piece pouch, snap the pouch onto the skin barrier piece.  Close the tail of the pouch.  Put your hand over the top of the skin barrier piece to help warm it for about 5 minutes, so that it  conforms to your body better.  Wash your hands again. DIET TIPS Because you have a higher risk of a blockage in the first 2 months after getting an ileostomy, you should decrease your fiber intake for that time period. Fibrous foods include:  Celery.  Cabbage (and coleslaw).  Pineapple.  Mushrooms.  Corn and popcorn.  Whole fruits and vegetables, especially with the skins on.  Foods that contain seeds.  Oranges, grapefruit, tangerines, and other citrus fruit.  Nuts. After about 2 months, you can slowly add these kinds of foods back into your regular diet, as tolerated. Other tips:  Drink about eight, 8 oz glasses of water each day.  You can prevent gas by eating slowly and chewing your food thoroughly.  If you feel concerned that you have too much gas, you can cut back on gas-producing foods, such as:  Spicy foods.  Onions and garlic.  Cruciferous vegetables (cabbage, broccoli, cauliflower, Brussels sprouts).  Beans and legumes.  Some cheeses.  Eggs.  Fish.  Bubbly (carbonated) drinks.  Chewing gum. GENERAL TIPS  You can shower with or without the bag in place.  Always keep the bag snapped on if you are bathing or swimming.  If your bag gets wet, you can dry it with a blow-dryer set to cool.  Avoid wearing tight clothing directly over your stoma so that it does not become irritated or bleed. Tight clothing can also prevent the stool from draining into the pouch, which can cause it to leak.  It is helpful to always have an extra skin barrier and pouch with you when traveling. Do not leave them anywhere too warm, because parts of them can melt.  Do not let your seat belt rest on your stoma. Try to keep the seat belt either above or below your stoma, or use a tiny pillow to cushion it.  You can still participate in sports, but you should avoid activities in which there is a risk of getting hit in  the abdomen.  You can still have sex. It is a good idea  to empty your pouch prior to sex. Some people and their partners feel very comfortable seeing the pouch during sex. Others choose to wear lingerie or a T-shirt that covers the device. SEEK IMMEDIATE MEDICAL CARE IF:  You feel dizzy, light-headed, or faint.  You measure pouch drainage of more than 1,500 mL per day. This amount of drainage can lead to dehydration.  You notice a change in the size or color of the stoma, especially if it becomes very red, purple, black, or pale white.  You have bloody stools or bleeding from the ostomy.  You have abdominal pain, nausea, vomiting, or bloating.  There is anything unusual protruding from the ostomy.  You have irritation or red skin around the ostomy.  No stool is passing from the stoma.  You have diarrhea (requiring pouch emptying more frequently than normal).   This information is not intended to replace advice given to you by your health care provider. Make sure you discuss any questions you have with your health care provider.   Document Released: 05/01/2003 Document Revised: 05/19/2014 Document Reviewed: 09/25/2010 Elsevier Interactive Patient Education 2016 [place] on my medicine - ELIQUIS (apixaban)  This medication education was reviewed with me or my healthcare representative as part of my discharge preparation.  The pharmacist that spoke with me during my hospital stay was:  Brain Hilts, [facility]  Why was Eliquis prescribed for you? Eliquis was prescribed for you to reduce the risk of a blood clot forming that can cause a stroke if you have a medical condition called atrial fibrillation (a type of irregular heartbeat).  What do You need to know about Eliquis ? Take your Eliquis TWICE DAILY - one tablet in the morning and one tablet in the evening with or without food. If you have difficulty swallowing the tablet whole please discuss with your pharmacist how to take the medication safely.  Take  Eliquis exactly as prescribed by your doctor and DO NOT stop taking Eliquis without talking to the doctor who prescribed the medication.  Stopping may increase your risk of developing a stroke.  Refill your prescription before you run out.  After discharge, you should have regular check-up appointments with your healthcare provider that is prescribing your Eliquis.  In the future your dose may need to be changed if your kidney function or weight changes by a significant amount or as you get older.  What do you do if you miss a dose? If you miss a dose, take it as soon as you remember on the same day and resume taking twice daily.  Do not take more than one dose of ELIQUIS at the same time to make up a missed dose.  Important Safety Information A possible side effect of Eliquis is bleeding. You should call your healthcare provider right away if you experience any of the following: ? Bleeding from an injury or your nose that does not stop. ? Unusual colored urine (red or dark brown) or unusual colored stools (red or black). ? Unusual bruising for unknown reasons. ? A serious fall or if you hit your head (even if there is no bleeding).  Some medicines may interact with Eliquis and might increase your risk of bleeding or clotting while on Eliquis. To help avoid this, consult your healthcare provider or pharmacist prior to using any new prescription or non-prescription medications, including herbals, vitamins, non-steroidal anti-inflammatory  drugs (NSAIDs) and supplements.  This website has more information on Eliquis (apixaban): http://www.eliquis.com/eliquis/home  Circulation Tips  DO: - change positions frequently - take showers and baths to enhance circulation - massage extremities with lotion after bathing - eat well and regularly - walk for at least ____ minutes each day DON'T: - stand, sit or lie for prolonged periods of time - wear tight clothing on legs, hands and feet - drink alcohol  to stimulate circulation   Intermittent Claudication Intermittent claudication is pain in your leg that occurs when you walk or exercise and goes away when you rest. The pain can occur in one or both legs. CAUSES Intermittent claudication is caused by the buildup of plaque within the major arteries in the body (atherosclerosis). The plaque, which makes arteries stiff and narrow, prevents enough blood from reaching your leg muscles. The pain occurs when you walk or exercise because your muscles need more blood when you are moving and exercising. RISK FACTORS Risk factors include:  A family history of atherosclerosis.  A personal history of stroke or heart disease.  Older age.  Being inactive or overweight.  Smoking cigarettes.  Having another health condition such as:  Diabetes.  High blood pressure.  High cholesterol. SIGNS AND SYMPTOMS  Your hip or leg may:   Ache.  Cramp.  Feel tight.  Feel weak.  Feel heavy. Over time, you may feel pain in your calf, thigh, or hip. DIAGNOSIS  Your health care provider may diagnose intermittent claudication based on your symptoms and medical history. Your health care provider may also do tests to learn more about your condition. These may include:  Blood tests.  An ultrasound.  Imaging tests such as angiography, magnetic resonance angiography (MRA), and computed tomography angiography (CTA). TREATMENT You may be treated for problems such as:  High blood pressure.  High cholesterol.  Diabetes. Other treatments may include:  Lifestyle changes such as:  Starting an exercise program.  Losing weight.  Quitting smoking.  Medicines to help restore blood flow through your legs.  Blood vessel surgery (angioplasty) to restore blood flow if your intermittent claudication is caused by severe peripheral artery disease. HOME CARE INSTRUCTIONS  Manage any other health conditions you have.  Eat a diet low in saturated fats  and calories to maintain a healthy weight.  Quit smoking, if you smoke.  Take medicines only as directed by your health care provider.  If your health care provider recommended an exercise program for you, follow it as directed. Your exercise program may involve:  Walking three or more times a week.  Walking until you have certain symptoms of intermittent claudication.  Resting until symptoms go away.  Gradually increasing walking time to about 50 minutes a day. SEEK MEDICAL CARE IF: Your condition is not getting better or is getting worse. SEEK IMMEDIATE MEDICAL CARE IF:   You have chest pain.  You have difficulty breathing.  You develop arm weakness.  You have trouble speaking.  Your face begins to droop. MAKE SURE YOU:  Understand these instructions.  Will watch your condition.  Will get help if you are not doing well or get worse.   This information is not intended to replace advice given to you by your health care provider. Make sure you discuss any questions you have with your health care provider.   Document Released: 02/29/2004 Document Revised: 05/19/2014 Document Reviewed: 08/04/2013 Elsevier Interactive Patient Education 2016 [employer].   Copyright  VHI. All rights reserved.

## 2015-09-07 NOTE — Anesthesia Postprocedure Evaluation (Signed)
Anesthesia Post Note  Patient: Cathy Jones  Procedure(s) Performed: Procedure(s) (LRB): THROMBECTOMY Left to Right Femoral to  FEMORAL ARTERY Bypass,. (Bilateral)  Patient location during evaluation: PACU Anesthesia Type: General Level of consciousness: awake and alert Pain management: pain level controlled Vital Signs Assessment: post-procedure vital signs reviewed and stable Respiratory status: spontaneous breathing, nonlabored ventilation, respiratory function stable and patient connected to nasal cannula oxygen Cardiovascular status: blood pressure returned to baseline and stable Postop Assessment: no signs of nausea or vomiting Anesthetic complications: no    Last Vitals:  Filed Vitals:   09/07/15 0810 09/07/15 0815  BP:  102/56  Pulse: 78 68  Temp:  37.8 C  Resp: 16 14    Last Pain:  Filed Vitals:   09/07/15 0823  PainSc: 2                  Pixie Burgener,JAMES TERRILL

## 2015-09-07 NOTE — Progress Notes (Addendum)
[facility]  Subjective  - She is gaining more active motion in the left foot and ankle.   Objective 90/52 84 99.2 F (37.3 C) (Oral) 16 94%  Intake/Output Summary (Last 24 hours) at 09/07/15 0716 Last data filed at 09/07/15 0539  Gross per 24 hour  Intake 3283.54 ml  Output   3800 ml  Net -516.46 ml    DP/PT monohasic doppler right.  AT/PT biphasic left Right groin clean and dry no active drainage Tolerating po's well abdomin soft Heart RRR Lungs non labored breathing  Assessment/Planning: POD #2  THROMBECTOMY of left-to-right femoral-femoral bypass via right inguinal approach WBC 10.7 improved continue antibiotic vanc and zosyn Pending intraoperative culture, gram stain negative Keep right groin clean and dry.  Dressing changed OK to transfer per primary provider Dr. Marcello Moores Foot drop right needs walking AFO, improved motion Heparin units per pharmacy for heparin dosing now.  Will re start Eliquis today    Laurence Slate [facility] 09/07/2015 7:16 AM --  Laboratory Lab Results:  Recent Labs  09/06/15 0107 09/07/15 0454  WBC 25.8* 10.7*  HGB 9.9* 8.7*  HCT 30.6* 27.6*  PLT 171 175   BMET  Recent Labs  09/06/15 0107 09/07/15 0454  NA 136 135  K 4.1 3.4*  CL 108 106  CO2 18* 20*  GLUCOSE 106* 103*  BUN 6 8  CREATININE 1.02* 0.82  CALCIUM 7.4* 7.3*    COAG Lab Results  Component Value Date   INR 1.38 09/06/2015   INR 1.05 09/04/2015   INR 1.17 08/10/2015   No results found for: PTT a groove above assessment Motion and right foot is improving She does have some dorsiflexion now and some mild sensation Excellent PT flow right foot and right inguinal wound is healing satisfactorily  GEN be discharged on Saturday from a vascular surgery standpoint-we'll leave that up to Dr. Marcello Moores discretion Patient will need to be discharged on antibiotics

## 2015-09-07 NOTE — Evaluation (Signed)
Occupational Therapy Evaluation Patient Details Name: Cathy Jones MRN: [id number] DOB: 17-Jul-1960 Today's Date: 09/07/2015    History of Present Illness 55 y.o. female admitted to [facility] on 09/02/15 due to dehydration with high output in ileostomy.  CT scan of abdomen/pelvis revealed pelvic abscess which was treated with aspiration and drainage on 09/05/15.  JP drain still in place in right glute.  Post placement of drain was complicated by sudden onset of right leg pain, decreased pulse and decreased motor function.  Vascular surgery was consulted and pt underwent emergent thrombectomy of left to right femoral-femoral bypass via right inguinal approach on 09/05/15.  Pt with significant PMHx of HTN, DOB, anemia, migraine, ARF, R illac artery occlusion, cervical CA, colon resection with dicerting ileostomy, PVD, and recent (07/3015) fem-fem bypass graft bil as well as thrombectomy to the right illiac artery with interoperative arteriogram.     Clinical Impression   This 55 yo female admitted and underwent above presents to acute OT with deficits below affecting her PLOF of independent with basic ADLs. She will benefit from one more session of OT prior to D/C home.    Follow Up Recommendations  No OT follow up    Equipment Recommendations  None recommended by OT       Precautions / Restrictions Precautions Precautions: None Precaution Comments: JP drain to right buttocks, ostomy bag Other Brace/Splint: Pt was in PRAFO, removed today to encourage active movement of right foot.  Restrictions Weight Bearing Restrictions: No      Mobility Bed Mobility             General bed mobility comments: Pt up in recliner upon arrival  Transfers Overall transfer level: Needs assistance Equipment used: Rolling walker (2 wheeled) Transfers: Sit to/from Stand Sit to Stand: Supervision            Balance Overall balance assessment: Needs assistance Sitting-balance support: Feet supported;No  upper extremity supported Sitting balance-Leahy Scale: Good     Standing balance support: Bilateral upper extremity supported Standing balance-Leahy Scale: Fair                              ADL Overall ADL's : Needs assistance/impaired Eating/Feeding: Independent;Sitting   Grooming: Supervision/safety;Set up;Standing;Oral care   Upper Body Bathing: Set up;Sitting   Lower Body Bathing: Set up;Supervison/ safety Lower Body Bathing Details (indicate cue type and reason): Can cross legs to get to feet, harder on for RLE due to pain from incisional site and swelling of thigh--but able to do Upper Body Dressing : Set up;Sitting   Lower Body Dressing: Set up;Supervision/safety;Sit to/from stand Lower Body Dressing Details (indicate cue type and reason): Can cross legs to get to feet, harder on for RLE due to pain from incisional site and swelling of thigh--but able to do Toilet Transfer: Supervision/safety;Ambulation Toilet Transfer Details (indicate cue type and reason): recliner>sink (then ambulated with PT to come back to room and sat on bed) Toileting- Clothing Manipulation and Hygiene: Supervision/safety;Sit to/from stand                         Pertinent Vitals/Pain Pain Assessment: 0-10 Pain Score: 7  Pain Location: right groin and buttocks Pain Descriptors / Indicators: Aching;Burning Pain Intervention(s): Monitored during session     Hand Dominance Right   Extremity/Trunk Assessment Upper Extremity Assessment Upper Extremity Assessment: Overall WFL for tasks assessed  Communication Communication Communication: No difficulties   Cognition Arousal/Alertness: Awake/alert Behavior During Therapy: WFL for tasks assessed/performed Overall Cognitive Status: Within Functional Limits for tasks assessed                        Exercises   Other Exercises Other Exercises: Pt able to move RLE toes and ankle today. Educated pt on  actively moving her toes towards her nose while seated and then using a sheet/blanket to help stretch it further.        Home Living Family/patient expects to be discharged to:: Private residence Living Arrangements: Spouse/significant other Available Help at Discharge: Family;Available PRN/intermittently (husband works days, parents can come by) Type of Home: House Home Access: Stairs to enter Technical brewer of Steps: 3 Entrance Stairs-Rails: None (columns to hold onto on both sides) Home Layout: One level     Bathroom Shower/Tub: Tub/shower unit;Curtain Shower/tub characteristics: Architectural technologist: Standard     Home Equipment: Shower seat;Hand held shower head;Toilet riser          Prior Functioning/Environment Level of Independence: Independent        Comments: very recent sudden right leg weakness and dysfunction    OT Diagnosis: Generalized weakness;Acute pain   OT Problem List: Decreased strength;Impaired balance (sitting and/or standing);Pain;Decreased range of motion   OT Treatment/Interventions: Self-care/ADL training;Patient/family education;Balance training;DME and/or AE instruction    OT Goals(Current goals can be found in the care plan section) Acute Rehab OT Goals Patient Stated Goal: to get this foot (right) working even better since it's better than it was yesteday OT Goal Formulation: With patient Time For Goal Achievement: 09/14/15 Potential to Achieve Goals: Good  OT Frequency: Min 2X/week              End of Session Equipment Utilized During Treatment: Rolling walker  Activity Tolerance: Patient tolerated treatment well Patient left:  (walking with PT)   Time: [id number] OT Time Calculation (min): 14 min Charges:  OT General Charges $OT Visit: 1 Procedure OT Evaluation $OT Eval Moderate Complexity: 1 Procedure  Almon Register [id number] 09/07/2015, 4:23 PM

## 2015-09-07 NOTE — Progress Notes (Signed)
Physical Therapy Treatment Patient Details Name: Cathy Jones MRN: [id number] DOB: 09-Jan-1961 Today's Date: 09/07/2015    History of Present Illness 55 y.o. female admitted to [facility] on 09/02/15 due to dehydration with high output in ileostomy.  CT scan of abdomen/pelvis revealed pelvic abscess which was treated with aspiration and drainage on 09/05/15.  JP drain still in place in right glute.  Post placement of drain was complicated by sudden onset of right leg pain, decreased pulse and decreased motor function.  Vascular surgery was consulted and pt underwent emergent thrombectomy of left to right femoral-femoral bypass via right inguinal approach on 09/05/15.  Pt with significant PMHx of HTN, DOB, anemia, migraine, ARF, R illac artery occlusion, cervical CA, colon resection with dicerting ileostomy, PVD, and recent (07/3015) fem-fem bypass graft bil as well as thrombectomy to the right illiac artery with interoperative arteriogram.      PT Comments    Pt is making excellent progress with gait and return of right foot sensation and strength. Much improved over her last session. She was able to walk into the hallway with RW and got up to supervision assist.  PT will continue to follow acutely.    Follow Up Recommendations  Supervision for mobility/OOB;Home health PT     Equipment Recommendations  Rolling walker with 5" wheels    Recommendations for Other Services   NA     Precautions / Restrictions Precautions Precautions: None Precaution Comments: JP drain to right buttocks, ostomy bag Other Brace/Splint: Pt was in PRAFO, removed today to encourage active movement of right foot.  Restrictions Weight Bearing Restrictions: No    Mobility  Bed Mobility Overal bed mobility: Modified Independent             General bed mobility comments: Extra time and effort needed to lift both legs back into the bed, at times using hands to assist.   Transfers Overall transfer level: Needs  assistance Equipment used: Rolling walker (2 wheeled) Transfers: Sit to/from Stand Sit to Stand: Supervision         General transfer comment: supervision to go to sitting from standing with verbal cues to sit up higher on the side of the bed to avoid scooting.   Ambulation/Gait Ambulation/Gait assistance: Min guard;Supervision Ambulation Distance (Feet): 120 Feet Assistive device: Rolling walker (2 wheeled) Gait Pattern/deviations: Step-through pattern;Decreased dorsiflexion - right;Steppage Gait velocity: decreased Gait velocity interpretation: Below normal speed for age/gender General Gait Details: Pt started as min guard assist for safety, but was able to work up to supervision with RW.  As she fatigued right foot drop increased and steppage gait pattern used.           Balance Overall balance assessment: Needs assistance Sitting-balance support: Feet supported;No upper extremity supported Sitting balance-Leahy Scale: Good     Standing balance support: Bilateral upper extremity supported Standing balance-Leahy Scale: Fair                      Cognition Arousal/Alertness: Awake/alert Behavior During Therapy: WFL for tasks assessed/performed Overall Cognitive Status: Within Functional Limits for tasks assessed                             Pertinent Vitals/Pain Pain Assessment: 0-10 Pain Score: 7  Pain Location: right groin and buttocks Pain Descriptors / Indicators: Aching;Burning Pain Intervention(s): Limited activity within patient's tolerance;Monitored during session;Repositioned  PT Goals (current goals can now be found in the care plan section) Acute Rehab PT Goals Patient Stated Goal: to get better, have return of her right foot function Progress towards PT goals: Progressing toward goals    Frequency  Min 3X/week    PT Plan Current plan remains appropriate       End of Session   Activity Tolerance: Patient limited by  pain Patient left: in bed;with call bell/phone within reach     Time: 1513-1530 PT Time Calculation (min) (ACUTE ONLY): 17 min  Charges:  $Gait Training: 8-22 mins                      Chevez Sambrano B. [place], [place], DPT [id number]   09/07/2015, 3:46 PM

## 2015-09-08 LAB — CBC
HCT: 28.7 % — ABNORMAL LOW (ref 36.0–46.0)
HEMOGLOBIN: 8.9 g/dL — AB (ref 12.0–15.0)
MCH: 25.5 pg — AB (ref 26.0–34.0)
MCHC: 31 g/dL (ref 30.0–36.0)
MCV: 82.2 fL (ref 78.0–100.0)
Platelets: 190 10*3/uL (ref 150–400)
RBC: 3.49 MIL/uL — ABNORMAL LOW (ref 3.87–5.11)
RDW: 16.4 % — ABNORMAL HIGH (ref 11.5–15.5)
WBC: 8.5 10*3/uL (ref 4.0–10.5)

## 2015-09-08 LAB — CULTURE, ROUTINE-ABSCESS: Special Requests: NORMAL

## 2015-09-08 LAB — WOUND CULTURE: CULTURE: NO GROWTH

## 2015-09-08 LAB — BASIC METABOLIC PANEL
Anion gap: 6 (ref 5–15)
BUN: <5 mg/dL — ABNORMAL LOW (ref 6–20)
CALCIUM: 8 mg/dL — AB (ref 8.9–10.3)
CHLORIDE: 112 mmol/L — AB (ref 101–111)
CO2: 23 mmol/L (ref 22–32)
CREATININE: 0.7 mg/dL (ref 0.44–1.00)
Glucose, Bld: 91 mg/dL (ref 65–99)
Potassium: 3.5 mmol/L (ref 3.5–5.1)
SODIUM: 141 mmol/L (ref 135–145)

## 2015-09-08 MED ORDER — FERROUS SULFATE 325 (65 FE) MG PO TABS
325.0000 mg | ORAL_TABLET | Freq: Three times a day (TID) | ORAL | Status: DC
  Administered 2015-09-08: 325 mg via ORAL
  Filled 2015-09-08: qty 1

## 2015-09-08 MED ORDER — AMOXICILLIN-POT CLAVULANATE 875-125 MG PO TABS: 1.0000 | ORAL_TABLET | Freq: Two times a day (BID) | ORAL | Status: DC

## 2015-09-08 MED ORDER — FERROUS SULFATE 325 (65 FE) MG PO TABS: 325.0000 mg | ORAL_TABLET | Freq: Three times a day (TID) | ORAL | Status: DC

## 2015-09-08 MED ORDER — LOPERAMIDE HCL 2 MG PO CAPS
4.0000 mg | ORAL_CAPSULE | Freq: Three times a day (TID) | ORAL | Status: DC
  Administered 2015-09-08: 4 mg via ORAL
  Filled 2015-09-08: qty 2

## 2015-09-08 MED ORDER — OPIUM 10 MG/ML (1%) PO TINC: 2.0000 mL | Freq: Four times a day (QID) | ORAL | Status: DC

## 2015-09-08 MED ORDER — CALCIUM POLYCARBOPHIL 625 MG PO TABS: 625.0000 mg | ORAL_TABLET | Freq: Every day | ORAL | Status: DC

## 2015-09-08 MED ORDER — HEPARIN SOD (PORK) LOCK FLUSH 100 UNIT/ML IV SOLN
250.0000 [IU] | INTRAVENOUS | Status: AC | PRN
  Administered 2015-09-08: 250 [IU]

## 2015-09-08 MED ORDER — CALCIUM POLYCARBOPHIL 625 MG PO TABS
625.0000 mg | ORAL_TABLET | Freq: Every day | ORAL | Status: DC
  Administered 2015-09-08: 13:00:00 via ORAL
  Filled 2015-09-08: qty 1

## 2015-09-08 NOTE — Progress Notes (Addendum)
Occupational Therapy Treatment Patient Details Name: Cathy Jones MRN: [id number] DOB: December 16, 1960 Today's Date: 09/08/2015    History of present illness 55 y.o. female admitted to [facility] on 09/02/15 due to dehydration with high output in ileostomy.  CT scan of abdomen/pelvis revealed pelvic abscess which was treated with aspiration and drainage on 09/05/15.  JP drain still in place in right glute.  Post placement of drain was complicated by sudden onset of right leg pain, decreased pulse and decreased motor function.  Vascular surgery was consulted and pt underwent emergent thrombectomy of left to right femoral-femoral bypass via right inguinal approach on 09/05/15.  Pt with significant PMHx of HTN, DOB, anemia, migraine, ARF, R illac artery occlusion, cervical CA, colon resection with dicerting ileostomy, PVD, and recent (07/3015) fem-fem bypass graft bil as well as thrombectomy to the right illiac artery with interoperative arteriogram.     OT comments  Education provided in session. Feel pt is safe to d/c home, from OT standpoint.   Follow Up Recommendations  No OT follow up;Supervision - Intermittent    Equipment Recommendations  None recommended by OT    Recommendations for Other Services      Precautions / Restrictions Precautions Precautions: Fall Restrictions Weight Bearing Restrictions: No       Mobility Bed Mobility               General bed mobility comments: not assessed  Transfers Overall transfer level: Needs assistance   Transfers: Sit to/from Stand Sit to Stand/Stand to Sit: Supervision (see comments below)         General transfer comment: Mod I-sit to stand from toilet; OT managed IV pole for stand to sit to chair     Balance                                   ADL Overall ADL's : Needs assistance/impaired     Grooming: Wash/dry hands;Standing;Modified independent               Lower Body Dressing: Sitting/lateral leans;Set  up Lower Body Dressing Details (indicate cue type and reason): donned socks Toilet Transfer: Ambulation;Comfort height toilet (Supervision ambulating in hallway with IV pole and Mod I-supervision in room/Mod I in bathroom with IV pole-pt became out of breath in hallway; Min guard ambulating without IV pole and without RW; Supervision with RW)    Toileting- Clothing Manipulation and Hygiene: Modified independent;Sit to/from stand (pt also emptied colostomy bag)   Tub/ Shower Transfer: Supervision/safety;Ambulation   Functional mobility during ADLs:  (Supervision ambulating in hallway with IV pole and Mod I-supervision in room/Mod I in bathroom with IV pole-pt became out of breath in hallway; Min guard ambulating without IV pole and without RW; Supervision with RW)  General ADL Comments: Educated on energy conservation techniques. Educated on safety such as sitting for LB ADLs, rugs/items on floor, pets. Recommended someone be with her for tub transfer. Educated on tub transfer techniques.      Vision                     Perception     Praxis      Cognition  Awake/Alert Behavior During Therapy: WFL for tasks assessed/performed Overall Cognitive Status: Within Functional Limits for tasks assessed                       Extremity/Trunk Assessment  Exercises Other Exercises Other Exercises: encouraged pt to be doing ankle pumps and to elevate LEs   Shoulder Instructions       General Comments      Pertinent Vitals/ Pain       Pain Assessment: 0-10 Pain Score: 5  Pain Location: groin Pain Descriptors / Indicators: Burning Pain Intervention(s): Monitored during session  Home Living                                          Prior Functioning/Environment              Frequency Min 2X/week     Progress Toward Goals  OT Goals(current goals can now be found in the care plan section)  Progress towards OT goals:  Progressing toward goals-adequate for d/c  Acute Rehab OT Goals Patient Stated Goal: not stated OT Goal Formulation: With patient Time For Goal Achievement: 09/14/15 Potential to Achieve Goals: Good ADL Goals Pt Will Perform Grooming: with set-up;with supervision;standing (2 tasks) Pt Will Transfer to Toilet: ambulating;bedside commode;with modified independence (over toilet) Pt Will Perform Toileting - Clothing Manipulation and hygiene: with modified independence;sit to/from stand Pt Will Perform Tub/Shower Transfer: Tub transfer;with supervision;ambulating;rolling walker;shower seat  Plan Discharge plan needs to be updated    Co-evaluation                 End of Session Equipment Utilized During Treatment: Rolling walker   Activity Tolerance Patient tolerated treatment well   Patient Left in chair;with call bell/phone within reach;with family/visitor present   Nurse Communication          Time: [id number] OT Time Calculation (min): 15 min  Charges: OT General Charges $OT Visit: 1 Procedure OT Treatments $Self Care/Home Management : 8-22 mins  Benito Mccreedy OTR/L [id number] 09/08/2015, 12:42 PM

## 2015-09-08 NOTE — Discharge Summary (Signed)
Physician Discharge Summary  Patient ID: Cathy Jones MRN: [id number] DOB/AGE: Nov 25, 1960 55 y.o.  Admit date: 09/02/2015 Discharge date: 09/08/2015  Patient Care Team: Aletha Halim, PA-C as PCP - General (Family Medicine) Leighton Ruff, MD as Consulting Physician (General Surgery) Irene Shipper, MD as Consulting Physician (Gastroenterology)  Admission Diagnoses: Principal Problem:   Dehydration Active Problems:   Rectal stricture s/p LAR resection [id number]   Hyperlipidemia   Depression   Ileostomy in place [facility])   Folic acid deficiency anemia   High output ileostomy ([place])   Arterial occlusion (HCC)   Leukocytosis   PAD (peripheral artery disease) ([place])   Malnutrition of moderate degree   Acute renal failure (ARF) ([place])   Discharge Diagnoses:  Principal Problem:   Dehydration Active Problems:   Rectal stricture s/p LAR resection [id number]   Hyperlipidemia   Depression   Ileostomy in place [facility])   Folic acid deficiency anemia   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  SURGERY:  09/02/2015 - 09/05/2015  Procedure(s): THROMBECTOMY Left to Right Femoral to  FEMORAL ARTERY Bypass,.  SURGEON:    Surgeon(s): Mal Misty, MD  Consults: vascular surgery  PT OT [facility] Course:   Pt well known to Dr. Marcello Moores. Having high ileostomy output. Unable to keep up with losses at home despite 3 L of saline infused at home over the last 48h. She was averaging 2-4 L ileostomy output a day despite maximal medical management to slow her output.  She was readmitted.  Rehydrated.  Started on opium tincture drops to control diarrhea.  Made nothing by mouth.    CT scan found evidence of fluid collection/abscess and suspicious for  leak.  Underwent percutaneous drainage in interventional radiology of pelvic abscess.  Unfortunately developed and coolness.  Found to have clotting of her femorofemoral bypass.  Transferred to [facility] with vascular surgery emergent consultation.  She underwent thrombectomy surgery as noted above.  Postoperatively, the patient gradually mobilized and advanced to a solid diet.  Pain and other symptoms were treated aggressively.    By the time of discharge, the patient was walking well the hallways, eating food, having flatus.  Ileostomy output was thick and pasty.  Not dehydrated on IV fluids being weaned.  Pain was well-controlled on an oral medications.  Based on meeting discharge criteria and continuing to recover, I felt it was safe for the patient to be discharged from the hospital to further recover with close followup.  She has help from family and friends.  She has home health set up.  She feels comfortable doing IV fluid boluses every day through her PICC line.   She feels convinced that she is better on antibiotics.  We will continue antibiotics at home.  Transitioned to oral Augmentin for now.  Postoperative recommendations were discussed in detail.  They are written as well.   Significant Diagnostic Studies:  Results for orders placed or performed during the hospital encounter of 09/02/15 (from the past 72 hour(s))  Culture, routine-abscess     Status: None (Preliminary result)   Collection Time: 09/05/15  1:17 PM  Result Value Ref Range   Specimen Description ABSCESS PERIRECTAL    Special Requests Normal    Gram  Stain      ABUNDANT WBC PRESENT,BOTH PMN AND MONONUCLEAR NO SQUAMOUS EPITHELIAL CELLS SEEN ABUNDANT GRAM POSITIVE COCCI IN PAIRS IN CLUSTERS MODERATE GRAM POSITIVE RODS FEW GRAM NEGATIVE RODS Performed at [facility]    Culture      ABUNDANT [place] Performed at [facility]    Report Status PENDING   Type and screen     Status:  None   Collection Time: 09/05/15  7:00 PM  Result Value Ref Range   ABO/RH(D) A POS    Antibody Screen NEG    Sample Expiration 09/08/2015    Unit Number [id number]    Blood Component Type RED CELLS,LR    Unit division 00    Status of Unit REL FROM [facility]    Transfusion Status OK TO TRANSFUSE    Crossmatch Result Compatible    Unit Number [id number]    Blood Component Type RED CELLS,LR    Unit division 00    Status of Unit ISSUED,FINAL    Transfusion Status OK TO TRANSFUSE    Crossmatch Result Compatible    Unit Number [id number]    Blood Component Type RED CELLS,LR    Unit division 00    Status of Unit ISSUED,FINAL    Transfusion Status OK TO TRANSFUSE    Crossmatch Result Compatible   I-STAT 4, (NA,K, GLUC, HGB,HCT)     Status: Abnormal   Collection Time: 09/05/15  7:04 PM  Result Value Ref Range   Sodium 139 135 - 145 mmol/L   Potassium 3.7 3.5 - 5.1 mmol/L   Glucose, Bld 130 (H) 65 - 99 mg/dL   HCT 32.0 (L) 36.0 - 46.0 %   Hemoglobin 10.9 (L) 12.0 - 15.0 g/dL  Anaerobic culture     Status: None (Preliminary result)   Collection Time: 09/05/15  7:36 PM  Result Value Ref Range   Specimen Description WOUND RIGHT GROIN    Special Requests POF VANCOMYCIN AND ZOSYN    Gram Stain      FEW WBC PRESENT, PREDOMINANTLY PMN NO SQUAMOUS EPITHELIAL CELLS SEEN NO ORGANISMS SEEN Performed at [facility]    Culture PENDING    Report Status PENDING   Gram stain     Status: None   Collection Time: 09/05/15  7:36 PM  Result Value Ref Range   Specimen Description WOUND RIGHT GROIN    Special Requests POF VANCOMYCIN AND ZOSYN    Gram Stain      DEGENERATED CELLULAR MATERIAL PRESENT NO ORGANISMS SEEN    Report Status 09/06/2015 FINAL   Wound culture     Status: None   Collection Time: 09/05/15  7:36 PM  Result Value Ref Range   Specimen Description WOUND RIGHT GROIN    Special Requests POF VANCOMYCIN AND ZOSYN    Gram Stain      FEW WBC PRESENT,  PREDOMINANTLY PMN NO SQUAMOUS EPITHELIAL CELLS SEEN NO ORGANISMS SEEN Performed at [facility]    Culture      NO GROWTH 2 DAYS Performed at [facility]    Report Status 09/08/2015 FINAL   Glucose, capillary     Status: Abnormal   Collection Time: 09/05/15  8:56 PM  Result Value Ref Range   Glucose-Capillary 158 (H) 65 - 99 mg/dL  CBC     Status: Abnormal   Collection Time: 09/06/15  1:07 AM  Result Value Ref Range   WBC 25.8 (H) 4.0 - 10.5 K/uL   RBC 3.70 (  L) 3.87 - 5.11 MIL/uL   Hemoglobin 9.9 (L) 12.0 - 15.0 g/dL   HCT 30.6 (L) 36.0 - 46.0 %   MCV 82.7 78.0 - 100.0 fL   MCH 26.8 26.0 - 34.0 pg   MCHC 32.4 30.0 - 36.0 g/dL   RDW 15.9 (H) 11.5 - 15.5 %   Platelets 171 150 - 400 K/uL  Basic metabolic panel     Status: Abnormal   Collection Time: 09/06/15  1:07 AM  Result Value Ref Range   Sodium 136 135 - 145 mmol/L   Potassium 4.1 3.5 - 5.1 mmol/L   Chloride 108 101 - 111 mmol/L   CO2 18 (L) 22 - 32 mmol/L   Glucose, Bld 106 (H) 65 - 99 mg/dL   BUN 6 6 - 20 mg/dL   Creatinine, Ser 1.02 (H) 0.44 - 1.00 mg/dL   Calcium 7.4 (L) 8.9 - 10.3 mg/dL   GFR calc non Af Amer >60 >60 mL/min   GFR calc Af Amer >60 >60 mL/min    Comment: (NOTE) The eGFR has been calculated using the CKD EPI equation. This calculation has not been validated in all clinical situations. eGFR's persistently <60 mL/min signify possible Chronic Kidney Disease.    Anion gap 10 5 - 15  Protime-INR     Status: Abnormal   Collection Time: 09/06/15  1:07 AM  Result Value Ref Range   Prothrombin Time 17.1 (H) 11.6 - 15.2 seconds   INR 1.38 0.00 - 1.49  APTT     Status: Abnormal   Collection Time: 09/06/15  1:07 AM  Result Value Ref Range   aPTT 87 (H) 24 - 37 seconds    Comment:        IF BASELINE aPTT IS ELEVATED, SUGGEST PATIENT RISK ASSESSMENT BE USED TO DETERMINE APPROPRIATE ANTICOAGULANT THERAPY.   Heparin level (unfractionated)     Status: Abnormal   Collection Time:  09/06/15  1:07 AM  Result Value Ref Range   Heparin Unfractionated 0.25 (L) 0.30 - 0.70 IU/mL    Comment:        IF HEPARIN RESULTS ARE BELOW EXPECTED VALUES, AND PATIENT DOSAGE HAS BEEN CONFIRMED, SUGGEST FOLLOW UP TESTING OF ANTITHROMBIN III LEVELS.   MRSA PCR Screening     Status: None   Collection Time: 09/06/15  6:35 AM  Result Value Ref Range   MRSA by PCR NEGATIVE NEGATIVE    Comment:        The GeneXpert MRSA Assay (FDA approved for NASAL specimens only), is one component of a comprehensive MRSA colonization surveillance program. It is not intended to diagnose MRSA infection nor to guide or monitor treatment for MRSA infections.   Provider-confirm verbal Blood Bank order - RBC, Type & Screen; 3 Units; Order taken: 09/05/2015; 7:45 PM; Surgery 3 RBC ordered, 2 issued, 1 returned     Status: None   Collection Time: 09/06/15  9:00 AM  Result Value Ref Range   Blood product order confirm MD AUTHORIZATION REQUESTED   Occult blood card to lab, stool RN will collect     Status: None   Collection Time: 09/06/15 12:29 PM  Result Value Ref Range   Fecal Occult Bld NEGATIVE NEGATIVE  CBC     Status: Abnormal   Collection Time: 09/07/15  4:54 AM  Result Value Ref Range   WBC 10.7 (H) 4.0 - 10.5 K/uL   RBC 3.39 (L) 3.87 - 5.11 MIL/uL   Hemoglobin 8.7 (L) 12.0 - 15.0 g/dL  HCT 27.6 (L) 36.0 - 46.0 %   MCV 81.4 78.0 - 100.0 fL   MCH 25.7 (L) 26.0 - 34.0 pg   MCHC 31.5 30.0 - 36.0 g/dL   RDW 16.4 (H) 11.5 - 15.5 %   Platelets 175 150 - 400 K/uL  APTT     Status: Abnormal   Collection Time: 09/07/15  4:54 AM  Result Value Ref Range   aPTT 59 (H) 24 - 37 seconds    Comment:        IF BASELINE aPTT IS ELEVATED, SUGGEST PATIENT RISK ASSESSMENT BE USED TO DETERMINE APPROPRIATE ANTICOAGULANT THERAPY.   Heparin level (unfractionated)     Status: Abnormal   Collection Time: 09/07/15  4:54 AM  Result Value Ref Range   Heparin Unfractionated <0.10 (L) 0.30 - 0.70 IU/mL     Comment:        IF HEPARIN RESULTS ARE BELOW EXPECTED VALUES, AND PATIENT DOSAGE HAS BEEN CONFIRMED, SUGGEST FOLLOW UP TESTING OF ANTITHROMBIN III LEVELS. REPEATED TO VERIFY   Basic metabolic panel     Status: Abnormal   Collection Time: 09/07/15  4:54 AM  Result Value Ref Range   Sodium 135 135 - 145 mmol/L   Potassium 3.4 (L) 3.5 - 5.1 mmol/L    Comment: DELTA CHECK NOTED   Chloride 106 101 - 111 mmol/L   CO2 20 (L) 22 - 32 mmol/L   Glucose, Bld 103 (H) 65 - 99 mg/dL   BUN 8 6 - 20 mg/dL   Creatinine, Ser 0.82 0.44 - 1.00 mg/dL   Calcium 7.3 (L) 8.9 - 10.3 mg/dL   GFR calc non Af Amer >60 >60 mL/min   GFR calc Af Amer >60 >60 mL/min    Comment: (NOTE) The eGFR has been calculated using the CKD EPI equation. This calculation has not been validated in all clinical situations. eGFR's persistently <60 mL/min signify possible Chronic Kidney Disease.    Anion gap 9 5 - 15  CBC     Status: Abnormal   Collection Time: 09/08/15  5:55 AM  Result Value Ref Range   WBC 8.5 4.0 - 10.5 K/uL   RBC 3.49 (L) 3.87 - 5.11 MIL/uL   Hemoglobin 8.9 (L) 12.0 - 15.0 g/dL   HCT 28.7 (L) 36.0 - 46.0 %   MCV 82.2 78.0 - 100.0 fL   MCH 25.5 (L) 26.0 - 34.0 pg   MCHC 31.0 30.0 - 36.0 g/dL   RDW 16.4 (H) 11.5 - 15.5 %   Platelets 190 150 - 400 K/uL  Basic metabolic panel     Status: Abnormal   Collection Time: 09/08/15  5:55 AM  Result Value Ref Range   Sodium 141 135 - 145 mmol/L   Potassium 3.5 3.5 - 5.1 mmol/L   Chloride 112 (H) 101 - 111 mmol/L   CO2 23 22 - 32 mmol/L   Glucose, Bld 91 65 - 99 mg/dL   BUN <5 (L) 6 - 20 mg/dL   Creatinine, Ser 0.70 0.44 - 1.00 mg/dL   Calcium 8.0 (L) 8.9 - 10.3 mg/dL   GFR calc non Af Amer >60 >60 mL/min   GFR calc Af Amer >60 >60 mL/min    Comment: (NOTE) The eGFR has been calculated using the CKD EPI equation. This calculation has not been validated in all clinical situations. eGFR's persistently <60 mL/min signify possible Chronic  Kidney Disease.    Anion gap 6 5 - 15    Ct Image Guided Drainage By  Percutaneous Catheter  09/05/2015  INDICATION: 55 year old female status post LAR with loop ileostomy and ureteral stent placement for distal colonic stricture following cervical cancer resection and radiation therapy. A fluid and gas collection is present in the deep anatomic pelvis adjacent to the rectum. Barium enema demonstrates an anastomotic leak. She presents for CT-guided drainage. EXAM: CT GUIDED DRAINAGE OF  ABSCESS MEDICATIONS: The patient is currently admitted to the hospital and receiving intravenous antibiotics. The antibiotics were administered within an appropriate time frame prior to the initiation of the procedure. ANESTHESIA/SEDATION: 2 mg IV Versed 100 mcg IV Fentanyl Moderate Sedation Time:  23 The patient was continuously monitored during the procedure by the interventional radiology nurse under my direct supervision. COMPLICATIONS: None immediate. TECHNIQUE: Informed written consent was obtained from the patient after a thorough discussion of the procedural risks, benefits and alternatives. All questions were addressed. Maximal Sterile Barrier Technique was utilized including caps, mask, sterile gowns, sterile gloves, sterile drape, hand hygiene and skin antiseptic. A timeout was performed prior to the initiation of the procedure. PROCEDURE: The operative field was prepped with Chlorhexidine in a sterile fashion, and a sterile drape was applied covering the operative field. A sterile gown and sterile gloves were used for the procedure. Local anesthesia was provided with 1% Lidocaine. Using intermittent CT fluoroscopic guidance, an 18 gauge trocar needle was advanced via a right trans gluteal approach into the right perirectal fluid collection. An Amplatz wire was coiled in the collection. The needle was removed and the skin tract serially dilated to 10 [place]. A Cook 10 [place] all-purpose drainage catheter was then  advanced over the wire and formed within the fluid collection. Aspiration yields 16 mL thick, purulent foul-smelling material. Axial CT imaging confirms excellent positioning of the drainage catheter. There is no evidence of complication. The drain was secured to the skin with 0 Prolene suture and an adhesive fixation device. The drain was connected to JP bulb suction. FINDINGS: Aspiration yields thick, purulent and foul smelling material. IMPRESSION: Successful placement of a 10 French percutaneous drainage catheter via a right trans gluteal approach. Aspiration yields thick, foul-smelling purulent material. Signed, Criselda Peaches, MD [facility] Electronically Signed   By: Jacqulynn Cadet M.D.   On: 09/05/2015 15:14    Discharge Exam: Blood pressure 105/51, pulse 72, temperature 97.4 F (36.3 C), temperature source Oral, resp. rate 17, height _0  (1.575 m), weight 96.843 kg (213 lb 8 oz), SpO2 97 %.  General: Pt awake/alert/oriented x4 in no major acute distress Eyes: PERRL, normal EOM. Sclera nonicteric Neuro: CN II-XII intact w/o focal sensory/motor deficits. Lymph: No head/neck/groin lymphadenopathy Psych:  No delerium/psychosis/paranoia HENT: Normocephalic, Mucus membranes moist.  No thrush Neck: Supple, No tracheal deviation Chest: No pain.  Good respiratory excursion. CV:  Pulses intact.  Regular rhythm MS: Normal AROM mjr joints.  No obvious deformity Abdomen: Soft, Nondistended.  Nontender.  No incarcerated hernias.  Right lower quadrant ileostomy thick minimal output. Ext:  SCDs BLE.  R?L 2+ BLE edema.  Intact distal neurovascular.  No cyanosis Skin: No petechiae / purpura  Discharged Condition: Improved but guarded   Past Medical History  Diagnosis Date  . Hypertension   . Hyperlipidemia   . Asthma     pt brought her proair inhaler 06-06-14  . Colon polyp     hyperplastic  . Colon stricture ([place])   .  Colonic obstruction ([place])   . Depression   . History of hiatal hernia   .  Family history of adverse reaction to anesthesia     pts mother experiences nausea and vomiting   . Shortness of breath dyspnea     seasonal   . Anemia   . GERD (gastroesophageal reflux disease)   . Migraine     "q couple years maybe" (08/07/2015)  . Anxiety   . Acute renal failure (ARF) ([place]) 08/07/2015  . 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 Cervical cancer [facility])     Past Surgical History  Procedure Laterality Date  . Colonoscopy    . Upper gastrointestinal endoscopy    . Tubal ligation    . Colon resection N/A 06/27/2015    Procedure: LYSIS OF ADHESIONS LOW ANTERIOR RESECTION DIVERTING LOOP ILEOSTOMY;  Surgeon: Leighton Ruff, MD;  Location: [facility];  Service: General;  Laterality: N/A;  . Diverting ileostomy N/A 06/27/2015    Procedure: DIVERTING LOOP ILEOSTOMY;  Surgeon: Leighton Ruff, MD;  Location: [facility];  Service: General;  Laterality: N/A;  . Cystoscopy with stent placement Bilateral 06/27/2015    Procedure: CYSTOSCOPY WITH CATHETER  PLACEMENT;  Surgeon: Franchot Gallo, MD;  Location: [facility];  Service: Urology;  Laterality: Bilateral;  . Colon surgery    . Abdominal hysterectomy  1994  . Peripheral vascular catheterization N/A 08/09/2015    Procedure: Abdominal Aortogram w/Lower Extremity;  Surgeon: Conrad  Chapel, MD;  Location: [place] CV LAB;  Service: Cardiovascular;  Laterality: N/A;  . Peripheral vascular catheterization Left 08/09/2015    Procedure: Peripheral Vascular Intervention;  Surgeon: Conrad Grantfork, MD;  Location: [place] CV LAB;  Service: Cardiovascular;  Laterality: Left;  common iliac  . Femoral-femoral bypass graft Bilateral 08/10/2015    Procedure: BYPASS GRAFT LEFT FEMORALTO RIGHT FEMORAL ARTERY;  Surgeon: Rosetta Posner, MD;  Location: [place];  Service: Vascular;  Laterality: Bilateral;  . Thrombectomy iliac artery Right 08/10/2015    Procedure: THROMBECTOMY Right ILIAC  ARTERY;  Surgeon: Rosetta Posner, MD;  Location: [place];  Service: Vascular;  Laterality: Right;  . Intraoperative arteriogram Right 08/10/2015    Procedure: INTRA OPERATIVE ARTERIOGRAM Right Iliac Artery;  Surgeon: Rosetta Posner, MD;  Location: [facility] OR;  Service: Vascular;  Laterality: Right;  . Thrombectomy femoral artery Bilateral 09/05/2015    Procedure: THROMBECTOMY Left to Right Femoral to  FEMORAL ARTERY Bypass,.;  Surgeon: Mal Misty, MD;  Location: [facility] OR;  Service: Vascular;  Laterality: Bilateral;    Social History   Social History  . Marital Status: Married    Spouse Name: N/A  . Number of Children: 1  . Years of Education: N/A   Occupational History  . Not on file.   Social History Main Topics  . Smoking status: Former Smoker -- 1.00 packs/day for 40 years    Types: Cigarettes    Quit date: 05/09/2015  . Smokeless tobacco: Never Used  . Alcohol Use: No  . Drug Use: No  . Sexual Activity: Yes   Other Topics Concern  . Not on file   Social History Narrative    Family History  Problem Relation Age of Onset  . Colon cancer Neg Hx   . Stomach cancer Neg Hx   . Esophageal cancer Neg Hx   . Deep vein thrombosis Neg Hx   . Rectal cancer Cousin   . Hypertension Mother   . Hyperparathyroidism Mother   . Hypertension Father   . CAD Father     CABG  . Peripheral vascular disease Father   .  Graves' disease Father     Current Facility-Administered Medications  Medication Dose Route Frequency Provider Last Rate Last Dose  . 0.9 %  sodium chloride infusion   Intravenous Continuous Leighton Ruff, MD 50 mL/hr at 09/07/15 1458    . acetaminophen (TYLENOL) tablet 325-650 mg  325-650 mg Oral Q4H PRN Ulyses Amor, PA-C       Or  . acetaminophen (TYLENOL) suppository 325-650 mg  325-650 mg Rectal Q4H PRN Ulyses Amor, PA-C      . albuterol (PROVENTIL) (2.5 MG/3ML) 0.083% nebulizer solution 3 mL  3 mL Inhalation V7Q PRN Leighton Ruff, MD      . alum & mag hydroxide-simeth  (MAALOX/MYLANTA) 200-200-20 MG/5ML suspension 15-30 mL  15-30 mL Oral Q2H PRN Ulyses Amor, PA-C      . apixaban (ELIQUIS) tablet 5 mg  5 mg Oral BID Erenest Blank, RPH   5 mg at 09/08/15 1033  . atorvastatin (LIPITOR) tablet 10 mg  10 mg Oral q morning - 46N Leighton Ruff, MD   10 mg at 09/02/15 1728  . diphenhydrAMINE (BENADRYL) 12.5 MG/5ML elixir 12.5 mg  12.5 mg Oral G2X PRN Leighton Ruff, MD       Or  . diphenhydrAMINE (BENADRYL) injection 12.5 mg  12.5 mg Intravenous B2W PRN Leighton Ruff, MD      . diphenoxylate-atropine (LOMOTIL) 2.5-0.025 MG per tablet 2 tablet  2 tablet Oral TID AC & HS Leighton Ruff, MD   2 tablet at 09/08/15 [phone]  . ertapenem (INVANZ) 1 g in sodium chloride 0.9 % 50 mL IVPB  1 g Intravenous G40N Leighton Ruff, MD   1 g at 09/08/15 0272  . ferrous sulfate tablet 325 mg  325 mg Oral TID WC Michael Boston, MD      . folic acid (FOLVITE) tablet 1 mg  1 mg Oral Daily Leighton Ruff, MD   1 mg at 09/08/15 1033  . guaiFENesin-dextromethorphan (ROBITUSSIN DM) 100-10 MG/5ML syrup 15 mL  15 mL Oral Q4H PRN Ulyses Amor, PA-C      . loperamide (IMODIUM) capsule 4 mg  4 mg Oral TID AC & HS Michael Boston, MD      . LORazepam (ATIVAN) tablet 0.5 mg  0.5 mg Oral q morning - 53G Leighton Ruff, MD   0.5 mg at 09/08/15 1033  . morphine 2 MG/ML injection 2-5 mg  2-5 mg Intravenous Q1H PRN Ulyses Amor, PA-C   2 mg at 09/06/15 1030  . ondansetron (ZOFRAN-ODT) disintegrating tablet 4 mg  4 mg Oral U4Q PRN Leighton Ruff, MD       Or  . ondansetron [facility]) injection 4 mg  4 mg Intravenous I3K PRN Leighton Ruff, MD   4 mg at 09/06/15 0054  . Opium 10 MG/ML (1%) tincture 20 mg  2 mL Oral Q6H Michael Boston, MD      . oxyCODONE (Oxy IR/ROXICODONE) immediate release tablet 5 mg  5 mg Oral V4Q PRN Leighton Ruff, MD   5 mg at 09/07/15 1536  . oxyCODONE-acetaminophen (PERCOCET/ROXICET) 5-325 MG per tablet 1-2 tablet  1-2 tablet Oral Q4H PRN Ulyses Amor, PA-C   2 tablet at 09/07/15 2011  .  pantoprazole (PROTONIX) EC tablet 40 mg  40 mg Oral QHS Leighton Ruff, MD   40 mg at 09/07/15 2158  . phenol (CHLORASEPTIC) mouth spray 1 spray  1 spray Mouth/Throat PRN Ulyses Amor, PA-C      . polycarbophil (FIBERCON) tablet 625 mg  625 mg Oral Daily Michael Boston, MD      . potassium chloride SA (K-DUR,KLOR-CON) CR tablet 20-40 mEq  20-40 mEq Oral Daily PRN Ulyses Amor, PA-C      . sertraline (ZOLOFT) tablet 100 mg  100 mg Oral q morning - 76B Leighton Ruff, MD   341 mg at 09/08/15 1033  . sodium chloride flush (NS) 0.9 % injection 10-40 mL  10-40 mL Intracatheter PRN Leighton Ruff, MD   10 mL at 09/08/15 0550  . vitamin B-12 (CYANOCOBALAMIN) tablet 100 mcg  100 mcg Oral Daily Leighton Ruff, MD   937 mcg at 09/08/15 1033     Allergies  Allergen Reactions  . Codeine Other (See Comments)    "hyper"    Disposition: 06-Home-Health Care Svc  Discharge Instructions    Call MD for:  extreme fatigue    Complete by:  As directed      Call MD for:  hives    Complete by:  As directed      Call MD for:  persistant nausea and vomiting    Complete by:  As directed      Call MD for:  redness, tenderness, or signs of infection (pain, swelling, redness, odor or green/yellow discharge around incision site)    Complete by:  As directed      Call MD for:  severe uncontrolled pain    Complete by:  As directed      Call MD for:    Complete by:  As directed   Temperature > 101.43F     Diet - low sodium heart healthy    Complete by:  As directed   Start with bland, low residue diet for a few days, then advance to a heart healthy (low fat, high fiber) diet.  If you feel nauseated or constipated, simplify to a liquid only diet for 48 hours until you are feeling better (no more nausea, farting/passing gas, having a bowel movement, etc...).  If you cannot tolerate even drinking liquids, or feeling worse, let your surgeon know or go to the Emergency Department for help.     Discharge instructions     Complete by:  As directed   Please see discharge instruction sheets.   Also refer to any handouts/printouts that may have been given from the [facility] surgery office (if you visited [place] there before surgery) Please call our office if you have any questions or concerns [phone]     Discharge wound care:    Complete by:  As directed   If you have closed incisions: Shower and bathe over these incisions with soap and water every day.  It is OK to wash over the dressings: they are waterproof. Remove all surgical dressings on postoperative day #3.  You do not need to replace dressings over the closed incisions unless you feel more comfortable with a Band-Aid covering it.   If you have an open wound: That requires packing, so please see wound care instructions.   In general, remove all dressings, wash wound with soap and water and then replace with saline moistened gauze.  Do the dressing change at least every day.    Please call our office [phone] if you have further questions.     Driving Restrictions    Complete by:  As directed   No driving until off narcotics and can safely swerve away without pain during an emergency     Increase activity slowly    Complete by:  As directed   Walk an hour a day.  Use 20-30 minute walks.  When you can walk 30 minutes without difficulty, it is fine to restart low impact/moderate activities such as biking, jogging, swimming, sexual activity, etc.  Eventually you can increase to unrestricted activity when not feeling pain.  If you feel pain: STOP!Marland Kitchen   Let pain protect you from overdoing it.  Use ice/heat & over-the-counter pain medications to help minimize soreness.  If that is not enough, then use your narcotic pain prescription as needed to remain active.  It is better to take extra pain medications and be more active than to stay bedridden to avoid all pain medications.     Lifting restrictions    Complete by:  As directed   Avoid heavy lifting initially,  <20 pounds at first.   Do not push through pain.   You have no specific weight limit: If it hurts to do, DON'T DO IT.    If you feel no pain, you are not injuring anything.  Pain will protect you from injury.   Coughing and sneezing are far more stressful to your incision than any lifting.   Avoid resuming heavy lifting (>50 pounds) or other intense activity until off all narcotic pain medications.   When want to exercise more, give yourself 2 weeks to gradually get back to full intense exercise/activity.     May shower / Bathe    Complete by:  As directed   [place].  It is fine for dressings or wounds to be washed/rinsed.  Use gentle soap & water.  This will help the incisions and/or wounds get clean & minimize infection.     May walk up steps    Complete by:  As directed      Sexual Activity Restrictions    Complete by:  As directed   Sexual activity as tolerated.  Do not push through pain.  Pain will protect you from injury.     Walk with assistance    Complete by:  As directed   Walk over an hour a day.  May use a walker/cane/companion to help with balance and stamina.            Medication List    STOP taking these medications        loperamide 2 MG capsule  Commonly known as:  IMODIUM      TAKE these medications        amoxicillin-clavulanate 875-125 MG tablet  Commonly known as:  AUGMENTIN  Take 1 tablet by mouth 2 (two) times daily.     apixaban 5 MG Tabs tablet  Commonly known as:  ELIQUIS  Take 1 tablet (5 mg total) by mouth 2 (two) times daily.     cholestyramine 4 g packet  Commonly known as:  QUESTRAN  Take 1 packet (4 g total) by mouth daily.     cyanocobalamin 1000 MCG tablet  Take 0.5 tablets (500 mcg total) by mouth daily.     diphenoxylate-atropine 2.5-0.025 MG tablet  Commonly known as:  LOMOTIL  Take 2 tablets by mouth 4 (four) times daily -  before meals and at bedtime.     ferrous sulfate 325 (65 FE) MG tablet  Take 1 tablet (325 mg  total) by mouth 3 (three) times daily with meals.     folic acid 1 MG tablet  Commonly known as:  FOLVITE  Take 1 tablet (1 mg total) by mouth daily.  LORazepam 0.5 MG tablet  Commonly known as:  ATIVAN  Take 0.5 mg by mouth every morning.     ondansetron 4 MG tablet  Commonly known as:  ZOFRAN  Take 1 tablet (4 mg total) by mouth every 6 (six) hours as needed for nausea.     Opium 10 MG/ML (1%) Tinc  Take 2 mLs (20 mg total) by mouth 4 (four) times daily.     oxyCODONE 5 MG immediate release tablet  Commonly known as:  Oxy IR/ROXICODONE  Take 1 tablet (5 mg total) by mouth every 4 (four) hours as needed for moderate pain.     pantoprazole 40 MG tablet  Commonly known as:  PROTONIX  Take 1 tablet (40 mg total) by mouth at bedtime.     polycarbophil 625 MG tablet  Commonly known as:  FIBERCON  Take 1 tablet (625 mg total) by mouth daily.     sertraline 100 MG tablet  Commonly known as:  ZOLOFT  Take one tablet by mouth every morning.           Follow-up Information    Follow up with Tinnie Gens, MD In 2 weeks.   Specialties:  Vascular Surgery, Interventional Cardiology, Cardiology   Why:  Office will call you to arrange your appt (sent), To follow up after your operation.  Check on your leg circulation.  , To have your wound re-checked   Contact information:   [place] [address] [phone]       Follow up with [facility].   Why:  HHPT, HHRN   Contact information:   [address] [place] [address] [phone]       Follow up with [facility].   Why:  will need rolling walker at discharge   Contact information:   [address] [place] [address] [phone]       Follow up with Rosario Adie., MD. Schedule an appointment as soon as possible for a visit in 10 days.   Specialty:  General Surgery   Why:  To follow up after your hospital stay   Contact information:   [place] [address] [phone]        Signed: Morton Peters, M.D., F.A.C.S. Gastrointestinal and Minimally Invasive Surgery [facility] [address] [place],  [address] [phone] Main / Paging   09/08/2015, 11:21 AM

## 2015-09-08 NOTE — Progress Notes (Addendum)
PT is recommending a RW. Met with pt at bedside. Discussed DME. Contacted Merry Proud at [facility] for DME referral.

## 2015-09-11 ENCOUNTER — Telehealth: Payer: Self-pay | Admitting: Vascular Surgery

## 2015-09-11 ENCOUNTER — Other Ambulatory Visit: Payer: Self-pay | Admitting: General Surgery

## 2015-09-11 DIAGNOSIS — N739 Female pelvic inflammatory disease, unspecified: Secondary | ICD-10-CM

## 2015-09-11 LAB — ANAEROBIC CULTURE

## 2015-09-11 NOTE — Telephone Encounter (Signed)
-----   Message from Gabriel Earing, [place] sent at 09/10/2015 12:06 PM EDT ----- Regarding: RE: schedule She is to f/u with Dr. Donnetta Hutching in 3 weeks with CTA.  I just confirmed with Dr. Donnetta Hutching.  Thanks, Aldona Bar   ----- Message -----    From: Georgiann Mccoy    Sent: 09/07/2015   3:48 PM      To: Gabriel Earing, PA-C Subject: FW: schedule                                   Hi Eustace Moore and I are looking for clarification on the situation below.  Thank you, Selinda Eon  ----- Message -----    From: Mena Goes, RN    Sent: 09/07/2015   3:40 PM      To: Georgiann Mccoy Subject: RE: schedule                                   Send this to [facility] for clarification, I confused too ----- Message -----    From: Georgiann Mccoy    Sent: 09/07/2015   1:44 PM      To: Mena Goes, RN Subject: RE: schedule                                   I received another message to sch pt w/ TFE in 3 weeks with CTA s/p this surgery.  Should I then not sch the appt with [facility]?  ----- Message -----    From: Mena Goes, RN    Sent: 09/07/2015   1:24 PM      To: [facility] Admin Pool Subject: schedule                                         ----- Message -----    From: Gabriel Earing, PA-C    Sent: 09/07/2015   8:46 AM      To: [facility] Charge Pool  S/p THROMBECTOMY of left-to-right femoral-femoral bypass via right inguinal approach 09/05/15  F/u with Dr. Kellie Simmering in 2 weeks.  Thanks [employer]

## 2015-09-11 NOTE — Telephone Encounter (Signed)
-----   Message from Mena Goes, RN sent at 09/07/2015  1:14 PM EDT ----- Regarding: schedule   ----- Message -----    From: Ulyses Amor, PA-C    Sent: 09/07/2015   7:50 AM      To: [facility]vs Charge Pool  F/U with Dr. Donnetta Hutching has CT planned in 3 weeks needs ABI's also S/P revision thrombectomy of fem-fem graft by Dr. Kellie Simmering

## 2015-09-11 NOTE — Telephone Encounter (Signed)
Spoke to pt, sched ABI for 09/18/15 at 1.

## 2015-09-12 ENCOUNTER — Ambulatory Visit
Admission: RE | Admit: 2015-09-12 | Discharge: 2015-09-12 | Disposition: A | Discharge status: Home / Self Care | Payer: BLUE CROSS/BLUE SHIELD | Source: Ambulatory Visit | Attending: General Surgery | Admitting: General Surgery

## 2015-09-12 DIAGNOSIS — N739 Female pelvic inflammatory disease, unspecified: Secondary | ICD-10-CM

## 2015-09-12 HISTORY — PX: IR GENERIC HISTORICAL: IMG1180011

## 2015-09-12 MED ORDER — IOPAMIDOL (ISOVUE-300) INJECTION 61%
125.0000 mL | Freq: Once | INTRAVENOUS | Status: AC | PRN
  Administered 2015-09-12: 125 mL via INTRAVENOUS

## 2015-09-12 NOTE — Progress Notes (Signed)
Referring Physician(s): Thomas,Alicia  Chief Complaint: The patient is seen in follow up today s/p   Successful placement of a 19 French percutaneous drainage catheter via a right trans gluteal approach 09/05/15   History of present illness:   INDICATION: 55 year old female status post LAR with loop ileostomy and ureteral stent placement for distal colonic stricture following cervical cancer resection and radiation therapy. A fluid and gas collection is present in the deep anatomic pelvis adjacent to the rectum. Barium enema demonstrates an anastomotic leak.  Drain placed 09/05/15 Output has been minimal x 3 days; serous/bloody (unchanged since placement) Flushing daily with 5 cc sterile saline Denies pain with flushing Taking Augmentin BID Follows with Dr Elmo Putt Thomas---was seen in office 5/2 Was told all looked well with drain and exam---plan was for CT and drain injection with Radiology today      Past Medical History  Diagnosis Date  . Hypertension   . Hyperlipidemia   . Asthma     pt brought her proair inhaler 06-06-14  . Colon polyp     hyperplastic  . Colon stricture ([place])   . Colonic obstruction ([place])   . Depression   . History of hiatal hernia   . Family history of adverse reaction to anesthesia     pts mother experiences nausea and vomiting   . Shortness of breath dyspnea     seasonal   . Anemia   . GERD (gastroesophageal reflux disease)   . Migraine     "q couple years maybe" (08/07/2015)  . Anxiety   . Acute renal failure (ARF) ([place]) 08/07/2015  . 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 Cervical cancer [facility])     Past Surgical History  Procedure Laterality Date  . Colonoscopy    . Upper gastrointestinal endoscopy    . Tubal ligation    . Colon resection N/A 06/27/2015    Procedure: LYSIS OF ADHESIONS LOW ANTERIOR RESECTION DIVERTING LOOP ILEOSTOMY;  Surgeon: Leighton Ruff, MD;  Location: [facility];  Service: General;  Laterality: N/A;  .  Diverting ileostomy N/A 06/27/2015    Procedure: DIVERTING LOOP ILEOSTOMY;  Surgeon: Leighton Ruff, MD;  Location: [facility];  Service: General;  Laterality: N/A;  . Cystoscopy with stent placement Bilateral 06/27/2015    Procedure: CYSTOSCOPY WITH CATHETER  PLACEMENT;  Surgeon: Franchot Gallo, MD;  Location: [facility];  Service: Urology;  Laterality: Bilateral;  . Colon surgery    . Abdominal hysterectomy  1994  . Peripheral vascular catheterization N/A 08/09/2015    Procedure: Abdominal Aortogram w/Lower Extremity;  Surgeon: Conrad Bokoshe, MD;  Location: [facility];  Service: Cardiovascular;  Laterality: N/A;  . Peripheral vascular catheterization Left 08/09/2015    Procedure: Peripheral Vascular Intervention;  Surgeon: Conrad Lima, MD;  Location: [facility];  Service: Cardiovascular;  Laterality: Left;  common iliac  . Femoral-femoral bypass graft Bilateral 08/10/2015    Procedure: BYPASS GRAFT LEFT FEMORALTO RIGHT FEMORAL ARTERY;  Surgeon: Rosetta Posner, MD;  Location: [facility];  Service: Vascular;  Laterality: Bilateral;  . Thrombectomy iliac artery Right 08/10/2015    Procedure: THROMBECTOMY Right ILIAC ARTERY;  Surgeon: Rosetta Posner, MD;  Location: [facility];  Service: Vascular;  Laterality: Right;  . Intraoperative arteriogram Right 08/10/2015    Procedure: INTRA OPERATIVE ARTERIOGRAM Right Iliac Artery;  Surgeon: Rosetta Posner, MD;  Location[facility] OR;  Service: Vascular;  Laterality: Right;  . Thrombectomy femoral artery Bilateral 09/05/2015    Procedure:  THROMBECTOMY Left to Right Femoral to  FEMORAL ARTERY Bypass,.;  Surgeon: Mal Misty, MD;  Location: [facility] OR;  Service: Vascular;  Laterality: Bilateral;    Allergies: Codeine  Medications: Prior to Admission medications   Medication Sig Start Date End Date Taking? Authorizing Provider  amoxicillin-clavulanate (AUGMENTIN) 875-125 MG tablet Take 1 tablet by mouth 2 (two) times daily. 09/08/15   Michael Boston, MD  apixaban (ELIQUIS) 5 MG  TABS tablet Take 1 tablet (5 mg total) by mouth 2 (two) times daily. 08/08/15   Debbe Odea, MD  cholestyramine (QUESTRAN) 4 g packet Take 1 packet (4 g total) by mouth daily. 08/25/15   Maryann Mikhail, DO  diphenoxylate-atropine (LOMOTIL) 2.5-0.025 MG tablet Take 2 tablets by mouth 4 (four) times daily -  before meals and at bedtime. 08/04/15   Michael Boston, MD  ferrous sulfate 325 (65 FE) MG tablet Take 1 tablet (325 mg total) by mouth 3 (three) times daily with meals. 09/08/15   Michael Boston, MD  folic acid (FOLVITE) 1 MG tablet Take 1 tablet (1 mg total) by mouth daily. 08/04/15   Debbe Odea, MD  LORazepam (ATIVAN) 0.5 MG tablet Take 0.5 mg by mouth every morning.    Historical Provider, MD  ondansetron (ZOFRAN) 4 MG tablet Take 1 tablet (4 mg total) by mouth every 6 (six) hours as needed for nausea. 08/25/15   Maryann Mikhail, DO  Opium 10 MG/ML (1%) TINC Take 2 mLs (20 mg total) by mouth 4 (four) times daily. 09/08/15   Michael Boston, MD  oxyCODONE (OXY IR/ROXICODONE) 5 MG immediate release tablet Take 1 tablet (5 mg total) by mouth every 4 (four) hours as needed for moderate pain. 08/11/15   Ulyses Amor, PA-C  pantoprazole (PROTONIX) 40 MG tablet Take 1 tablet (40 mg total) by mouth at bedtime. 08/25/15   Maryann Mikhail, DO  polycarbophil (FIBERCON) 625 MG tablet Take 1 tablet (625 mg total) by mouth daily. 09/08/15   Michael Boston, MD  sertraline (ZOLOFT) 100 MG tablet Take one tablet by mouth every morning. 02/19/15   Historical Provider, MD  vitamin B-12 1000 MCG tablet Take 0.5 tablets (500 mcg total) by mouth daily. 08/04/15   Debbe Odea, MD     Family History  Problem Relation Age of Onset  . Colon cancer Neg Hx   . Stomach cancer Neg Hx   . Esophageal cancer Neg Hx   . Deep vein thrombosis Neg Hx   . Rectal cancer Cousin   . Hypertension Mother   . Hyperparathyroidism Mother   . Hypertension Father   . CAD Father     CABG  . Peripheral vascular disease Father   . Graves' disease  Father     Social History   Social History  . Marital Status: Married    Spouse Name: N/A  . Number of Children: 1  . Years of Education: N/A   Social History Main Topics  . Smoking status: Former Smoker -- 1.00 packs/day for 40 years    Types: Cigarettes    Quit date: 05/09/2015  . Smokeless tobacco: Never Used  . Alcohol Use: No  . Drug Use: No  . Sexual Activity: Yes   Other Topics Concern  . Not on file   Social History Narrative     Vital Signs: T: 98.7 BP: 137/66 P: 78 O2 sat: 98% RA  Physical Exam  Abdominal: Soft. Bowel sounds are normal.  Skin: Skin is warm and dry.  Site is clean  and dry; no bleeding; NT No sign of infection  Injection performed of drain  Nursing note and vitals reviewed.   Imaging: No results found.  Labs:  CBC:  Recent Labs  09/05/15 0455 09/05/15 1904 09/06/15 0107 09/07/15 0454 09/08/15 0555  WBC 7.8  --  25.8* 10.7* 8.5  HGB 8.4* 10.9* 9.9* 8.7* 8.9*  HCT 26.8* 32.0* 30.6* 27.6* 28.7*  PLT 240  --  171 175 190    COAGS:  Recent Labs  08/09/15 0400 08/10/15 0101  09/04/15 1053 09/04/15 2100 09/06/15 0107 09/07/15 0454  INR 1.25 1.17  --  1.05  --  1.38  --   APTT 69*  --   < > 28 35 87* 59*  < > = values in this interval not displayed.  BMP:  Recent Labs  09/03/15 0529 09/05/15 1904 09/06/15 0107 09/07/15 0454 09/08/15 0555  NA 134* 139 136 135 141  K 4.1 3.7 4.1 3.4* 3.5  CL 102  --  108 106 112*  CO2 21*  --  18* 20* 23  GLUCOSE 120* 130* 106* 103* 91  BUN 15  --  6 8 <5*  CALCIUM 9.1  --  7.4* 7.3* 8.0*  CREATININE 0.94  --  1.02* 0.82 0.70  GFRNONAA >60  --  >60 >60 >60  GFRAA >60  --  >60 >60 >60    LIVER FUNCTION TESTS:  Recent Labs  08/09/15 0400 08/10/15 0101 08/23/15 0321 08/24/15 0745  BILITOT 0.3 0.4 0.6 0.4  AST 10* 14* 13* 13*  ALT 9* 12* 12* 12*  ALKPHOS 124 137* 189* 158*  PROT 5.2* 5.5* 6.5 5.9*  ALBUMIN 2.4* 2.6* 3.3* 3.0*    Assessment:  Cervical Ca  --resection and radiation Fluid collection deep anatomic pelvis adjacent to rectum Drain placed 09/05/15 CT today reveals resolution of collection per Dr Anselm Pancoast Injection of drain shows no communication to rectum per Dr Anselm Pancoast Removal of drain without complication Plan for pt to follow with Dr Leighton Ruff  Signed: Monia Sabal A 09/12/2015, 9:48 AM   Please refer to Dr. Anselm Pancoast attestation of this note for management and plan.

## 2015-09-18 ENCOUNTER — Other Ambulatory Visit: Payer: Self-pay | Admitting: *Deleted

## 2015-09-18 ENCOUNTER — Ambulatory Visit (HOSPITAL_COMMUNITY)
Admission: RE | Admit: 2015-09-18 | Discharge: 2015-09-18 | Disposition: A | Discharge status: Home / Self Care | Payer: BLUE CROSS/BLUE SHIELD | Source: Ambulatory Visit | Attending: Vascular Surgery | Admitting: Vascular Surgery

## 2015-09-18 ENCOUNTER — Encounter: Payer: Self-pay | Admitting: Vascular Surgery

## 2015-09-18 ENCOUNTER — Ambulatory Visit (INDEPENDENT_AMBULATORY_CARE_PROVIDER_SITE_OTHER): Payer: BLUE CROSS/BLUE SHIELD | Admitting: Vascular Surgery

## 2015-09-18 DIAGNOSIS — I10 Essential (primary) hypertension: Secondary | ICD-10-CM | POA: Insufficient documentation

## 2015-09-18 DIAGNOSIS — I739 Peripheral vascular disease, unspecified: Secondary | ICD-10-CM

## 2015-09-18 DIAGNOSIS — K219 Gastro-esophageal reflux disease without esophagitis: Secondary | ICD-10-CM | POA: Diagnosis not present

## 2015-09-18 DIAGNOSIS — R0989 Other specified symptoms and signs involving the circulatory and respiratory systems: Secondary | ICD-10-CM | POA: Diagnosis present

## 2015-09-18 DIAGNOSIS — E785 Hyperlipidemia, unspecified: Secondary | ICD-10-CM | POA: Diagnosis not present

## 2015-09-18 DIAGNOSIS — R938 Abnormal findings on diagnostic imaging of other specified body structures: Secondary | ICD-10-CM | POA: Diagnosis not present

## 2015-09-18 DIAGNOSIS — I70219 Atherosclerosis of native arteries of extremities with intermittent claudication, unspecified extremity: Secondary | ICD-10-CM

## 2015-09-18 NOTE — Progress Notes (Signed)
Patient resents today for removal of her nylon sutures from recent thrombectomy of her left right femorofemoral bypass with Dr. Kellie Simmering. She had been diagnosed with a pelvic abscess and had the trans-gluteal drainage of this and interventional radiology. She was lying on her abdomen for 1 hour at the completion of the IR procedure had profoundly ischemic right leg. She underwent uneventful thrombectomy. She actually looks quite good today. Her groin incision is well-healed and her sutures are removed. 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 Her most recent CT scan showed that this was still present but smaller than her initial CT scan. She was scheduled to see me in approximately 2 weeks. We have postpone this until June due to her recent event and recent CT evaluating her pelvic abscess and high output ileostomy. We will see her again in early June with CT of her aorta for continued follow-up of this mural thrombus.

## 2015-09-18 NOTE — Discharge Summary (Signed)
[facility] Discharge Summary   Patient ID:  Cathy Jones MRN: [id number] DOB/AGE: Sep 10, 1960 55 y.o.  Admit date: 08/07/2015 Discharge date: 08/12/2015 Date of Surgery: 08/07/2015 - 08/10/2015 Surgeon: Surgeon(s): Rosetta Posner, MD Mal Misty, MD  Admission Diagnosis: Arterial occlusion [facility]) [I74.9] AKI (acute kidney injury) [facility]) [N17.9]  Discharge Diagnoses:  Arterial occlusion ([place]) [I74.9] AKI (acute kidney injury) ([place]) [N17.9]  Secondary Diagnoses: Past Medical History  Diagnosis Date  . Hypertension   . Hyperlipidemia   . Asthma     pt brought her proair inhaler 06-06-14  . Colon polyp     hyperplastic  . Colon stricture ([place])   . Colonic obstruction ([place])   . Depression   . History of hiatal hernia   . Family history of adverse reaction to anesthesia     pts mother experiences nausea and vomiting   . Shortness of breath dyspnea     seasonal   . Anemia   . GERD (gastroesophageal reflux disease)   . Migraine     "q couple years maybe" (08/07/2015)  . Anxiety   . Acute renal failure (ARF) ([place]) 08/07/2015  . 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 Cervical cancer (H[facility])     Procedure(s): BYPASS GRAFT LEFT FEMORALTO RIGHT FEMORAL ARTERY THROMBECTOMY Right ILIAC ARTERY INTRA OPERATIVE ARTERIOGRAM Right Iliac Artery  Discharged Condition: good  HPI: This is a 55 year old female who presented to the ER today with complaints of right leg numbness from the knee down. She states that this has been present for about 8 days. 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 She was started on anticoagulation and had a follow up with Dr Donnetta Hutching next week. She had a duplex today which showed an occluded right iliac artery  She has a history of cervical cancer and is s/p XRT and TAH. 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 She was admitted on 07-31-2015 with abdominal pain and nausea and vomiting with acute renal failure llikly from high output ostomy. She was started on imodium.  She has a history of hypertension, hypercholesterolemia, managed with a statin and asthma  08/09/2015 Dr. Bridgett Larsson  PROCEDURE: 1. Left common femoral artery cannulation under ultrasound guidance 2. Placement of catheter in aorta 3. Aortogram 4. Conscious sedation for 61 minutes 5. Angioplasty and stenting left common iliac artery (iCAST 7 mm x 22 mm) 6. Bilateral leg runoff via catheter  PRE-OPERATIVE DIAGNOSIS: right iliac artery occlusion, right leg ischemia  Hospital Course:  Cathy Jones is a 55 y.o. female is S/P  Procedure(s): by Dr. Sherren Mocha Early [place] Right ILIAC ARTERY INTRA OPERATIVE ARTERIOGRAM Right Iliac Artery  Consults:  Treatment Team:  Rosetta Posner, MD Nita Sells, MD Leighton Ruff, MD  POD#1 Doppler biphasic DP/PT bilaterally Incisions bilateral groins soft without hematoma 4 X 4 to groins Lungs non labored breathing Heart RRR  Assessment/Planning: UO 650 in the past 8 hour shift Cr 0.83 HGB 7.8 asymptomatic. I will redraw labs in the am. If she becomes symptomatic or her hgb drops we will transfuse her tomorrow. Disposition stable vascular by pass will restart Eliquis today  Possible discharge tomorrow  POD#2 Groin incisions healing with no erythema and no hematoma. Both feet well-perfused. No swelling noted in  right foot. Excellent biphasic dorsalis pedis and posterior tibial flow in the right and left foot. Actually better in the right than the left.  Assessment/Plan: s/p Procedure(s): BYPASS GRAFT LEFT FEMORALTO RIGHT FEMORAL ARTERY (Bilateral) THROMBECTOMY Right ILIAC ARTERY (Right) INTRA OPERATIVE ARTERIOGRAM Right Iliac Artery (Right) Stable overall. Okay for  discharge. Explained that this numbness is related to approximately 2 weeks of moderate to severe ischemia in her right foot. Explained this is related to peripheral nerve injury. Explained there is no specific treatment and this should resolve with time. We'll continue to monitor this as an outpatient. Will be discharged home today and will resume oral anticoagulation.  Significant Diagnostic Studies: CBC Lab Results  Component Value Date   WBC 8.5 09/08/2015   HGB 8.9* 09/08/2015   HCT 28.7* 09/08/2015   MCV 82.2 09/08/2015   PLT 190 09/08/2015    BMET    Component Value Date/Time   NA 141 09/08/2015 0555   K 3.5 09/08/2015 0555   CL 112* 09/08/2015 0555   CO2 23 09/08/2015 0555   GLUCOSE 91 09/08/2015 0555   BUN <5* 09/08/2015 0555   CREATININE 0.70 09/08/2015 0555   CALCIUM 8.0* 09/08/2015 0555   GFRNONAA >60 09/08/2015 0555   GFRAA >60 09/08/2015 0555   COAG Lab Results  Component Value Date   INR 1.38 09/06/2015   INR 1.05 09/04/2015   INR 1.17 08/10/2015     Disposition:  Discharge to :Home Discharge Instructions    Call MD for:  redness, tenderness, or signs of infection (pain, swelling, bleeding, redness, odor or green/yellow discharge around incision site)    Complete by:  As directed      Call MD for:  severe or increased pain, loss or decreased feeling  in affected limb(s)    Complete by:  As directed      Call MD for:  temperature >100.5    Complete by:  As directed      Discharge instructions    Complete by:  As directed   You may shower and get the groin incisions wet.  Dry well and keep clean and dry throughout the day.     Increase activity slowly    Complete by:  As directed   Walk with assistance use walker or cane as needed     Resume previous diet    Complete by:  As directed             Medication List    STOP taking these medications        atorvastatin 10 MG tablet  Commonly known as:  LIPITOR     ferrous sulfate 325 (65 FE) MG  tablet     loperamide 2 MG capsule  Commonly known as:  IMODIUM     nystatin powder  Generic drug:  nystatin     paregoric 2 MG/5ML solution     polycarbophil 625 MG tablet  Commonly known as:  FIBERCON     PROAIR HFA 108 (90 Base) MCG/ACT inhaler  Generic drug:  albuterol      TAKE these medications        apixaban 5 MG Tabs tablet  Commonly known as:  ELIQUIS  Take 1 tablet (5 mg total) by mouth 2 (two) times daily.     cyanocobalamin 1000 MCG tablet  Take 0.5 tablets (500 mcg total) by mouth daily.     diphenoxylate-atropine 2.5-0.025 MG tablet  Commonly known as:  LOMOTIL  Take 2 tablets by mouth  4 (four) times daily -  before meals and at bedtime.     folic acid 1 MG tablet  Commonly known as:  FOLVITE  Take 1 tablet (1 mg total) by mouth daily.     LORazepam 0.5 MG tablet  Commonly known as:  ATIVAN  Take 0.5 mg by mouth every morning.     oxyCODONE 5 MG immediate release tablet  Commonly known as:  Oxy IR/ROXICODONE  Take 1 tablet (5 mg total) by mouth every 4 (four) hours as needed for moderate pain.     sertraline 100 MG tablet  Commonly known as:  ZOLOFT  Take one tablet by mouth every morning.       Verbal and written Discharge instructions given to the patient. Wound care per Discharge AVS   Signed: Laurence Slate [facility] 09/18/2015, 9:14 AM  - For VQI Registry use --- Instructions: Press F2 to tab through selections.  Delete question if not applicable.   Post-op:  Wound infection: No  Graft infection: No  Transfusion: No  If yes, 0 units given New Arrhythmia: No Ipsilateral amputation: [x ] no, [ ]  Minor, [ ]  BKA, [ ]  AKA Discharge patency: [x ] Primary, [ ]  Primary assisted, [ ]  Secondary, [ ]  Occluded Patency judged by: [x ] Dopper only, [ ]  Palpable graft pulse, [ ]  Palpable distal pulse, [ ]  ABI inc. > 0.15, [ ]  Duplex  D/C Ambulatory Status: Ambulatory  Complications: MI: [x ] No, [ ]  Troponin only, [ ]  EKG or Clinical CHF:  No Resp failure: [x ] none, [ ]  Pneumonia, [ ]  Ventilator Chg in renal function: [x ] none, [ ]  Inc. Cr > 0.5, [ ]  Temp. Dialysis, [ ]  Permanent dialysis Stroke: [x ] None, [ ]  Minor, [ ]  Major Return to OR: No  Reason for return to OR: [ ]  Bleeding, [ ]  Infection, [ ]  Thrombosis, [ ]  Revision  Discharge medications: Statin use:  No  for medical reason   ASA use:  No  for medical reason   Plavix use:  No  for medical reason   Beta blocker use: No  for medical reason   Coumadin use: No  for medical reason    On Eliquis

## 2015-09-23 ENCOUNTER — Telehealth: Payer: Self-pay | Admitting: Surgery

## 2015-09-23 NOTE — Telephone Encounter (Signed)
Cathy Jones has had a complicated course after a LAR from 06/27/2015.  She has an ileostomy.  She was on antibiotics.  Her last dose was 09/21/2015.  She was under the impression the antibiotics are for her ileostomy output, though I think that it was for the pelvic abscess that she had.  I did not renew her antibiotics, but asked her to call our office on Monday, 5/15, to clarify whether she needs antibiotics.  Alphonsa Overall, MD, [facility] Pager: [phone] Office phone:  [phone]

## 2015-10-02 ENCOUNTER — Other Ambulatory Visit: Payer: Self-pay

## 2015-10-09 ENCOUNTER — Ambulatory Visit: Payer: Self-pay | Admitting: Vascular Surgery

## 2015-10-16 ENCOUNTER — Ambulatory Visit
Admission: RE | Admit: 2015-10-16 | Discharge: 2015-10-16 | Disposition: A | Discharge status: Home / Self Care | Payer: BLUE CROSS/BLUE SHIELD | Source: Ambulatory Visit | Attending: Vascular Surgery | Admitting: Vascular Surgery

## 2015-10-16 ENCOUNTER — Telehealth: Payer: Self-pay | Admitting: *Deleted

## 2015-10-16 DIAGNOSIS — I7410 Embolism and thrombosis of unspecified parts of aorta: Secondary | ICD-10-CM

## 2015-10-16 DIAGNOSIS — I70219 Atherosclerosis of native arteries of extremities with intermittent claudication, unspecified extremity: Secondary | ICD-10-CM

## 2015-10-16 MED ORDER — IOPAMIDOL (ISOVUE-370) INJECTION 76%
100.0000 mL | Freq: Once | INTRAVENOUS | Status: AC | PRN
  Administered 2015-10-16: 100 mL via INTRAVENOUS

## 2015-10-16 NOTE — Telephone Encounter (Signed)
At Dr. Luther Parody request, I called patient. After Dr. Donnetta Hutching reviewed her CTA done this morning, the following was relayed to patient:  The clot has resolved and the arterial BPG looks very good. It is open. There is a pocket of fluid in her abdomen and Dr. Donnetta Hutching is going to contact Dr. Leighton Ruff to make a decision on what needs to be done about this fluid. They office should be contacting patient after Dr. Marcello Moores has reviewed CTA.   Patient voiced understanding and agreement with contacting Dr. Marcello Moores.

## 2015-10-18 ENCOUNTER — Encounter: Payer: Self-pay | Admitting: Vascular Surgery

## 2015-10-23 ENCOUNTER — Encounter: Payer: Self-pay | Admitting: Vascular Surgery

## 2015-10-23 ENCOUNTER — Ambulatory Visit (INDEPENDENT_AMBULATORY_CARE_PROVIDER_SITE_OTHER): Payer: Self-pay | Admitting: Vascular Surgery

## 2015-10-23 VITALS — BP 128/81 | HR 80 | Temp 98.3°F | Resp 18 | Ht 62.0 in | Wt 184.9 lb

## 2015-10-23 DIAGNOSIS — I70219 Atherosclerosis of native arteries of extremities with intermittent claudication, unspecified extremity: Secondary | ICD-10-CM

## 2015-10-23 DIAGNOSIS — I739 Peripheral vascular disease, unspecified: Secondary | ICD-10-CM

## 2015-10-23 DIAGNOSIS — I7410 Embolism and thrombosis of unspecified parts of aorta: Secondary | ICD-10-CM

## 2015-10-23 NOTE — Progress Notes (Signed)
Patient name: Cathy Jones MRN: [id number] DOB: [birth year] Sex: female  REASON FOR VISIT: Here today for follow-up of extensive peripheral vascular issues. She initially presented with the sagittal finding of thrombus in her supraceliac aorta. She was placed on Eliquis area she had been presented with the severe critical limb ischemia in her left leg and underwent left iliac angioplasty followed by left to right femorofemoral bypass. This was on 08/10/2015. She did reasonably well following this. She developed a pelvic abscess following ileostomy and resection. She underwent CT directed drainage and IR and was in the prone position for over an hour and occluded her bypass and was taken urgently to the operating room by Dr. Kellie Simmering for 26 2017 for thrombectomy. She has done well since that time. She recently had a repeat CT scan which shows complete relation resolution of the area of thrombus in her supraceliac aorta she has no fluid around her bypass.    Current Outpatient Prescriptions  Medication Sig Dispense Refill  . apixaban (ELIQUIS) 5 MG TABS tablet Take 1 tablet (5 mg total) by mouth 2 (two) times daily. 60 tablet 0  . diphenoxylate-atropine (LOMOTIL) 2.5-0.025 MG tablet Take 2 tablets by mouth 4 (four) times daily -  before meals and at bedtime. (Patient taking differently: Take 1 tablet by mouth daily. ) 60 tablet 3  . ferrous sulfate 325 (65 FE) MG tablet Take 1 tablet (325 mg total) by mouth 3 (three) times daily with meals. (Patient taking differently: Take 325 mg by mouth daily. Takes daily with lunch/) 60 tablet 3  . LORazepam (ATIVAN) 0.5 MG tablet Take 0.5 mg by mouth every morning.    . ondansetron (ZOFRAN) 4 MG tablet Take 1 tablet (4 mg total) by mouth every 6 (six) hours as needed for nausea. (Patient taking differently: Take 4 mg by mouth daily as needed for nausea. ) 30 tablet 0  . sertraline (ZOLOFT) 100 MG tablet Take one tablet by mouth  every morning.  0  . vitamin B-12 1000 MCG tablet Take 0.5 tablets (500 mcg total) by mouth daily.    Marland Kitchen amoxicillin-clavulanate (AUGMENTIN) 875-125 MG tablet Take 1 tablet by mouth 2 (two) times daily. (Patient not taking: Reported on 10/23/2015) 14 tablet 1  . cholestyramine (QUESTRAN) 4 g packet Take 1 packet (4 g total) by mouth daily. (Patient not taking: Reported on 10/23/2015) 30 each 0  . folic acid (FOLVITE) 1 MG tablet Take 1 tablet (1 mg total) by mouth daily. (Patient not taking: Reported on 10/23/2015) 30 tablet 0  . Opium 10 MG/ML (1%) TINC Take 2 mLs (20 mg total) by mouth 4 (four) times daily. (Patient not taking: Reported on 10/23/2015) 473 mL 0  . oxyCODONE (OXY IR/ROXICODONE) 5 MG immediate release tablet Take 1 tablet (5 mg total) by mouth every 4 (four) hours as needed for moderate pain. (Patient not taking: Reported on 10/23/2015) 30 tablet 0  . pantoprazole (PROTONIX) 40 MG tablet Take 1 tablet (40 mg total) by mouth at bedtime. (Patient not taking: Reported on 10/23/2015) 30 tablet 0  . polycarbophil (FIBERCON) 625 MG tablet Take 1 tablet (625 mg total) by mouth daily. (Patient not taking: Reported on 10/23/2015) 60 tablet 2   No current facility-administered medications for this visit.       PHYSICAL EXAM: Filed Vitals:   10/23/15 1334  BP: 128/81  Pulse: 80  Temp: 98.3 F (36.8 C)  TempSrc: Oral  Resp: 18  Height: 5\' 2"  (1.575 m)  Weight: 184 lb 14.4 oz (83.87 kg)  SpO2: 98%    GENERAL: The patient is a well-nourished female, in no acute distress. The vital signs are documented above. Her femoral and bilateral incisions are healed quite nicely with no evidence of fluctuance or erythema. She has an easily palpable femorofemoral graft pulse. I do not palpate pedal pulses but she has well-perfused feet bilaterally. She does have some decreased sensation on the bottom of her right foot and normal motor function bilaterally  MEDICAL ISSUES: Stable overall. Most recent  CT scan showed resolution of the thrombus in her aorta. Did show some persistent abscess in her pelvis with barium present. This is being addressed by Dr. Marcello Moores. We will see her again in 3 months for repeat office visit and ankle arm indices. I do not see any indication for continued treatment of anticoagulation and therefore she will discontinue her Eliquis  Rosetta Posner, MD [facility] Tel [phone] Pager [phone]

## 2015-12-03 ENCOUNTER — Other Ambulatory Visit: Payer: Self-pay | Admitting: General Surgery

## 2015-12-03 ENCOUNTER — Inpatient Hospital Stay (HOSPITAL_COMMUNITY)
Admission: AD | Admit: 2015-12-03 | Discharge: 2015-12-06 | DRG: 373 | Disposition: A | Discharge status: Home / Self Care | Payer: BLUE CROSS/BLUE SHIELD | Source: Ambulatory Visit | Attending: General Surgery | Admitting: General Surgery

## 2015-12-03 ENCOUNTER — Encounter (HOSPITAL_COMMUNITY): Payer: Self-pay

## 2015-12-03 DIAGNOSIS — F419 Anxiety disorder, unspecified: Secondary | ICD-10-CM | POA: Diagnosis present

## 2015-12-03 DIAGNOSIS — F17200 Nicotine dependence, unspecified, uncomplicated: Secondary | ICD-10-CM | POA: Diagnosis present

## 2015-12-03 DIAGNOSIS — Z8541 Personal history of malignant neoplasm of cervix uteri: Secondary | ICD-10-CM

## 2015-12-03 DIAGNOSIS — I739 Peripheral vascular disease, unspecified: Secondary | ICD-10-CM | POA: Diagnosis present

## 2015-12-03 DIAGNOSIS — E860 Dehydration: Secondary | ICD-10-CM | POA: Diagnosis present

## 2015-12-03 DIAGNOSIS — Z90710 Acquired absence of both cervix and uterus: Secondary | ICD-10-CM | POA: Diagnosis not present

## 2015-12-03 DIAGNOSIS — E7800 Pure hypercholesterolemia, unspecified: Secondary | ICD-10-CM | POA: Diagnosis present

## 2015-12-03 DIAGNOSIS — Z8249 Family history of ischemic heart disease and other diseases of the circulatory system: Secondary | ICD-10-CM | POA: Diagnosis not present

## 2015-12-03 DIAGNOSIS — Z932 Ileostomy status: Secondary | ICD-10-CM | POA: Diagnosis not present

## 2015-12-03 DIAGNOSIS — Z833 Family history of diabetes mellitus: Secondary | ICD-10-CM

## 2015-12-03 DIAGNOSIS — K651 Peritoneal abscess: Principal | ICD-10-CM | POA: Diagnosis present

## 2015-12-03 DIAGNOSIS — K624 Stenosis of anus and rectum: Secondary | ICD-10-CM | POA: Diagnosis present

## 2015-12-03 DIAGNOSIS — K913 Postprocedural intestinal obstruction, unspecified as to partial versus complete: Secondary | ICD-10-CM

## 2015-12-03 DIAGNOSIS — Z808 Family history of malignant neoplasm of other organs or systems: Secondary | ICD-10-CM

## 2015-12-03 DIAGNOSIS — K219 Gastro-esophageal reflux disease without esophagitis: Secondary | ICD-10-CM | POA: Diagnosis present

## 2015-12-03 DIAGNOSIS — R112 Nausea with vomiting, unspecified: Secondary | ICD-10-CM

## 2015-12-03 LAB — COMPREHENSIVE METABOLIC PANEL
ALK PHOS: 115 U/L (ref 38–126)
ALT: 11 U/L — AB (ref 14–54)
AST: 15 U/L (ref 15–41)
Albumin: 4.7 g/dL (ref 3.5–5.0)
Anion gap: 10 (ref 5–15)
BUN: 17 mg/dL (ref 6–20)
CALCIUM: 9.6 mg/dL (ref 8.9–10.3)
CHLORIDE: 100 mmol/L — AB (ref 101–111)
CO2: 18 mmol/L — AB (ref 22–32)
CREATININE: 1.22 mg/dL — AB (ref 0.44–1.00)
GFR calc Af Amer: 58 mL/min — ABNORMAL LOW (ref >60)
GFR, EST NON AFRICAN AMERICAN: 50 mL/min — AB (ref >60)
Glucose, Bld: 122 mg/dL — ABNORMAL HIGH (ref 65–99)
Potassium: 3.9 mmol/L (ref 3.5–5.1)
Sodium: 128 mmol/L — ABNORMAL LOW (ref 135–145)
Total Bilirubin: 0.7 mg/dL (ref 0.3–1.2)
Total Protein: 7.6 g/dL (ref 6.5–8.1)

## 2015-12-03 LAB — CBC
HCT: 48.7 % — ABNORMAL HIGH (ref 36.0–46.0)
HEMOGLOBIN: 16.5 g/dL — AB (ref 12.0–15.0)
MCH: 27.5 pg (ref 26.0–34.0)
MCHC: 33.9 g/dL (ref 30.0–36.0)
MCV: 81 fL (ref 78.0–100.0)
PLATELETS: 195 10*3/uL (ref 150–400)
RBC: 6.01 MIL/uL — AB (ref 3.87–5.11)
RDW: 14.4 % (ref 11.5–15.5)
WBC: 10.6 10*3/uL — AB (ref 4.0–10.5)

## 2015-12-03 LAB — MAGNESIUM: MAGNESIUM: 1.8 mg/dL (ref 1.7–2.4)

## 2015-12-03 LAB — PHOSPHORUS: Phosphorus: 3.4 mg/dL (ref 2.5–4.6)

## 2015-12-03 MED ORDER — ONDANSETRON HCL 4 MG/2ML IJ SOLN
4.0000 mg | Freq: Four times a day (QID) | INTRAMUSCULAR | Status: DC | PRN
  Administered 2015-12-03 – 2015-12-04 (×2): 4 mg via INTRAVENOUS
  Filled 2015-12-03 (×2): qty 2

## 2015-12-03 MED ORDER — SODIUM CHLORIDE 0.9 % IV SOLN
INTRAVENOUS | Status: DC
  Administered 2015-12-03 – 2015-12-04 (×4): 1000 mL via INTRAVENOUS
  Administered 2015-12-05 – 2015-12-06 (×3): via INTRAVENOUS

## 2015-12-03 MED ORDER — SODIUM CHLORIDE 0.9 % IV SOLN
1.0000 g | INTRAVENOUS | Status: DC
  Administered 2015-12-03 – 2015-12-06 (×4): 1 g via INTRAVENOUS
  Filled 2015-12-03 (×4): qty 1

## 2015-12-03 MED ORDER — FERROUS SULFATE 325 (65 FE) MG PO TABS: 325.0000 mg | ORAL_TABLET | Freq: Every day | ORAL | Status: DC

## 2015-12-03 MED ORDER — LORAZEPAM 0.5 MG PO TABS: 0.5000 mg | ORAL_TABLET | Freq: Every morning | ORAL | Status: DC

## 2015-12-03 MED ORDER — MORPHINE SULFATE (PF) 2 MG/ML IV SOLN
2.0000 mg | INTRAVENOUS | Status: DC | PRN
  Administered 2015-12-05: 2 mg via INTRAVENOUS
  Filled 2015-12-03: qty 1

## 2015-12-03 MED ORDER — SERTRALINE HCL 100 MG PO TABS: 100.0000 mg | ORAL_TABLET | Freq: Every morning | ORAL | Status: DC

## 2015-12-03 MED ORDER — ONDANSETRON 4 MG PO TBDP: 4.0000 mg | ORAL_TABLET | Freq: Four times a day (QID) | ORAL | Status: DC | PRN

## 2015-12-03 MED ORDER — DIPHENOXYLATE-ATROPINE 2.5-0.025 MG PO TABS: 1.0000 | ORAL_TABLET | Freq: Every day | ORAL | Status: DC

## 2015-12-03 NOTE — H&P (Signed)
Cathy Jones. Servais 12/03/2015 10:16 AM Location: [facility] Patient #: [id number] DOB: [birth year] Married / Language: Cathy Jones / Race: White Female  History of Present Illness Leighton Ruff MD; [id number] 10:28 AM) The patient is a 55 year old female who presents with colonic obstruction. Patient is referred by Dr. Scarlette Shorts due to a colonic stricture. She has a history of total abdominal hysterectomy with pelvic lymphadenectomy for cervical cancer in 1994. This was followed with chemotherapy and radiation. She has not had any evidence of recurrence. Colonoscopy revealed rectal stricture. She has had a virtual colonoscopy and CT of the abdomen and pelvis, both of which revealed a short segment stricture in the distal sigmoid colon. She underwent an open low anterior resection with diverting ileostomy in mid February 2017. She was having some high ostomy outputs and we started her on some imodium for this. She was in the hospital multiple times after that due to uncontrollable ileostomy outputs and dehydration. During this time, she developed an aortic clot which embolizes and caused her to undergo emergent vascular surgery, resulting in a Fem-fem bypass. Repeat CT scan performed and showed a small pelvic abscess and most recent CT shows a cavity containing barium but I see no active contrast extravation. Initailly, a drain was placed by interventional radiology. Unfortunately, during this time her L>R Fem-Fem graft went down, and she had to undergo emergent thrombectomy. The drain was removed after a couple weeks, once no more leak was noticed with dye injection. After this we stopped her antibiotics and she developed high output ileostomy once again. I placed her on by mouth Augmentin and these symptoms resolved. She was on this for a month, but her symptoms recurred about 2 weeks after stopping this. It was restarted, and her symptoms initially resolved but have now came back, even on  antibiotics. She is giving herself IV fluids at home to help with her dehydration but has not been able to eat much of anything for the past few days.    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 Migraine Headache Other disease, cancer, significant illness Gastroesophageal Reflux Disease  Past Surgical History Leighton Ruff, MD; [id number] 10:28 AM) Hysterectomy (due to cancer) - Complete Colon Polyp Removal - Colonoscopy LOW ANTERIOR RESECTION (44144) lysis of adhesions low anterior rescection diverting loop ileostomy; cystoscopy with catheter placement Sentinel Lymph Node Biopsy Oral Surgery  Diagnostic Studies History Leighton Ruff, MD; [id number] 10:28 AM) Mammogram within last year Colonoscopy within last year Pap Smear 1-5 years ago  Allergies Elbert Ewings, CMA; 12/03/2015 10:16 AM) Codeine Sulfate *ANALGESICS - OPIOID*  Medication History Elbert Ewings, CMA; 12/03/2015 10:16 AM) Zofran ODT (4MG  Tablet Disint, 1 (one) Tablet Disperse Oral every six hours, as needed, Taken starting 08/22/2015) Active. Loperamide HCl (2MG  Capsule, Oral) Active. Ondansetron HCl (4MG  Tablet, Oral) Active. LORazepam (0.5MG  Tablet, Oral) Active. Azor (5-20MG  Tablet, Oral) Active. ProAir HFA (108 (90 Base)MCG/ACT Aerosol Soln, Inhalation) Active. Sertraline HCl (100MG  Tablet, Oral) Active. Medications Reconciled  Social History Leighton Ruff, MD; [id number] 10:28 AM) Alcohol use Remotely quit alcohol use. Caffeine use Coffee,  Tea. Tobacco use Current every day smoker. No drug use  Family History Elmo Putt  Marcello Moores, MD; 12/03/2015 10:28 AM) Thyroid problems Father. Migraine Headache Daughter, Mother. Diabetes Mellitus Father. Melanoma Father. Depression Daughter, Mother. Hypertension Mother. Arthritis Father, Mother. Colon Polyps Father, Mother.  Pregnancy / Birth History Leighton Ruff, MD; [id number] 10:28 AM) Regular periods 65 1 Age of menopause <46 Maternal age 54-25 Age at menarche 53 years. Contraceptive History Oral contraceptives. Gravida 1     Review of Systems Leighton Ruff MD; [id number] 10:29 AM) General Present- Weight Loss. Not Present- Appetite Loss, Chills, Fatigue, Fever, Night Sweats and Weight Gain. Skin Not Present- Change in Wart/Mole, Dryness, Hives, Jaundice, New Lesions, Non-Healing Wounds, Rash and Ulcer. HEENT Present- Seasonal Allergies and Wears glasses/contact lenses. Not Present- Earache, Hearing Loss, Hoarseness, Nose Bleed, Oral Ulcers, Ringing in the Ears, Sinus Pain, Sore Throat, Visual Disturbances and Yellow Eyes. Respiratory Present- Wheezing. Not Present- Bloody sputum, Chronic Cough, Difficulty Breathing and Snoring. Breast Not Present- Breast Mass, Breast Pain, Nipple Discharge and Skin Changes. Cardiovascular Present- Swelling of Extremities. Not Present- Chest Pain, Difficulty Breathing Lying Down, Leg Cramps, Palpitations, Rapid Heart Rate and Shortness of Breath. Gastrointestinal Present- Bloating, Nausea and Vomiting. Not Present- Abdominal Pain, Bloody Stool, Change in Bowel Habits, Chronic diarrhea, Constipation, Difficulty Swallowing, Excessive gas, Gets full quickly at meals, Hemorrhoids, Indigestion and Rectal Pain. Female Genitourinary Present- Urgency. Not Present- Frequency, Nocturia, Painful Urination and Pelvic Pain. Psychiatric Present- Anxiety and Change in Sleep Pattern. Not Present- Bipolar, Depression, Fearful and Frequent  crying. Hematology Present- Easy Bruising. Not Present- Excessive bleeding, Gland problems, HIV and Persistent Infections.  Vitals Elbert Ewings CMA; 12/03/2015 10:17 AM) 12/03/2015 10:16 AM Weight: 163 lb Height: 62in Body Surface Area: 1.75 m Body Mass Index: 29.81 kg/m  Temp.: 97.7F(Temporal)  Pulse: 89 (Regular)  BP: 128/80 (Sitting, Left Arm, Standard)      Physical Exam Leighton Ruff MD; [id number] 10:29 AM)  General Mental Status-Alert. General Appearance-Consistent with stated age. Hydration-Well hydrated. Voice-Normal.  Chest and Lung Exam Chest and lung exam reveals -quiet, even and easy respiratory effort with no use of accessory muscles. Inspection Chest Wall - Normal. Back - normal.  Cardiovascular Cardiovascular examination reveals -normal heart sounds, regular rate and rhythm with no murmurs.  Abdomen Inspection  Inspection of the abdomen reveals: Note: ileostomy viable, healing well. Skin - Scar - Note: Incision: clean, dry, and intact. Palpation/Percussion Palpation and Percussion of the abdomen reveal - Soft, Non Tender, No Rebound tenderness and No Rigidity (guarding). Note: no distention.    Assessment & Plan Leighton Ruff MD; [id number] 10:26 AM)  PELVIC ABSCESS IN FEMALE (N73.9) Impression: Pt unable to keep down any liquids without vomiting. I have recommended direct admission and IVF rehydration. We will check her lab work and start IV antibiotics for her ongoing pelvic abscess. I believe we will need to do a diagnostic laparoscopy and washout to determine the cause of her ongoing abdominal issues.

## 2015-12-03 NOTE — Progress Notes (Signed)
Initial Nutrition Assessment  DOCUMENTATION CODES:   Severe malnutrition in context of acute illness/injury  INTERVENTION:  -RD to continue to monitor -[employer] instant breakfast if patient requests -Denied other ONS   NUTRITION DIAGNOSIS:   Inadequate oral intake related to poor appetite, nausea, vomiting as evidenced by per patient/family report.  GOAL:   Patient will meet greater than or equal to 90% of their needs  MONITOR:   PO intake, I & O's, Labs, Weight trends  REASON FOR ASSESSMENT:   Malnutrition Screening Tool    ASSESSMENT:   The patient is a 55 year old female who presents with colonic obstruction. Patient is referred by Dr. Scarlette Shorts due to a colonic stricture.  She has a history of total abdominal hysterectomy with pelvic lymphadenectomy for cervical cancer in 1994 She is s/p low anterior resection with diverting ileostomy mid February 2017. Presented with pelvic abscess that may need diagnostic laparoscopy and washout.  Spoke with Ms. Kell at bedside. She endorses  32# wt loss in 20 days, and she had eaten nothing since Friday 7/14 Per chart she exhibits a 50#/23% severe wt loss over 3 months. States that everything she ate "came right back up." Today she is feeling better. Had some nausea and vomiting this morning but is currently receiving zofran, experiencing no nausea. Denies chewing/swallowing problems.  For lunch she had Cheese toast, and beef broth she consumed 100% of. States "I was starving," appetite seems to be returning.  Labs and medications reviewed: CBGs 158, Na 128 Iron  Diet Order:  Diet regular Room service appropriate? Yes; Fluid consistency: Thin  Skin:     Last BM:  7/24  Height:   Ht Readings from Last 1 Encounters:  12/03/15 5\' 2"  (1.575 m)    Weight:   Wt Readings from Last 1 Encounters:  12/03/15 163 lb 12.8 oz (74.3 kg)    Ideal Body Weight:  50 kg  BMI:  Body mass index is 29.96 kg/m.  Estimated  Nutritional Needs:   Kcal:  1500-1800 calories  Protein:  75-90 grams  Fluid:  >/= 1.5L  EDUCATION NEEDS:   No education needs identified at this time  Cathy Jones. Taz Vanness, MS, RD LDN Inpatient Clinical Dietitian Pager [phone]

## 2015-12-03 NOTE — Progress Notes (Signed)
Patient arrived to unit  At around 1115, direct admit. Alert and oriented x 4. Oriented to room and environment. Placed comfortably in bed. Will continue to monitor.

## 2015-12-04 ENCOUNTER — Encounter (HOSPITAL_COMMUNITY): Payer: Self-pay | Admitting: Radiology

## 2015-12-04 ENCOUNTER — Inpatient Hospital Stay (HOSPITAL_COMMUNITY): Payer: BLUE CROSS/BLUE SHIELD

## 2015-12-04 LAB — CBC
HEMATOCRIT: 43.4 % (ref 36.0–46.0)
HEMOGLOBIN: 14.6 g/dL (ref 12.0–15.0)
MCH: 26.8 pg (ref 26.0–34.0)
MCHC: 33.6 g/dL (ref 30.0–36.0)
MCV: 79.8 fL (ref 78.0–100.0)
Platelets: 152 10*3/uL (ref 150–400)
RBC: 5.44 MIL/uL — ABNORMAL HIGH (ref 3.87–5.11)
RDW: 14.5 % (ref 11.5–15.5)
WBC: 6.4 10*3/uL (ref 4.0–10.5)

## 2015-12-04 LAB — BASIC METABOLIC PANEL
ANION GAP: 5 (ref 5–15)
BUN: 13 mg/dL (ref 6–20)
CHLORIDE: 110 mmol/L (ref 101–111)
CO2: 19 mmol/L — AB (ref 22–32)
Calcium: 8.8 mg/dL — ABNORMAL LOW (ref 8.9–10.3)
Creatinine, Ser: 0.82 mg/dL (ref 0.44–1.00)
GFR calc Af Amer: >60 mL/min (ref >60)
GFR calc non Af Amer: >60 mL/min (ref >60)
GLUCOSE: 103 mg/dL — AB (ref 65–99)
POTASSIUM: 4.1 mmol/L (ref 3.5–5.1)
Sodium: 134 mmol/L — ABNORMAL LOW (ref 135–145)

## 2015-12-04 MED ORDER — DIPHENOXYLATE-ATROPINE 2.5-0.025 MG PO TABS
1.0000 | ORAL_TABLET | Freq: Every day | ORAL | Status: DC
  Administered 2015-12-04: 1 via ORAL
  Filled 2015-12-04: qty 1

## 2015-12-04 MED ORDER — IOPAMIDOL (ISOVUE-300) INJECTION 61%
100.0000 mL | Freq: Once | INTRAVENOUS | Status: AC | PRN
  Administered 2015-12-04: 100 mL via INTRAVENOUS

## 2015-12-04 MED ORDER — FERROUS SULFATE 325 (65 FE) MG PO TABS
325.0000 mg | ORAL_TABLET | Freq: Every day | ORAL | Status: DC
  Administered 2015-12-04 – 2015-12-06 (×3): 325 mg via ORAL
  Filled 2015-12-04 (×3): qty 1

## 2015-12-04 MED ORDER — LORAZEPAM 0.5 MG PO TABS
0.5000 mg | ORAL_TABLET | Freq: Every morning | ORAL | Status: DC
  Administered 2015-12-04 – 2015-12-06 (×3): 0.5 mg via ORAL
  Filled 2015-12-04 (×3): qty 1

## 2015-12-04 MED ORDER — DIATRIZOATE MEGLUMINE & SODIUM 66-10 % PO SOLN
15.0000 mL | ORAL | Status: DC | PRN
  Filled 2015-12-04: qty 30

## 2015-12-04 MED ORDER — SERTRALINE HCL 100 MG PO TABS
100.0000 mg | ORAL_TABLET | Freq: Every morning | ORAL | Status: DC
  Administered 2015-12-04 – 2015-12-06 (×3): 100 mg via ORAL
  Filled 2015-12-04 (×3): qty 1

## 2015-12-04 MED ORDER — ACETAMINOPHEN 500 MG PO TABS
1000.0000 mg | ORAL_TABLET | Freq: Four times a day (QID) | ORAL | Status: DC | PRN
  Administered 2015-12-04: 1000 mg via ORAL
  Filled 2015-12-04: qty 2

## 2015-12-04 NOTE — Progress Notes (Signed)
Dehydration  Subjective: Pt feeling a bit better.    Objective: Vital signs in last 24 hours: Temp:  [97.7 F (36.5 C)-98.8 F (37.1 C)] 98.4 F (36.9 C) (07/25 0610) Pulse Rate:  [75-93] 75 (07/25 0610) Resp:  [18] 18 (07/25 0610) BP: (115-139)/(56-80) 139/56 (07/25 0610) SpO2:  [95 %-100 %] 95 % (07/25 0610) Weight:  [74.3 kg (163 lb 12.8 oz)] 74.3 kg (163 lb 12.8 oz) (07/24 1126) Last BM Date: 12/04/15  Intake/Output from previous day: 07/24 0701 - 07/25 0700 In: 785 [P.O.:360; I.V.:375; IV Piggyback:50] Out: 750 [Urine:350; Stool:400] Intake/Output this shift: No intake/output data recorded.  General appearance: alert and cooperative GI: soft, nontender  Lab Results:  Results for orders placed or performed during the hospital encounter of 12/03/15 (from the past 24 hour(s))  CBC     Status: Abnormal   Collection Time: 12/03/15 12:00 PM  Result Value Ref Range   WBC 10.6 (H) 4.0 - 10.5 K/uL   RBC 6.01 (H) 3.87 - 5.11 MIL/uL   Hemoglobin 16.5 (H) 12.0 - 15.0 g/dL   HCT 48.7 (H) 36.0 - 46.0 %   MCV 81.0 78.0 - 100.0 fL   MCH 27.5 26.0 - 34.0 pg   MCHC 33.9 30.0 - 36.0 g/dL   RDW 14.4 11.5 - 15.5 %   Platelets 195 150 - 400 K/uL  Comprehensive metabolic panel     Status: Abnormal   Collection Time: 12/03/15 12:00 PM  Result Value Ref Range   Sodium 128 (L) 135 - 145 mmol/L   Potassium 3.9 3.5 - 5.1 mmol/L   Chloride 100 (L) 101 - 111 mmol/L   CO2 18 (L) 22 - 32 mmol/L   Glucose, Bld 122 (H) 65 - 99 mg/dL   BUN 17 6 - 20 mg/dL   Creatinine, Ser 1.22 (H) 0.44 - 1.00 mg/dL   Calcium 9.6 8.9 - 10.3 mg/dL   Total Protein 7.6 6.5 - 8.1 g/dL   Albumin 4.7 3.5 - 5.0 g/dL   AST 15 15 - 41 U/L   ALT 11 (L) 14 - 54 U/L   Alkaline Phosphatase 115 38 - 126 U/L   Total Bilirubin 0.7 0.3 - 1.2 mg/dL   GFR calc non Af Amer 50 (L) >60 mL/min   GFR calc Af Amer 58 (L) >60 mL/min   Anion gap 10 5 - 15  Magnesium     Status: None   Collection Time: 12/03/15 12:00 PM   Result Value Ref Range   Magnesium 1.8 1.7 - 2.4 mg/dL  Phosphorus     Status: None   Collection Time: 12/03/15 12:00 PM  Result Value Ref Range   Phosphorus 3.4 2.5 - 4.6 mg/dL     Studies/Results Radiology     MEDS, Scheduled . diphenoxylate-atropine  1 tablet Oral Daily  . ertapenem  1 g Intravenous Q24H  . ferrous sulfate  325 mg Oral Daily  . LORazepam  0.5 mg Oral q morning - 10a  . sertraline  100 mg Oral q morning - 10a     Assessment: Dehydration   Plan: Will get CT to evaluate for any sign of obstruction.   Cont antibiotics for pelvic abscess Cont IVF's for dehydration Albumin normal, weight stable- fortunately, no signs of malnutrition currently   LOS: 1 day    Rosario Adie, MD [facility], [place] [phone]   12/04/2015 7:39 AM

## 2015-12-05 ENCOUNTER — Encounter (HOSPITAL_COMMUNITY): Payer: Self-pay | Admitting: Radiology

## 2015-12-05 ENCOUNTER — Inpatient Hospital Stay (HOSPITAL_COMMUNITY): Payer: BLUE CROSS/BLUE SHIELD

## 2015-12-05 MED ORDER — LOPERAMIDE HCL 2 MG PO CAPS
2.0000 mg | ORAL_CAPSULE | Freq: Two times a day (BID) | ORAL | Status: DC
  Administered 2015-12-05 (×2): 2 mg via ORAL
  Filled 2015-12-05 (×2): qty 1

## 2015-12-05 MED ORDER — DIPHENOXYLATE-ATROPINE 2.5-0.025 MG PO TABS
1.0000 | ORAL_TABLET | Freq: Two times a day (BID) | ORAL | Status: DC
  Administered 2015-12-05 (×2): 1 via ORAL
  Filled 2015-12-05 (×2): qty 1

## 2015-12-05 NOTE — Progress Notes (Signed)
Dehydration    Subjective: Pt having nausea and vomiting in AM but otherwise tolerating a diet.  CT scan shows enlarging right sided pelvic abscess.  Objective: Vital signs in last 24 hours: Temp:  [98.1 F (36.7 C)-98.7 F (37.1 C)] 98.1 F (36.7 C) (07/26 0627) Pulse Rate:  [73-82] 73 (07/26 0627) Resp:  [16-18] 18 (07/26 0627) BP: (118-129)/(66-90) 118/66 (07/26 0627) SpO2:  [100 %] 100 % (07/26 0627) Last BM Date: 12/04/15  Intake/Output from previous day: 07/25 0701 - 07/26 0700 In: 1075 [I.V.:1025; IV Piggyback:50] Out: 2600 [Urine:900; Stool:1700] Intake/Output this shift: No intake/output data recorded.  General appearance: alert and cooperative GI: soft, nontender  Lab Results:  No results found for this or any previous visit (from the past 24 hour(s)).   Studies/Results Radiology     MEDS, Scheduled . diphenoxylate-atropine  1 tablet Oral BID  . ertapenem  1 g Intravenous Q24H  . ferrous sulfate  325 mg Oral Daily  . loperamide  2 mg Oral BID  . LORazepam  0.5 mg Oral q morning - 10a  . sertraline  100 mg Oral q morning - 10a     Assessment: Dehydration Pelvic abscess: enlarging, cont Invanz. Nausea and vomiting: no signs of surgical cause or obstruction, labs normalizing   Plan: Restarted home anti-motility agents   Will check pelvic CT with rectal contrast to evaluate for persistent leak, since PO contrast did not reach anastomosis   LOS: 2 days    Rosario Adie, MD [facility], [place] [phone]   12/05/2015 8:39 AM

## 2015-12-06 MED ORDER — LOPERAMIDE HCL 2 MG PO CAPS: 4.0000 mg | ORAL_CAPSULE | Freq: Two times a day (BID) | ORAL | 0 refills | Status: DC

## 2015-12-06 MED ORDER — DIPHENOXYLATE-ATROPINE 2.5-0.025 MG PO TABS
2.0000 | ORAL_TABLET | Freq: Two times a day (BID) | ORAL | Status: DC
  Administered 2015-12-06: 2 via ORAL
  Filled 2015-12-06: qty 2

## 2015-12-06 MED ORDER — LOPERAMIDE HCL 2 MG PO CAPS
4.0000 mg | ORAL_CAPSULE | Freq: Two times a day (BID) | ORAL | Status: DC
  Administered 2015-12-06: 4 mg via ORAL
  Filled 2015-12-06: qty 2

## 2015-12-06 MED ORDER — SODIUM CHLORIDE 0.9 % IV SOLN: 1000.0000 mL | INTRAVENOUS | 0 refills | Status: DC | PRN

## 2015-12-06 MED ORDER — SODIUM CHLORIDE 0.9 % IV SOLN: 1.0000 g | INTRAVENOUS | Status: DC

## 2015-12-06 MED ORDER — DIPHENOXYLATE-ATROPINE 2.5-0.025 MG PO TABS: 2.0000 | ORAL_TABLET | Freq: Two times a day (BID) | ORAL | Status: DC

## 2015-12-06 NOTE — Discharge Instructions (Signed)

## 2015-12-06 NOTE — Progress Notes (Signed)
CM spoke with [facility] rep,  Carolynn Sayers who states she has both prescription and orders and no other needs to begin [facility] tomorrow for daily IV ABX Colbert Ewing).  CM spoke with pt who verbalized understanding [facility] will be at her home tomorrow for [facility].  NO other CM needs were communicated.

## 2015-12-06 NOTE — Discharge Summary (Signed)
  Patient ID: Cathy Jones [id number] 54 y.o. 1960-11-08  12/03/2015  Discharge date and time: No discharge date for patient encounter.  Admitting Physician: Rosario Adie  Discharge Physician: Rosario Adie.  Admission Diagnoses: Dehydration  Discharge Diagnoses: persistent pelvic abscess, GERD  Operations: none    Discharged Condition: good    Hospital Course: Pt admitted due to persistent nausea and vomiting and concern for dehydration.  She was given IV fluids and placed on antibiotics for a known pelvic abscess.  A repeat CT was performed.  This showed that the pelvic abscess was enlarged.  A rectal contrast CT shows no persistent leak.  She has been tolerating a diet in the hospital with minimal nausea and vomiting.  I recommended that she take a PPI at night to help with this, as it does not seem to be related to her surgical problems.    Consults: None  Significant Diagnostic Studies: labs: cbc, chemistry  Treatments: IV hydration and antibiotics: invanz  Disposition: Home

## 2015-12-10 ENCOUNTER — Other Ambulatory Visit (HOSPITAL_COMMUNITY)
Admission: RE | Admit: 2015-12-10 | Discharge: 2015-12-10 | Disposition: A | Discharge status: Home / Self Care | Payer: BLUE CROSS/BLUE SHIELD | Source: Other Acute Inpatient Hospital | Attending: General Surgery | Admitting: General Surgery

## 2015-12-10 DIAGNOSIS — K611 Rectal abscess: Secondary | ICD-10-CM | POA: Diagnosis present

## 2015-12-10 DIAGNOSIS — E860 Dehydration: Secondary | ICD-10-CM | POA: Insufficient documentation

## 2015-12-10 LAB — CBC WITH DIFFERENTIAL/PLATELET
Basophils Absolute: 0 10*3/uL (ref 0.0–0.1)
Basophils Relative: 0 %
EOS PCT: 1 %
Eosinophils Absolute: 0.2 10*3/uL (ref 0.0–0.7)
HEMATOCRIT: 43.9 % (ref 36.0–46.0)
Hemoglobin: 14.3 g/dL (ref 12.0–15.0)
LYMPHS PCT: 15 %
Lymphs Abs: 1.8 10*3/uL (ref 0.7–4.0)
MCH: 26.8 pg (ref 26.0–34.0)
MCHC: 32.6 g/dL (ref 30.0–36.0)
MCV: 82.2 fL (ref 78.0–100.0)
MONO ABS: 0.4 10*3/uL (ref 0.1–1.0)
MONOS PCT: 4 %
NEUTROS ABS: 9.9 10*3/uL — AB (ref 1.7–7.7)
Neutrophils Relative %: 80 %
PLATELETS: 183 10*3/uL (ref 150–400)
RBC: 5.34 MIL/uL — ABNORMAL HIGH (ref 3.87–5.11)
RDW: 15.1 % (ref 11.5–15.5)
WBC: 12.3 10*3/uL — ABNORMAL HIGH (ref 4.0–10.5)

## 2015-12-10 LAB — BASIC METABOLIC PANEL
ANION GAP: 3 — AB (ref 5–15)
BUN: 9 mg/dL (ref 6–20)
CALCIUM: 8.9 mg/dL (ref 8.9–10.3)
CO2: 22 mmol/L (ref 22–32)
Chloride: 112 mmol/L — ABNORMAL HIGH (ref 101–111)
Creatinine, Ser: 0.65 mg/dL (ref 0.44–1.00)
GFR calc Af Amer: >60 mL/min (ref >60)
GFR calc non Af Amer: >60 mL/min (ref >60)
GLUCOSE: 76 mg/dL (ref 65–99)
POTASSIUM: 4.6 mmol/L (ref 3.5–5.1)
Sodium: 137 mmol/L (ref 135–145)

## 2015-12-10 LAB — SEDIMENTATION RATE: Sed Rate: 2 mm/hr (ref 0–22)

## 2015-12-10 IMAGING — CT CT ABD-PELV W/ CM
2 of 5 series · 17 of 46 positions shown, 19 images · IV contrast (Omnipaque 300)
Comparison: None.

CLINICAL DATA: Lower abdominal pain, constipation. Sigmoid colon
stricture on colonoscopy. History of cervical cancer status post
hysterectomy.

EXAM:
CT ABDOMEN AND PELVIS WITH CONTRAST
TECHNIQUE: Multidetector CT imaging of the abdomen and pelvis was performed
using the standard protocol following bolus administration of
intravenous contrast.
CONTRAST:  100mL OMNIPAQUE IOHEXOL 300 MG/ML  SOLN

[Series 2: abd/ pel 5mm · axial · 0.76mm/px · z∈[-490,-66]mm · 14 of 95 slices shown, 16 images]
[im 5/95  soft-tissue]
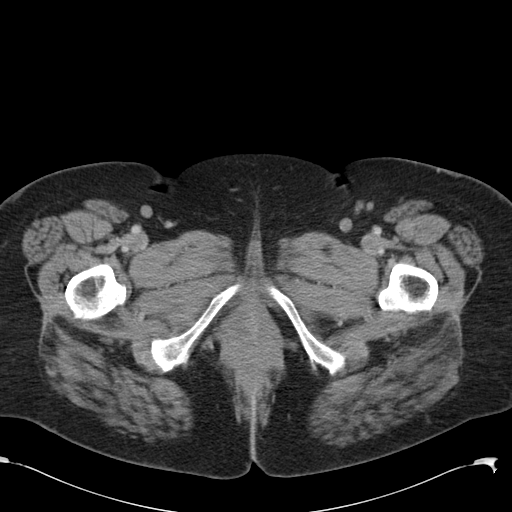
[im 5/95  bone]
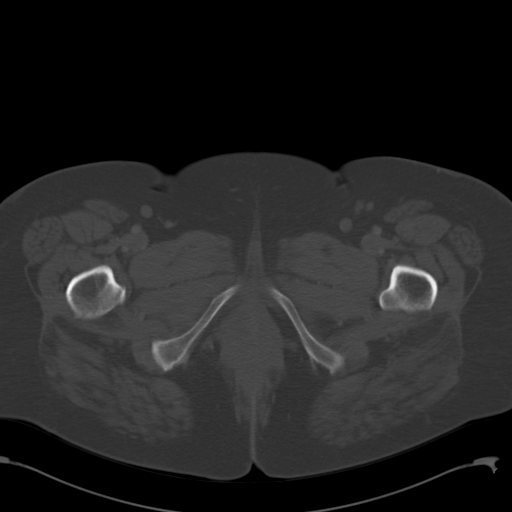
[im 10/95  soft-tissue]
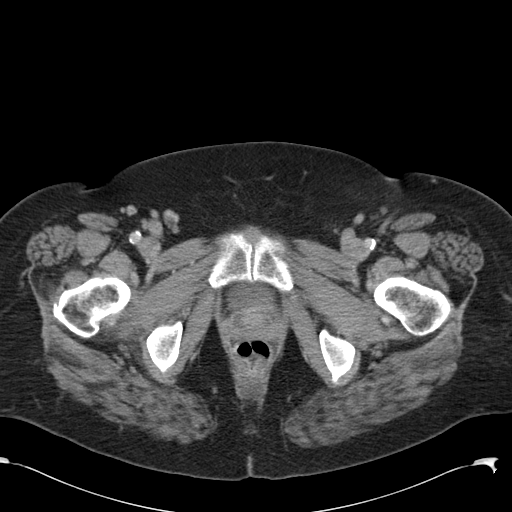
[im 20/95  soft-tissue]
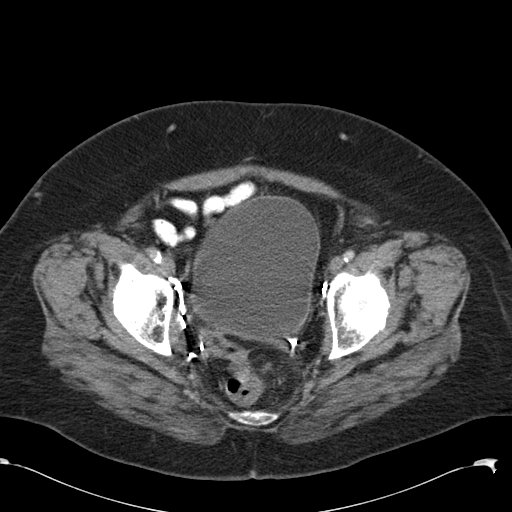
[im 25/95  soft-tissue]
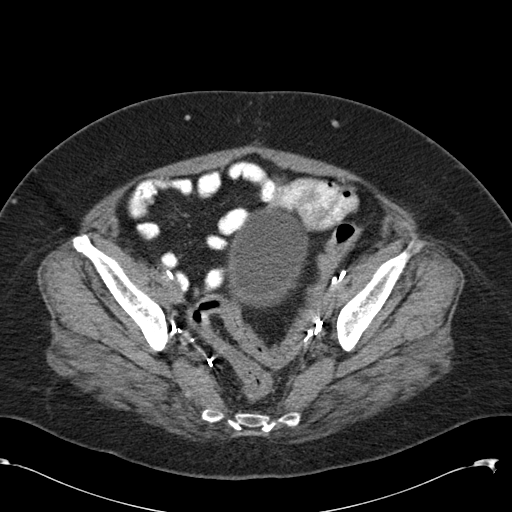
[im 30/95  soft-tissue]
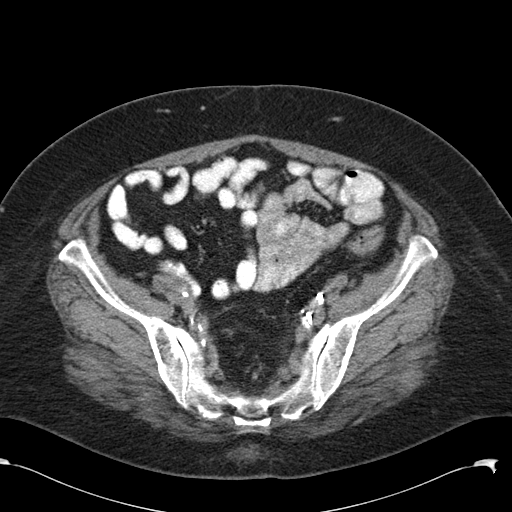
[im 40/95  soft-tissue]
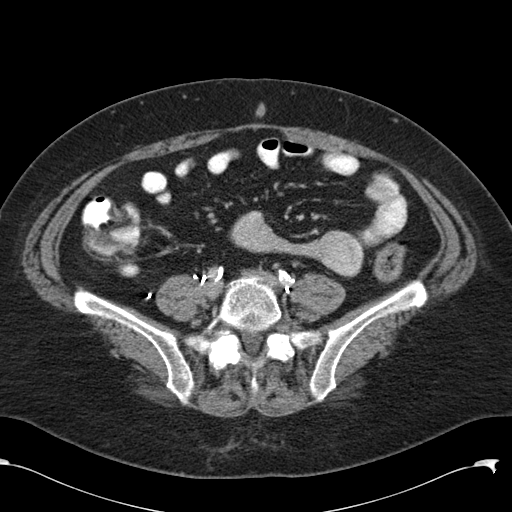
[im 45/95  soft-tissue]
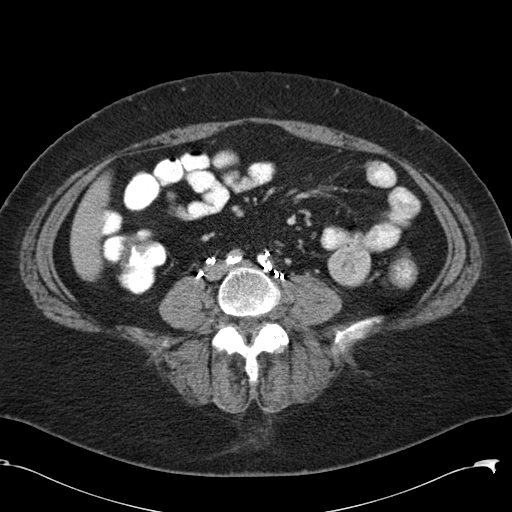
[im 50/95  soft-tissue]
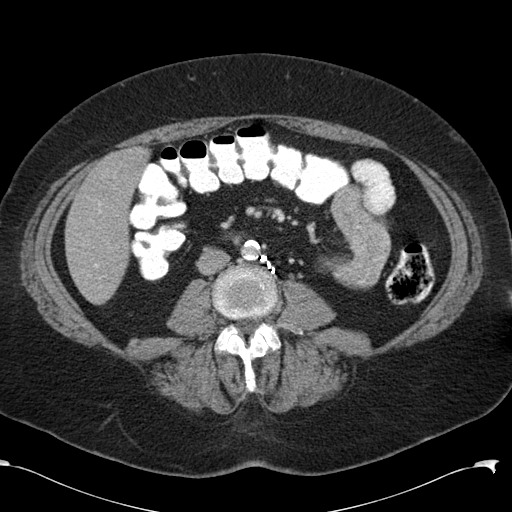
[im 55/95  soft-tissue]
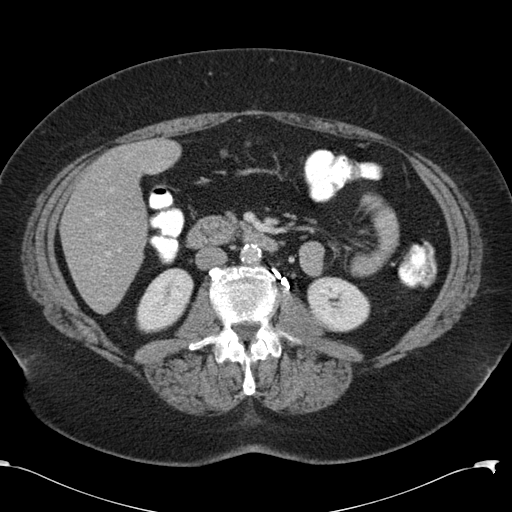
[im 55/95  bone]
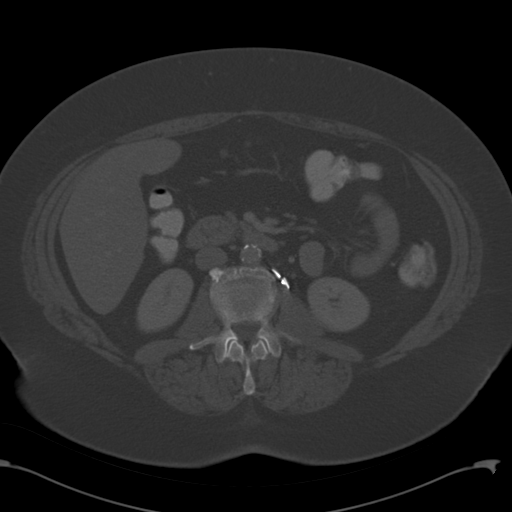
[im 65/95  soft-tissue]
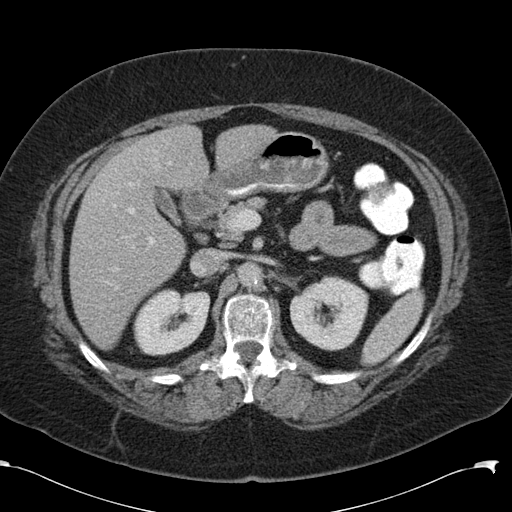
[im 70/95  soft-tissue]
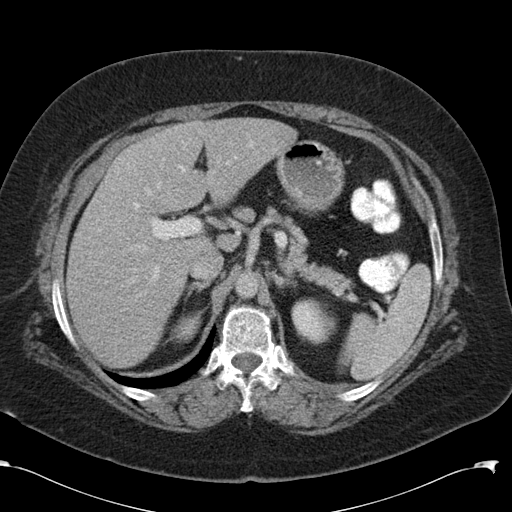
[im 75/95  soft-tissue]
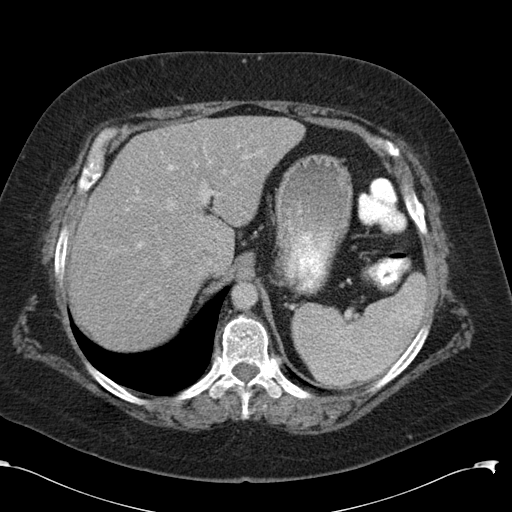
[im 85/95  soft-tissue]
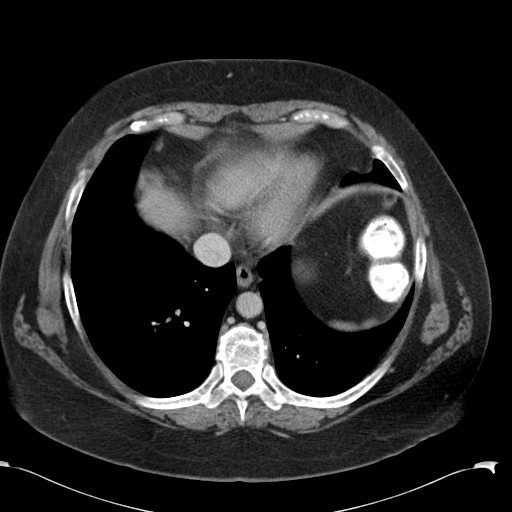
[im 90/95  soft-tissue]
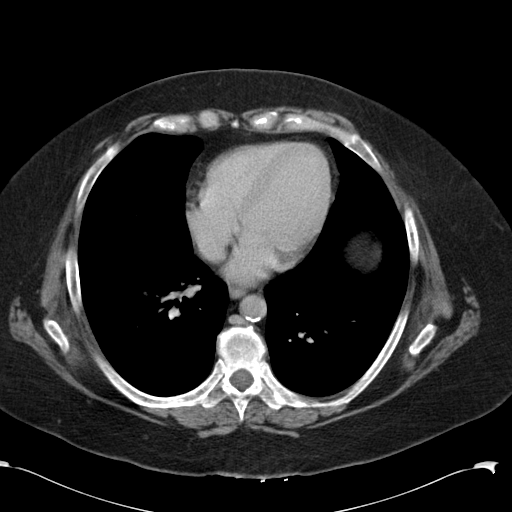

[Series 602: cor · coronal · 0.95mm/px · 3 of 144 slices shown]
[im 48/144  soft-tissue]
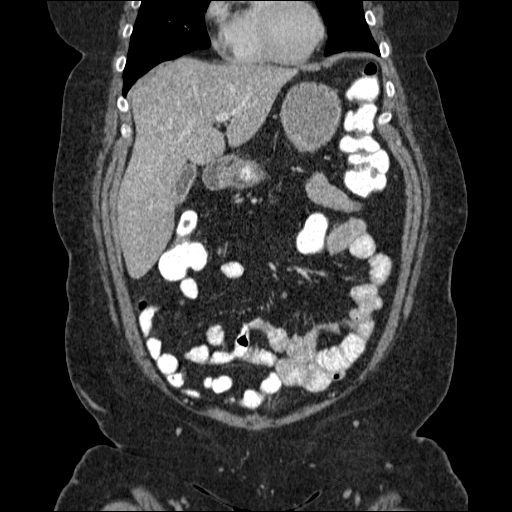
[im 64/144  soft-tissue]
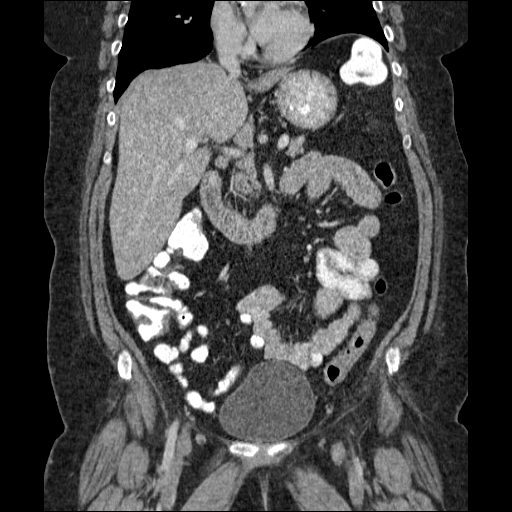
[im 80/144  soft-tissue]
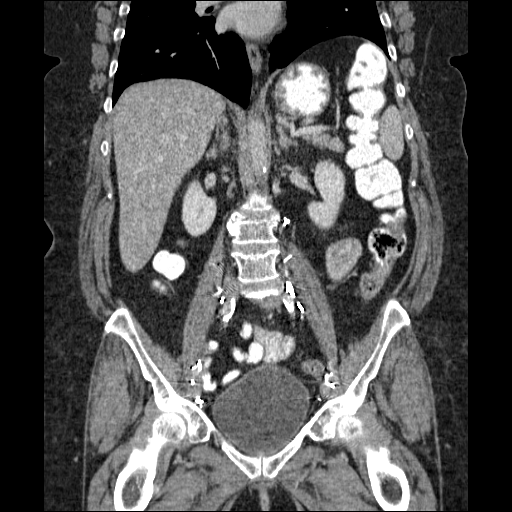

[17 of 46 positions shown; findings below may reference images not displayed]

FINDINGS: Lower chest:  Lung bases are clear.

Hepatobiliary: Liver is within normal limits.

Gallbladder is underdistended. No intrahepatic or extrahepatic
ductal dilatation.

Pancreas: Within normal limits.

Spleen: Within normal limits.

Adrenals/Urinary Tract: Adrenal glands are unremarkable.

Kidneys are within normal limits.  No hydronephrosis.

Bladder is within normal limits.

Stomach/Bowel: Stomach is unremarkable.

No evidence of bowel obstruction.

Normal appendix.

Sigmoid colon is narrowed but underdistended with associated diffuse
wall thickening (series 2/image 71).

Vascular/Lymphatic: Atherosclerotic calcifications of the abdominal
aorta and branch vessels.

Mildly enlarged lymph nodes in the porta hepatis measuring 11-13 mm
short axis (series 2/ image 26).

Retroperitoneal/pelvic lymphadenectomy.

Reproductive: Status post hysterectomy.

No adnexal masses.

Other: No abdominopelvic ascites.

Musculoskeletal: Degenerative changes of the visualized
thoracolumbar spine.
IMPRESSION: Status post hysterectomy with suspected bilateral
salpingo-oophorectomy. Retroperitoneal/pelvic lymphadenectomy.

Mildly prominent lymph nodes in the porta hepatis measuring 11-13 mm
short axis, indeterminate but likely reactive.

Sigmoid colon is diffusely narrowed/underdistended with wall
thickening. Correlate for prior history of pelvic radiation.
Alternatively, this could reflect infectious/inflammatory colitis.

## 2015-12-11 ENCOUNTER — Other Ambulatory Visit: Payer: Self-pay | Admitting: General Surgery

## 2015-12-11 ENCOUNTER — Encounter (HOSPITAL_COMMUNITY): Payer: Self-pay | Admitting: *Deleted

## 2015-12-12 ENCOUNTER — Inpatient Hospital Stay (HOSPITAL_COMMUNITY): Payer: BLUE CROSS/BLUE SHIELD | Admitting: Certified Registered Nurse Anesthetist

## 2015-12-12 ENCOUNTER — Inpatient Hospital Stay (HOSPITAL_COMMUNITY): Payer: BLUE CROSS/BLUE SHIELD

## 2015-12-12 ENCOUNTER — Encounter (HOSPITAL_COMMUNITY): Payer: Self-pay | Admitting: *Deleted

## 2015-12-12 ENCOUNTER — Encounter (HOSPITAL_COMMUNITY)
Admission: RE | Disposition: A | Discharge status: Home / Self Care | Payer: Self-pay | Source: Ambulatory Visit | Attending: General Surgery

## 2015-12-12 ENCOUNTER — Inpatient Hospital Stay (HOSPITAL_COMMUNITY)
Admission: RE | Admit: 2015-12-12 | Discharge: 2015-12-19 | DRG: 331 | Disposition: A | Discharge status: Home / Self Care | Payer: BLUE CROSS/BLUE SHIELD | Source: Ambulatory Visit | Attending: General Surgery | Admitting: General Surgery

## 2015-12-12 DIAGNOSIS — F17200 Nicotine dependence, unspecified, uncomplicated: Secondary | ICD-10-CM | POA: Diagnosis present

## 2015-12-12 DIAGNOSIS — Z8541 Personal history of malignant neoplasm of cervix uteri: Secondary | ICD-10-CM | POA: Diagnosis not present

## 2015-12-12 DIAGNOSIS — K660 Peritoneal adhesions (postprocedural) (postinfection): Secondary | ICD-10-CM | POA: Diagnosis present

## 2015-12-12 DIAGNOSIS — Z90710 Acquired absence of both cervix and uterus: Secondary | ICD-10-CM | POA: Diagnosis not present

## 2015-12-12 DIAGNOSIS — Z452 Encounter for adjustment and management of vascular access device: Secondary | ICD-10-CM

## 2015-12-12 DIAGNOSIS — Z432 Encounter for attention to ileostomy: Principal | ICD-10-CM

## 2015-12-12 DIAGNOSIS — I10 Essential (primary) hypertension: Secondary | ICD-10-CM | POA: Diagnosis present

## 2015-12-12 DIAGNOSIS — Z79899 Other long term (current) drug therapy: Secondary | ICD-10-CM

## 2015-12-12 DIAGNOSIS — K219 Gastro-esophageal reflux disease without esophagitis: Secondary | ICD-10-CM | POA: Diagnosis present

## 2015-12-12 DIAGNOSIS — Z808 Family history of malignant neoplasm of other organs or systems: Secondary | ICD-10-CM | POA: Diagnosis not present

## 2015-12-12 DIAGNOSIS — K449 Diaphragmatic hernia without obstruction or gangrene: Secondary | ICD-10-CM | POA: Diagnosis present

## 2015-12-12 DIAGNOSIS — N739 Female pelvic inflammatory disease, unspecified: Secondary | ICD-10-CM | POA: Diagnosis present

## 2015-12-12 DIAGNOSIS — I739 Peripheral vascular disease, unspecified: Secondary | ICD-10-CM | POA: Diagnosis present

## 2015-12-12 DIAGNOSIS — Z833 Family history of diabetes mellitus: Secondary | ICD-10-CM | POA: Diagnosis not present

## 2015-12-12 DIAGNOSIS — K529 Noninfective gastroenteritis and colitis, unspecified: Secondary | ICD-10-CM | POA: Diagnosis present

## 2015-12-12 DIAGNOSIS — Z932 Ileostomy status: Secondary | ICD-10-CM

## 2015-12-12 HISTORY — PX: FLEXIBLE SIGMOIDOSCOPY: SHX5431

## 2015-12-12 HISTORY — DX: Personal history of irradiation: Z92.3

## 2015-12-12 HISTORY — PX: ILEOSTOMY CLOSURE: SHX1784

## 2015-12-12 HISTORY — DX: Personal history of antineoplastic chemotherapy: Z92.21

## 2015-12-12 SURGERY — CLOSURE, ILEOSTOMY
Anesthesia: General | Site: Abdomen

## 2015-12-12 MED ORDER — CHLORHEXIDINE GLUCONATE CLOTH 2 % EX PADS: 6.0000 | MEDICATED_PAD | Freq: Once | CUTANEOUS | Status: DC

## 2015-12-12 MED ORDER — SODIUM CHLORIDE 0.9 % IJ SOLN
INTRAMUSCULAR | Status: AC
  Filled 2015-12-12: qty 10

## 2015-12-12 MED ORDER — LABETALOL HCL 5 MG/ML IV SOLN
INTRAVENOUS | Status: AC
  Filled 2015-12-12: qty 4

## 2015-12-12 MED ORDER — FENTANYL CITRATE (PF) 100 MCG/2ML IJ SOLN
INTRAMUSCULAR | Status: DC | PRN
  Administered 2015-12-12 (×5): 50 ug via INTRAVENOUS
  Administered 2015-12-12: 100 ug via INTRAVENOUS
  Administered 2015-12-12 (×3): 50 ug via INTRAVENOUS

## 2015-12-12 MED ORDER — IBUPROFEN 200 MG PO TABS
600.0000 mg | ORAL_TABLET | Freq: Four times a day (QID) | ORAL | Status: DC | PRN
  Administered 2015-12-16 (×2): 600 mg via ORAL
  Filled 2015-12-12 (×3): qty 3

## 2015-12-12 MED ORDER — PROPOFOL 10 MG/ML IV BOLUS
INTRAVENOUS | Status: DC | PRN
  Administered 2015-12-12: 150 mg via INTRAVENOUS

## 2015-12-12 MED ORDER — SODIUM CHLORIDE 0.9% FLUSH
10.0000 mL | INTRAVENOUS | Status: DC | PRN
  Administered 2015-12-14 – 2015-12-18 (×2): 10 mL
  Filled 2015-12-12 (×2): qty 40

## 2015-12-12 MED ORDER — ENOXAPARIN SODIUM 40 MG/0.4ML ~~LOC~~ SOLN
40.0000 mg | SUBCUTANEOUS | Status: DC
  Administered 2015-12-13 – 2015-12-19 (×7): 40 mg via SUBCUTANEOUS
  Filled 2015-12-12 (×8): qty 0.4

## 2015-12-12 MED ORDER — SODIUM CHLORIDE 0.9 % IJ SOLN
INTRAMUSCULAR | Status: AC
  Filled 2015-12-12: qty 20

## 2015-12-12 MED ORDER — FENTANYL CITRATE (PF) 250 MCG/5ML IJ SOLN
INTRAMUSCULAR | Status: AC
  Filled 2015-12-12: qty 5

## 2015-12-12 MED ORDER — CEFOTETAN DISODIUM-DEXTROSE 2-2.08 GM-% IV SOLR
INTRAVENOUS | Status: AC
  Filled 2015-12-12: qty 50

## 2015-12-12 MED ORDER — MIDAZOLAM HCL 2 MG/2ML IJ SOLN
INTRAMUSCULAR | Status: AC
  Filled 2015-12-12: qty 2

## 2015-12-12 MED ORDER — HYDRALAZINE HCL 20 MG/ML IJ SOLN
5.0000 mg | Freq: Once | INTRAMUSCULAR | Status: AC
  Administered 2015-12-12: 5 mg via INTRAVENOUS

## 2015-12-12 MED ORDER — VITAMIN B-12 100 MCG PO TABS
100.0000 ug | ORAL_TABLET | Freq: Every day | ORAL | Status: DC
  Administered 2015-12-13 – 2015-12-19 (×7): 100 ug via ORAL
  Filled 2015-12-12 (×7): qty 1

## 2015-12-12 MED ORDER — HYDRALAZINE HCL 20 MG/ML IJ SOLN
INTRAMUSCULAR | Status: DC | PRN
  Administered 2015-12-12: 2 mg via INTRAVENOUS

## 2015-12-12 MED ORDER — HYDROMORPHONE HCL 1 MG/ML IJ SOLN
INTRAMUSCULAR | Status: DC | PRN
  Administered 2015-12-12 (×2): .4 mg via INTRAVENOUS
  Administered 2015-12-12 (×2): .2 mg via INTRAVENOUS

## 2015-12-12 MED ORDER — HYDRALAZINE HCL 20 MG/ML IJ SOLN
INTRAMUSCULAR | Status: AC
  Filled 2015-12-12: qty 1

## 2015-12-12 MED ORDER — MORPHINE SULFATE (PF) 2 MG/ML IV SOLN
2.0000 mg | INTRAVENOUS | Status: DC | PRN
  Administered 2015-12-12 (×2): 2 mg via INTRAVENOUS
  Administered 2015-12-13 (×2): 4 mg via INTRAVENOUS
  Filled 2015-12-12: qty 1
  Filled 2015-12-12: qty 2
  Filled 2015-12-12: qty 1
  Filled 2015-12-12: qty 2
  Filled 2015-12-12: qty 1

## 2015-12-12 MED ORDER — CHLORHEXIDINE GLUCONATE 0.12 % MT SOLN
15.0000 mL | Freq: Two times a day (BID) | OROMUCOSAL | Status: DC
  Administered 2015-12-12 – 2015-12-16 (×7): 15 mL via OROMUCOSAL
  Filled 2015-12-12 (×13): qty 15

## 2015-12-12 MED ORDER — KCL IN DEXTROSE-NACL 20-5-0.45 MEQ/L-%-% IV SOLN
INTRAVENOUS | Status: DC
  Administered 2015-12-12 – 2015-12-14 (×3): via INTRAVENOUS
  Administered 2015-12-15: 1000 mL via INTRAVENOUS
  Administered 2015-12-16 – 2015-12-17 (×4): via INTRAVENOUS
  Filled 2015-12-12 (×12): qty 1000

## 2015-12-12 MED ORDER — DIPHENOXYLATE-ATROPINE 2.5-0.025 MG PO TABS
2.0000 | ORAL_TABLET | Freq: Two times a day (BID) | ORAL | Status: DC
  Administered 2015-12-12: 2 via ORAL
  Filled 2015-12-12 (×3): qty 2

## 2015-12-12 MED ORDER — EPHEDRINE SULFATE 50 MG/ML IJ SOLN
INTRAMUSCULAR | Status: DC | PRN
  Administered 2015-12-12: 5 mg via INTRAVENOUS

## 2015-12-12 MED ORDER — DEXTROSE 5 % IV SOLN
2.0000 g | Freq: Two times a day (BID) | INTRAVENOUS | Status: AC
  Administered 2015-12-12: 2 g via INTRAVENOUS
  Filled 2015-12-12: qty 2

## 2015-12-12 MED ORDER — ALBUTEROL SULFATE HFA 108 (90 BASE) MCG/ACT IN AERS: 2.0000 | INHALATION_SPRAY | Freq: Four times a day (QID) | RESPIRATORY_TRACT | Status: DC | PRN

## 2015-12-12 MED ORDER — CETYLPYRIDINIUM CHLORIDE 0.05 % MT LIQD
7.0000 mL | Freq: Two times a day (BID) | OROMUCOSAL | Status: DC
  Administered 2015-12-12 – 2015-12-14 (×4): 7 mL via OROMUCOSAL

## 2015-12-12 MED ORDER — PROPOFOL 10 MG/ML IV BOLUS
INTRAVENOUS | Status: AC
  Filled 2015-12-12: qty 20

## 2015-12-12 MED ORDER — DEXAMETHASONE SODIUM PHOSPHATE 10 MG/ML IJ SOLN
INTRAMUSCULAR | Status: DC | PRN
  Administered 2015-12-12: 10 mg via INTRAVENOUS

## 2015-12-12 MED ORDER — HYDROMORPHONE HCL 2 MG/ML IJ SOLN
INTRAMUSCULAR | Status: AC
  Filled 2015-12-12: qty 1

## 2015-12-12 MED ORDER — ROCURONIUM BROMIDE 100 MG/10ML IV SOLN
INTRAVENOUS | Status: AC
  Filled 2015-12-12: qty 1

## 2015-12-12 MED ORDER — SUGAMMADEX SODIUM 500 MG/5ML IV SOLN
INTRAVENOUS | Status: DC | PRN
  Administered 2015-12-12: 200 mg via INTRAVENOUS

## 2015-12-12 MED ORDER — ONDANSETRON HCL 4 MG/2ML IJ SOLN
INTRAMUSCULAR | Status: DC | PRN
  Administered 2015-12-12: 4 mg via INTRAVENOUS

## 2015-12-12 MED ORDER — EPHEDRINE SULFATE 50 MG/ML IJ SOLN
INTRAMUSCULAR | Status: AC
  Filled 2015-12-12: qty 1

## 2015-12-12 MED ORDER — HYDROMORPHONE HCL 1 MG/ML IJ SOLN
0.2500 mg | INTRAMUSCULAR | Status: DC | PRN
  Administered 2015-12-12: 0.5 mg via INTRAVENOUS

## 2015-12-12 MED ORDER — ACETAMINOPHEN 500 MG PO TABS
1000.0000 mg | ORAL_TABLET | Freq: Four times a day (QID) | ORAL | Status: DC
  Filled 2015-12-12 (×3): qty 2

## 2015-12-12 MED ORDER — MIDAZOLAM HCL 5 MG/5ML IJ SOLN
INTRAMUSCULAR | Status: DC | PRN
  Administered 2015-12-12: 2 mg via INTRAVENOUS

## 2015-12-12 MED ORDER — SUGAMMADEX SODIUM 500 MG/5ML IV SOLN
INTRAVENOUS | Status: AC
  Filled 2015-12-12: qty 5

## 2015-12-12 MED ORDER — ONDANSETRON HCL 4 MG/2ML IJ SOLN
INTRAMUSCULAR | Status: AC
  Filled 2015-12-12: qty 2

## 2015-12-12 MED ORDER — LIDOCAINE HCL (CARDIAC) 20 MG/ML IV SOLN
INTRAVENOUS | Status: AC
  Filled 2015-12-12: qty 5

## 2015-12-12 MED ORDER — FAMOTIDINE 20 MG PO TABS
20.0000 mg | ORAL_TABLET | Freq: Two times a day (BID) | ORAL | Status: DC
  Administered 2015-12-12 – 2015-12-19 (×14): 20 mg via ORAL
  Filled 2015-12-12 (×15): qty 1

## 2015-12-12 MED ORDER — DEXAMETHASONE SODIUM PHOSPHATE 10 MG/ML IJ SOLN
INTRAMUSCULAR | Status: AC
  Filled 2015-12-12: qty 1

## 2015-12-12 MED ORDER — SUCCINYLCHOLINE CHLORIDE 20 MG/ML IJ SOLN
INTRAMUSCULAR | Status: DC | PRN
  Administered 2015-12-12: 100 mg via INTRAVENOUS

## 2015-12-12 MED ORDER — HYDROMORPHONE HCL 1 MG/ML IJ SOLN
INTRAMUSCULAR | Status: AC
  Filled 2015-12-12: qty 1

## 2015-12-12 MED ORDER — ONDANSETRON HCL 4 MG PO TABS
4.0000 mg | ORAL_TABLET | Freq: Four times a day (QID) | ORAL | Status: DC | PRN
  Administered 2015-12-18: 4 mg via ORAL
  Filled 2015-12-12: qty 1

## 2015-12-12 MED ORDER — 0.9 % SODIUM CHLORIDE (POUR BTL) OPTIME
TOPICAL | Status: DC | PRN
  Administered 2015-12-12: 3000 mL

## 2015-12-12 MED ORDER — DIPHENHYDRAMINE HCL 50 MG/ML IJ SOLN: 25.0000 mg | Freq: Four times a day (QID) | INTRAMUSCULAR | Status: DC | PRN

## 2015-12-12 MED ORDER — LORAZEPAM 0.5 MG PO TABS
0.5000 mg | ORAL_TABLET | Freq: Every morning | ORAL | Status: DC
  Administered 2015-12-13 – 2015-12-19 (×7): 0.5 mg via ORAL
  Filled 2015-12-12 (×7): qty 1

## 2015-12-12 MED ORDER — PROMETHAZINE HCL 25 MG/ML IJ SOLN
6.2500 mg | INTRAMUSCULAR | Status: DC | PRN
Start: 2015-12-12 — End: 2015-12-12

## 2015-12-12 MED ORDER — DIPHENHYDRAMINE HCL 25 MG PO CAPS: 25.0000 mg | ORAL_CAPSULE | Freq: Four times a day (QID) | ORAL | Status: DC | PRN

## 2015-12-12 MED ORDER — ROCURONIUM BROMIDE 100 MG/10ML IV SOLN
INTRAVENOUS | Status: DC | PRN
  Administered 2015-12-12: 50 mg via INTRAVENOUS
  Administered 2015-12-12 (×2): 10 mg via INTRAVENOUS

## 2015-12-12 MED ORDER — ALBUTEROL SULFATE (2.5 MG/3ML) 0.083% IN NEBU: 2.5000 mg | INHALATION_SOLUTION | Freq: Four times a day (QID) | RESPIRATORY_TRACT | Status: DC | PRN

## 2015-12-12 MED ORDER — ONDANSETRON HCL 4 MG/2ML IJ SOLN
4.0000 mg | Freq: Four times a day (QID) | INTRAMUSCULAR | Status: DC | PRN
  Administered 2015-12-13 – 2015-12-17 (×4): 4 mg via INTRAVENOUS
  Filled 2015-12-12 (×4): qty 2

## 2015-12-12 MED ORDER — SERTRALINE HCL 100 MG PO TABS
100.0000 mg | ORAL_TABLET | Freq: Every morning | ORAL | Status: DC
  Administered 2015-12-13 – 2015-12-19 (×7): 100 mg via ORAL
  Filled 2015-12-12: qty 1
  Filled 2015-12-12: qty 2
  Filled 2015-12-12: qty 1
  Filled 2015-12-12: qty 2
  Filled 2015-12-12: qty 1
  Filled 2015-12-12: qty 2
  Filled 2015-12-12 (×2): qty 1
  Filled 2015-12-12: qty 2
  Filled 2015-12-12 (×2): qty 1
  Filled 2015-12-12 (×2): qty 2

## 2015-12-12 MED ORDER — VITAMIN B-12 100 MCG PO TABS: 100.0000 ug | ORAL_TABLET | Freq: Every day | ORAL | Status: DC

## 2015-12-12 MED ORDER — CEFOTETAN DISODIUM-DEXTROSE 2-2.08 GM-% IV SOLR
2.0000 g | INTRAVENOUS | Status: AC
  Administered 2015-12-12: 2 g via INTRAVENOUS

## 2015-12-12 MED ORDER — LIDOCAINE HCL (CARDIAC) 20 MG/ML IV SOLN
INTRAVENOUS | Status: DC | PRN
  Administered 2015-12-12: 100 mg via INTRAVENOUS

## 2015-12-12 MED ORDER — LABETALOL HCL 5 MG/ML IV SOLN
INTRAVENOUS | Status: DC | PRN
  Administered 2015-12-12 (×4): 2.5 mg via INTRAVENOUS

## 2015-12-12 MED ORDER — LACTATED RINGERS IV SOLN
INTRAVENOUS | Status: DC
  Administered 2015-12-12 (×3): via INTRAVENOUS

## 2015-12-12 SURGICAL SUPPLY — 63 items
BLADE HEX COATED 2.75 (ELECTRODE) ×4 IMPLANT
CELLS DAT CNTRL 66122 CELL SVR (MISCELLANEOUS) ×2 IMPLANT
CHLORAPREP W/TINT 26ML (MISCELLANEOUS) ×1 IMPLANT
COVER MAYO STAND STRL (DRAPES) ×8 IMPLANT
COVER SURGICAL LIGHT HANDLE (MISCELLANEOUS) ×2 IMPLANT
DRAPE LAPAROSCOPIC ABDOMINAL (DRAPES) ×4 IMPLANT
DRAPE UTILITY XL STRL (DRAPES) ×2 IMPLANT
DRAPE WARM FLUID 44X44 (DRAPE) ×1 IMPLANT
DRSG OPSITE POSTOP 4X10 (GAUZE/BANDAGES/DRESSINGS) IMPLANT
DRSG OPSITE POSTOP 4X6 (GAUZE/BANDAGES/DRESSINGS) IMPLANT
DRSG OPSITE POSTOP 4X8 (GAUZE/BANDAGES/DRESSINGS) ×2 IMPLANT
DRSG TELFA 3X8 NADH (GAUZE/BANDAGES/DRESSINGS) ×4 IMPLANT
ELECT REM PT RETURN 9FT ADLT (ELECTROSURGICAL) ×4
ELECTRODE REM PT RTRN 9FT ADLT (ELECTROSURGICAL) ×2 IMPLANT
EVACUATOR DRAINAGE 7X20 100CC (MISCELLANEOUS) IMPLANT
EVACUATOR SILICONE 100CC (MISCELLANEOUS) ×4
GAUZE SPONGE 4X4 12PLY STRL (GAUZE/BANDAGES/DRESSINGS) ×4 IMPLANT
GAUZE SPONGE 4X4 16PLY XRAY LF (GAUZE/BANDAGES/DRESSINGS) ×2 IMPLANT
GLOVE BIO SURGEON STRL SZ 6.5 (GLOVE) ×6 IMPLANT
GLOVE BIO SURGEONS STRL SZ 6.5 (GLOVE) ×2
GLOVE BIOGEL PI IND STRL 7.0 (GLOVE) ×2 IMPLANT
GLOVE BIOGEL PI INDICATOR 7.0 (GLOVE) ×2
GOWN STRL REUS W/TWL 2XL LVL3 (GOWN DISPOSABLE) ×4 IMPLANT
HANDLE SUCTION POOLE (INSTRUMENTS) ×1 IMPLANT
HOLDER FOLEY CATH W/STRAP (MISCELLANEOUS) ×3 IMPLANT
KIT BASIN OR (CUSTOM PROCEDURE TRAY) ×2 IMPLANT
LIGASURE IMPACT 36 18CM CVD LR (INSTRUMENTS) IMPLANT
MANIFOLD NEPTUNE II (INSTRUMENTS) ×4 IMPLANT
PACK COLON (CUSTOM PROCEDURE TRAY) ×2 IMPLANT
PACK GENERAL/GYN (CUSTOM PROCEDURE TRAY) ×2 IMPLANT
PAD DRESSING TELFA 3X8 NADH (GAUZE/BANDAGES/DRESSINGS) IMPLANT
RELOAD PROXIMATE TA60MM BLUE (ENDOMECHANICALS) ×4 IMPLANT
RELOAD STAPLE 60 BLU REG PROX (ENDOMECHANICALS) IMPLANT
RETRACTOR WND ALEXIS 18 MED (MISCELLANEOUS) IMPLANT
RTRCTR WOUND ALEXIS 18CM MED (MISCELLANEOUS) ×4
SPONGE DRAIN TRACH 4X4 STRL 2S (GAUZE/BANDAGES/DRESSINGS) ×3 IMPLANT
SPONGE LAP 18X18 X RAY DECT (DISPOSABLE) ×3 IMPLANT
STAPLER GUN LINEAR PROX 60 (STAPLE) ×3 IMPLANT
STAPLER PROXIMATE 75MM BLUE (STAPLE) ×2 IMPLANT
STAPLER VISISTAT 35W (STAPLE) ×2 IMPLANT
SUCTION POOLE HANDLE (INSTRUMENTS)
SUCTION POOLE TIP (SUCTIONS) ×3 IMPLANT
SUT ETHILON 2 0 PS N (SUTURE) ×3 IMPLANT
SUT NOVA NAB DX-16 0-1 5-0 T12 (SUTURE) ×4 IMPLANT
SUT PDS AB 1 CTX 36 (SUTURE) ×6 IMPLANT
SUT PDS AB 1 TP1 96 (SUTURE) IMPLANT
SUT PROLENE 2 0 BLUE (SUTURE) IMPLANT
SUT SILK 2 0 (SUTURE) ×4
SUT SILK 2 0 SH CR/8 (SUTURE) ×2 IMPLANT
SUT SILK 2-0 18XBRD TIE 12 (SUTURE) ×1 IMPLANT
SUT SILK 3 0 (SUTURE) ×4
SUT SILK 3 0 SH CR/8 (SUTURE) ×4 IMPLANT
SUT SILK 3-0 18XBRD TIE 12 (SUTURE) IMPLANT
SUT VIC AB 2-0 SH 18 (SUTURE) ×6 IMPLANT
SUT VIC AB 2-0 SH 27 (SUTURE)
SUT VIC AB 2-0 SH 27X BRD (SUTURE) IMPLANT
SUT VIC AB 4-0 PS2 18 (SUTURE) IMPLANT
TAPE CLOTH SURG 4X10 WHT LF (GAUZE/BANDAGES/DRESSINGS) ×2 IMPLANT
TOWEL OR 17X26 10 PK STRL BLUE (TOWEL DISPOSABLE) ×4 IMPLANT
TOWEL OR NON WOVEN STRL DISP B (DISPOSABLE) ×8 IMPLANT
TUBING CONNECTING 10 (TUBING) ×2 IMPLANT
TUBING CONNECTING 10' (TUBING) ×1
TUBING ENDO SMARTCAP PENTAX (MISCELLANEOUS) ×2 IMPLANT

## 2015-12-12 NOTE — Progress Notes (Signed)
PACU- Blood pressure- 144/ 76

## 2015-12-12 NOTE — Anesthesia Procedure Notes (Signed)
Procedure Name: Intubation Date/Time: 12/12/2015 9:57 AM Performed by: West Pugh Pre-anesthesia Checklist: Patient identified, Emergency Drugs available, Suction available, Patient being monitored and Timeout performed Patient Re-evaluated:Patient Re-evaluated prior to inductionOxygen Delivery Method: Circle system utilized Preoxygenation: Pre-oxygenation with 100% oxygen Intubation Type: IV induction Ventilation: Mask ventilation without difficulty Laryngoscope Size: Mac and 4 Grade View: Grade II Tube type: Oral Tube size: 7.0 mm Number of attempts: 1 Airway Equipment and Method: Stylet Placement Confirmation: ETT inserted through vocal cords under direct vision,  positive ETCO2,  CO2 detector and breath sounds checked- equal and bilateral Secured at: 22 cm Tube secured with: Tape Dental Injury: Teeth and Oropharynx as per pre-operative assessment

## 2015-12-12 NOTE — Transfer of Care (Signed)
Immediate Anesthesia Transfer of Care Note  Patient: Cathy Jones  Procedure(s) Performed: Procedure(s): OPEN ILEOSTOMY REVERSAL AND PELVIC WASHOUT (N/A) FLEXIBLE SIGMOIDOSCOPY  Patient Location: PACU  Anesthesia Type:General  Level of Consciousness: sedated  Airway & Oxygen Therapy: Patient Spontanous Breathing and Patient connected to face mask oxygen  Post-op Assessment: Report given to RN and Post -op Vital signs reviewed and stable  Post vital signs: Reviewed and stable  Last Vitals:  Vitals:   12/12/15 0652 12/12/15 1306  BP: 109/60 (!) 157/86  Pulse: 75 82  Resp: 16 (!) 8  Temp: 37 C 36.4 C    Last Pain:  Vitals:   12/12/15 0652  TempSrc: Oral      Patients Stated Pain Goal: 3 ([id number])  Complications: No apparent anesthesia complications

## 2015-12-12 NOTE — Anesthesia Postprocedure Evaluation (Signed)
Anesthesia Post Note  Patient: Cathy Jones  Procedure(s) Performed: Procedure(s) (LRB): OPEN ILEOSTOMY REVERSAL AND PELVIC WASHOUT (N/A) FLEXIBLE SIGMOIDOSCOPY  Patient location during evaluation: PACU Anesthesia Type: General Level of consciousness: awake and alert Pain management: pain level controlled Vital Signs Assessment: post-procedure vital signs reviewed and stable Respiratory status: spontaneous breathing, nonlabored ventilation, respiratory function stable and patient connected to nasal cannula oxygen Cardiovascular status: blood pressure returned to baseline and stable Postop Assessment: no signs of nausea or vomiting Anesthetic complications: no    Last Vitals:  Vitals:   12/12/15 1445 12/12/15 1516  BP: (!) 144/76 (!) 131/58  Pulse: 86 92  Resp: 16 14  Temp: 36.6 C 36.6 C    Last Pain:  Vitals:   12/12/15 1532  TempSrc:   PainSc: 5                  Arasely Akkerman J

## 2015-12-12 NOTE — Progress Notes (Signed)
PACU- Dr. Delma Post made aware of patient's blood pressures and heart rates in PACU- Orders given- Apresoline 5 mg IVP given as ordered.

## 2015-12-12 NOTE — Op Note (Addendum)
12/12/2015  12:51 PM  PATIENT:  Cathy Jones  55 y.o. female  Patient Care Team: Aletha Halim, PA-C as PCP - General (Family Medicine) Leighton Ruff, MD as Consulting Physician (General Surgery) Irene Shipper, MD as Consulting Physician (Gastroenterology)  PRE-OPERATIVE DIAGNOSIS:  pelivic abscess, ileostomy in place  POST-OPERATIVE DIAGNOSIS:  pelivic abscess, ileostomy in place  PROCEDURE:   OPEN ILEOSTOMY REVERSAL AND PELVIC WASHOUT FLEXIBLE SIGMOIDOSCOPY  Surgeon(s): Leighton Ruff, MD Michael Boston, MD  ASSISTANT: Dr Johney Maine   ANESTHESIA:   general  EBL:  Total I/O In: 2000 [I.V.:2000] Out: 270 [Urine:170; Blood:100]  DRAINS: (63F) Jackson-Pratt drain(s) with closed bulb suction in the pelvis   SPECIMEN:  Source of Specimen:  ileostomy  DISPOSITION OF SPECIMEN:  PATHOLOGY  COUNTS:  YES  PLAN OF CARE: Admit to inpatient   PATIENT DISPOSITION:  PACU - hemodynamically stable.  INDICATION: 55 y.o. F s/p LAR and diverting loop ostomy.  She developed a small leak postoperatively that was controlled by a CT-guided drain. This has since healed and she is ready for ileostomy reversal. She continues to have a small fluid collection in the pelvis which is concerning for possible residual abscess. We decided to perform an evaluation of this fluid collection and place a drain in this area.   OR FINDINGS: Intact anastomosis without any sign of leak. Small right-sided presacral pelvic fluid collection.  DESCRIPTION: the patient was identified in the preoperative holding area and taken to the OR where they were laid supine on the operating room table.  General anesthesia was induced without difficulty. SCDs were also noted to be in place prior to the initiation of anesthesia.  The patient was then prepped and draped in the usual sterile fashion.   A surgical timeout was performed indicating the correct patient, procedure, positioning and need for preoperative antibiotics.   I  began by reopening her previous incision at her lower midline. Dissection was carried down through the scar tissue using electrocautery. The fascia was incised at midline. The peritoneum was bluntly dissected away from underlying small bowel. I entered the abdomen and began mobilizing the omentum away from the abdominal wall using blunt dissection and electrocautery. Once the omentum was mobilized, I placed an Alexis wound protector and began to lyse adhesions of the small bowel down to the level of the right pelvic sidewall. Identified the fluid collection after mobilizing the overlying small bowel. This was irrigated and suctioned. There were no loculations noted. I then performed a flexible sigmoidoscopy to evaluate the patient's anastomosis. I insufflated this and no sign of leak was identified. The anastomosis appeared well healed and intact approximately 8 cm from anal verge. The colon was desufflated. I placed a 7 [place] drain into the fluid collection and brought this out through the left lower quadrant. It was sutured into place with a 2-0 nylon suture. I then divided the skin around the ileostomy site using electrocautery and blunt dissection. Dissection was carried down to the level of the fascia also using electrocautery. The bowel was then separated from the fascia using accommodation of sharp and blunt dissection. This was then brought through the fascial defect and into the midline wound. An anastomosis was created between the 2 loops of small bowel after all adhesions had been freed. This was done with a 75 mm GIA stapler blue load. I then used a 60 mm TA stapler to close the common enterotomy site. The remaining small bowel which formed the ileostomy was resected with  a second blue load 60 mm TA stapler. The edges of the mesentery were closed using interrupted 2-0 silk sutures. Hemostasis was good. An anti-tension suture was placed in the crotch of the anastomosis. Most of the anastomotic staple  line was also oversewed using interrupted 3-0 silk sutures. This was then placed back into the right lower quadrant. The omentum was then placed into the pelvis over the area of the drain. The abdomen was irrigated with saline. The Scott wound protector was removed.  We then switched to clean gowns, gloves, drapes and instruments. The fascia of the midline incision was closed using 2 running #1 PDS sutures. The ileostomy fascia was closed using interrupted 0 Novafil sutures.  I then closed the ileostomy subcutaneous tissue using interrupted pursestring 2-0 Vicryl sutures. A Teflon wick was placed in the middle and a dressing was applied. The midline wound was closed using interrupted 2-0 Vicryl for the subcutaneous layer and a 4-0 Vicryl running subcuticular suture for the skin. Sterile dressing was applied as well. The patient was then awakened from anesthesia and sent to the post anesthesia care unit in stable condition. All counts were correct per operating room staff.  I did confirm patency of fem-fem graft with a doppler at the end of the case.  Good signals noted throughout.

## 2015-12-12 NOTE — Anesthesia Preprocedure Evaluation (Signed)
Anesthesia Evaluation  Patient identified by MRN, date of birth, ID band Patient awake    Reviewed: Allergy & Precautions, NPO status , Patient's Chart, lab work & pertinent test results  History of Anesthesia Complications (+) Family history of anesthesia reaction  Airway Mallampati: II  TM Distance: >3 FB Neck ROM: Full    Dental no notable dental hx.    Pulmonary shortness of breath, asthma , former smoker,    Pulmonary exam normal breath sounds clear to auscultation       Cardiovascular hypertension, + Peripheral Vascular Disease  Normal cardiovascular exam Rhythm:Regular Rate:Normal     Neuro/Psych  Headaches, PSYCHIATRIC DISORDERS Depression    GI/Hepatic Neg liver ROS, hiatal hernia, GERD  ,  Endo/Other  negative endocrine ROS  Renal/GU Renal disease  negative genitourinary   Musculoskeletal negative musculoskeletal ROS (+)   Abdominal   Peds negative pediatric ROS (+)  Hematology  (+) anemia ,   Anesthesia Other Findings   Reproductive/Obstetrics negative OB ROS                             Anesthesia Physical Anesthesia Plan  ASA: III  Anesthesia Plan: General   Post-op Pain Management:    Induction: Intravenous  Airway Management Planned: Oral ETT  Additional Equipment:   Intra-op Plan:   Post-operative Plan: Extubation in OR  Informed Consent: I have reviewed the patients History and Physical, chart, labs and discussed the procedure including the risks, benefits and alternatives for the proposed anesthesia with the patient or authorized representative who has indicated his/her understanding and acceptance.   Dental advisory given  Plan Discussed with: CRNA  Anesthesia Plan Comments:         Anesthesia Quick Evaluation

## 2015-12-12 NOTE — H&P (Signed)
The patient is a 55 year old female who presents with colonic obstruction. Patient is referred by Dr. Scarlette Jones due to a colonic stricture. She has a history of total abdominal hysterectomy with pelvic lymphadenectomy for cervical cancer in 1994. This was followed with chemotherapy and radiation. She has not had any evidence of recurrence. Colonoscopy revealed rectal stricture. She has had a virtual colonoscopy and CT of the abdomen and pelvis, both of which revealed a short segment stricture in the distal sigmoid colon. She underwent an open low anterior resection with diverting ileostomy in mid February 2017. She was having some high ostomy outputs and we started her on some imodium for this. She was in the hospital multiple times after that due to uncontrollable ileostomy outputs and dehydration. During this time, she developed an aortic clot which embolizes and caused her to undergo emergent vascular surgery, resulting in a Fem-fem bypass. Repeat CT scan performed and showed a small pelvic abscess and most recent CT shows a cavity containing barium but I see no active contrast extravation. Initailly, a drain was placed by interventional radiology. Unfortunately, during this time her L>R Fem-Fem graft went down, and she had to undergo emergent thrombectomy. The drain was removed after a couple weeks, once no more leak was noticed with dye injection. After this we stopped her antibiotics and she developed high output ileostomy once again. I placed her on by mouth Augmentin and these symptoms resolved. She was on this for a month, but her symptoms recurred about 2 weeks after stopping this. It was restarted, and her symptoms initially resolved but then came back, on antibiotics. She was also giving herself IV fluids at home to help with her dehydration.  She was brought in the hospital and a CT with rectal contrast showed no signs of leak or obstruction.  There was a persistent R pelvic fluid  collection.   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 Migraine Headache Other disease, cancer, significant illness Gastroesophageal Reflux Disease  Past Surgical History Cathy Ruff, MD; [id number] 10:28 AM) Hysterectomy (due to cancer) - Complete Colon Polyp Removal - Colonoscopy LOW ANTERIOR RESECTION (44144) lysis of adhesions low anterior rescection diverting loop ileostomy; cystoscopy with catheter placement Sentinel Lymph Node Biopsy Oral Surgery  Diagnostic Studies History Cathy Ruff, MD; [id number] 10:28 AM) Mammogram within last year Colonoscopy within last year Pap Smear 1-5 years ago  Allergies Elbert Ewings, CMA; 12/03/2015 10:16 AM) Codeine Sulfate *ANALGESICS - OPIOID*  Medication History Elbert Ewings, CMA; 12/03/2015 10:16 AM) Zofran ODT (4MG  Tablet Disint, 1 (one) Tablet Disperse Oral every six hours, as needed, Taken starting 08/22/2015) Active. Loperamide HCl (2MG  Capsule, Oral) Active. Ondansetron HCl (4MG  Tablet, Oral) Active. LORazepam (0.5MG  Tablet, Oral) Active. Azor (5-20MG  Tablet, Oral) Active. ProAir HFA (108 (90 Base)MCG/ACT Aerosol Soln, Inhalation) Active. Sertraline HCl (100MG  Tablet, Oral) Active. Medications Reconciled  Social History Cathy Ruff, MD; [id number] 10:28 AM) Alcohol use Remotely quit alcohol use. Caffeine use Coffee, Tea. Tobacco use Current every day smoker. No drug use  Family History Cathy Ruff, MD; [id number] 10:28 AM) Thyroid problems  Father. Migraine Headache Daughter, Mother. Diabetes Mellitus Father. Melanoma Father. Depression Daughter, Mother. Hypertension Mother.  Arthritis Father, Mother. Colon Polyps Father, Mother.  Pregnancy / Birth History Cathy Ruff, MD; [id number] 10:28 AM) Regular periods 33 1 Age of menopause <46 Maternal age 46-25 Age at menarche 26 years. Contraceptive History Oral contraceptives. Gravida 1    Review of Systems Cathy Ruff MD; [id number] 10:29 AM) General Present- Weight Loss. Not Present- Appetite Loss, Chills, Fatigue, Fever, Night Sweats and Weight Gain. Skin Not Present- Change in Wart/Mole, Dryness, Hives, Jaundice, New Lesions, Non-Healing Wounds, Rash and Ulcer. HEENT Present- Seasonal Allergies and Wears glasses/contact lenses. Not Present- Earache, Hearing Loss, Hoarseness, Nose Bleed, Oral Ulcers, Ringing in the Ears, Sinus Pain, Sore Throat, Visual Disturbances and Yellow Eyes. Respiratory Present- Wheezing. Not Present- Bloody sputum, Chronic Cough, Difficulty Breathing and Snoring. Breast Not Present- Breast Mass, Breast Pain, Nipple Discharge and Skin Changes. Cardiovascular Present- Swelling of Extremities. Not Present- Chest Pain, Difficulty Breathing Lying Down, Leg Cramps, Palpitations, Rapid Heart Rate and Shortness of Breath. Gastrointestinal Present- Bloating, Nausea and Vomiting. Not Present- Abdominal Pain, Bloody Stool, Change in Bowel Habits, Chronic diarrhea, Constipation, Difficulty Swallowing, Excessive gas, Gets full quickly at meals, Hemorrhoids, Indigestion and Rectal Pain. Female Genitourinary Present- Urgency. Not Present- Frequency, Nocturia, Painful Urination and Pelvic Pain. Psychiatric Present- Anxiety and Change in Sleep Pattern. Not Present- Bipolar, Depression, Fearful and Frequent crying. Hematology Present- Easy Bruising. Not Present- Excessive bleeding, Gland problems, HIV and Persistent Infections.  BP 109/60    Pulse 75   Temp 98.6 F (37 C) (Oral)   Resp 16   Ht 5\' 2"  (1.575 m)   Wt 77.3 kg (170 lb 6 oz)   SpO2 99%   BMI 31.16 kg/m     Physical Exam Cathy Ruff MD; [id number] 10:29 AM)  General Mental Status-Alert. General Appearance-Consistent with stated age. Hydration-Well hydrated. Voice-Normal.  Chest and Lung Exam Chest and lung exam reveals -quiet, even and easy respiratory effort with no use of accessory muscles. Inspection Chest Wall - Normal. Back - normal.  Cardiovascular Cardiovascular examination reveals -normal heart sounds, regular rate and rhythm with no murmurs.  Abdomen Inspection  Inspection of the abdomen reveals: Note: ileostomy viable, healing well. Skin - Scar - Note: Incision: clean, dry, and intact. Palpation/Percussion Palpation and Percussion of the abdomen reveal - Soft, Non Tender, No Rebound tenderness and No Rigidity (guarding). Note: no distention.   Assessment & Plan   PELVIC ABSCESS IN FEMALE (N73.9) Impression:  I am concerned that she has ongoing abscess activity in the pelvis, given her wbc elevation when off antibiotics.   I have recommended open drainage of pelvic fluid collection, given her last trip to IR resulted in blockage of her fem-fem graft.  We will plan on evaluating the anastomosis endoscopically as well.  As long as no further issues are found, we will plan on reversing her ileostomy at the end of the case.

## 2015-12-13 LAB — CBC
HCT: 38.4 % (ref 36.0–46.0)
Hemoglobin: 12.3 g/dL (ref 12.0–15.0)
MCH: 26.5 pg (ref 26.0–34.0)
MCHC: 32 g/dL (ref 30.0–36.0)
MCV: 82.6 fL (ref 78.0–100.0)
PLATELETS: 172 10*3/uL (ref 150–400)
RBC: 4.65 MIL/uL (ref 3.87–5.11)
RDW: 15.2 % (ref 11.5–15.5)
WBC: 14.8 10*3/uL — ABNORMAL HIGH (ref 4.0–10.5)

## 2015-12-13 LAB — BASIC METABOLIC PANEL
Anion gap: 3 — ABNORMAL LOW (ref 5–15)
BUN: 11 mg/dL (ref 6–20)
CALCIUM: 8.5 mg/dL — AB (ref 8.9–10.3)
CO2: 27 mmol/L (ref 22–32)
CREATININE: 0.64 mg/dL (ref 0.44–1.00)
Chloride: 108 mmol/L (ref 101–111)
GFR calc Af Amer: >60 mL/min (ref >60)
GLUCOSE: 129 mg/dL — AB (ref 65–99)
Potassium: 4.2 mmol/L (ref 3.5–5.1)
Sodium: 138 mmol/L (ref 135–145)

## 2015-12-13 MED ORDER — HYDROMORPHONE HCL 1 MG/ML IJ SOLN
0.5000 mg | INTRAMUSCULAR | Status: DC | PRN
  Administered 2015-12-13 – 2015-12-18 (×24): 1 mg via INTRAVENOUS
  Filled 2015-12-13 (×24): qty 1

## 2015-12-13 MED ORDER — PHENOL 1.4 % MT LIQD
2.0000 | OROMUCOSAL | Status: DC | PRN
  Administered 2015-12-13: 2 via OROMUCOSAL
  Filled 2015-12-13: qty 177

## 2015-12-13 MED ORDER — ACETAMINOPHEN 10 MG/ML IV SOLN
1000.0000 mg | Freq: Four times a day (QID) | INTRAVENOUS | Status: AC
  Administered 2015-12-13 – 2015-12-14 (×4): 1000 mg via INTRAVENOUS
  Filled 2015-12-13 (×5): qty 100

## 2015-12-13 NOTE — Progress Notes (Signed)
1 Day Post-Op open ileostomy takedown and drainage of pelvic fluid collection Subjective: Feels sore, morphine not working well  Objective: Vital signs in last 24 hours: Temp:  [97.6 F (36.4 C)-98.2 F (36.8 C)] 97.8 F (36.6 C) (08/03 0622) Pulse Rate:  [81-92] 81 (08/03 0622) Resp:  [8-16] 16 (08/03 0622) BP: (115-193)/(58-98) 124/67 (08/03 0622) SpO2:  [95 %-100 %] 98 % (08/03 0622)   Intake/Output from previous day: 08/02 0701 - 08/03 0700 In: 4332 [I.V.:4302; NG/GT:30] Out: 990 [Urine:230; Emesis/NG output:345; Drains:315; Blood:100] Intake/Output this shift: No intake/output data recorded.   General appearance: alert and cooperative GI: soft, non-distended JP: SS fluid  Incision: no significant drainage  Lab Results:   Recent Labs  12/10/15 1200 12/13/15 0405  WBC 12.3* 14.8*  HGB 14.3 12.3  HCT 43.9 38.4  PLT 183 172   BMET  Recent Labs  12/10/15 1200 12/13/15 0405  NA 137 138  K 4.6 4.2  CL 112* 108  CO2 22 27  GLUCOSE 76 129*  BUN 9 11  CREATININE 0.65 0.64  CALCIUM 8.9 8.5*   PT/INR No results for input(s): LABPROT, INR in the last 72 hours. ABG No results for input(s): PHART, HCO3 in the last 72 hours.  Invalid input(s): PCO2, PO2  MEDS, Scheduled . acetaminophen  1,000 mg Intravenous Q6H  . antiseptic oral rinse  7 mL Mouth Rinse q12n4p  . chlorhexidine  15 mL Mouth Rinse BID  . diphenoxylate-atropine  2 tablet Oral BID  . enoxaparin (LOVENOX) injection  40 mg Subcutaneous Q24H  . famotidine  20 mg Oral BID  . LORazepam  0.5 mg Oral q morning - 10a  . sertraline  100 mg Oral q morning - 10a  . vitamin B-12  100 mcg Oral Daily    Studies/Results: Dg Chest Port 1 View  Result Date: 12/12/2015 CLINICAL DATA:  PICC line position EXAM: PORTABLE CHEST 1 VIEW COMPARISON:  08/23/2015 FINDINGS: Lungs are clear.  No pleural effusion or pneumothorax. The heart is normal in size. Right arm PICC terminates at the cavoatrial junction.  Enteric tube is looped the stomach and terminates in the mid gastric body. IMPRESSION: Right arm PICC terminates at the cavoatrial junction. Electronically Signed   By: Julian Hy M.D.   On: 12/12/2015 20:10    Assessment: s/p Procedure(s): OPEN ILEOSTOMY REVERSAL AND PELVIC WASHOUT FLEXIBLE SIGMOIDOSCOPY Patient Active Problem List   Diagnosis Date Noted  . Malnutrition of moderate degree 09/06/2015  . Dehydration 08/23/2015  . PAD (peripheral artery disease) ([place]) 08/10/2015  . Arterial occlusion ([place]) 08/07/2015  . Leukocytosis 08/07/2015  . Acute renal failure (ARF) ([place]) 08/07/2015  . High output ileostomy ([place]) 08/04/2015  . B12 deficiency anemia 08/02/2015  . Folic acid deficiency anemia 08/02/2015  . Hyperlipidemia 07/30/2015  . Depression 07/30/2015  . Ileostomy in place [facility]) 07/30/2015  . 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 Rectal stricture s/p LAR resection [id number] 06/27/2015    Expected post op course  Plan: d/c foley  Cont NG, await return of bowel function Ambulate Switch to Dilaudid and IV tylenol for better pain control   LOS: 1 day     .Rosario Adie, [facility], [place]   12/13/2015 8:34 AM

## 2015-12-13 NOTE — Progress Notes (Signed)
[facility]  Patient Status: Active (receiving services up to time of hospitalization)  [facility] is providing the following services: RN and PT  If patient discharges after hours, please call [phone].   Cathy Jones 12/13/2015, 4:14 PM

## 2015-12-14 LAB — BASIC METABOLIC PANEL
Anion gap: 4 — ABNORMAL LOW (ref 5–15)
BUN: 7 mg/dL (ref 6–20)
CALCIUM: 8.3 mg/dL — AB (ref 8.9–10.3)
CO2: 29 mmol/L (ref 22–32)
CREATININE: 0.5 mg/dL (ref 0.44–1.00)
Chloride: 102 mmol/L (ref 101–111)
Glucose, Bld: 104 mg/dL — ABNORMAL HIGH (ref 65–99)
Potassium: 3.8 mmol/L (ref 3.5–5.1)
Sodium: 135 mmol/L (ref 135–145)

## 2015-12-14 LAB — CBC
HCT: 36 % (ref 36.0–46.0)
Hemoglobin: 11.5 g/dL — ABNORMAL LOW (ref 12.0–15.0)
MCH: 26.6 pg (ref 26.0–34.0)
MCHC: 31.9 g/dL (ref 30.0–36.0)
MCV: 83.1 fL (ref 78.0–100.0)
Platelets: 175 10*3/uL (ref 150–400)
RBC: 4.33 MIL/uL (ref 3.87–5.11)
RDW: 15.1 % (ref 11.5–15.5)
WBC: 11.5 10*3/uL — ABNORMAL HIGH (ref 4.0–10.5)

## 2015-12-14 NOTE — Progress Notes (Signed)
2 Days Post-Op open ileostomy takedown and drainage of pelvic fluid collection Subjective: Less sore, had a BM.    Objective: Vital signs in last 24 hours: Temp:  [98.2 F (36.8 C)-99 F (37.2 C)] 98.3 F (36.8 C) (08/04 0557) Pulse Rate:  [86-113] 92 (08/04 0557) Resp:  [18] 18 (08/04 0557) BP: (142-143)/(69-79) 142/79 (08/04 0557) SpO2:  [96 %-97 %] 96 % (08/04 0557)   Intake/Output from previous day: 08/03 0701 - 08/04 0700 In: 2518.3 [P.O.:120; I.V.:2398.3] Out: 2162 [Urine:1700; Emesis/NG output:400; Drains:62] Intake/Output this shift: No intake/output data recorded.   General appearance: alert and cooperative GI: soft, non-distended JP: SS fluid  Incision: no significant drainage  Lab Results:   Recent Labs  12/13/15 0405 12/14/15 0522  WBC 14.8* 11.5*  HGB 12.3 11.5*  HCT 38.4 36.0  PLT 172 175   BMET  Recent Labs  12/13/15 0405 12/14/15 0522  NA 138 135  K 4.2 3.8  CL 108 102  CO2 27 29  GLUCOSE 129* 104*  BUN 11 7  CREATININE 0.64 0.50  CALCIUM 8.5* 8.3*   PT/INR No results for input(s): LABPROT, INR in the last 72 hours. ABG No results for input(s): PHART, HCO3 in the last 72 hours.  Invalid input(s): PCO2, PO2  MEDS, Scheduled . antiseptic oral rinse  7 mL Mouth Rinse q12n4p  . chlorhexidine  15 mL Mouth Rinse BID  . enoxaparin (LOVENOX) injection  40 mg Subcutaneous Q24H  . famotidine  20 mg Oral BID  . LORazepam  0.5 mg Oral q morning - 10a  . sertraline  100 mg Oral q morning - 10a  . vitamin B-12  100 mcg Oral Daily    Studies/Results: Dg Chest Port 1 View  Result Date: 12/12/2015 CLINICAL DATA:  PICC line position EXAM: PORTABLE CHEST 1 VIEW COMPARISON:  08/23/2015 FINDINGS: Lungs are clear.  No pleural effusion or pneumothorax. The heart is normal in size. Right arm PICC terminates at the cavoatrial junction. Enteric tube is looped the stomach and terminates in the mid gastric body. IMPRESSION: Right arm PICC terminates at  the cavoatrial junction. Electronically Signed   By: Julian Hy M.D.   On: 12/12/2015 20:10    Assessment: s/p Procedure(s): OPEN ILEOSTOMY REVERSAL AND PELVIC WASHOUT FLEXIBLE SIGMOIDOSCOPY Patient Active Problem List   Diagnosis Date Noted  . Malnutrition of moderate degree 09/06/2015  . Dehydration 08/23/2015  . PAD (peripheral artery disease) ([place]) 08/10/2015  . Arterial occlusion ([place]) 08/07/2015  . Leukocytosis 08/07/2015  . Acute renal failure (ARF) ([place]) 08/07/2015  . High output ileostomy ([place]) 08/04/2015  . B12 deficiency anemia 08/02/2015  . Folic acid deficiency anemia 08/02/2015  . Hyperlipidemia 07/30/2015  . Depression 07/30/2015  . Ileostomy in place [facility]) 07/30/2015  . 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 Rectal stricture s/p LAR resection [id number] 06/27/2015    Expected post op course  Plan: D/c NG, start clears Ambulate Cont Dilaudid and IV tylenol for pain control.  PO narcotics when tolerating FLD   LOS: 2 days     .Rosario Adie, [facility], [place]   12/14/2015 8:40 AM

## 2015-12-14 NOTE — Progress Notes (Signed)
Ambulated 500 feet without incident

## 2015-12-15 LAB — CBC
HEMATOCRIT: 35.1 % — AB (ref 36.0–46.0)
HEMOGLOBIN: 11.2 g/dL — AB (ref 12.0–15.0)
MCH: 26.4 pg (ref 26.0–34.0)
MCHC: 31.9 g/dL (ref 30.0–36.0)
MCV: 82.8 fL (ref 78.0–100.0)
Platelets: 176 10*3/uL (ref 150–400)
RBC: 4.24 MIL/uL (ref 3.87–5.11)
RDW: 15.1 % (ref 11.5–15.5)
WBC: 8.9 10*3/uL (ref 4.0–10.5)

## 2015-12-15 LAB — BASIC METABOLIC PANEL
ANION GAP: 5 (ref 5–15)
CALCIUM: 8.3 mg/dL — AB (ref 8.9–10.3)
CO2: 29 mmol/L (ref 22–32)
Chloride: 103 mmol/L (ref 101–111)
Creatinine, Ser: 0.55 mg/dL (ref 0.44–1.00)
GFR calc Af Amer: >60 mL/min (ref >60)
Glucose, Bld: 105 mg/dL — ABNORMAL HIGH (ref 65–99)
POTASSIUM: 3.4 mmol/L — AB (ref 3.5–5.1)
SODIUM: 137 mmol/L (ref 135–145)

## 2015-12-15 NOTE — Progress Notes (Signed)
Patient ID: Cathy Jones, female   DOB: 28-Aug-1960, 55 y.o.   MRN: [id number] 3 Days Post-Op  Subjective: Doing well this morning and in good spirits. Some incisional pain  That is stable. Passing flatus. Tolerating clear liquids without nausea.  Objective: Vital signs in last 24 hours: Temp:  [98.2 F (36.8 C)-98.7 F (37.1 C)] 98.2 F (36.8 C) (08/05 0540) Pulse Rate:  [84-94] 84 (08/05 0540) Resp:  [18] 18 (08/05 0540) BP: (124-153)/(63-79) 124/63 (08/05 0540) SpO2:  [94 %-100 %] 100 % (08/05 0540) Last BM Date: 12/14/15  Intake/Output from previous day: 08/04 0701 - 08/05 0700 In: 3112.9 [P.O.:1200; I.V.:1912.9] Out: 2925 [Urine:2450; Emesis/NG output:350; Drains:125] Intake/Output this shift: No intake/output data recorded.  General appearance: alert, cooperative and no distress GI: mmild appropriate lower abdominal tenderness. Nondistended. Incision/Wound: no erythema or unusual drainage.  JP drain  blood-tinged serous  Lab Results:   Recent Labs  12/14/15 0522 12/15/15 0500  WBC 11.5* 8.9  HGB 11.5* 11.2*  HCT 36.0 35.1*  PLT 175 176   BMET  Recent Labs  12/14/15 0522 12/15/15 0500  NA 135 137  K 3.8 3.4*  CL 102 103  CO2 29 29  GLUCOSE 104* 105*  BUN 7 <5*  CREATININE 0.50 0.55  CALCIUM 8.3* 8.3*     Studies/Results: No results found.  Anti-infectives: Anti-infectives    Start     Dose/Rate Route Frequency Ordered Stop   12/12/15 2200  cefoTEtan (CEFOTAN) 2 g in dextrose 5 % 50 mL IVPB     2 g 100 mL/hr over 30 Minutes Intravenous Every 12 hours 12/12/15 1531 12/12/15 2209   12/12/15 0707  cefoTEtan in Dextrose 5% (CEFOTAN) IVPB 2 g     2 g Intravenous On call to O.R. 12/12/15 0707 12/12/15 1005      Assessment/Plan: s/p Procedure(s): OPEN ILEOSTOMY REVERSAL AND PELVIC WASHOUT FLEXIBLE SIGMOIDOSCOPY Doing well postoperatively without apparent complication. Advance to full liquid diet. Ambulation encouraged.   LOS: 3 days     Coleta Grosshans T 12/15/2015

## 2015-12-16 NOTE — Progress Notes (Signed)
Patient ID: KAYLIANN HRNCIR, female   DOB: Jun 11, 1960, 55 y.o.   MRN: [id number] 4 Days Post-Op  Subjective: Some pain around the incision with activity. No other complaints. Tolerating full liquid diet. Had several loose bowel movements.  Objective: Vital signs in last 24 hours: Temp:  [98.3 F (36.8 C)-98.9 F (37.2 C)] 98.3 F (36.8 C) (08/06 0610) Pulse Rate:  [73-86] 73 (08/06 0610) Resp:  [17-18] 18 (08/06 0610) BP: (107-140)/(59-90) 107/59 (08/06 0610) SpO2:  [96 %-99 %] 96 % (08/06 0610) Last BM Date: 12/15/15  Intake/Output from previous day: 08/05 0701 - 08/06 0700 In: 2160 [P.O.:1560; I.V.:600] Out: 1138 [Urine:1010; Drains:128] Intake/Output this shift: No intake/output data recorded.  General appearance: alert, cooperative and no distress GI: normal findings: soft, non-tender and nondistended. JP drainage serous Incision/Wound: no erythema or drainage midline wound. Old ostomy site with slight bloody drainage  Lab Results:   Recent Labs  12/14/15 0522 12/15/15 0500  WBC 11.5* 8.9  HGB 11.5* 11.2*  HCT 36.0 35.1*  PLT 175 176   BMET  Recent Labs  12/14/15 0522 12/15/15 0500  NA 135 137  K 3.8 3.4*  CL 102 103  CO2 29 29  GLUCOSE 104* 105*  BUN 7 <5*  CREATININE 0.50 0.55  CALCIUM 8.3* 8.3*     Studies/Results: No results found.  Anti-infectives: Anti-infectives    Start     Dose/Rate Route Frequency Ordered Stop   12/12/15 2200  cefoTEtan (CEFOTAN) 2 g in dextrose 5 % 50 mL IVPB     2 g 100 mL/hr over 30 Minutes Intravenous Every 12 hours 12/12/15 1531 12/12/15 2209   12/12/15 0707  cefoTEtan in Dextrose 5% (CEFOTAN) IVPB 2 g     2 g Intravenous On call to O.R. 12/12/15 0707 12/12/15 1005      Assessment/Plan: s/p Procedure(s): OPEN ILEOSTOMY REVERSAL AND PELVIC WASHOUT FLEXIBLE SIGMOIDOSCOPY Doing well without apparent problems. Advance to soft diet.    LOS: 4 days    Machel Violante T 12/16/2015

## 2015-12-16 NOTE — Care Management Note (Signed)
Case Management Note  Patient Details  Name: Cathy Jones MRN: [id number] Date of Birth: 1960/10/29  Subjective/Objective:     Open Ileostomy Reversal and Pelvic Washout, Flexible Sigmoidoscopy               Action/Plan: Discharge Planning: Chart reviewed. Pt active with [facility] for [facility] RN and IV abx. Will need resumption of care orders.   Expected Discharge Date:                  Expected Discharge Plan:  [place]  In-House Referral:  NA  Discharge planning Services  CM Consult  Post Acute Care Choice:  Home Health, Resumption of Svcs/PTA Provider Choice offered to:  Patient  DME Arranged:    DME Agency:     HH Arranged:  RN, PT, IV Antibiotics HH Agency:  [place]  Status of Service:  In process, will continue to follow  If discussed at Long Length of Stay Meetings, dates discussed:    Additional Comments:  Erenest Rasher, RN 12/16/2015, 4:45 PM

## 2015-12-17 MED ORDER — LOPERAMIDE HCL 2 MG PO CAPS
2.0000 mg | ORAL_CAPSULE | Freq: Three times a day (TID) | ORAL | Status: DC | PRN
  Administered 2015-12-17 – 2015-12-18 (×3): 2 mg via ORAL
  Filled 2015-12-17 (×3): qty 1

## 2015-12-17 MED ORDER — OXYCODONE HCL 5 MG PO TABS
5.0000 mg | ORAL_TABLET | ORAL | Status: DC | PRN
  Administered 2015-12-17 – 2015-12-18 (×3): 5 mg via ORAL
  Administered 2015-12-18 (×2): 10 mg via ORAL
  Administered 2015-12-19 (×2): 5 mg via ORAL
  Filled 2015-12-17: qty 1
  Filled 2015-12-17: qty 2
  Filled 2015-12-17 (×3): qty 1
  Filled 2015-12-17: qty 2
  Filled 2015-12-17: qty 1

## 2015-12-17 NOTE — Progress Notes (Addendum)
5 Days Post-Op open ileostomy takedown and drainage of pelvic fluid collection Subjective: Having nausea, vomiting and diarrhea.    Objective: Vital signs in last 24 hours: Temp:  [97.5 F (36.4 C)-98.2 F (36.8 C)] 97.9 F (36.6 C) (08/07 0514) Pulse Rate:  [78-90] 86 (08/07 0514) Resp:  [18] 18 (08/07 0514) BP: (96-146)/(62-66) 140/66 (08/07 0514) SpO2:  [97 %-100 %] 97 % (08/07 0514)   Intake/Output from previous day: 08/06 0701 - 08/07 0700 In: 2288.8 [P.O.:480; I.V.:1808.8] Out: 48 [Drains:48] Intake/Output this shift: No intake/output data recorded.   General appearance: alert and cooperative GI: soft, non-distended JP: SS fluid  Incision: no significant drainage  Lab Results:   Recent Labs  12/15/15 0500  WBC 8.9  HGB 11.2*  HCT 35.1*  PLT 176   BMET  Recent Labs  12/15/15 0500  NA 137  K 3.4*  CL 103  CO2 29  GLUCOSE 105*  BUN <5*  CREATININE 0.55  CALCIUM 8.3*   PT/INR No results for input(s): LABPROT, INR in the last 72 hours. ABG No results for input(s): PHART, HCO3 in the last 72 hours.  Invalid input(s): PCO2, PO2  MEDS, Scheduled . antiseptic oral rinse  7 mL Mouth Rinse q12n4p  . chlorhexidine  15 mL Mouth Rinse BID  . enoxaparin (LOVENOX) injection  40 mg Subcutaneous Q24H  . famotidine  20 mg Oral BID  . LORazepam  0.5 mg Oral q morning - 10a  . sertraline  100 mg Oral q morning - 10a  . vitamin B-12  100 mcg Oral Daily    Studies/Results: No results found.  Assessment: s/p Procedure(s): OPEN ILEOSTOMY REVERSAL AND PELVIC WASHOUT FLEXIBLE SIGMOIDOSCOPY Patient Active Problem List   Diagnosis Date Noted  . Malnutrition of moderate degree 09/06/2015  . Dehydration 08/23/2015  . PAD (peripheral artery disease) ([place]) 08/10/2015  . Arterial occlusion ([place]) 08/07/2015  . Leukocytosis 08/07/2015  . Acute renal failure (ARF) ([place]) 08/07/2015  . High output ileostomy ([place]) 08/04/2015  . B12 deficiency anemia 08/02/2015  .  Folic acid deficiency anemia 08/02/2015  . Hyperlipidemia 07/30/2015  . Depression 07/30/2015  . Ileostomy in place [facility]) 07/30/2015  . 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 Rectal stricture s/p LAR resection [id number] 06/27/2015    Expected post op course  Plan: Diarrhea is expected and most likely due to diversion colitis.  Nausea is not a new problem for her.  Cont soft diet as tolerated Cont IVF's Recheck labs in AM Imodium and Zofran as needed Cont to ambulate    LOS: 5 days     .Rosario Adie, [facility], [place]   12/17/2015 8:29 AM

## 2015-12-18 ENCOUNTER — Encounter (HOSPITAL_COMMUNITY): Payer: Self-pay | Admitting: General Surgery

## 2015-12-18 LAB — BASIC METABOLIC PANEL
Anion gap: 5 (ref 5–15)
CHLORIDE: 106 mmol/L (ref 101–111)
CO2: 28 mmol/L (ref 22–32)
Calcium: 8.3 mg/dL — ABNORMAL LOW (ref 8.9–10.3)
Creatinine, Ser: 0.62 mg/dL (ref 0.44–1.00)
GFR calc Af Amer: >60 mL/min (ref >60)
GFR calc non Af Amer: >60 mL/min (ref >60)
Glucose, Bld: 104 mg/dL — ABNORMAL HIGH (ref 65–99)
POTASSIUM: 3.2 mmol/L — AB (ref 3.5–5.1)
SODIUM: 139 mmol/L (ref 135–145)

## 2015-12-18 LAB — CBC
HEMATOCRIT: 34.8 % — AB (ref 36.0–46.0)
HEMOGLOBIN: 11.2 g/dL — AB (ref 12.0–15.0)
MCH: 26.6 pg (ref 26.0–34.0)
MCHC: 32.2 g/dL (ref 30.0–36.0)
MCV: 82.7 fL (ref 78.0–100.0)
Platelets: 231 10*3/uL (ref 150–400)
RBC: 4.21 MIL/uL (ref 3.87–5.11)
RDW: 14.8 % (ref 11.5–15.5)
WBC: 9.5 10*3/uL (ref 4.0–10.5)

## 2015-12-18 MED ORDER — ENSURE ENLIVE PO LIQD
237.0000 mL | Freq: Two times a day (BID) | ORAL | Status: DC
  Administered 2015-12-18: 237 mL via ORAL

## 2015-12-18 MED ORDER — POTASSIUM CHLORIDE CRYS ER 20 MEQ PO TBCR
40.0000 meq | EXTENDED_RELEASE_TABLET | Freq: Once | ORAL | Status: AC
  Administered 2015-12-18: 40 meq via ORAL
  Filled 2015-12-18: qty 2

## 2015-12-18 MED ORDER — SODIUM CHLORIDE 0.9% FLUSH: 3.0000 mL | INTRAVENOUS | Status: DC | PRN

## 2015-12-18 MED ORDER — SODIUM CHLORIDE 0.9% FLUSH: 3.0000 mL | Freq: Two times a day (BID) | INTRAVENOUS | Status: DC

## 2015-12-18 NOTE — Progress Notes (Signed)
[facility]  Patient Status:  Active pt with [facility] prior to this admission  [facility] is providing the following services: HHRN and [facility] for home IV ABX. Home IV ABX orders at time of readmission  Invanz 1gm Q24hrs  thru 12/27/15.  [facility] hospital team will follow Ms. Harkness this admission to support transition home when ordered.   If patient discharges after hours, please call [phone].   Larry Sierras 12/18/2015, 8:23 AM

## 2015-12-18 NOTE — Progress Notes (Signed)
6 Days Post-Op open ileostomy takedown and drainage of pelvic fluid collection Subjective: Nausea better.  Diarrhea better with imodium  Objective: Vital signs in last 24 hours: Temp:  [98.1 F (36.7 C)-98.7 F (37.1 C)] 98.1 F (36.7 C) (08/08 0543) Pulse Rate:  [63-79] 63 (08/08 0543) Resp:  [18-19] 18 (08/08 0543) BP: (118-155)/(47-82) 118/54 (08/08 0543) SpO2:  [96 %-100 %] 96 % (08/08 0543)   Intake/Output from previous day: 08/07 0701 - 08/08 0700 In: 1785 [P.O.:960; I.V.:825] Out: 15 [Urine:3; Drains:12] Intake/Output this shift: No intake/output data recorded.   General appearance: alert and cooperative GI: soft, non-distended JP: SS fluid  Incision: no significant drainage  Lab Results:   Recent Labs  12/18/15 0410  WBC 9.5  HGB 11.2*  HCT 34.8*  PLT 231   BMET  Recent Labs  12/18/15 0410  NA 139  K 3.2*  CL 106  CO2 28  GLUCOSE 104*  BUN <5*  CREATININE 0.62  CALCIUM 8.3*   PT/INR No results for input(s): LABPROT, INR in the last 72 hours. ABG No results for input(s): PHART, HCO3 in the last 72 hours.  Invalid input(s): PCO2, PO2  MEDS, Scheduled . antiseptic oral rinse  7 mL Mouth Rinse q12n4p  . chlorhexidine  15 mL Mouth Rinse BID  . enoxaparin (LOVENOX) injection  40 mg Subcutaneous Q24H  . famotidine  20 mg Oral BID  . feeding supplement (ENSURE ENLIVE)  237 mL Oral BID BM  . LORazepam  0.5 mg Oral q morning - 10a  . potassium chloride  40 mEq Oral Once  . sertraline  100 mg Oral q morning - 10a  . sodium chloride flush  3 mL Intravenous Q12H  . vitamin B-12  100 mcg Oral Daily    Studies/Results: No results found.  Assessment: s/p Procedure(s): OPEN ILEOSTOMY REVERSAL AND PELVIC WASHOUT FLEXIBLE SIGMOIDOSCOPY Patient Active Problem List   Diagnosis Date Noted  . Malnutrition of moderate degree 09/06/2015  . Dehydration 08/23/2015  . PAD (peripheral artery disease) ([place]) 08/10/2015  . Arterial occlusion ([place])  08/07/2015  . Leukocytosis 08/07/2015  . Acute renal failure (ARF) ([place]) 08/07/2015  . High output ileostomy ([place]) 08/04/2015  . B12 deficiency anemia 08/02/2015  . Folic acid deficiency anemia 08/02/2015  . Hyperlipidemia 07/30/2015  . Depression 07/30/2015  . Ileostomy in place [facility]) 07/30/2015  . 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 Rectal stricture s/p LAR resection [id number] 06/27/2015    Expected post op course  Plan: Diarrhea is expected and most likely due to diversion colitis.  Nausea is not a new problem for her.  Cont soft diet as tolerated, will add ensure as she feels like she does better with liquids D/C IVF's Imodium and Zofran as needed Cont to ambulate    LOS: 6 days     .Rosario Adie, MD [facility], [place]   12/18/2015 8:14 AM

## 2015-12-19 MED ORDER — OXYCODONE HCL 5 MG PO TABS: 5.0000 mg | ORAL_TABLET | ORAL | 0 refills | Status: DC | PRN

## 2015-12-19 MED ORDER — ONDANSETRON HCL 4 MG PO TABS: 4.0000 mg | ORAL_TABLET | Freq: Four times a day (QID) | ORAL | 0 refills | Status: DC | PRN

## 2015-12-19 MED ORDER — LOPERAMIDE HCL 2 MG PO CAPS: 2.0000 mg | ORAL_CAPSULE | Freq: Three times a day (TID) | ORAL | 0 refills | Status: DC | PRN

## 2015-12-19 NOTE — Progress Notes (Signed)
Spoke with patient and nurse, no need for [facility] services at d/c. Contacted [facility] to notify them as well. [phone]

## 2015-12-19 NOTE — Discharge Instructions (Signed)

## 2015-12-19 NOTE — Progress Notes (Signed)
Discharge instructions discussed with patient until no further questions ask. Marland Kitchen Discharged ambulatory to private car.

## 2015-12-19 NOTE — Discharge Summary (Signed)
  Patient ID: Cathy Jones [id number] 55 y.o. 10/22/1960  12/12/2015  Discharge date and time: No discharge date for patient encounter.  Admitting Physician: Rosario Adie  Discharge Physician: Rosario Adie.  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  Her diet was advanced as tolerated.  She was discharged to home when tolerating a PO diet and her pain was controlled.  Consults: None  Significant Diagnostic Studies: labs: cbc, chemistry  Treatments: IV hydration, analgesia: Morphine and surgery: see above  Disposition: Home

## 2016-01-24 ENCOUNTER — Encounter: Payer: Self-pay | Admitting: Vascular Surgery

## 2016-01-26 ENCOUNTER — Other Ambulatory Visit: Payer: Self-pay | Admitting: *Deleted

## 2016-01-26 DIAGNOSIS — I739 Peripheral vascular disease, unspecified: Secondary | ICD-10-CM

## 2016-01-26 DIAGNOSIS — Z48812 Encounter for surgical aftercare following surgery on the circulatory system: Secondary | ICD-10-CM

## 2016-01-29 ENCOUNTER — Ambulatory Visit (INDEPENDENT_AMBULATORY_CARE_PROVIDER_SITE_OTHER): Payer: BLUE CROSS/BLUE SHIELD | Admitting: Vascular Surgery

## 2016-01-29 ENCOUNTER — Encounter: Payer: Self-pay | Admitting: Vascular Surgery

## 2016-01-29 ENCOUNTER — Ambulatory Visit (HOSPITAL_COMMUNITY)
Admission: RE | Admit: 2016-01-29 | Discharge: 2016-01-29 | Disposition: A | Discharge status: Home / Self Care | Payer: BLUE CROSS/BLUE SHIELD | Source: Ambulatory Visit | Attending: Vascular Surgery | Admitting: Vascular Surgery

## 2016-01-29 VITALS — BP 144/71 | HR 77 | Ht 62.0 in | Wt 168.0 lb

## 2016-01-29 DIAGNOSIS — I739 Peripheral vascular disease, unspecified: Secondary | ICD-10-CM

## 2016-01-29 DIAGNOSIS — Z48812 Encounter for surgical aftercare following surgery on the circulatory system: Secondary | ICD-10-CM | POA: Diagnosis not present

## 2016-01-29 DIAGNOSIS — I70219 Atherosclerosis of native arteries of extremities with intermittent claudication, unspecified extremity: Secondary | ICD-10-CM

## 2016-01-29 NOTE — Progress Notes (Signed)
Patient name: Cathy Jones MRN: [id number] DOB: 12/20/1960 Sex: female  REASON FOR VISIT: Follow-up femorofemoral bypass  HPI: Cathy Jones is a 55 y.o. female with a very complex past history. Fortunately since my last visit with her she has returned to the operating room for ileostomy closure and is quite pleased with this. 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 She presented with severe ischemia right lower extremity and underwent revascularization with femorofemoral bypass. She did have an episode of thrombosis of this after having CT directed drainage of a pelvic abscess. She was lying flat on her abdomen for prolonged period. Has had no further trouble since this  Past Medical History:  Diagnosis Date  . Acute renal failure (ARF) ([place]) 08/07/2015  . Anemia   . Asthma    pt brought her proair inhaler 06-06-14  . Cervical cancer ([place])   . Colon polyp    hyperplastic  . 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 Colonic obstruction ([place])   . Depression   . Family history of adverse reaction to anesthesia    pts mother experiences nausea and vomiting   . GERD (gastroesophageal reflux disease)   . History of bronchitis   . History of chemotherapy   . History of hiatal hernia   . History of radiation therapy   . Hyperlipidemia   . Hypertension   . 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 Migraine    "q couple years maybe" (08/07/2015)  . Shortness of breath dyspnea    seasonal     Family History  Problem Relation Age of Onset  . Rectal cancer Cousin   . Hypertension Mother   . Hyperparathyroidism Mother   . Hypertension Father   . CAD Father     CABG  . Peripheral vascular disease Father   . Graves' disease Father   . Colon cancer Neg Hx   . Stomach cancer Neg Hx   . Esophageal cancer Neg Hx   . Deep vein thrombosis Neg Hx     SOCIAL  HISTORY: Social History  Substance Use Topics  . Smoking status: Former Smoker    Packs/day: 1.50    Years: 30.00    Types: Cigarettes    Quit date: 05/09/2015  . Smokeless tobacco: Never Used  . Alcohol use No    Allergies  Allergen Reactions  . Codeine Other (See Comments)    "hyper"    Current Outpatient Prescriptions  Medication Sig Dispense Refill  . albuterol (PROVENTIL HFA;VENTOLIN HFA) 108 (90 Base) MCG/ACT inhaler Inhale 2 puffs into the lungs every 6 (six) hours as needed for wheezing or shortness of breath.    Marland Kitchen ibuprofen (ADVIL,MOTRIN) 200 MG tablet Take 600 mg by mouth every 6 (six) hours as needed for moderate pain.    Marland Kitchen loperamide (IMODIUM) 2 MG capsule Take 1-2 capsules (2-4 mg total) by mouth 3 (three) times daily as needed for diarrhea or loose stools. (Patient taking differently: 2-4 mg once. ) 30 capsule 0  . LORazepam (ATIVAN) 0.5 MG tablet Take 0.5 mg by mouth every morning.    . Probiotic Product (PROBIOTIC PO) Take 1 tablet by mouth daily.    . ranitidine (ZANTAC) 150 MG tablet Take 150 mg by mouth daily.    . sertraline (ZOLOFT) 100 MG tablet Take one tablet by mouth every morning.  0  .  vitamin B-12 1000 MCG tablet Take 0.5 tablets (500 mcg total) by mouth daily.    . ondansetron (ZOFRAN) 4 MG tablet Take 1 tablet (4 mg total) by mouth every 6 (six) hours as needed for nausea. (Patient not taking: Reported on 01/29/2016) 20 tablet 0  . oxyCODONE (OXY IR/ROXICODONE) 5 MG immediate release tablet Take 1-2 tablets (5-10 mg total) by mouth every 4 (four) hours as needed for moderate pain, severe pain or breakthrough pain. (Patient not taking: Reported on 01/29/2016) 30 tablet 0  . sodium chloride 0.9 % infusion Inject 1,000 mLs into the vein as needed (Pt directed, based on ostomy output). (Patient not taking: Reported on 01/29/2016) 1000 mL 0   No current facility-administered medications for this visit.     REVIEW OF SYSTEMS:  [X]  denotes positive finding, [ ]   denotes negative finding Cardiac  Comments:  Chest pain or chest pressure:    Shortness of breath upon exertion:    Short of breath when lying flat:    Irregular heart rhythm:        Vascular    Pain in calf, thigh, or hip brought on by ambulation:    Pain in feet at night that wakes you up from your sleep:     Blood clot in your veins:    Leg swelling:         Pulmonary    Oxygen at home:    Productive cough:     Wheezing:         Neurologic    Sudden weakness in arms or legs:     Sudden numbness in arms or legs:     Sudden onset of difficulty speaking or slurred speech:    Temporary loss of vision in one eye:     Problems with dizziness:         Gastrointestinal    Blood in stool:     Vomited blood:         Genitourinary    Burning when urinating:     Blood in urine:        Psychiatric    Major depression:         Hematologic    Bleeding problems:    Problems with blood clotting too easily:        Skin    Rashes or ulcers:        Constitutional    Fever or chills:      PHYSICAL EXAM: Vitals:   01/29/16 1354  BP: (!) 144/71  Pulse: 77  SpO2: 98%  Weight: 168 lb (76.2 kg)  Height: 5\' 2"  (1.575 m)    GENERAL: The patient is a well-nourished female, in no acute distress. The vital signs are documented above. VASCULAR: 2+ femoral pulses bilaterally.  ABDOMEN: Soft and non-tender with well-healed ileostomy closure wound  MUSCULOSKELETAL: There are no major deformities or cyanosis. NEUROLOGIC: No focal weakness or paresthesias are detected. SKIN: There are no ulcers or rashes noted. PSYCHIATRIC: The patient has a normal affect.  DATA:   Ankle arm index day was reviewed with patient. This is a 0.7 bilaterally  MEDICAL ISSUES:  Stable overall. Continue her walking program. Continues to have some numbness and tingling in her right foot. Explained that this will continue to improve up to one year following her surgery. We will see her again in 6 months  with repeat ankle arm index. She doesn't 5 me should she develop new ischemic symptoms    Marien Manship [facility] [employer] [phone]

## 2016-02-12 ENCOUNTER — Encounter: Payer: Self-pay | Admitting: Radiology

## 2016-02-19 ENCOUNTER — Other Ambulatory Visit: Payer: Self-pay | Admitting: Family Medicine

## 2016-02-19 DIAGNOSIS — Z1231 Encounter for screening mammogram for malignant neoplasm of breast: Secondary | ICD-10-CM

## 2016-03-12 ENCOUNTER — Ambulatory Visit
Admission: RE | Admit: 2016-03-12 | Discharge: 2016-03-12 | Disposition: A | Discharge status: Home / Self Care | Payer: BLUE CROSS/BLUE SHIELD | Source: Ambulatory Visit | Attending: Family Medicine | Admitting: Family Medicine

## 2016-03-12 DIAGNOSIS — Z1231 Encounter for screening mammogram for malignant neoplasm of breast: Secondary | ICD-10-CM

## 2016-04-17 NOTE — Addendum Note (Signed)
Addended by: Lianne Cure A on: 04/17/2016 09:00 AM   Modules accepted: Orders

## 2016-08-06 ENCOUNTER — Encounter: Payer: Self-pay | Admitting: Vascular Surgery

## 2016-08-19 ENCOUNTER — Encounter (HOSPITAL_COMMUNITY): Payer: BLUE CROSS/BLUE SHIELD

## 2016-08-19 ENCOUNTER — Ambulatory Visit: Payer: BLUE CROSS/BLUE SHIELD | Admitting: Vascular Surgery

## 2016-09-07 IMAGING — MG MM SCREEN MAMMOGRAM BILATERAL
6 series · 6 of 6 positions shown · non-contrast
Comparison: Previous exam(s).

ACR Breast Density Category a: The breast tissue is almost entirely
fatty.

CLINICAL DATA: Screening.

EXAM:
DIGITAL SCREENING BILATERAL MAMMOGRAM WITH CAD

[R CC]
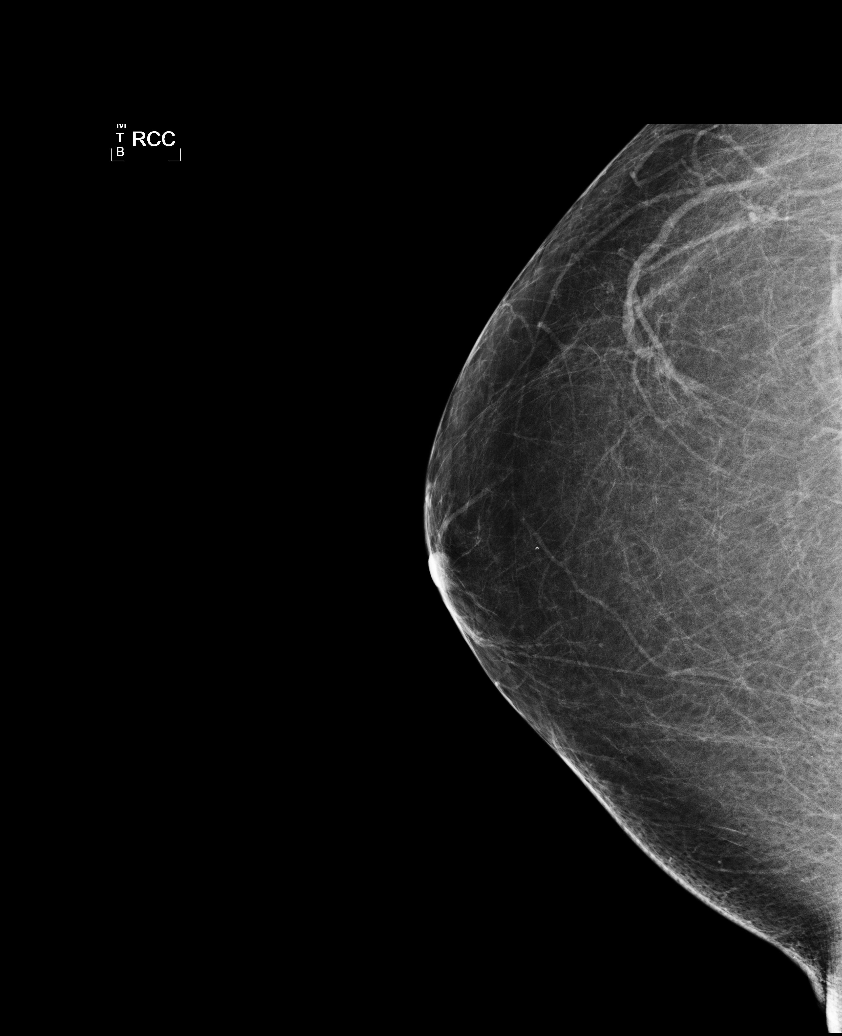

[L CC]
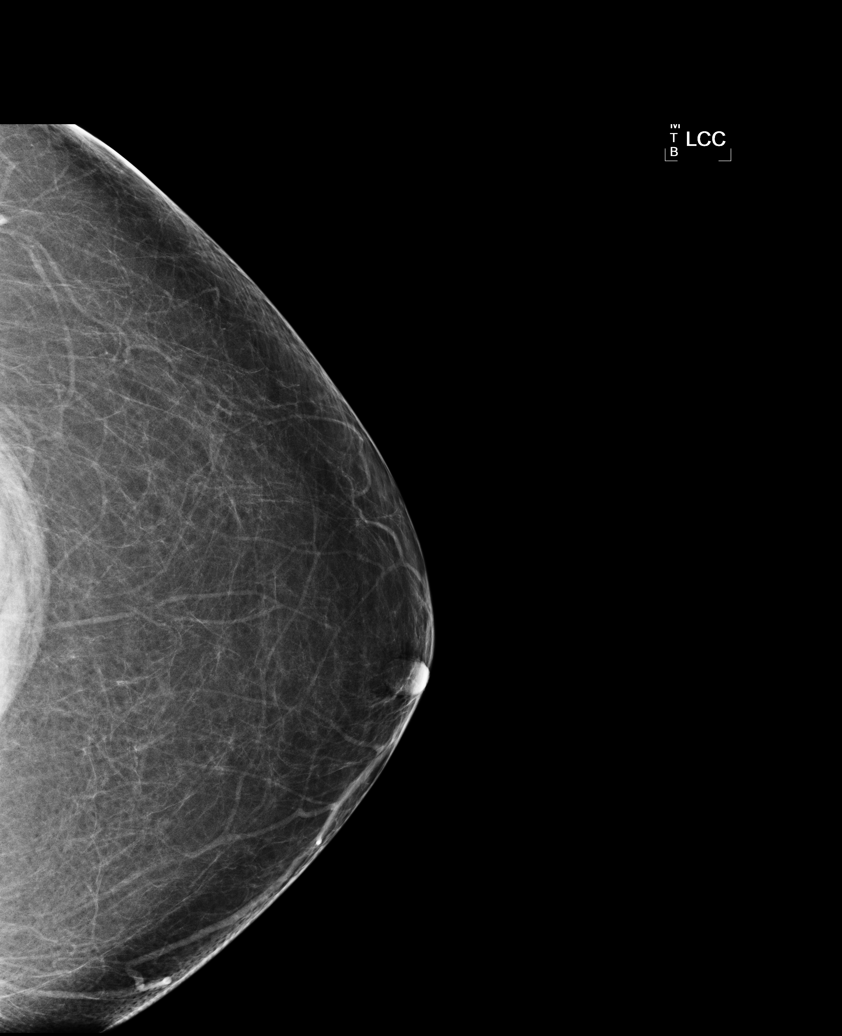

[L MLO]
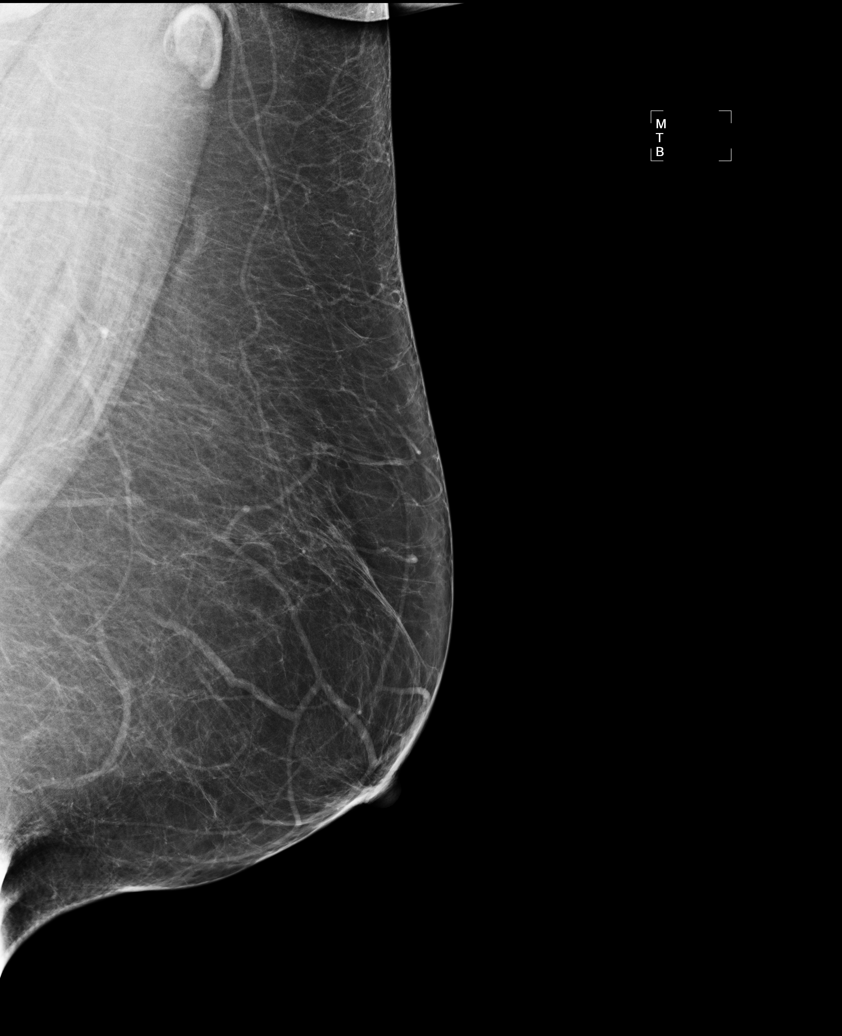

[R MLO]
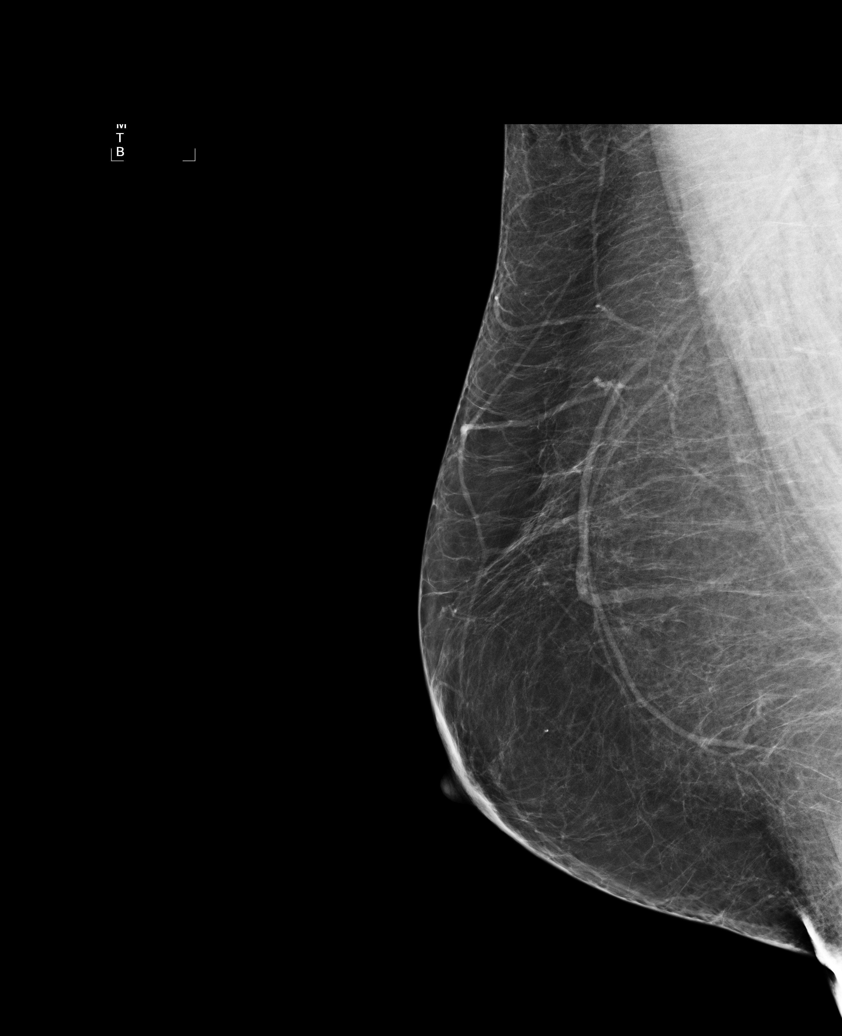

[R XCCL]
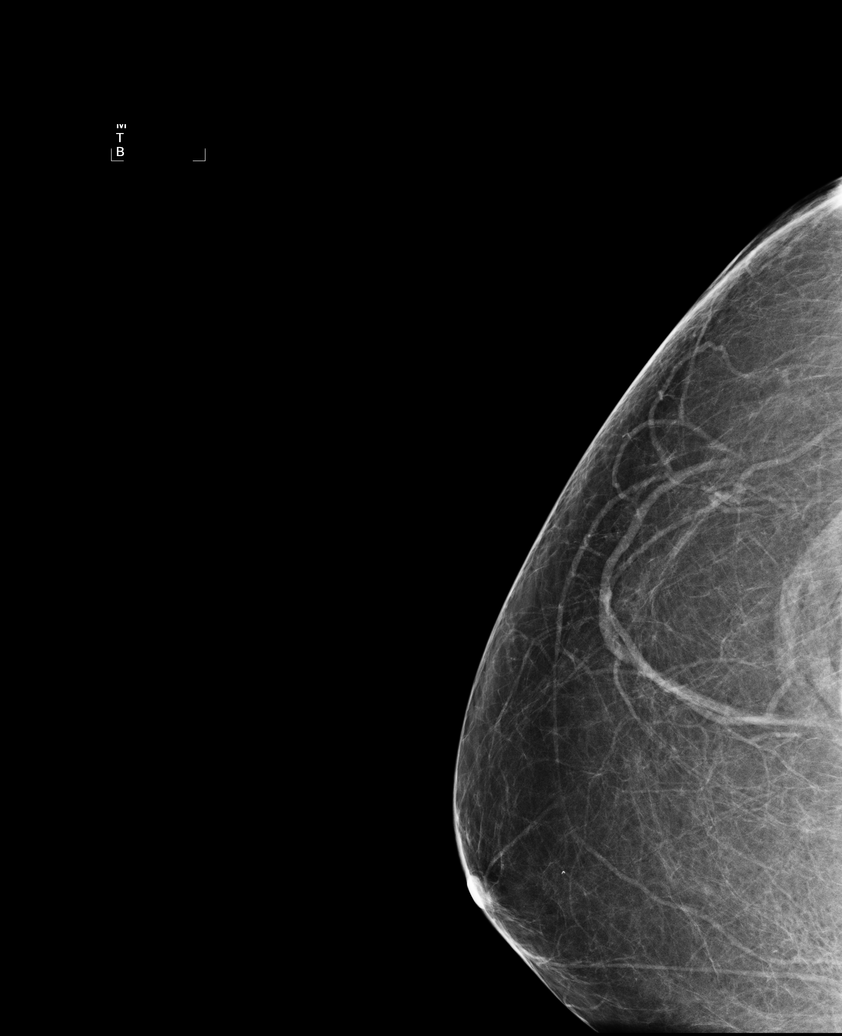

[L CV]
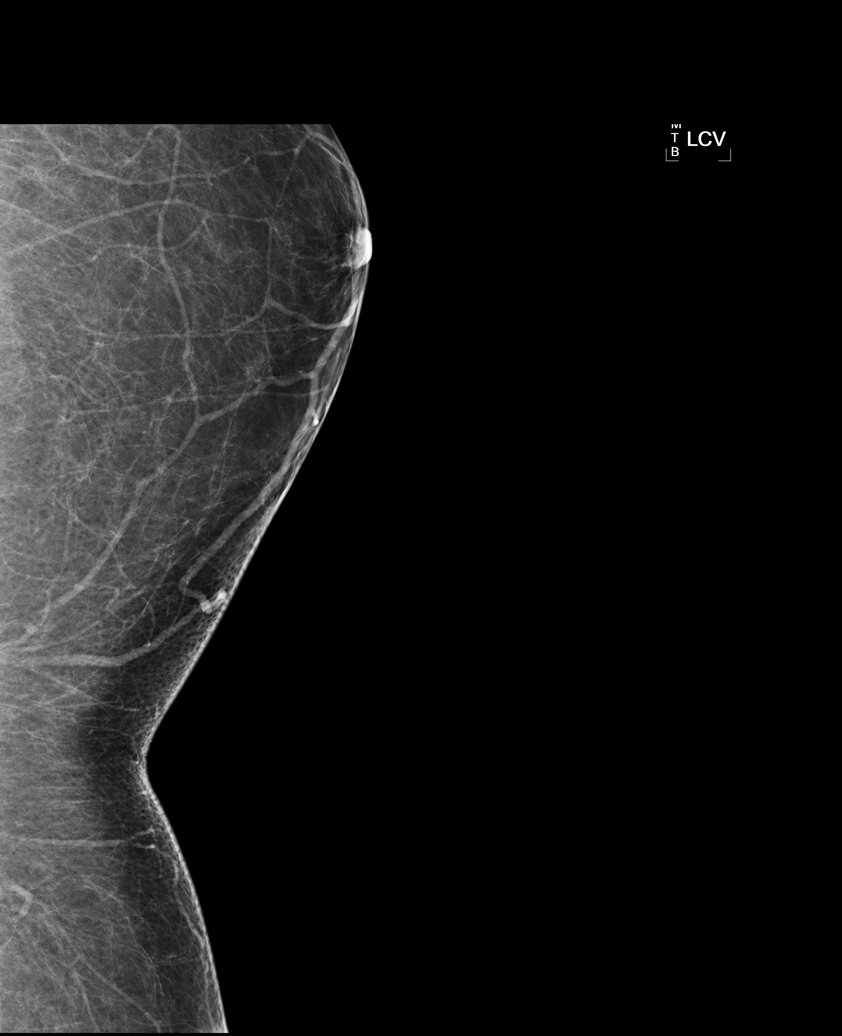

[6 of 6 positions shown; findings below may reference images not displayed]

FINDINGS: There are no findings suspicious for malignancy. Images were
processed with CAD.
IMPRESSION: No mammographic evidence of malignancy. A result letter of this
screening mammogram will be mailed directly to the patient.

RECOMMENDATION:
Screening mammogram in one year. (Code:[id number])

BI-RADS CATEGORY  1: Negative.

## 2016-09-23 ENCOUNTER — Encounter: Payer: Self-pay | Admitting: Vascular Surgery

## 2016-09-30 ENCOUNTER — Ambulatory Visit (HOSPITAL_COMMUNITY)
Admission: RE | Admit: 2016-09-30 | Discharge: 2016-09-30 | Disposition: A | Discharge status: Home / Self Care | Payer: BLUE CROSS/BLUE SHIELD | Source: Ambulatory Visit | Attending: Vascular Surgery | Admitting: Vascular Surgery

## 2016-09-30 ENCOUNTER — Encounter: Payer: Self-pay | Admitting: Vascular Surgery

## 2016-09-30 ENCOUNTER — Ambulatory Visit (INDEPENDENT_AMBULATORY_CARE_PROVIDER_SITE_OTHER): Payer: BLUE CROSS/BLUE SHIELD | Admitting: Vascular Surgery

## 2016-09-30 VITALS — BP 150/84 | HR 80 | Temp 98.0°F | Resp 16 | Ht 62.0 in | Wt 200.0 lb

## 2016-09-30 DIAGNOSIS — I70219 Atherosclerosis of native arteries of extremities with intermittent claudication, unspecified extremity: Secondary | ICD-10-CM

## 2016-09-30 DIAGNOSIS — I739 Peripheral vascular disease, unspecified: Secondary | ICD-10-CM | POA: Insufficient documentation

## 2016-09-30 DIAGNOSIS — R9439 Abnormal result of other cardiovascular function study: Secondary | ICD-10-CM | POA: Diagnosis not present

## 2016-09-30 NOTE — Progress Notes (Signed)
HISTORY AND PHYSICAL     CC:  Follow up Referring Provider:  Aletha Halim., PA-C  HPI: This is a 56 y.o. female with a very complex past history. Fortunately her ileostomy closure is working well and is quite pleased with this.  She states that she can not eat much red meat, but probiotic helps.    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 She presented with severe ischemia right lower extremity and underwent revascularization with femorofemoral bypass. She did have an episode of thrombosis of this after having CT directed drainage of a pelvic abscess. She was lying flat on her abdomen for prolonged period.  She states that she is doing well and getting around and doing what she wants.  She still has numbness in her right foot.  She states she has some swelling in her legs.  She states that if she has been up on her feet for a while and walks, she gets a pain around her right knee.      Past Medical History:  Diagnosis Date  . Acute renal failure (ARF) ([place]) 08/07/2015  . Anemia   . Asthma    pt brought her proair inhaler 06-06-14  . Cervical cancer ([place])   . Colon polyp    hyperplastic  . Colon stricture ([place])   . Colonic obstruction ([place])   . Depression   . Family history of adverse reaction to anesthesia    pts mother experiences nausea and vomiting   . GERD (gastroesophageal reflux disease)   . History of bronchitis   . History of chemotherapy   . History of hiatal hernia   . History of radiation therapy   . Hyperlipidemia   . Hypertension   . 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 Migraine    "q couple years maybe" (08/07/2015)  . Shortness of breath dyspnea    seasonal     Past Surgical History:  Procedure Laterality Date  . ABDOMINAL HYSTERECTOMY  1994  . COLON RESECTION N/A 06/27/2015   Procedure: LYSIS OF  ADHESIONS LOW ANTERIOR RESECTION DIVERTING LOOP ILEOSTOMY;  Surgeon: Leighton Ruff, MD;  Location: [facility];  Service: General;  Laterality: N/A;  . COLON SURGERY    . COLONOSCOPY    . CYSTOSCOPY WITH STENT PLACEMENT Bilateral 06/27/2015   Procedure: CYSTOSCOPY WITH CATHETER  PLACEMENT;  Surgeon: Franchot Gallo, MD;  Location: [facility];  Service: Urology;  Laterality: Bilateral;  . DIVERTING ILEOSTOMY N/A 06/27/2015   Procedure: DIVERTING LOOP ILEOSTOMY;  Surgeon: Leighton Ruff, MD;  Location: [facility];  Service: General;  Laterality: N/A;  . FEMORAL-FEMORAL BYPASS GRAFT Bilateral 08/10/2015   Procedure: BYPASS GRAFT LEFT FEMORALTO RIGHT FEMORAL ARTERY;  Surgeon: Rosetta Posner, MD;  Location: [place];  Service: Vascular;  Laterality: Bilateral;  . FLEXIBLE SIGMOIDOSCOPY  12/12/2015   Procedure: FLEXIBLE SIGMOIDOSCOPY;  Surgeon: Leighton Ruff, MD;  Location: [facility];  Service: General;;  . ILEOSTOMY CLOSURE N/A 12/12/2015   Procedure: OPEN ILEOSTOMY REVERSAL AND PELVIC WASHOUT;  Surgeon: Leighton Ruff, MD;  Location: [facility];  Service: General;  Laterality: N/A;  . INTRAOPERATIVE ARTERIOGRAM Right 08/10/2015   Procedure: INTRA OPERATIVE ARTERIOGRAM Right Iliac Artery;  Surgeon: Rosetta Posner, MD;  Location: [place];  Service: Vascular;  Laterality: Right;  . IR GENERIC HISTORICAL  09/12/2015   IR RADIOLOGIST EVAL & MGMT 09/12/2015 Olin Hauser  Nonie Hoyer, PA-C [facility] INTERV RAD  . IV antibiotic infusion therapy     . PERIPHERAL VASCULAR CATHETERIZATION N/A 08/09/2015   Procedure: Abdominal Aortogram w/Lower Extremity;  Surgeon: Conrad Atka, MD;  Location: [place] [facility];  Service: Cardiovascular;  Laterality: N/A;  . PERIPHERAL VASCULAR CATHETERIZATION Left 08/09/2015   Procedure: Peripheral Vascular Intervention;  Surgeon: Conrad Bliss, MD;  Location: [place] [facility];  Service: Cardiovascular;  Laterality: Left;  common iliac  . PICC LINE PLACE PERIPHERAL ([place] HX)    . THROMBECTOMY FEMORAL ARTERY Bilateral 09/05/2015    Procedure: THROMBECTOMY Left to Right Femoral to  FEMORAL ARTERY Bypass,.;  Surgeon: Mal Misty, MD;  Location: [place];  Service: Vascular;  Laterality: Bilateral;  . THROMBECTOMY ILIAC ARTERY Right 08/10/2015   Procedure: THROMBECTOMY Right ILIAC ARTERY;  Surgeon: Rosetta Posner, MD;  Location: [place];  Service: Vascular;  Laterality: Right;  . TUBAL LIGATION    . UPPER GASTROINTESTINAL ENDOSCOPY      Allergies  Allergen Reactions  . Codeine Other (See Comments)    "hyper"    Current Outpatient Prescriptions  Medication Sig Dispense Refill  . albuterol (PROVENTIL HFA;VENTOLIN HFA) 108 (90 Base) MCG/ACT inhaler Inhale 2 puffs into the lungs every 6 (six) hours as needed for wheezing or shortness of breath.    Marland Kitchen ibuprofen (ADVIL,MOTRIN) 200 MG tablet Take 600 mg by mouth every 6 (six) hours as needed for moderate pain.    Marland Kitchen LORazepam (ATIVAN) 0.5 MG tablet Take 0.5 mg by mouth every morning.    . Probiotic Product (PROBIOTIC PO) Take 1 tablet by mouth daily.    . ranitidine (ZANTAC) 150 MG tablet Take 150 mg by mouth daily.    . sertraline (ZOLOFT) 100 MG tablet Take one tablet by mouth every morning.  0  . vitamin B-12 1000 MCG tablet Take 0.5 tablets (500 mcg total) by mouth daily.    Marland Kitchen loperamide (IMODIUM) 2 MG capsule Take 1-2 capsules (2-4 mg total) by mouth 3 (three) times daily as needed for diarrhea or loose stools. (Patient not taking: Reported on 09/30/2016) 30 capsule 0  . ondansetron (ZOFRAN) 4 MG tablet Take 1 tablet (4 mg total) by mouth every 6 (six) hours as needed for nausea. (Patient not taking: Reported on 01/29/2016) 20 tablet 0  . oxyCODONE (OXY IR/ROXICODONE) 5 MG immediate release tablet Take 1-2 tablets (5-10 mg total) by mouth every 4 (four) hours as needed for moderate pain, severe pain or breakthrough pain. (Patient not taking: Reported on 01/29/2016) 30 tablet 0  . sodium chloride 0.9 % infusion Inject 1,000 mLs into the vein as needed (Pt directed, based on ostomy  output). (Patient not taking: Reported on 01/29/2016) 1000 mL 0   No current facility-administered medications for this visit.     Family History  Problem Relation Age of Onset  . Rectal cancer Cousin   . Hypertension Mother   . Hyperparathyroidism Mother   . Hypertension Father   . CAD Father        CABG  . Peripheral vascular disease Father   . Graves' disease Father   . Colon cancer Neg Hx   . Stomach cancer Neg Hx   . Esophageal cancer Neg Hx   . Deep vein thrombosis Neg Hx     Social History   Social History  . Marital status: Married    Spouse name: N/A  . Number of children: 1  . Years of education: N/A  Occupational History  . Not on file.   Social History Main Topics  . Smoking status: Former Smoker    Packs/day: 1.50    Years: 30.00    Types: Cigarettes    Quit date: 05/09/2015  . Smokeless tobacco: Never Used  . Alcohol use No  . Drug use: No  . Sexual activity: Yes   Other Topics Concern  . Not on file   Social History Narrative  . No narrative on file     REVIEW OF SYSTEMS:   [X]  denotes positive finding, [ ]  denotes negative finding Cardiac  Comments:  Chest pain or chest pressure:    Shortness of breath upon exertion:    Short of breath when lying flat:    Irregular heart rhythm:        Vascular    Pain in calf, thigh, or hip brought on by ambulation: x   Pain in feet at night that wakes you up from your sleep:  x   Blood clot in your veins:    Leg swelling:  x       Pulmonary    Oxygen at home:    Productive cough:     Wheezing:         Neurologic    Sudden weakness in arms or legs:     Sudden numbness in arms or legs:     Sudden onset of difficulty speaking or slurred speech:    Temporary loss of vision in one eye:     Problems with dizziness:         Gastrointestinal    Blood in stool:     Vomited blood:         Genitourinary    Burning when urinating:     Blood in urine:        Psychiatric    Major depression:          Hematologic    Bleeding problems:    Problems with blood clotting too easily:        Skin    Rashes or ulcers:        Constitutional    Fever or chills:      PHYSICAL EXAMINATION:  Vitals:   09/30/16 1543 09/30/16 1544  BP: (!) 150/86 (!) 150/84  Pulse: 80   Resp: 16   Temp: 98 F (36.7 C)    Body mass index is 36.58 kg/m.  General:  WDWN in NAD; vital signs documented above Gait: Not observed HENT: WNL, normocephalic Pulmonary: normal non-labored breathing , without Rales, rhonchi,  wheezing Cardiac: regular HR Abdomen: soft, NT, no masses; well healed midline scar Skin: without rashes Vascular Exam/Pulses:  Right Left  Femoral 2+ (normal) 2+ (normal)  Popliteal Unable to palpate  Unable to palpate   DP 2+ (normal) 2+ (normal)  PT 1+ (weak) 1+ (weak)   Extremities: without ischemic changes, without Gangrene , without cellulitis; without open wounds;  Musculoskeletal: no muscle wasting or atrophy  Neurologic: A&O X 3;  No focal weakness or paresthesias are detected Psychiatric:  The pt has Normal affect.   Non-Invasive Vascular Imaging:   ABI's on 09/30/16: Right:  0.69 -PT (B) -DP (M) Left:  0.75 -PT (B) -DP (M)   Previous ABI's on 01/29/16: Right:  0.69 Left:  0.68  Pt meds includes: Statin:  No. Beta Blocker:  No. Aspirin:  No. ACEI:  No. ARB:  No. CCB use:  No Other Antiplatelet/Anticoagulant:  No   ASSESSMENT/PLAN:: 56 y.o.  female s/p thrombectomy of left to right femoral to femoral bypass via right inguinal approach on 09/05/15 & Right iliac thrombectomy, right iliac arteriogram, left to right femorofemoral bypass with 8 mm Hemashield graft by Dr. Donnetta Hutching on 08/10/15   -pt is doing quite well.  Her ABI's remain stable and she has palpable DP pulses bilaterally.  She does continue to have numbness in her right foot, which will most likely be her new baseline at this point.  -We will see her back in one year with ABI's.  She will call  sooner if she begins to have new claudication sx or non healing wounds.    Leontine Locket, PA-C [facility] [phone]  Clinic MD:  Pt seen and examined with Dr. Donnetta Hutching  I have examined the patient, reviewed and agree with above.  Curt Jews, MD 09/30/2016 4:16 PM

## 2016-10-01 NOTE — Addendum Note (Signed)
Addended by: Lianne Cure A on: 10/01/2016 03:15 PM   Modules accepted: Orders

## 2017-01-25 IMAGING — CT CT ABD-PELV W/ CM
3 of 6 series · 11 of 36 positions shown, 17 images · IV contrast (READICAT/WATER & [ID] ISOVUE 300)
Comparison: CT virtual colonoscopy 07/19/2014. CT Abdomen and
Pelvis 06/13/2014.

CLINICAL DATA: 54-year-old female with nausea and vomiting for 1
week. Unintentional 50 lbs. weight loss. Mid abdominal pain. History
of History of cervical cancer status post hysterectomy. More recent
history of short-segment sigmoid colon stricture treated surgically.

EXAM:
CT ABDOMEN AND PELVIS WITH CONTRAST
TECHNIQUE: Multidetector CT imaging of the abdomen and pelvis was performed
using the standard protocol following bolus administration of
intravenous contrast.
CONTRAST:  100mL [id number] IOPAMIDOL ([id number]) INJECTION 61%

[Series 3: abd/pelvis with · axial · 0.83mm/px · z∈[-326,-42]mm · 6 of 81 slices shown, 11 images]
[im 12/81  soft-tissue]
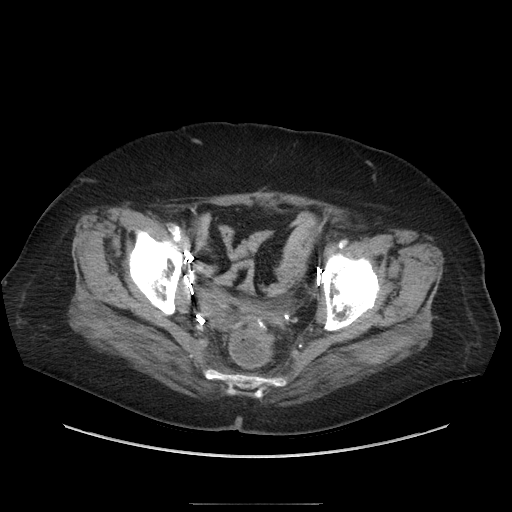
[im 12/81  bone]
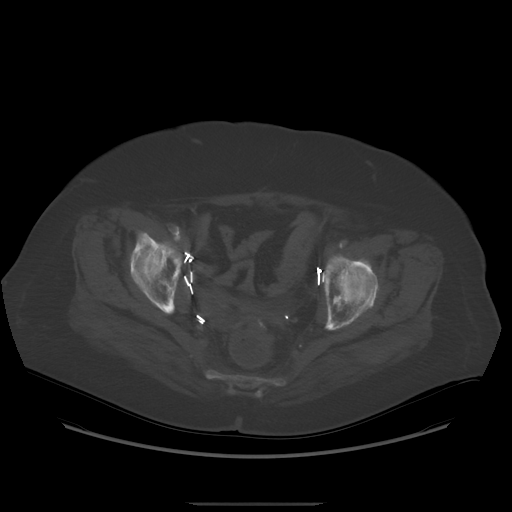
[im 23/81  soft-tissue]
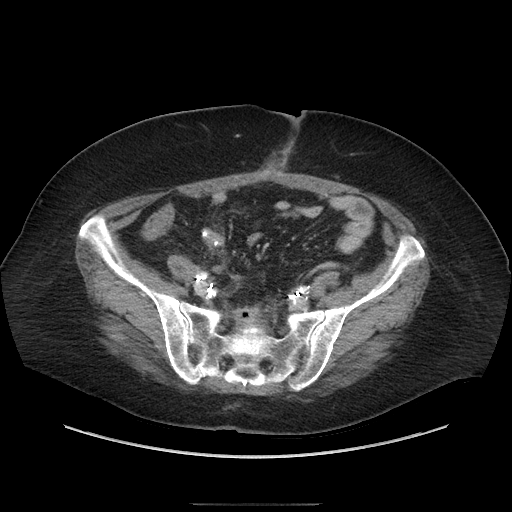
[im 35/81  soft-tissue]
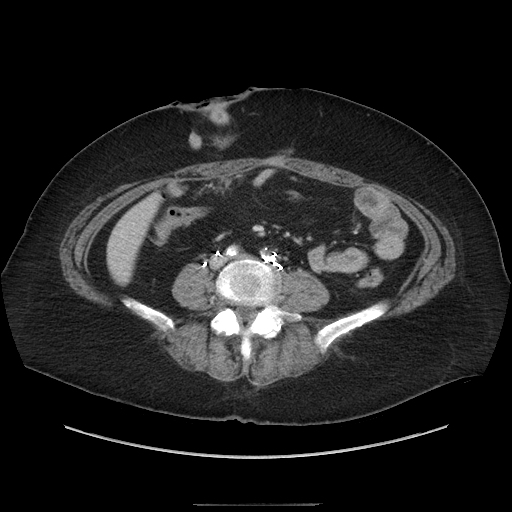
[im 35/81  lung]
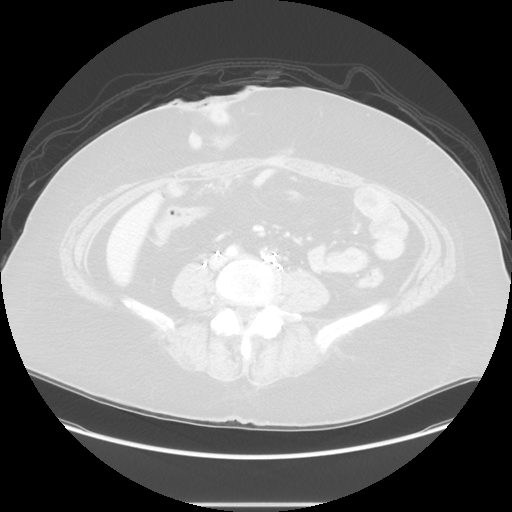
[im 46/81  soft-tissue]
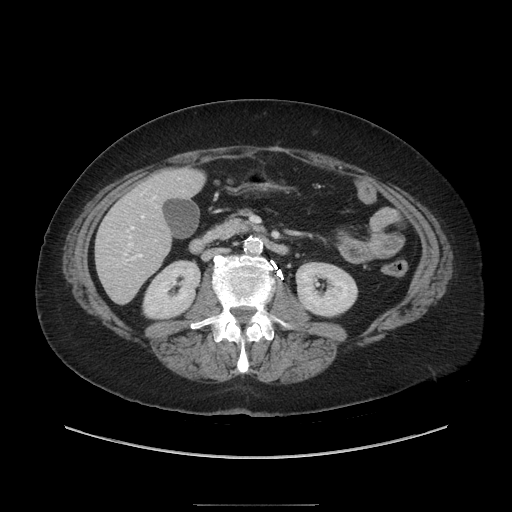
[im 46/81  lung]
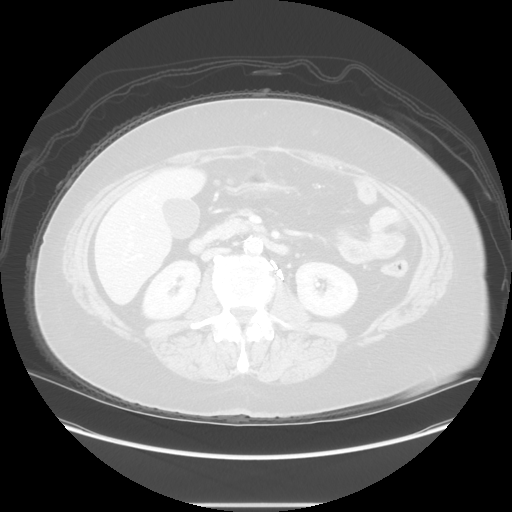
[im 58/81  soft-tissue]
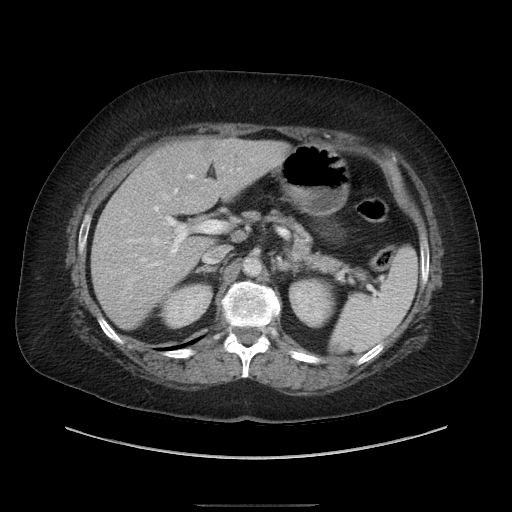
[im 58/81  lung]
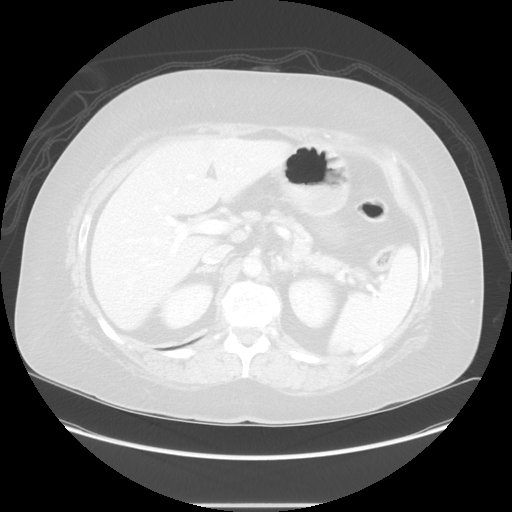
[im 69/81  soft-tissue]
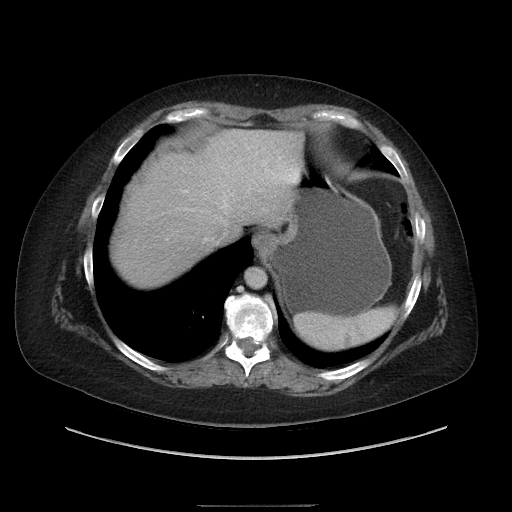
[im 69/81  lung]
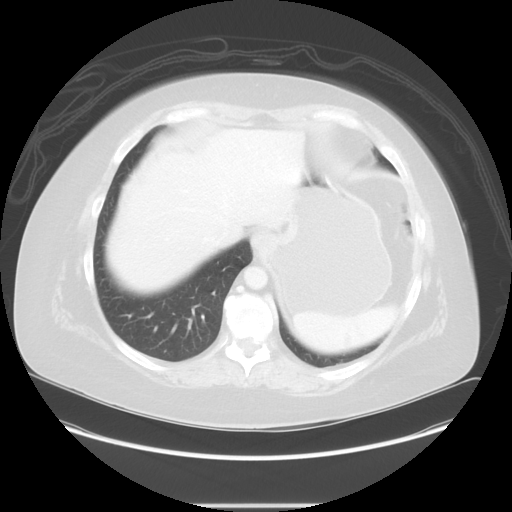

[Series 601: coronal body · coronal · 0.89mm/px · 1 of 129 slices shown, 2 images]
[im 43/129  soft-tissue]
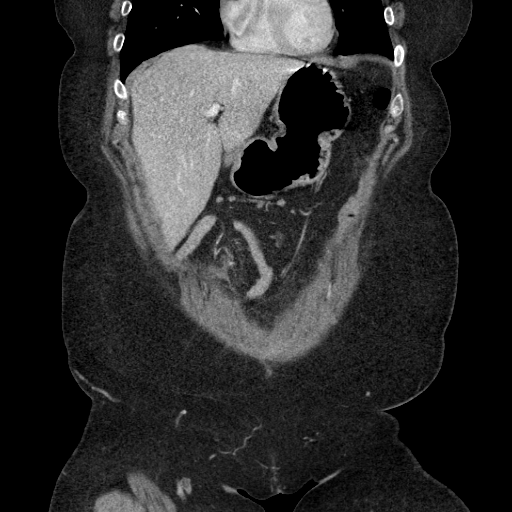
[im 43/129  bone]
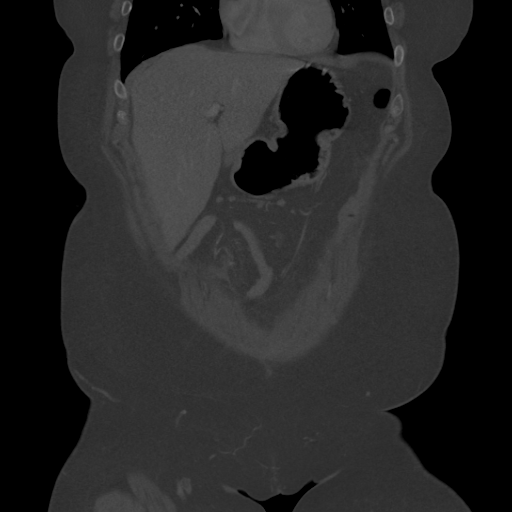

[Series 602: sagittal body · sagittal · 0.89mm/px · 4 of 172 slices shown]
[im 11/172  soft-tissue]
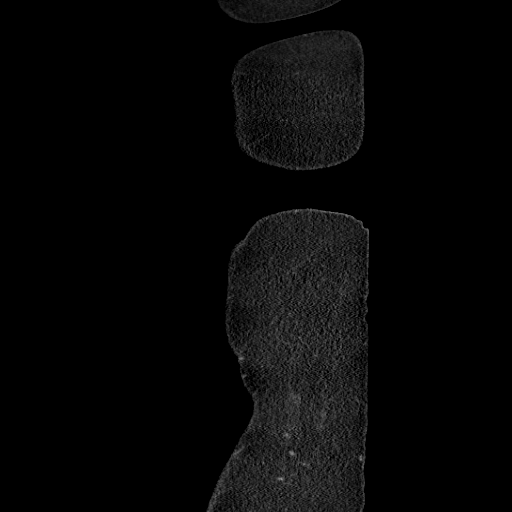
[im 33/172  soft-tissue]
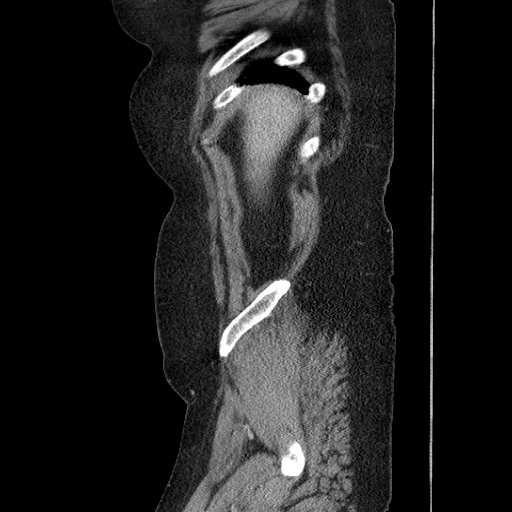
[im 54/172  soft-tissue]
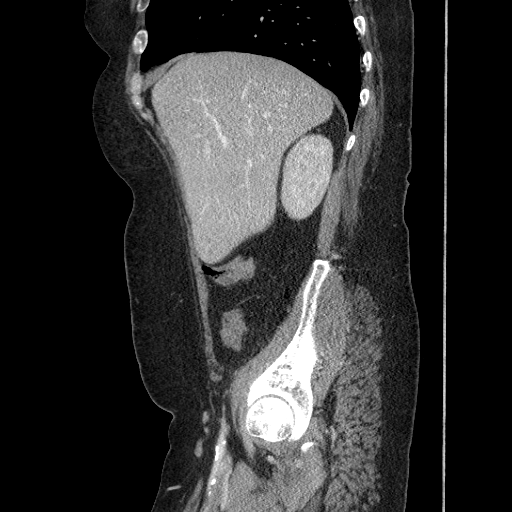
[im 75/172  soft-tissue]
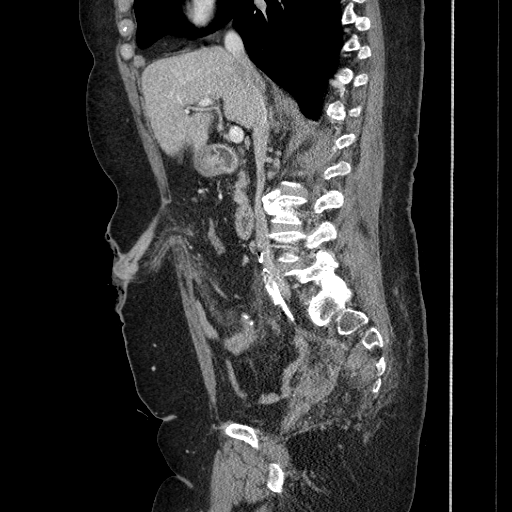

[11 of 36 positions shown; findings below may reference images not displayed]

FINDINGS: Lung bases are stable since 4745 and negative. No pericardial or
pleural effusion. Stable mild elevation of the left hemidiaphragm.

Stable visualized osseous structures. L3 limbus vertebra. Chronic
multilevel vertebral endplate irregularity most pronounced at L4. No
acute or suspicious osseous lesion identified.

Since July 2014 the patient has undergone ventral abdominal surgery
with right abdominal ileostomy. There is new confluent presacral
stranding. Chronic retroperitoneal and pelvic sidewall dissection
with numerous surgical clips otherwise appears stable. Evidence of a
sigmoid colectomy with staple line at what appears to be a sigmoid
to rectum anastomosis (series 3, image 69).

Along the right pelvic sidewall lateral to the apparent anastomosis
there is an 18 x 42 mm fluid collection or less likely residual
fluid-filled bowel loop (series 3, image 68). The uterus and adnexa
are chronically surgically absent. There is a small volume of gas
within the vagina. The urinary bladder is completely decompressed
today.

The left colon is decompressed. The transverse colon is
decompressed. The right colon is decompressed. The distal small
bowel is decompressed. The ileostomy has no adverse features there.
No dilated small bowel loops.

Is mild fluid distension of the stomach no pneumoperitoneum or
abdominal free fluid. Liver, gallbladder, pancreas and adrenal
glands are within normal limits. Portal venous system is patent. The
spleen is remarkable for a mildly lobulated but wedge-shaped and
peripheral hypodense area along the posterior margin which may be
new since 4745. No surrounding inflammation. More subtle peripheral
wedge-shaped hypodense area also on image 19 along the anterior
contour, and image 12 at the superior pole of the spleen. No left
upper quadrant fluid.

Extensive Aortoiliac calcified atherosclerosis noted. Major arterial
structures appear to remain patent. However, there is newly seen
low-density bulky mural plaque or thrombus along the posterior wall
of the descending thoracic aorta measuring 6 x 11 x 10 mm (AP by
transverse by CC), new since 4745.

Stable residual small retroperitoneal lymph nodes.

Bilateral renal enhancement is symmetric and within normal limits.
However, despite up to 7 minute delayed imaging through the kidneys
no renal contrast excretion occurred. However, at the same time
there is no hydronephrosis or hydroureter. Both proximal ureters are
diminutive. Both track into the postoperative retroperitoneal
changes an are poorly delineated distally. There is no perinephric
stranding.

The interval postoperative changes to the ventral abdominal wall are
noted with no adverse features.
IMPRESSION: 1. Evidence of multiple small splenic infarcts. Associated new soft
mural plaque or thrombus in the descending thoracic aorta (series 3,
image 3) suspicious for embolic source. Recommend vascular surgery
consultation.
2. Interval postoperative changes to the pelvis, including apparent
partial sigmoidectomy and reanastomosis. There is an elongated
extraluminal fluid collection beginning posterior to the anastomosis
site (in the midline presacral space) and tracking along the
posterior right pelvic sidewall. This is of unclear etiology and
significance.
3. Suspect acute renal insufficiency on the basis of intrinsic renal
disease suspected in light of no contrast excretion to from the
kidneys up to 7 minutes following injection. No evidence of
obstructive uropathy.
4. New right abdominal ileostomy with no bowel obstruction.
The above was discussed by telephone with Dr. XILETH TUMADOTTIR on

## 2017-02-05 IMAGING — RF DG C-ARM 61-120 MIN
1 series · 4 of 4 positions shown · non-contrast
Comparison: CT abdomen/ pelvis 07/30/2015

CLINICAL DATA: 54-year-old female undergoing intraoperative
arteriogram

EXAM:
DG C-ARM 61-120 MIN; ABDOMEN - 2 VIEW

[Series 1: run · 2 acquisitions, 4 frames shown]
[im 1/2]
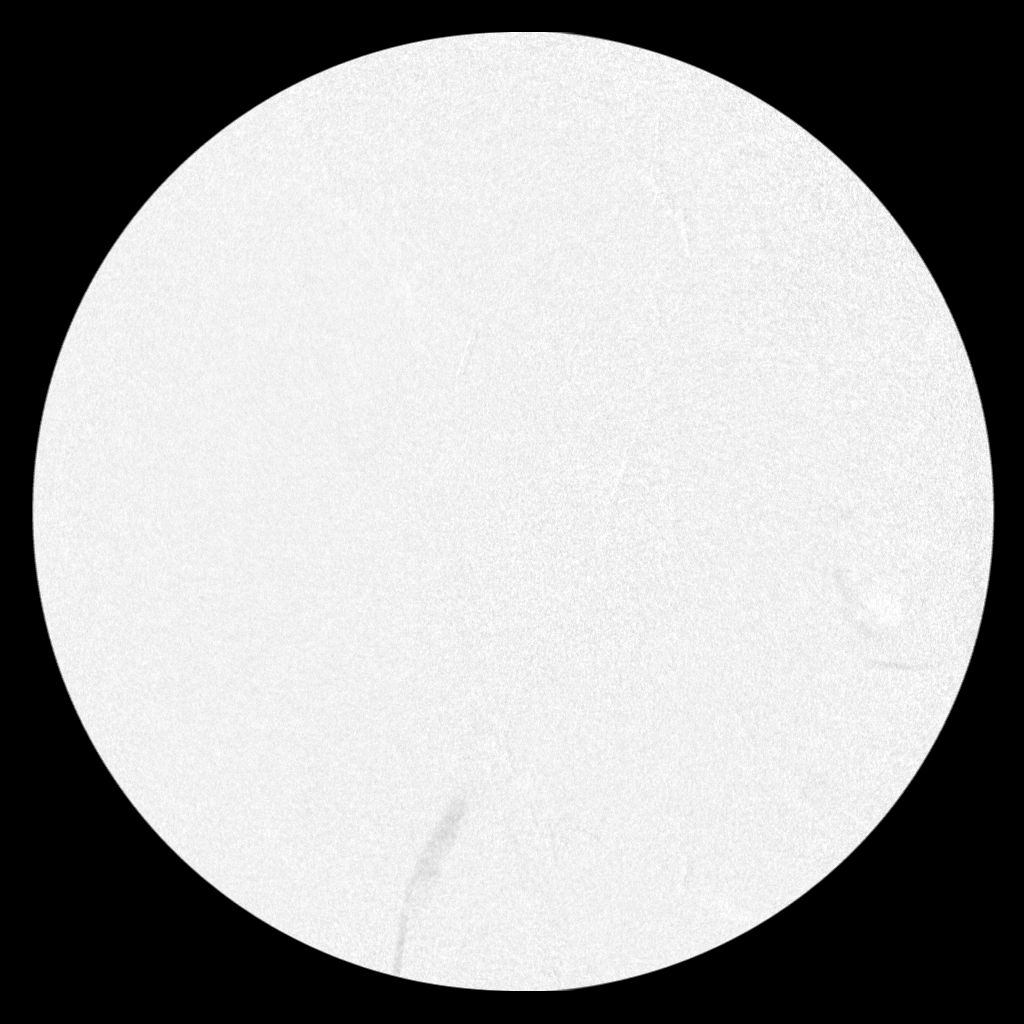
[im 1/2]
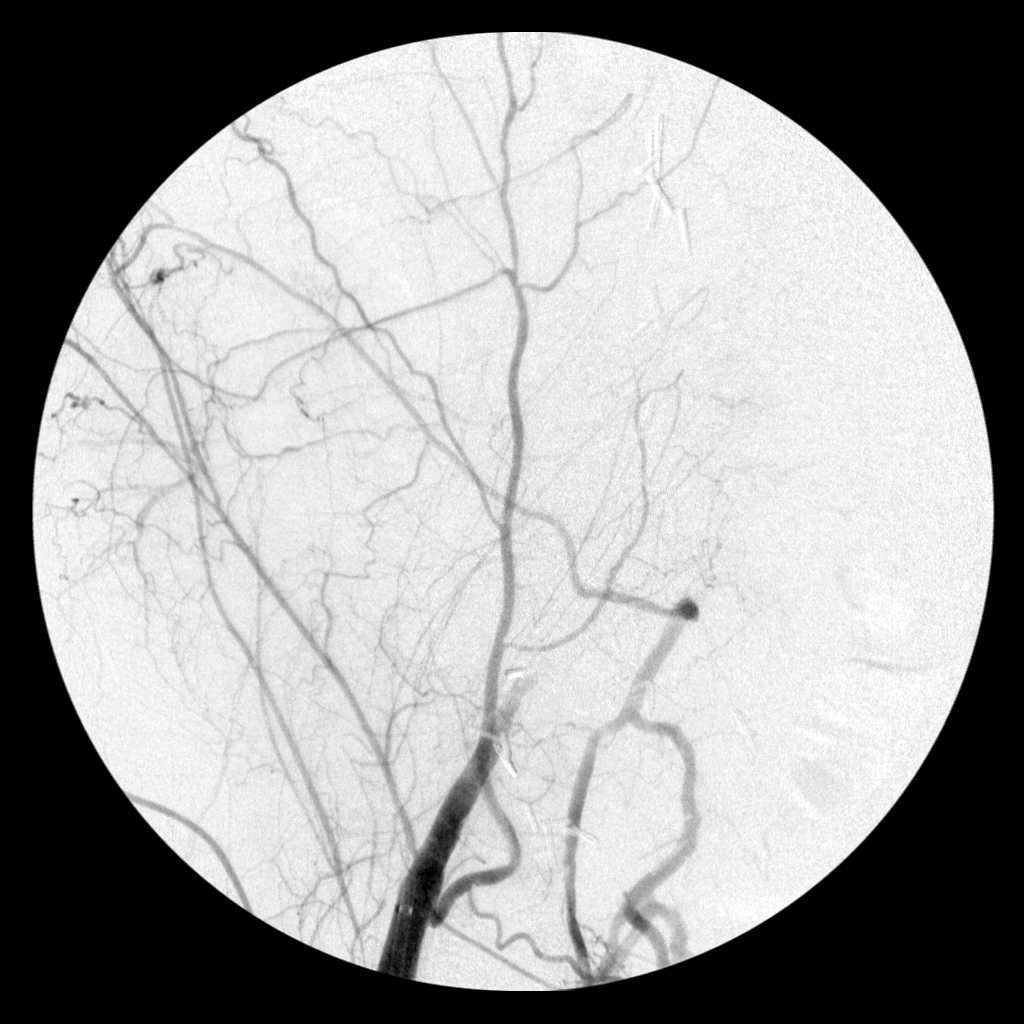
[im 1/2]
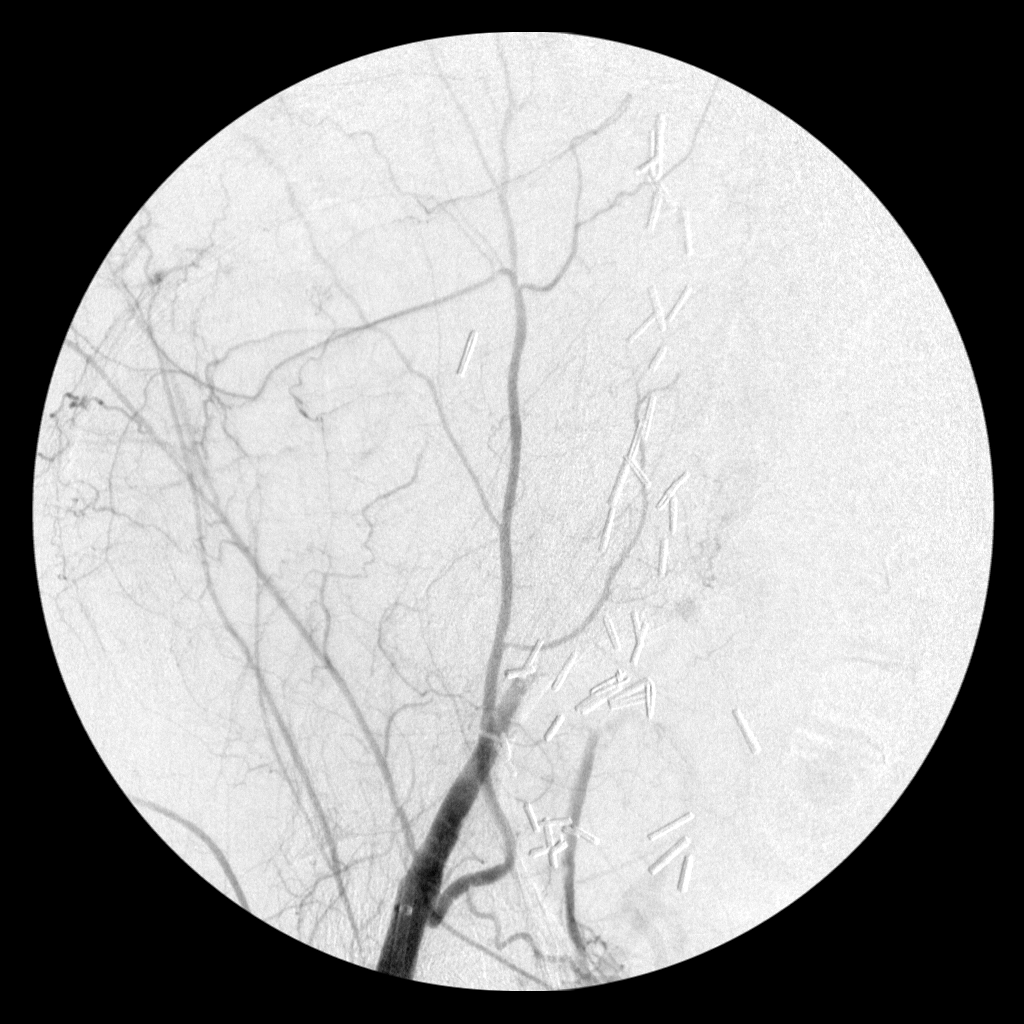
[im 2/2]
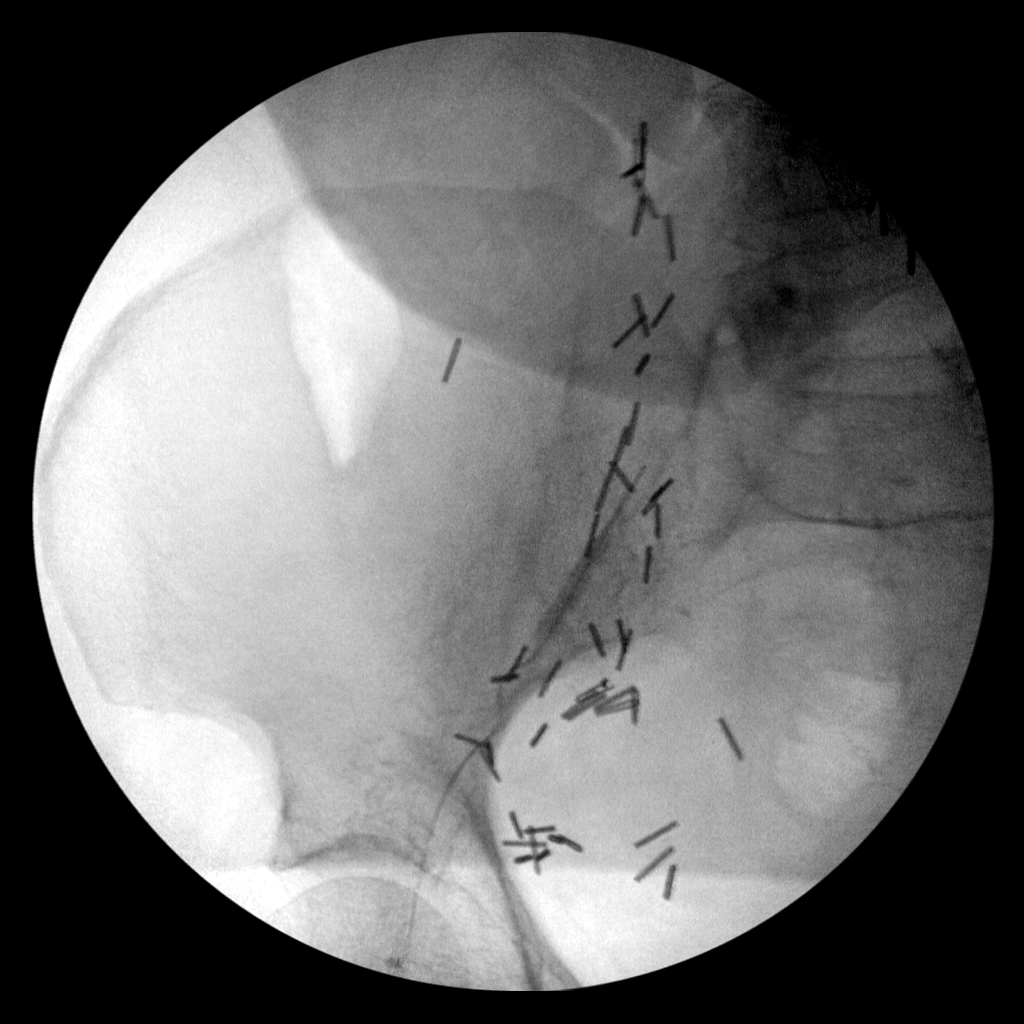

[4 of 4 positions shown; findings below may reference images not displayed]

FINDINGS: A cine loop demonstrates a vascular sheath in the common femoral
artery. Contrast injection demonstrates a patent common femoral
artery. The external iliac artery is occluded in the mid pelvis.
Filling of internal iliac artery branches is noted via deep
circumflex iliac collaterals. The inferior epigastric artery is
widely patent. Surgical clips along the right pelvic sidewall
suggest prior nodal dissection. The second image demonstrates a wire
along the expected path of the external iliac artery.
IMPRESSION: Intraoperative arteriogram as above. There appears to be occlusion
of the mid right external iliac artery.

## 2017-02-09 IMAGING — US US RENAL
1 series · 14 of 25 positions shown · non-contrast
Comparison: None.

CLINICAL DATA: Acute renal failure.  Hypertension.

EXAM:
RENAL / URINARY TRACT ULTRASOUND COMPLETE

[Series 1: us renal · 0.28mm/px · 14 of 37 slices shown]
[im 1/37]
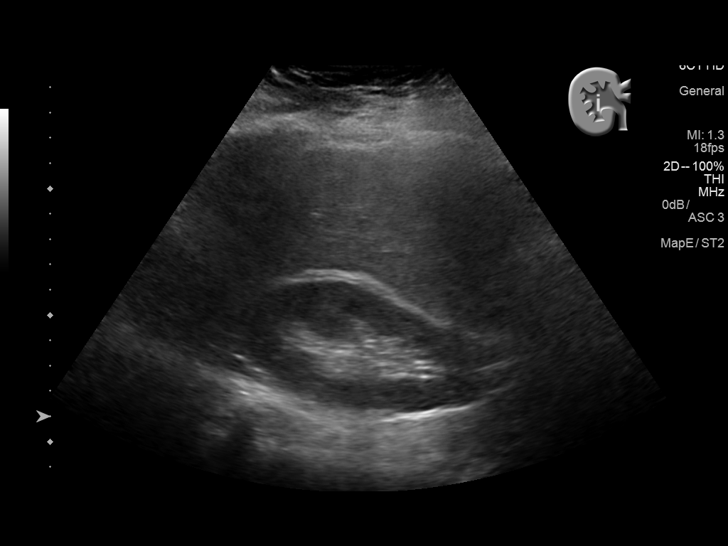
[im 4/37]
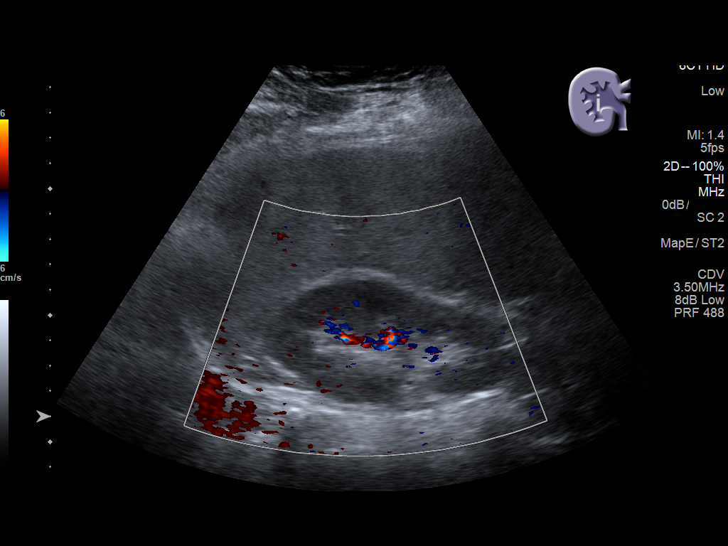
[im 7/37]
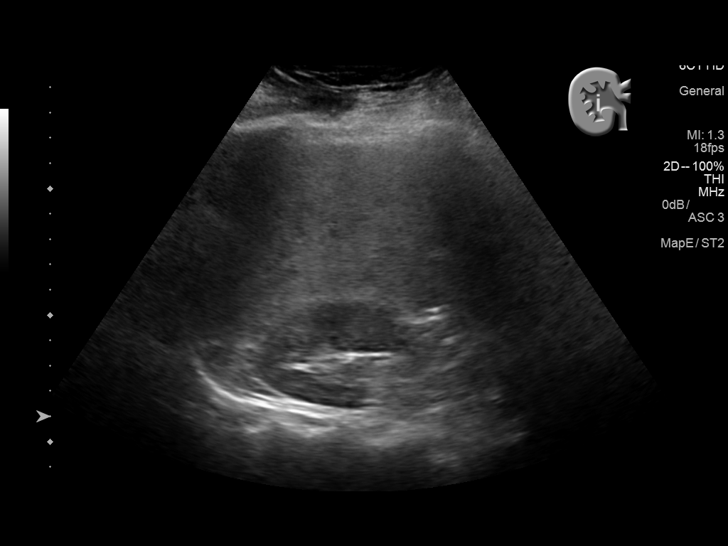
[im 10/37]
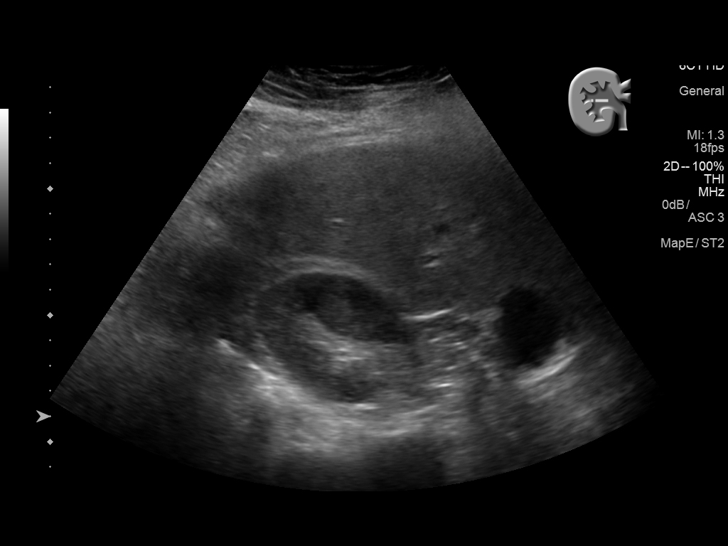
[im 13/37]
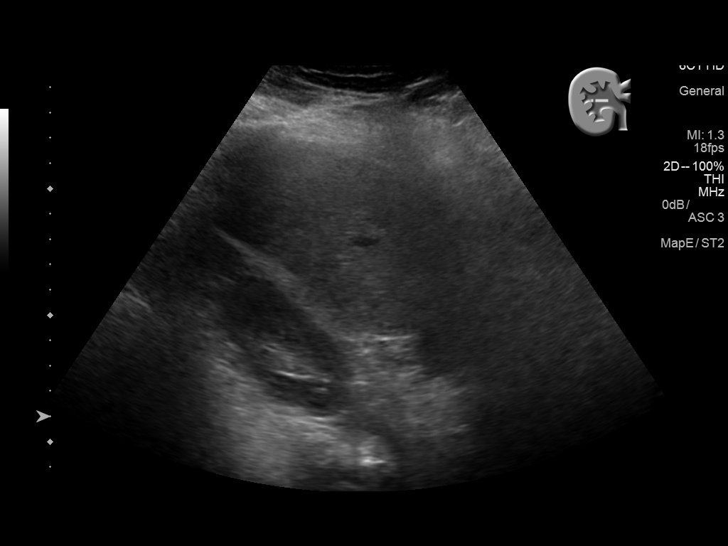
[im 14/37]
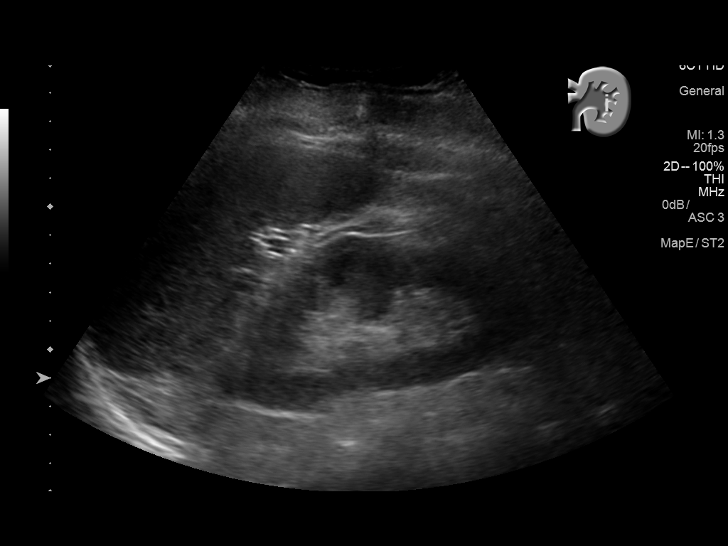
[im 17/37]
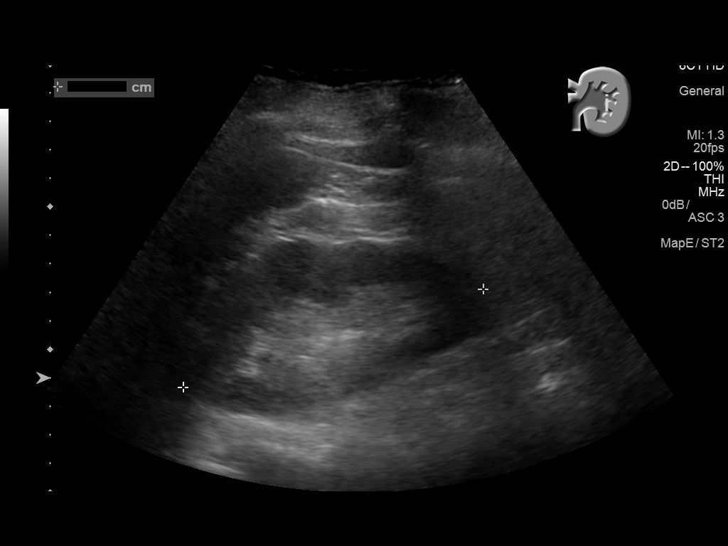
[im 20/37]
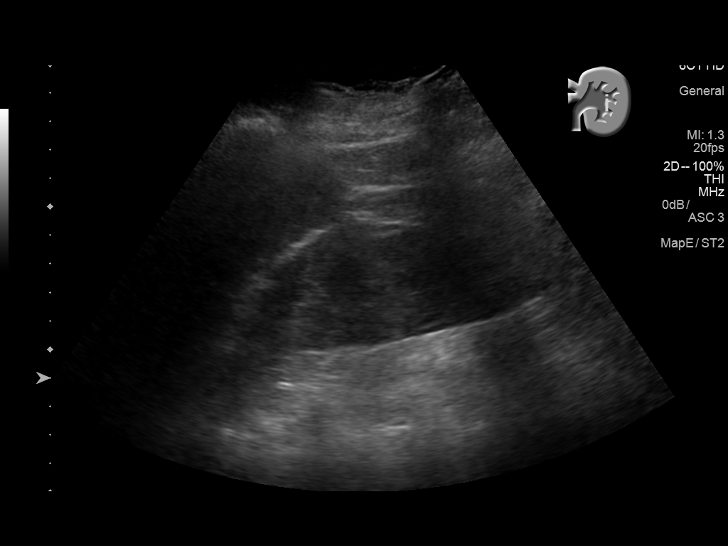
[im 23/37]
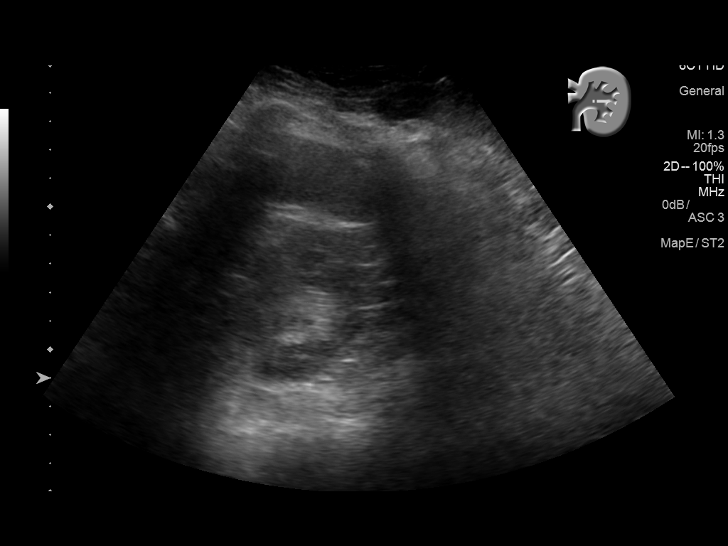
[im 25/37]
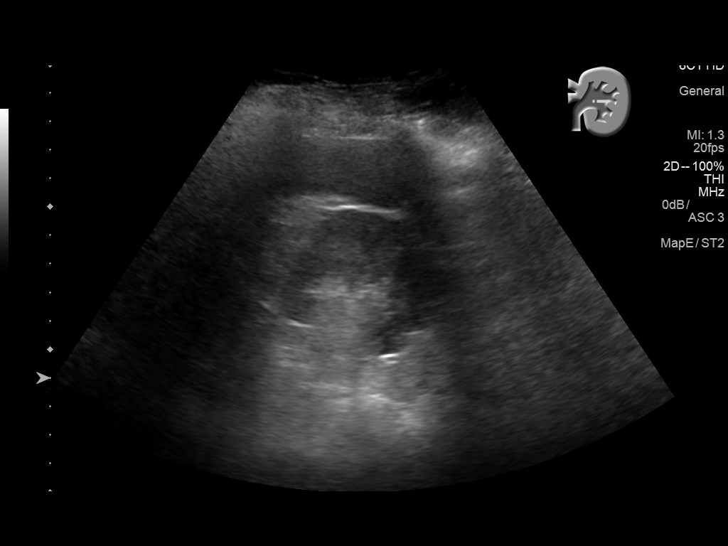
[im 28/37]
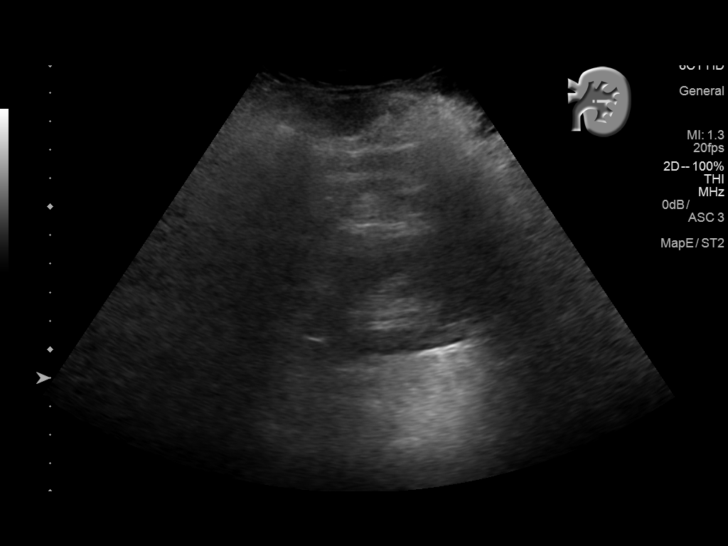
[im 31/37]
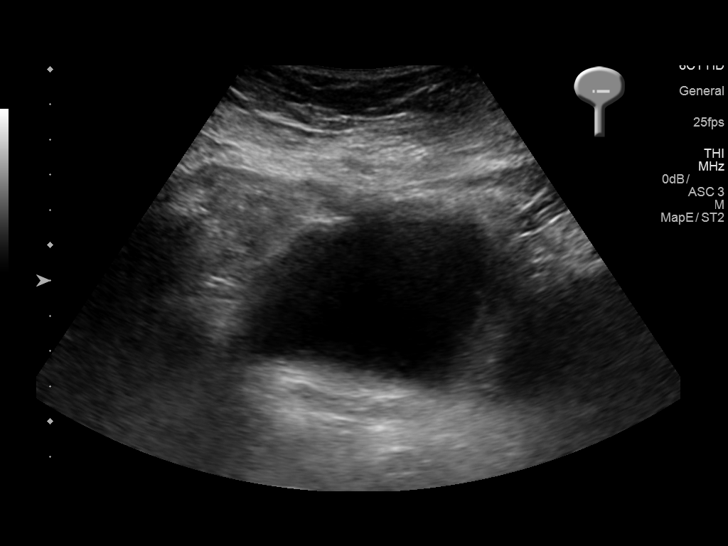
[im 34/37]
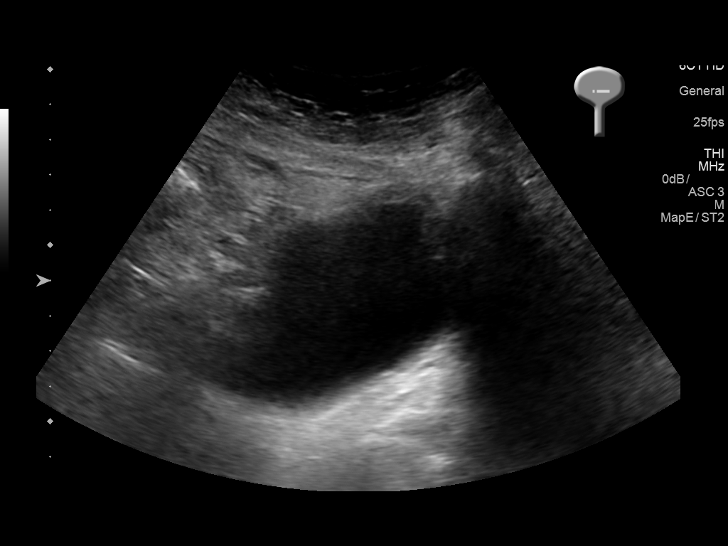
[im 37/37]
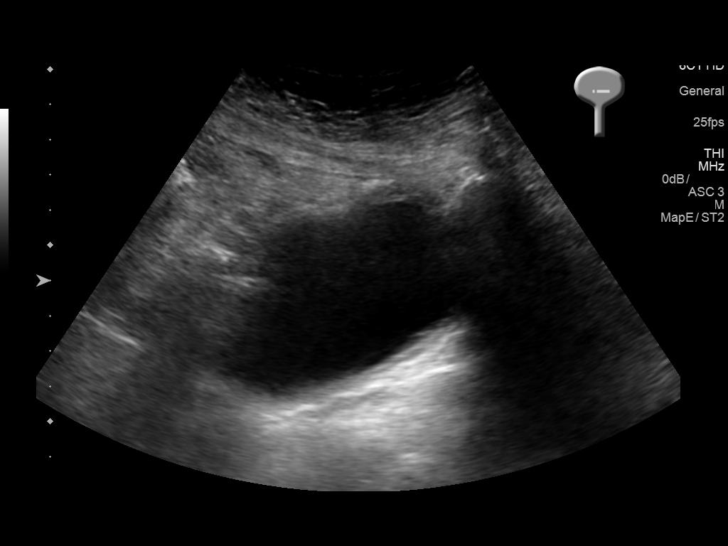

[14 of 25 positions shown; findings below may reference images not displayed]

FINDINGS: Right Kidney:

Length: 10.1 cm. Echogenicity within normal limits. No mass or
hydronephrosis visualized.

Left Kidney:

Length: 11.1 cm. Echogenicity within normal limits. No mass or
hydronephrosis visualized.

Bladder:

Appears normal for degree of bladder distention.
IMPRESSION: Normal renal ultrasound.  No hydronephrosis.

## 2017-02-18 IMAGING — DX DG CHEST 1V PORT
1 series · 1 of 1 positions shown · non-contrast
Comparison: Chest radiograph performed 10/13/2013

CLINICAL DATA: Acute onset of sepsis. Assess for source of
infection. Initial encounter.

EXAM:
PORTABLE CHEST 1 VIEW

[chest ap]
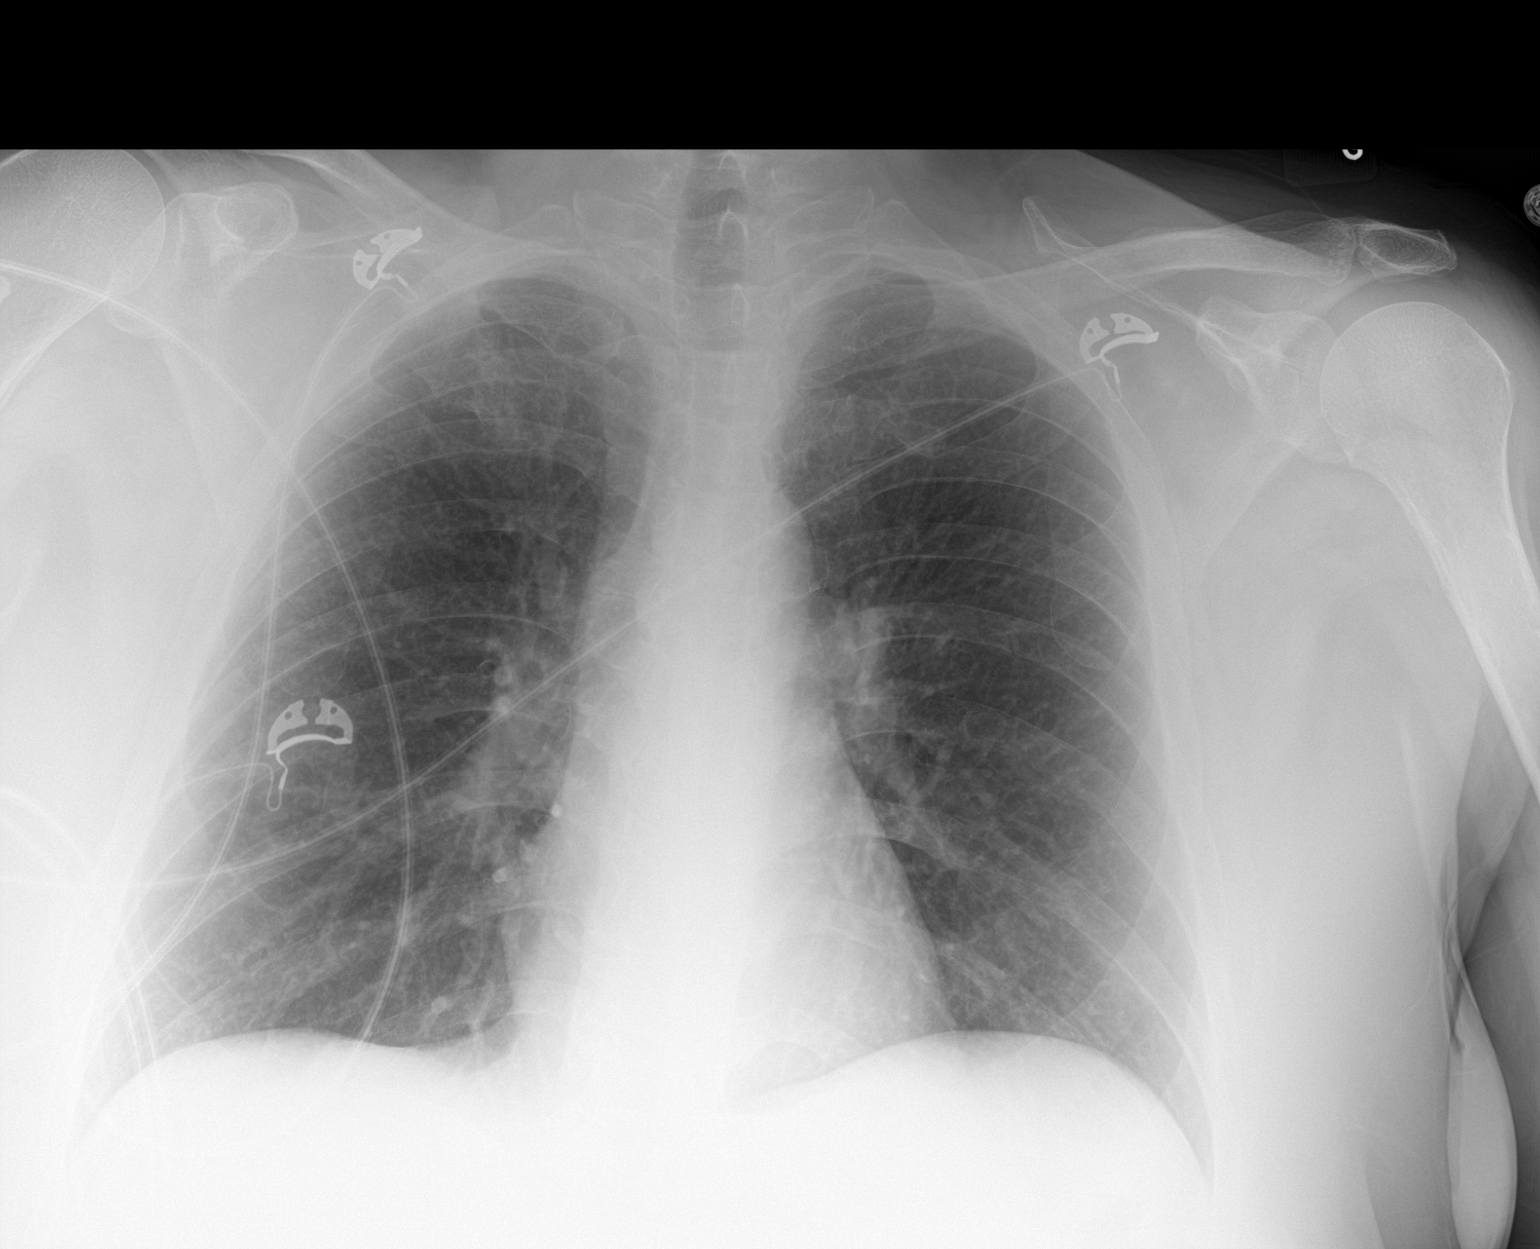

[1 of 1 positions shown; findings below may reference images not displayed]

FINDINGS: The lungs are well-aerated and clear. There is no evidence of focal
opacification, pleural effusion or pneumothorax.

The cardiomediastinal silhouette is within normal limits. No acute
osseous abnormalities are seen.
IMPRESSION: No acute cardiopulmonary process seen.

## 2017-03-01 IMAGING — RF DG COLON W/ WATER SOL CM
9 of 13 series · 12 of 24 positions shown · non-contrast
Comparison: No priors.

CLINICAL DATA: 54-year-old female with history of low anterior
resection and loop ileostomy. High output from the ileostomy.
Evaluate colorectal anastomosis for stricture or anastomotic leak.

EXAM:
COLON WITH WATER SOLUTION CONTRAST

[Series 2: t abdomen supine · 0.15mm/px · 1 of 1 slices shown]
[im 1/1]
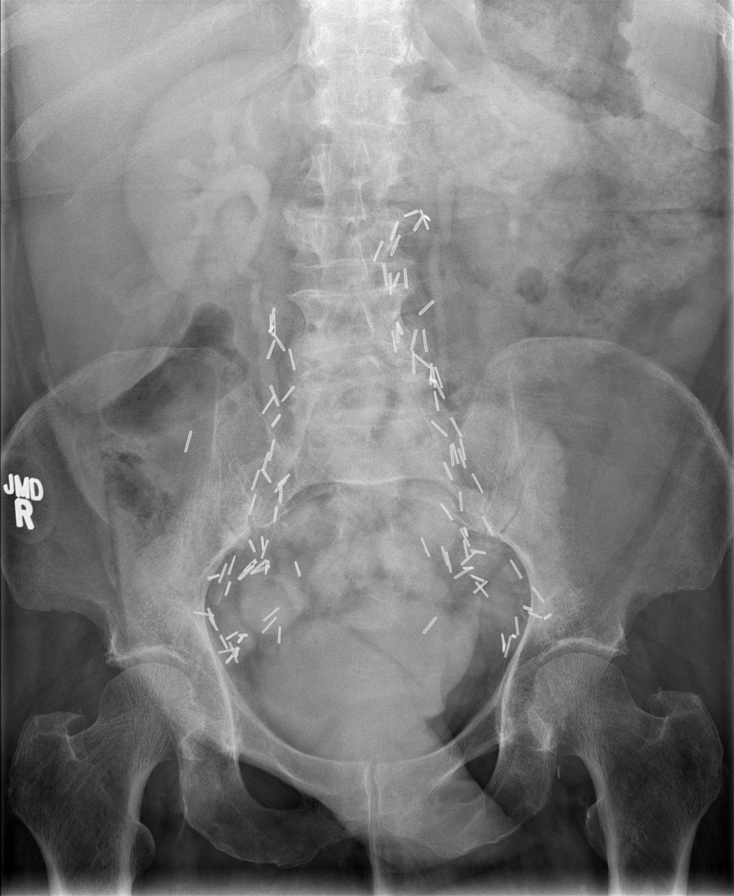

[Series 4: fluoro_barium singleshot_bw · 0.18mm/px · 1 of 1 slices shown (1 of 4)]
[im 1/1]
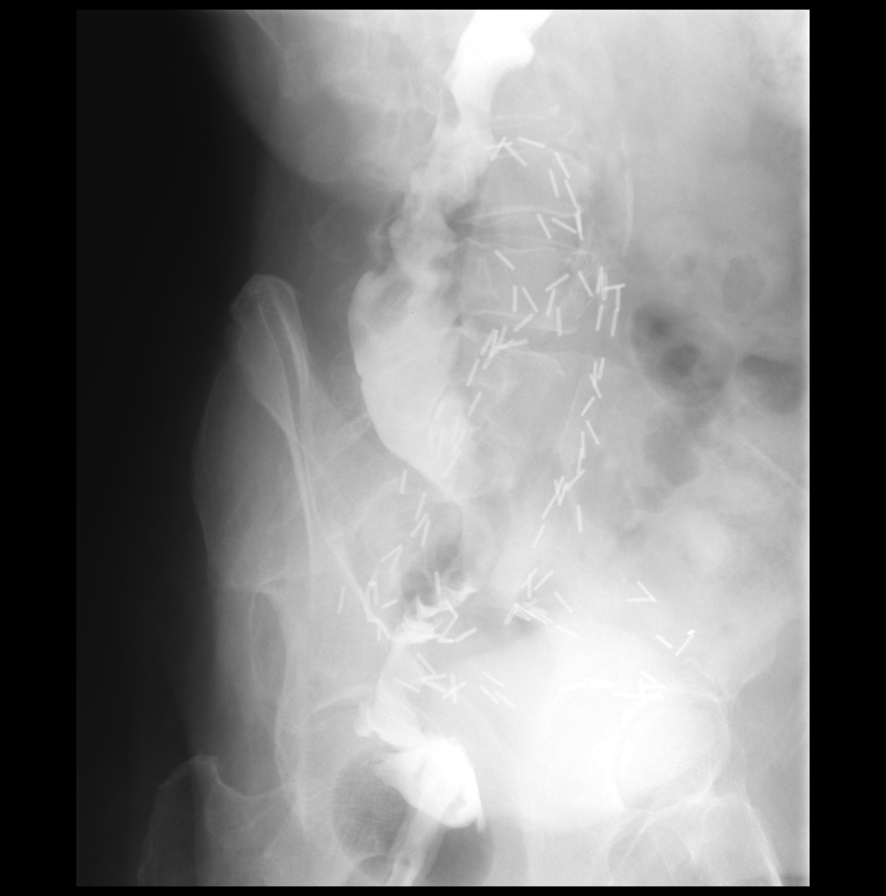

[Series 6: fluoro_barium singleshot_bw · 0.18mm/px · 1 of 1 slices shown (2 of 4)]
[im 1/1]
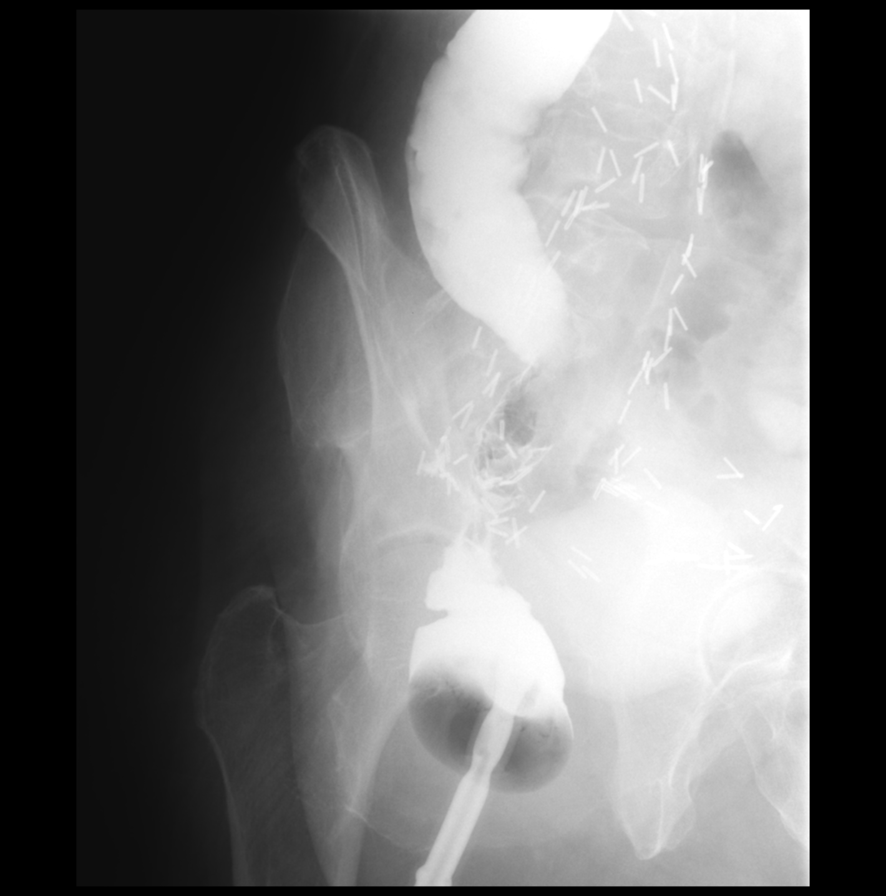

[Series 8: fluoro_barium singleshot_bw · 0.18mm/px · 1 of 1 slices shown (3 of 4)]
[im 1/1]
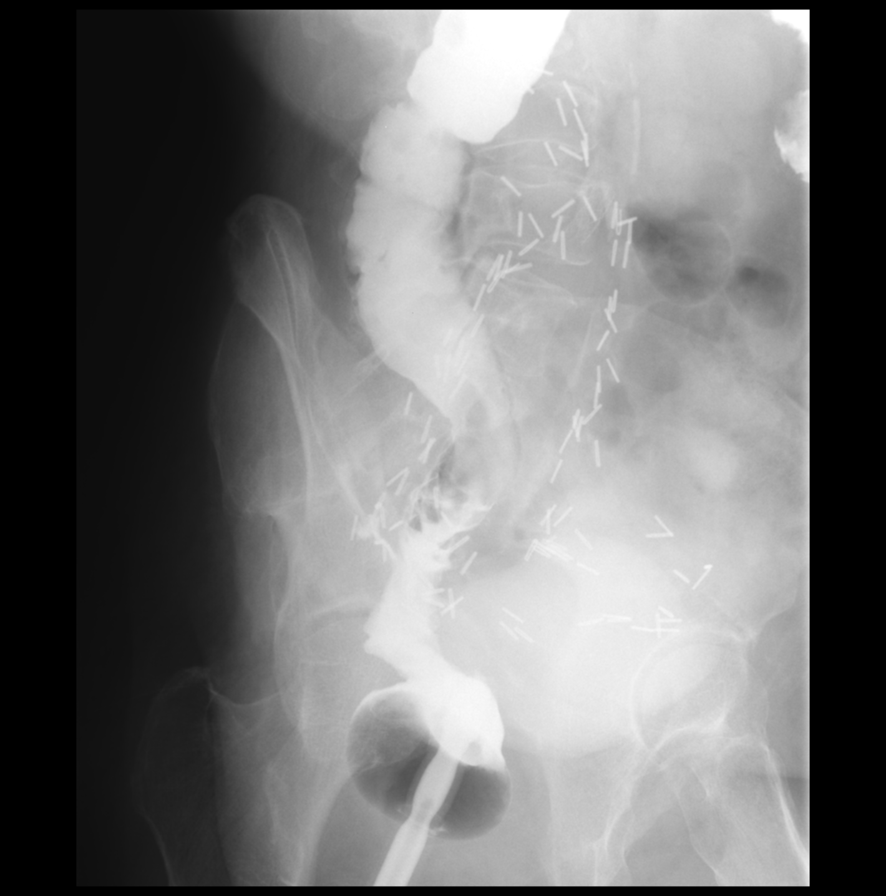

[Series 9: cp_standard · 0.53mm/px · 2 of 41 frames shown (1 of 4)]
[frame 21/41]
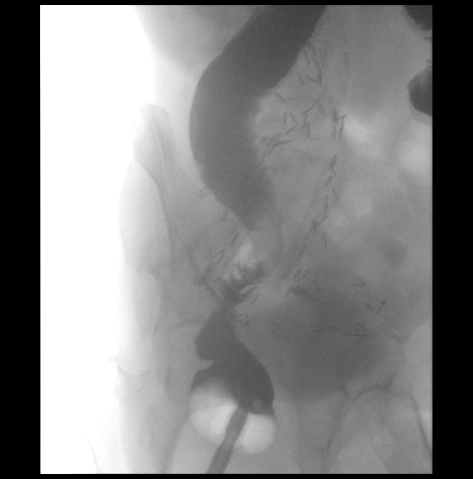
[frame 41/41]
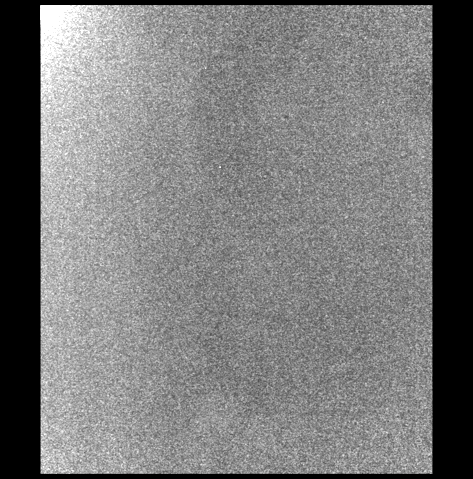

[Series 10: cp_standard · 0.54mm/px · 1 of 19 frames shown (2 of 4)]
[frame 10/19]
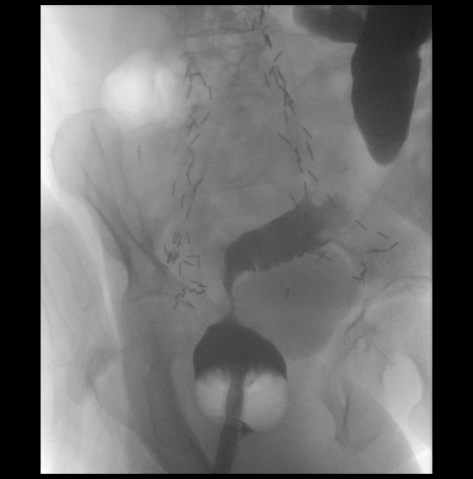

[Series 11: fluoro_barium singleshot_bw · 0.18mm/px · 1 of 1 slices shown (4 of 4)]
[im 1/1]
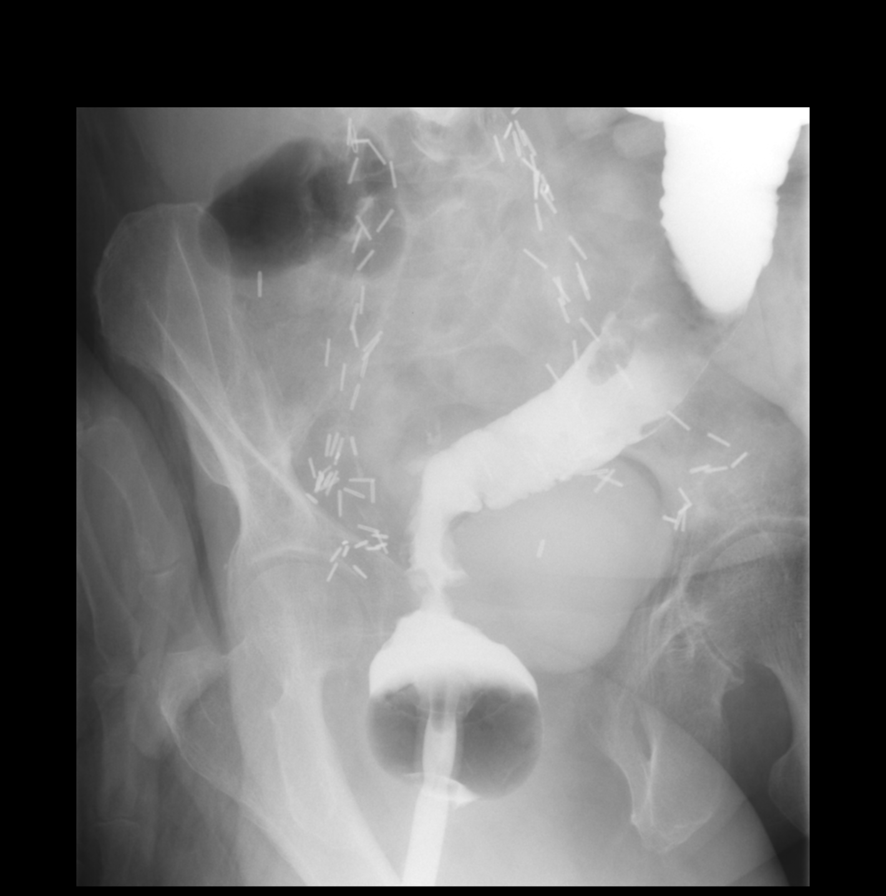

[Series 12: cp_standard · 0.54mm/px · 2 of 37 frames shown (3 of 4)]
[frame 19/37]
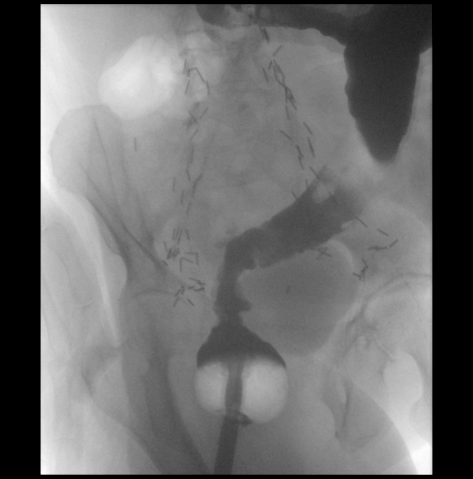
[frame 34/37]
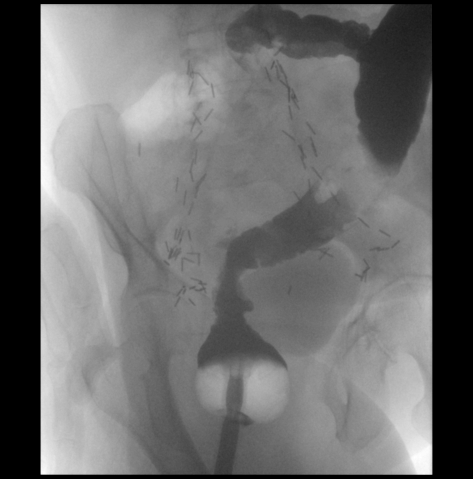

[Series 13: cp_standard · 0.54mm/px · 2 of 46 frames shown (4 of 4)]
[frame 24/46]
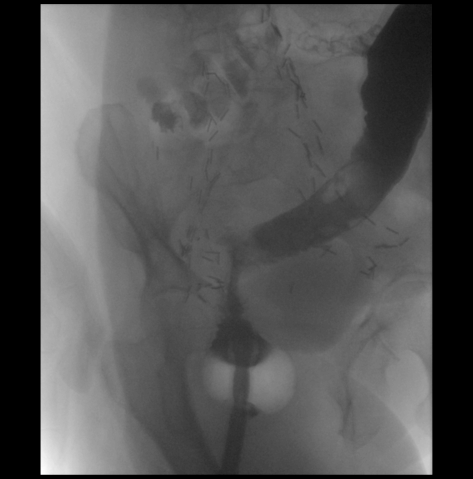
[frame 41/46]
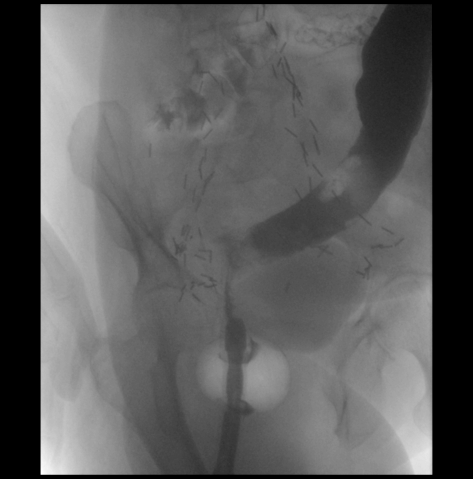

[12 of 24 positions shown; findings below may reference images not displayed]

FINDINGS: Preprocedure KUB demonstrated numerous surgical clips throughout the
pelvic sidewall bilaterally extending into the retroperitoneal
region, presumably from prior lymph node dissection. A suture line
in the mid anatomic pelvis was also noted. Nonobstructive bowel gas
pattern.

The patient was initially imaged in the LPO position, and upon
infusion of water-soluble contrast via the rectum, the rectum and
colon were opacified. This demonstrated some narrowing at the site
of the anastomosis which appeared rather mild. At the time of the
examination, no obvious extravasation was noted. However, upon
further evaluation of the images at the work station, subtle areas
of contrast extravasation are noted posteriorly and to the right
side at the level of the anastomosis.
IMPRESSION: 1. Study is positive for anastomotic leak. In addition, there is
mild luminal narrowing at the rectosigmoid anastomosis.
These results were called by telephone at the time of interpretation
on 09/03/2015 at [DATE] to Dr. GMAZ QUADRI, who verbally
acknowledged these results.

## 2017-03-01 IMAGING — CT CT ABD-PELV W/ CM
2 of 5 series · 16 of 46 positions shown, 18 images · IV contrast (iopamidol)
Comparison: 07/30/2015.

CLINICAL DATA: Mucus tinged stools, height ileostomy output.

EXAM:
CT ABDOMEN AND PELVIS WITH CONTRAST
TECHNIQUE: Multidetector CT imaging of the abdomen and pelvis was performed
using the standard protocol following bolus administration of
intravenous contrast.
CONTRAST:  100mL [id number] IOPAMIDOL ([id number]) INJECTION 61%

[Series 2: rtn a/p with · axial · 0.74mm/px · z∈[-436,-36]mm · 13 of 94 slices shown, 15 images]
[im 7/94  soft-tissue]
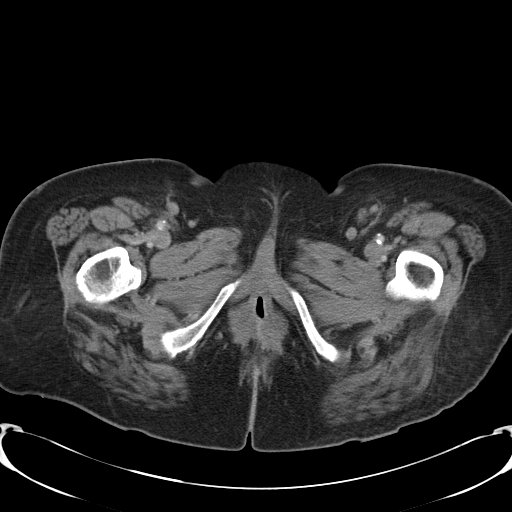
[im 7/94  bone]
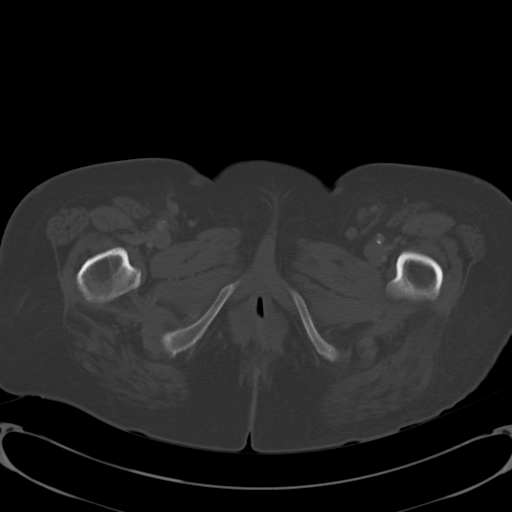
[im 14/94  soft-tissue]
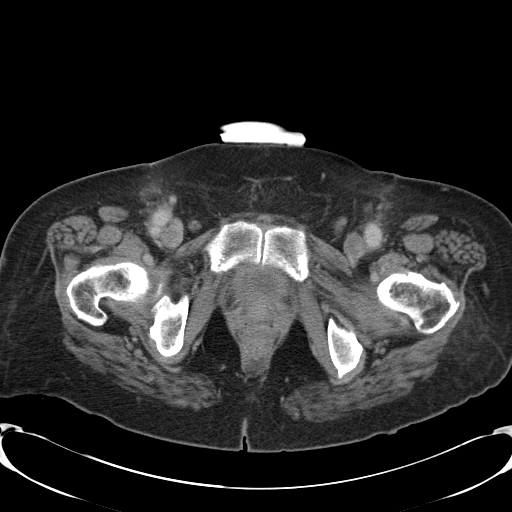
[im 20/94  soft-tissue]
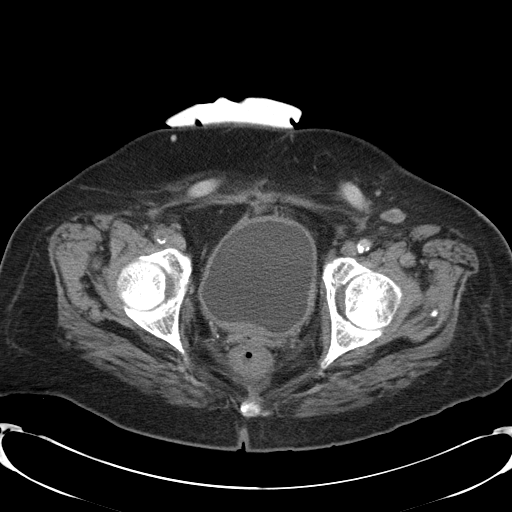
[im 27/94  soft-tissue]
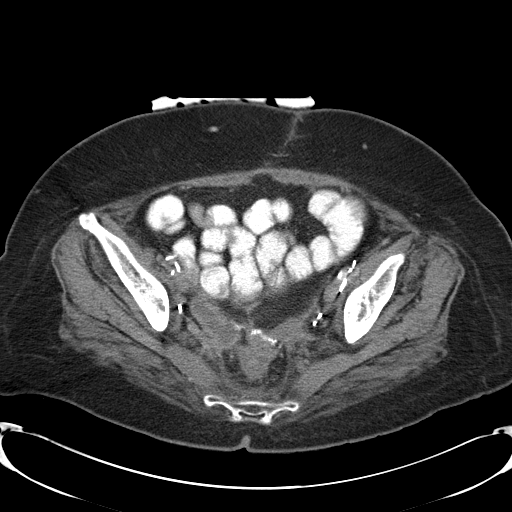
[im 34/94  soft-tissue]
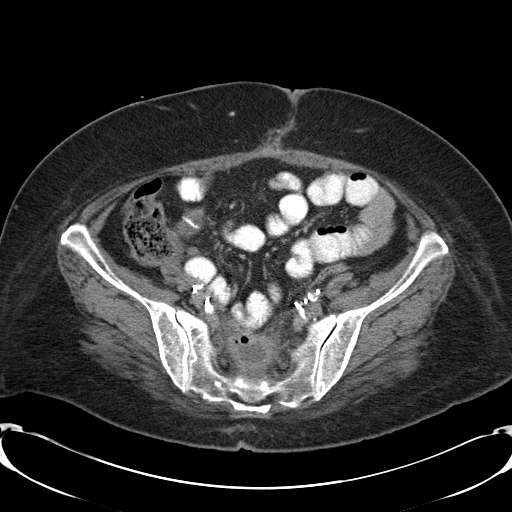
[im 40/94  soft-tissue]
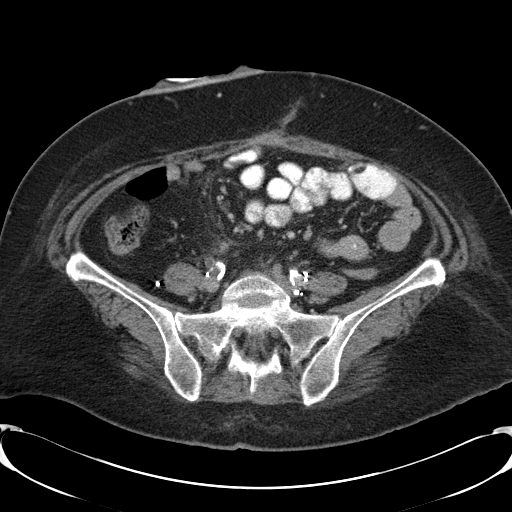
[im 47/94  soft-tissue]
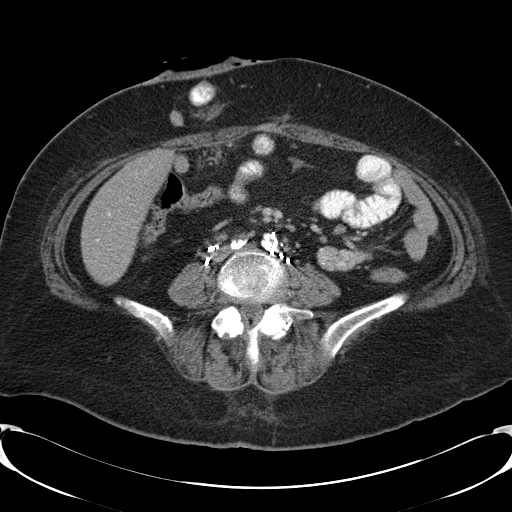
[im 54/94  soft-tissue]
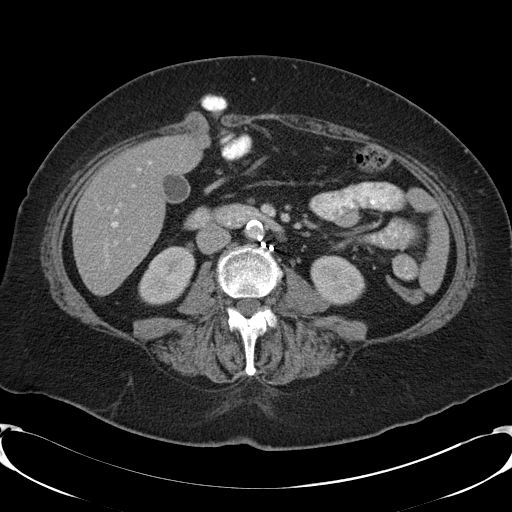
[im 60/94  soft-tissue]
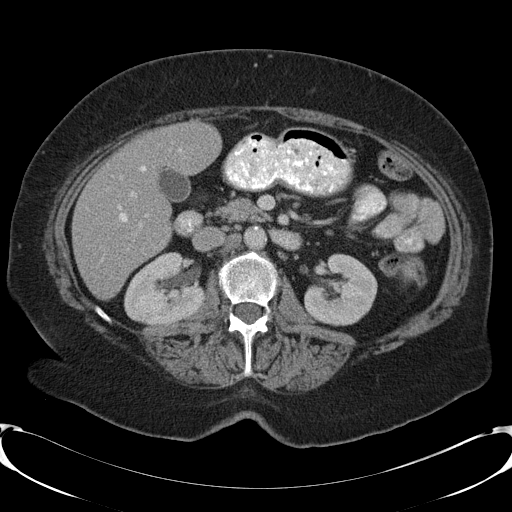
[im 60/94  bone]
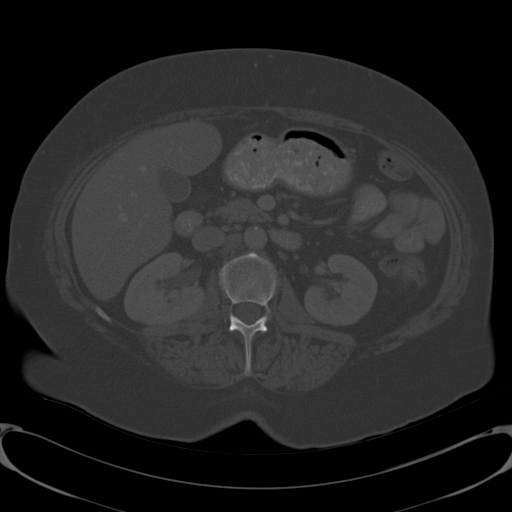
[im 67/94  soft-tissue]
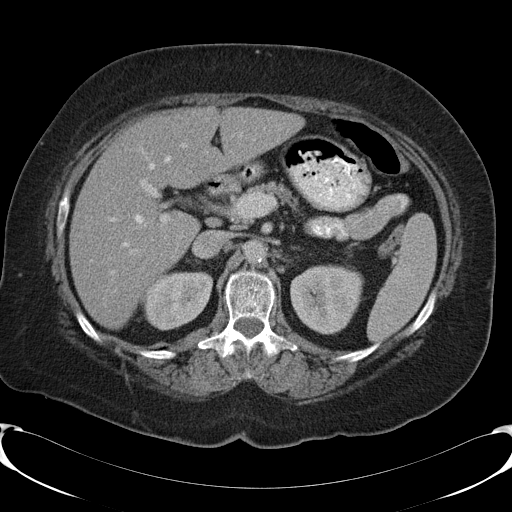
[im 74/94  soft-tissue]
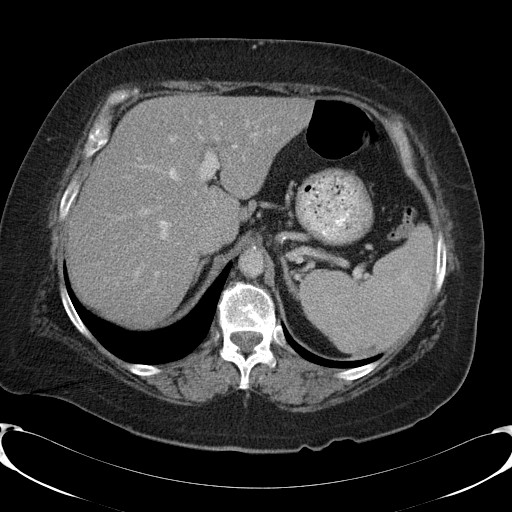
[im 80/94  soft-tissue]
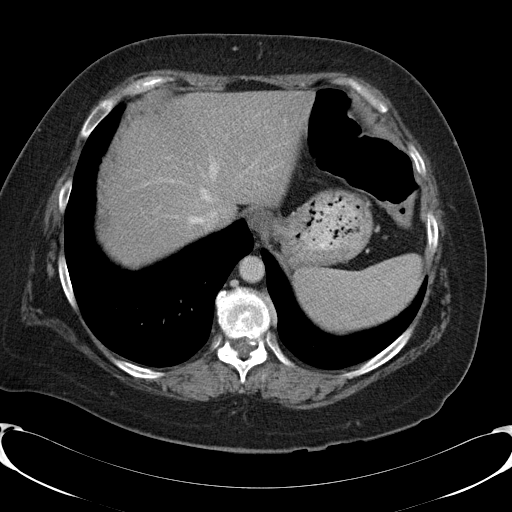
[im 87/94  soft-tissue]
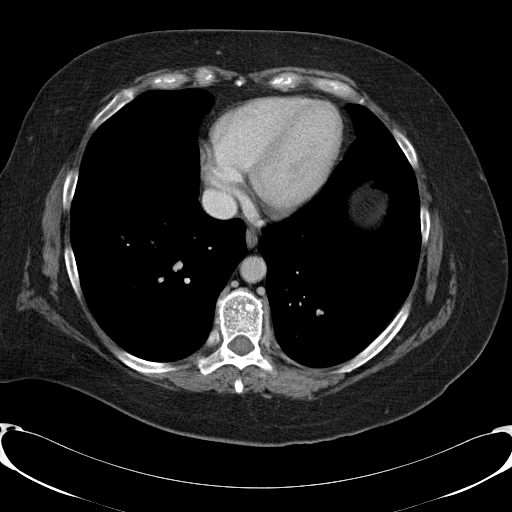

[Series 602: <mpr thick range> · coronal · 0.91mm/px · 3 of 147 slices shown]
[im 49/147  soft-tissue]
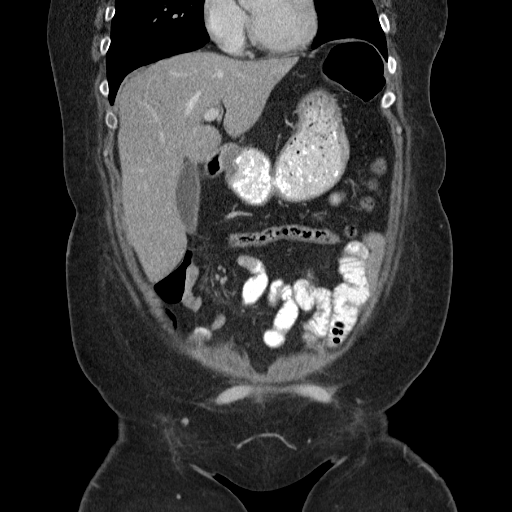
[im 65/147  soft-tissue]
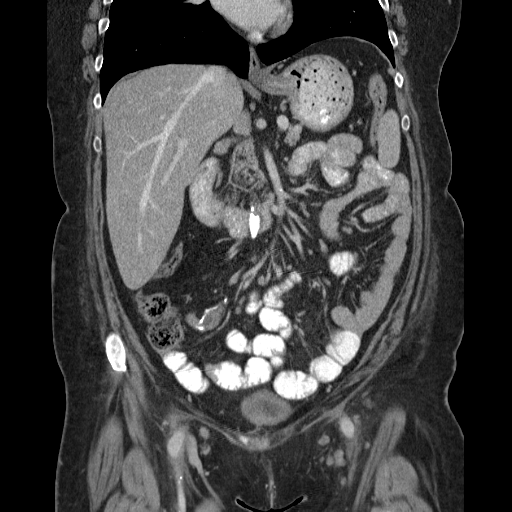
[im 82/147  soft-tissue]
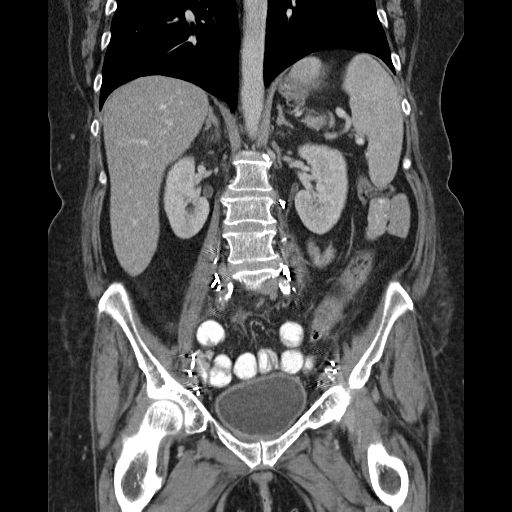

[16 of 46 positions shown; findings below may reference images not displayed]

FINDINGS: Lower chest: Lung bases show a triangular-shaped subpleural 5 mm
nodule in the right middle lobe, unchanged from 06/13/2014. Heart
size normal. No pericardial or pleural effusion.

Hepatobiliary: Liver measures 20.5 cm. No focal lesion. Gallbladder
is unremarkable. No biliary ductal dilatation.

Pancreas: Negative.

Spleen: Negative.

Adrenals/Urinary Tract: Adrenal glands and kidneys are unremarkable.
Minimal right ureteral dilatation. Left ureter is decompressed.
Bladder is unremarkable.

Stomach/Bowel: Stomach and majority of the small bowel are
unremarkable. Right mid abdominal ileostomy. Postoperative changes
in the distal ileum as well as rectosigmoid colon. There may be a
rectal wall thickening.

Vascular/Lymphatic: Atherosclerotic calcification of the arterial
vasculature. Aortobifemoral bypass grafting. Porta hepatis lymph
nodes measure up to 1.4 cm, stable. Retroperitoneal lymph nodes are
not enlarged by CT size criteria. Inguinal lymph nodes measure up to
10 mm on the right, possibly reactive.

Reproductive: Hysterectomy.  Ovaries are not well-visualized.

Other: Elongated collection of fluid and air in the right anatomic
pelvis measures 2.3 x 7.6 cm (series 2, image 60), unchanged when
remeasured on 07/30/2015. It is situated in close proximity to the
sigmoid anastomosis (cyst image 63). Internal air is new when
compared with 07/30/2015.

Musculoskeletal: No worrisome lytic or sclerotic lesions.
IMPRESSION: 1. Collection of fluid and air in the right posterior anatomic
pelvis. Air within is new and could be due to an abscess.
Anastomotic leak is not excluded.
2. Question rectal wall thickening.
3. Hepatomegaly.

## 2017-03-03 IMAGING — CT CT IMAGE GUIDED DRAINAGE BY PERCUTANEOUS CATHETER
1 of 2 series · 22 of 26 positions shown, 29 images · non-contrast
Comparison: none

INDICATION: 54-year-old female status Blain Jumper with loop ileostomy and ureteral
stent placement for distal colonic stricture following cervical
cancer resection and radiation therapy. A fluid and gas collection
is present in the deep anatomic pelvis adjacent to the rectum.
Barium enema demonstrates an anastomotic leak. She presents for
CT-guided drainage.
TECHNIQUE: Informed written consent was obtained from the patient after a
thorough discussion of the procedural risks, benefits and
alternatives. All questions were addressed. Maximal Sterile Barrier
Technique was utilized including caps, mask, sterile gowns, sterile
gloves, sterile drape, hand hygiene and skin antiseptic. A timeout
was performed prior to the initiation of the procedure.

[Series 6: (hospital) 6.0 b30f · axial · 0.96mm/px · z∈[-196,-190]mm · 22 of 24 slices shown, 29 images]
[im 2/24  soft-tissue]
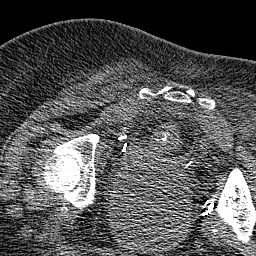
[im 2/24  bone]
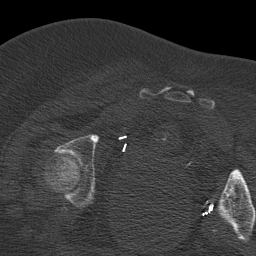
[im 3/24  soft-tissue]
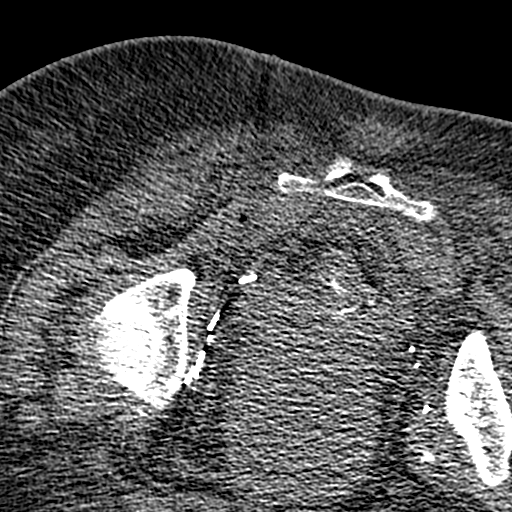
[im 4/24  soft-tissue]
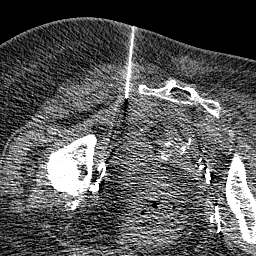
[im 5/24  soft-tissue]
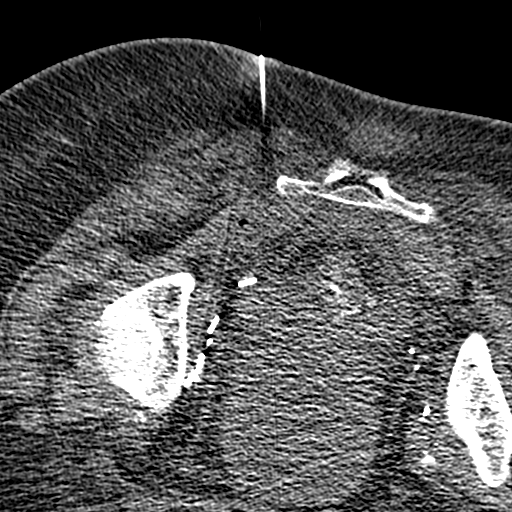
[im 6/24  soft-tissue]
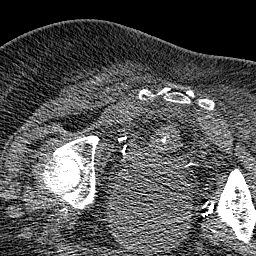
[im 7/24  soft-tissue]
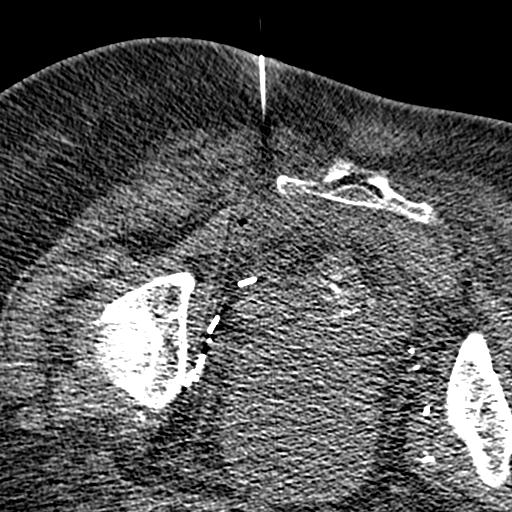
[im 8/24  soft-tissue]
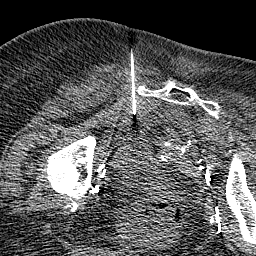
[im 9/24  soft-tissue]
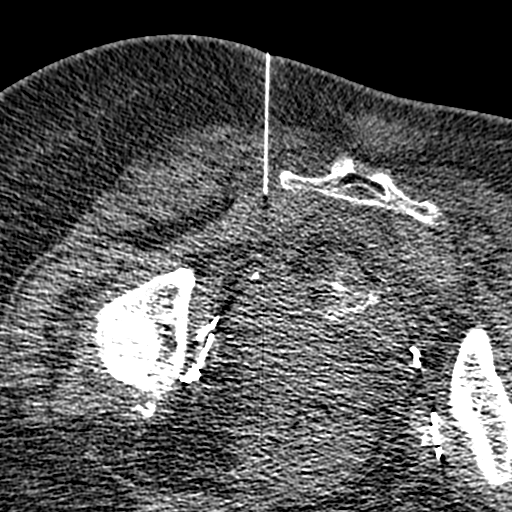
[im 10/24  soft-tissue]
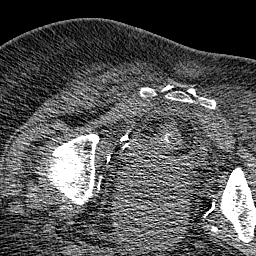
[im 10/24  bone]
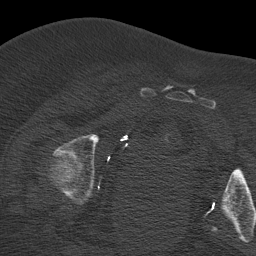
[im 11/24  soft-tissue]
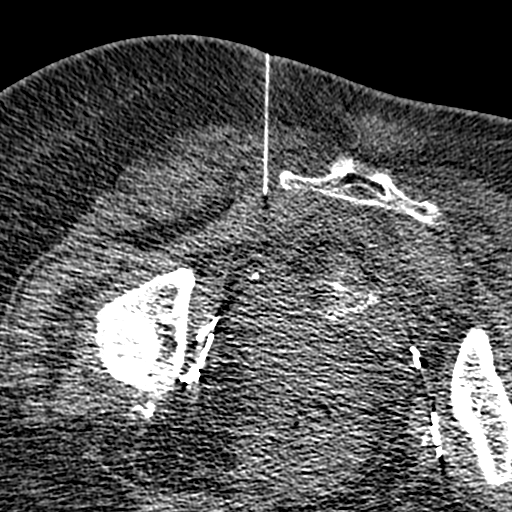
[im 12/24  soft-tissue]
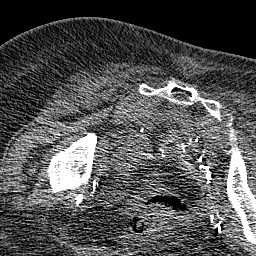
[im 13/24  soft-tissue]
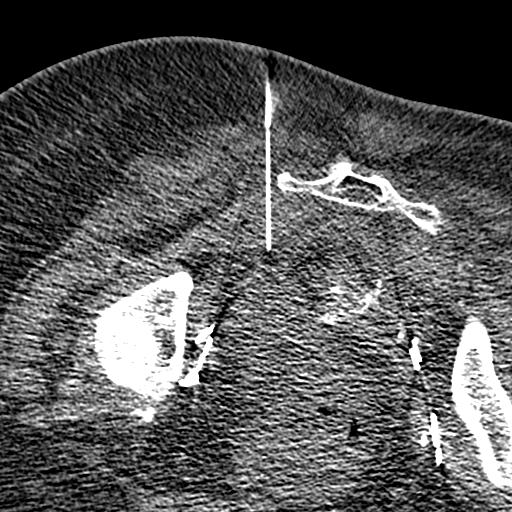
[im 13/24  lung]
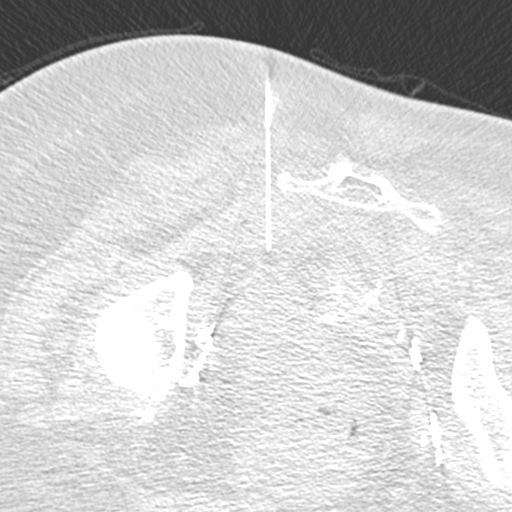
[im 14/24  soft-tissue]
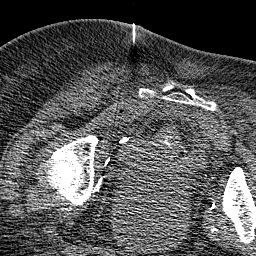
[im 15/24  soft-tissue]
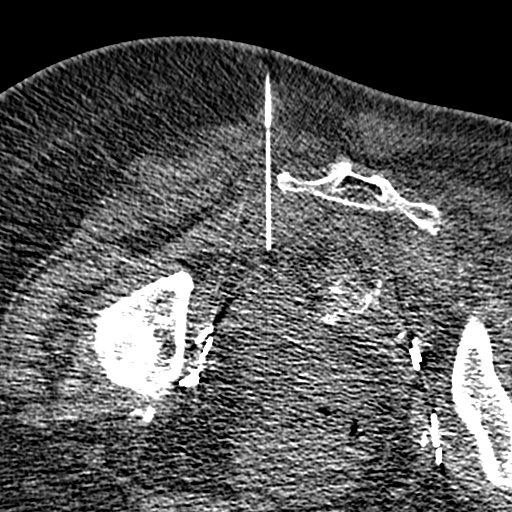
[im 16/24  soft-tissue]
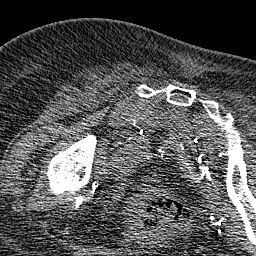
[im 17/24  soft-tissue]
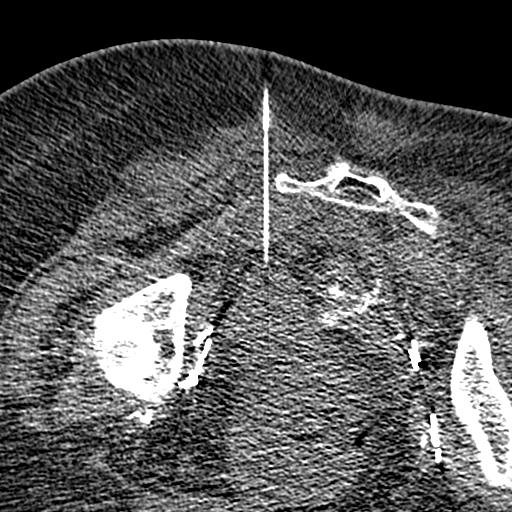
[im 18/24  soft-tissue]
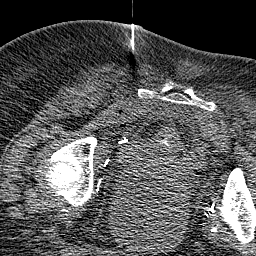
[im 18/24  bone]
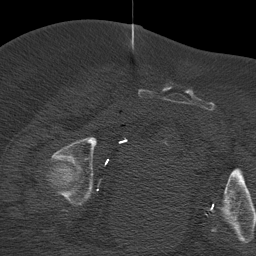
[im 19/24  soft-tissue]
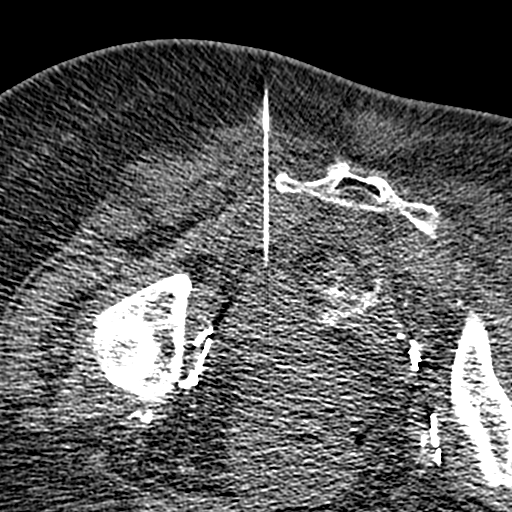
[im 20/24  soft-tissue]
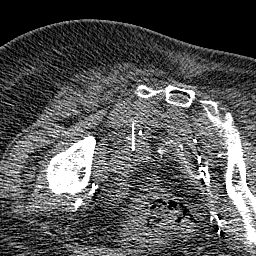
[im 21/24  soft-tissue]
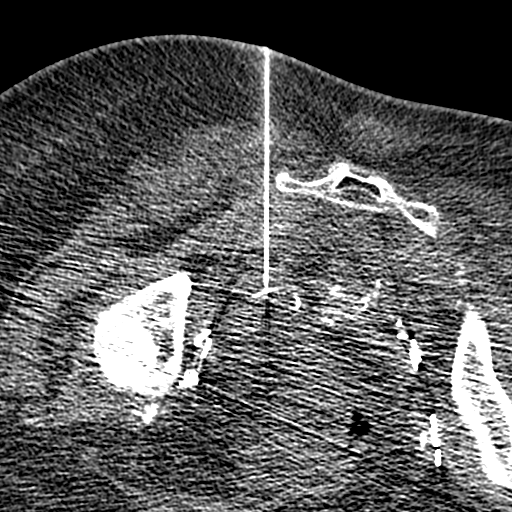
[im 21/24  lung]
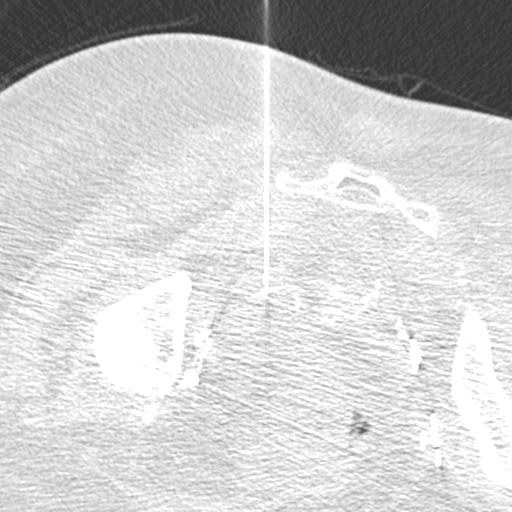
[im 22/24  soft-tissue]
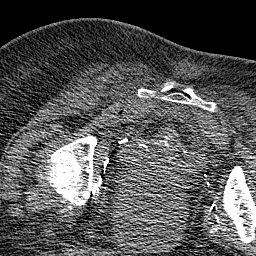
[im 22/24  lung]
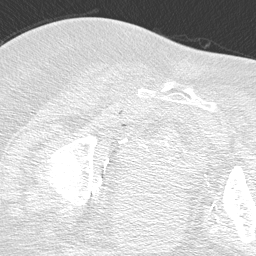
[im 23/24  soft-tissue]
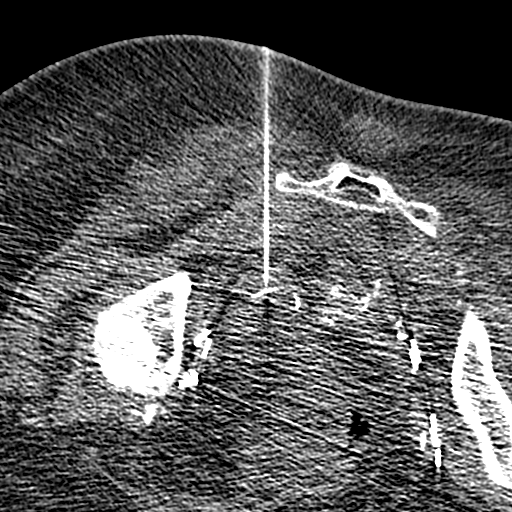
[im 23/24  lung]
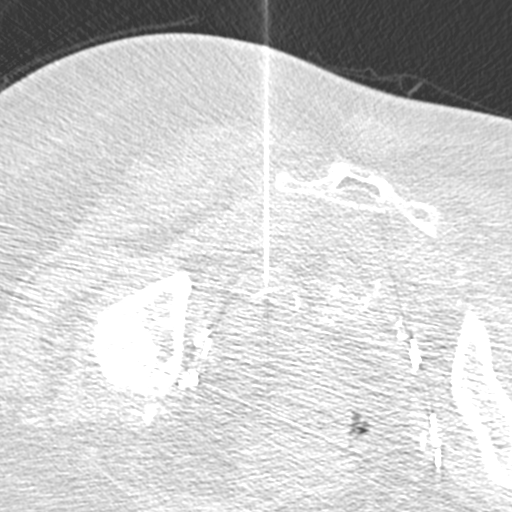

[22 of 26 positions shown; findings below may reference images not displayed]

EXAM:
CT GUIDED DRAINAGE OF  ABSCESS

MEDICATIONS:
The patient is currently admitted to the hospital and receiving
intravenous antibiotics. The antibiotics were administered within an
appropriate time frame prior to the initiation of the procedure.

ANESTHESIA/SEDATION:
2 mg IV Versed 100 mcg IV Fentanyl

Moderate Sedation Time:  23

The patient was continuously monitored during the procedure by the
interventional radiology nurse under my direct supervision.

COMPLICATIONS:
None immediate.
PROCEDURE:
The operative field was prepped with Chlorhexidine in a sterile
fashion, and a sterile drape was applied covering the operative
field. A sterile gown and sterile gloves were used for the
procedure. Local anesthesia was provided with 1% Lidocaine.

Using intermittent CT fluoroscopic guidance, an 18 gauge trocar
needle was advanced via a right trans gluteal approach into the
right perirectal fluid collection. An Amplatz wire was coiled in the
collection. The needle was removed and the skin tract serially
dilated to 10 French. Dyana Desai 10 French all-purpose drainage catheter
was then advanced over the wire and formed within the fluid
collection. Aspiration yields 16 mL thick, purulent foul-smelling
material.

Axial CT imaging confirms excellent positioning of the drainage
catheter. There is no evidence of complication. The drain was
secured to the skin with 0 Prolene suture and an adhesive fixation
device. The drain was connected to JP bulb suction.
FINDINGS: Aspiration yields thick, purulent and foul smelling material.
IMPRESSION: Successful placement of a 10 French percutaneous drainage catheter
via a right trans gluteal approach. Aspiration yields thick,
foul-smelling purulent material.

## 2017-03-10 IMAGING — CT CT ABD-PELV W/ CM
3 of 4 series · 11 of 36 positions shown, 17 images · IV contrast ([ID] ISOVUE 300)
Comparison: 09/03/2015

CLINICAL DATA: Follow-up pelvic abscess and drainage catheter.
History of rectal stricture and status Libia Casique in June 2015.
Patient has a loop ileostomy. Pelvic abscess collection was drained
with a percutaneous drain on 09/05/2015. Patient notes minimal
drainage for the past 3 days.

EXAM:
CT ABDOMEN AND PELVIS WITH CONTRAST
TECHNIQUE: Multidetector CT imaging of the abdomen and pelvis was performed
using the standard protocol following bolus administration of
intravenous contrast.
CONTRAST:  125mL [id number] IOPAMIDOL (EFOZL6-BKK) INJECTION 61%

[Series 3: abd/pelvis with · axial · 0.88mm/px · z∈[-410,-95]mm · 7 of 85 slices shown, 12 images]
[im 11/85  soft-tissue]
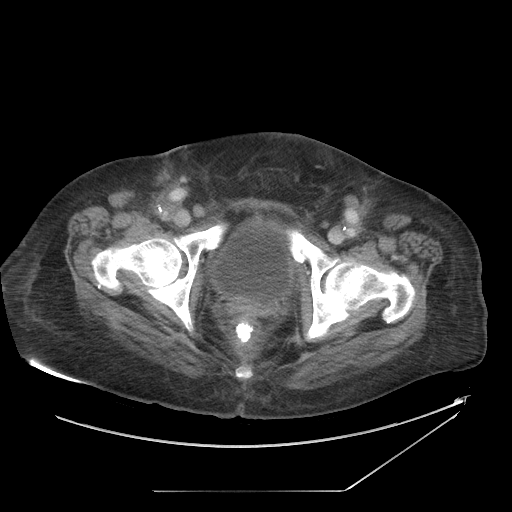
[im 11/85  bone]
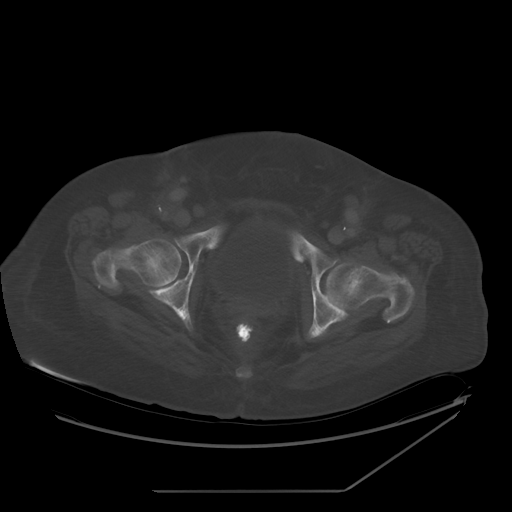
[im 22/85  soft-tissue]
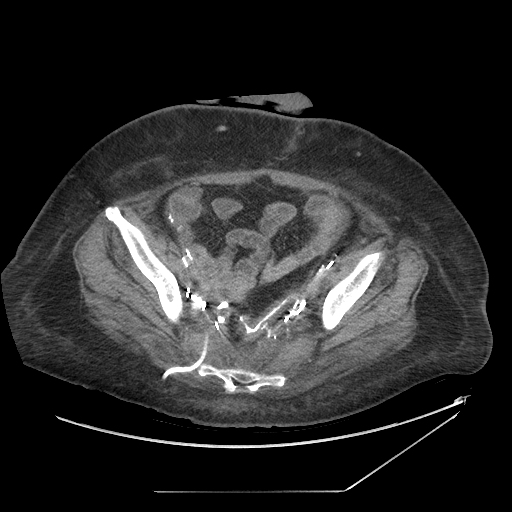
[im 32/85  soft-tissue]
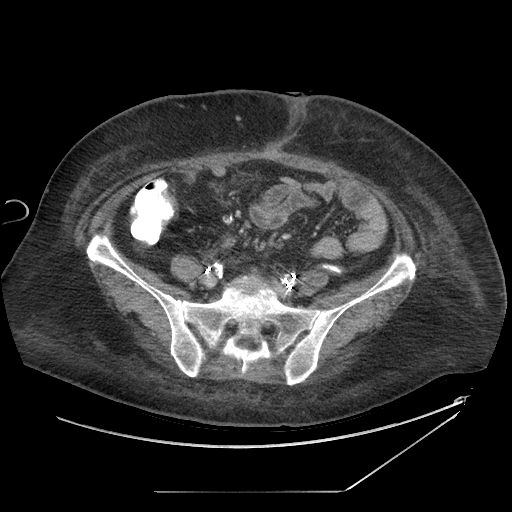
[im 43/85  soft-tissue]
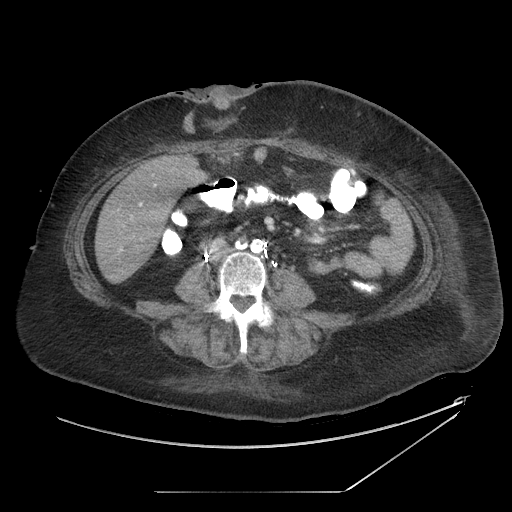
[im 43/85  lung]
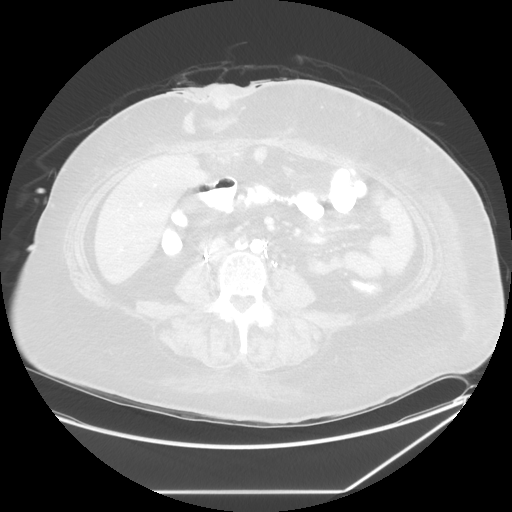
[im 53/85  soft-tissue]
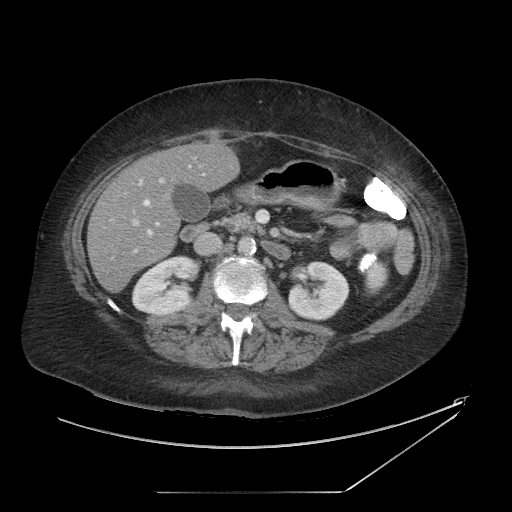
[im 53/85  lung]
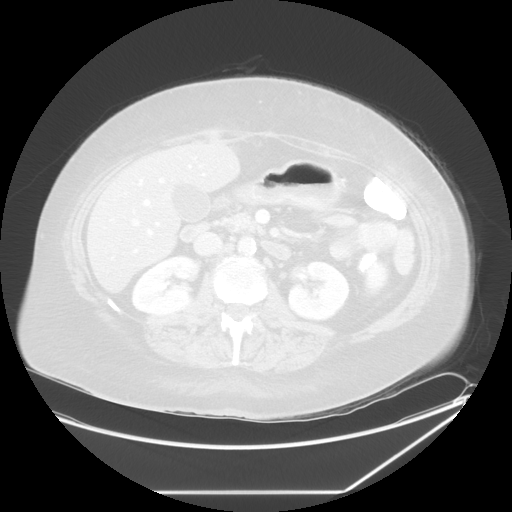
[im 64/85  soft-tissue]
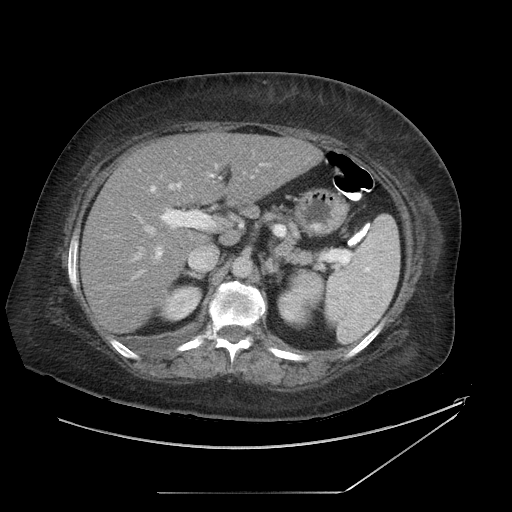
[im 64/85  lung]
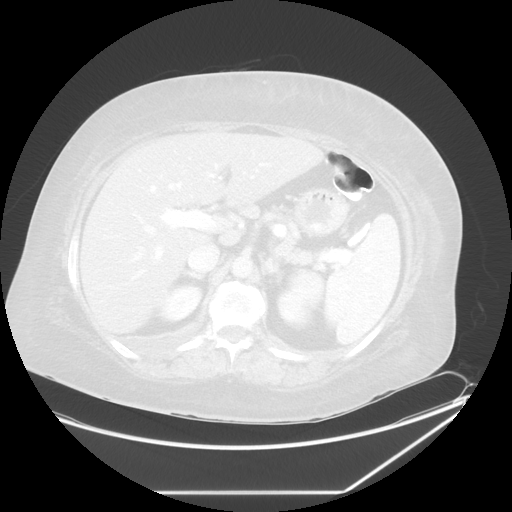
[im 74/85  soft-tissue]
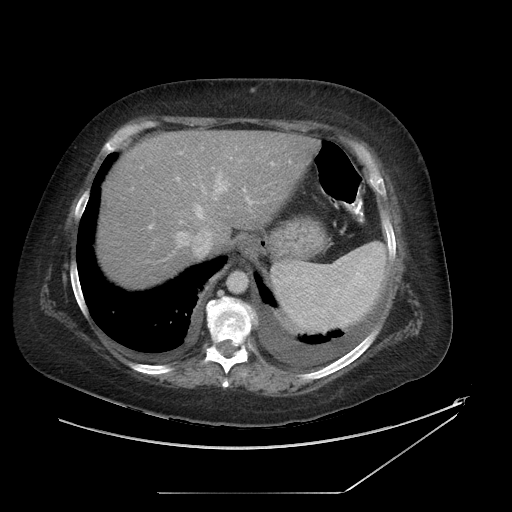
[im 74/85  lung]
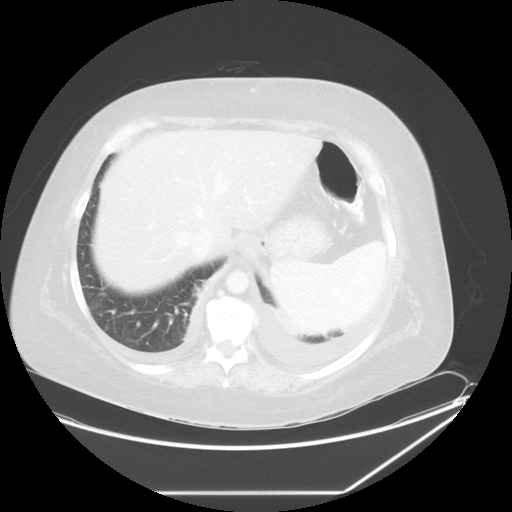

[Series 601: coronal body · coronal · 0.88mm/px · 1 of 130 slices shown, 2 images]
[im 44/130  soft-tissue]
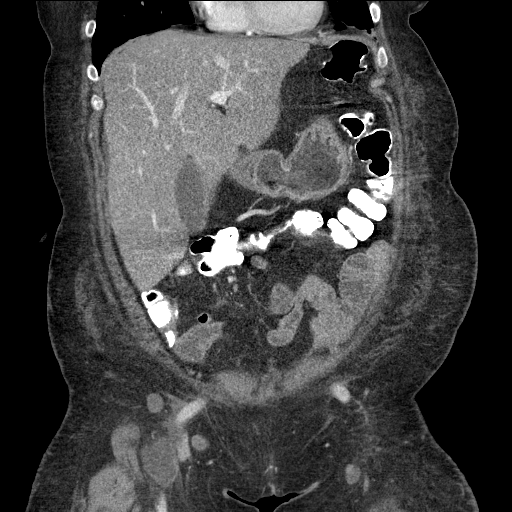
[im 44/130  bone]
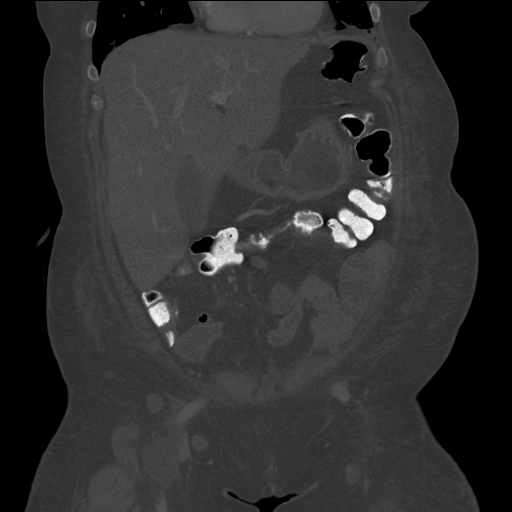

[Series 602: sagittal body · sagittal · 0.88mm/px · 3 of 181 slices shown]
[im 19/181  soft-tissue]
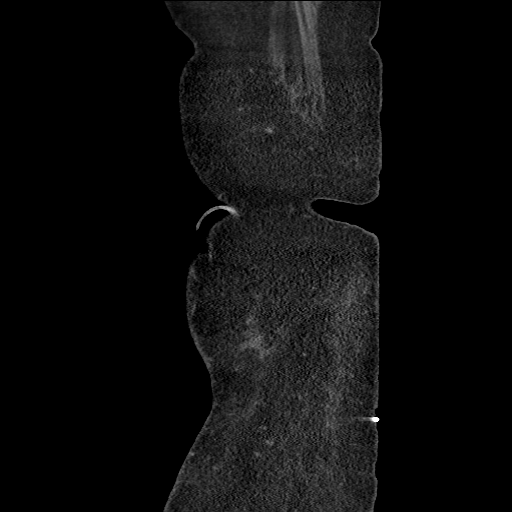
[im 38/181  soft-tissue]
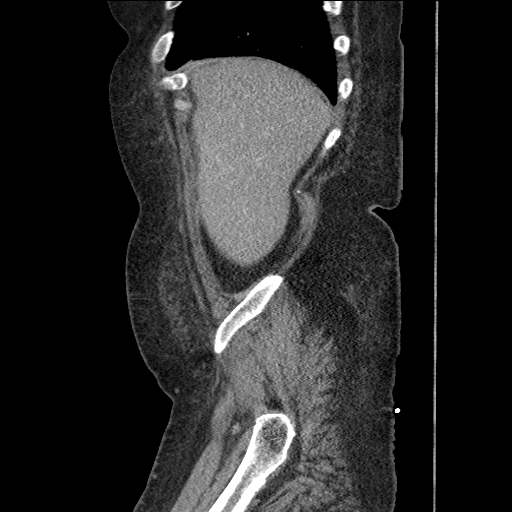
[im 57/181  soft-tissue]
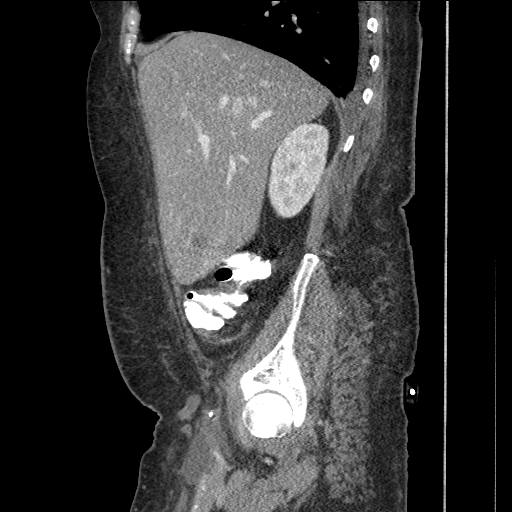

[11 of 36 positions shown; findings below may reference images not displayed]

FINDINGS: Lower chest: There is a 5 mm nodule in the posterior right lower
lobe on sequence 4, image 1. This was not present on the recent
comparisons exam and suspect this is related to atelectasis.
Development of small bilateral pleural effusions.

Hepatobiliary: Low density of the liver is suggestive for hepatic
steatosis. Normal appearance of the gallbladder without biliary
dilatation. Portal venous system is patent.

Pancreas: Normal appearance of the pancreas without dilatation.

Spleen: Normal appearance of spleen without enlargement.

Adrenals/Urinary Tract: Normal adrenal glands. Normal appearance of
both kidneys without hydronephrosis. Small amount of fluid in
urinary bladder.

Stomach/Bowel: Again noted is loop ileostomy and there is no
significant bowel dilatation. Again noted is wall thickening in the
distal colon containing high-density contrast. There is a bowel
anastomosis along the posterior upper pelvis. High-density contrast
throughout the colon. Chronic soft tissue thickening in the
presacral space adjacent to the colonic anastomosis and percutaneous
drain.

Vascular/Lymphatic: Patient has a patent fem-fem bypass graft. There
is increased fluid around the right common femoral artery from the
previous examination. The distal abdominal aorta and iliac arteries
are heavily calcified and there appears to be occlusion of the right
common iliac artery. Left iliac arteries are heavily calcified and
poorly characterized on this non arterial examination. Again noted
are prominent bilateral inguinal lymph nodes which are likely
reactive. Small periaortic and retroperitoneal lymph nodes are
similar to the previous examination.

Reproductive: Uterus has been removed.

Other: Increased subcutaneous edema throughout the abdomen and
pelvis. New fluid around the right common femoral artery as
described in the vascular section.

There is a percutaneous drainage catheter in the right posterior
pelvis from a transgluteal approach. The elongated fluid collection
in this area has essentially resolved. There appears to be a
decompressed cavity in the presacral space. No new abscess or fluid
collections.

Musculoskeletal: No suspicious bone findings.
IMPRESSION: The posterior pelvic fluid collection with the drainage catheter has
resolved. No new abscess collections.

Increased subcutaneous fluid throughout the abdomen and pelvis with
development of small pleural effusions. Findings raise concern for
volume overload.

Diffuse atherosclerotic disease with a patent femoral-femoral bypass
graft as described.

Persistent wall thickening in the distal colon/rectum.

## 2017-03-10 IMAGING — RF DG SINUS / FISTULA TRACT / ABSCESSOGRAM
3 series · 6 of 6 positions shown · non-contrast
Comparison: none

INDICATION: 54-year-old with pelvic abscess.  Evaluate drain.

[Series 1: run · 2 of 2 slices shown (1 of 3)]
[im 1/2]
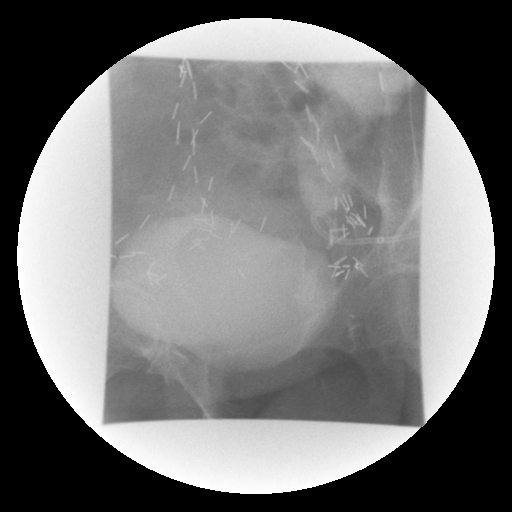
[im 2/2]
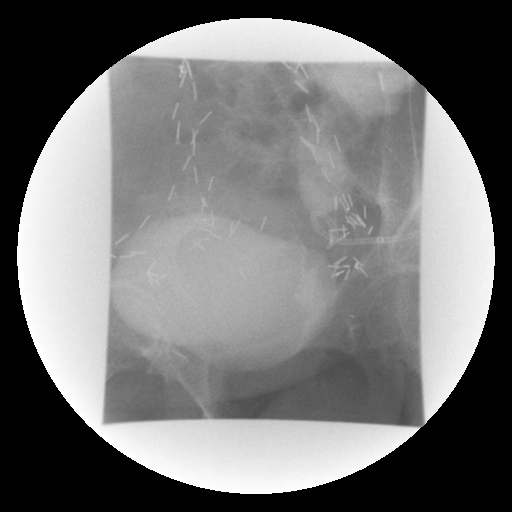

[Series 2: run · 2 of 2 slices shown (2 of 3)]
[im 1/2]
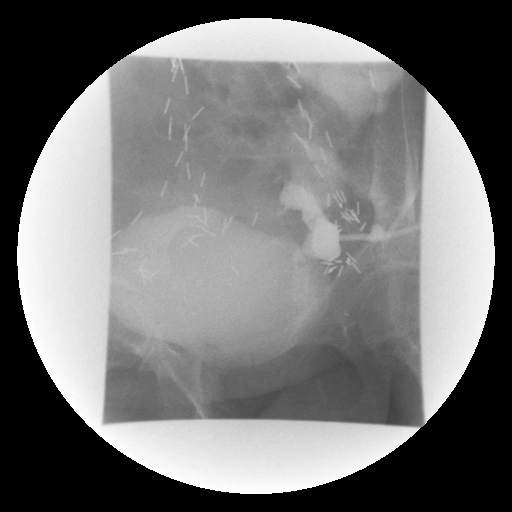
[im 2/2]
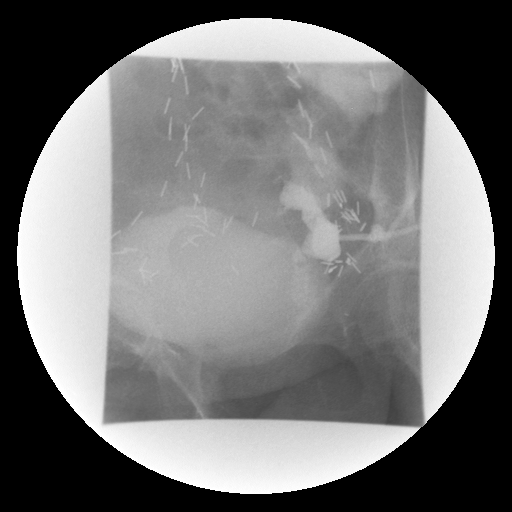

[Series 3: run · 2 of 2 slices shown (3 of 3)]
[im 1/2]
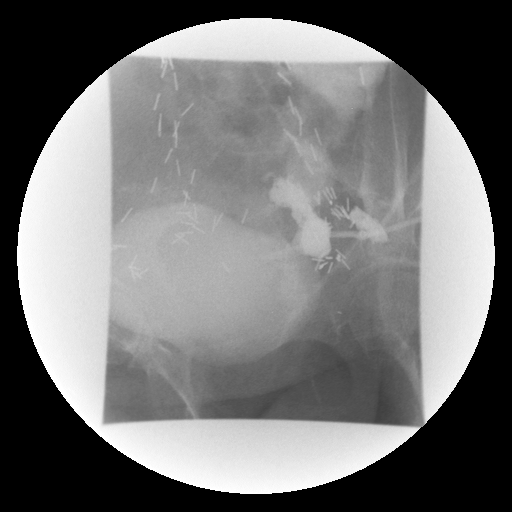
[im 2/2]
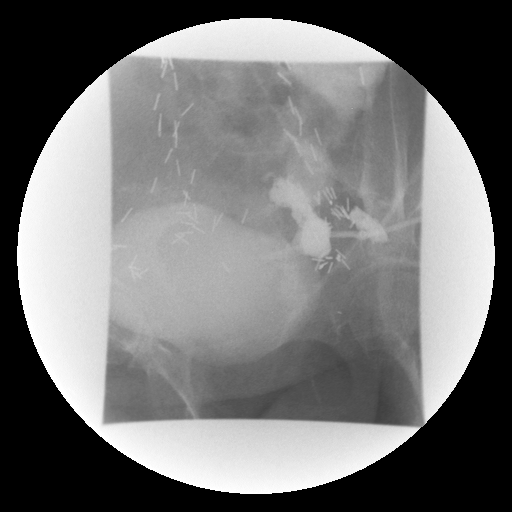

[6 of 6 positions shown; findings below may reference images not displayed]

EXAM:
ABSCESS INJECTION

MEDICATIONS:
None

ANESTHESIA/SEDATION:
None

Fluoroscopy time 18 seconds, 14 dGy

COMPLICATIONS:
None immediate.

PROCEDURE:
Patient was placed on the fluoroscopic table. The right transgluteal
drain was injected under fluoroscopy. 10 mL Omnipaque 300 was
injected. Catheter was cut and removed at the end of the procedure.
FINDINGS: There is contrast within the urinary bladder. Pigtail catheter in
the right hemipelvis. There is filling of a small residual abscess
cavity but no filling of adjacent bowel structures. Small amount of
contrast was draining around the catheter.
IMPRESSION: Small residual abscess cavity without a fistula. The cavity was
decompressed on the recent CT. In addition, the patient had minimal
drainage for 3 days. Therefore, the catheter was removed.

## 2017-06-01 IMAGING — CT CT ABD-PELV W/ CM
2 of 5 series · 16 of 46 positions shown, 18 images · IV contrast (iopamidol)
Comparison: 10/16/2015

CLINICAL DATA: Nausea/ vomiting. Distal colon stricture status post
low anterior resection with diverting ileostomy, complicated by
leak. Status post fem-fem bypass. Remote history of cervical cancer
status post hysterectomy.

EXAM:
CT ABDOMEN AND PELVIS WITH CONTRAST
TECHNIQUE: Multidetector CT imaging of the abdomen and pelvis was performed
using the standard protocol following bolus administration of
intravenous contrast.
CONTRAST:  100mL LMIXZS-CFF IOPAMIDOL (LMIXZS-CFF) INJECTION 61%

[Series 2: rtn a/p with · axial · 0.74mm/px · z∈[-502,-132]mm · 13 of 86 slices shown, 15 images]
[im 6/86  soft-tissue]
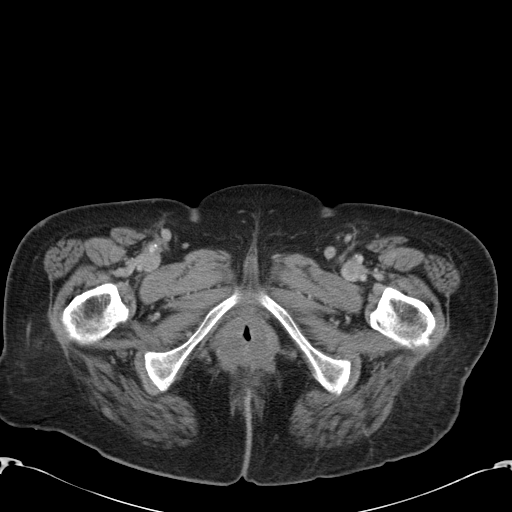
[im 6/86  bone]
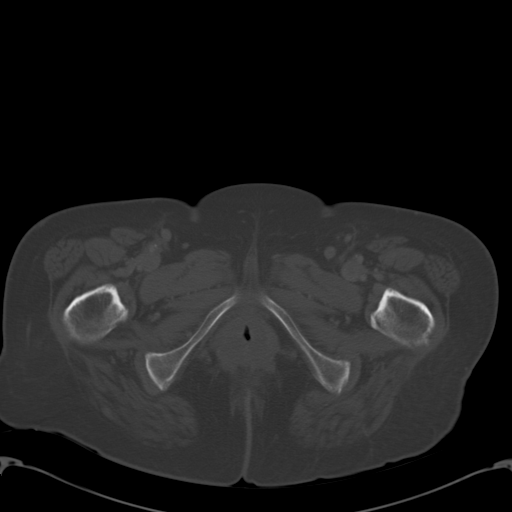
[im 12/86  soft-tissue]
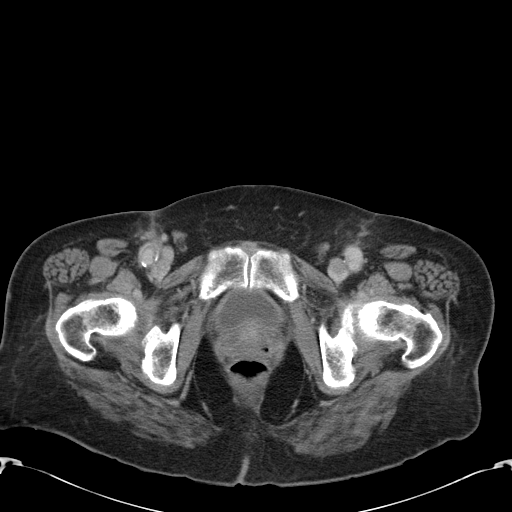
[im 18/86  soft-tissue]
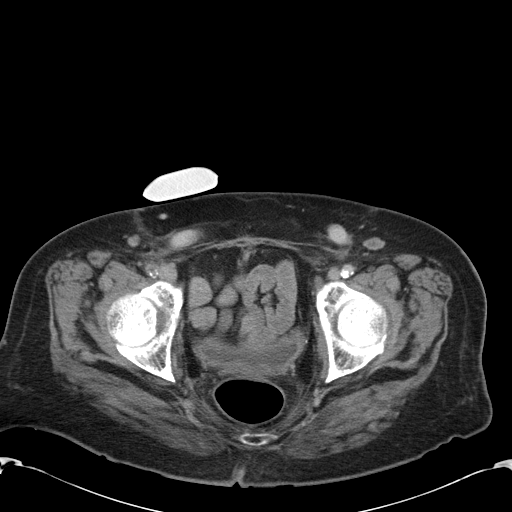
[im 23/86  soft-tissue]
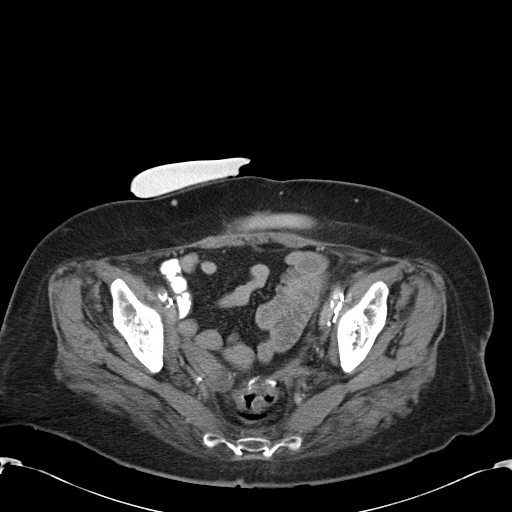
[im 29/86  soft-tissue]
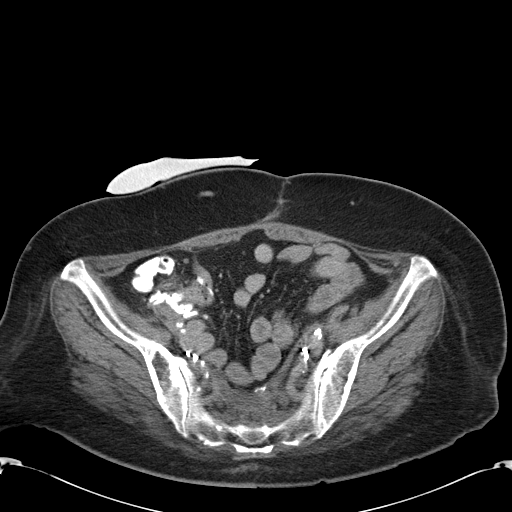
[im 35/86  soft-tissue]
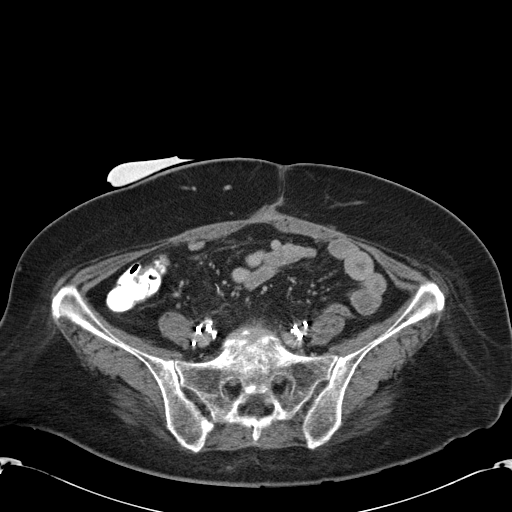
[im 46/86  soft-tissue]
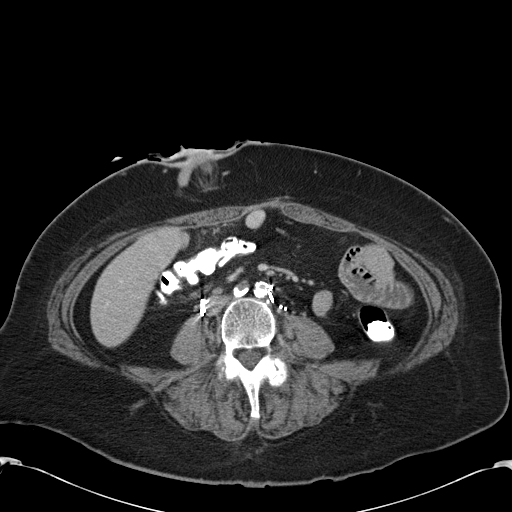
[im 52/86  soft-tissue]
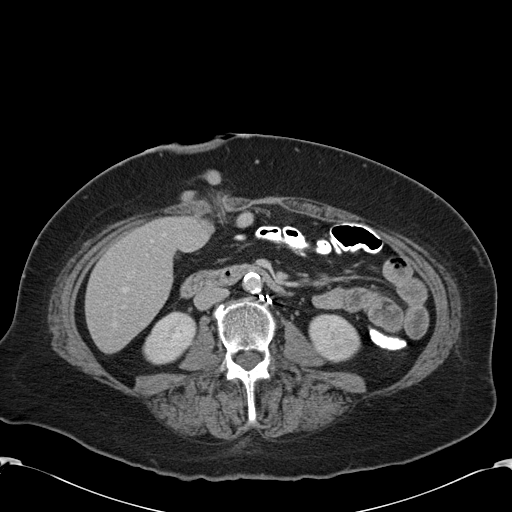
[im 57/86  soft-tissue]
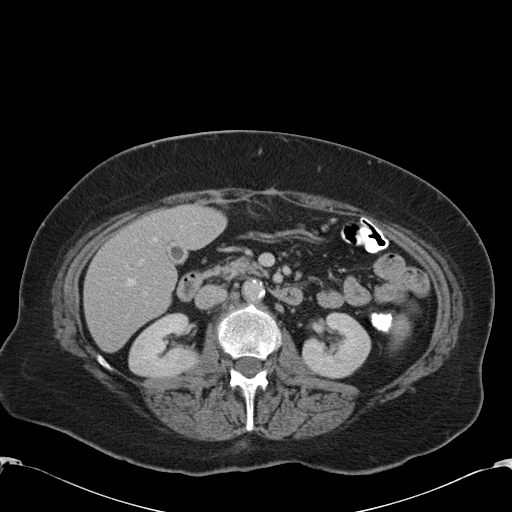
[im 57/86  bone]
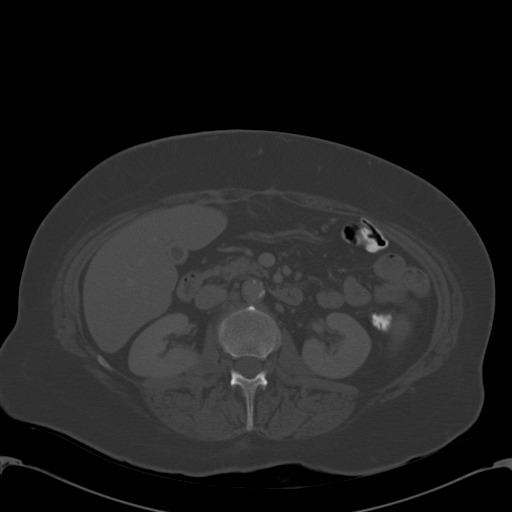
[im 63/86  soft-tissue]
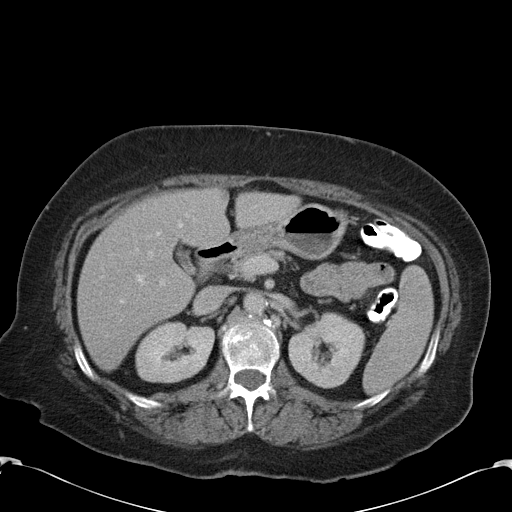
[im 69/86  soft-tissue]
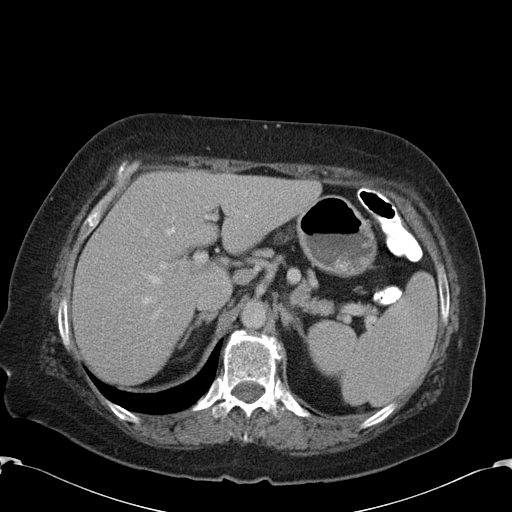
[im 74/86  soft-tissue]
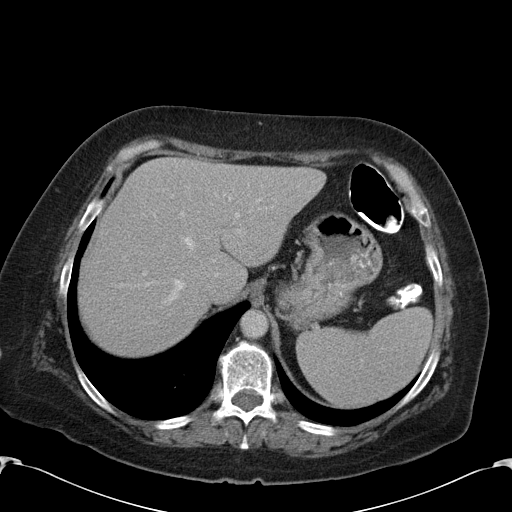
[im 80/86  soft-tissue]
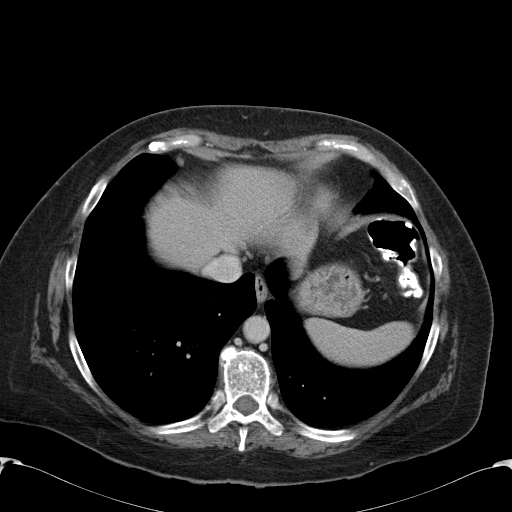

[Series 603: <mpr thick range> · coronal · 0.84mm/px · 3 of 137 slices shown]
[im 46/137  soft-tissue]
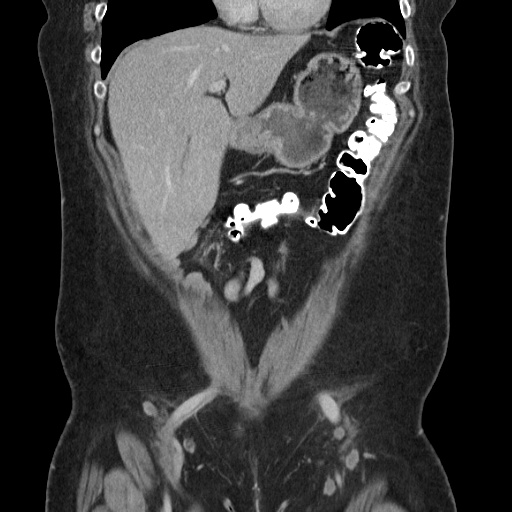
[im 61/137  soft-tissue]
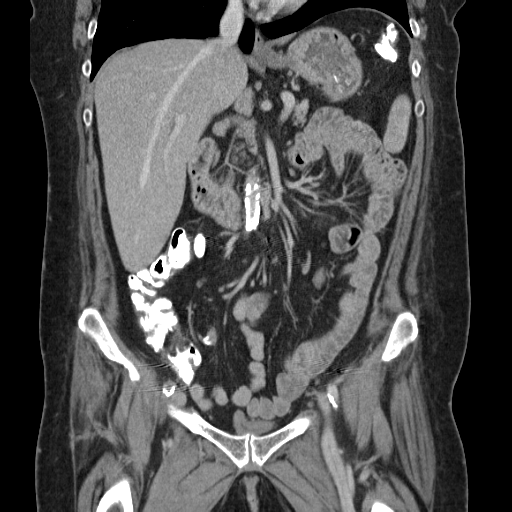
[im 76/137  soft-tissue]
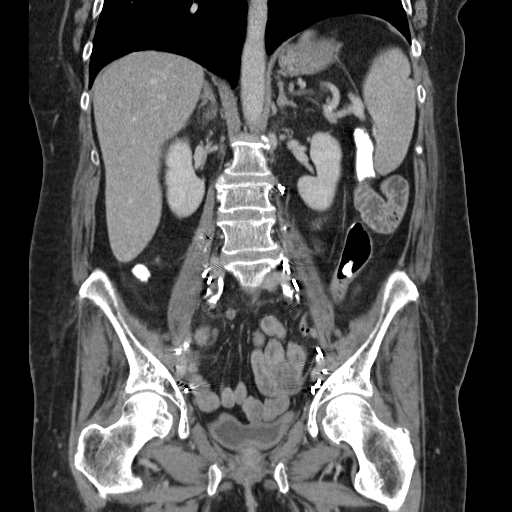

[16 of 46 positions shown; findings below may reference images not displayed]

FINDINGS: Lower chest:  Lung bases are clear.

Hepatobiliary: Liver is within normal limits. No
suspicious/enhancing hepatic lesions.

Gallbladder is underdistended but unremarkable.

Pancreas: Within normal limits.

Spleen: Within normal limits.

Adrenals/Urinary Tract: Adrenal glands are within normal limits.

Kidneys are within normal limits.  No hydronephrosis.

Bladder is mildly thick-walled although underdistended.

Stomach/Bowel: Stomach is within normal limits.

No evidence of bowel obstruction.

Status post low anterior section with suture line in the lower
pelvis (series 2/ image 61).

Diverting loop ileostomy in the right mid abdomen.

Vascular/Lymphatic: Atherosclerotic calcifications of the abdominal
aorta and branch vessels.

No evidence of abdominal aortic aneurysm.

Suspected occlusion of the right common iliac artery, diminutive/
irregular left common iliac artery, and patent fem-fem bypass.

Reproductive: Status post hysterectomy and bilateral
salpingo-oophorectomy.

Other: No abdominopelvic ascites.

Again seen in a small, thin-walled presacral fluid collection
(series 2/ image 57) extending along the right pelvic floor (series
2/image 60) and measuring 4.8 x 1.5 cm along the right pelvic side
wall (series 2/ image 64). This previously measured 3.0 x 0.9 cm
along the right pelvic side wall. This does not contain contrast on
the current study, although contrast was not yet reached the level
of the surgical anastomosis/rectum.

Musculoskeletal: Degenerative changes of the visualized
thoracolumbar spine.
IMPRESSION: Status post low anterior section with diverting loop ileostomy in
the right mid abdomen.

Again seen is a small presacral fluid collection extending along the
right pelvic side wall, measuring up to 4.8 x 1.5 cm, mildly
increased. Contrast is not present within the collection on the
current study, although contrast has not yet reached the level of
the surgical anastomosis/ rectum.

No evidence of bowel obstruction.

Additional stable ancillary findings as above.

## 2017-06-02 IMAGING — CT CT PELVIS W/O CM
2 of 3 series · 17 of 46 positions shown, 19 images · non-contrast
Comparison: 12/04/2015

CLINICAL DATA: Water soluble rectal contrast, evaluate for
anastomotic stricture

EXAM:
CT PELVIS WITHOUT CONTRAST
TECHNIQUE: Multidetector CT imaging of the pelvis was performed following the
standard protocol without intravenous contrast.

[Series 2: pelvis st · axial · 0.96mm/px · z∈[-321,-26]mm · 14 of 69 slices shown, 16 images]
[im 5/69  soft-tissue]
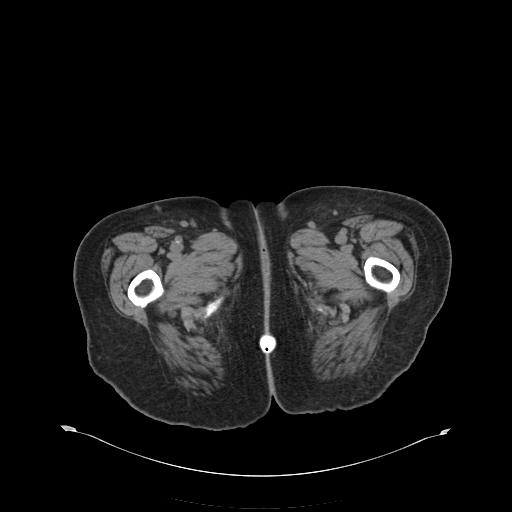
[im 5/69  bone]
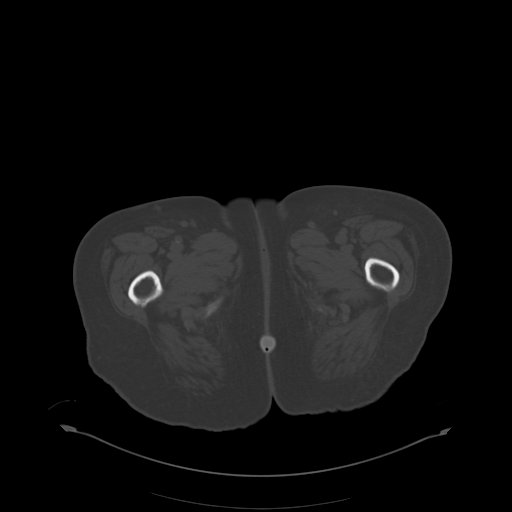
[im 9/69  soft-tissue]
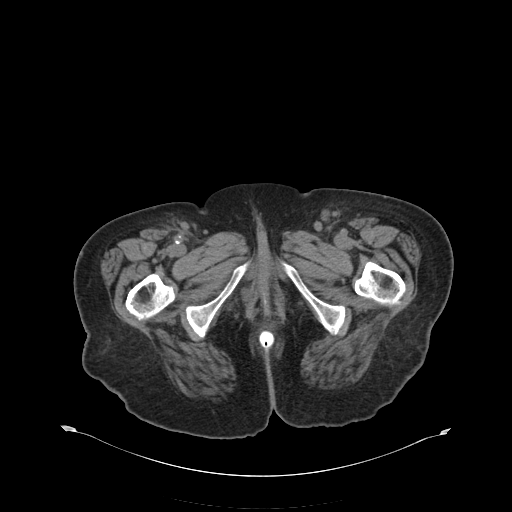
[im 14/69  soft-tissue]
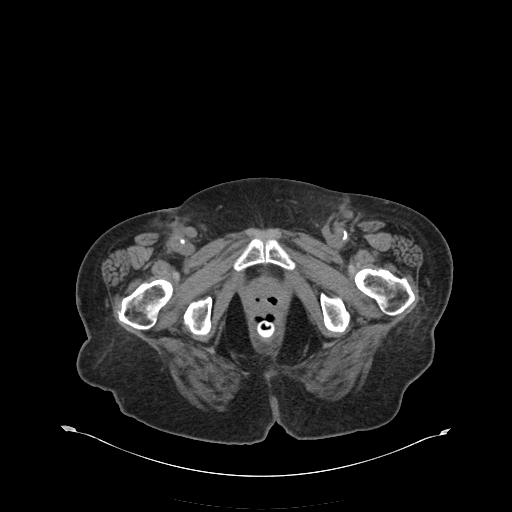
[im 18/69  soft-tissue]
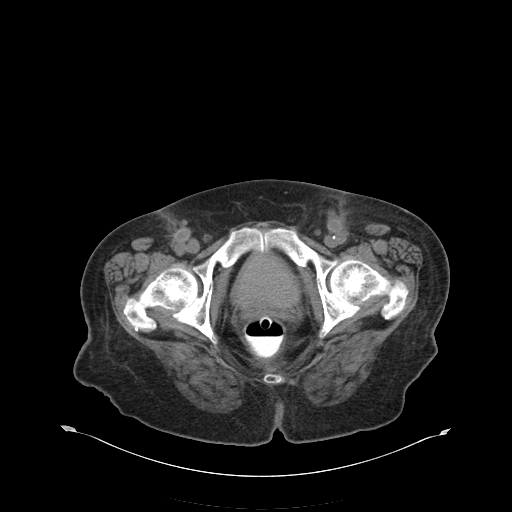
[im 22/69  soft-tissue]
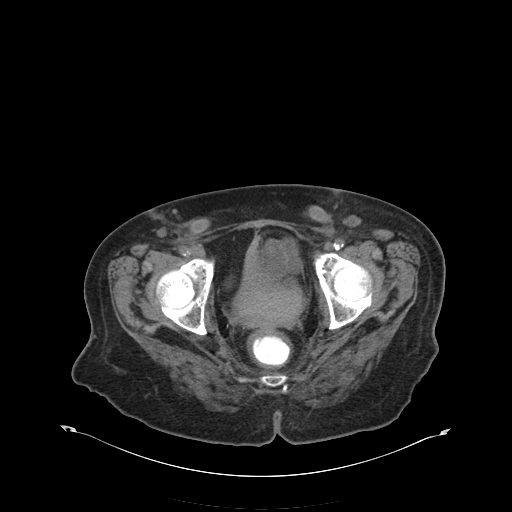
[im 27/69  soft-tissue]
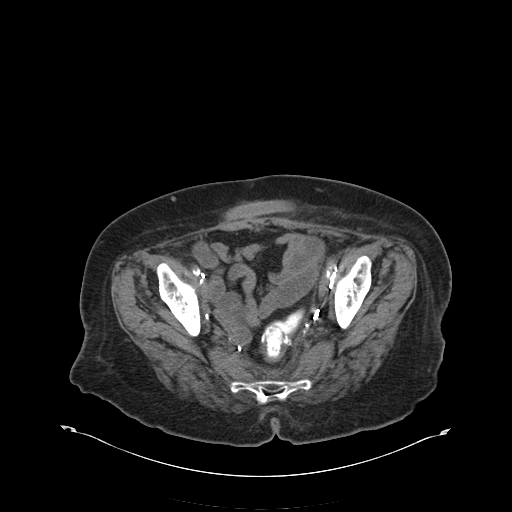
[im 31/69  soft-tissue]
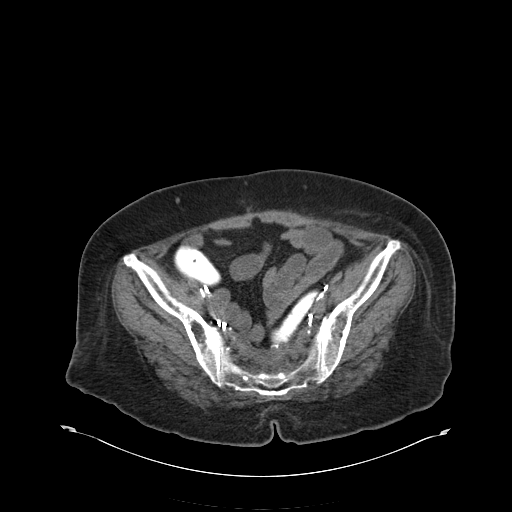
[im 38/69  soft-tissue]
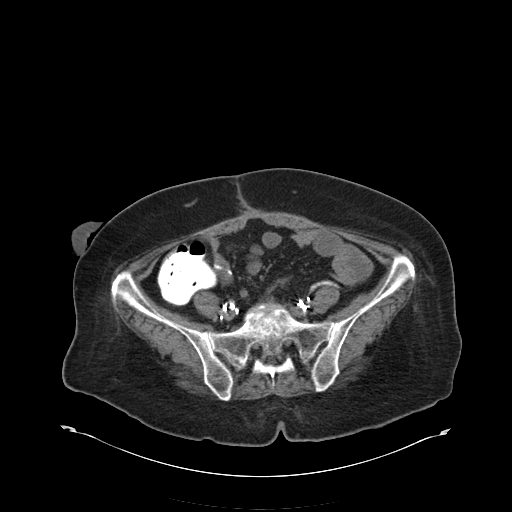
[im 42/69  soft-tissue]
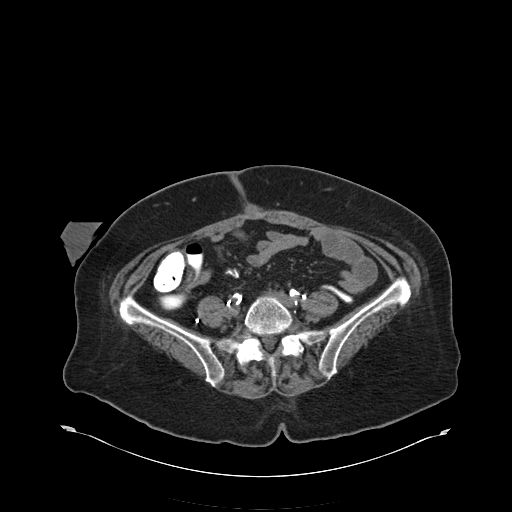
[im 42/69  bone]
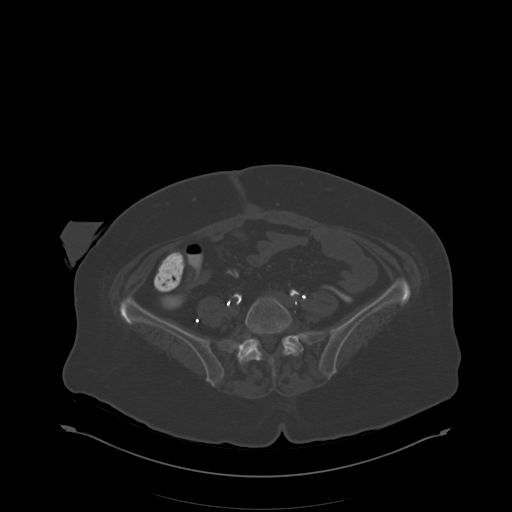
[im 47/69  soft-tissue]
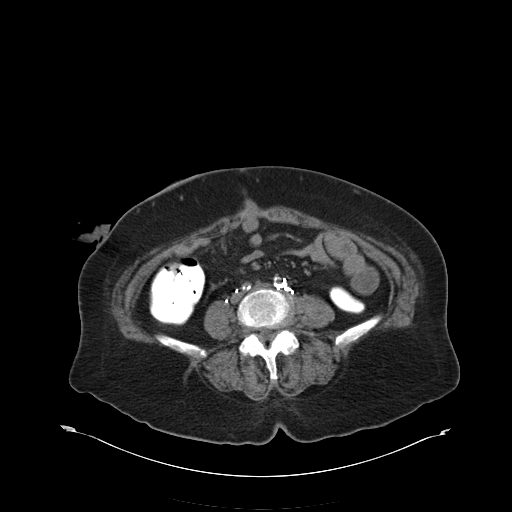
[im 51/69  soft-tissue]
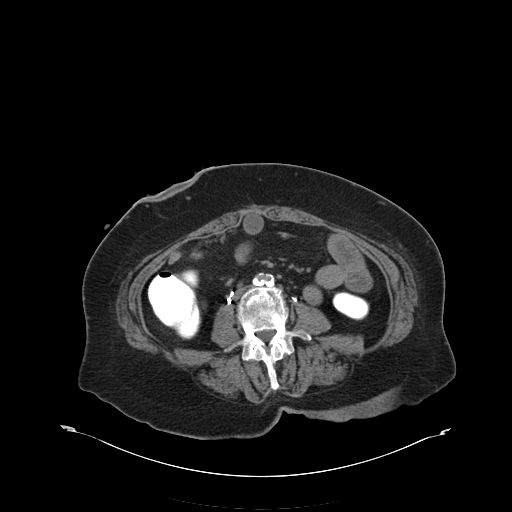
[im 55/69  soft-tissue]
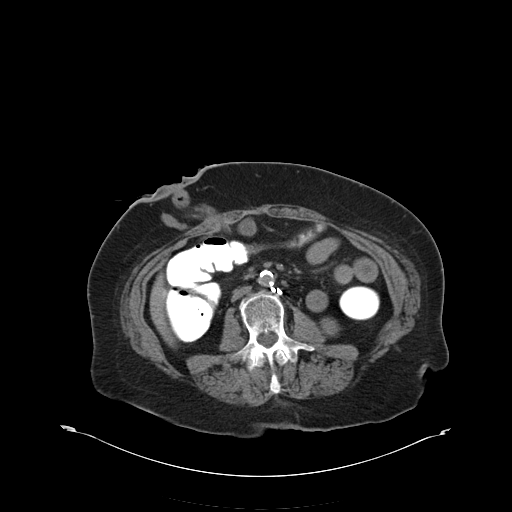
[im 60/69  soft-tissue]
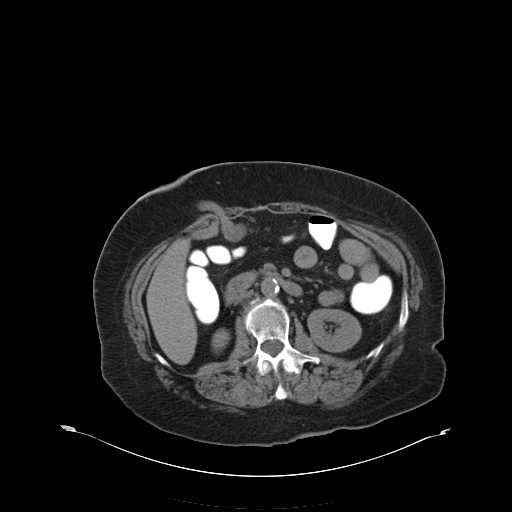
[im 64/69  soft-tissue]
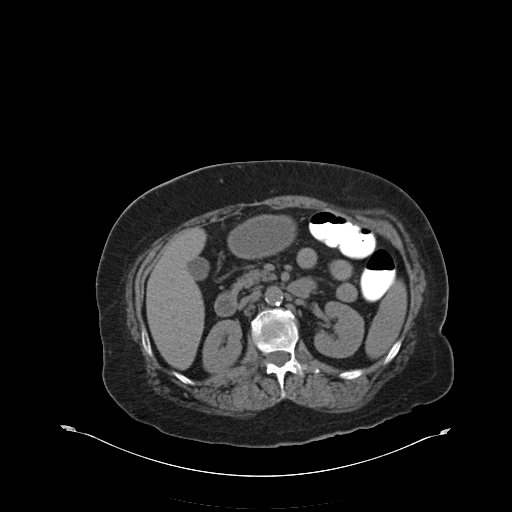

[Series 602: <mpr range> · coronal · 0.96mm/px · 3 of 149 slices shown]
[im 50/149  soft-tissue]
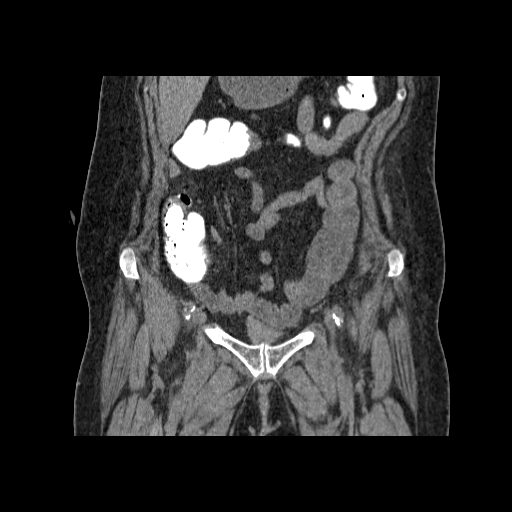
[im 66/149  soft-tissue]
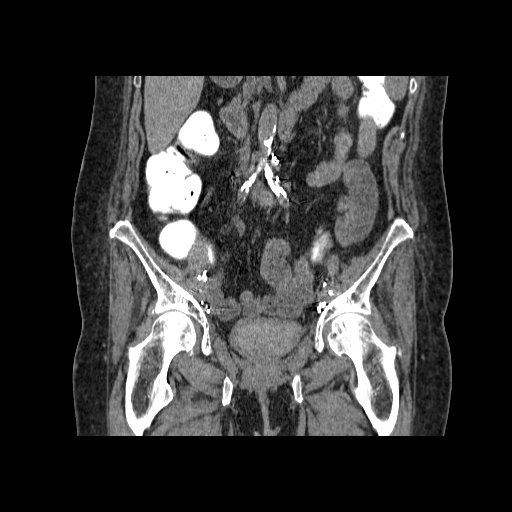
[im 83/149  soft-tissue]
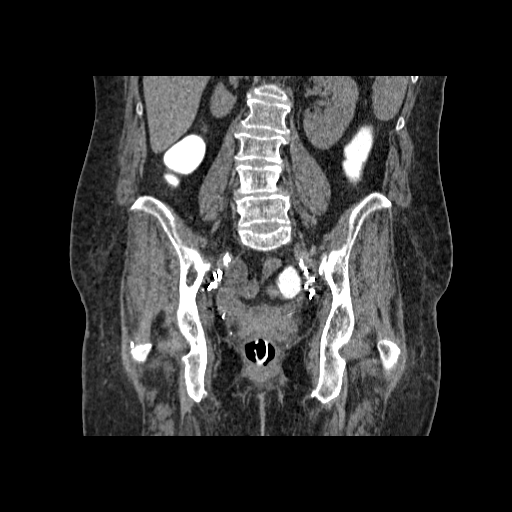

[17 of 46 positions shown; findings below may reference images not displayed]

FINDINGS: Urinary Tract: Excretory within the bladder.

Bowel: Patient is status put low anterior section with anastomosis
in the left lower pelvis (series 2/ image 43). Diverting loop
ileostomy in the right mid abdomen (series 2/ image 10).

Rectal contrast has been administered and opacifies the colon. No
evidence of anastomotic stricture.

Again noted is presacral fluid (series 2/ image 39) with an
associated 1.5 x 4.4 cm fluid collection along the right pelvic side
wall (series 2/ image 43). No contrast within the fluid collection
suggesting anastomotic leak.

Vascular/Lymphatic: Atherosclerotic calcifications of the abdominal
aorta and branch vessels. Diminutive right common iliac artery.
Fem-fem bypass.

No suspicious pelvic lymphadenopathy.

Reproductive: Status post hysterectomy.

No adnexal masses.

Other: No pelvic ascites.

Musculoskeletal: Degenerative changes of the lumbar spine.
IMPRESSION: No evidence of anastomotic stricture or leak.

Otherwise unchanged from recent CT.

## 2017-06-09 IMAGING — DX DG CHEST 1V PORT
1 series · 1 of 1 positions shown · non-contrast
Comparison: 08/23/2015

CLINICAL DATA: PICC line position

EXAM:
PORTABLE CHEST 1 VIEW

[chest ap]
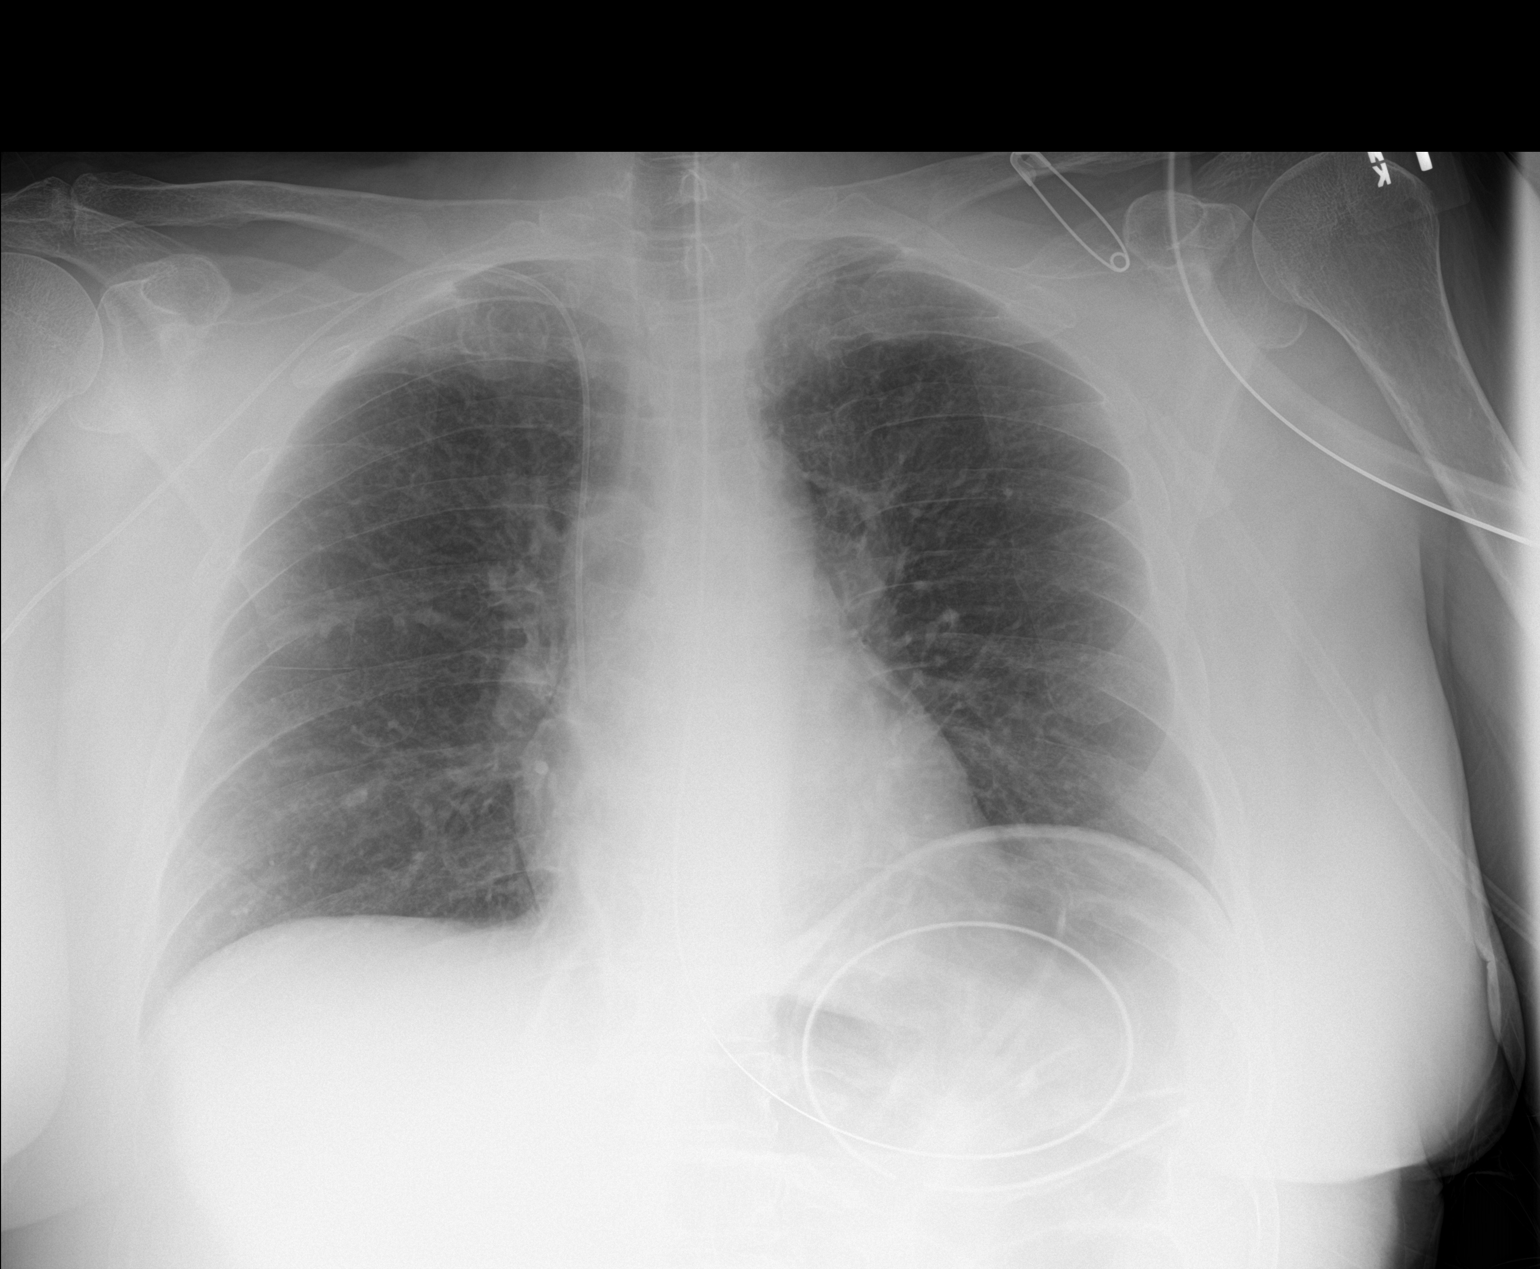

[1 of 1 positions shown; findings below may reference images not displayed]

FINDINGS: Lungs are clear.  No pleural effusion or pneumothorax.

The heart is normal in size.

Right arm PICC terminates at the cavoatrial junction.

Enteric tube is looped the stomach and terminates in the mid gastric
body.
IMPRESSION: Right arm PICC terminates at the cavoatrial junction.

## 2017-09-08 IMAGING — MG DIGITAL SCREENING BILATERAL MAMMOGRAM WITH CAD
6 series · 6 of 6 positions shown · non-contrast
Comparison: Previous exam(s).

ACR Breast Density Category a: The breast tissue is almost entirely
fatty.

CLINICAL DATA: Screening.

EXAM:
DIGITAL SCREENING BILATERAL MAMMOGRAM WITH CAD

[R CC]
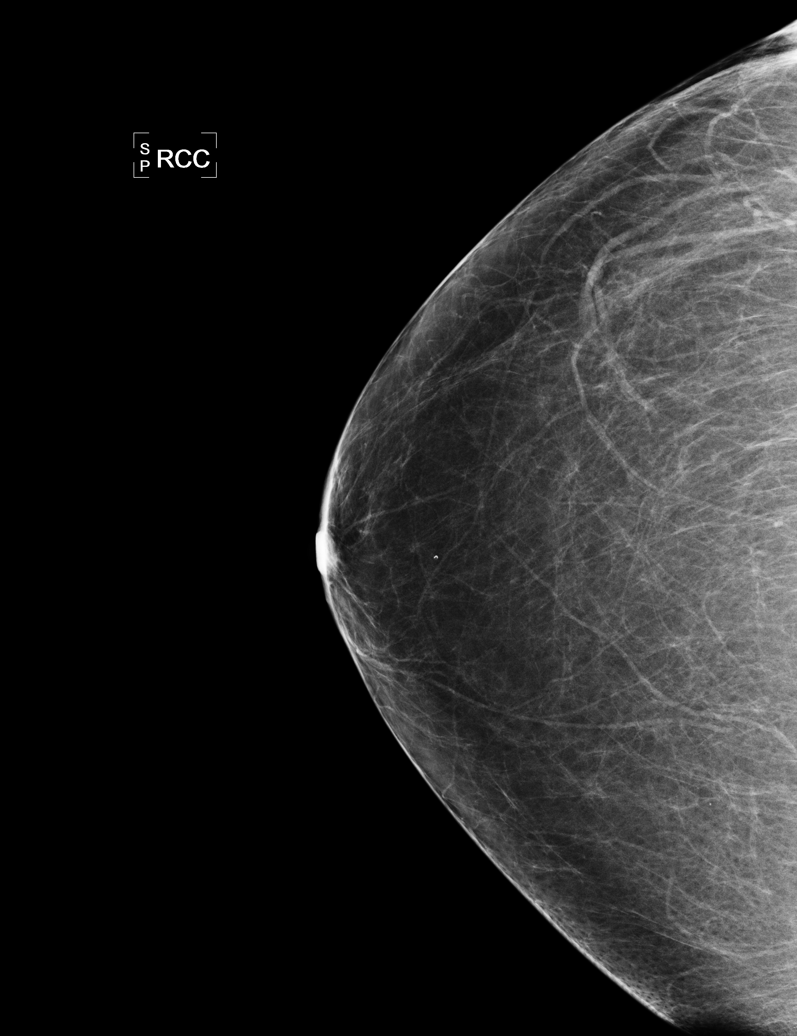

[L CC (1 of 2)]
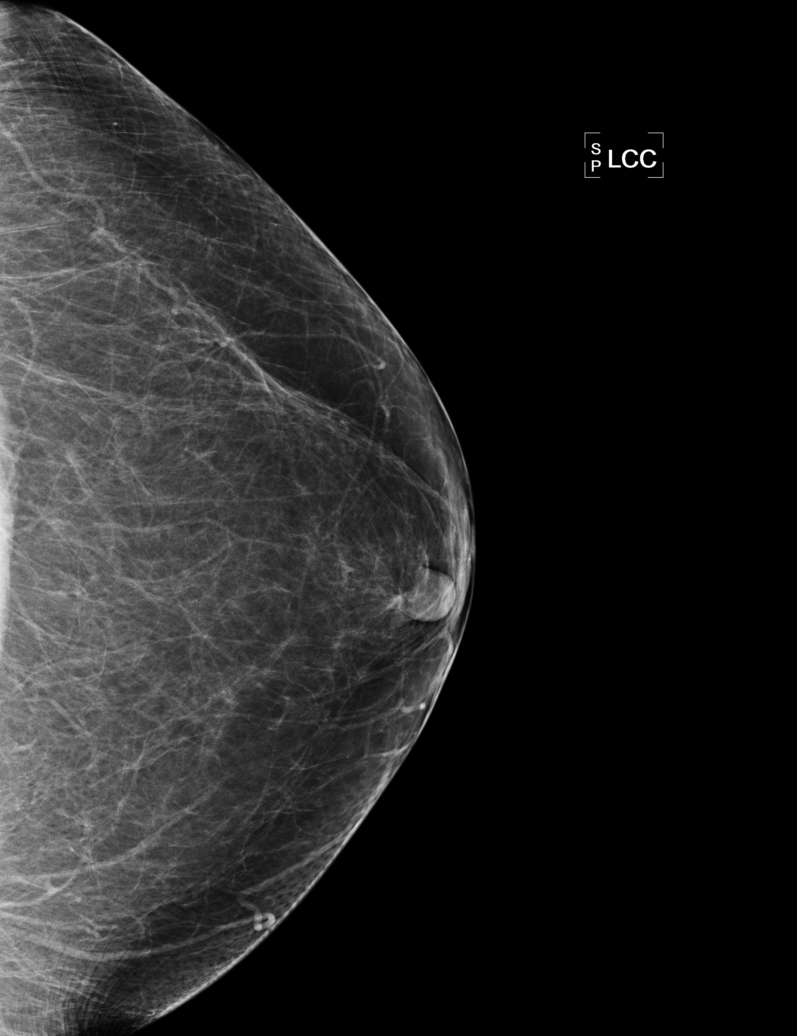

[L MLO]
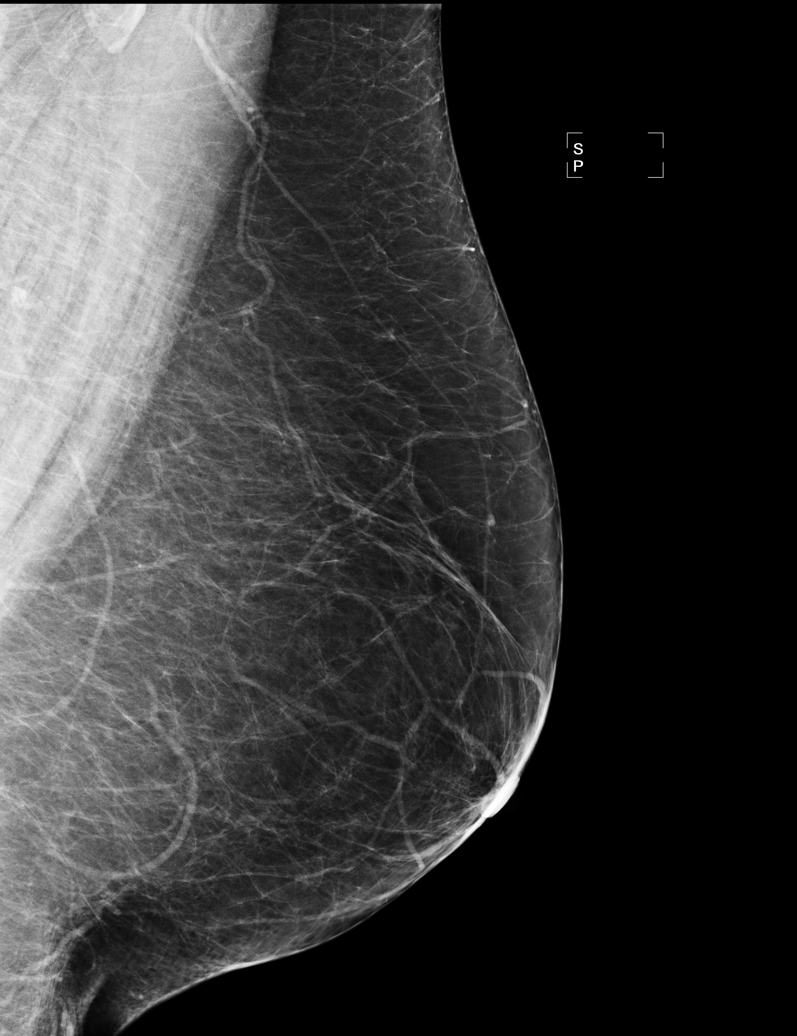

[R MLO]
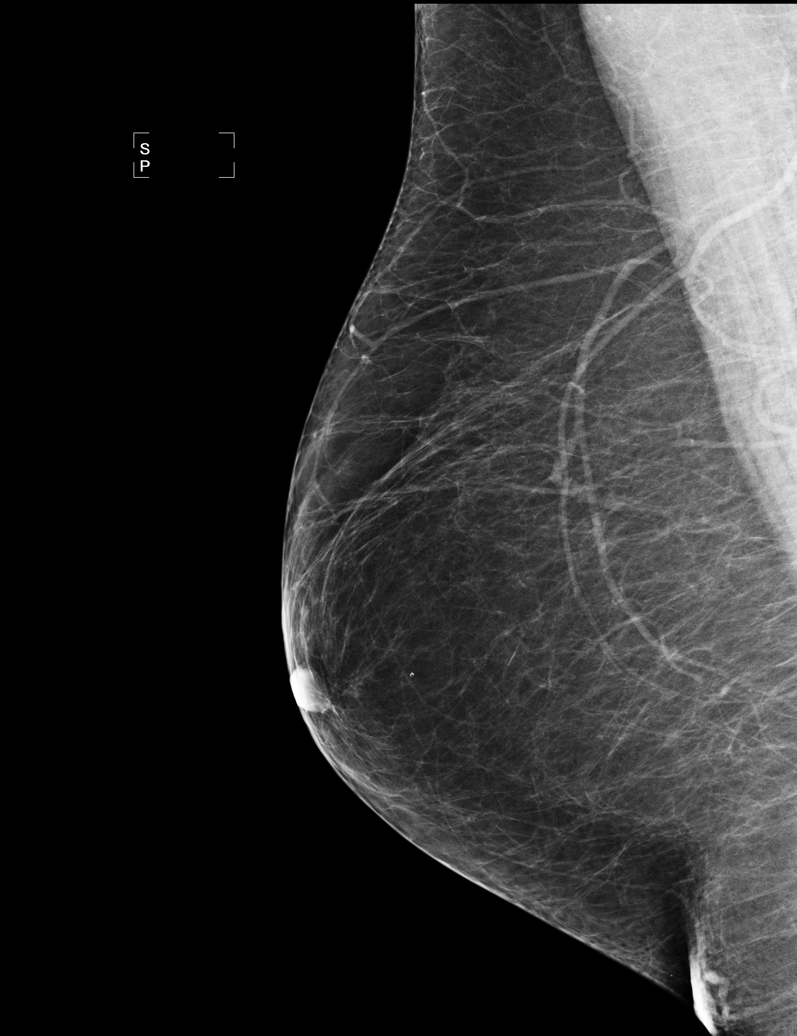

[L CC (2 of 2)]
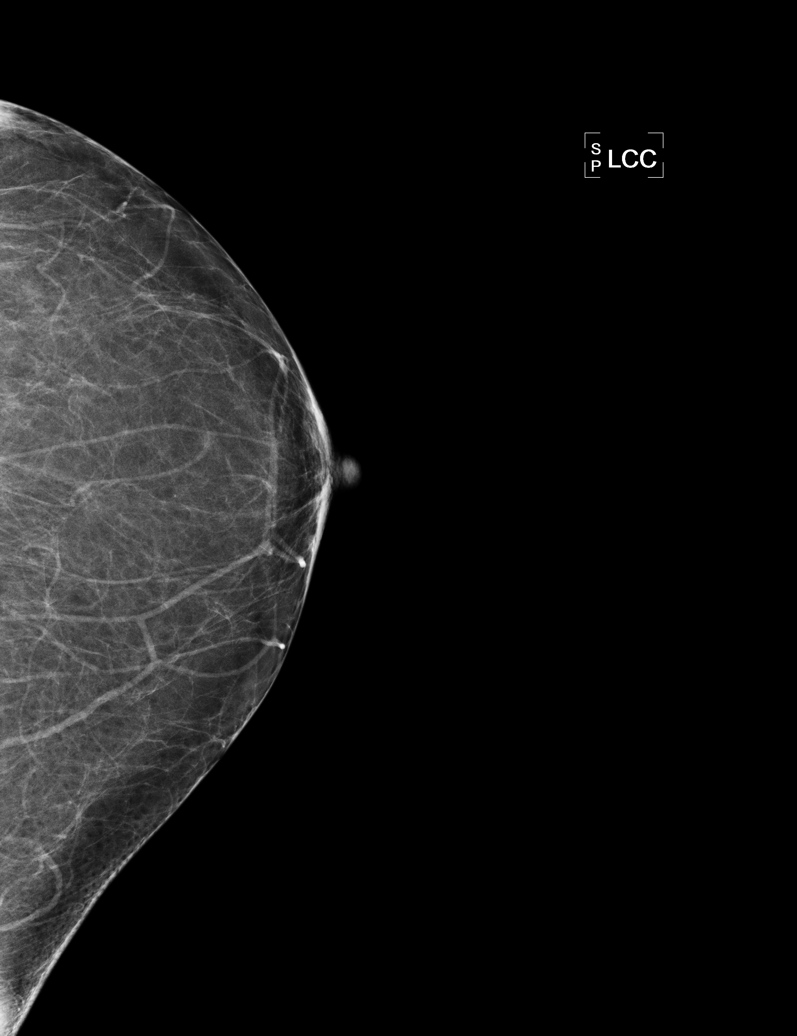

[L CV]
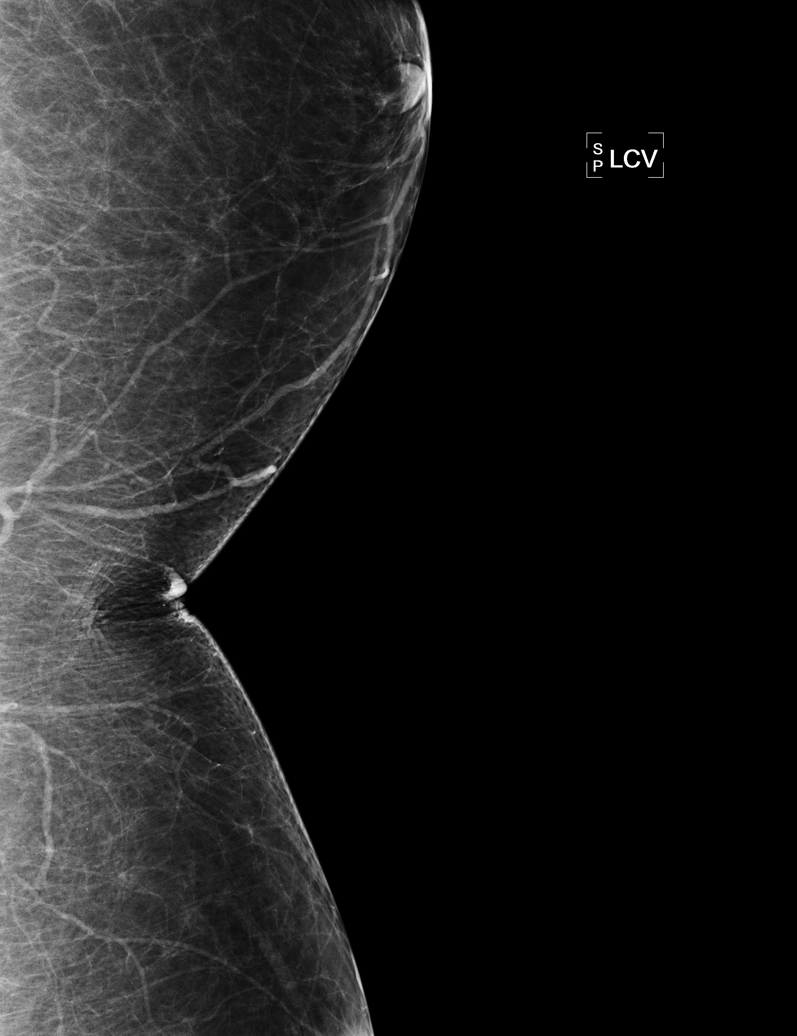

[6 of 6 positions shown; findings below may reference images not displayed]

FINDINGS: There are no findings suspicious for malignancy. Images were
processed with CAD.
IMPRESSION: No mammographic evidence of malignancy. A result letter of this
screening mammogram will be mailed directly to the patient.

RECOMMENDATION:
Screening mammogram in one year. (Code:[id number]-W-[id number])

BI-RADS CATEGORY  1: Negative.

## 2017-10-06 ENCOUNTER — Encounter: Payer: Self-pay | Admitting: Physician Assistant

## 2017-10-06 ENCOUNTER — Ambulatory Visit (INDEPENDENT_AMBULATORY_CARE_PROVIDER_SITE_OTHER): Payer: BLUE CROSS/BLUE SHIELD | Admitting: Physician Assistant

## 2017-10-06 ENCOUNTER — Other Ambulatory Visit: Payer: Self-pay

## 2017-10-06 ENCOUNTER — Ambulatory Visit (HOSPITAL_COMMUNITY)
Admission: RE | Admit: 2017-10-06 | Discharge: 2017-10-06 | Disposition: A | Discharge status: Home / Self Care | Payer: Self-pay | Source: Ambulatory Visit | Attending: Vascular Surgery | Admitting: Vascular Surgery

## 2017-10-06 VITALS — BP 143/80 | HR 72 | Temp 98.8°F | Resp 16 | Ht 62.0 in | Wt 196.0 lb

## 2017-10-06 DIAGNOSIS — I739 Peripheral vascular disease, unspecified: Secondary | ICD-10-CM

## 2017-10-06 DIAGNOSIS — I7410 Embolism and thrombosis of unspecified parts of aorta: Secondary | ICD-10-CM

## 2017-10-06 NOTE — Progress Notes (Signed)
Patient name: Cathy Jones MRN: [id number] DOB: 01/30/1961 Sex: female    HPI: Cathy Jones is a 57 y.o. 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 She presented with severe ischemia right lower extremity and underwent revascularization with femorofemoral bypass. She did have an episode of thrombosis of this after having CT directed drainage of a pelvic abscess. She was lying flat on her abdomen for prolonged period.  She is  s/p thrombectomy of left to right femoral to femoral bypass via right inguinal approach on 09/05/15 & Right iliac thrombectomy, right iliac arteriogram, left to right femorofemoral bypass with 8 mm Hemashield graft by Dr. Donnetta Hutching on 08/10/15.  .  She does continue to have numbness in her right foot, which will most likely be her new baseline at this point.   She denise symptoms of claudication, rest pain and non healing wounds.  She stays farley active with her grand kids.    She is not on any prescription medications.  She denise CAD, MI, stroke, and DM.  She does smoke at least a pack a day.        Past Medical History:  Diagnosis Date  . Acute renal failure (ARF) ([place]) 08/07/2015  . Anemia   . Asthma    pt brought her proair inhaler 06-06-14  . Cervical cancer ([place])   . Colon polyp    hyperplastic  . Colon stricture ([place])   . Colonic obstruction ([place])   . Depression   . Family history of adverse reaction to anesthesia    pts mother experiences nausea and vomiting   . GERD (gastroesophageal reflux disease)   . History of bronchitis   . History of chemotherapy   . History of hiatal hernia   . History of radiation therapy   . Hyperlipidemia   . Hypertension   . 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 Migraine    "q couple years maybe" (08/07/2015)  . Shortness of breath dyspnea    seasonal     Past Surgical History:  Procedure Laterality Date  . ABDOMINAL HYSTERECTOMY  1994  . COLON RESECTION N/A 06/27/2015   Procedure: LYSIS OF ADHESIONS LOW ANTERIOR RESECTION DIVERTING LOOP ILEOSTOMY;  Surgeon: Leighton Ruff, MD;  Location: [facility];  Service: General;  Laterality: N/A;  . COLON SURGERY    . COLONOSCOPY    . CYSTOSCOPY WITH STENT PLACEMENT Bilateral 06/27/2015   Procedure: CYSTOSCOPY WITH CATHETER  PLACEMENT;  Surgeon: Franchot Gallo, MD;  Location: [facility];  Service: Urology;  Laterality: Bilateral;  . DIVERTING ILEOSTOMY N/A 06/27/2015   Procedure: DIVERTING LOOP ILEOSTOMY;  Surgeon: Leighton Ruff, MD;  Location: [facility];  Service: General;  Laterality: N/A;  . FEMORAL-FEMORAL BYPASS GRAFT Bilateral 08/10/2015   Procedure: BYPASS GRAFT LEFT FEMORALTO RIGHT FEMORAL ARTERY;  Surgeon: Rosetta Posner, MD;  Location: [facility];  Service: Vascular;  Laterality: Bilateral;  . FLEXIBLE SIGMOIDOSCOPY  12/12/2015   Procedure: FLEXIBLE SIGMOIDOSCOPY;  Surgeon: Leighton Ruff, MD;  Location: [facility];  Service: General;;  . ILEOSTOMY CLOSURE N/A 12/12/2015   Procedure: OPEN ILEOSTOMY REVERSAL AND PELVIC WASHOUT;  Surgeon: Leighton Ruff, MD;  Location: [facility];  Service: General;  Laterality: N/A;  . INTRAOPERATIVE ARTERIOGRAM Right 08/10/2015   Procedure: INTRA OPERATIVE ARTERIOGRAM Right Iliac Artery;  Surgeon: Rosetta Posner, MD;  Location:  [facility];  Service: Vascular;  Laterality: Right;  . IR GENERIC HISTORICAL  09/12/2015   IR RADIOLOGIST EVAL & MGMT 09/12/2015 Monia Sabal, PA-C [facility] INTERV RAD  . IV antibiotic infusion therapy     . PERIPHERAL VASCULAR CATHETERIZATION N/A 08/09/2015   Procedure: Abdominal Aortogram w/Lower Extremity;  Surgeon: Conrad [place], MD;  Location: [facility];  Service: Cardiovascular;  Laterality: N/A;  . PERIPHERAL VASCULAR CATHETERIZATION Left 08/09/2015   Procedure: Peripheral Vascular Intervention;  Surgeon: Conrad Carl[place], MD;  Location: [facility];  Service:  Cardiovascular;  Laterality: Left;  common iliac  . PICC LINE PLACE PERIPHERAL ([place] [facility])    . THROMBECTOMY FEMORAL ARTERY Bilateral 09/05/2015   Procedure: THROMBECTOMY Left to Right Femoral to  FEMORAL ARTERY Bypass,.;  Surgeon: Mal Misty, MD;  Location: [facility];  Service: Vascular;  Laterality: Bilateral;  . THROMBECTOMY ILIAC ARTERY Right 08/10/2015   Procedure: THROMBECTOMY Right ILIAC ARTERY;  Surgeon: Rosetta Posner, MD;  Location: [facility];  Service: Vascular;  Laterality: Right;  . TUBAL LIGATION    . UPPER GASTROINTESTINAL ENDOSCOPY      Family History  Problem Relation Age of Onset  . Rectal cancer Cousin   . Hypertension Mother   . Hyperparathyroidism Mother   . Hypertension Father   . CAD Father        CABG  . Peripheral vascular disease Father   . Graves' disease Father   . Colon cancer Neg Hx   . Stomach cancer Neg Hx   . Esophageal cancer Neg Hx   . Deep vein thrombosis Neg Hx     SOCIAL HISTORY: Social History   Socioeconomic History  . Marital status: Married    Spouse name: Not on file  . Number of children: 1  . Years of education: Not on file  . Highest education level: Not on file  Occupational History  . Not on file  Social Needs  . Financial resource strain: Not on file  . Food insecurity:    Worry: Not on file    Inability: Not on file  . Transportation needs:    Medical: Not on file    Non-medical: Not on file  Tobacco Use  . Smoking status: Current Every Day Smoker    Packs/day: 1.50    Years: 30.00    Pack years: 45.00    Types: Cigarettes  . Smokeless tobacco: Never Used  . Tobacco comment: Less than 10 cigarettes per day  Substance and Sexual Activity  . Alcohol use: No    Alcohol/week: 0.0 oz  . Drug use: No  . Sexual activity: Yes  Lifestyle  . Physical activity:    Days per week: Not on file    Minutes per session: Not on file  . Stress: Not on file  Relationships  . Social connections:    Talks on phone: Not on file     Gets together: Not on file    Attends religious service: Not on file    Active member of club or organization: Not on file    Attends meetings of clubs or organizations: Not on file    Relationship status: Not on file  . Intimate partner violence:    Fear of current or ex partner: Not on file    Emotionally abused: Not on file    Physically abused: Not on file    Forced sexual activity: Not on file  Other Topics Concern  . Not on  file  Social History Narrative  . Not on file    Allergies  Allergen Reactions  . Codeine Other (See Comments)    "hyper"    Current Outpatient Medications  Medication Sig Dispense Refill  . Probiotic Product (PROBIOTIC PO) Take 1 tablet by mouth daily.     No current facility-administered medications for this visit.     ROS:   General:  No weight loss, Fever, chills  HEENT: No recent headaches, no nasal bleeding, no visual changes, no sore throat  Neurologic: No dizziness, blackouts, seizures. No recent symptoms of stroke or mini- stroke. No recent episodes of slurred speech, or temporary blindness.  Cardiac: No recent episodes of chest pain/pressure, no shortness of breath at rest.  No shortness of breath with exertion.  Denies history of atrial fibrillation or irregular heartbeat  Vascular: No history of rest pain in feet.  No history of claudication.  No history of non-healing ulcer, No history of DVT   Pulmonary: No home oxygen, no productive cough, no hemoptysis,  No asthma or wheezing  Musculoskeletal:  [ ]  Arthritis, [ ]  Low back pain,  [ ]  Joint pain  Hematologic:No history of hypercoagulable state.  No history of easy bleeding.  No history of anemia  Gastrointestinal: No hematochezia or melena,  No gastroesophageal reflux, no trouble swallowing  Urinary: [ ]  chronic Kidney disease, [ ]  on HD - [ ]  MWF or [ ]  TTHS, [ ]  Burning with urination, [ ]  Frequent urination, [ ]  Difficulty urinating;   Skin: No rashes  Psychological: No  history of anxiety,  No history of depression   Physical Examination  Vitals:   10/06/17 1450  BP: (!) 143/80  Pulse: 72  Resp: 16  Temp: 98.8 F (37.1 C)  TempSrc: Oral  SpO2: 95%  Weight: 196 lb (88.9 kg)  Height: 5\' 2"  (1.575 m)    Body mass index is 35.85 kg/m.  General:  Alert and oriented, no acute distress HEENT: Normal Neck: No bruit or JVD Pulmonary: Clear to auscultation right lung fields, decreased breath sound left lung fields. Cardiac: Regular Rate and Rhythm without murmur Abdomen: Soft, non-tender, non-distended, no mass, well healed scars, abdominal and B groins. Skin: No rash Extremity Pulses:  2+ radial, brachial, doppler femoral-femoral bypass, dorsalis pedis, posterior tibial signals bilaterally Musculoskeletal: No deformity or edema  Neurologic: Upper and lower extremity motor 5/5 and symmetric  DATA:  ABI Right 0.43 previous 0.69 Left 0.46 previous 0.75  ASSESSMENT:   57 y.o. female s/p thrombectomy of left to right femoral to femoral bypass via right inguinal approach on 09/05/15 & Right iliac thrombectomy, right iliac arteriogram, left to right femorofemoral bypass with 8 mm Hemashield graft by Dr. Donnetta Hutching on 08/10/15     PLAN:   She denise symptoms of claudication and rest pain.   She denise history of non healing wounds.  She does have occasional pain that shoots down her right LE from the hip to the foot, but she states it only happens once a month.    At this time she remains asymptomatic.  We will schedule her for repeat studies in 1 year ABI's and fem-fem bypass duplex.  We reviewed the symptoms of arterial insufficiency and she stated she verbally understood.  If she symptoms she will call sooner.   Roxy Horseman PA-C [facility]  MD in clinic today [place]

## 2018-06-07 ENCOUNTER — Emergency Department (HOSPITAL_COMMUNITY): Payer: Self-pay

## 2018-06-07 ENCOUNTER — Encounter (HOSPITAL_COMMUNITY): Payer: Self-pay

## 2018-06-07 ENCOUNTER — Inpatient Hospital Stay (HOSPITAL_COMMUNITY)
Admission: EM | Admit: 2018-06-07 | Discharge: 2018-06-11 | DRG: 759 | Disposition: A | Discharge status: Home / Self Care | Payer: Self-pay | Attending: Internal Medicine | Admitting: Internal Medicine

## 2018-06-07 ENCOUNTER — Other Ambulatory Visit: Payer: Self-pay

## 2018-06-07 DIAGNOSIS — Z720 Tobacco use: Secondary | ICD-10-CM | POA: Diagnosis present

## 2018-06-07 DIAGNOSIS — D519 Vitamin B12 deficiency anemia, unspecified: Secondary | ICD-10-CM | POA: Diagnosis present

## 2018-06-07 DIAGNOSIS — N738 Other specified female pelvic inflammatory diseases: Principal | ICD-10-CM | POA: Diagnosis present

## 2018-06-07 DIAGNOSIS — K219 Gastro-esophageal reflux disease without esophagitis: Secondary | ICD-10-CM | POA: Diagnosis present

## 2018-06-07 DIAGNOSIS — D539 Nutritional anemia, unspecified: Secondary | ICD-10-CM | POA: Diagnosis present

## 2018-06-07 DIAGNOSIS — K5900 Constipation, unspecified: Secondary | ICD-10-CM | POA: Diagnosis present

## 2018-06-07 DIAGNOSIS — Z9221 Personal history of antineoplastic chemotherapy: Secondary | ICD-10-CM

## 2018-06-07 DIAGNOSIS — K624 Stenosis of anus and rectum: Secondary | ICD-10-CM | POA: Diagnosis present

## 2018-06-07 DIAGNOSIS — Z90710 Acquired absence of both cervix and uterus: Secondary | ICD-10-CM

## 2018-06-07 DIAGNOSIS — B9620 Unspecified Escherichia coli [E. coli] as the cause of diseases classified elsewhere: Secondary | ICD-10-CM | POA: Diagnosis present

## 2018-06-07 DIAGNOSIS — E785 Hyperlipidemia, unspecified: Secondary | ICD-10-CM | POA: Diagnosis present

## 2018-06-07 DIAGNOSIS — I739 Peripheral vascular disease, unspecified: Secondary | ICD-10-CM | POA: Diagnosis present

## 2018-06-07 DIAGNOSIS — F17210 Nicotine dependence, cigarettes, uncomplicated: Secondary | ICD-10-CM | POA: Diagnosis present

## 2018-06-07 DIAGNOSIS — N739 Female pelvic inflammatory disease, unspecified: Secondary | ICD-10-CM | POA: Diagnosis present

## 2018-06-07 DIAGNOSIS — R1084 Generalized abdominal pain: Secondary | ICD-10-CM

## 2018-06-07 DIAGNOSIS — I10 Essential (primary) hypertension: Secondary | ICD-10-CM | POA: Diagnosis present

## 2018-06-07 DIAGNOSIS — Z8541 Personal history of malignant neoplasm of cervix uteri: Secondary | ICD-10-CM

## 2018-06-07 DIAGNOSIS — Z923 Personal history of irradiation: Secondary | ICD-10-CM

## 2018-06-07 DIAGNOSIS — R188 Other ascites: Secondary | ICD-10-CM

## 2018-06-07 DIAGNOSIS — J45909 Unspecified asthma, uncomplicated: Secondary | ICD-10-CM | POA: Diagnosis present

## 2018-06-07 DIAGNOSIS — D529 Folate deficiency anemia, unspecified: Secondary | ICD-10-CM | POA: Diagnosis present

## 2018-06-07 LAB — CBC
HEMATOCRIT: 46.3 % — AB (ref 36.0–46.0)
Hemoglobin: 14.1 g/dL (ref 12.0–15.0)
MCH: 26.8 pg (ref 26.0–34.0)
MCHC: 30.5 g/dL (ref 30.0–36.0)
MCV: 87.9 fL (ref 80.0–100.0)
NRBC: 0 % (ref 0.0–0.2)
Platelets: 194 10*3/uL (ref 150–400)
RBC: 5.27 MIL/uL — AB (ref 3.87–5.11)
RDW: 13.7 % (ref 11.5–15.5)
WBC: 14.5 10*3/uL — ABNORMAL HIGH (ref 4.0–10.5)

## 2018-06-07 LAB — COMPREHENSIVE METABOLIC PANEL
ALT: 9 U/L (ref 0–44)
AST: 13 U/L — ABNORMAL LOW (ref 15–41)
Albumin: 3.8 g/dL (ref 3.5–5.0)
Alkaline Phosphatase: 96 U/L (ref 38–126)
Anion gap: 11 (ref 5–15)
BUN: 14 mg/dL (ref 6–20)
CHLORIDE: 105 mmol/L (ref 98–111)
CO2: 22 mmol/L (ref 22–32)
CREATININE: 0.77 mg/dL (ref 0.44–1.00)
Calcium: 9.1 mg/dL (ref 8.9–10.3)
GFR calc non Af Amer: >60 mL/min (ref >60)
Glucose, Bld: 119 mg/dL — ABNORMAL HIGH (ref 70–99)
Potassium: 4.2 mmol/L (ref 3.5–5.1)
SODIUM: 138 mmol/L (ref 135–145)
Total Bilirubin: 0.5 mg/dL (ref 0.3–1.2)
Total Protein: 6.9 g/dL (ref 6.5–8.1)

## 2018-06-07 LAB — URINALYSIS, ROUTINE W REFLEX MICROSCOPIC
Bilirubin Urine: NEGATIVE
GLUCOSE, UA: NEGATIVE mg/dL
Hgb urine dipstick: NEGATIVE
Ketones, ur: NEGATIVE mg/dL
LEUKOCYTES UA: NEGATIVE
Nitrite: NEGATIVE
PH: 5 (ref 5.0–8.0)
Protein, ur: NEGATIVE mg/dL
Specific Gravity, Urine: 1.006 (ref 1.005–1.030)

## 2018-06-07 LAB — LIPASE, BLOOD: LIPASE: 28 U/L (ref 11–51)

## 2018-06-07 LAB — PROTIME-INR
INR: 1.02
PROTHROMBIN TIME: 13.3 s (ref 11.4–15.2)

## 2018-06-07 MED ORDER — SODIUM CHLORIDE 0.9% FLUSH: 3.0000 mL | INTRAVENOUS | Status: DC | PRN

## 2018-06-07 MED ORDER — ONDANSETRON HCL 4 MG PO TABS: 4.0000 mg | ORAL_TABLET | Freq: Four times a day (QID) | ORAL | Status: DC | PRN

## 2018-06-07 MED ORDER — PIPERACILLIN-TAZOBACTAM 3.375 G IVPB
3.3750 g | Freq: Three times a day (TID) | INTRAVENOUS | Status: DC
  Administered 2018-06-07 – 2018-06-10 (×9): 3.375 g via INTRAVENOUS
  Filled 2018-06-07 (×7): qty 50

## 2018-06-07 MED ORDER — MORPHINE SULFATE (PF) 2 MG/ML IV SOLN
2.0000 mg | INTRAVENOUS | Status: AC | PRN
  Administered 2018-06-08: 2 mg via INTRAVENOUS
  Filled 2018-06-07: qty 1

## 2018-06-07 MED ORDER — ACETAMINOPHEN 325 MG PO TABS: 650.0000 mg | ORAL_TABLET | Freq: Four times a day (QID) | ORAL | Status: DC | PRN

## 2018-06-07 MED ORDER — ONDANSETRON HCL 4 MG/2ML IJ SOLN
4.0000 mg | Freq: Once | INTRAMUSCULAR | Status: AC
  Administered 2018-06-07: 4 mg via INTRAVENOUS
  Filled 2018-06-07: qty 2

## 2018-06-07 MED ORDER — SENNOSIDES-DOCUSATE SODIUM 8.6-50 MG PO TABS
2.0000 | ORAL_TABLET | Freq: Two times a day (BID) | ORAL | Status: DC
  Administered 2018-06-08 – 2018-06-11 (×7): 2 via ORAL
  Filled 2018-06-07 (×11): qty 2

## 2018-06-07 MED ORDER — DEXTROSE-NACL 5-0.45 % IV SOLN
INTRAVENOUS | Status: DC
  Administered 2018-06-07 – 2018-06-08 (×3): via INTRAVENOUS

## 2018-06-07 MED ORDER — ACETAMINOPHEN 650 MG RE SUPP: 650.0000 mg | Freq: Four times a day (QID) | RECTAL | Status: DC | PRN

## 2018-06-07 MED ORDER — ALBUTEROL SULFATE (2.5 MG/3ML) 0.083% IN NEBU: 2.5000 mg | INHALATION_SOLUTION | RESPIRATORY_TRACT | Status: DC | PRN

## 2018-06-07 MED ORDER — OXYCODONE HCL 5 MG PO TABS
5.0000 mg | ORAL_TABLET | ORAL | Status: DC | PRN
  Administered 2018-06-07 – 2018-06-10 (×6): 5 mg via ORAL
  Filled 2018-06-07 (×6): qty 1

## 2018-06-07 MED ORDER — ONDANSETRON HCL 4 MG/2ML IJ SOLN: 4.0000 mg | Freq: Four times a day (QID) | INTRAMUSCULAR | Status: DC | PRN

## 2018-06-07 MED ORDER — SODIUM CHLORIDE 0.9% FLUSH
3.0000 mL | Freq: Two times a day (BID) | INTRAVENOUS | Status: DC
  Administered 2018-06-07 – 2018-06-11 (×3): 3 mL via INTRAVENOUS

## 2018-06-07 MED ORDER — BISACODYL 10 MG RE SUPP
10.0000 mg | Freq: Once | RECTAL | Status: DC
  Filled 2018-06-07: qty 1

## 2018-06-07 MED ORDER — POLYETHYLENE GLYCOL 3350 17 G PO PACK
17.0000 g | PACK | Freq: Once | ORAL | Status: AC
  Administered 2018-06-07: 17 g via ORAL
  Filled 2018-06-07: qty 1

## 2018-06-07 MED ORDER — IOPAMIDOL (ISOVUE-300) INJECTION 61%
100.0000 mL | Freq: Once | INTRAVENOUS | Status: AC | PRN
  Administered 2018-06-07: 100 mL via INTRAVENOUS

## 2018-06-07 MED ORDER — TRAZODONE HCL 50 MG PO TABS
50.0000 mg | ORAL_TABLET | Freq: Every evening | ORAL | Status: DC | PRN
  Administered 2018-06-07 – 2018-06-10 (×2): 50 mg via ORAL
  Filled 2018-06-07 (×2): qty 1

## 2018-06-07 MED ORDER — SODIUM CHLORIDE 0.9 % IV SOLN: 250.0000 mL | INTRAVENOUS | Status: DC | PRN

## 2018-06-07 MED ORDER — PIPERACILLIN-TAZOBACTAM 3.375 G IVPB 30 MIN
3.3750 g | Freq: Once | INTRAVENOUS | Status: AC
  Administered 2018-06-07: 3.375 g via INTRAVENOUS
  Filled 2018-06-07: qty 50

## 2018-06-07 MED ORDER — SODIUM CHLORIDE 0.9 % IV BOLUS
1000.0000 mL | Freq: Once | INTRAVENOUS | Status: AC
  Administered 2018-06-07: 1000 mL via INTRAVENOUS

## 2018-06-07 MED ORDER — POLYETHYLENE GLYCOL 3350 17 G PO PACK: 17.0000 g | PACK | Freq: Every day | ORAL | Status: DC | PRN

## 2018-06-07 NOTE — ED Provider Notes (Signed)
Emergency Department Provider Note   I have reviewed the triage vital signs and the nursing notes.   HISTORY  Chief Complaint Abdominal Pain   HPI Cathy Jones is a 58 y.o. female with PMH of asthma, colon stricture, GERD, HTN, and HLD presents to the emergency department with abdominal discomfort and concern for possible obstruction.  Patient has had small bowel obstructions in the past which feels similar.  She has not had a bowel movement for the past 3 days and is not passing flatus.  She states she did pass a small, hard ball of stool this morning with no relief.  She has associated nausea and vomiting but no blood in the emesis.  She has been trying to drink fluids to stay hydrated but has not been very successful.  Patient has history of ileostomy which was reversed in 2017 with Dr. Marcello Moores.   Past Medical History:  Diagnosis Date  . Acute renal failure (ARF) ([place]) 08/07/2015  . Anemia   . Asthma    pt brought her proair inhaler 06-06-14  . Cervical cancer ([place])   . Colon polyp    hyperplastic  . Colon stricture ([place])   . Colonic obstruction ([place])   . Depression   . Family history of adverse reaction to anesthesia    pts mother experiences nausea and vomiting   . GERD (gastroesophageal reflux disease)   . History of bronchitis   . History of chemotherapy   . History of hiatal hernia   . History of radiation therapy   . Hyperlipidemia   . Hypertension   . 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 Migraine    "q couple years maybe" (08/07/2015)  . Shortness of breath dyspnea    seasonal     Patient Active Problem List   Diagnosis Date Noted  . Pelvic abscess in female 06/07/2018  . PAD (peripheral artery disease) ([place]) 08/10/2015  . B12 deficiency anemia 08/02/2015  . Folic acid deficiency anemia 08/02/2015  . Hyperlipidemia 07/30/2015  . Depression 07/30/2015  . Ileostomy in place [facility]) 07/30/2015  . 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 ABDOMINAL HYSTERECTOMY  1994  . COLON RESECTION N/A 06/27/2015   Procedure: LYSIS OF ADHESIONS LOW ANTERIOR RESECTION DIVERTING LOOP ILEOSTOMY;  Surgeon: Leighton Ruff, MD;  Location: [facility];  Service: General;  Laterality: N/A;  . COLON SURGERY    . COLONOSCOPY    . CYSTOSCOPY WITH STENT PLACEMENT Bilateral 06/27/2015   Procedure: CYSTOSCOPY WITH CATHETER  PLACEMENT;  Surgeon: Franchot Gallo, MD;  [facility]: [facility];  Service: Urology;  Laterality: Bilateral;  . DIVERTING ILEOSTOMY N/A 06/27/2015   Procedure: DIVERTING LOOP ILEOSTOMY;  Surgeon: Leighton Ruff, MD;  [facility];  Service: General;  Laterality: N/A;  . FEMORAL-FEMORAL BYPASS GRAFT Bilateral 08/10/2015   Procedure: BYPASS GRAFT LEFT FEMORALTO RIGHT FEMORAL ARTERY;  Surgeon: Rosetta Posner, MD;  Location: [place];  Service: Vascular;  Laterality: Bilateral;  . FLEXIBLE SIGMOIDOSCOPY  12/12/2015   Procedure: FLEXIBLE SIGMOIDOSCOPY;  Surgeon: Leighton Ruff, MD;  [facility];  Service: General;;  . ILEOSTOMY CLOSURE N/A 12/12/2015   Procedure: OPEN ILEOSTOMY REVERSAL AND PELVIC WASHOUT;  Surgeon: Leighton Ruff, MD;  [facility];  Service: General;  Laterality: N/A;  . INTRAOPERATIVE ARTERIOGRAM Right 08/10/2015   Procedure: INTRA OPERATIVE ARTERIOGRAM Right Iliac Artery;  Surgeon: Rosetta Posner, MD;  Location: [place];  Service:  Vascular;  Laterality: Right;  . IR GENERIC HISTORICAL  09/12/2015   IR RADIOLOGIST EVAL & MGMT 09/12/2015 Monia Sabal, PA-C [facility] INTERV RAD  . IV antibiotic infusion therapy     . PERIPHERAL VASCULAR CATHETERIZATION N/A 08/09/2015   Procedure: Abdominal Aortogram w/Lower Extremity;  Surgeon: Conrad Hinsdale, MD;  Location: [place] [facility];  Service: Cardiovascular;  Laterality: N/A;  . PERIPHERAL VASCULAR CATHETERIZATION Left 08/09/2015   Procedure: Peripheral Vascular Intervention;  Surgeon: Conrad Poinsett, MD;  Location: [place] [facility];  Service: Cardiovascular;  Laterality: Left;  common iliac  . PICC LINE PLACE PERIPHERAL ([place] HX)    . THROMBECTOMY FEMORAL ARTERY Bilateral 09/05/2015   Procedure: THROMBECTOMY Left to Right Femoral to  FEMORAL ARTERY Bypass,.;  Surgeon: Mal Misty, MD;  Location: [place];  Service: Vascular;  Laterality: Bilateral;  . THROMBECTOMY ILIAC ARTERY Right 08/10/2015   Procedure: THROMBECTOMY Right ILIAC ARTERY;  Surgeon: Rosetta Posner, MD;  Location: [place];  Service: Vascular;  Laterality: Right;  . TUBAL LIGATION    . UPPER GASTROINTESTINAL ENDOSCOPY      Current Outpatient Rx  . Order #: [id number] Class: Historical Med    Allergies Codeine  Family History  Problem Relation Age of Onset  . Rectal cancer Cousin   . Hypertension Mother   . Hyperparathyroidism Mother   . Hypertension Father   . CAD Father        CABG  . Peripheral vascular disease Father   . Graves' disease Father   . Colon cancer Neg Hx   . Stomach cancer Neg Hx   . Esophageal cancer Neg Hx   . Deep vein thrombosis Neg Hx     Social History Social History   Tobacco Use  . Smoking status: Current Every Day Smoker    Packs/day: 1.50    Years: 30.00    Pack years: 45.00    Types: Cigarettes  . Smokeless tobacco: Never Used  . Tobacco comment: Less than 10 cigarettes per day  Substance Use Topics  . Alcohol use: No    Alcohol/week: 0.0 standard drinks  . Drug use: No    Review of Systems  Constitutional: No fever/chills Eyes: No visual changes. ENT: No sore throat. Cardiovascular: Denies chest pain. Respiratory: Denies shortness of breath. Gastrointestinal: Positive abdominal pain. Positive nausea and vomiting.  No diarrhea. Positive constipation. Genitourinary: Negative for dysuria. Musculoskeletal: Negative for back pain. Skin: Negative for rash. Neurological: Negative for headaches, focal weakness or numbness.  10-point ROS otherwise  negative.  ____________________________________________   PHYSICAL EXAM:  VITAL SIGNS: ED Triage Vitals  Enc Vitals Group     BP 06/07/18 0836 (!) 181/89     Pulse Rate 06/07/18 0836 94     Resp 06/07/18 0836 18     Temp 06/07/18 0836 98 F (36.7 C)     Temp Source 06/07/18 0836 Oral     SpO2 06/07/18 0836 98 %     Weight 06/07/18 0833 185 lb (83.9 kg)     Height 06/07/18 0833 5\' 2"  (1.575 m)    Constitutional: Alert and oriented. Well appearing and in no acute distress. Eyes: Conjunctivae are normal.  Head: Atraumatic. Nose: No congestion/rhinnorhea. Mouth/Throat: Mucous membranes are moist. Neck: No stridor.   Cardiovascular: Normal rate, regular rhythm. Good peripheral circulation. Grossly normal heart sounds.   Respiratory: Normal respiratory effort.  No retractions. Lungs CTAB. Gastrointestinal: Soft with diffuse mild tenderness. No rebound or guarding. No distention.  Musculoskeletal:  No lower extremity tenderness nor edema. No gross deformities of extremities. Neurologic:  Normal speech and language. No gross focal neurologic deficits are appreciated.  Skin:  Skin is warm, dry and intact. No rash noted.   ____________________________________________   LABS (all labs ordered are listed, but only abnormal results are displayed)  Labs Reviewed  COMPREHENSIVE METABOLIC PANEL - Abnormal; Notable for the following components:      Result Value   Glucose, Bld 119 (*)    AST 13 (*)    All other components within normal limits  CBC - Abnormal; Notable for the following components:   WBC 14.5 (*)    RBC 5.27 (*)    HCT 46.3 (*)    All other components within normal limits  URINALYSIS, ROUTINE W REFLEX MICROSCOPIC - Abnormal; Notable for the following components:   Color, Urine STRAW (*)    All other components within normal limits  LIPASE, BLOOD  HIV ANTIBODY (ROUTINE TESTING W REFLEX)  PROTIME-INR    ____________________________________________  RADIOLOGY  Ct Abdomen Pelvis W Contrast  Result Date: 06/07/2018 CLINICAL DATA:  Vomiting and constipation. EXAM: CT ABDOMEN AND PELVIS WITH CONTRAST TECHNIQUE: Multidetector CT imaging of the abdomen and pelvis was performed using the standard protocol following bolus administration of intravenous contrast. CONTRAST:  124mL ISOVUE-300 IOPAMIDOL (ISOVUE-300) INJECTION 61% COMPARISON:  12/05/2015 FINDINGS: Lower chest: No acute abnormality. Hepatobiliary: Tiny low-density structure in left lobe of liver measuring 4 mm is too small to reliably characterize. There are multiple small stones within the gallbladder. No gallbladder wall thickening or pericholecystic fluid. No biliary ductal dilatation. Pancreas: Unremarkable. No pancreatic ductal dilatation or surrounding inflammatory changes. Spleen: Normal in size without focal abnormality. Adrenals/Urinary Tract: Normal adrenal glands. The kidneys are unremarkable. No mass or hydronephrosis. The urinary bladder appears normal. Stomach/Bowel: Stomach appears normal. No small bowel dilatation identified. Status post low anterior resection. Interval reversal of previous ileostomy. Vascular/Lymphatic: Aortic atherosclerosis without aneurysm. Porta hepatic lymph node measures 1.3 cm, image 25/2. Previously 1.2 cm. Small left retroperitoneal lymph nodes appear unchanged. Extensive surgical clips are identified within bilateral iliac nodal change. No adenopathy identified. Patent fem-fem bypass. Reproductive: Choose 2. Other: Bilobed fluid collection within the presacral soft tissues and right posterior pelvic sidewall is again noted. Along the right posterior pelvic sidewall fluid collection measures 5.1 x 2.9 cm, image 70/2. Previously 4.4 x 1.5 cm. The component within the presacral region measures 4.2 by 2.0 cm, image 63/2. Previously 3.3 x 1.6 cm. Here, there is accumulation of high attenuation contrast material,  image 59/7. There are several loops of small bowel which appear closely associated with this area and may have fistulous communication with the presacral fluid collection, image 64/2. Musculoskeletal: No aggressive lytic or sclerotic bone lesions. IMPRESSION: 1. In no abnormal dilatation of the small or large bowel loops to suggest bowel obstruction. 2. Bilobed fluid collection within the presacral soft tissues and right posterior pelvic sidewall is again noted and appears increased in size from previous exam. High attenuation material within the presacral component is noted which may reflect underlying fistulous communication with adjacent bowel loops. 3.  Aortic Atherosclerosis (ICD10-I70.0). Electronically Signed   By: Kerby Moors M.D.   On: 06/07/2018 11:23    ____________________________________________   PROCEDURES  Procedure(s) performed:   Procedures  None  ____________________________________________   INITIAL IMPRESSION / ASSESSMENT AND PLAN / ED COURSE  Pertinent labs & imaging results that were available during my care of the patient were reviewed by me and  considered in my medical decision making (see chart for details).  Patient with history of small bowel obstruction sent to the emergency department with times concerning for possible bowel obstruction here.  She has mild diffuse tenderness.  No significant bowel movement or passage of flatus in the last several days.  She is having nausea and vomiting as well.  Given her history, plan for CT abdomen pelvis to rule out obstruction along with baseline labs, IV fluids, and reassess.  12:30 PM Spoke with Dr. Arnoldo Morale with general surgery.  Plan for Zosyn and medicine admit for IR drain placement.  He will see the patient in consult tomorrow but there is no acute surgical intervention to be done at this time.   Discussed patient's case with Hospitalist to request admission. Patient and family (if present) updated with plan. Care  transferred to Hospitalist service.  I reviewed all nursing notes, vitals, pertinent old records, EKGs, labs, imaging (as available).  ____________________________________________  FINAL CLINICAL IMPRESSION(S) / ED DIAGNOSES  Final diagnoses:  Pelvic fluid collection  Generalized abdominal pain     MEDICATIONS GIVEN DURING THIS VISIT:  Medications  sodium chloride flush (NS) 0.9 % injection 3 mL (3 mLs Intravenous Given 06/07/18 1318)  sodium chloride flush (NS) 0.9 % injection 3 mL (has no administration in time range)  0.9 %  sodium chloride infusion (has no administration in time range)  acetaminophen (TYLENOL) tablet 650 mg (has no administration in time range)    Or  acetaminophen (TYLENOL) suppository 650 mg (has no administration in time range)  traZODone (DESYREL) tablet 50 mg (has no administration in time range)  polyethylene glycol (MIRALAX / GLYCOLAX) packet 17 g (has no administration in time range)  ondansetron (ZOFRAN) tablet 4 mg (has no administration in time range)    Or  ondansetron (ZOFRAN) injection 4 mg (has no administration in time range)  albuterol (PROVENTIL) (2.5 MG/3ML) 0.083% nebulizer solution 2.5 mg (has no administration in time range)  dextrose 5 %-0.45 % sodium chloride infusion ( Intravenous New Bag/Given 06/07/18 1403)  oxyCODONE (Oxy IR/ROXICODONE) immediate release tablet 5 mg (has no administration in time range)  morphine 2 MG/ML injection 2 mg (has no administration in time range)  piperacillin-tazobactam (ZOSYN) IVPB 3.375 g (has no administration in time range)  sodium chloride 0.9 % bolus 1,000 mL (0 mLs Intravenous Stopped 06/07/18 1109)  ondansetron (ZOFRAN) injection 4 mg (4 mg Intravenous Given 06/07/18 1008)  iopamidol (ISOVUE-300) 61 % injection 100 mL (100 mLs Intravenous Contrast Given 06/07/18 1034)  piperacillin-tazobactam (ZOSYN) IVPB 3.375 g (0 g Intravenous Stopped 06/07/18 1351)    Note:  This document was prepared using  Dragon voice recognition software and may include unintentional dictation errors.  Nanda Quinton, MD Emergency Medicine    Tifany Hirsch, [facility], MD 06/07/18 [phone]

## 2018-06-07 NOTE — ED Triage Notes (Signed)
Pt reports vomiting and constipation x 3 days.

## 2018-06-07 NOTE — ED Notes (Signed)
ED TO INPATIENT HANDOFF REPORT  Name/Age/Gender Cathy Jones 58 y.o. female  Code Status    Code Status Orders  (From admission, onward)         Start     Ordered   06/07/18 1309  Full code  Continuous     06/07/18 1308        Code Status History    Date Active Date Inactive Code Status Order ID Comments User Context   12/12/2015 1531 12/19/2015 1426 Full Code [id number]  Leighton Ruff, MD Inpatient   12/03/2015 1121 12/06/2015 1752 Full Code [id number]  Leighton Ruff, MD Inpatient   09/02/2015 1133 09/08/2015 1853 Full Code [id number]  Leighton Ruff, MD Inpatient   08/23/2015 0040 08/25/2015 1524 Full Code [id number]  Reubin Milan, MD Inpatient   08/08/2015 0201 08/12/2015 1605 Full Code [id number]  Edwin Dada, MD ED   07/31/2015 0203 08/04/2015 1505 Full Code [id number]  Reubin Milan, MD ED    Advance Directive Documentation     Most Recent Value  Type of Advance Directive  Healthcare Power of Attorney, Living will  Pre-existing out of facility DNR order (yellow form or pink MOST form)  -  "MOST" Form in Place?  -      Home/SNF/Other Home  Chief Complaint VOMITING,CONSTIPATION  Level of Care/Admitting Diagnosis ED Disposition    ED Disposition Condition [place]: [facility] [[id number]]  Level of Care: Med-Surg [16]  Diagnosis: Pelvic abscess in female [[id number]]  Admitting Physician: Morrison Old  Attending Physician: Morrison Old  Estimated length of stay: 3 - 4 days  Certification:: I certify this patient will need inpatient services for at least 2 midnights  Bed request comments: med  PT Class (Do Not Modify): Inpatient [101]  PT Acc Code (Do Not Modify): Private [1]       Medical History Past Medical History:  Diagnosis Date  . Acute renal failure (ARF) ([place]) 08/07/2015  . Anemia   . Asthma    pt brought her proair inhaler 06-06-14  . Cervical cancer ([place])   . Colon polyp    hyperplastic  . 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 Colonic obstruction ([place])   . Depression   . Family history of adverse reaction to anesthesia    pts mother experiences nausea and vomiting   . GERD (gastroesophageal reflux disease)   . History of bronchitis   . History of chemotherapy   . History of hiatal hernia   . History of radiation therapy   . Hyperlipidemia   . Hypertension   . 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 Migraine    "q couple years maybe" (08/07/2015)  . Shortness of breath dyspnea    seasonal     Allergies Allergies  Allergen Reactions  . Codeine Other (See Comments)    "hyper"    IV Location/Drains/Wounds Patient Lines/Drains/Airways Status   Active Line/Drains/Airways    Name:   Placement date:   Placement time:   Site:   Days:   Peripheral IV 06/07/18 Right Antecubital   06/07/18    0959    Antecubital   less than 1   Closed System Drain 1 Right;Posterior;Other (Comment) Buttock Bulb (JP) 10 Fr.   09/05/15    1257    Buttock   1006   Incision (Closed) 09/05/15 Groin Right   09/05/15    2009     1006   Incision (Closed) 12/12/15 Abdomen  12/12/15    1235     908          Labs/Imaging Results for orders placed or performed during the hospital encounter of 06/07/18 (from the past 48 hour(s))  Lipase, blood     Status: None   Collection Time: 06/07/18  9:02 AM  Result Value Ref Range   Lipase 28 11 - 51 U/L    Comment: Performed at [facility], [address], [place], [place] [address]  Comprehensive metabolic panel     Status: Abnormal   Collection Time: 06/07/18  9:02 AM  Result Value Ref Range   Sodium 138 135 - 145 mmol/L   Potassium 4.2 3.5 - 5.1 mmol/L   Chloride 105 98 - 111 mmol/L   CO2 22 22 - 32 mmol/L   Glucose, Bld 119 (H) 70 - 99 mg/dL   BUN 14 6 - 20 mg/dL   Creatinine, Ser 0.77 0.44 - 1.00 mg/dL   Calcium 9.1 8.9 - 10.3 mg/dL   Total Protein 6.9 6.5 - 8.1 g/dL   Albumin 3.8 3.5 - 5.0 g/dL   AST 13 (L) 15 - 41 U/L   ALT  9 0 - 44 U/L   Alkaline Phosphatase 96 38 - 126 U/L   Total Bilirubin 0.5 0.3 - 1.2 mg/dL   GFR calc non Af Amer >60 >60 mL/min   GFR calc Af Amer >60 >60 mL/min   Anion gap 11 5 - 15    Comment: Performed at [facility], [address], [place], [place] [address]  CBC     Status: Abnormal   Collection Time: 06/07/18  9:02 AM  Result Value Ref Range   WBC 14.5 (H) 4.0 - 10.5 K/uL   RBC 5.27 (H) 3.87 - 5.11 MIL/uL   Hemoglobin 14.1 12.0 - 15.0 g/dL   HCT 46.3 (H) 36.0 - 46.0 %   MCV 87.9 80.0 - 100.0 fL   MCH 26.8 26.0 - 34.0 pg   MCHC 30.5 30.0 - 36.0 g/dL   RDW 13.7 11.5 - 15.5 %   Platelets 194 150 - 400 K/uL   nRBC 0.0 0.0 - 0.2 %    Comment: Performed at [facility], [address], [place], [place] [address]  Urinalysis, Routine w reflex microscopic     Status: Abnormal   Collection Time: 06/07/18 10:09 AM  Result Value Ref Range   Color, Urine STRAW (A) YELLOW   APPearance CLEAR CLEAR   Specific Gravity, Urine 1.006 1.005 - 1.030   pH 5.0 5.0 - 8.0   Glucose, UA NEGATIVE NEGATIVE mg/dL   Hgb urine dipstick NEGATIVE NEGATIVE   Bilirubin Urine NEGATIVE NEGATIVE   Ketones, ur NEGATIVE NEGATIVE mg/dL   Protein, ur NEGATIVE NEGATIVE mg/dL   Nitrite NEGATIVE NEGATIVE   Leukocytes, UA NEGATIVE NEGATIVE    Comment: Performed at [facility], [address], [place], [place] [address]  Protime-INR     Status: None   Collection Time: 06/07/18  3:31 PM  Result Value Ref Range   Prothrombin Time 13.3 11.4 - 15.2 seconds   INR 1.02     Comment: Performed at [facility], [address] [place], [place] [address]   Ct Abdomen Pelvis W Contrast  Result Date: 06/07/2018 CLINICAL DATA:  Vomiting and constipation. EXAM: CT ABDOMEN AND PELVIS WITH CONTRAST TECHNIQUE: Multidetector CT imaging of the abdomen and pelvis was performed using the standard protocol following bolus administration of intravenous contrast. CONTRAST:  132mL ISOVUE-300 IOPAMIDOL (ISOVUE-300) INJECTION  61%  COMPARISON:  12/05/2015 FINDINGS: Lower chest: No acute abnormality. Hepatobiliary: Tiny low-density structure in left lobe of liver measuring 4 mm is too small to reliably characterize. There are multiple small stones within the gallbladder. No gallbladder wall thickening or pericholecystic fluid. No biliary ductal dilatation. Pancreas: Unremarkable. No pancreatic ductal dilatation or surrounding inflammatory changes. Spleen: Normal in size without focal abnormality. Adrenals/Urinary Tract: Normal adrenal glands. The kidneys are unremarkable. No mass or hydronephrosis. The urinary bladder appears normal. Stomach/Bowel: Stomach appears normal. No small bowel dilatation identified. Status post low anterior resection. Interval reversal of previous ileostomy. Vascular/Lymphatic: Aortic atherosclerosis without aneurysm. Porta hepatic lymph node measures 1.3 cm, image 25/2. Previously 1.2 cm. Small left retroperitoneal lymph nodes appear unchanged. Extensive surgical clips are identified within bilateral iliac nodal change. No adenopathy identified. Patent fem-fem bypass. Reproductive: Choose 2. Other: Bilobed fluid collection within the presacral soft tissues and right posterior pelvic sidewall is again noted. Along the right posterior pelvic sidewall fluid collection measures 5.1 x 2.9 cm, image 70/2. Previously 4.4 x 1.5 cm. The component within the presacral region measures 4.2 by 2.0 cm, image 63/2. Previously 3.3 x 1.6 cm. Here, there is accumulation of high attenuation contrast material, image 59/7. There are several loops of small bowel which appear closely associated with this area and may have fistulous communication with the presacral fluid collection, image 64/2. Musculoskeletal: No aggressive lytic or sclerotic bone lesions. IMPRESSION: 1. In no abnormal dilatation of the small or large bowel loops to suggest bowel obstruction. 2. Bilobed fluid collection within the presacral soft tissues and right posterior  pelvic sidewall is again noted and appears increased in size from previous exam. High attenuation material within the presacral component is noted which may reflect underlying fistulous communication with adjacent bowel loops. 3.  Aortic Atherosclerosis (ICD10-I70.0). Electronically Signed   By: Kerby Moors M.D.   On: 06/07/2018 11:23    Pending Labs Unresulted Labs (From admission, onward)    Start     Ordered   06/08/18 6720  Basic metabolic panel  Tomorrow morning,   R     06/07/18 1308   06/08/18 0500  CBC  Tomorrow morning,   R     06/07/18 1308   06/07/18 1309  HIV antibody (Routine Testing)  Once,   R     06/07/18 1308          Vitals/Pain Today's Vitals   06/07/18 1403 06/07/18 1615 06/07/18 1630 06/07/18 1700  BP: 129/62 (!) 133/116 113/72 139/68  Pulse: 77     Resp:      Temp:      TempSrc:      SpO2: 93%     Weight:      Height:      PainSc:        Isolation Precautions No active isolations  Medications Medications  sodium chloride flush (NS) 0.9 % injection 3 mL (3 mLs Intravenous Given 06/07/18 1318)  sodium chloride flush (NS) 0.9 % injection 3 mL (has no administration in time range)  0.9 %  sodium chloride infusion (has no administration in time range)  acetaminophen (TYLENOL) tablet 650 mg (has no administration in time range)    Or  acetaminophen (TYLENOL) suppository 650 mg (has no administration in time range)  traZODone (DESYREL) tablet 50 mg (has no administration in time range)  polyethylene glycol (MIRALAX / GLYCOLAX) packet 17 g (has no administration in time range)  ondansetron (ZOFRAN) tablet 4 mg (has no administration  in time range)    Or  ondansetron (ZOFRAN) injection 4 mg (has no administration in time range)  albuterol (PROVENTIL) (2.5 MG/3ML) 0.083% nebulizer solution 2.5 mg (has no administration in time range)  dextrose 5 %-0.45 % sodium chloride infusion ( Intravenous New Bag/Given 06/07/18 1403)  oxyCODONE (Oxy IR/ROXICODONE)  immediate release tablet 5 mg (has no administration in time range)  morphine 2 MG/ML injection 2 mg (has no administration in time range)  piperacillin-tazobactam (ZOSYN) IVPB 3.375 g (has no administration in time range)  sodium chloride 0.9 % bolus 1,000 mL (0 mLs Intravenous Stopped 06/07/18 1109)  ondansetron (ZOFRAN) injection 4 mg (4 mg Intravenous Given 06/07/18 1008)  iopamidol (ISOVUE-300) 61 % injection 100 mL (100 mLs Intravenous Contrast Given 06/07/18 1034)  piperacillin-tazobactam (ZOSYN) IVPB 3.375 g (0 g Intravenous Stopped 06/07/18 1351)    Mobility walks

## 2018-06-07 NOTE — ED Notes (Signed)
Patient transported to CT 

## 2018-06-07 NOTE — Progress Notes (Signed)
   Pt is scheduled for [facility] IR procedure tomorrow 1/28  Pt needs to be at [facility] at 900 am via ambulance. We will perform procedure then pt will return to [facility]  See orders  Thx

## 2018-06-07 NOTE — Progress Notes (Signed)
Pharmacy Antibiotic Note  Cathy Jones is a 58 y.o. female admitted on 06/07/2018 with intra-abdominal infection.  Pharmacy has been consulted for Zosyn dosing.  Plan: Zosyn 3.375g IV q8h (4 hour infusion).  Monitor labs, c/s, and patient improvement.  Height: 5\' 2"  (157.5 cm) Weight: 185 lb (83.9 kg) IBW/kg (Calculated) : 50.1  Temp (24hrs), Avg:98 F (36.7 C), Min:98 F (36.7 C), Max:98 F (36.7 C)  Recent Labs  Lab 06/07/18 0902  WBC 14.5*  CREATININE 0.77    Estimated Creatinine Clearance: 78.8 mL/min (by C-G formula based on SCr of 0.77 mg/dL).    Allergies  Allergen Reactions  . Codeine Other (See Comments)    "hyper"    Antimicrobials this admission: Zosyn 1/27 >>     Dose adjustments this admission: N/A  Microbiology results: None pending  Thank you for allowing pharmacy to be a part of this patient's care.  Ramond Craver 06/07/2018 2:06 PM

## 2018-06-07 NOTE — H&P (Addendum)
Patient Demographics:    Cathy Jones, is a 58 y.o. female  MRN: [id number]   DOB - 1961/05/04  Admit Date - 06/07/2018  Outpatient Primary MD for the patient is Cathy Halim., PA-C   Assessment & Plan:    Principal Problem:   Pelvic abscess in female Active Problems:   Rectal stricture s/p LAR resection Feb2017   B12 deficiency anemia   Folic acid deficiency anemia   PAD (peripheral artery disease) (HCC)   Tobacco abuse  CT Abd/Pelvis Bilobed fluid collection within the presacral soft tissues and right posterior pelvic sidewall is again noted and appears increased in size from previous exam. High attenuation material within the presacral component is noted which may reflect underlying fistulous communication with adjacent bowel loops.  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 Acute renal failure (ARF) ([place]) 08/07/2015  . Anemia   . Asthma    pt brought her proair inhaler 06-06-14  . Cervical cancer ([place])   . Colon polyp    hyperplastic  . Colon stricture ([place])   . Colonic obstruction ([place])   . Depression   . Family history of adverse reaction to anesthesia    pts mother experiences nausea and vomiting   . GERD (gastroesophageal reflux disease)   . History of bronchitis   . History of chemotherapy   . History of hiatal hernia   . History of radiation therapy   . Hyperlipidemia   . Hypertension   . 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 Migraine    "q couple years maybe" (08/07/2015)  . Shortness of breath dyspnea    seasonal       Past Surgical History:  Procedure Laterality Date  . ABDOMINAL HYSTERECTOMY  1994  . COLON RESECTION N/A 06/27/2015   Procedure: LYSIS OF ADHESIONS LOW ANTERIOR RESECTION DIVERTING LOOP ILEOSTOMY;  Surgeon: Leighton Ruff, MD;  Location: [facility];  Service: General;  Laterality: N/A;  . COLON SURGERY    . COLONOSCOPY    . Time  Bilateral 06/27/2015   Procedure: CYSTOSCOPY WITH CATHETER  PLACEMENT;  Surgeon: Franchot Gallo, MD;  Location: [facility];  Service: Urology;  Laterality: Bilateral;  . DIVERTING ILEOSTOMY N/A 06/27/2015   Procedure: DIVERTING LOOP ILEOSTOMY;  Surgeon: Leighton Ruff, MD;  Location: [facility];  Service: General;  Laterality: N/A;  . FEMORAL-FEMORAL BYPASS GRAFT Bilateral 08/10/2015   Procedure: BYPASS GRAFT LEFT FEMORALTO RIGHT FEMORAL ARTERY;  Surgeon: Rosetta Posner, MD;  Location: [place];  Service: Vascular;  Laterality: Bilateral;  . FLEXIBLE SIGMOIDOSCOPY  12/12/2015   Procedure: FLEXIBLE SIGMOIDOSCOPY;  Surgeon: Leighton Ruff, MD;  Location: [facility];  Service: General;;  . ILEOSTOMY CLOSURE N/A 12/12/2015   Procedure: OPEN ILEOSTOMY REVERSAL AND PELVIC WASHOUT;  Surgeon: Leighton Ruff, MD;  Location: [facility];  Service: General;  Laterality: N/A;  .  INTRAOPERATIVE ARTERIOGRAM Right 08/10/2015   Procedure: INTRA OPERATIVE ARTERIOGRAM Right Iliac Artery;  Surgeon: Rosetta Posner, MD;  Location: [place];  Service: Vascular;  Laterality: Right;  . IR GENERIC HISTORICAL  09/12/2015   IR RADIOLOGIST EVAL & MGMT 09/12/2015 Monia Sabal, PA-C [facility] INTERV RAD  . IV antibiotic infusion therapy     . PERIPHERAL VASCULAR CATHETERIZATION N/A 08/09/2015   Procedure: Abdominal Aortogram w/Lower Extremity;  Surgeon: Conrad Bee Ridge, MD;  Location: [place] CV LAB;  Service: Cardiovascular;  Laterality: N/A;  . PERIPHERAL VASCULAR CATHETERIZATION Left 08/09/2015   Procedure: Peripheral Vascular Intervention;  Surgeon: Conrad [place]endale, MD;  Location: [place] [facility] LAB;  Service: Cardiovascular;  Laterality: Left;  common iliac  . PICC LINE PLACE PERIPHERAL ([place] HX)    . THROMBECTOMY FEMORAL ARTERY Bilateral 09/05/2015   Procedure: THROMBECTOMY Left to Right Femoral to  FEMORAL ARTERY Bypass,.;  Surgeon: Mal Misty, MD;  Location: [place];  Service: Vascular;  Laterality: Bilateral;  . THROMBECTOMY ILIAC ARTERY Right 08/10/2015   Procedure: THROMBECTOMY Right ILIAC ARTERY;  Surgeon: Rosetta Posner, MD;  Location: [place];  Service: Vascular;  Laterality: Right;  . TUBAL LIGATION    . UPPER GASTROINTESTINAL ENDOSCOPY       Chief Complaint  Patient presents with  . Abdominal Pain      HPI:    Cathy Jones  is a 58 y.o. 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 Clinical exam and CT abdomen and pelvis confirms pelvic abscess, discussed with interventional radiologist at [facility] and local radiologist GI Dr. [place] Papas , also discussed with on-call general surgeon Dr. Arnoldo Morale  In ED... Lipase is not elevated, white count is up to 14.5, UA is not suspicious for UTI, electrolytes and LFTs are okay  In ED... IV Unasyn was initiated   Review of systems:    In addition to the HPI above,   A full Review of  Systems was done, all other systems reviewed are negative except  as noted above in HPI , .    Social History:  Reviewed by me    Social History   Tobacco Use  . Smoking status: Current Every Day Smoker    Packs/day: 1.50    Years: 30.00    Pack years: 45.00    Types: Cigarettes  . Smokeless tobacco: Never Used  . Tobacco comment: Less than 10 cigarettes per day  Substance Use Topics  . Alcohol use: No    Alcohol/week: 0.0 standard drinks       Family History :  Reviewed by me    Family History  Problem Relation Age of Onset  . Rectal cancer Cousin   . Hypertension Mother   . Hyperparathyroidism Mother   . Hypertension Father   . CAD Father        CABG  . Peripheral vascular disease Father   . Graves' disease Father   . Colon cancer Neg Hx   . Stomach cancer Neg Hx   . Esophageal cancer Neg Hx   . Deep vein thrombosis Neg Hx      Home Medications:   Prior to Admission medications   Medication Sig Start Date End Date Taking? Authorizing Provider  Probiotic Product (PROBIOTIC PO) Take 1 tablet by mouth daily.   Yes [provider]     Allergies:     Allergies  Allergen Reactions  . Codeine Other (See Comments)    "hyper"     Physical Exam:   Vitals  Blood pressure 139/68, pulse 77, temperature 98 F (36.7 C), temperature source Oral, resp. rate  18, height 5\' 2"  (1.575 m), weight 83.9 kg, SpO2 93 %.  Physical Examination: General appearance - alert, well appearing, and in no distress  Mental status - alert, oriented to person, place, and time,  Eyes - sclera anicteric Neck - supple, no JVD elevation , Chest - clear  to auscultation bilaterally, symmetrical air movement,  Heart - S1 and S2 normal, regular  Abdomen - soft, scars from prior laparotomy noted, abdomen is nondistended, lower abdominal tenderness without significant rebound or guarding noted , bowel sounds are present  neurological - screening mental status exam normal, neck supple without rigidity, cranial nerves II through XII intact, DTR's normal and symmetric Extremities - no pedal edema noted, intact peripheral pulses  Skin - warm, dry     Data Review:    CBC Recent Labs  Lab 06/07/18 0902  WBC 14.5*  HGB 14.1  HCT 46.3*  PLT 194  MCV 87.9  MCH 26.8  MCHC 30.5  RDW 13.7   ------------------------------------------------------------------------------------------------------------------  Chemistries  Recent Labs  Lab 06/07/18 0902  NA 138  K 4.2  CL 105  CO2 22  GLUCOSE 119*  BUN 14  CREATININE 0.77  CALCIUM 9.1  AST 13*  ALT 9  ALKPHOS 96  BILITOT 0.5   ------------------------------------------------------------------------------------------------------------------ estimated creatinine clearance is 78.8 mL/min (by C-G formula based on SCr of 0.77 mg/dL). ------------------------------------------------------------------------------------------------------------------ No results for input(s): TSH, T4TOTAL, T3FREE, THYROIDAB in the last 72 hours.  Invalid input(s): FREET3   Coagulation profile Recent Labs  Lab 06/07/18 1531  INR 1.02   ------------------------------------------------------------------------------------------------------------------- No results for input(s): DDIMER in the last 72  hours. -------------------------------------------------------------------------------------------------------------------  Cardiac Enzymes No results for input(s): CKMB, TROPONINI, MYOGLOBIN in the last 168 hours.  Invalid input(s): CK ------------------------------------------------------------------------------------------------------------------ No results found for: BNP   ---------------------------------------------------------------------------------------------------------------  Urinalysis    Component Value Date/Time   COLORURINE STRAW (A) 06/07/2018 1009   APPEARANCEUR  CLEAR 06/07/2018 1009   LABSPEC 1.006 06/07/2018 1009   PHURINE 5.0 06/07/2018 1009   GLUCOSEU NEGATIVE 06/07/2018 1009   HGBUR NEGATIVE 06/07/2018 1009   [place] 06/07/2018 1009   [place] 06/07/2018 1009   PROTEINUR NEGATIVE 06/07/2018 1009   NITRITE NEGATIVE 06/07/2018 1009   LEUKOCYTESUR NEGATIVE 06/07/2018 1009    ----------------------------------------------------------------------------------------------------------------   Imaging Results:    Ct Abdomen Pelvis W Contrast  Result Date: 06/07/2018 CLINICAL DATA:  Vomiting and constipation. EXAM: CT ABDOMEN AND PELVIS WITH CONTRAST TECHNIQUE: Multidetector CT imaging of the abdomen and pelvis was performed using the standard protocol following bolus administration of intravenous contrast. CONTRAST:  13mL ISOVUE-300 IOPAMIDOL (ISOVUE-300) INJECTION 61% COMPARISON:  12/05/2015 FINDINGS: Lower chest: No acute abnormality. Hepatobiliary: Tiny low-density structure in left lobe of liver measuring 4 mm is too small to reliably characterize. There are multiple small stones within the gallbladder. No gallbladder wall thickening or pericholecystic fluid. No biliary ductal dilatation. Pancreas: Unremarkable. No pancreatic ductal dilatation or surrounding inflammatory changes. Spleen: Normal in size without focal abnormality.  Adrenals/Urinary Tract: Normal adrenal glands. The kidneys are unremarkable. No mass or hydronephrosis. The urinary bladder appears normal. Stomach/Bowel: Stomach appears normal. No small bowel dilatation identified. Status post low anterior resection. Interval reversal of previous ileostomy. Vascular/Lymphatic: Aortic atherosclerosis without aneurysm. Porta hepatic lymph node measures 1.3 cm, image 25/2. Previously 1.2 cm. Small left retroperitoneal lymph nodes appear unchanged. Extensive surgical clips are identified within bilateral iliac nodal change. No adenopathy identified. Patent fem-fem bypass. Reproductive: Choose 2. Other: Bilobed fluid collection within the presacral soft tissues and right posterior pelvic sidewall is again noted. Along the right posterior pelvic sidewall fluid collection measures 5.1 x 2.9 cm, image 70/2. Previously 4.4 x 1.5 cm. The component within the presacral region measures 4.2 by 2.0 cm, image 63/2. Previously 3.3 x 1.6 cm. Here, there is accumulation of high attenuation contrast material, image 59/7. There are several loops of small bowel which appear closely associated with this area and may have fistulous communication with the presacral fluid collection, image 64/2. Musculoskeletal: No aggressive lytic or sclerotic bone lesions. IMPRESSION: 1. In no abnormal dilatation of the small or large bowel loops to suggest bowel obstruction. 2. Bilobed fluid collection within the presacral soft tissues and right posterior pelvic sidewall is again noted and appears increased in size from previous exam. High attenuation material within the presacral component is noted which may reflect underlying fistulous communication with adjacent bowel loops. 3.  Aortic Atherosclerosis (ICD10-I70.0). Electronically Signed   By: Kerby Moors M.D.   On: 06/07/2018 11:23    Radiological Exams on Admission: Ct Abdomen Pelvis W Contrast  Result Date: 06/07/2018 CLINICAL DATA:  Vomiting and  constipation. EXAM: CT ABDOMEN AND PELVIS WITH CONTRAST TECHNIQUE: Multidetector CT imaging of the abdomen and pelvis was performed using the standard protocol following bolus administration of intravenous contrast. CONTRAST:  135mL ISOVUE-300 IOPAMIDOL (ISOVUE-300) INJECTION 61% COMPARISON:  12/05/2015 FINDINGS: Lower chest: No acute abnormality. Hepatobiliary: Tiny low-density structure in left lobe of liver measuring 4 mm is too small to reliably characterize. There are multiple small stones within the gallbladder. No gallbladder wall thickening or pericholecystic fluid. No biliary ductal dilatation. Pancreas: Unremarkable. No pancreatic ductal dilatation or surrounding inflammatory changes. Spleen: Normal in size without focal abnormality. Adrenals/Urinary Tract: Normal adrenal glands. The kidneys are unremarkable. No mass or hydronephrosis. The urinary bladder appears normal. Stomach/Bowel: Stomach appears normal. No small bowel dilatation identified. Status post low anterior resection. Interval reversal of  previous ileostomy. Vascular/Lymphatic: Aortic atherosclerosis without aneurysm. Porta hepatic lymph node measures 1.3 cm, image 25/2. Previously 1.2 cm. Small left retroperitoneal lymph nodes appear unchanged. Extensive surgical clips are identified within bilateral iliac nodal change. No adenopathy identified. Patent fem-fem bypass. Reproductive: Choose 2. Other: Bilobed fluid collection within the presacral soft tissues and right posterior pelvic sidewall is again noted. Along the right posterior pelvic sidewall fluid collection measures 5.1 x 2.9 cm, image 70/2. Previously 4.4 x 1.5 cm. The component within the presacral region measures 4.2 by 2.0 cm, image 63/2. Previously 3.3 x 1.6 cm. Here, there is accumulation of high attenuation contrast material, image 59/7. There are several loops of small bowel which appear closely associated with this area and may have fistulous communication with the presacral  fluid collection, image 64/2. Musculoskeletal: No aggressive lytic or sclerotic bone lesions. IMPRESSION: 1. In no abnormal dilatation of the small or large bowel loops to suggest bowel obstruction. 2. Bilobed fluid collection within the presacral soft tissues and right posterior pelvic sidewall is again noted and appears increased in size from previous exam. High attenuation material within the presacral component is noted which may reflect underlying fistulous communication with adjacent bowel loops. 3.  Aortic Atherosclerosis (ICD10-I70.0). Electronically Signed   By: Kerby Moors M.D.   On: 06/07/2018 11:23    DVT Prophylaxis -SCD   AM Labs Ordered, also please review Full Orders  Family Communication: Admission, patients condition and plan of care including tests being ordered have been discussed with the patient who indicate understanding and agree with the plan   Code Status - Full Code  Likely DC to  home  Condition   stable  Roxan Hockey M.D on 06/07/2018 at 5:59 PM Go to [email or web address] -  for contact info  [facility] - Office  [phone]

## 2018-06-07 NOTE — ED Notes (Signed)
Dr Arnoldo Morale paged to Dr Laverta [place]

## 2018-06-08 ENCOUNTER — Encounter (HOSPITAL_COMMUNITY): Payer: Self-pay

## 2018-06-08 ENCOUNTER — Ambulatory Visit (HOSPITAL_COMMUNITY)
Admit: 2018-06-08 | Discharge: 2018-06-08 | Disposition: A | Discharge status: Other Acute Inpatient Hospital | Payer: Self-pay | Attending: Family Medicine | Admitting: Family Medicine

## 2018-06-08 DIAGNOSIS — Z9221 Personal history of antineoplastic chemotherapy: Secondary | ICD-10-CM

## 2018-06-08 DIAGNOSIS — K219 Gastro-esophageal reflux disease without esophagitis: Secondary | ICD-10-CM

## 2018-06-08 DIAGNOSIS — I10 Essential (primary) hypertension: Secondary | ICD-10-CM | POA: Insufficient documentation

## 2018-06-08 DIAGNOSIS — Z923 Personal history of irradiation: Secondary | ICD-10-CM | POA: Insufficient documentation

## 2018-06-08 DIAGNOSIS — J45909 Unspecified asthma, uncomplicated: Secondary | ICD-10-CM | POA: Insufficient documentation

## 2018-06-08 DIAGNOSIS — E785 Hyperlipidemia, unspecified: Secondary | ICD-10-CM | POA: Insufficient documentation

## 2018-06-08 DIAGNOSIS — Z85038 Personal history of other malignant neoplasm of large intestine: Secondary | ICD-10-CM | POA: Insufficient documentation

## 2018-06-08 DIAGNOSIS — F17210 Nicotine dependence, cigarettes, uncomplicated: Secondary | ICD-10-CM | POA: Insufficient documentation

## 2018-06-08 DIAGNOSIS — R188 Other ascites: Secondary | ICD-10-CM | POA: Insufficient documentation

## 2018-06-08 DIAGNOSIS — N739 Female pelvic inflammatory disease, unspecified: Secondary | ICD-10-CM

## 2018-06-08 DIAGNOSIS — Z8541 Personal history of malignant neoplasm of cervix uteri: Secondary | ICD-10-CM

## 2018-06-08 LAB — BASIC METABOLIC PANEL
Anion gap: 7 (ref 5–15)
BUN: 8 mg/dL (ref 6–20)
CO2: 25 mmol/L (ref 22–32)
Calcium: 8.8 mg/dL — ABNORMAL LOW (ref 8.9–10.3)
Chloride: 108 mmol/L (ref 98–111)
Creatinine, Ser: 0.84 mg/dL (ref 0.44–1.00)
GFR calc Af Amer: >60 mL/min (ref >60)
GFR calc non Af Amer: >60 mL/min (ref >60)
Glucose, Bld: 114 mg/dL — ABNORMAL HIGH (ref 70–99)
Potassium: 4.1 mmol/L (ref 3.5–5.1)
Sodium: 140 mmol/L (ref 135–145)

## 2018-06-08 LAB — HIV ANTIBODY (ROUTINE TESTING W REFLEX): HIV Screen 4th Generation wRfx: NONREACTIVE

## 2018-06-08 LAB — CBC
HCT: 42.8 % (ref 36.0–46.0)
Hemoglobin: 12.8 g/dL (ref 12.0–15.0)
MCH: 26.7 pg (ref 26.0–34.0)
MCHC: 29.9 g/dL — ABNORMAL LOW (ref 30.0–36.0)
MCV: 89.2 fL (ref 80.0–100.0)
NRBC: 0 % (ref 0.0–0.2)
PLATELETS: 182 10*3/uL (ref 150–400)
RBC: 4.8 MIL/uL (ref 3.87–5.11)
RDW: 13.8 % (ref 11.5–15.5)
WBC: 10.1 10*3/uL (ref 4.0–10.5)

## 2018-06-08 MED ORDER — FENTANYL CITRATE (PF) 100 MCG/2ML IJ SOLN
INTRAMUSCULAR | Status: AC
  Filled 2018-06-08: qty 4

## 2018-06-08 MED ORDER — SODIUM CHLORIDE 0.9% FLUSH: 5.0000 mL | Freq: Three times a day (TID) | INTRAVENOUS | Status: DC

## 2018-06-08 MED ORDER — LIDOCAINE HCL 1 % IJ SOLN
INTRAMUSCULAR | Status: AC
  Filled 2018-06-08: qty 20

## 2018-06-08 MED ORDER — MIDAZOLAM HCL 2 MG/2ML IJ SOLN
INTRAMUSCULAR | Status: DC | PRN
  Administered 2018-06-08: 0.5 mg via INTRAVENOUS
  Administered 2018-06-08: 1 mg via INTRAVENOUS
  Administered 2018-06-08 (×2): 0.5 mg via INTRAVENOUS

## 2018-06-08 MED ORDER — FENTANYL CITRATE (PF) 100 MCG/2ML IJ SOLN
INTRAMUSCULAR | Status: DC | PRN
  Administered 2018-06-08: 25 ug via INTRAVENOUS
  Administered 2018-06-08: 50 ug via INTRAVENOUS
  Administered 2018-06-08 (×2): 25 ug via INTRAVENOUS

## 2018-06-08 MED ORDER — HEPARIN SODIUM (PORCINE) 5000 UNIT/ML IJ SOLN
5000.0000 [IU] | Freq: Three times a day (TID) | INTRAMUSCULAR | Status: DC
  Administered 2018-06-08 – 2018-06-11 (×9): 5000 [IU] via SUBCUTANEOUS
  Filled 2018-06-08 (×9): qty 1

## 2018-06-08 MED ORDER — MIDAZOLAM HCL 2 MG/2ML IJ SOLN
INTRAMUSCULAR | Status: AC
  Filled 2018-06-08: qty 4

## 2018-06-08 NOTE — H&P (Signed)
Chief Complaint: Pelvic abscess  Referring Physician(s): Emokpae,Courage  Supervising Physician: Sandi Mariscal  Patient Status: AP in patient  History of Present Illness: Cathy Jones is a 58 y.o. female with history of low anterior resection with diverting ileostomy, subsequent ileostomy takedown in 2017.  She presented to [facility] ED with a recurrent pelvic fluid collection.    She did have a drain placed back in April of 2017 after the ileostomy takedown to resolve a previous fluid collection.  She tells me it remained for 6 months.  Over the last month, she started having some episodes of lower abdominal discomfort with nausea.    CT scan of the abdomen done yesterday in the ED showed recurrence of the pelvic fluid collection.  There is a question of an enteric fistula present.    We are asked to place a drain.  She is NPO. She has history of a  Fem-Fem bypass but is not on blood thinners.  Past Medical History:  Diagnosis Date  . Acute renal failure (ARF) ([place]) 08/07/2015  . Anemia   . Asthma    pt brought her proair inhaler 06-06-14  . Cervical cancer ([place])   . Colon polyp    hyperplastic  . 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 Colonic obstruction ([place])   . Depression   . Family history of adverse reaction to anesthesia    pts mother experiences nausea and vomiting   . GERD (gastroesophageal reflux disease)   . History of bronchitis   . History of chemotherapy   . History of hiatal hernia   . History of radiation therapy   . Hyperlipidemia   . Hypertension   . 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 Migraine    "q couple years maybe" (08/07/2015)  . Shortness of breath dyspnea    seasonal     Past Surgical History:  Procedure Laterality Date  . ABDOMINAL HYSTERECTOMY  1994  . COLON RESECTION N/A 06/27/2015   Procedure: LYSIS OF ADHESIONS LOW ANTERIOR RESECTION DIVERTING LOOP ILEOSTOMY;  Surgeon: Leighton Ruff, MD;  [facility];  Service:  General;  Laterality: N/A;  . COLON SURGERY    . COLONOSCOPY    . CYSTOSCOPY WITH STENT PLACEMENT Bilateral 06/27/2015   Procedure: CYSTOSCOPY WITH CATHETER  PLACEMENT;  Surgeon: Franchot Gallo, MD;  [facility];  Service: Urology;  Laterality: Bilateral;  . DIVERTING ILEOSTOMY N/A 06/27/2015   Procedure: DIVERTING LOOP ILEOSTOMY;  Surgeon: Leighton Ruff, MD;  [facility];  Service: General;  Laterality: N/A;  . FEMORAL-FEMORAL BYPASS GRAFT Bilateral 08/10/2015   Procedure: BYPASS GRAFT LEFT FEMORALTO RIGHT FEMORAL ARTERY;  Surgeon: Rosetta Posner, MD;  Location: [place];  Service: Vascular;  Laterality: Bilateral;  . FLEXIBLE SIGMOIDOSCOPY  12/12/2015   Procedure: FLEXIBLE SIGMOIDOSCOPY;  Surgeon: Leighton Ruff, MD;  [facility];  Service: General;;  . ILEOSTOMY CLOSURE N/A 12/12/2015   Procedure: OPEN ILEOSTOMY REVERSAL AND PELVIC WASHOUT;  Surgeon: Leighton Ruff, MD;  [facility];  Service: General;  Laterality: N/A;  . INTRAOPERATIVE ARTERIOGRAM Right 08/10/2015   Procedure: INTRA OPERATIVE ARTERIOGRAM Right Iliac Artery;  Surgeon: Rosetta Posner, MD;  Location: [place];  Service: Vascular;  Laterality: Right;  . IR GENERIC HISTORICAL  09/12/2015   IR RADIOLOGIST EVAL & MGMT 09/12/2015 Monia Sabal, PA-C [facility] INTERV RAD  . IV antibiotic infusion therapy     . PERIPHERAL VASCULAR CATHETERIZATION N/A 08/09/2015   Procedure: Abdominal Aortogram w/Lower Extremity;  Surgeon: Conrad [place], MD;  Location: [place] [facility];  Service: Cardiovascular;  Laterality: N/A;  . PERIPHERAL VASCULAR CATHETERIZATION Left 08/09/2015   Procedure: Peripheral Vascular Intervention;  Surgeon: Conrad [place], MD;  Location: [place] [facility];  Service: Cardiovascular;  Laterality: Left;  common iliac  . PICC LINE PLACE PERIPHERAL ([place] HX)    . THROMBECTOMY FEMORAL ARTERY Bilateral 09/05/2015   Procedure: THROMBECTOMY Left to Right Femoral to  FEMORAL ARTERY Bypass,.;  Surgeon: Mal Misty, MD;  Location:  [place];  Service: Vascular;  Laterality: Bilateral;  . THROMBECTOMY ILIAC ARTERY Right 08/10/2015   Procedure: THROMBECTOMY Right ILIAC ARTERY;  Surgeon: Rosetta Posner, MD;  Location: [place];  Service: Vascular;  Laterality: Right;  . TUBAL LIGATION    . UPPER GASTROINTESTINAL ENDOSCOPY      Allergies: Codeine  Medications: Prior to Admission medications   Medication Sig Start Date End Date Taking? Authorizing Provider  Probiotic Product (PROBIOTIC PO) Take 1 tablet by mouth daily.    [provider]     Family History  Problem Relation Age of Onset  . Rectal cancer Cousin   . Hypertension Mother   . Hyperparathyroidism Mother   . Hypertension Father   . CAD Father        CABG  . Peripheral vascular disease Father   . Graves' disease Father   . Colon cancer Neg Hx   . Stomach cancer Neg Hx   . Esophageal cancer Neg Hx   . Deep vein thrombosis Neg Hx     Social History   Socioeconomic History  . Marital status: Married    Spouse name: Not on file  . Number of children: 1  . Years of education: Not on file  . Highest education level: Not on file  Occupational History  . Not on file  Social Needs  . Financial resource strain: Patient refused  . Food insecurity:    Worry: Patient refused    Inability: Patient refused  . Transportation needs:    Medical: Patient refused    Non-medical: Patient refused  Tobacco Use  . Smoking status: Current Every Day Smoker    Packs/day: 1.50    Years: 30.00    Pack years: 45.00    Types: Cigarettes  . Smokeless tobacco: Never Used  . Tobacco comment: Less than 10 cigarettes per day  Substance and Sexual Activity  . Alcohol use: No    Alcohol/week: 0.0 standard drinks  . Drug use: No  . Sexual activity: Yes  Lifestyle  . Physical activity:    Days per week: Patient refused    Minutes per session: Patient refused  . Stress: Patient refused  Relationships  . Social connections:    Talks on phone: Patient refused     Gets together: Patient refused    Attends religious service: Patient refused    Active member of club or organization: Patient refused    Attends meetings of clubs or organizations: Patient refused    Relationship status: Patient refused  Other Topics Concern  . Not on file  Social History Narrative  . Not on file     Review of Systems: A 12 point ROS discussed and pertinent positives are indicated in the HPI above.  All other systems are negative.  Review of Systems   Physical Exam Constitutional:      Appearance: Normal appearance.  HENT:     Head: Normocephalic and atraumatic.  Eyes:  Extraocular Movements: Extraocular movements intact.  Neck:     Musculoskeletal: Normal range of motion.  Cardiovascular:     Rate and Rhythm: Normal rate and regular rhythm.  Pulmonary:     Effort: Pulmonary effort is normal. No respiratory distress.     Breath sounds: Normal breath sounds.  Abdominal:     Palpations: Abdomen is soft.     Tenderness: There is abdominal tenderness.  Musculoskeletal: Normal range of motion.  Skin:    General: Skin is warm and dry.  Neurological:     General: No focal deficit present.     Mental Status: She is alert and oriented to person, place, and time.  Psychiatric:        Mood and Affect: Mood normal.        Behavior: Behavior normal.        Thought Content: Thought content normal.        Judgment: Judgment normal.     Imaging: Ct Abdomen Pelvis W Contrast  Result Date: 06/07/2018 CLINICAL DATA:  Vomiting and constipation. EXAM: CT ABDOMEN AND PELVIS WITH CONTRAST TECHNIQUE: Multidetector CT imaging of the abdomen and pelvis was performed using the standard protocol following bolus administration of intravenous contrast. CONTRAST:  160mL ISOVUE-300 IOPAMIDOL (ISOVUE-300) INJECTION 61% COMPARISON:  12/05/2015 FINDINGS: Lower chest: No acute abnormality. Hepatobiliary: Tiny low-density structure in left lobe of liver measuring 4 mm is too small  to reliably characterize. There are multiple small stones within the gallbladder. No gallbladder wall thickening or pericholecystic fluid. No biliary ductal dilatation. Pancreas: Unremarkable. No pancreatic ductal dilatation or surrounding inflammatory changes. Spleen: Normal in size without focal abnormality. Adrenals/Urinary Tract: Normal adrenal glands. The kidneys are unremarkable. No mass or hydronephrosis. The urinary bladder appears normal. Stomach/Bowel: Stomach appears normal. No small bowel dilatation identified. Status post low anterior resection. Interval reversal of previous ileostomy. Vascular/Lymphatic: Aortic atherosclerosis without aneurysm. Porta hepatic lymph node measures 1.3 cm, image 25/2. Previously 1.2 cm. Small left retroperitoneal lymph nodes appear unchanged. Extensive surgical clips are identified within bilateral iliac nodal change. No adenopathy identified. Patent fem-fem bypass. Reproductive: Choose 2. Other: Bilobed fluid collection within the presacral soft tissues and right posterior pelvic sidewall is again noted. Along the right posterior pelvic sidewall fluid collection measures 5.1 x 2.9 cm, image 70/2. Previously 4.4 x 1.5 cm. The component within the presacral region measures 4.2 by 2.0 cm, image 63/2. Previously 3.3 x 1.6 cm. Here, there is accumulation of high attenuation contrast material, image 59/7. There are several loops of small bowel which appear closely associated with this area and may have fistulous communication with the presacral fluid collection, image 64/2. Musculoskeletal: No aggressive lytic or sclerotic bone lesions. IMPRESSION: 1. In no abnormal dilatation of the small or large bowel loops to suggest bowel obstruction. 2. Bilobed fluid collection within the presacral soft tissues and right posterior pelvic sidewall is again noted and appears increased in size from previous exam. High attenuation material within the presacral component is noted which may  reflect underlying fistulous communication with adjacent bowel loops. 3.  Aortic Atherosclerosis (ICD10-I70.0). Electronically Signed   By: Kerby Moors M.D.   On: 06/07/2018 11:23    Labs:  CBC: Recent Labs    06/07/18 0902 06/08/18 0425  WBC 14.5* 10.1  HGB 14.1 12.8  HCT 46.3* 42.8  PLT 194 182    COAGS: Recent Labs    06/07/18 1531  INR 1.02    BMP: Recent Labs    06/07/18 0902 06/08/18  0425  NA 138 140  K 4.2 4.1  CL 105 108  CO2 22 25  GLUCOSE 119* 114*  BUN 14 8  CALCIUM 9.1 8.8*  CREATININE 0.77 0.84  GFRNONAA >60 >60  GFRAA >60 >60    LIVER FUNCTION TESTS: Recent Labs    06/07/18 0902  BILITOT 0.5  AST 13*  ALT 9  ALKPHOS 96  PROT 6.9  ALBUMIN 3.8    TUMOR MARKERS: No results for input(s): AFPTM, CEA, CA199, CHROMGRNA in the last 8760 hours.  Assessment and Plan:  Pelvic fluid collection in the setting of previous bowel surgery (2017).  Will proceed with CT guided drain placement today by Dr. Pascal Lux.  Risks and benefits discussed with the patient including bleeding, infection, damage to adjacent structures, bowel perforation/fistula connection, and sepsis.  All of the patient's questions were answered, patient is agreeable to proceed. Consent signed and in chart.  Thank you for this interesting consult.  I greatly enjoyed meeting Cathy Jones and look forward to participating in their care.  A copy of this report was sent to the requesting provider on this date.  Electronically Signed: Murrell Redden, PA-C   06/08/2018, 9:34 AM      I spent a total of 40 Minutes  in face to face in clinical consultation, greater than 50% of which was counseling/coordinating care for drain placement for pelvic abscess.

## 2018-06-08 NOTE — Consult Note (Signed)
Reason for Consult: Pelvic abscess Referring Physician: Dr. Hetty Ely LATICE WAITMAN is an 58 y.o. female.  HPI: Patient is a 58 year old white female with multiple medical problems, status post low anterior resection with diverting ileostomy, subsequent ileostomy takedown in 2017 who presents with a recurrent pelvic fluid collection.  The patient states that she did have a drain placed after the ileostomy takedown to resolve the fluid collection.  She has not seen the original surgeon for some time now.  Over the last month, she started having some episodes of lower abdominal discomfort with nausea.  She denies any fever or chills.  Her bowel movements have been regular.  A CT scan of the abdomen done yesterday in the emergency room revealed recurrence of the pelvic fluid collection, which is larger than compared to a previous CT scan.  There is a question of whether she has an enteric fistula present.  She denies any diarrhea or constipation.  She currently has 0 out of 10 abdominal pain.  Please see H&P.  Past Medical History:  Diagnosis Date  . Acute renal failure (ARF) ([place]) 08/07/2015  . Anemia   . Asthma    pt brought her proair inhaler 06-06-14  . Cervical cancer ([place])   . Colon polyp    hyperplastic  . 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 Colonic obstruction ([place])   . Depression   . Family history of adverse reaction to anesthesia    pts mother experiences nausea and vomiting   . GERD (gastroesophageal reflux disease)   . History of bronchitis   . History of chemotherapy   . History of hiatal hernia   . History of radiation therapy   . Hyperlipidemia   . Hypertension   . 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 Migraine    "q couple years maybe" (08/07/2015)  . Shortness of breath dyspnea    seasonal     Past Surgical History:  Procedure Laterality Date  . ABDOMINAL HYSTERECTOMY  1994  . COLON RESECTION N/A 06/27/2015   Procedure: LYSIS OF ADHESIONS LOW ANTERIOR RESECTION  DIVERTING LOOP ILEOSTOMY;  Surgeon: Leighton Ruff, MD;  [facility];  Service: General;  Laterality: N/A;  . COLON SURGERY    . COLONOSCOPY    . CYSTOSCOPY WITH STENT PLACEMENT Bilateral 06/27/2015   Procedure: CYSTOSCOPY WITH CATHETER  PLACEMENT;  Surgeon: Franchot Gallo, MD;  [facility];  Service: Urology;  Laterality: Bilateral;  . DIVERTING ILEOSTOMY N/A 06/27/2015   Procedure: DIVERTING LOOP ILEOSTOMY;  Surgeon: Leighton Ruff, MD;  [facility];  Service: General;  Laterality: N/A;  . FEMORAL-FEMORAL BYPASS GRAFT Bilateral 08/10/2015   Procedure: BYPASS GRAFT LEFT FEMORALTO RIGHT FEMORAL ARTERY;  Surgeon: Rosetta Posner, MD;  Location: [place];  Service: Vascular;  Laterality: Bilateral;  . FLEXIBLE SIGMOIDOSCOPY  12/12/2015   Procedure: FLEXIBLE SIGMOIDOSCOPY;  Surgeon: Leighton Ruff, MD;  [facility];  Service: General;;  . ILEOSTOMY CLOSURE N/A 12/12/2015   Procedure: OPEN ILEOSTOMY REVERSAL AND PELVIC WASHOUT;  Surgeon: Leighton Ruff, MD;  [facility];  Service: General;  Laterality: N/A;  . INTRAOPERATIVE ARTERIOGRAM Right 08/10/2015   Procedure: INTRA OPERATIVE ARTERIOGRAM Right Iliac Artery;  Surgeon: Rosetta Posner, MD;  Location: [place];  Service: Vascular;  Laterality: Right;  . IR GENERIC HISTORICAL  09/12/2015   IR RADIOLOGIST EVAL & MGMT 09/12/2015 Monia Sabal, PA-C [facility] INTERV RAD  . IV antibiotic infusion therapy     . PERIPHERAL VASCULAR CATHETERIZATION N/A  08/09/2015   Procedure: Abdominal Aortogram w/Lower Extremity;  Surgeon: Conrad [place], MD;  Location: [place] [facility];  Service: Cardiovascular;  Laterality: N/A;  . PERIPHERAL VASCULAR CATHETERIZATION Left 08/09/2015   Procedure: Peripheral Vascular Intervention;  Surgeon: Conrad [place], MD;  Location: [place] [facility];  Service: Cardiovascular;  Laterality: Left;  common iliac  . PICC LINE PLACE PERIPHERAL ([place] HX)    . THROMBECTOMY FEMORAL ARTERY Bilateral 09/05/2015   Procedure: THROMBECTOMY Left to  Right Femoral to  FEMORAL ARTERY Bypass,.;  Surgeon: Mal Misty, MD;  Location: [place];  Service: Vascular;  Laterality: Bilateral;  . THROMBECTOMY ILIAC ARTERY Right 08/10/2015   Procedure: THROMBECTOMY Right ILIAC ARTERY;  Surgeon: Rosetta Posner, MD;  Location: [place];  Service: Vascular;  Laterality: Right;  . TUBAL LIGATION    . UPPER GASTROINTESTINAL ENDOSCOPY      Family History  Problem Relation Age of Onset  . Rectal cancer Cousin   . Hypertension Mother   . Hyperparathyroidism Mother   . Hypertension Father   . CAD Father        CABG  . Peripheral vascular disease Father   . Graves' disease Father   . Colon cancer Neg Hx   . Stomach cancer Neg Hx   . Esophageal cancer Neg Hx   . Deep vein thrombosis Neg Hx     Social History:  reports that she has been smoking cigarettes. She has a 45.00 pack-year smoking history. She has never used smokeless tobacco. She reports that she does not drink alcohol or use drugs.  Allergies:  Allergies  Allergen Reactions  . Codeine Other (See Comments)    "hyper"    Medications: I have reviewed the patient's current medications.  Results for orders placed or performed during the hospital encounter of 06/07/18 (from the past 48 hour(s))  Lipase, blood     Status: None   Collection Time: 06/07/18  9:02 AM  Result Value Ref Range   Lipase 28 11 - 51 U/L    Comment: Performed at [facility], [address], [place], [place] [address]  Comprehensive metabolic panel     Status: Abnormal   Collection Time: 06/07/18  9:02 AM  Result Value Ref Range   Sodium 138 135 - 145 mmol/L   Potassium 4.2 3.5 - 5.1 mmol/L   Chloride 105 98 - 111 mmol/L   CO2 22 22 - 32 mmol/L   Glucose, Bld 119 (H) 70 - 99 mg/dL   BUN 14 6 - 20 mg/dL   Creatinine, Ser 0.77 0.44 - 1.00 mg/dL   Calcium 9.1 8.9 - 10.3 mg/dL   Total Protein 6.9 6.5 - 8.1 g/dL   Albumin 3.8 3.5 - 5.0 g/dL   AST 13 (L) 15 - 41 U/L   ALT 9 0 - 44 U/L   Alkaline Phosphatase 96 38 -  126 U/L   Total Bilirubin 0.5 0.3 - 1.2 mg/dL   GFR calc non Af Amer >60 >60 mL/min   GFR calc Af Amer >60 >60 mL/min   Anion gap 11 5 - 15    Comment: Performed at [facility], [address], [place], [place] [address]  CBC     Status: Abnormal   Collection Time: 06/07/18  9:02 AM  Result Value Ref Range   WBC 14.5 (H) 4.0 - 10.5 K/uL   RBC 5.27 (H) 3.87 - 5.11 MIL/uL   Hemoglobin 14.1 12.0 - 15.0 g/dL   HCT 46.3 (  H) 36.0 - 46.0 %   MCV 87.9 80.0 - 100.0 fL   MCH 26.8 26.0 - 34.0 pg   MCHC 30.5 30.0 - 36.0 g/dL   RDW 13.7 11.5 - 15.5 %   Platelets 194 150 - 400 K/uL   nRBC 0.0 0.0 - 0.2 %    Comment: Performed at [facility], [address] [place], [place] [address]  Urinalysis, Routine w reflex microscopic     Status: Abnormal   Collection Time: 06/07/18 10:09 AM  Result Value Ref Range   Color, Urine STRAW (A) YELLOW   APPearance CLEAR CLEAR   Specific Gravity, Urine 1.006 1.005 - 1.030   pH 5.0 5.0 - 8.0   Glucose, UA NEGATIVE NEGATIVE mg/dL   Hgb urine dipstick NEGATIVE NEGATIVE   Bilirubin Urine NEGATIVE NEGATIVE   Ketones, ur NEGATIVE NEGATIVE mg/dL   Protein, ur NEGATIVE NEGATIVE mg/dL   Nitrite NEGATIVE NEGATIVE   Leukocytes, UA NEGATIVE NEGATIVE    Comment: Performed at [facility], [address] [place], [place] [address]  HIV antibody (Routine Testing)     Status: None   Collection Time: 06/07/18  2:11 PM  Result Value Ref Range   HIV Screen 4th Generation wRfx Non Reactive Non Reactive    Comment: (NOTE) Performed At: [facility] [place], [place] [id number] Rush Farmer MD PI:[id number]   Protime-INR     Status: None   Collection Time: 06/07/18  3:31 PM  Result Value Ref Range   Prothrombin Time 13.3 11.4 - 15.2 seconds   INR 1.02     Comment: Performed at [facility], [address], [place], [place] [address]  Basic metabolic panel     Status: Abnormal   Collection Time: 06/08/18  4:25 AM  Result Value Ref Range    Sodium 140 135 - 145 mmol/L   Potassium 4.1 3.5 - 5.1 mmol/L   Chloride 108 98 - 111 mmol/L   CO2 25 22 - 32 mmol/L   Glucose, Bld 114 (H) 70 - 99 mg/dL   BUN 8 6 - 20 mg/dL   Creatinine, Ser 0.84 0.44 - 1.00 mg/dL   Calcium 8.8 (L) 8.9 - 10.3 mg/dL   GFR calc non Af Amer >60 >60 mL/min   GFR calc Af Amer >60 >60 mL/min   Anion gap 7 5 - 15    Comment: Performed at [facility], [address], [place], [place] [address]  CBC     Status: Abnormal   Collection Time: 06/08/18  4:25 AM  Result Value Ref Range   WBC 10.1 4.0 - 10.5 K/uL   RBC 4.80 3.87 - 5.11 MIL/uL   Hemoglobin 12.8 12.0 - 15.0 g/dL   HCT 42.8 36.0 - 46.0 %   MCV 89.2 80.0 - 100.0 fL   MCH 26.7 26.0 - 34.0 pg   MCHC 29.9 (L) 30.0 - 36.0 g/dL   RDW 13.8 11.5 - 15.5 %   Platelets 182 150 - 400 K/uL   nRBC 0.0 0.0 - 0.2 %    Comment: Performed at [facility], [address] [place], [place] [address]    Ct Abdomen Pelvis W Contrast  Result Date: 06/07/2018 CLINICAL DATA:  Vomiting and constipation. EXAM: CT ABDOMEN AND PELVIS WITH CONTRAST TECHNIQUE: Multidetector CT imaging of the abdomen and pelvis was performed using the standard protocol following bolus administration of intravenous contrast. CONTRAST:  142mL ISOVUE-300 IOPAMIDOL (ISOVUE-300) INJECTION 61% COMPARISON:  12/05/2015 FINDINGS: Lower chest: No acute abnormality.  Hepatobiliary: Tiny low-density structure in left lobe of liver measuring 4 mm is too small to reliably characterize. There are multiple small stones within the gallbladder. No gallbladder wall thickening or pericholecystic fluid. No biliary ductal dilatation. Pancreas: Unremarkable. No pancreatic ductal dilatation or surrounding inflammatory changes. Spleen: Normal in size without focal abnormality. Adrenals/Urinary Tract: Normal adrenal glands. The kidneys are unremarkable. No mass or hydronephrosis. The urinary bladder appears normal. Stomach/Bowel: Stomach appears normal. No small bowel dilatation  identified. Status post low anterior resection. Interval reversal of previous ileostomy. Vascular/Lymphatic: Aortic atherosclerosis without aneurysm. Porta hepatic lymph node measures 1.3 cm, image 25/2. Previously 1.2 cm. Small left retroperitoneal lymph nodes appear unchanged. Extensive surgical clips are identified within bilateral iliac nodal change. No adenopathy identified. Patent fem-fem bypass. Reproductive: Choose 2. Other: Bilobed fluid collection within the presacral soft tissues and right posterior pelvic sidewall is again noted. Along the right posterior pelvic sidewall fluid collection measures 5.1 x 2.9 cm, image 70/2. Previously 4.4 x 1.5 cm. The component within the presacral region measures 4.2 by 2.0 cm, image 63/2. Previously 3.3 x 1.6 cm. Here, there is accumulation of high attenuation contrast material, image 59/7. There are several loops of small bowel which appear closely associated with this area and may have fistulous communication with the presacral fluid collection, image 64/2. Musculoskeletal: No aggressive lytic or sclerotic bone lesions. IMPRESSION: 1. In no abnormal dilatation of the small or large bowel loops to suggest bowel obstruction. 2. Bilobed fluid collection within the presacral soft tissues and right posterior pelvic sidewall is again noted and appears increased in size from previous exam. High attenuation material within the presacral component is noted which may reflect underlying fistulous communication with adjacent bowel loops. 3.  Aortic Atherosclerosis (ICD10-I70.0). Electronically Signed   By: Kerby Moors M.D.   On: 06/07/2018 11:23    ROS:  Pertinent items are noted in HPI.  Blood pressure 134/63, pulse 62, temperature 98.4 F (36.9 C), temperature source Oral, resp. rate 20, height 5\' 2"  (1.575 m), weight 83.8 kg, SpO2 96 %. Physical Exam: Pleasant well-developed well-nourished white female no acute distress Head is normocephalic, atraumatic Lungs  clear to auscultation with good breath sounds bilaterally Heart examination reveals regular rate and rhythm without S3, S4, murmurs Abdomen is soft, nontender, nondistended.  Multiple well-healed surgical scars present. Rectal exam was deferred at this time.  CT scan images personally reviewed Assessment/Plan: Impression: Recurrent pelvic fluid collection, question abscess, question enteric fistula.  Patient's white count has normalized. Plan: Awaiting results from drainage of the pelvic abscess by interventional radiology.  Further management is pending those results.  Will follow with you.  No need for acute surgical intervention at this time.  Aviva Signs 06/08/2018, 7:46 AM

## 2018-06-08 NOTE — Progress Notes (Addendum)
Patient Demographics:    Cathy Jones, is a 58 y.o. female, DOB - 13-Apr-1961, UKG:[id number]  Admit date - 06/07/2018   Admitting Physician Zamir Staples Denton Brick, MD  Outpatient Primary MD for the patient is Aletha Halim., PA-C  LOS - 1   Chief Complaint  Patient presents with  . Abdominal Pain        Subjective:    Cathy Jones today has no fevers, no emesis,  No chest pain, abdominal pain is improving,  Assessment  & Plan :    Principal Problem:   Pelvic abscess in female Active Problems:   Rectal stricture s/p LAR resection Feb2017   B12 deficiency anemia   Folic acid deficiency anemia   PAD (peripheral artery disease) (HCC)   Tobacco abuse   Brief Summary:- 58 y.o. 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, status post prior revascularization with femorofemoral bypass, history of prior colon stricture, with history of recurrent SBO's with prior ileostomy and subsequent takedown of ileostomy in 2017 admitted on 06/07/2018 with concerns about pelvic abscess, status post CT-guided drainage with drain placement on 06/08/2018, cultures pending     Plan:- 1)Pelvic Fluid collection with Possible Abscess and Possible enteric fistula-- on 06/08/18 pt underwent CT guided placed of a 10 Fr drainage catheter placement into the lower pelvis via right transgluteal approach yielding 25 cc of slightly bloody serous fluid--- Gram stain and culture pending, WBC is down to 10.1 from 14.5, patient is afebrile, continue IV Zosyn started 06/07/2018 pending culture data, consider repeat CT abdomen and pelvis in 2 to 3 days, please coordinate drain management with IR team, surgical.consult from Dr. 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 Arnoldo Morale appreciated  Disposition/Need for in-Hospital Stay- patient unable to be discharged at this time due to patient needs IV antibiotics pending fluid culture results to direct antibiotic choice, will  need repeat CT abdomen and pelvis prior to deciding on whether drain can be pulled  Code Status : Full  Family Communication:   na   Disposition Plan  : Home  Consults  :  gen surgery/IR  DVT Prophylaxis  :  Heparin /SCDs    Lab Results  Component Value Date   PLT 182 06/08/2018    Inpatient Medications  Scheduled Meds: . bisacodyl  10 mg Rectal Once  . senna-docusate  2 tablet Oral BID  . sodium chloride flush  3 mL Intravenous Q12H   Continuous Infusions: . sodium chloride    . dextrose 5 % and 0.45% NaCl 125 mL/hr at 06/08/18 1720  . piperacillin-tazobactam (ZOSYN)  IV 3.375 g (06/08/18 1410)  PRN Meds:.sodium chloride, acetaminophen **OR** acetaminophen, albuterol, morphine injection, ondansetron **OR** ondansetron (ZOFRAN) IV, oxyCODONE, polyethylene glycol, sodium chloride flush, traZODone    Anti-infectives (From admission, onward)   Start     Dose/Rate Route Frequency Ordered Stop   06/07/18 2000  piperacillin-tazobactam (ZOSYN) IVPB 3.375 g     3.375 g 12.5 mL/hr over 240 Minutes Intravenous Every 8 hours 06/07/18 1405     06/07/18 1245  piperacillin-tazobactam (ZOSYN) IVPB 3.375 g     3.375 g 100 mL/hr over 30 Minutes  Intravenous  Once 06/07/18 1233 06/07/18 1351        Objective:   Vitals:   06/07/18 2057 06/07/18 2129 06/08/18 0534 06/08/18 1411  BP:  (!) 115/50 134/63 (!) 129/53  Pulse:  62 62 65  Resp:  20 20 20   Temp:  98.6 F (37 C) 98.4 F (36.9 C)   TempSrc:  Oral Oral   SpO2: 97% 96% 96% 96%  Weight:      Height:        Wt Readings from Last 3 Encounters:  06/07/18 83.8 kg  10/06/17 88.9 kg  09/30/16 90.7 kg     Intake/Output Summary (Last 24 hours) at 06/08/2018 1740 Last data filed at 06/08/2018 1700 Gross per 24 hour  Intake 3348.76 ml  Output 25 ml  Net 3323.76 ml     Physical Exam Patient is examined daily including today on 06/08/18 , exams remain the same as of yesterday except that has changed   Gen:- Awake Alert,  In no apparent distress  HEENT:- [place].AT, No sclera icterus Neck-Supple Neck,No JVD,.  Lungs-  CTAB , fair symmetrical air movement CV- S1, S2 normal, regular  Abd-  +ve B.Sounds, Abd Soft, scars from prior laparotomy noted, lower abdominal tenderness appears to be improving  , no rebound or guarding, Rt sided JP drain with sero-sanguinous drainage with sediments..  Extremity/Skin:- No  edema, pedal pulses present  Psych-affect is appropriate, oriented x3 Neuro-no new focal deficits, no tremors   Data Review:   Micro Results Recent Results (from the past 240 hour(s))  Aerobic/Anaerobic Culture (surgical/deep wound)     Status: None (Preliminary result)   Collection Time: 06/08/18 11:50 AM  Result Value Ref Range Status   Specimen Description ABSCESS ABDOMEN  Final   Special Requests NONE  Final   Gram Stain   Final    ABUNDANT WBC PRESENT,BOTH PMN AND MONONUCLEAR NO ORGANISMS SEEN Performed at [facility], [place] [address] [place], [place] [address]    Culture PENDING  Incomplete   Report Status PENDING  Incomplete    Radiology Reports Ct Abdomen Pelvis W Contrast  Result Date: 06/07/2018 CLINICAL DATA:  Vomiting and constipation.  EXAM: CT ABDOMEN AND PELVIS WITH CONTRAST TECHNIQUE: Multidetector CT imaging of the abdomen and pelvis was performed using the standard protocol following bolus administration of intravenous contrast. CONTRAST:  179mL ISOVUE-300 IOPAMIDOL (ISOVUE-300) INJECTION 61% COMPARISON:  12/05/2015 FINDINGS: Lower chest: No acute abnormality. Hepatobiliary: Tiny low-density structure in left lobe of liver measuring 4 mm is too small to reliably characterize. There are multiple small stones within the gallbladder. No gallbladder wall thickening or pericholecystic fluid. No biliary ductal dilatation. Pancreas: Unremarkable. No pancreatic ductal dilatation or surrounding inflammatory changes. Spleen: Normal in size without focal abnormality. Adrenals/Urinary Tract: Normal adrenal glands. The kidneys are unremarkable. No mass or hydronephrosis. The urinary bladder appears normal. Stomach/Bowel: Stomach appears normal. No small bowel dilatation identified. Status post low anterior resection. Interval reversal of  previous ileostomy. Vascular/Lymphatic: Aortic atherosclerosis without aneurysm. Porta hepatic lymph node measures 1.3 cm, image 25/2. Previously 1.2 cm. Small left retroperitoneal lymph nodes appear unchanged. Extensive surgical clips are identified within bilateral iliac nodal change. No adenopathy identified. Patent fem-fem bypass. Reproductive: Choose 2. Other: Bilobed fluid collection within the presacral soft tissues and right posterior pelvic sidewall is again noted. Along the right posterior pelvic sidewall fluid collection measures 5.1 x 2.9 cm, image 70/2. Previously 4.4 x 1.5 cm. The component within the presacral region measures 4.2 by 2.0 cm, image 63/2. Previously 3.3 x 1.6 cm. Here, there is accumulation of high attenuation contrast material, image 59/7. There are several loops of small bowel which appear closely associated with this area and may have fistulous communication with the presacral fluid  collection, image 64/2. Musculoskeletal: No aggressive lytic or sclerotic bone lesions. IMPRESSION: 1. In no abnormal dilatation of the small or large bowel loops to suggest bowel obstruction. 2. Bilobed fluid collection within the presacral soft tissues and right posterior pelvic sidewall is again noted and appears increased in size from previous exam. High attenuation material within the presacral component is noted which may reflect underlying fistulous communication with adjacent bowel loops. 3.  Aortic Atherosclerosis (ICD10-I70.0). Electronically Signed   By: Kerby Moors M.D.   On: 06/07/2018 11:23   Ct Image Guided Drainage By Percutaneous Catheter  Result Date: 06/08/2018 INDICATION: Remote history of cervical cancer complicated by development distal colonic stricture, post low anterior resection with diverting ileostomy, complicated by the anastomotic leak requiring percutaneous drainage catheter placement on 09/05/2015. Unfortunately, patient re-presented to the hospital with worsening abdominal/pelvic pain with repeat CT scan of the abdomen pelvis performed 06/07/2018 demonstrating interval enlargement of indeterminate fluid collection within the right hemipelvis. Request made for CT-guided aspiration and/or drainage catheter placement for infection source control purposes. EXAM: CT IMAGE GUIDED DRAINAGE BY PERCUTANEOUS CATHETER COMPARISON:  CT abdomen and pelvis-06/07/2018; 12/04/2015; 09/03/2015; CT-guided right trans gluteal approach percutaneous drainage catheter placement-09/05/2015 MEDICATIONS: The patient is currently admitted to the hospital and receiving intravenous antibiotics. The antibiotics were administered within an appropriate time frame prior to the initiation of the procedure. ANESTHESIA/SEDATION: Moderate (conscious) sedation was employed during this procedure. A total of Versed 1.5 mg and Fentanyl 125 mcg was administered intravenously. Moderate Sedation Time: 20 minutes. The  patient's level of consciousness and vital signs were monitored continuously by radiology nursing throughout the procedure under my direct supervision. CONTRAST:  None COMPLICATIONS: None immediate. PROCEDURE: Informed written consent was obtained from the patient after a discussion of the risks, benefits and alternatives to treatment. The patient was placed left lateral decubitus on the CT gantry and a pre procedural CT was performed re-demonstrating the known abscess/fluid collection within the right hemipelvis. Note, the patient was positioned left lateral decubitus as opposed to prone given the presence of her fem-fem bypass graft. The procedure was planned. A timeout was performed prior to the initiation of the procedure. The skin overlying the right buttocks was prepped and draped in the usual sterile fashion. The overlying soft tissues were anesthetized with 1% lidocaine with epinephrine. Appropriate trajectory was planned with the use of a 22 gauge spinal needle. An 18 gauge trocar needle was advanced into the abscess/fluid collection and a short Amplatz super stiff wire was coiled within the collection. Appropriate positioning was confirmed with a limited CT scan. The tract was serially dilated allowing placement of a 10 [place] all-purpose drainage catheter. Appropriate positioning was confirmed with a limited postprocedural CT  scan. Approximately 30 cc of serous, slightly blood tinged fluid was aspirated. The tube was connected to a JP bulb and sutured in place. A dressing was placed. The patient tolerated the procedure well without immediate post procedural complication. IMPRESSION: Successful CT guided placement of a 10 French all purpose drain catheter into the right hemipelvis with aspiration of 30 cc of serous, slightly blood tinged fluid. Samples were sent to the laboratory as requested by the ordering clinical team. Samples were sent for both Gram stain and cytologic analysis. Electronically Signed    By: Sandi Mariscal M.D.   On: 06/08/2018 12:50     CBC Recent Labs  Lab 06/07/18 0902 06/08/18 0425  WBC 14.5* 10.1  HGB 14.1 12.8  HCT 46.3* 42.8  PLT 194 182  MCV 87.9 89.2  MCH 26.8 26.7  MCHC 30.5 29.9*  RDW 13.7 13.8    Chemistries  Recent Labs  Lab 06/07/18 0902 06/08/18 0425  NA 138 140  K 4.2 4.1  CL 105 108  CO2 22 25  GLUCOSE 119* 114*  BUN 14 8  CREATININE 0.77 0.84  CALCIUM 9.1 8.8*  AST 13*  --   ALT 9  --   ALKPHOS 96  --   BILITOT 0.5  --    ------------------------------------------------------------------------------------------------------------------ No results for input(s): CHOL, HDL, LDLCALC, TRIG, CHOLHDL, LDLDIRECT in the last 72 hours.  No results found for: HGBA1C ------------------------------------------------------------------------------------------------------------------ No results for input(s): TSH, T4TOTAL, T3FREE, THYROIDAB in the last 72 hours.  Invalid input(s): FREET3 ------------------------------------------------------------------------------------------------------------------ No results for input(s): VITAMINB12, FOLATE, FERRITIN, TIBC, IRON, RETICCTPCT in the last 72 hours.  Coagulation profile Recent Labs  Lab 06/07/18 1531  INR 1.02    No results for input(s): DDIMER in the last 72 hours.  Cardiac Enzymes No results for input(s): CKMB, TROPONINI, MYOGLOBIN in the last 168 hours.  Invalid input(s): CK ------------------------------------------------------------------------------------------------------------------ No results found for: BNP   Roxan Hockey M.D on 06/08/2018 at 5:40 PM  Go to [email or web address] - for contact info  [facility] - Office  [phone]

## 2018-06-08 NOTE — Procedures (Signed)
Pre procedural Dx: Recurrent pelvic fluid collection Post procedural Dx: Same  Technically successful CT guided placed of a 10 Fr drainage catheter placement into the lower pelvis via right transgluteal approach yielding 25 cc of slightly bloody serous fluid.    All aspirated samples sent to the laboratory for analysis - both gram stain and cytology.  EBL: None  Complications: None immediate  Ronny Bacon, MD Pager #: [phone]

## 2018-06-09 DIAGNOSIS — Z720 Tobacco use: Secondary | ICD-10-CM

## 2018-06-09 MED ORDER — LACTULOSE 10 GM/15ML PO SOLN
20.0000 g | Freq: Three times a day (TID) | ORAL | Status: DC
  Administered 2018-06-09: 20 g via ORAL
  Filled 2018-06-09 (×3): qty 30

## 2018-06-09 NOTE — Progress Notes (Signed)
Subjective: Patient having some buttock soreness at the catheter insertion site.  Otherwise doing well.  Denies any abdominal pain.  Objective: Vital signs in last 24 hours: Temp:  [97.7 F (36.5 C)-98.1 F (36.7 C)] 98.1 F (36.7 C) (01/29 0514) Pulse Rate:  [54-81] 58 (01/29 0514) Resp:  [10-20] 20 (01/29 0514) BP: (94-144)/(47-98) 112/47 (01/29 0514) SpO2:  [93 %-100 %] 93 % (01/29 0514) Last BM Date: (pt unable to state)  Intake/Output from previous day: 01/28 0701 - 01/29 0700 In: 2159.7 [P.O.:480; I.V.:1550; IV Piggyback:129.7] Out: 30 [Drains:30] Intake/Output this shift: No intake/output data recorded.  General appearance: alert, cooperative and no distress GI: soft, non-tender; bowel sounds normal; no masses,  no organomegaly  Lab Results:  Recent Labs    06/07/18 0902 06/08/18 0425  WBC 14.5* 10.1  HGB 14.1 12.8  HCT 46.3* 42.8  PLT 194 182   BMET Recent Labs    06/07/18 0902 06/08/18 0425  NA 138 140  K 4.2 4.1  CL 105 108  CO2 22 25  GLUCOSE 119* 114*  BUN 14 8  CREATININE 0.77 0.84  CALCIUM 9.1 8.8*   PT/INR Recent Labs    06/07/18 1531  LABPROT 13.3  INR 1.02    Studies/Results: Ct Abdomen Pelvis W Contrast  Result Date: 06/07/2018 CLINICAL DATA:  Vomiting and constipation. EXAM: CT ABDOMEN AND PELVIS WITH CONTRAST TECHNIQUE: Multidetector CT imaging of the abdomen and pelvis was performed using the standard protocol following bolus administration of intravenous contrast. CONTRAST:  13mL ISOVUE-300 IOPAMIDOL (ISOVUE-300) INJECTION 61% COMPARISON:  12/05/2015 FINDINGS: Lower chest: No acute abnormality. Hepatobiliary: Tiny low-density structure in left lobe of liver measuring 4 mm is too small to reliably characterize. There are multiple small stones within the gallbladder. No gallbladder wall thickening or pericholecystic fluid. No biliary ductal dilatation. Pancreas: Unremarkable. No pancreatic ductal dilatation or surrounding  inflammatory changes. Spleen: Normal in size without focal abnormality. Adrenals/Urinary Tract: Normal adrenal glands. The kidneys are unremarkable. No mass or hydronephrosis. The urinary bladder appears normal. Stomach/Bowel: Stomach appears normal. No small bowel dilatation identified. Status post low anterior resection. Interval reversal of previous ileostomy. Vascular/Lymphatic: Aortic atherosclerosis without aneurysm. Porta hepatic lymph node measures 1.3 cm, image 25/2. Previously 1.2 cm. Small left retroperitoneal lymph nodes appear unchanged. Extensive surgical clips are identified within bilateral iliac nodal change. No adenopathy identified. Patent fem-fem bypass. Reproductive: Choose 2. Other: Bilobed fluid collection within the presacral soft tissues and right posterior pelvic sidewall is again noted. Along the right posterior pelvic sidewall fluid collection measures 5.1 x 2.9 cm, image 70/2. Previously 4.4 x 1.5 cm. The component within the presacral region measures 4.2 by 2.0 cm, image 63/2. Previously 3.3 x 1.6 cm. Here, there is accumulation of high attenuation contrast material, image 59/7. There are several loops of small bowel which appear closely associated with this area and may have fistulous communication with the presacral fluid collection, image 64/2. Musculoskeletal: No aggressive lytic or sclerotic bone lesions. IMPRESSION: 1. In no abnormal dilatation of the small or large bowel loops to suggest bowel obstruction. 2. Bilobed fluid collection within the presacral soft tissues and right posterior pelvic sidewall is again noted and appears increased in size from previous exam. High attenuation material within the presacral component is noted which may reflect underlying fistulous communication with adjacent bowel loops. 3.  Aortic Atherosclerosis (ICD10-I70.0). Electronically Signed   By: Kerby Moors M.D.   On: 06/07/2018 11:23   Ct Image Guided Drainage By Percutaneous  Catheter  Result Date: 06/08/2018 INDICATION: Remote history of cervical cancer complicated by development distal colonic stricture, post low anterior resection with diverting ileostomy, complicated by the anastomotic leak requiring percutaneous drainage catheter placement on 09/05/2015. Unfortunately, patient re-presented to the hospital with worsening abdominal/pelvic pain with repeat CT scan of the abdomen pelvis performed 06/07/2018 demonstrating interval enlargement of indeterminate fluid collection within the right hemipelvis. Request made for CT-guided aspiration and/or drainage catheter placement for infection source control purposes. EXAM: CT IMAGE GUIDED DRAINAGE BY PERCUTANEOUS CATHETER COMPARISON:  CT abdomen and pelvis-06/07/2018; 12/04/2015; 09/03/2015; CT-guided right trans gluteal approach percutaneous drainage catheter placement-09/05/2015 MEDICATIONS: The patient is currently admitted to the hospital and receiving intravenous antibiotics. The antibiotics were administered within an appropriate time frame prior to the initiation of the procedure. ANESTHESIA/SEDATION: Moderate (conscious) sedation was employed during this procedure. A total of Versed 1.5 mg and Fentanyl 125 mcg was administered intravenously. Moderate Sedation Time: 20 minutes. The patient's level of consciousness and vital signs were monitored continuously by radiology nursing throughout the procedure under my direct supervision. CONTRAST:  None COMPLICATIONS: None immediate. PROCEDURE: Informed written consent was obtained from the patient after a discussion of the risks, benefits and alternatives to treatment. The patient was placed left lateral decubitus on the CT gantry and a pre procedural CT was performed re-demonstrating the known abscess/fluid collection within the right hemipelvis. Note, the patient was positioned left lateral decubitus as opposed to prone given the presence of her fem-fem bypass graft. The procedure was  planned. A timeout was performed prior to the initiation of the procedure. The skin overlying the right buttocks was prepped and draped in the usual sterile fashion. The overlying soft tissues were anesthetized with 1% lidocaine with epinephrine. Appropriate trajectory was planned with the use of a 22 gauge spinal needle. An 18 gauge trocar needle was advanced into the abscess/fluid collection and a short Amplatz super stiff wire was coiled within the collection. Appropriate positioning was confirmed with a limited CT scan. The tract was serially dilated allowing placement of a 10 [place] all-purpose drainage catheter. Appropriate positioning was confirmed with a limited postprocedural CT scan. Approximately 30 cc of serous, slightly blood tinged fluid was aspirated. The tube was connected to a JP bulb and sutured in place. A dressing was placed. The patient tolerated the procedure well without immediate post procedural complication. IMPRESSION: Successful CT guided placement of a 10 French all purpose drain catheter into the right hemipelvis with aspiration of 30 cc of serous, slightly blood tinged fluid. Samples were sent to the laboratory as requested by the ordering clinical team. Samples were sent for both Gram stain and cytologic analysis. Electronically Signed   By: Sandi Mariscal M.D.   On: 06/08/2018 12:50    Anti-infectives: Anti-infectives (From admission, onward)   Start     Dose/Rate Route Frequency Ordered Stop   06/07/18 2000  piperacillin-tazobactam (ZOSYN) IVPB 3.375 g     3.375 g 12.5 mL/hr over 240 Minutes Intravenous Every 8 hours 06/07/18 1405     06/07/18 1245  piperacillin-tazobactam (ZOSYN) IVPB 3.375 g     3.375 g 100 mL/hr over 30 Minutes Intravenous  Once 06/07/18 1233 06/07/18 1351      Assessment/Plan: Impression: Status post IR percutaneous drainage of pelvic abscess.  Cultures are pending.  Gram stain showed multiple white blood cells.  Her leukocytosis has resolved.  This  does not appear to be consistent with an enteric fistula.  A follow-up CAT scan will be performed in 2  to 3 days.  IR will manage the catheter.  Will discharge home on oral antibiotics based on culture results.  Will follow peripherally with you.  LOS: 2 days    Aviva Signs 06/09/2018

## 2018-06-09 NOTE — Care Management Note (Signed)
Case Management Note  Patient Details  Name: Cathy Jones MRN: [id number] Date of Birth: July 22, 1960  Subjective/Objective:   Pelvic abscess. Drain placed 1/28. From home with husband. Independent. No assistive devices. Has PCP. Still drives. No insurance. Currently does not take any medications.   Discussed Good RX and MATCH program if needed.                Action/Plan: CM following for medication assistance needs at time of DC.   Expected Discharge Date:  06/09/18               Expected Discharge Plan:  Home/Self Care  In-House Referral:     Discharge planning Services  CM Consult  Post Acute Care Choice:  NA Choice offered to:  NA  DME Arranged:    DME Agency:     HH Arranged:    HH Agency:     Status of Service:  In process, will continue to follow  If discussed at Long Length of Stay Meetings, dates discussed:    Additional Comments:  Bryant Saye, Chauncey Reading, RN 06/09/2018, 1:11 PM

## 2018-06-09 NOTE — Progress Notes (Signed)
PROGRESS NOTE  Cathy Jones:[id number] DOB: 1960-07-27 DOA: 06/07/2018 PCP: Aletha Halim., PA-C  Brief History:   58 y.o. 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, status post prior revascularization with femorofemoral bypass, history of prior colon stricture, with history of recurrent SBO's with prior ileostomy and subsequent takedown of ileostomy in 2017, who presents to the ED with abdominal pain, nausea, vomiting and constipation of 3 to 4 days duration  Assessment/Plan: Pelvic abscess -06/07/2018 CT abdomen/pelvis--bilobed fluid collection in the presacral soft tissues and posterior pelvic sidewall -Appreciate general surgery follow-up--does not feel the patient has a fistulous tract -06/08/2018--IR drainage--follow cultures -Preliminary culture gram-negative rod -CT guided placed of a 10 Fr drainage catheter placement into thelower pelvis via right transgluteal approachyielding 25 cc of slightly bloody serous fluid -Continue Zosyn pending culture data  Peripheral arterial disease -Priorthrombus in her supraceliac aortawith spontaneous embolization of the spleen and Rtlower extremity,status post priorrevascularization with femorofemoral bypass,aspirin and Lipitor as ordered  Tobacco abuse -Tobacco cessation discussed     Disposition Plan:   Home in 1-2 days  Family Communication:  no Family at bedside  Consultants: General surgery, IR  Code Status:  FULL   DVT Prophylaxis:  SCDs   Procedures: As Listed in Progress Note Above  Antibiotics: Zosyn 1/27>>>      Subjective: Patient states that abdominal pain is improved but she has some gluteal pain at the site of the drainage.  She denies any fevers, chills, chest pain backslash breath, nausea, vomiting, diarrhea, abdominal  pain.  There is no dysuria hematuria but there is no medication or melena.  Objective: Vitals:   06/08/18 0534 06/08/18 1411 06/08/18 2024 06/08/18 2145  BP: 134/63 (!) 129/53  (!) 125/54  Pulse: 62 65  64  Resp: 20 20  20   Temp: 98.4 F (36.9 C)   97.7 F (36.5 C)  TempSrc: Oral   Oral  SpO2: 96% 96% 96% 96%  Weight:      Height:        Intake/Output Summary (Last 24 hours) at 06/09/2018 1756 Last data filed at 06/09/2018 0500 Gross per 24 hour  Intake 890 ml  Output 5 ml  Net 885 ml   Weight change:  Exam:   General:  Pt is alert, follows commands appropriately, not in acute distress  HEENT: No icterus, No thrush, No neck mass, [place]/AT  Cardiovascular: RRR, S1/S2, no rubs, no gallops  Respiratory: CTA bilaterally, no wheezing, no crackles, no rhonchi  Abdomen: Soft/+BS, non tender, non distended, no guarding  Extremities: No edema, No lymphangitis, No petechiae, No rashes, no synovitis   Data Reviewed: I have personally reviewed following labs and imaging studies Basic Metabolic Panel: Recent Labs  Lab 06/07/18 0902 06/08/18 0425  NA 138 140  K 4.2 4.1  CL 105 108  CO2 22 25  GLUCOSE 119* 114*  BUN 14 8  CREATININE 0.77 0.84  CALCIUM 9.1 8.8*   Liver Function Tests: Recent Labs  Lab 06/07/18 0902  AST 13*  ALT 9  ALKPHOS 96  BILITOT 0.5  PROT 6.9  ALBUMIN 3.8   Recent Labs  Lab 06/07/18 0902  LIPASE 28   No results for input(s): AMMONIA in the last 168 hours. Coagulation Profile: Recent Labs  Lab 06/07/18 1531  INR 1.02   CBC: Recent Labs  Lab 06/07/18 0902 06/08/18 0425  WBC 14.5* 10.1  HGB 14.1 12.8  HCT 46.3* 42.8  MCV 87.9 89.2  PLT 194 182   Cardiac Enzymes: No results for input(s): CKTOTAL, CKMB, CKMBINDEX, TROPONINI in the last 168 hours. BNP: Invalid input(s): POCBNP CBG: No results for input(s): GLUCAP in the last 168 hours. HbA1C: No results for input(s): HGBA1C in the last 72 hours. Urine analysis:      Component Value Date/Time   COLORURINE STRAW (A) 06/07/2018 1009   APPEARANCEUR CLEAR 06/07/2018 1009   LABSPEC 1.006 06/07/2018 1009   PHURINE 5.0 06/07/2018 1009   GLUCOSEU NEGATIVE 06/07/2018 1009   HGBUR NEGATIVE 06/07/2018 1009   [place] 06/07/2018 1009   [place] 06/07/2018 1009   PROTEINUR NEGATIVE 06/07/2018 1009   NITRITE NEGATIVE 06/07/2018 1009   LEUKOCYTESUR NEGATIVE 06/07/2018 1009   Sepsis Labs: @LABRCNTIP (procalcitonin:4,lacticidven:4) ) Recent Results (from the past 240 hour(s))  Aerobic/Anaerobic Culture (surgical/deep wound)     Status: None (Preliminary result)   Collection Time: 06/08/18 11:50 AM  Result Value Ref Range Status   Specimen Description ABSCESS ABDOMEN  Final   Special Requests NONE  Final   Gram Stain   Final    ABUNDANT WBC PRESENT,BOTH PMN AND MONONUCLEAR NO ORGANISMS SEEN    Culture   Final    RARE GRAM NEGATIVE RODS IDENTIFICATION AND SUSCEPTIBILITIES TO FOLLOW Performed at [facility], [place] [address], [place], [place] [address]    Report Status PENDING  Incomplete     Scheduled Meds: . bisacodyl  10 mg Rectal Once  . heparin injection (subcutaneous)  5,000 Units Subcutaneous Q8H  . lactulose  20 g Oral TID  . senna-docusate  2 tablet Oral BID  . sodium chloride flush  3 mL Intravenous Q12H   Continuous Infusions: . sodium chloride    . dextrose 5 % and 0.45% NaCl 50 mL/hr at 06/08/18 1756  . piperacillin-tazobactam (ZOSYN)  IV 3.375 g (06/09/18 1213)    Procedures/Studies: Ct Abdomen Pelvis W Contrast  Result Date: 06/07/2018 CLINICAL DATA:  Vomiting and constipation. EXAM: CT ABDOMEN AND PELVIS WITH CONTRAST TECHNIQUE: Multidetector CT imaging of the abdomen and pelvis was performed using the standard protocol following bolus administration of intravenous contrast. CONTRAST:  176mL ISOVUE-300 IOPAMIDOL (ISOVUE-300) INJECTION 61% COMPARISON:  12/05/2015 FINDINGS: Lower chest: No acute abnormality.  Hepatobiliary: Tiny low-density structure in left lobe of liver measuring 4 mm is too small to reliably characterize. There are multiple small stones within the gallbladder. No gallbladder wall thickening or pericholecystic fluid. No biliary ductal dilatation. Pancreas: Unremarkable. No pancreatic ductal dilatation or surrounding inflammatory changes. Spleen: Normal in size without focal abnormality. Adrenals/Urinary Tract: Normal adrenal glands. The kidneys are unremarkable. No mass or hydronephrosis. The urinary bladder appears normal. Stomach/Bowel: Stomach appears normal. No small bowel dilatation identified. Status post low anterior resection. Interval reversal of previous ileostomy. Vascular/Lymphatic: Aortic atherosclerosis without aneurysm. Porta hepatic lymph node measures 1.3 cm, image 25/2. Previously 1.2 cm. Small left retroperitoneal lymph nodes appear unchanged. Extensive surgical clips are identified within bilateral iliac nodal change. No adenopathy identified. Patent fem-fem bypass. Reproductive: Choose 2. Other: Bilobed fluid collection within the presacral soft tissues and right posterior pelvic sidewall is again noted. Along the right posterior pelvic sidewall fluid collection measures 5.1 x 2.9 cm, image 70/2. Previously 4.4 x 1.5 cm. The component within the presacral region measures 4.2 by 2.0 cm, image 63/2. Previously 3.3 x 1.6  cm. Here, there is accumulation of high attenuation contrast material, image 59/7. There are several loops of small bowel which appear closely associated with this area and may have fistulous communication with the presacral fluid collection, image 64/2. Musculoskeletal: No aggressive lytic or sclerotic bone lesions. IMPRESSION: 1. In no abnormal dilatation of the small or large bowel loops to suggest bowel obstruction. 2. Bilobed fluid collection within the presacral soft tissues and right posterior pelvic sidewall is again noted and appears increased in size from  previous exam. High attenuation material within the presacral component is noted which may reflect underlying fistulous communication with adjacent bowel loops. 3.  Aortic Atherosclerosis (ICD10-I70.0). Electronically Signed   By: Kerby Moors M.D.   On: 06/07/2018 11:23   Ct Image Guided Drainage By Percutaneous Catheter  Result Date: 06/08/2018 INDICATION: Remote history of cervical cancer complicated by development distal colonic stricture, post low anterior resection with diverting ileostomy, complicated by the anastomotic leak requiring percutaneous drainage catheter placement on 09/05/2015. Unfortunately, patient re-presented to the hospital with worsening abdominal/pelvic pain with repeat CT scan of the abdomen pelvis performed 06/07/2018 demonstrating interval enlargement of indeterminate fluid collection within the right hemipelvis. Request made for CT-guided aspiration and/or drainage catheter placement for infection source control purposes. EXAM: CT IMAGE GUIDED DRAINAGE BY PERCUTANEOUS CATHETER COMPARISON:  CT abdomen and pelvis-06/07/2018; 12/04/2015; 09/03/2015; CT-guided right trans gluteal approach percutaneous drainage catheter placement-09/05/2015 MEDICATIONS: The patient is currently admitted to the hospital and receiving intravenous antibiotics. The antibiotics were administered within an appropriate time frame prior to the initiation of the procedure. ANESTHESIA/SEDATION: Moderate (conscious) sedation was employed during this procedure. A total of Versed 1.5 mg and Fentanyl 125 mcg was administered intravenously. Moderate Sedation Time: 20 minutes. The patient's level of consciousness and vital signs were monitored continuously by radiology nursing throughout the procedure under my direct supervision. CONTRAST:  None COMPLICATIONS: None immediate. PROCEDURE: Informed written consent was obtained from the patient after a discussion of the risks, benefits and alternatives to treatment. The  patient was placed left lateral decubitus on the CT gantry and a pre procedural CT was performed re-demonstrating the known abscess/fluid collection within the right hemipelvis. Note, the patient was positioned left lateral decubitus as opposed to prone given the presence of her fem-fem bypass graft. The procedure was planned. A timeout was performed prior to the initiation of the procedure. The skin overlying the right buttocks was prepped and draped in the usual sterile fashion. The overlying soft tissues were anesthetized with 1% lidocaine with epinephrine. Appropriate trajectory was planned with the use of a 22 gauge spinal needle. An 18 gauge trocar needle was advanced into the abscess/fluid collection and a short Amplatz super stiff wire was coiled within the collection. Appropriate positioning was confirmed with a limited CT scan. The tract was serially dilated allowing placement of a 10 [place] all-purpose drainage catheter. Appropriate positioning was confirmed with a limited postprocedural CT scan. Approximately 30 cc of serous, slightly blood tinged fluid was aspirated. The tube was connected to a JP bulb and sutured in place. A dressing was placed. The patient tolerated the procedure well without immediate post procedural complication. IMPRESSION: Successful CT guided placement of a 10 French all purpose drain catheter into the right hemipelvis with aspiration of 30 cc of serous, slightly blood tinged fluid. Samples were sent to the laboratory as requested by the ordering clinical team. Samples were sent for both Gram stain and cytologic analysis. Electronically Signed   By: Eldridge Abrahams.D.  On: 06/08/2018 12:50    Orson Eva, DO  [facility] Pager [phone]  If 7PM-7AM, please contact night-coverage [email or web address] Password [email or web address] 06/09/2018, 5:56 PM   LOS: 2 days

## 2018-06-10 DIAGNOSIS — I739 Peripheral vascular disease, unspecified: Secondary | ICD-10-CM

## 2018-06-10 LAB — CBC
HCT: 41.8 % (ref 36.0–46.0)
Hemoglobin: 12.6 g/dL (ref 12.0–15.0)
MCH: 26.8 pg (ref 26.0–34.0)
MCHC: 30.1 g/dL (ref 30.0–36.0)
MCV: 88.9 fL (ref 80.0–100.0)
PLATELETS: 159 10*3/uL (ref 150–400)
RBC: 4.7 MIL/uL (ref 3.87–5.11)
RDW: 13.4 % (ref 11.5–15.5)
WBC: 7.3 10*3/uL (ref 4.0–10.5)
nRBC: 0 % (ref 0.0–0.2)

## 2018-06-10 LAB — BASIC METABOLIC PANEL
Anion gap: 6 (ref 5–15)
BUN: 8 mg/dL (ref 6–20)
CALCIUM: 8.6 mg/dL — AB (ref 8.9–10.3)
CO2: 22 mmol/L (ref 22–32)
CREATININE: 0.73 mg/dL (ref 0.44–1.00)
Chloride: 111 mmol/L (ref 98–111)
GFR calc Af Amer: >60 mL/min (ref >60)
GFR calc non Af Amer: >60 mL/min (ref >60)
Glucose, Bld: 101 mg/dL — ABNORMAL HIGH (ref 70–99)
Potassium: 3.8 mmol/L (ref 3.5–5.1)
Sodium: 139 mmol/L (ref 135–145)

## 2018-06-10 MED ORDER — SODIUM CHLORIDE 0.9 % IV SOLN
2.0000 g | INTRAVENOUS | Status: DC
  Administered 2018-06-10: 2 g via INTRAVENOUS
  Filled 2018-06-10 (×2): qty 20
  Filled 2018-06-10: qty 2
  Filled 2018-06-10: qty 20

## 2018-06-10 MED ORDER — METRONIDAZOLE IN NACL 5-0.79 MG/ML-% IV SOLN
500.0000 mg | Freq: Three times a day (TID) | INTRAVENOUS | Status: DC
  Administered 2018-06-10 – 2018-06-11 (×3): 500 mg via INTRAVENOUS
  Filled 2018-06-10 (×3): qty 100

## 2018-06-10 NOTE — Progress Notes (Signed)
Subjective: No specific complaints.  Objective: Vital signs in last 24 hours: Temp:  [98.3 F (36.8 C)-98.4 F (36.9 C)] 98.3 F (36.8 C) (01/30 0531) Pulse Rate:  [58-69] 62 (01/30 0531) Resp:  [16-20] 16 (01/30 0531) BP: (110-127)/(54-78) 116/56 (01/30 0531) SpO2:  [96 %-97 %] 97 % (01/30 0531) Last BM Date: 06/09/18  Intake/Output from previous day: 01/29 0701 - 01/30 0700 In: 1095.2 [I.V.:995.2; IV Piggyback:100] Out: 10 [Drains:10] Intake/Output this shift: No intake/output data recorded.  General appearance: alert, cooperative and no distress GI: soft, non-tender; bowel sounds normal; no masses,  no organomegaly and JP drainage with mild serosanguineous drainage  Lab Results:  Recent Labs    06/08/18 0425 06/10/18 0439  WBC 10.1 7.3  HGB 12.8 12.6  HCT 42.8 41.8  PLT 182 159   BMET Recent Labs    06/08/18 0425 06/10/18 0439  NA 140 139  K 4.1 3.8  CL 108 111  CO2 25 22  GLUCOSE 114* 101*  BUN 8 8  CREATININE 0.84 0.73  CALCIUM 8.8* 8.6*   PT/INR Recent Labs    06/07/18 1531  LABPROT 13.3  INR 1.02    Studies/Results: Ct Image Guided Drainage By Percutaneous Catheter  Result Date: 06/08/2018 INDICATION: Remote history of cervical cancer complicated by development distal colonic stricture, post low anterior resection with diverting ileostomy, complicated by the anastomotic leak requiring percutaneous drainage catheter placement on 09/05/2015. Unfortunately, patient re-presented to the hospital with worsening abdominal/pelvic pain with repeat CT scan of the abdomen pelvis performed 06/07/2018 demonstrating interval enlargement of indeterminate fluid collection within the right hemipelvis. Request made for CT-guided aspiration and/or drainage catheter placement for infection source control purposes. EXAM: CT IMAGE GUIDED DRAINAGE BY PERCUTANEOUS CATHETER COMPARISON:  CT abdomen and pelvis-06/07/2018; 12/04/2015; 09/03/2015; CT-guided right trans  gluteal approach percutaneous drainage catheter placement-09/05/2015 MEDICATIONS: The patient is currently admitted to the hospital and receiving intravenous antibiotics. The antibiotics were administered within an appropriate time frame prior to the initiation of the procedure. ANESTHESIA/SEDATION: Moderate (conscious) sedation was employed during this procedure. A total of Versed 1.5 mg and Fentanyl 125 mcg was administered intravenously. Moderate Sedation Time: 20 minutes. The patient's level of consciousness and vital signs were monitored continuously by radiology nursing throughout the procedure under my direct supervision. CONTRAST:  None COMPLICATIONS: None immediate. PROCEDURE: Informed written consent was obtained from the patient after a discussion of the risks, benefits and alternatives to treatment. The patient was placed left lateral decubitus on the CT gantry and a pre procedural CT was performed re-demonstrating the known abscess/fluid collection within the right hemipelvis. Note, the patient was positioned left lateral decubitus as opposed to prone given the presence of her fem-fem bypass graft. The procedure was planned. A timeout was performed prior to the initiation of the procedure. The skin overlying the right buttocks was prepped and draped in the usual sterile fashion. The overlying soft tissues were anesthetized with 1% lidocaine with epinephrine. Appropriate trajectory was planned with the use of a 22 gauge spinal needle. An 18 gauge trocar needle was advanced into the abscess/fluid collection and a short Amplatz super stiff wire was coiled within the collection. Appropriate positioning was confirmed with a limited CT scan. The tract was serially dilated allowing placement of a 10 [place] all-purpose drainage catheter. Appropriate positioning was confirmed with a limited postprocedural CT scan. Approximately 30 cc of serous, slightly blood tinged fluid was aspirated. The tube was connected to  a JP bulb and sutured in place. A  dressing was placed. The patient tolerated the procedure well without immediate post procedural complication. IMPRESSION: Successful CT guided placement of a 10 French all purpose drain catheter into the right hemipelvis with aspiration of 30 cc of serous, slightly blood tinged fluid. Samples were sent to the laboratory as requested by the ordering clinical team. Samples were sent for both Gram stain and cytologic analysis. Electronically Signed   By: Sandi Mariscal M.D.   On: 06/08/2018 12:50    Anti-infectives: Anti-infectives (From admission, onward)   Start     Dose/Rate Route Frequency Ordered Stop   06/07/18 2000  piperacillin-tazobactam (ZOSYN) IVPB 3.375 g     3.375 g 12.5 mL/hr over 240 Minutes Intravenous Every 8 hours 06/07/18 1405     06/07/18 1245  piperacillin-tazobactam (ZOSYN) IVPB 3.375 g     3.375 g 100 mL/hr over 30 Minutes Intravenous  Once 06/07/18 1233 06/07/18 1351      Assessment/Plan: Impression: Doing well after percutaneous drainage of pelvic abscess.  Cultures show rare gram-negative.  Final ID pending.  For repeat CT scan in the next 2 days.  LOS: 3 days    [facility] Signs 06/10/2018

## 2018-06-10 NOTE — Progress Notes (Signed)
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,status post priorrevascularization with femorofemoral bypass,history of prior colon stricture,with history of recurrent SBO's with prior ileostomy and subsequent takedown of ileostomy in 2017, who presents to the ED with abdominal pain,nausea, vomiting and constipation of 3 to 4 days duration  Assessment/Plan: Pelvic abscess -06/07/2018 CT abdomen/pelvis--bilobed fluid collection in the presacral soft tissues and posterior pelvic sidewall -Appreciate general surgery follow-up--does not feel the patient has a fistulous tract -06/08/2018--IR drainage--EColi -CT guided placed of a 10 Fr drainage catheter placement into thelower pelvis via right transgluteal approachyielding 25 cc of slightly bloody serous fluid -change zosyn to ceftriaxone and flagyl -case discussed with general surgery, Dr. 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  She still has some  mild gluteal pain but this is improving.  She denies any fevers, chills, chest pain, shortness breath, cough no hemoptysis, diarrhea, hematochezia, melena.  Objective: Vitals:   06/08/18 2145 06/09/18 1938 06/09/18 2139 06/10/18 0801  BP: (!) 125/54  (!) 110/54 (!) 133/57  Pulse: 64  (!) 58 68  Resp: 20  16 14   Temp: 97.7 F (36.5 C)  98.4 F (36.9 C) 98.4 F (36.9 C)  TempSrc: Oral  Oral Oral  SpO2: 96% 97% 96% 98%  Weight:      Height:        Intake/Output Summary (Last 24 hours) at 06/10/2018 1802 Last data filed at 06/10/2018 1300 Gross per 24 hour  Intake 1575.23 ml  Output 10 ml  Net 1565.23 ml   Weight change:  Exam:   General:  Pt is alert, follows commands appropriately, not in acute distress  HEENT: No icterus, No thrush, No neck mass, [place]/AT  Cardiovascular: RRR, S1/S2, no rubs, no gallops  Respiratory: CTA bilaterally, no wheezing, no crackles, no rhonchi  Abdomen: Soft/+BS, non tender, non distended, no guarding  Extremities: No edema, No lymphangitis, No petechiae, No rashes, no synovitis   Data Reviewed: I have personally reviewed following labs and imaging studies Basic Metabolic Panel: Recent Labs  Lab 06/07/18 0902 06/08/18 0425 06/10/18 0439  NA 138 140 139  K 4.2 4.1 3.8  CL 105 108 111  CO2 22 25 22   GLUCOSE 119* 114* 101*  BUN 14 8 8   CREATININE 0.77 0.84 0.73  CALCIUM 9.1 8.8* 8.6*   Liver Function Tests: Recent Labs  Lab 06/07/18 0902  AST 13*  ALT 9  ALKPHOS 96  BILITOT 0.5  PROT 6.9  ALBUMIN 3.8   Recent Labs  Lab 06/07/18 0902  LIPASE 28   No results for input(s): AMMONIA in the last 168  hours. Coagulation Profile: Recent Labs  Lab 06/07/18 1531  INR 1.02   CBC: Recent Labs  Lab 06/07/18 0902 06/08/18 0425 06/10/18 0439  WBC 14.5* 10.1 7.3  HGB 14.1 12.8 12.6  HCT 46.3* 42.8 41.8  MCV 87.9 89.2 88.9  PLT 194 182 159   Cardiac Enzymes: No results for input(s): CKTOTAL, CKMB, CKMBINDEX, TROPONINI in  the last 168 hours. BNP: Invalid input(s): POCBNP CBG: No results for input(s): GLUCAP in the last 168 hours. HbA1C: No results for input(s): HGBA1C in the last 72 hours. Urine analysis:    Component Value Date/Time   COLORURINE STRAW (A) 06/07/2018 1009   APPEARANCEUR CLEAR 06/07/2018 1009   LABSPEC 1.006 06/07/2018 1009   PHURINE 5.0 06/07/2018 1009   GLUCOSEU NEGATIVE 06/07/2018 1009   HGBUR NEGATIVE 06/07/2018 1009   [place] 06/07/2018 1009   [place] 06/07/2018 1009   PROTEINUR NEGATIVE 06/07/2018 1009   NITRITE NEGATIVE 06/07/2018 1009   LEUKOCYTESUR NEGATIVE 06/07/2018 1009   Sepsis Labs: @LABRCNTIP (procalcitonin:4,lacticidven:4) ) Recent Results (from the past 240 hour(s))  Aerobic/Anaerobic Culture (surgical/deep wound)     Status: None (Preliminary result)   Collection Time: 06/08/18 11:50 AM  Result Value Ref Range Status   Specimen Description ABSCESS ABDOMEN  Final   Special Requests NONE  Final   Gram Stain   Final    ABUNDANT WBC PRESENT,BOTH PMN AND MONONUCLEAR NO ORGANISMS SEEN Performed at [facility], [place] [address] [place], [place] [address]    Culture   Final    RARE ESCHERICHIA COLI NO ANAEROBES ISOLATED; CULTURE IN PROGRESS FOR 5 DAYS    Report Status PENDING  Incomplete   Organism ID, Bacteria ESCHERICHIA COLI  Final      Susceptibility   Escherichia coli - MIC*    AMPICILLIN >=32 RESISTANT Resistant     CEFAZOLIN <=4 SENSITIVE Sensitive     CEFEPIME <=1 SENSITIVE Sensitive     CEFTAZIDIME <=1 SENSITIVE Sensitive     CEFTRIAXONE <=1 SENSITIVE Sensitive     CIPROFLOXACIN <=0.25 SENSITIVE Sensitive     GENTAMICIN <=1 SENSITIVE Sensitive     IMIPENEM <=0.25 SENSITIVE Sensitive     TRIMETH/SULFA <=20 SENSITIVE Sensitive     AMPICILLIN/SULBACTAM 16 INTERMEDIATE Intermediate     PIP/TAZO <=4 SENSITIVE Sensitive     Extended ESBL NEGATIVE Sensitive     * RARE ESCHERICHIA COLI     Scheduled Meds: . bisacodyl   10 mg Rectal Once  . heparin injection (subcutaneous)  5,000 Units Subcutaneous Q8H  . lactulose  20 g Oral TID  . senna-docusate  2 tablet Oral BID  . sodium chloride flush  3 mL Intravenous Q12H   Continuous Infusions: . sodium chloride    . cefTRIAXone (ROCEPHIN)  IV    . dextrose 5 % and 0.45% NaCl 50 mL/hr at 06/08/18 1756  . metronidazole      Procedures/Studies: Ct Abdomen Pelvis W Contrast  Result Date: 06/07/2018 CLINICAL DATA:  Vomiting and constipation. EXAM: CT ABDOMEN AND PELVIS WITH CONTRAST TECHNIQUE: Multidetector CT imaging of the abdomen and pelvis was performed using the standard protocol following bolus administration of intravenous contrast. CONTRAST:  157mL ISOVUE-300 IOPAMIDOL (ISOVUE-300) INJECTION 61% COMPARISON:  12/05/2015 FINDINGS: Lower chest: No acute abnormality. Hepatobiliary: Tiny low-density structure in left lobe of liver measuring 4 mm is too small to reliably characterize. There are multiple small stones within the gallbladder. No gallbladder wall thickening or pericholecystic fluid. No biliary ductal dilatation. Pancreas: Unremarkable. No pancreatic ductal  dilatation or surrounding inflammatory changes. Spleen: Normal in size without focal abnormality. Adrenals/Urinary Tract: Normal adrenal glands. The kidneys are unremarkable. No mass or hydronephrosis. The urinary bladder appears normal. Stomach/Bowel: Stomach appears normal. No small bowel dilatation identified. Status post low anterior resection. Interval reversal of previous ileostomy. Vascular/Lymphatic: Aortic atherosclerosis without aneurysm. Porta hepatic lymph node measures 1.3 cm, image 25/2. Previously 1.2 cm. Small left retroperitoneal lymph nodes appear unchanged. Extensive surgical clips are identified within bilateral iliac nodal change. No adenopathy identified. Patent fem-fem bypass. Reproductive: Choose 2. Other: Bilobed fluid collection within the presacral soft tissues and right posterior  pelvic sidewall is again noted. Along the right posterior pelvic sidewall fluid collection measures 5.1 x 2.9 cm, image 70/2. Previously 4.4 x 1.5 cm. The component within the presacral region measures 4.2 by 2.0 cm, image 63/2. Previously 3.3 x 1.6 cm. Here, there is accumulation of high attenuation contrast material, image 59/7. There are several loops of small bowel which appear closely associated with this area and may have fistulous communication with the presacral fluid collection, image 64/2. Musculoskeletal: No aggressive lytic or sclerotic bone lesions. IMPRESSION: 1. In no abnormal dilatation of the small or large bowel loops to suggest bowel obstruction. 2. Bilobed fluid collection within the presacral soft tissues and right posterior pelvic sidewall is again noted and appears increased in size from previous exam. High attenuation material within the presacral component is noted which may reflect underlying fistulous communication with adjacent bowel loops. 3.  Aortic Atherosclerosis (ICD10-I70.0). Electronically Signed   By: Kerby Moors M.D.   On: 06/07/2018 11:23   Ct Image Guided Drainage By Percutaneous Catheter  Result Date: 06/08/2018 INDICATION: Remote history of cervical cancer complicated by development distal colonic stricture, post low anterior resection with diverting ileostomy, complicated by the anastomotic leak requiring percutaneous drainage catheter placement on 09/05/2015. Unfortunately, patient re-presented to the hospital with worsening abdominal/pelvic pain with repeat CT scan of the abdomen pelvis performed 06/07/2018 demonstrating interval enlargement of indeterminate fluid collection within the right hemipelvis. Request made for CT-guided aspiration and/or drainage catheter placement for infection source control purposes. EXAM: CT IMAGE GUIDED DRAINAGE BY PERCUTANEOUS CATHETER COMPARISON:  CT abdomen and pelvis-06/07/2018; 12/04/2015; 09/03/2015; CT-guided right trans  gluteal approach percutaneous drainage catheter placement-09/05/2015 MEDICATIONS: The patient is currently admitted to the hospital and receiving intravenous antibiotics. The antibiotics were administered within an appropriate time frame prior to the initiation of the procedure. ANESTHESIA/SEDATION: Moderate (conscious) sedation was employed during this procedure. A total of Versed 1.5 mg and Fentanyl 125 mcg was administered intravenously. Moderate Sedation Time: 20 minutes. The patient's level of consciousness and vital signs were monitored continuously by radiology nursing throughout the procedure under my direct supervision. CONTRAST:  None COMPLICATIONS: None immediate. PROCEDURE: Informed written consent was obtained from the patient after a discussion of the risks, benefits and alternatives to treatment. The patient was placed left lateral decubitus on the CT gantry and a pre procedural CT was performed re-demonstrating the known abscess/fluid collection within the right hemipelvis. Note, the patient was positioned left lateral decubitus as opposed to prone given the presence of her fem-fem bypass graft. The procedure was planned. A timeout was performed prior to the initiation of the procedure. The skin overlying the right buttocks was prepped and draped in the usual sterile fashion. The overlying soft tissues were anesthetized with 1% lidocaine with epinephrine. Appropriate trajectory was planned with the use of a 22 gauge spinal needle. An 18 gauge trocar needle was advanced into  the abscess/fluid collection and a short Amplatz super stiff wire was coiled within the collection. Appropriate positioning was confirmed with a limited CT scan. The tract was serially dilated allowing placement of a 10 [place] all-purpose drainage catheter. Appropriate positioning was confirmed with a limited postprocedural CT scan. Approximately 30 cc of serous, slightly blood tinged fluid was aspirated. The tube was connected to  a JP bulb and sutured in place. A dressing was placed. The patient tolerated the procedure well without immediate post procedural complication. IMPRESSION: Successful CT guided placement of a 10 French all purpose drain catheter into the right hemipelvis with aspiration of 30 cc of serous, slightly blood tinged fluid. Samples were sent to the laboratory as requested by the ordering clinical team. Samples were sent for both Gram stain and cytologic analysis. Electronically Signed   By: Sandi Mariscal M.D.   On: 06/08/2018 12:50    Orson Eva, DO  [facility] Pager [phone]  If 7PM-7AM, please contact night-coverage [email or web address] Password [email or web address] 06/10/2018, 6:02 PM   LOS: 3 days

## 2018-06-11 ENCOUNTER — Encounter (HOSPITAL_COMMUNITY): Payer: Self-pay

## 2018-06-11 ENCOUNTER — Inpatient Hospital Stay (HOSPITAL_COMMUNITY): Payer: Self-pay

## 2018-06-11 DIAGNOSIS — R1084 Generalized abdominal pain: Secondary | ICD-10-CM

## 2018-06-11 DIAGNOSIS — D519 Vitamin B12 deficiency anemia, unspecified: Secondary | ICD-10-CM

## 2018-06-11 DIAGNOSIS — R188 Other ascites: Secondary | ICD-10-CM

## 2018-06-11 MED ORDER — IOPAMIDOL (ISOVUE-300) INJECTION 61%
100.0000 mL | Freq: Once | INTRAVENOUS | Status: AC | PRN
  Administered 2018-06-11: 100 mL via INTRAVENOUS

## 2018-06-11 MED ORDER — OXYCODONE HCL 5 MG PO TABS: 5.0000 mg | ORAL_TABLET | ORAL | 0 refills | Status: DC | PRN

## 2018-06-11 MED ORDER — CIPROFLOXACIN HCL 500 MG PO TABS: 500.0000 mg | ORAL_TABLET | Freq: Two times a day (BID) | ORAL | 0 refills | Status: DC

## 2018-06-11 MED ORDER — METRONIDAZOLE 500 MG PO TABS: 500.0000 mg | ORAL_TABLET | Freq: Three times a day (TID) | ORAL | Status: DC

## 2018-06-11 MED ORDER — CIPROFLOXACIN HCL 250 MG PO TABS: 500.0000 mg | ORAL_TABLET | Freq: Two times a day (BID) | ORAL | Status: DC

## 2018-06-11 MED ORDER — METRONIDAZOLE 500 MG PO TABS: 500.0000 mg | ORAL_TABLET | Freq: Three times a day (TID) | ORAL | 0 refills | Status: DC

## 2018-06-11 MED ORDER — IOPAMIDOL (ISOVUE-300) INJECTION 61%
50.0000 mL | Freq: Once | INTRAVENOUS | Status: AC | PRN
  Administered 2018-06-11: 50 mL via ORAL

## 2018-06-11 NOTE — Progress Notes (Signed)
Patient planned for possible discharge - I have placed an order for IR scheduler to contact patient for outpatient follow up CT/drain injection in approximately 1-2 weeks. IR scheduler will contact patient early next week with appointment date and time.  Instructions for care have been included on the discharge summary as well as IR contact information. Patient will need to flush drain once daily with 5 mL NS and record output from drain once daily.  Please call IR with any questions or concerns.   Candiss Norse, PA-C Pager# [phone]

## 2018-06-11 NOTE — Discharge Summary (Signed)
Physician Discharge Summary  Cathy Jones WIO:[id number] DOB: November 15, 1960 DOA: 06/07/2018  PCP: Aletha Halim., PA-C  Admit date: 06/07/2018 Discharge date: 06/11/2018  Admitted From: Home Disposition:  Home   Recommendations for Outpatient Follow-up:  1. Follow up with PCP in 1-2 weeks 2. 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  CT of the abdomen and pelvis showed a bilobed fluid collection in the presacral soft tissues and posterior pelvic sidewall.  General surgery was consulted and recommended IR drainage.  Interventional radiology was consulted, and a drain was placed on 06/08/2018.  Cultures grew E. coli.  The patient's antibiotics were de-escalated.  The patient will be sent home with ciprofloxacin and metronidazole with a 3-week course.  Interventional radiology was contacted prior to discharge and stated that they would contact the patient after discharge to follow-up in 1 to 2 weeks.  General surgery ordered repeat CT of abdomen and pelvis which showed a decrease of the size of the abscess.  They cleared the patient for discharge.  Discharge Diagnoses:  Pelvic abscess -06/07/2018 CT abdomen/pelvis--bilobed fluid collection in the presacral soft tissues and posterior pelvic sidewall -Appreciate general surgery follow-up--does not feel the patient has a fistulous  tract -06/08/2018--IR drainage--EColi -CT guided placed of a 10 Fr drainage catheter placement into thelower pelvis via right transgluteal approachyielding 25 cc of slightly bloody serous fluid -change zosyn to ceftriaxone and flagyl -06/11/2018 CT abdomen and pelvis--interval decrease in the right posterior pelvic wall fluid collection measuring 4.0 x 2.4 cm which is decreased from 5.1 x 2.9 cm. -06/11/2018--Case discussed with general surgery, Dr. 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   metroNIDAZOLE 500 MG tablet Commonly known as:  FLAGYL Take 1 tablet (500 mg total) by mouth 3 (three) times daily.   oxyCODONE 5 MG immediate release tablet Commonly known as:  Oxy IR/ROXICODONE Take 1 tablet (5 mg total) by mouth every 4 (four) hours as needed for moderate pain.   PROBIOTIC PO Take 1 tablet by mouth daily.      Follow-up Information    [facility] Follow up.   Why:  IR scheduler will call you with appointment date/time for follow up in 1-2 weeks. Please call with any questions or concerns. Contact information: [place] [address] [phone]  Allergies  Allergen Reactions  . Codeine Other (See Comments)    "hyper"    Consultations:  General  surgery  IR   Procedures/Studies: Ct Abdomen Pelvis W Contrast  Result Date: 06/11/2018 CLINICAL DATA:  Follow-up pelvic fluid collection status post percutaneous drainage. EXAM: CT ABDOMEN AND PELVIS WITH CONTRAST TECHNIQUE: Multidetector CT imaging of the abdomen and pelvis was performed using the standard protocol following bolus administration of intravenous contrast. CONTRAST:  120mL ISOVUE-300 IOPAMIDOL (ISOVUE-300) INJECTION 61%, 92mL ISOVUE-300 IOPAMIDOL (ISOVUE-300) INJECTION 61% COMPARISON:  CT abdomen pelvis dated June 07, 2018. FINDINGS: Lower chest: No acute abnormality. Hepatobiliary: No focal liver abnormality is seen. Unchanged cholelithiasis. No gallbladder wall thickening or biliary dilatation. Pancreas: Unremarkable. No pancreatic ductal dilatation or surrounding inflammatory changes. Spleen: Normal in size without focal abnormality. Adrenals/Urinary Tract: Adrenal glands are unremarkable. Kidneys are normal, without renal calculi, focal lesion, or hydronephrosis. Bladder is unremarkable. Stomach/Bowel: Stomach is within normal limits. Prior low anterior resection and ileostomy reversal. No evidence of bowel wall thickening, distention, or inflammatory changes. Vascular/Lymphatic: Unchanged chronically occluded distal aorta, bilateral common, external, and iliac arteries, and right common femoral artery. There is reconstitution of the left common femoral artery from the inferior epigastric artery. Patent fem-fem bypass graft. Unchanged enlarged porta hepatis lymph node measuring 1.3 cm in short axis. Prior bilateral retroperitoneal lymph node dissection again noted. Reproductive: Status post hysterectomy. No adnexal masses. Other: Presacral and right posterior pelvic sidewall bilobed fluid collection again noted. Interval displacement of a drain within the right pelvic component, which has decreased in size, now measuring 4.0 x 2.4 cm, previously 5.1 x 2.9 cm. Largely unchanged  presacral component measuring 4.2 x 2.1 cm. Curvilinear high attenuating material within the presacral component is unchanged. Several small bowel loops in the pelvis remain inseparable from the collection. No ascites or pneumoperitoneum. Musculoskeletal: No acute or significant osseous findings. IMPRESSION: 1. Interval decrease in size of the right posterior pelvic sidewall component of the bilobed pelvic fluid collection status post percutaneous drainage. The collection now measures 4.0 x 2.4 cm, previously 5.1 x 2.9 cm. 2. Largely unchanged presacral component of the collection containing curvilinear high attenuating material. Several small bowel loops remain inseparable from the fluid collection, and although there is no obvious oral contrast within the fluid collection to suggest a large fistula, a small fistulous communication is still not entirely excluded. 3. Unchanged chronically occluded distal aorta, bilateral iliac arteries, and right common femoral artery. Reconstitution of the left common femoral artery via the inferior epigastric artery. Patent fem-fem bypass graft. 4. Cholelithiasis. Electronically Signed   By: Titus Dubin M.D.   On: 06/11/2018 14:09   Ct Abdomen Pelvis W Contrast  Result Date: 06/07/2018 CLINICAL DATA:  Vomiting and constipation. EXAM: CT ABDOMEN AND PELVIS WITH CONTRAST TECHNIQUE: Multidetector CT imaging of the abdomen and pelvis was performed using the standard protocol following bolus administration of intravenous contrast. CONTRAST:  139mL ISOVUE-300 IOPAMIDOL (ISOVUE-300) INJECTION 61% COMPARISON:  12/05/2015 FINDINGS: Lower chest: No acute abnormality. Hepatobiliary: Tiny low-density structure in left lobe of liver measuring 4 mm is too small to reliably characterize. There are multiple small stones within the gallbladder. No gallbladder wall thickening or pericholecystic fluid. No biliary ductal dilatation. Pancreas: Unremarkable. No pancreatic ductal dilatation or  surrounding inflammatory changes. Spleen: Normal in size without focal abnormality. Adrenals/Urinary Tract: Normal adrenal glands. The kidneys are unremarkable. No mass or hydronephrosis. The urinary bladder appears normal. Stomach/Bowel: Stomach appears normal. No small bowel dilatation identified. Status post low  anterior resection. Interval reversal of previous ileostomy. Vascular/Lymphatic: Aortic atherosclerosis without aneurysm. Porta hepatic lymph node measures 1.3 cm, image 25/2. Previously 1.2 cm. Small left retroperitoneal lymph nodes appear unchanged. Extensive surgical clips are identified within bilateral iliac nodal change. No adenopathy identified. Patent fem-fem bypass. Reproductive: Choose 2. Other: Bilobed fluid collection within the presacral soft tissues and right posterior pelvic sidewall is again noted. Along the right posterior pelvic sidewall fluid collection measures 5.1 x 2.9 cm, image 70/2. Previously 4.4 x 1.5 cm. The component within the presacral region measures 4.2 by 2.0 cm, image 63/2. Previously 3.3 x 1.6 cm. Here, there is accumulation of high attenuation contrast material, image 59/7. There are several loops of small bowel which appear closely associated with this area and may have fistulous communication with the presacral fluid collection, image 64/2. Musculoskeletal: No aggressive lytic or sclerotic bone lesions. IMPRESSION: 1. In no abnormal dilatation of the small or large bowel loops to suggest bowel obstruction. 2. Bilobed fluid collection within the presacral soft tissues and right posterior pelvic sidewall is again noted and appears increased in size from previous exam. High attenuation material within the presacral component is noted which may reflect underlying fistulous communication with adjacent bowel loops. 3.  Aortic Atherosclerosis (ICD10-I70.0). Electronically Signed   By: Kerby Moors M.D.   On: 06/07/2018 11:23   Ct Image Guided Drainage By Percutaneous  Catheter  Result Date: 06/08/2018 INDICATION: Remote history of cervical cancer complicated by development distal colonic stricture, post low anterior resection with diverting ileostomy, complicated by the anastomotic leak requiring percutaneous drainage catheter placement on 09/05/2015. Unfortunately, patient re-presented to the hospital with worsening abdominal/pelvic pain with repeat CT scan of the abdomen pelvis performed 06/07/2018 demonstrating interval enlargement of indeterminate fluid collection within the right hemipelvis. Request made for CT-guided aspiration and/or drainage catheter placement for infection source control purposes. EXAM: CT IMAGE GUIDED DRAINAGE BY PERCUTANEOUS CATHETER COMPARISON:  CT abdomen and pelvis-06/07/2018; 12/04/2015; 09/03/2015; CT-guided right trans gluteal approach percutaneous drainage catheter placement-09/05/2015 MEDICATIONS: The patient is currently admitted to the hospital and receiving intravenous antibiotics. The antibiotics were administered within an appropriate time frame prior to the initiation of the procedure. ANESTHESIA/SEDATION: Moderate (conscious) sedation was employed during this procedure. A total of Versed 1.5 mg and Fentanyl 125 mcg was administered intravenously. Moderate Sedation Time: 20 minutes. The patient's level of consciousness and vital signs were monitored continuously by radiology nursing throughout the procedure under my direct supervision. CONTRAST:  None COMPLICATIONS: None immediate. PROCEDURE: Informed written consent was obtained from the patient after a discussion of the risks, benefits and alternatives to treatment. The patient was placed left lateral decubitus on the CT gantry and a pre procedural CT was performed re-demonstrating the known abscess/fluid collection within the right hemipelvis. Note, the patient was positioned left lateral decubitus as opposed to prone given the presence of her fem-fem bypass graft. The procedure was  planned. A timeout was performed prior to the initiation of the procedure. The skin overlying the right buttocks was prepped and draped in the usual sterile fashion. The overlying soft tissues were anesthetized with 1% lidocaine with epinephrine. Appropriate trajectory was planned with the use of a 22 gauge spinal needle. An 18 gauge trocar needle was advanced into the abscess/fluid collection and a short Amplatz super stiff wire was coiled within the collection. Appropriate positioning was confirmed with a limited CT scan. The tract was serially dilated allowing placement of a 10 [place] all-purpose drainage catheter. Appropriate positioning was confirmed  with a limited postprocedural CT scan. Approximately 30 cc of serous, slightly blood tinged fluid was aspirated. The tube was connected to a JP bulb and sutured in place. A dressing was placed. The patient tolerated the procedure well without immediate post procedural complication. IMPRESSION: Successful CT guided placement of a 10 French all purpose drain catheter into the right hemipelvis with aspiration of 30 cc of serous, slightly blood tinged fluid. Samples were sent to the laboratory as requested by the ordering clinical team. Samples were sent for both Gram stain and cytologic analysis. Electronically Signed   By: Sandi Mariscal M.D.   On: 06/08/2018 12:50         Discharge Exam: Vitals:   06/10/18 1949 06/10/18 2104  BP:  127/69  Pulse:  65  Resp:  18  Temp:  98.1 F (36.7 C)  SpO2: 96% 99%   Vitals:   06/09/18 2139 06/10/18 0801 06/10/18 1949 06/10/18 2104  BP: (!) 110/54 (!) 133/57  127/69  Pulse: (!) 58 68  65  Resp: 16 14  18   Temp: 98.4 F (36.9 C) 98.4 F (36.9 C)  98.1 F (36.7 C)  TempSrc: Oral Oral  Oral  SpO2: 96% 98% 96% 99%  Weight:      Height:        General: Pt is alert, awake, not in acute distress Cardiovascular: RRR, S1/S2 +, no rubs, no gallops Respiratory: CTA bilaterally, no wheezing, no  rhonchi Abdominal: Soft, NT, ND, bowel sounds + Extremities: no edema, no cyanosis   The results of significant diagnostics from this hospitalization (including imaging, microbiology, ancillary and laboratory) are listed below for reference.    Significant Diagnostic Studies: Ct Abdomen Pelvis W Contrast  Result Date: 06/11/2018 CLINICAL DATA:  Follow-up pelvic fluid collection status post percutaneous drainage. EXAM: CT ABDOMEN AND PELVIS WITH CONTRAST TECHNIQUE: Multidetector CT imaging of the abdomen and pelvis was performed using the standard protocol following bolus administration of intravenous contrast. CONTRAST:  149mL ISOVUE-300 IOPAMIDOL (ISOVUE-300) INJECTION 61%, 64mL ISOVUE-300 IOPAMIDOL (ISOVUE-300) INJECTION 61% COMPARISON:  CT abdomen pelvis dated June 07, 2018. FINDINGS: Lower chest: No acute abnormality. Hepatobiliary: No focal liver abnormality is seen. Unchanged cholelithiasis. No gallbladder wall thickening or biliary dilatation. Pancreas: Unremarkable. No pancreatic ductal dilatation or surrounding inflammatory changes. Spleen: Normal in size without focal abnormality. Adrenals/Urinary Tract: Adrenal glands are unremarkable. Kidneys are normal, without renal calculi, focal lesion, or hydronephrosis. Bladder is unremarkable. Stomach/Bowel: Stomach is within normal limits. Prior low anterior resection and ileostomy reversal. No evidence of bowel wall thickening, distention, or inflammatory changes. Vascular/Lymphatic: Unchanged chronically occluded distal aorta, bilateral common, external, and iliac arteries, and right common femoral artery. There is reconstitution of the left common femoral artery from the inferior epigastric artery. Patent fem-fem bypass graft. Unchanged enlarged porta hepatis lymph node measuring 1.3 cm in short axis. Prior bilateral retroperitoneal lymph node dissection again noted. Reproductive: Status post hysterectomy. No adnexal masses. Other: Presacral and  right posterior pelvic sidewall bilobed fluid collection again noted. Interval displacement of a drain within the right pelvic component, which has decreased in size, now measuring 4.0 x 2.4 cm, previously 5.1 x 2.9 cm. Largely unchanged presacral component measuring 4.2 x 2.1 cm. Curvilinear high attenuating material within the presacral component is unchanged. Several small bowel loops in the pelvis remain inseparable from the collection. No ascites or pneumoperitoneum. Musculoskeletal: No acute or significant osseous findings. IMPRESSION: 1. Interval decrease in size of the right posterior pelvic sidewall component of the bilobed  pelvic fluid collection status post percutaneous drainage. The collection now measures 4.0 x 2.4 cm, previously 5.1 x 2.9 cm. 2. Largely unchanged presacral component of the collection containing curvilinear high attenuating material. Several small bowel loops remain inseparable from the fluid collection, and although there is no obvious oral contrast within the fluid collection to suggest a large fistula, a small fistulous communication is still not entirely excluded. 3. Unchanged chronically occluded distal aorta, bilateral iliac arteries, and right common femoral artery. Reconstitution of the left common femoral artery via the inferior epigastric artery. Patent fem-fem bypass graft. 4. Cholelithiasis. Electronically Signed   By: Titus Dubin M.D.   On: 06/11/2018 14:09   Ct Abdomen Pelvis W Contrast  Result Date: 06/07/2018 CLINICAL DATA:  Vomiting and constipation. EXAM: CT ABDOMEN AND PELVIS WITH CONTRAST TECHNIQUE: Multidetector CT imaging of the abdomen and pelvis was performed using the standard protocol following bolus administration of intravenous contrast. CONTRAST:  146mL ISOVUE-300 IOPAMIDOL (ISOVUE-300) INJECTION 61% COMPARISON:  12/05/2015 FINDINGS: Lower chest: No acute abnormality. Hepatobiliary: Tiny low-density structure in left lobe of liver measuring 4 mm is  too small to reliably characterize. There are multiple small stones within the gallbladder. No gallbladder wall thickening or pericholecystic fluid. No biliary ductal dilatation. Pancreas: Unremarkable. No pancreatic ductal dilatation or surrounding inflammatory changes. Spleen: Normal in size without focal abnormality. Adrenals/Urinary Tract: Normal adrenal glands. The kidneys are unremarkable. No mass or hydronephrosis. The urinary bladder appears normal. Stomach/Bowel: Stomach appears normal. No small bowel dilatation identified. Status post low anterior resection. Interval reversal of previous ileostomy. Vascular/Lymphatic: Aortic atherosclerosis without aneurysm. Porta hepatic lymph node measures 1.3 cm, image 25/2. Previously 1.2 cm. Small left retroperitoneal lymph nodes appear unchanged. Extensive surgical clips are identified within bilateral iliac nodal change. No adenopathy identified. Patent fem-fem bypass. Reproductive: Choose 2. Other: Bilobed fluid collection within the presacral soft tissues and right posterior pelvic sidewall is again noted. Along the right posterior pelvic sidewall fluid collection measures 5.1 x 2.9 cm, image 70/2. Previously 4.4 x 1.5 cm. The component within the presacral region measures 4.2 by 2.0 cm, image 63/2. Previously 3.3 x 1.6 cm. Here, there is accumulation of high attenuation contrast material, image 59/7. There are several loops of small bowel which appear closely associated with this area and may have fistulous communication with the presacral fluid collection, image 64/2. Musculoskeletal: No aggressive lytic or sclerotic bone lesions. IMPRESSION: 1. In no abnormal dilatation of the small or large bowel loops to suggest bowel obstruction. 2. Bilobed fluid collection within the presacral soft tissues and right posterior pelvic sidewall is again noted and appears increased in size from previous exam. High attenuation material within the presacral component is noted  which may reflect underlying fistulous communication with adjacent bowel loops. 3.  Aortic Atherosclerosis (ICD10-I70.0). Electronically Signed   By: Kerby Moors M.D.   On: 06/07/2018 11:23   Ct Image Guided Drainage By Percutaneous Catheter  Result Date: 06/08/2018 INDICATION: Remote history of cervical cancer complicated by development distal colonic stricture, post low anterior resection with diverting ileostomy, complicated by the anastomotic leak requiring percutaneous drainage catheter placement on 09/05/2015. Unfortunately, patient re-presented to the hospital with worsening abdominal/pelvic pain with repeat CT scan of the abdomen pelvis performed 06/07/2018 demonstrating interval enlargement of indeterminate fluid collection within the right hemipelvis. Request made for CT-guided aspiration and/or drainage catheter placement for infection source control purposes. EXAM: CT IMAGE GUIDED DRAINAGE BY PERCUTANEOUS CATHETER COMPARISON:  CT abdomen and pelvis-06/07/2018; 12/04/2015; 09/03/2015; CT-guided right  trans gluteal approach percutaneous drainage catheter placement-09/05/2015 MEDICATIONS: The patient is currently admitted to the hospital and receiving intravenous antibiotics. The antibiotics were administered within an appropriate time frame prior to the initiation of the procedure. ANESTHESIA/SEDATION: Moderate (conscious) sedation was employed during this procedure. A total of Versed 1.5 mg and Fentanyl 125 mcg was administered intravenously. Moderate Sedation Time: 20 minutes. The patient's level of consciousness and vital signs were monitored continuously by radiology nursing throughout the procedure under my direct supervision. CONTRAST:  None COMPLICATIONS: None immediate. PROCEDURE: Informed written consent was obtained from the patient after a discussion of the risks, benefits and alternatives to treatment. The patient was placed left lateral decubitus on the CT gantry and a pre procedural CT  was performed re-demonstrating the known abscess/fluid collection within the right hemipelvis. Note, the patient was positioned left lateral decubitus as opposed to prone given the presence of her fem-fem bypass graft. The procedure was planned. A timeout was performed prior to the initiation of the procedure. The skin overlying the right buttocks was prepped and draped in the usual sterile fashion. The overlying soft tissues were anesthetized with 1% lidocaine with epinephrine. Appropriate trajectory was planned with the use of a 22 gauge spinal needle. An 18 gauge trocar needle was advanced into the abscess/fluid collection and a short Amplatz super stiff wire was coiled within the collection. Appropriate positioning was confirmed with a limited CT scan. The tract was serially dilated allowing placement of a 10 [place] all-purpose drainage catheter. Appropriate positioning was confirmed with a limited postprocedural CT scan. Approximately 30 cc of serous, slightly blood tinged fluid was aspirated. The tube was connected to a JP bulb and sutured in place. A dressing was placed. The patient tolerated the procedure well without immediate post procedural complication. IMPRESSION: Successful CT guided placement of a 10 French all purpose drain catheter into the right hemipelvis with aspiration of 30 cc of serous, slightly blood tinged fluid. Samples were sent to the laboratory as requested by the ordering clinical team. Samples were sent for both Gram stain and cytologic analysis. Electronically Signed   By: Sandi Mariscal M.D.   On: 06/08/2018 12:50     Microbiology: Recent Results (from the past 240 hour(s))  Aerobic/Anaerobic Culture (surgical/deep wound)     Status: None (Preliminary result)   Collection Time: 06/08/18 11:50 AM  Result Value Ref Range Status   Specimen Description ABSCESS ABDOMEN  Final   Special Requests NONE  Final   Gram Stain   Final    ABUNDANT WBC PRESENT,BOTH PMN AND MONONUCLEAR NO  ORGANISMS SEEN Performed at [facility], [address] [place], [place] [address]    Culture   Final    RARE ESCHERICHIA COLI NO ANAEROBES ISOLATED; CULTURE IN PROGRESS FOR 5 DAYS    Report Status PENDING  Incomplete   Organism ID, Bacteria ESCHERICHIA COLI  Final      Susceptibility   Escherichia coli - MIC*    AMPICILLIN >=32 RESISTANT Resistant     CEFAZOLIN <=4 SENSITIVE Sensitive     CEFEPIME <=1 SENSITIVE Sensitive     CEFTAZIDIME <=1 SENSITIVE Sensitive     CEFTRIAXONE <=1 SENSITIVE Sensitive     CIPROFLOXACIN <=0.25 SENSITIVE Sensitive     GENTAMICIN <=1 SENSITIVE Sensitive     IMIPENEM <=0.25 SENSITIVE Sensitive     TRIMETH/SULFA <=20 SENSITIVE Sensitive     AMPICILLIN/SULBACTAM 16 INTERMEDIATE Intermediate     PIP/TAZO <=4 SENSITIVE Sensitive     Extended  ESBL NEGATIVE Sensitive     * RARE ESCHERICHIA COLI     Labs: Basic Metabolic Panel: Recent Labs  Lab 06/07/18 0902 06/08/18 0425 06/10/18 0439  NA 138 140 139  K 4.2 4.1 3.8  CL 105 108 111  CO2 22 25 22   GLUCOSE 119* 114* 101*  BUN 14 8 8   CREATININE 0.77 0.84 0.73  CALCIUM 9.1 8.8* 8.6*   Liver Function Tests: Recent Labs  Lab 06/07/18 0902  AST 13*  ALT 9  ALKPHOS 96  BILITOT 0.5  PROT 6.9  ALBUMIN 3.8   Recent Labs  Lab 06/07/18 0902  LIPASE 28   No results for input(s): AMMONIA in the last 168 hours. CBC: Recent Labs  Lab 06/07/18 0902 06/08/18 0425 06/10/18 0439  WBC 14.5* 10.1 7.3  HGB 14.1 12.8 12.6  HCT 46.3* 42.8 41.8  MCV 87.9 89.2 88.9  PLT 194 182 159   Cardiac Enzymes: No results for input(s): CKTOTAL, CKMB, CKMBINDEX, TROPONINI in the last 168 hours. BNP: Invalid input(s): POCBNP CBG: No results for input(s): GLUCAP in the last 168 hours.  Time coordinating discharge:  36 minutes  Signed:  Orson Eva, DO [facility] Pager: [phone] 06/11/2018, 4:17 PM

## 2018-06-11 NOTE — Progress Notes (Signed)
  Subjective: Patient has no complaints.  Objective: Vital signs in last 24 hours: Temp:  [98.1 F (36.7 C)-98.5 F (36.9 C)] 98.5 F (36.9 C) (01/31 0515) Pulse Rate:  [57-70] 57 (01/31 0515) Resp:  [16-18] 16 (01/31 0515) BP: (120-144)/(59-95) 120/59 (01/31 0515) SpO2:  [96 %-99 %] 98 % (01/31 0515) Last BM Date: 06/09/18  Intake/Output from previous day: 01/30 0701 - 01/31 0700 In: 1882.9 [P.O.:960; I.V.:722.9; IV Piggyback:200] Out: -  Intake/Output this shift: No intake/output data recorded.  General appearance: alert, cooperative and no distress GI: soft, non-tender; bowel sounds normal; no masses,  no organomegaly and JP drainage minimal and serous  Lab Results:  Recent Labs    06/10/18 0439  WBC 7.3  HGB 12.6  HCT 41.8  PLT 159   BMET Recent Labs    06/10/18 0439  NA 139  K 3.8  CL 111  CO2 22  GLUCOSE 101*  BUN 8  CREATININE 0.73  CALCIUM 8.6*   PT/INR No results for input(s): LABPROT, INR in the last 72 hours.  Studies/Results: No results found.  Anti-infectives: Anti-infectives (From admission, onward)   Start     Dose/Rate Route Frequency Ordered Stop   06/10/18 2000  cefTRIAXone (ROCEPHIN) 2 g in sodium chloride 0.9 % 100 mL IVPB     2 g 200 mL/hr over 30 Minutes Intravenous Every 24 hours 06/10/18 1628     06/10/18 1800  metroNIDAZOLE (FLAGYL) IVPB 500 mg     500 mg 100 mL/hr over 60 Minutes Intravenous Every 8 hours 06/10/18 1628     06/07/18 2000  piperacillin-tazobactam (ZOSYN) IVPB 3.375 g  Status:  Discontinued     3.375 g 12.5 mL/hr over 240 Minutes Intravenous Every 8 hours 06/07/18 1405 06/10/18 1628   06/07/18 1245  piperacillin-tazobactam (ZOSYN) IVPB 3.375 g     3.375 g 100 mL/hr over 30 Minutes Intravenous  Once 06/07/18 1233 06/07/18 1351      Assessment/Plan: Impression: Status post percutaneous CT-guided drainage of pelvic abscess.  Patient progressing well.  Final ID and sensitivities pending. Plan: Will get  follow-up CT scan today.  Should this show improvement, patient may be discharged today with follow-up with interventional radiology.  Outpatient oral antibiotics pending culture results.  LOS: 4 days    Aviva Signs 06/11/2018

## 2018-06-13 LAB — AEROBIC/ANAEROBIC CULTURE W GRAM STAIN (SURGICAL/DEEP WOUND)

## 2018-06-13 LAB — AEROBIC/ANAEROBIC CULTURE (SURGICAL/DEEP WOUND)

## 2018-06-14 ENCOUNTER — Other Ambulatory Visit: Payer: Self-pay | Admitting: General Surgery

## 2018-06-14 DIAGNOSIS — R188 Other ascites: Secondary | ICD-10-CM

## 2018-06-14 DIAGNOSIS — N739 Female pelvic inflammatory disease, unspecified: Secondary | ICD-10-CM

## 2018-06-23 ENCOUNTER — Ambulatory Visit
Admission: RE | Admit: 2018-06-23 | Discharge: 2018-06-23 | Disposition: A | Discharge status: Home / Self Care | Payer: Self-pay | Source: Ambulatory Visit | Attending: General Surgery | Admitting: General Surgery

## 2018-06-23 ENCOUNTER — Encounter: Payer: Self-pay | Admitting: *Deleted

## 2018-06-23 ENCOUNTER — Other Ambulatory Visit: Payer: Self-pay | Admitting: General Surgery

## 2018-06-23 ENCOUNTER — Other Ambulatory Visit (HOSPITAL_COMMUNITY): Payer: Self-pay | Admitting: Physician Assistant

## 2018-06-23 ENCOUNTER — Ambulatory Visit
Admission: RE | Admit: 2018-06-23 | Discharge: 2018-06-23 | Disposition: A | Discharge status: Home / Self Care | Payer: Self-pay | Source: Ambulatory Visit | Attending: Physician Assistant | Admitting: Physician Assistant

## 2018-06-23 DIAGNOSIS — R188 Other ascites: Secondary | ICD-10-CM

## 2018-06-23 DIAGNOSIS — N739 Female pelvic inflammatory disease, unspecified: Secondary | ICD-10-CM

## 2018-06-23 HISTORY — PX: IR RADIOLOGIST EVAL & MGMT: IMG5224

## 2018-06-23 MED ORDER — IOPAMIDOL (ISOVUE-300) INJECTION 61%
100.0000 mL | Freq: Once | INTRAVENOUS | Status: AC | PRN
  Administered 2018-06-23: 100 mL via INTRAVENOUS

## 2018-06-23 MED ORDER — FLUCONAZOLE 150 MG PO TABS: 150.0000 mg | ORAL_TABLET | Freq: Every day | ORAL | 0 refills | Status: AC

## 2018-06-23 NOTE — Progress Notes (Signed)
Chief Complaint: F/U Drain for pelvic abscess  Referring Physician(s): Emokpae,Courage  Supervising Physician: Daryll Brod  History of Present Illness: Cathy Jones is a 58 y.o. female with history of low anterior resection with diverting ileostomy, subsequent ileostomy takedown in 2017.  She presented to [facility] ED with a recurrent pelvic fluid collection.   She did have a drain placed back in April of 2017 after the ileostomy takedown to resolve a previous fluid collection which remained for 6 months.  Over the last month, she started having some episodes of lower abdominal discomfort with nausea.   CT scan of the abdomen done on 06/07/18 in the ED showed recurrence of the pelvic fluid collection.  She underwent drain placement by Dr. Pascal Lux on 06/08/18.  She is here today for follow up.  She is flushing the drain only once a day. She states there has been only 2-3 mL output over the last few days.  She has history of a  Fem-Fem bypass but is not on blood thinners.  She only c/o a yeast infection secondary to the antibiotics she is still taking. She denies fever/chills.  Past Medical History:  Diagnosis Date  . Acute renal failure (ARF) ([place]) 08/07/2015  . Anemia   . Asthma    pt brought her proair inhaler 06-06-14  . Cervical cancer ([place])   . Colon polyp    hyperplastic  . 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 Colonic obstruction ([place])   . Depression   . Family history of adverse reaction to anesthesia    pts mother experiences nausea and vomiting   . GERD (gastroesophageal reflux disease)   . History of bronchitis   . History of chemotherapy   . History of hiatal hernia   . History of radiation therapy   . Hyperlipidemia   . Hypertension   . 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 Migraine    "q couple years maybe" (08/07/2015)  . Shortness of breath dyspnea    seasonal     Past Surgical History:  Procedure Laterality Date  . ABDOMINAL  HYSTERECTOMY  1994  . COLON RESECTION N/A 06/27/2015   Procedure: LYSIS OF ADHESIONS LOW ANTERIOR RESECTION DIVERTING LOOP ILEOSTOMY;  Surgeon: Leighton Ruff, MD;  Location: [facility];  Service: General;  Laterality: N/A;  . COLON SURGERY    . COLONOSCOPY    . CYSTOSCOPY WITH STENT PLACEMENT Bilateral 06/27/2015   Procedure: CYSTOSCOPY WITH CATHETER  PLACEMENT;  Surgeon: Franchot Gallo, MD;  Location: [facility];  Service: Urology;  Laterality: Bilateral;  . DIVERTING ILEOSTOMY N/A 06/27/2015   Procedure: DIVERTING LOOP ILEOSTOMY;  Surgeon: Leighton Ruff, MD;  Location: [facility];  Service: General;  Laterality: N/A;  . FEMORAL-FEMORAL BYPASS GRAFT Bilateral 08/10/2015   Procedure: BYPASS GRAFT LEFT FEMORALTO RIGHT FEMORAL ARTERY;  Surgeon: Rosetta Posner, MD;  Location: [place];  Service: Vascular;  Laterality: Bilateral;  . FLEXIBLE SIGMOIDOSCOPY  12/12/2015   Procedure: FLEXIBLE SIGMOIDOSCOPY;  Surgeon: Leighton Ruff, MD;  Location: [facility];  Service: General;;  . ILEOSTOMY CLOSURE N/A 12/12/2015   Procedure: OPEN ILEOSTOMY REVERSAL AND PELVIC WASHOUT;  Surgeon: Leighton Ruff, MD;  Location: [facility];  Service: General;  Laterality: N/A;  . INTRAOPERATIVE ARTERIOGRAM Right 08/10/2015   Procedure: INTRA OPERATIVE ARTERIOGRAM Right Iliac Artery;  Surgeon: Rosetta Posner, MD;  Location[facility];  Service: Vascular;  Laterality: Right;  . IR GENERIC HISTORICAL  09/12/2015   IR RADIOLOGIST EVAL & MGMT 09/12/2015  Monia Sabal, PA-C [facility] INTERV RAD  . IV antibiotic infusion therapy     . PERIPHERAL VASCULAR CATHETERIZATION N/A 08/09/2015   Procedure: Abdominal Aortogram w/Lower Extremity;  Surgeon: Conrad N[place], MD;  Location: [place] [facility];  Service: Cardiovascular;  Laterality: N/A;  . PERIPHERAL VASCULAR CATHETERIZATION Left 08/09/2015   Procedure: Peripheral Vascular Intervention;  Surgeon: Conrad [place], MD;  Location: [place] [facility];  Service: Cardiovascular;  Laterality: Left;  common iliac  . PICC LINE PLACE  PERIPHERAL ([place] HX)    . THROMBECTOMY FEMORAL ARTERY Bilateral 09/05/2015   Procedure: THROMBECTOMY Left to Right Femoral to  FEMORAL ARTERY Bypass,.;  Surgeon: Mal Misty, MD;  Location: [place];  Service: Vascular;  Laterality: Bilateral;  . THROMBECTOMY ILIAC ARTERY Right 08/10/2015   Procedure: THROMBECTOMY Right ILIAC ARTERY;  Surgeon: Rosetta Posner, MD;  Location: [place];  Service: Vascular;  Laterality: Right;  . TUBAL LIGATION    . UPPER GASTROINTESTINAL ENDOSCOPY      Allergies: Codeine  Medications: Prior to Admission medications   Medication Sig Start Date End Date Taking? Authorizing Provider  ciprofloxacin (CIPRO) 500 MG tablet Take 1 tablet (500 mg total) by mouth 2 (two) times daily. 06/11/18   Orson Eva, MD  metroNIDAZOLE (FLAGYL) 500 MG tablet Take 1 tablet (500 mg total) by mouth 3 (three) times daily. 06/11/18   Orson Eva, MD  oxyCODONE (OXY IR/ROXICODONE) 5 MG immediate release tablet Take 1 tablet (5 mg total) by mouth every 4 (four) hours as needed for moderate pain. 06/11/18   Orson Eva, MD  Probiotic Product (PROBIOTIC PO) Take 1 tablet by mouth daily.    [provider]     Family History  Problem Relation Age of Onset  . Rectal cancer Cousin   . Hypertension Mother   . Hyperparathyroidism Mother   . Hypertension Father   . CAD Father        CABG  . Peripheral vascular disease Father   . Graves' disease Father   . Colon cancer Neg Hx   . Stomach cancer Neg Hx   . Esophageal cancer Neg Hx   . Deep vein thrombosis Neg Hx     Social History   Socioeconomic History  . Marital status: Married    Spouse name: Not on file  . Number of children: 1  . Years of education: Not on file  . Highest education level: Not on file  Occupational History  . Not on file  Social Needs  . Financial resource strain: Patient refused  . Food insecurity:    Worry: Patient refused    Inability: Patient refused  . Transportation needs:    Medical: Patient  refused    Non-medical: Patient refused  Tobacco Use  . Smoking status: Current Every Day Smoker    Packs/day: 1.50    Years: 30.00    Pack years: 45.00    Types: Cigarettes  . Smokeless tobacco: Never Used  . Tobacco comment: Less than 10 cigarettes per day  Substance and Sexual Activity  . Alcohol use: No    Alcohol/week: 0.0 standard drinks  . Drug use: No  . Sexual activity: Yes  Lifestyle  . Physical activity:    Days per week: Patient refused    Minutes per session: Patient refused  . Stress: Patient refused  Relationships  . Social connections:    Talks on phone: Patient refused    Gets together: Patient refused    Attends religious  service: Patient refused    Active member of club or organization: Patient refused    Attends meetings of clubs or organizations: Patient refused    Relationship status: Patient refused  Other Topics Concern  . Not on file  Social History Narrative  . Not on file   Review of Systems  Physical Exam Vitals signs reviewed.  Constitutional:      Appearance: Normal appearance.  HENT:     Head: Normocephalic and atraumatic.  Eyes:     Extraocular Movements: Extraocular movements intact.  Neck:     Musculoskeletal: Normal range of motion.  Pulmonary:     Effort: Pulmonary effort is normal. No respiratory distress.  Abdominal:     Palpations: Abdomen is soft.     Tenderness: There is no abdominal tenderness.  Neurological:     General: No focal deficit present.     Mental Status: She is alert and oriented to person, place, and time.  Psychiatric:        Mood and Affect: Mood normal.        Behavior: Behavior normal.        Thought Content: Thought content normal.        Judgment: Judgment normal.   The drain flushes easily. There is no drainage at all in the bulb.  Imaging: Ct Abdomen Pelvis W Contrast  Result Date: 06/11/2018 CLINICAL DATA:  Follow-up pelvic fluid collection status post percutaneous drainage. EXAM: CT  ABDOMEN AND PELVIS WITH CONTRAST TECHNIQUE: Multidetector CT imaging of the abdomen and pelvis was performed using the standard protocol following bolus administration of intravenous contrast. CONTRAST:  124mL ISOVUE-300 IOPAMIDOL (ISOVUE-300) INJECTION 61%, 33mL ISOVUE-300 IOPAMIDOL (ISOVUE-300) INJECTION 61% COMPARISON:  CT abdomen pelvis dated June 07, 2018. FINDINGS: Lower chest: No acute abnormality. Hepatobiliary: No focal liver abnormality is seen. Unchanged cholelithiasis. No gallbladder wall thickening or biliary dilatation. Pancreas: Unremarkable. No pancreatic ductal dilatation or surrounding inflammatory changes. Spleen: Normal in size without focal abnormality. Adrenals/Urinary Tract: Adrenal glands are unremarkable. Kidneys are normal, without renal calculi, focal lesion, or hydronephrosis. Bladder is unremarkable. Stomach/Bowel: Stomach is within normal limits. Prior low anterior resection and ileostomy reversal. No evidence of bowel wall thickening, distention, or inflammatory changes. Vascular/Lymphatic: Unchanged chronically occluded distal aorta, bilateral common, external, and iliac arteries, and right common femoral artery. There is reconstitution of the left common femoral artery from the inferior epigastric artery. Patent fem-fem bypass graft. Unchanged enlarged porta hepatis lymph node measuring 1.3 cm in short axis. Prior bilateral retroperitoneal lymph node dissection again noted. Reproductive: Status post hysterectomy. No adnexal masses. Other: Presacral and right posterior pelvic sidewall bilobed fluid collection again noted. Interval displacement of a drain within the right pelvic component, which has decreased in size, now measuring 4.0 x 2.4 cm, previously 5.1 x 2.9 cm. Largely unchanged presacral component measuring 4.2 x 2.1 cm. Curvilinear high attenuating material within the presacral component is unchanged. Several small bowel loops in the pelvis remain inseparable from the  collection. No ascites or pneumoperitoneum. Musculoskeletal: No acute or significant osseous findings. IMPRESSION: 1. Interval decrease in size of the right posterior pelvic sidewall component of the bilobed pelvic fluid collection status post percutaneous drainage. The collection now measures 4.0 x 2.4 cm, previously 5.1 x 2.9 cm. 2. Largely unchanged presacral component of the collection containing curvilinear high attenuating material. Several small bowel loops remain inseparable from the fluid collection, and although there is no obvious oral contrast within the fluid collection to suggest a large fistula, a  small fistulous communication is still not entirely excluded. 3. Unchanged chronically occluded distal aorta, bilateral iliac arteries, and right common femoral artery. Reconstitution of the left common femoral artery via the inferior epigastric artery. Patent fem-fem bypass graft. 4. Cholelithiasis. Electronically Signed   By: Titus Dubin M.D.   On: 06/11/2018 14:09   Ct Abdomen Pelvis W Contrast  Result Date: 06/07/2018 CLINICAL DATA:  Vomiting and constipation. EXAM: CT ABDOMEN AND PELVIS WITH CONTRAST TECHNIQUE: Multidetector CT imaging of the abdomen and pelvis was performed using the standard protocol following bolus administration of intravenous contrast. CONTRAST:  134mL ISOVUE-300 IOPAMIDOL (ISOVUE-300) INJECTION 61% COMPARISON:  12/05/2015 FINDINGS: Lower chest: No acute abnormality. Hepatobiliary: Tiny low-density structure in left lobe of liver measuring 4 mm is too small to reliably characterize. There are multiple small stones within the gallbladder. No gallbladder wall thickening or pericholecystic fluid. No biliary ductal dilatation. Pancreas: Unremarkable. No pancreatic ductal dilatation or surrounding inflammatory changes. Spleen: Normal in size without focal abnormality. Adrenals/Urinary Tract: Normal adrenal glands. The kidneys are unremarkable. No mass or hydronephrosis. The  urinary bladder appears normal. Stomach/Bowel: Stomach appears normal. No small bowel dilatation identified. Status post low anterior resection. Interval reversal of previous ileostomy. Vascular/Lymphatic: Aortic atherosclerosis without aneurysm. Porta hepatic lymph node measures 1.3 cm, image 25/2. Previously 1.2 cm. Small left retroperitoneal lymph nodes appear unchanged. Extensive surgical clips are identified within bilateral iliac nodal change. No adenopathy identified. Patent fem-fem bypass. Reproductive: Choose 2. Other: Bilobed fluid collection within the presacral soft tissues and right posterior pelvic sidewall is again noted. Along the right posterior pelvic sidewall fluid collection measures 5.1 x 2.9 cm, image 70/2. Previously 4.4 x 1.5 cm. The component within the presacral region measures 4.2 by 2.0 cm, image 63/2. Previously 3.3 x 1.6 cm. Here, there is accumulation of high attenuation contrast material, image 59/7. There are several loops of small bowel which appear closely associated with this area and may have fistulous communication with the presacral fluid collection, image 64/2. Musculoskeletal: No aggressive lytic or sclerotic bone lesions. IMPRESSION: 1. In no abnormal dilatation of the small or large bowel loops to suggest bowel obstruction. 2. Bilobed fluid collection within the presacral soft tissues and right posterior pelvic sidewall is again noted and appears increased in size from previous exam. High attenuation material within the presacral component is noted which may reflect underlying fistulous communication with adjacent bowel loops. 3.  Aortic Atherosclerosis (ICD10-I70.0). Electronically Signed   By: Kerby Moors M.D.   On: 06/07/2018 11:23   Ct Image Guided Drainage By Percutaneous Catheter  Result Date: 06/08/2018 INDICATION: Remote history of cervical cancer complicated by development distal colonic stricture, post low anterior resection with diverting ileostomy,  complicated by the anastomotic leak requiring percutaneous drainage catheter placement on 09/05/2015. Unfortunately, patient re-presented to the hospital with worsening abdominal/pelvic pain with repeat CT scan of the abdomen pelvis performed 06/07/2018 demonstrating interval enlargement of indeterminate fluid collection within the right hemipelvis. Request made for CT-guided aspiration and/or drainage catheter placement for infection source control purposes. EXAM: CT IMAGE GUIDED DRAINAGE BY PERCUTANEOUS CATHETER COMPARISON:  CT abdomen and pelvis-06/07/2018; 12/04/2015; 09/03/2015; CT-guided right trans gluteal approach percutaneous drainage catheter placement-09/05/2015 MEDICATIONS: The patient is currently admitted to the hospital and receiving intravenous antibiotics. The antibiotics were administered within an appropriate time frame prior to the initiation of the procedure. ANESTHESIA/SEDATION: Moderate (conscious) sedation was employed during this procedure. A total of Versed 1.5 mg and Fentanyl 125 mcg was administered intravenously. Moderate Sedation Time:  20 minutes. The patient's level of consciousness and vital signs were monitored continuously by radiology nursing throughout the procedure under my direct supervision. CONTRAST:  None COMPLICATIONS: None immediate. PROCEDURE: Informed written consent was obtained from the patient after a discussion of the risks, benefits and alternatives to treatment. The patient was placed left lateral decubitus on the CT gantry and a pre procedural CT was performed re-demonstrating the known abscess/fluid collection within the right hemipelvis. Note, the patient was positioned left lateral decubitus as opposed to prone given the presence of her fem-fem bypass graft. The procedure was planned. A timeout was performed prior to the initiation of the procedure. The skin overlying the right buttocks was prepped and draped in the usual sterile fashion. The overlying soft  tissues were anesthetized with 1% lidocaine with epinephrine. Appropriate trajectory was planned with the use of a 22 gauge spinal needle. An 18 gauge trocar needle was advanced into the abscess/fluid collection and a short Amplatz super stiff wire was coiled within the collection. Appropriate positioning was confirmed with a limited CT scan. The tract was serially dilated allowing placement of a 10 [place] all-purpose drainage catheter. Appropriate positioning was confirmed with a limited postprocedural CT scan. Approximately 30 cc of serous, slightly blood tinged fluid was aspirated. The tube was connected to a JP bulb and sutured in place. A dressing was placed. The patient tolerated the procedure well without immediate post procedural complication. IMPRESSION: Successful CT guided placement of a 10 French all purpose drain catheter into the right hemipelvis with aspiration of 30 cc of serous, slightly blood tinged fluid. Samples were sent to the laboratory as requested by the ordering clinical team. Samples were sent for both Gram stain and cytologic analysis. Electronically Signed   By: Sandi Mariscal M.D.   On: 06/08/2018 12:50    Labs:  CBC: Recent Labs    06/07/18 0902 06/08/18 0425 06/10/18 0439  WBC 14.5* 10.1 7.3  HGB 14.1 12.8 12.6  HCT 46.3* 42.8 41.8  PLT 194 182 159    COAGS: Recent Labs    06/07/18 1531  INR 1.02    BMP: Recent Labs    06/07/18 0902 06/08/18 0425 06/10/18 0439  NA 138 140 139  K 4.2 4.1 3.8  CL 105 108 111  CO2 22 25 22   GLUCOSE 119* 114* 101*  BUN 14 8 8   CALCIUM 9.1 8.8* 8.6*  CREATININE 0.77 0.84 0.73  GFRNONAA >60 >60 >60  GFRAA >60 >60 >60    LIVER FUNCTION TESTS: Recent Labs    06/07/18 0902  BILITOT 0.5  AST 13*  ALT 9  ALKPHOS 96  PROT 6.9  ALBUMIN 3.8    TUMOR MARKERS: No results for input(s): AFPTM, CEA, CA199, CHROMGRNA in the last 8760 hours.  Assessment/Plan:  S/P placement of a drain to treat pelvic abscess on  06/08/2018 by Dr. Pascal Lux.  CT scan done today shows slight enlargement of the abscess, although this could represent flush. Images were reviewed by Dr Annamaria Boots.  Continue to flush the drain daily with 2-3 mL saline. Empty and record daily.  Return in 2 weeks with CT of the Pelvis (no need to image abdomen).  Electronically Signed: Murrell Redden PA-C 06/23/2018, 12:38 PM    Please refer to Dr. Fritz Pickerel attestation of this note for management and plan.

## 2018-07-07 ENCOUNTER — Ambulatory Visit
Admission: RE | Admit: 2018-07-07 | Discharge: 2018-07-07 | Disposition: A | Discharge status: Home / Self Care | Payer: Self-pay | Source: Ambulatory Visit | Attending: General Surgery | Admitting: General Surgery

## 2018-07-07 DIAGNOSIS — N739 Female pelvic inflammatory disease, unspecified: Secondary | ICD-10-CM

## 2018-07-07 DIAGNOSIS — R188 Other ascites: Secondary | ICD-10-CM

## 2018-07-07 HISTORY — PX: IR RADIOLOGIST EVAL & MGMT: IMG5224

## 2018-07-07 MED ORDER — IOPAMIDOL (ISOVUE-300) INJECTION 61%
100.0000 mL | Freq: Once | INTRAVENOUS | Status: AC | PRN
  Administered 2018-07-07: 100 mL via INTRAVENOUS

## 2018-07-07 NOTE — Progress Notes (Signed)
Referring Physician(s): Aviva Signs  Chief Complaint: The patient is seen in follow up today s/p pelvic abscess  History of present illness: Cathy Jones is a 58 y.o. female with history of low anterior resection with diverting ileostomy, subsequent ileostomy takedown in 2017.  She developed a fluid collection in 2017 which required percutaneous drainage; this drain was eventually removed.  She presented to [facility] in January 2020 with lower abdominal pain/discomfort.  She was found to have recurrence of her pelvic collection. She underwent percutaneous drain placement 06/08/18 and presents to IR drain clinic today for follow-up.  Patient states she has improved at home.  She has been flushing her drain daily.  Her output has been minimal.  She states her daughter did have to unclog the drain a few days ago and at that time output increased to 20 mL, but has since decreased to <10 mL/day. She reports output as thin, clear liquid. Denies fever, chills, nausea, abdominal pain, back pain, dysuria.  He has been eating and drinking per her usual.    Past Medical History:  Diagnosis Date  . Acute renal failure (ARF) ([place]) 08/07/2015  . Anemia   . Asthma    pt brought her proair inhaler 06-06-14  . Cervical cancer ([place]   . Colon polyp    hyperplastic  . Colon stricture ([place])   . Colonic obstruction ([place])   . Depression   . Family history of adverse reaction to anesthesia    pts mother experiences nausea and vomiting   . GERD (gastroesophageal reflux disease)   . History of bronchitis   . History of chemotherapy   . History of hiatal hernia   . History of radiation therapy   . Hyperlipidemia   . Hypertension   . 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 Migraine    "q couple years maybe" (08/07/2015)  . Shortness of breath dyspnea    seasonal     Past Surgical History:  Procedure Laterality Date  . ABDOMINAL HYSTERECTOMY  1994  . COLON RESECTION N/A 06/27/2015   Procedure:  LYSIS OF ADHESIONS LOW ANTERIOR RESECTION DIVERTING LOOP ILEOSTOMY;  Surgeon: Leighton Ruff, MD;  Location: [facility];  Service: General;  Laterality: N/A;  . COLON SURGERY    . COLONOSCOPY    . CYSTOSCOPY WITH STENT PLACEMENT Bilateral 06/27/2015   Procedure: CYSTOSCOPY WITH CATHETER  PLACEMENT;  Surgeon: Franchot Gallo, MD;  Location: [facility];  Service: Urology;  Laterality: Bilateral;  . DIVERTING ILEOSTOMY N/A 06/27/2015   Procedure: DIVERTING LOOP ILEOSTOMY;  Surgeon: Leighton Ruff, MD;  Location: [facility];  Service: General;  Laterality: N/A;  . FEMORAL-FEMORAL BYPASS GRAFT Bilateral 08/10/2015   Procedure: BYPASS GRAFT LEFT FEMORALTO RIGHT FEMORAL ARTERY;  Surgeon: Rosetta Posner, MD;  Location: [place];  Service: Vascular;  Laterality: Bilateral;  . FLEXIBLE SIGMOIDOSCOPY  12/12/2015   Procedure: FLEXIBLE SIGMOIDOSCOPY;  Surgeon: Leighton Ruff, MD;  Location: [facility];  Service: General;;  . ILEOSTOMY CLOSURE N/A 12/12/2015   Procedure: OPEN ILEOSTOMY REVERSAL AND PELVIC WASHOUT;  Surgeon: Leighton Ruff, MD;  Location: [facility];  Service: General;  Laterality: N/A;  . INTRAOPERATIVE ARTERIOGRAM Right 08/10/2015   Procedure: INTRA OPERATIVE ARTERIOGRAM Right Iliac Artery;  Surgeon: Rosetta Posner, MD;  Location: [place];  Service: Vascular;  Laterality: Right;  . IR GENERIC HISTORICAL  09/12/2015   IR RADIOLOGIST EVAL & MGMT 09/12/2015 Monia Sabal, PA-C [facility] INTERV RAD  . IR RADIOLOGIST EVAL & MGMT  06/23/2018  .  IR RADIOLOGIST EVAL & MGMT  07/07/2018  . IV antibiotic infusion therapy     . PERIPHERAL VASCULAR CATHETERIZATION N/A 08/09/2015   Procedure: Abdominal Aortogram w/Lower Extremity;  Surgeon: Conrad Edna, MD;  Location: [place] [facility];  Service: Cardiovascular;  Laterality: N/A;  . PERIPHERAL VASCULAR CATHETERIZATION Left 08/09/2015   Procedure: Peripheral Vascular Intervention;  Surgeon: Conrad Cordova, MD;  Location: [place] [facility];  Service: Cardiovascular;  Laterality: Left;  common iliac  .  PICC LINE PLACE PERIPHERAL ([place] HX)    . THROMBECTOMY FEMORAL ARTERY Bilateral 09/05/2015   Procedure: THROMBECTOMY Left to Right Femoral to  FEMORAL ARTERY Bypass,.;  Surgeon: Mal Misty, MD;  Location: [place];  Service: Vascular;  Laterality: Bilateral;  . THROMBECTOMY ILIAC ARTERY Right 08/10/2015   Procedure: THROMBECTOMY Right ILIAC ARTERY;  Surgeon: Rosetta Posner, MD;  Location: [employer];  Service: Vascular;  Laterality: Right;  . TUBAL LIGATION    . UPPER GASTROINTESTINAL ENDOSCOPY      Allergies: Codeine  Medications: Prior to Admission medications   Medication Sig Start Date End Date Taking? Authorizing Provider  ciprofloxacin (CIPRO) 500 MG tablet Take 1 tablet (500 mg total) by mouth 2 (two) times daily. 06/11/18   Orson Eva, MD  metroNIDAZOLE (FLAGYL) 500 MG tablet Take 1 tablet (500 mg total) by mouth 3 (three) times daily. 06/11/18   Orson Eva, MD  oxyCODONE (OXY IR/ROXICODONE) 5 MG immediate release tablet Take 1 tablet (5 mg total) by mouth every 4 (four) hours as needed for moderate pain. 06/11/18   Orson Eva, MD  Probiotic Product (PROBIOTIC PO) Take 1 tablet by mouth daily.    [provider]     Family History  Problem Relation Age of Onset  . Rectal cancer Cousin   . Hypertension Mother   . Hyperparathyroidism Mother   . Hypertension Father   . CAD Father        CABG  . Peripheral vascular disease Father   . Graves' disease Father   . Colon cancer Neg Hx   . Stomach cancer Neg Hx   . Esophageal cancer Neg Hx   . Deep vein thrombosis Neg Hx     Social History   Socioeconomic History  . Marital status: Married    Spouse name: Not on file  . Number of children: 1  . Years of education: Not on file  . Highest education level: Not on file  Occupational History  . Not on file  Social Needs  . Financial resource strain: Patient refused  . Food insecurity:    Worry: Patient refused    Inability: Patient refused  . Transportation needs:     Medical: Patient refused    Non-medical: Patient refused  Tobacco Use  . Smoking status: Current Every Day Smoker    Packs/day: 1.50    Years: 30.00    Pack years: 45.00    Types: Cigarettes  . Smokeless tobacco: Never Used  . Tobacco comment: Less than 10 cigarettes per day  Substance and Sexual Activity  . Alcohol use: No    Alcohol/week: 0.0 standard drinks  . Drug use: No  . Sexual activity: Yes  Lifestyle  . Physical activity:    Days per week: Patient refused    Minutes per session: Patient refused  . Stress: Patient refused  Relationships  . Social connections:    Talks on phone: Patient refused    Gets together: Patient refused    Attends  religious service: Patient refused    Active member of club or organization: Patient refused    Attends meetings of clubs or organizations: Patient refused    Relationship status: Patient refused  Other Topics Concern  . Not on file  Social History Narrative  . Not on file     Vital Signs: BP (!) 144/85   Pulse 84   Temp 98.6 F (37 C)   SpO2 98%   Physical Exam  NAD, ambulatory Abdomen: soft, non-tender Back:  Right TG drain in place.  Insertion site is c/d/i.  Small amount of dried drainage at insertion site.  Output appears serous.   Imaging: Ir Radiologist Eval & Mgmt  Result Date: 07/07/2018 Please refer to notes tab for details about interventional procedure. (Op Note)   Labs:  CBC: Recent Labs    06/07/18 0902 06/08/18 0425 06/10/18 0439  WBC 14.5* 10.1 7.3  HGB 14.1 12.8 12.6  HCT 46.3* 42.8 41.8  PLT 194 182 159    COAGS: Recent Labs    06/07/18 1531  INR 1.02    BMP: Recent Labs    06/07/18 0902 06/08/18 0425 06/10/18 0439  NA 138 140 139  K 4.2 4.1 3.8  CL 105 108 111  CO2 22 25 22   GLUCOSE 119* 114* 101*  BUN 14 8 8   CALCIUM 9.1 8.8* 8.6*  CREATININE 0.77 0.84 0.73  GFRNONAA >60 >60 >60  GFRAA >60 >60 >60    LIVER FUNCTION TESTS: Recent Labs    06/07/18 0902  BILITOT  0.5  AST 13*  ALT 9  ALKPHOS 96  PROT 6.9  ALBUMIN 3.8    Assessment: Pelvic fluid collection s/p percutaneous drainage 06/08/18 Patient has been doing well at home since drain placement. She has completed antibiotics.  She's had no return of fevers or abdominal pain.  Drain injection and CT Abdomen/Pelvis obtained today show improvement but not resolve of fluid collection. There was question of an enteric fistula at the time of placement, however this is not demonstrated on drain injection nor CT today.  Dr. Earleen Newport has reviewed images and discussed with the patient.  Discussed the merits of drain removal vs. Continued drainage including possible return of collection or infection requiring need for repeat drainage.  After discussion, patient elects for drain removal.  Drain removed in its entirety without complication.  No further needs in IR at this time.   Signed: Docia Barrier, PA 07/07/2018, 2:05 PM   Please refer to Dr. Earleen Newport attestation of this note for management and plan.

## 2019-10-05 ENCOUNTER — Other Ambulatory Visit: Payer: Self-pay | Admitting: Family Medicine

## 2019-10-05 ENCOUNTER — Ambulatory Visit
Admission: RE | Admit: 2019-10-05 | Discharge: 2019-10-05 | Disposition: A | Discharge status: Home / Self Care | Payer: No Typology Code available for payment source | Source: Ambulatory Visit | Attending: Family Medicine | Admitting: Family Medicine

## 2019-10-05 DIAGNOSIS — S99921A Unspecified injury of right foot, initial encounter: Secondary | ICD-10-CM

## 2019-10-05 DIAGNOSIS — R87810 Cervical high risk human papillomavirus (HPV) DNA test positive: Secondary | ICD-10-CM | POA: Insufficient documentation

## 2019-10-05 DIAGNOSIS — M8580 Other specified disorders of bone density and structure, unspecified site: Secondary | ICD-10-CM | POA: Insufficient documentation

## 2019-12-04 IMAGING — CT CT ABD-PELV W/ CM
2 of 5 series · 16 of 46 positions shown, 18 images · IV contrast (Isovue)
Comparison: 12/05/2015

CLINICAL DATA: Vomiting and constipation.

EXAM:
CT ABDOMEN AND PELVIS WITH CONTRAST
TECHNIQUE: Multidetector CT imaging of the abdomen and pelvis was performed
using the standard protocol following bolus administration of
intravenous contrast.
CONTRAST:  100mL [id number] IOPAMIDOL ([id number]) INJECTION 61%

[Series 2: axial st · axial · 0.76mm/px · z∈[+922,+1342]mm · 13 of 98 slices shown, 15 images]
[im 7/98  soft-tissue]
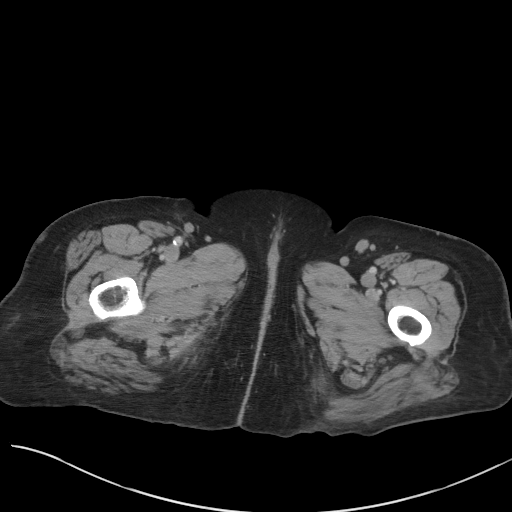
[im 7/98  bone]
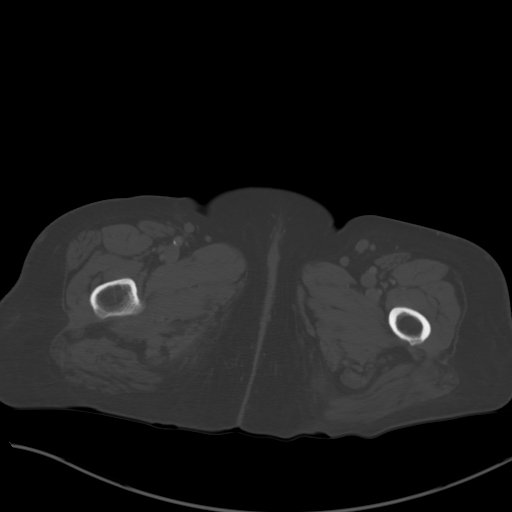
[im 13/98  soft-tissue]
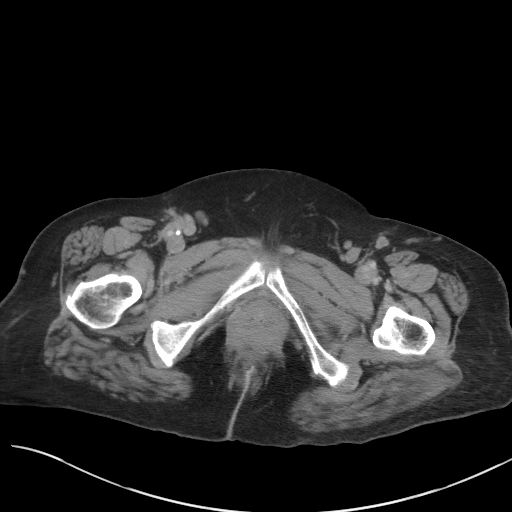
[im 19/98  soft-tissue]
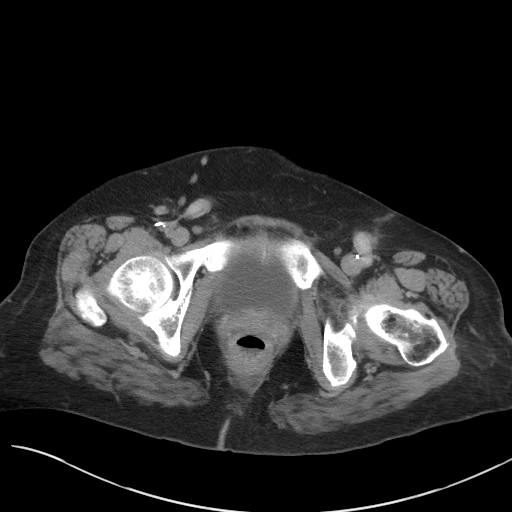
[im 31/98  soft-tissue]
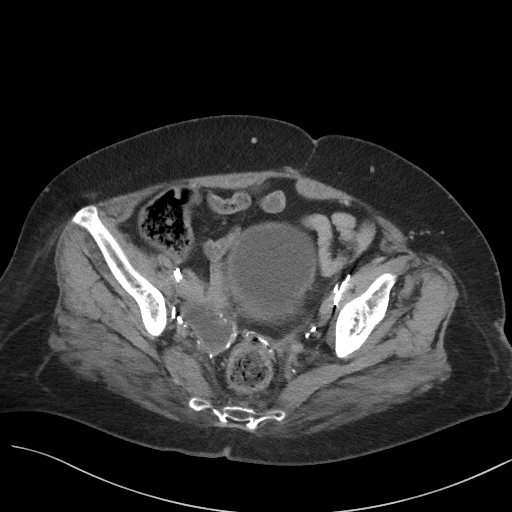
[im 37/98  soft-tissue]
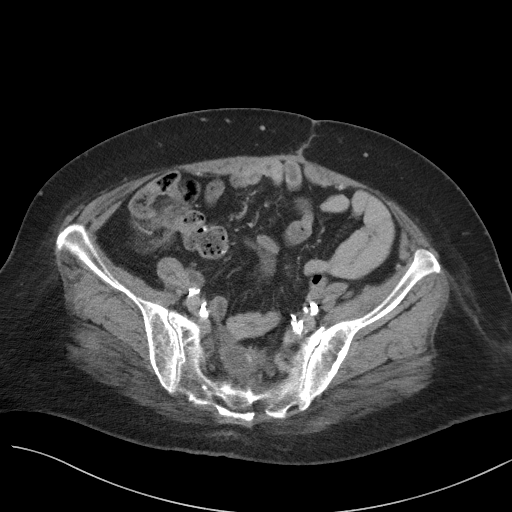
[im 43/98  soft-tissue]
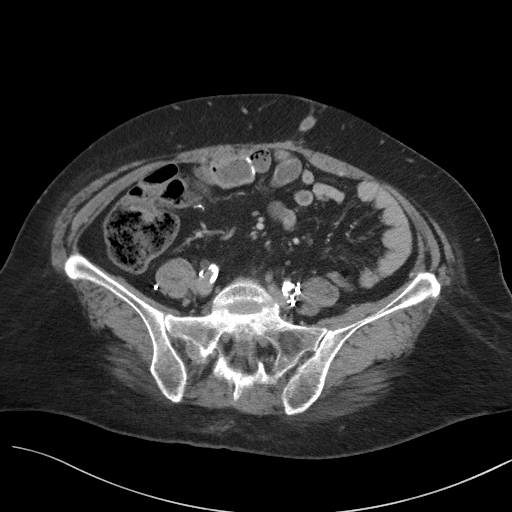
[im 49/98  soft-tissue]
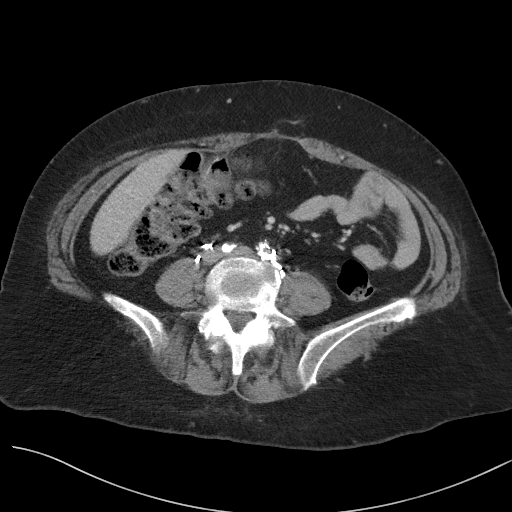
[im 55/98  soft-tissue]
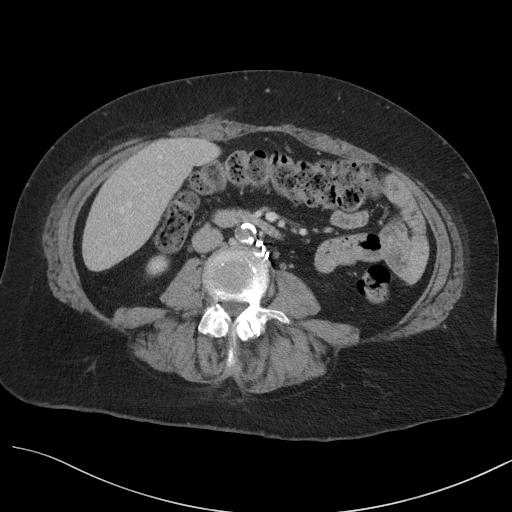
[im 61/98  soft-tissue]
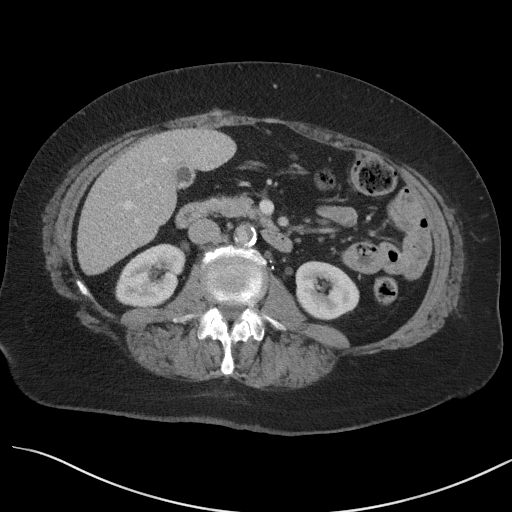
[im 61/98  bone]
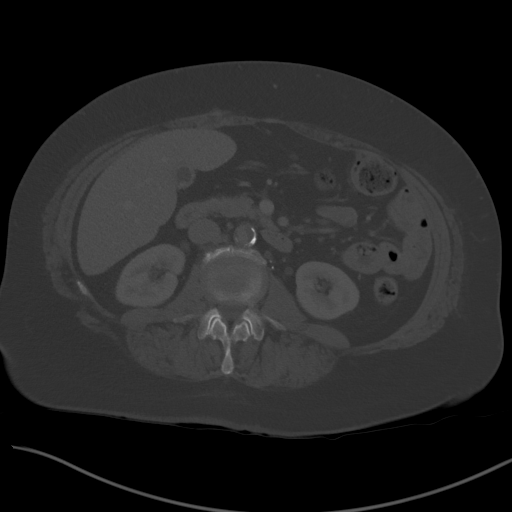
[im 67/98  soft-tissue]
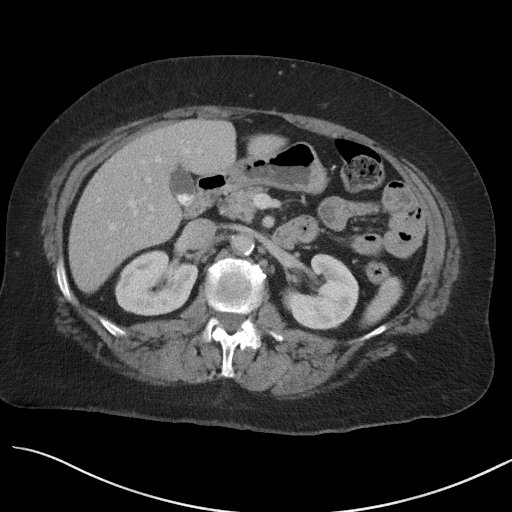
[im 79/98  soft-tissue]
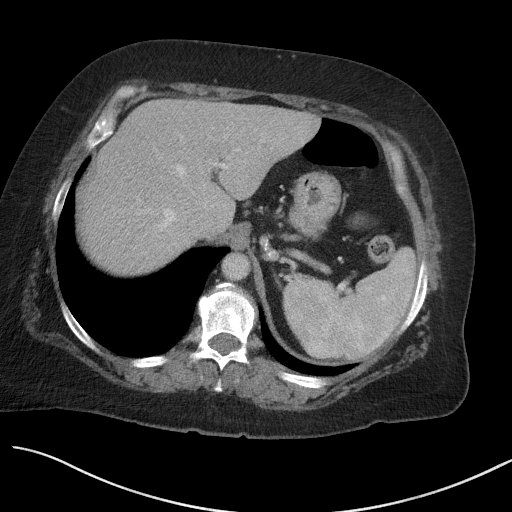
[im 85/98  soft-tissue]
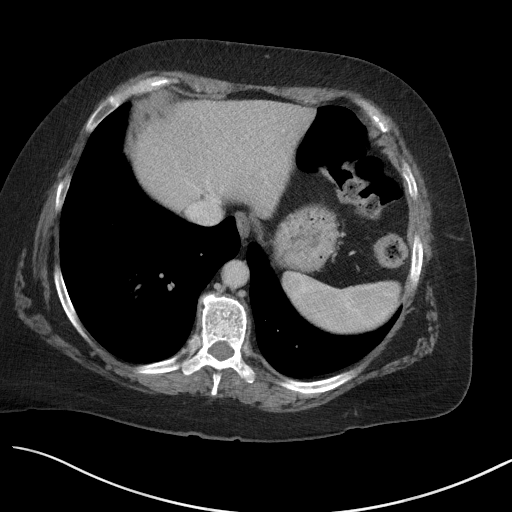
[im 91/98  soft-tissue]
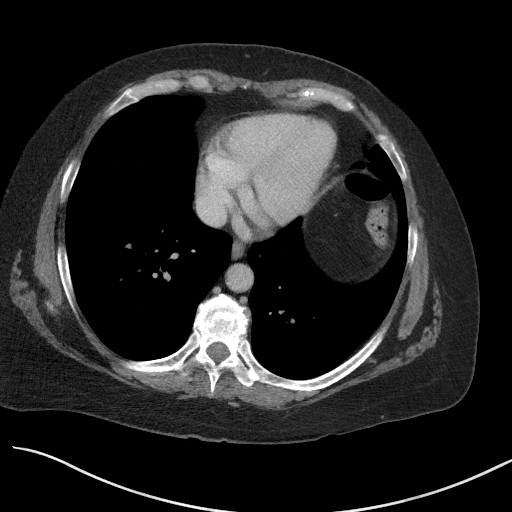

[Series 6: coronal st · coronal · 0.85mm/px · 3 of 95 slices shown]
[im 32/95  soft-tissue]
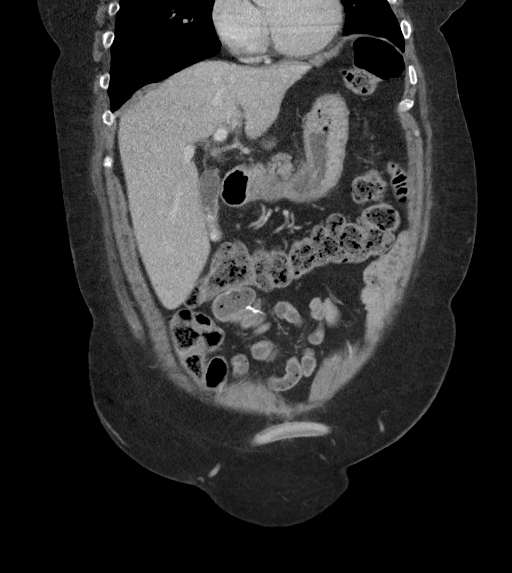
[im 42/95  soft-tissue]
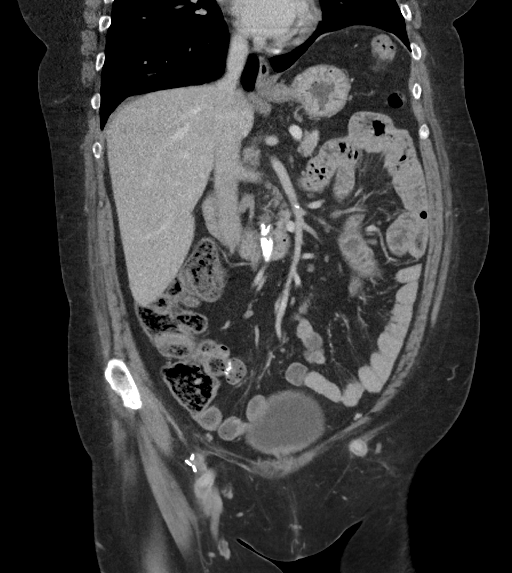
[im 53/95  soft-tissue]
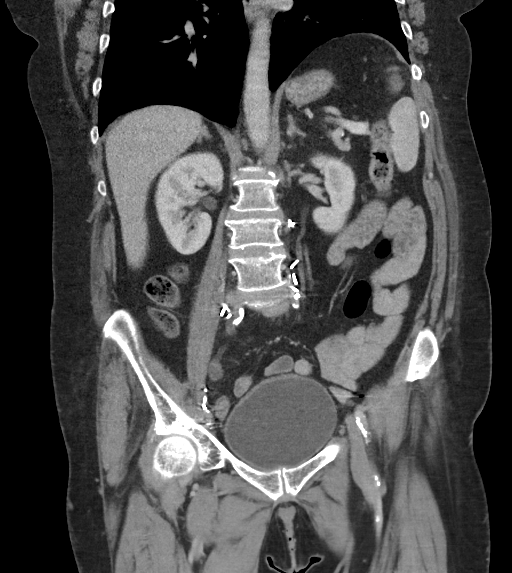

[16 of 46 positions shown; findings below may reference images not displayed]

FINDINGS: Lower chest: No acute abnormality.

Hepatobiliary: Tiny low-density structure in left lobe of liver
measuring 4 mm is too small to reliably characterize. There are
multiple small stones within the gallbladder. No gallbladder wall
thickening or pericholecystic fluid. No biliary ductal dilatation.

Pancreas: Unremarkable. No pancreatic ductal dilatation or
surrounding inflammatory changes.

Spleen: Normal in size without focal abnormality.

Adrenals/Urinary Tract: Normal adrenal glands.

The kidneys are unremarkable. No mass or hydronephrosis. The urinary
bladder appears normal.

Stomach/Bowel: Stomach appears normal. No small bowel dilatation
identified. Status post low anterior resection. Interval reversal of
previous ileostomy.

Vascular/Lymphatic: Aortic atherosclerosis without aneurysm. Porta
hepatic lymph node measures 1.3 cm, image [DATE]. Previously 1.2 cm.
Small left retroperitoneal lymph nodes appear unchanged. Extensive
surgical clips are identified within bilateral iliac nodal change.
No adenopathy identified. Patent fem-fem bypass.

Reproductive: Choose 2.

Other: Bilobed fluid collection within the presacral soft tissues
and right posterior pelvic sidewall is again noted. Along the right
posterior pelvic sidewall fluid collection measures 5.1 x 2.9 cm,
image 70/2. Previously 4.4 x 1.5 cm. The component within the
presacral region measures 4.2 by 2.0 cm, image 63/2. Previously
x 1.6 cm. Here, there is accumulation of high attenuation contrast
material, image 59/7. There are several loops of small bowel which
appear closely associated with this area and may have fistulous
communication with the presacral fluid collection, image 64/2.

Musculoskeletal: No aggressive lytic or sclerotic bone lesions.
IMPRESSION: 1. In no abnormal dilatation of the small or large bowel loops to
suggest bowel obstruction.
2. Bilobed fluid collection within the presacral soft tissues and
right posterior pelvic sidewall is again noted and appears increased
in size from previous exam. High attenuation material within the
presacral component is noted which may reflect underlying fistulous
communication with adjacent bowel loops.
3.  Aortic Atherosclerosis ([id number]).

## 2019-12-05 IMAGING — CT CT IMAGE GUIDED DRAINAGE BY PERCUTANEOUS CATHETER
1 of 4 series · 11 of 32 positions shown, 17 images · non-contrast
Comparison: CT abdomen and pelvis-06/07/2018;

INDICATION: Remote history of cervical cancer complicated by development distal
colonic stricture, post low anterior resection with diverting
ileostomy, complicated by the anastomotic leak requiring
percutaneous drainage catheter placement on 09/05/2015.

[Series 2: i-spiral 5.0 b40f · axial · 0.63mm/px · z∈[+803,+918]mm · 11 of 41 slices shown, 17 images]
[im 4/41  soft-tissue]
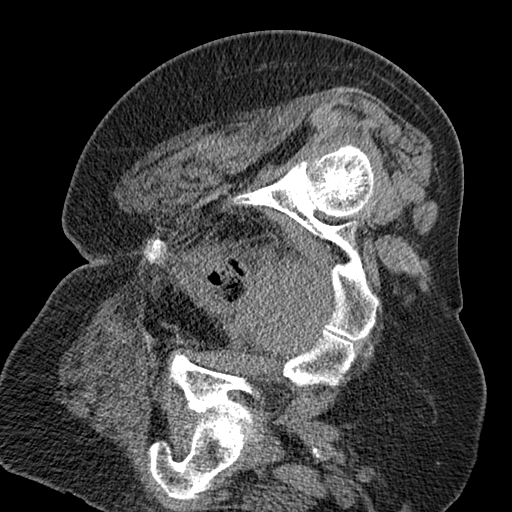
[im 4/41  bone]
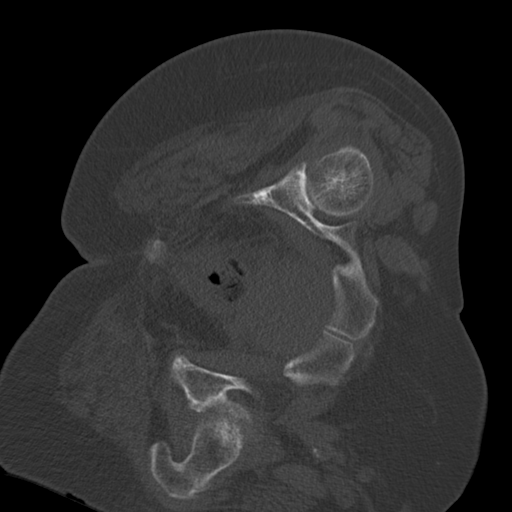
[im 7/41  soft-tissue]
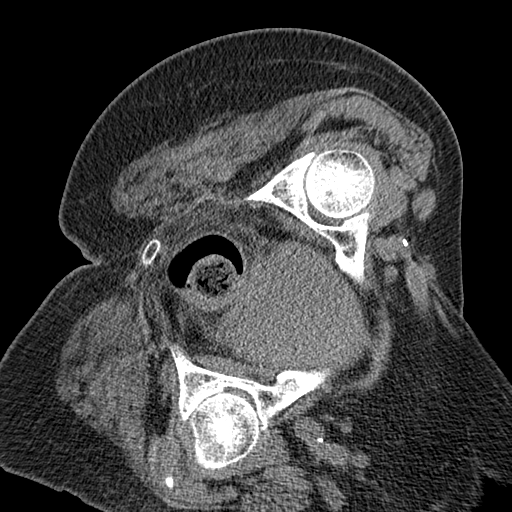
[im 11/41  soft-tissue]
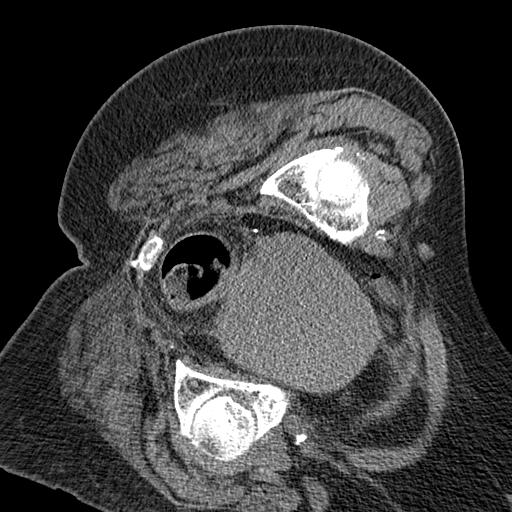
[im 14/41  soft-tissue]
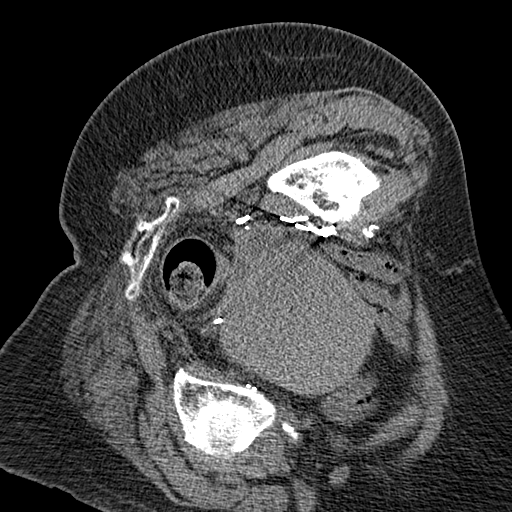
[im 17/41  soft-tissue]
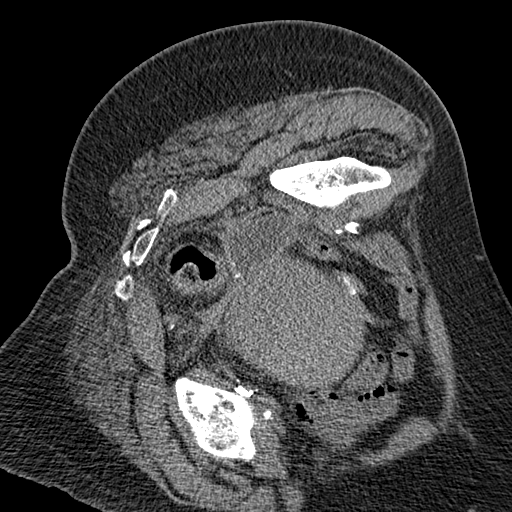
[im 21/41  soft-tissue]
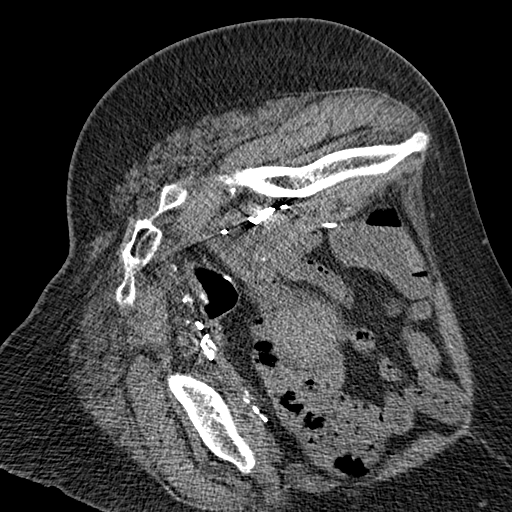
[im 24/41  soft-tissue]
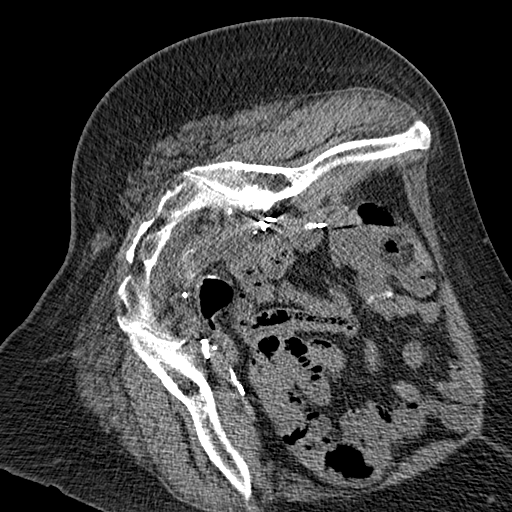
[im 27/41  soft-tissue]
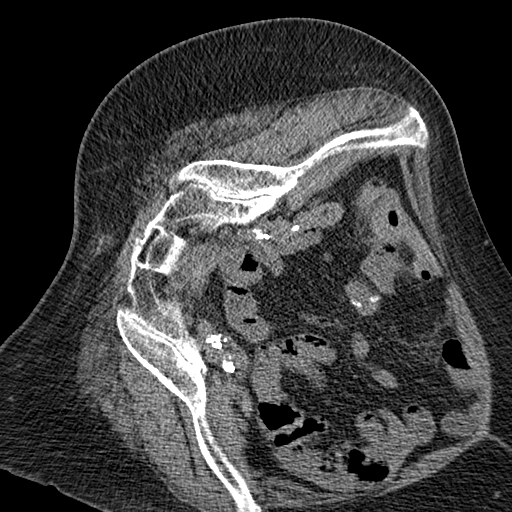
[im 27/41  lung]
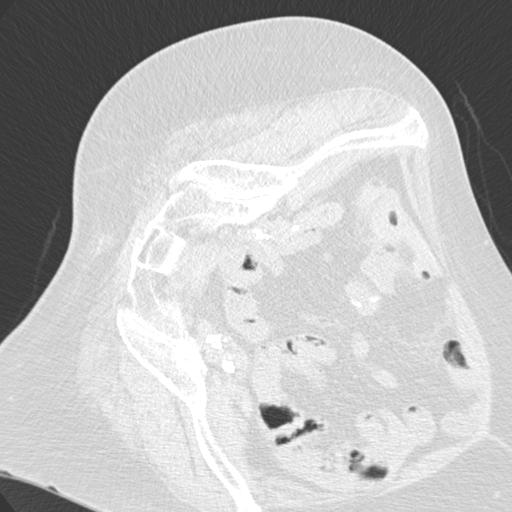
[im 31/41  soft-tissue]
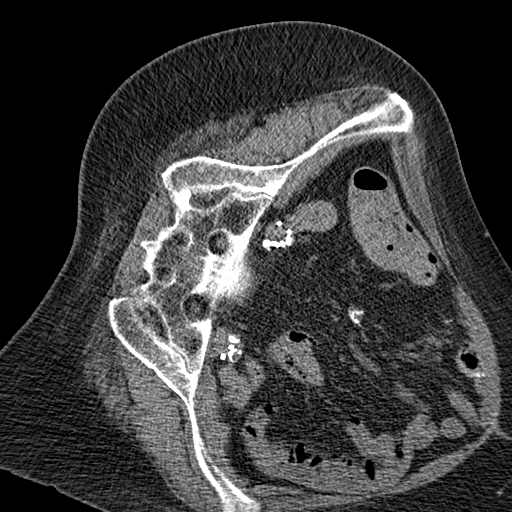
[im 31/41  lung]
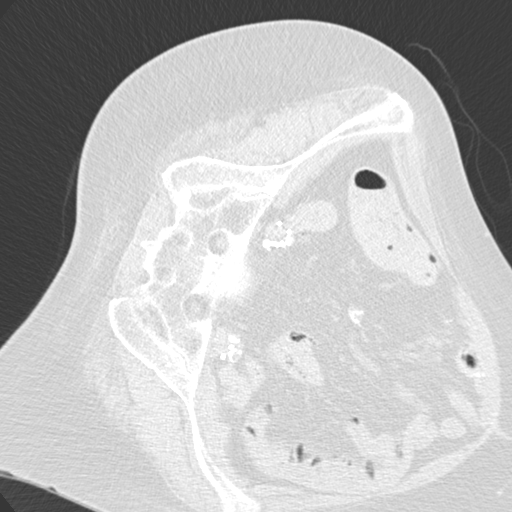
[im 31/41  bone]
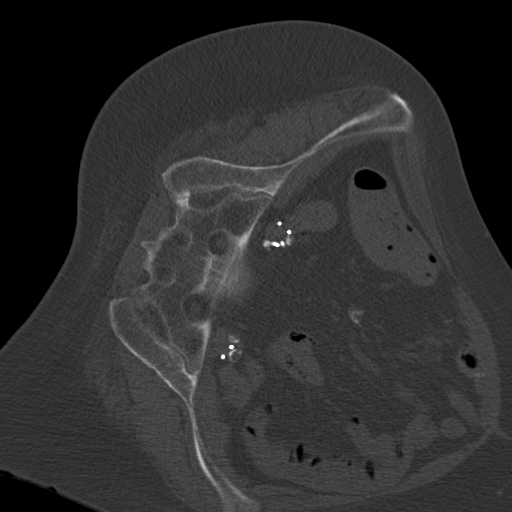
[im 34/41  soft-tissue]
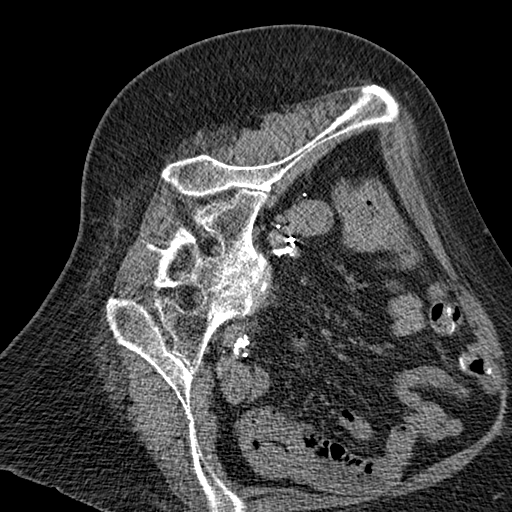
[im 34/41  lung]
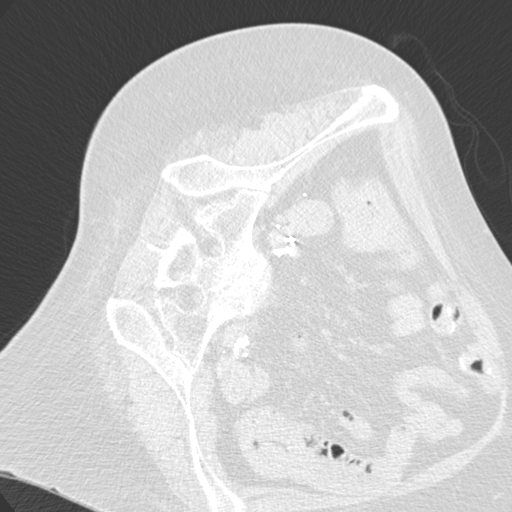
[im 37/41  soft-tissue]
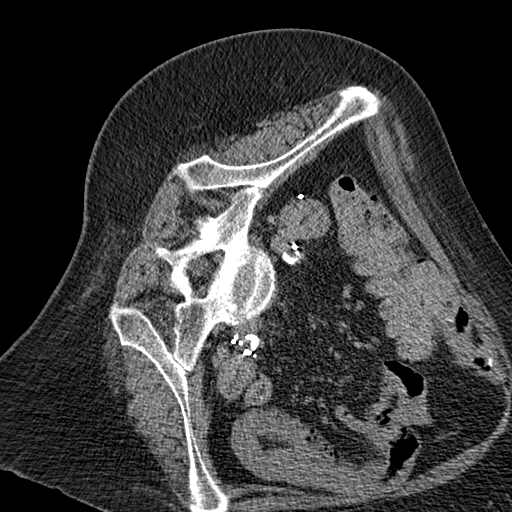
[im 37/41  lung]
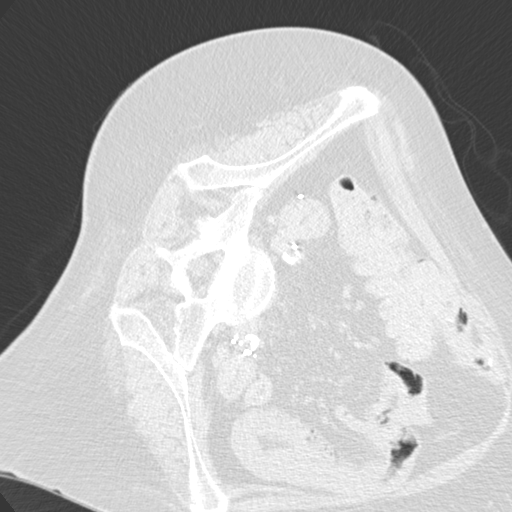

[11 of 32 positions shown; findings below may reference images not displayed]

Unfortunately, patient re-presented to the hospital with worsening
abdominal/pelvic pain with repeat CT scan of the abdomen pelvis
performed 06/07/2018 demonstrating interval enlargement of
indeterminate fluid collection within the right hemipelvis.

Request made for CT-guided aspiration and/or drainage catheter
placement for infection source control purposes.

EXAM:
CT IMAGE GUIDED DRAINAGE BY PERCUTANEOUS CATHETER
12/04/2015;
09/03/2015; CT-guided right trans gluteal approach percutaneous
drainage catheter placement-09/05/2015

MEDICATIONS:
The patient is currently admitted to the hospital and receiving
intravenous antibiotics. The antibiotics were administered within an
appropriate time frame prior to the initiation of the procedure.

ANESTHESIA/SEDATION:
Moderate (conscious) sedation was employed during this procedure. A
total of Versed 1.5 mg and Fentanyl 125 mcg was administered
intravenously.

Moderate Sedation Time: 20 minutes. The patient's level of
consciousness and vital signs were monitored continuously by
radiology nursing throughout the procedure under my direct
supervision.

CONTRAST:  None

COMPLICATIONS:
None immediate.

PROCEDURE:
Informed written consent was obtained from the patient after a
discussion of the risks, benefits and alternatives to treatment. The
patient was placed left lateral decubitus on the CT gantry and a pre
procedural CT was performed re-demonstrating the known abscess/fluid
collection within the right hemipelvis. Note, the patient was
positioned left lateral decubitus as opposed to prone given the
presence of her fem-fem bypass graft. The procedure was planned. A
timeout was performed prior to the initiation of the procedure.

The skin overlying the right buttocks was prepped and draped in the
usual sterile fashion. The overlying soft tissues were anesthetized
with 1% lidocaine with epinephrine. Appropriate trajectory was
planned with the use of a 22 gauge spinal needle. An 18 gauge trocar
needle was advanced into the abscess/fluid collection and a short
Amplatz super stiff wire was coiled within the collection.
Appropriate positioning was confirmed with a limited CT scan. The
tract was serially dilated allowing placement of a 10 French
all-purpose drainage catheter. Appropriate positioning was confirmed
with a limited postprocedural CT scan.

Approximately 30 cc of serous, slightly blood tinged fluid was
aspirated. The tube was connected to a JP bulb and sutured in place.
A dressing was placed. The patient tolerated the procedure well
without immediate post procedural complication.
IMPRESSION: Successful CT guided placement of a 10 French all purpose drain
catheter into the right hemipelvis with aspiration of 30 cc of
serous, slightly blood tinged fluid. Samples were sent to the
laboratory as requested by the ordering clinical team. Samples were
sent for both Gram stain and cytologic analysis.

## 2020-01-03 IMAGING — RF DG SINUS / FISTULA TRACT / ABSCESSOGRAM
4 series · 10 of 10 positions shown · non-contrast
Comparison: none

INDICATION: 57-year-old female with pelvic fluid and transgluteal drain.

[Series 1: one shot · 1 of 1 slices shown (1 of 2)]
[im 1/1]
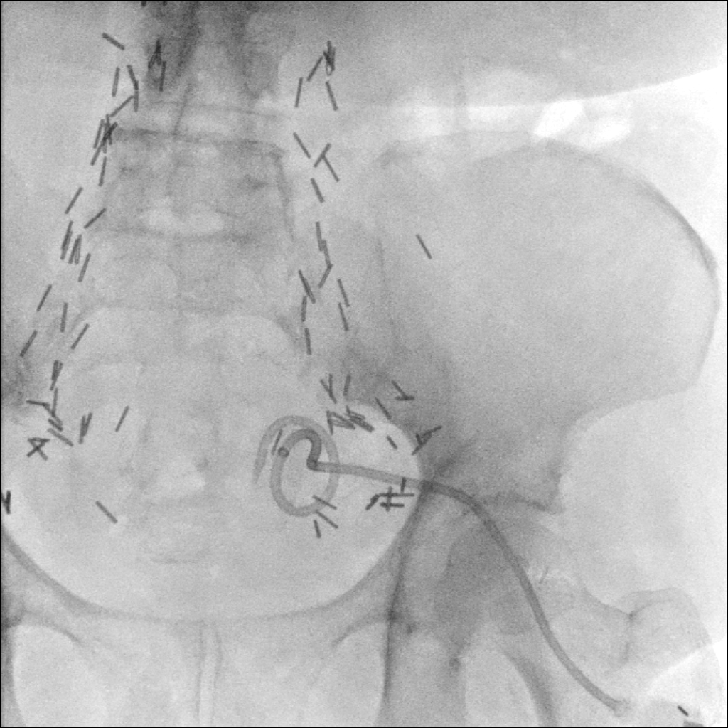

[Series 2: sequence · 4 of 95 frames shown (1 of 2)]
[frame 15/95]
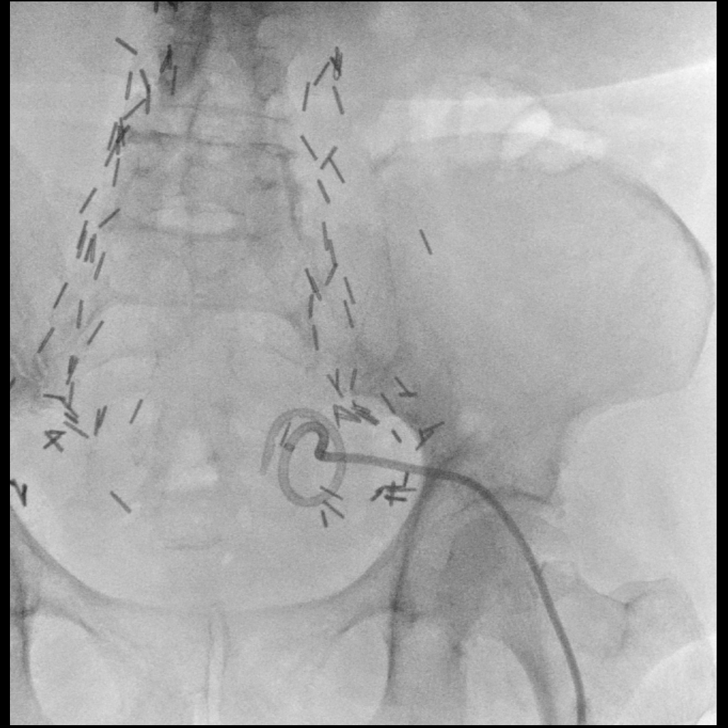
[frame 48/95]
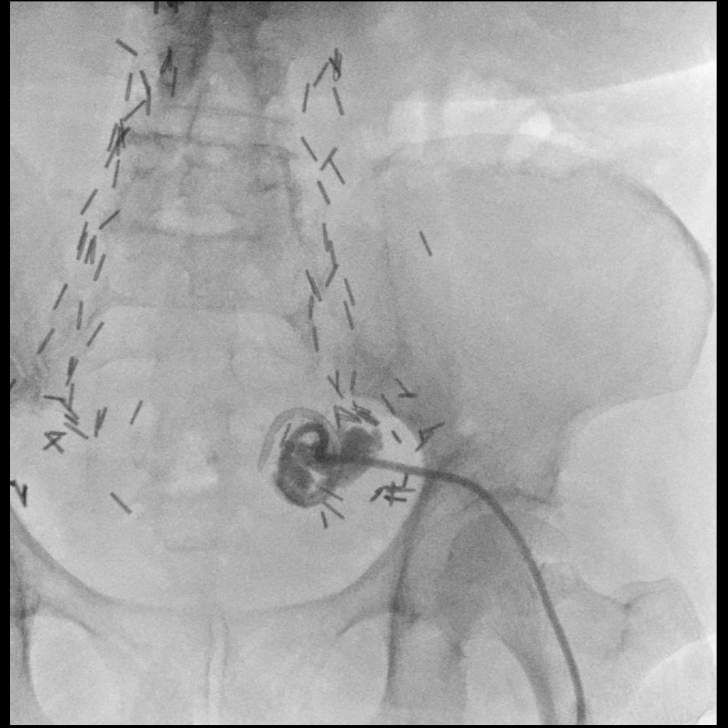
[frame 80/95]
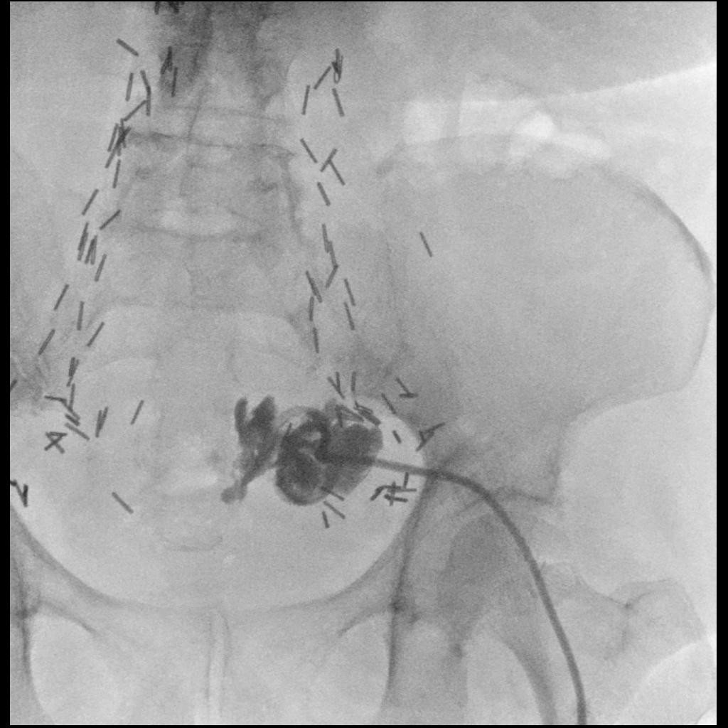
[frame 81/95]
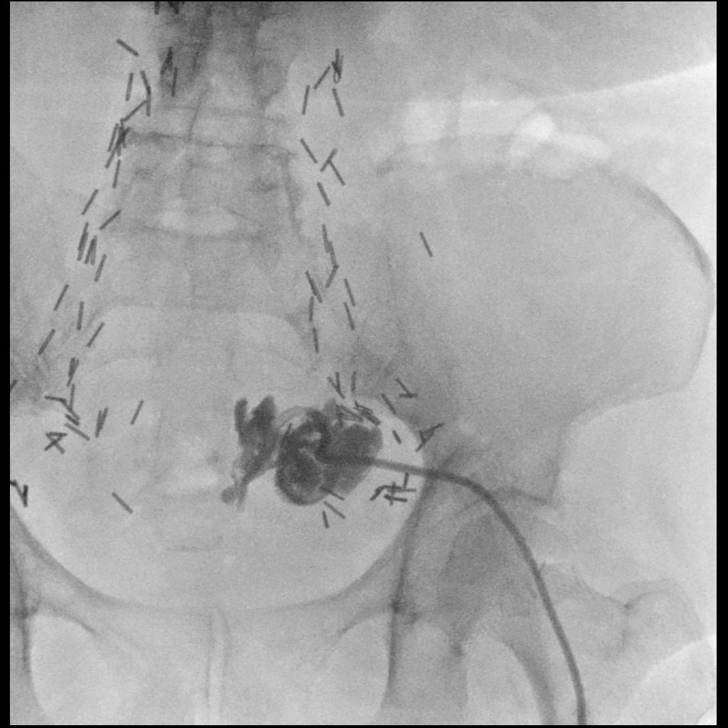

[Series 3: one shot · 1 of 1 slices shown (2 of 2)]
[im 1/1]
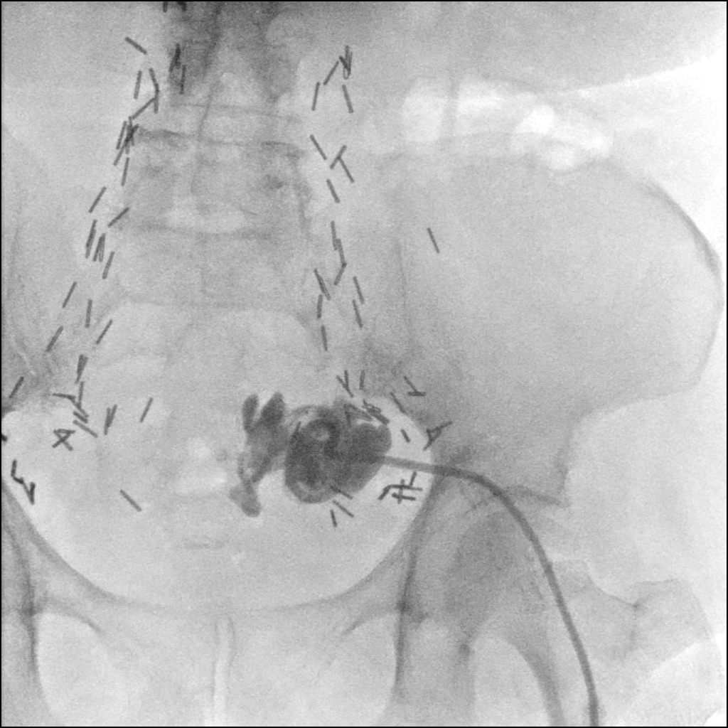

[Series 4: sequence · 4 of 77 frames shown (2 of 2)]
[frame 12/77]
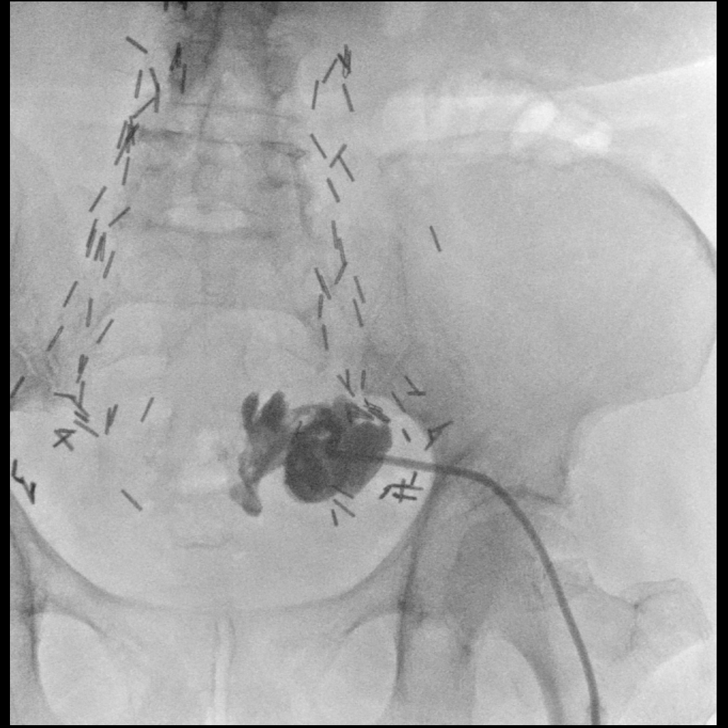
[frame 39/77]
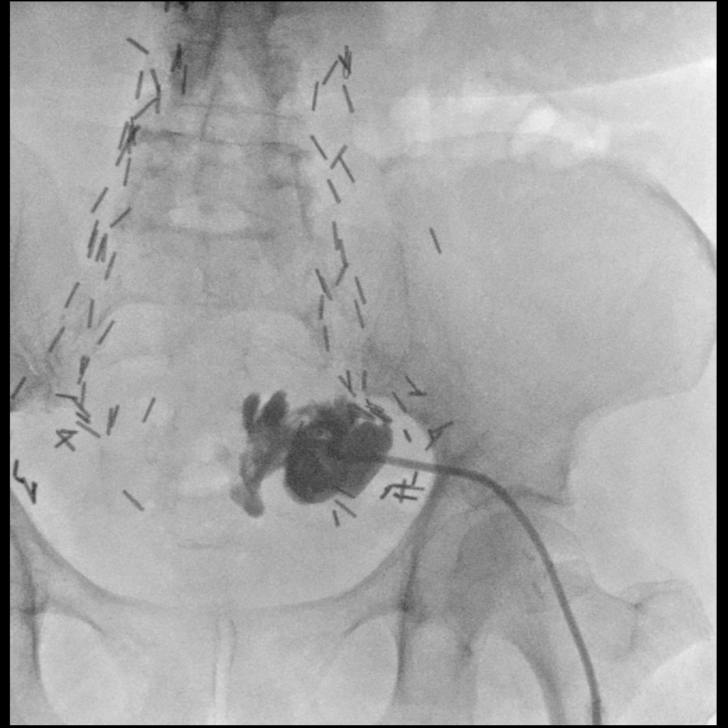
[frame 66/77]
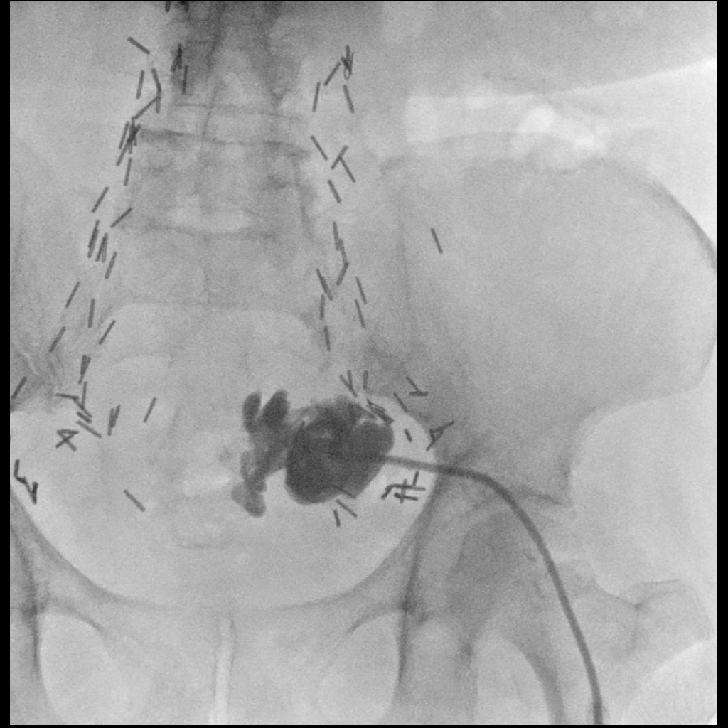
[frame 69/77]
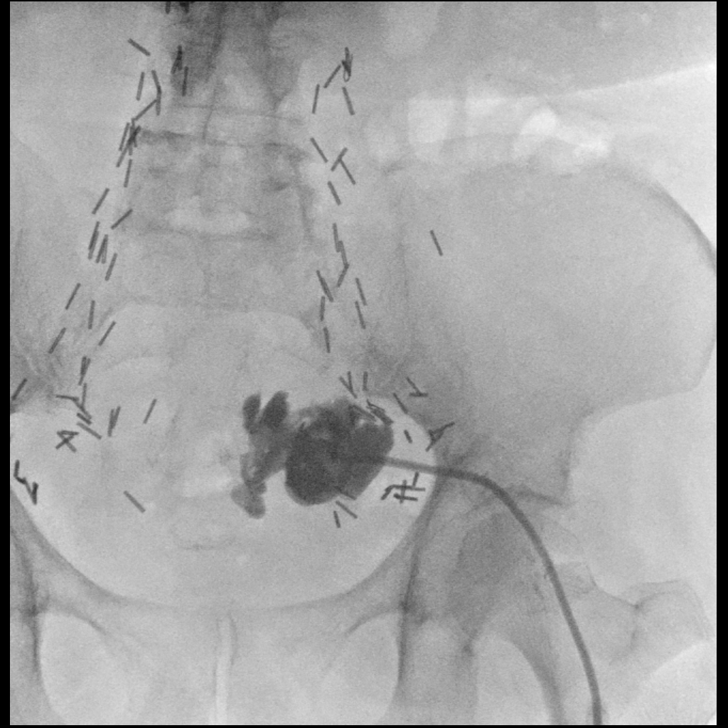

[10 of 10 positions shown; findings below may reference images not displayed]

EXAM:
IMAGE GUIDED INJECTION OF EXISTING DRAIN

MEDICATIONS:
NONE

ANESTHESIA/SEDATION:
NONE

COMPLICATIONS:
NONE

PROCEDURE:
Informed written consent was obtained from the patient after a
thorough discussion of the procedural risks, benefits and
alternatives. All questions were addressed. Maximal Sterile Barrier
Technique was utilized including caps, mask, sterile gowns, sterile
gloves, sterile drape, hand hygiene and skin antiseptic. A timeout
was performed prior to the initiation of the procedure.

Patient is position prone position on the fluoroscopy table.

The transgluteal drain was injected after scout images were
acquired. Multiple images acquired.

The catheter was then flushed and bag was attached.

The patient was then transported to CT for CT imaging.

Patient tolerated procedure well and remained hemodynamically stable
throughout.

No complications were encountered and no significant blood loss.
FINDINGS: Small fluid collection persists in the left abdomen adjacent
surgical clips. There is no fistulous connection to hollow viscus.
IMPRESSION: Drain injection demonstrates small residual fluid with no fistula.

Surgical changes.

## 2020-01-03 IMAGING — CT CT PELVIS W/ CM
2 of 3 series · 11 of 46 positions shown, 12 images · IV contrast (iopamidol)
Comparison: Multiple prior, most recent 06/23/2018, 06/11/2018,
06/07/2018, 12/05/2015

CLINICAL DATA: 57-year-old female with a history of prior partial
colectomy. Right-sided pelvic fluid collection has persisted and
recurred, most recently with replacement of a drain 06/08/2018.

The patient reports decreasing output less than 5 cc per day and
complains of significant discomfort.
EXAM:
CT PELVIS WITH CONTRAST
TECHNIQUE: Multidetector CT imaging of the pelvis was performed using the
standard protocol following the bolus administration of intravenous
contrast.
Additional contrast is present via the transgluteal drain in the
right pelvis.
CONTRAST:  100mL [id number] IOPAMIDOL ([id number]) INJECTION 61%

[Series 2: pelvis 5.00 br40 s3 ax · axial · 0.57mm/px · z∈[+1402,+1617]mm · 8 of 51 slices shown, 9 images]
[im 4/51  soft-tissue]
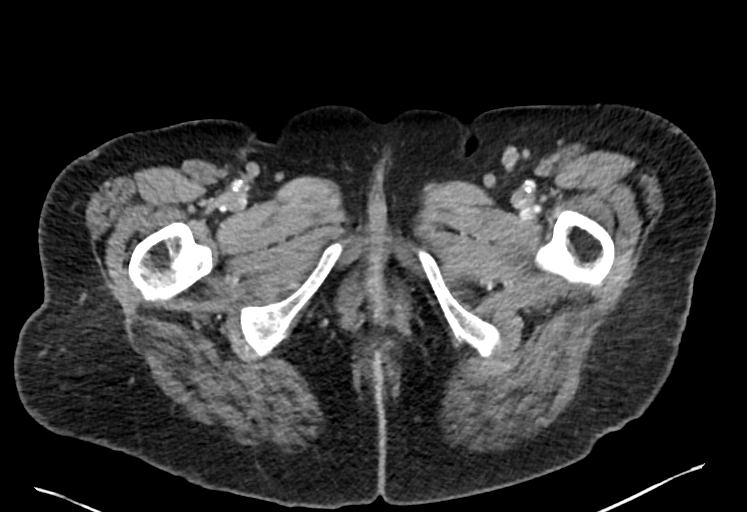
[im 4/51  bone]
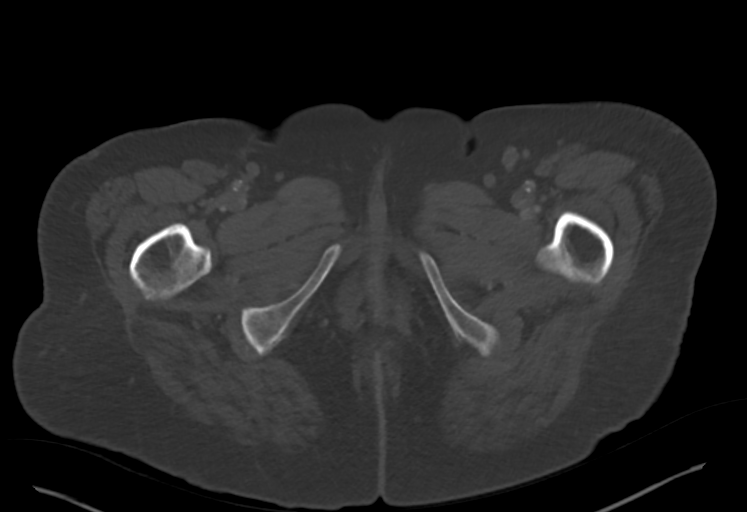
[im 10/51  soft-tissue]
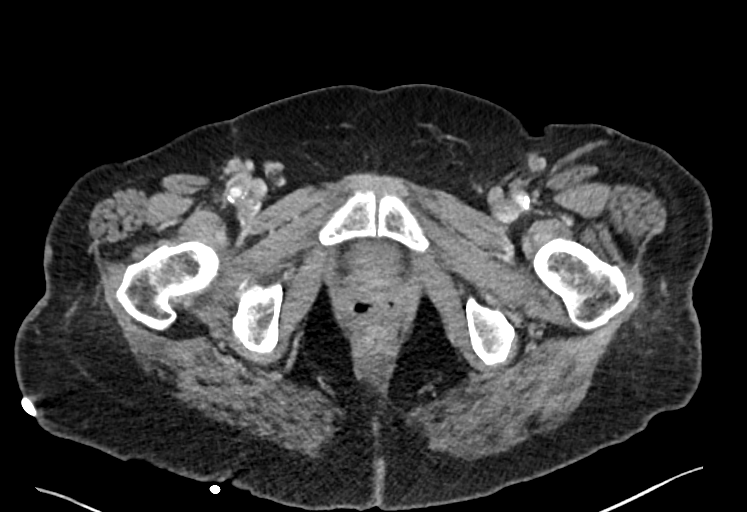
[im 17/51  soft-tissue]
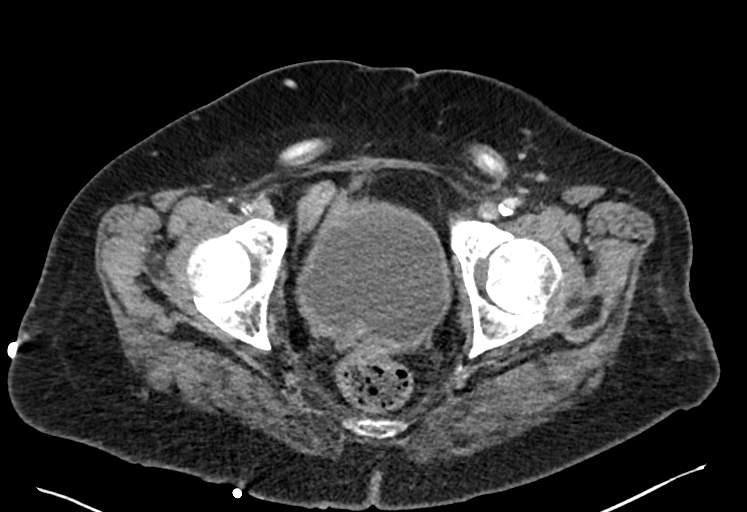
[im 23/51  soft-tissue]
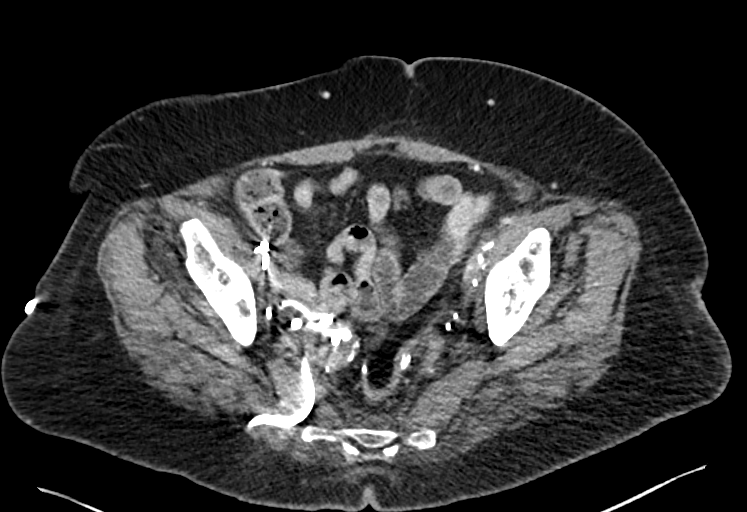
[im 28/51  soft-tissue]
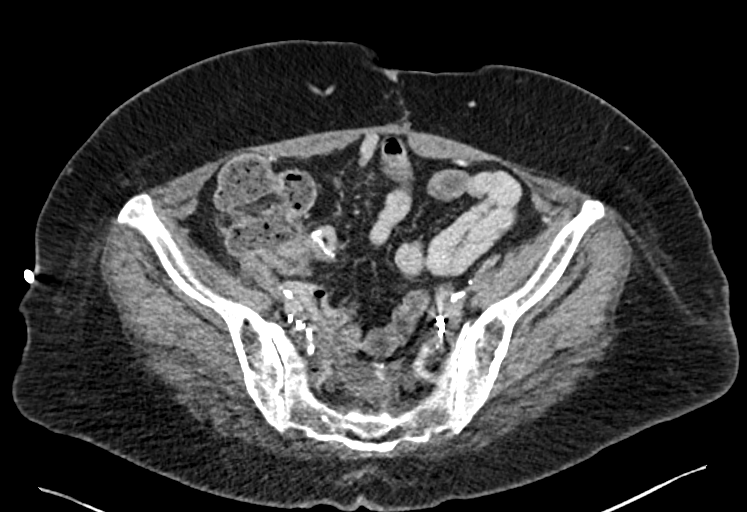
[im 34/51  soft-tissue]
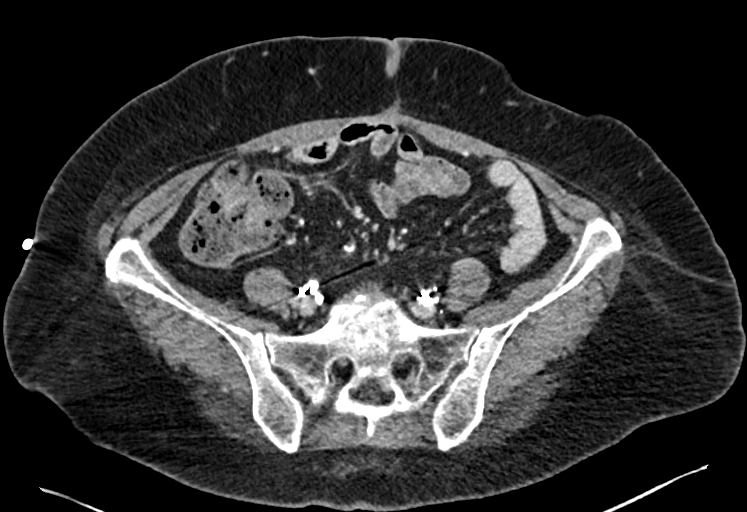
[im 41/51  soft-tissue]
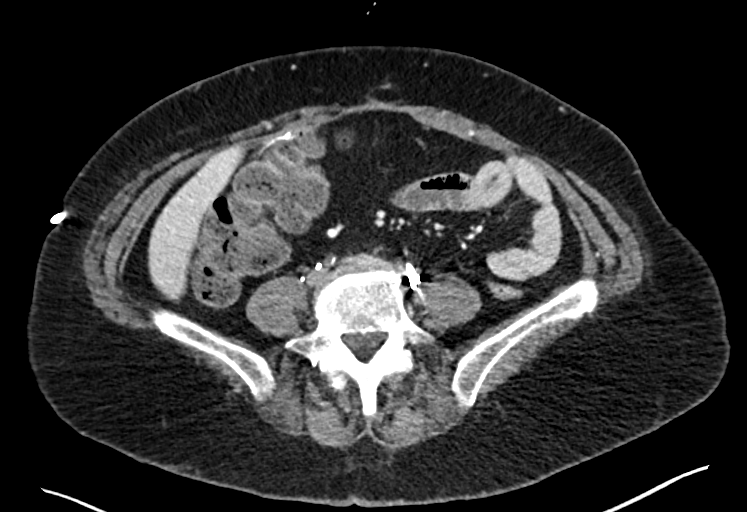
[im 47/51  soft-tissue]
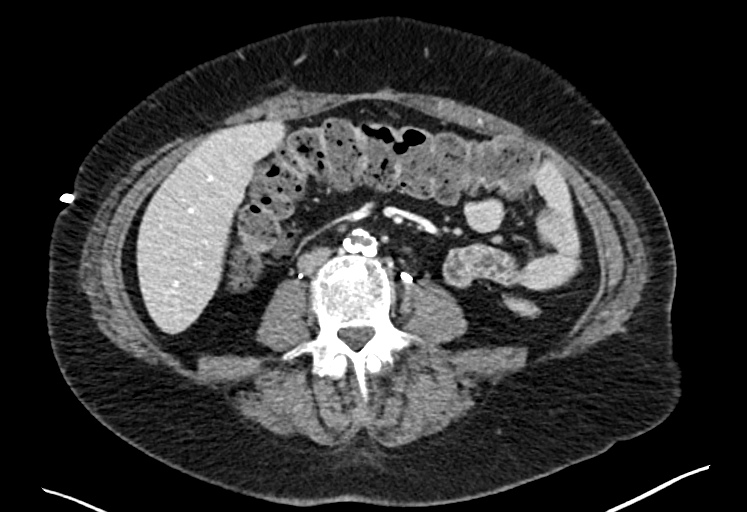

[Series 4: pelvis 2.00 br40 s3 cor · coronal · 0.50mm/px · 3 of 144 slices shown]
[im 48/144  soft-tissue]
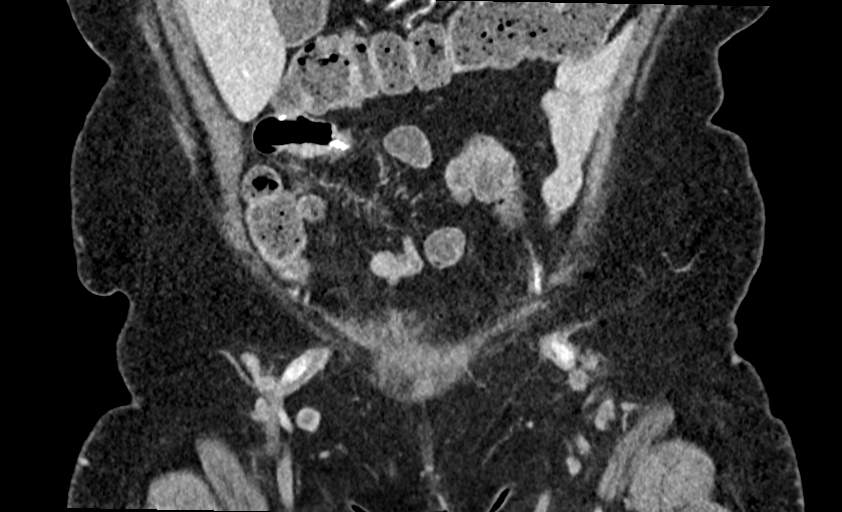
[im 64/144  soft-tissue]
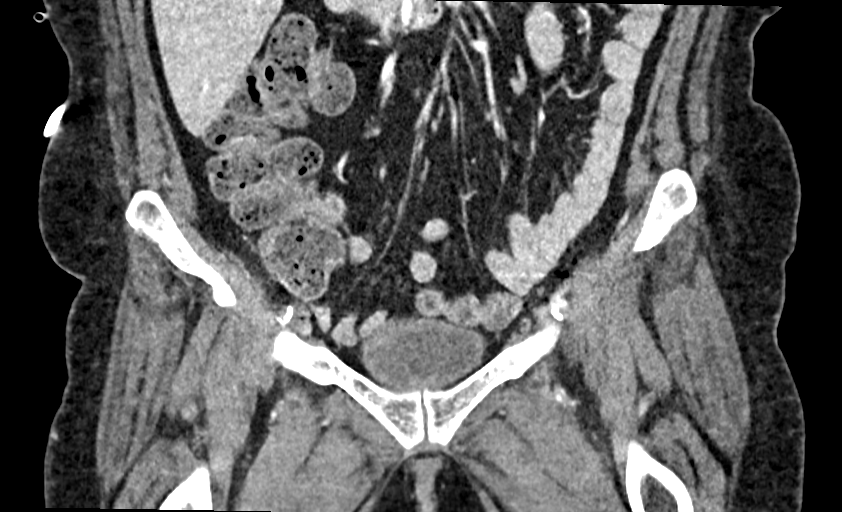
[im 80/144  soft-tissue]
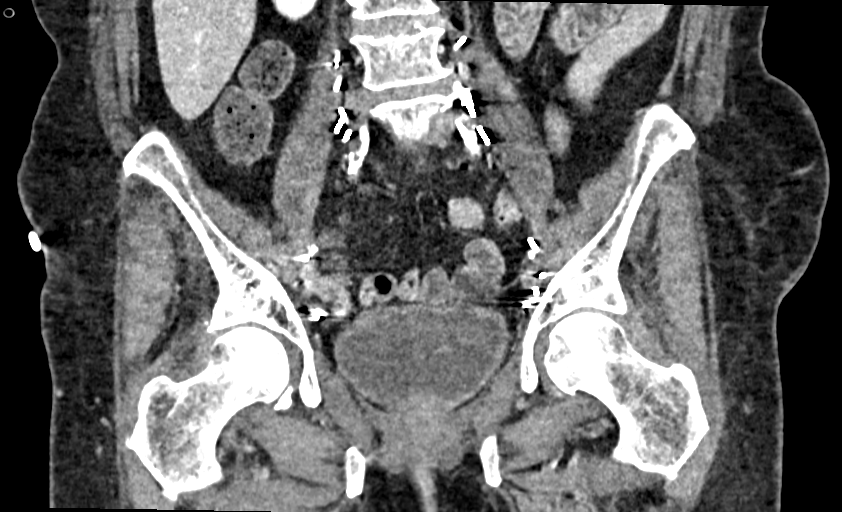

[11 of 46 positions shown; findings below may reference images not displayed]

FINDINGS: Urinary Tract: Unremarkable course of the ureters. Unremarkable
urinary bladder.

Bowel: Unremarkable appearance of the visualized colon and small
bowel. Wall enhancement within normal limits. Small bowel loops
within the pelvis are inseparable from surgical changes of the right
abdomen. The residual fluid collection within the right pelvis is
inseparable from small bowel loops of the ileum.

Contrast injected through the percutaneous transgluteal drain is
present only in the small residual fluid collection. There is no
evidence of fistulous connection 2 hollow viscus.

Vascular/Lymphatic: Occlusion of distal aorta, chronic. Occlusion of
bilateral iliac arteries. The femoral-femoral graft remains patent
as well as the proximal femoral arteries. The collateral pathway
with which the femoral bypass remains patent is not well visualized
given the significant streak artifact in the pelvis from surgical
clips. The suspected flow is via the [place] pathway.

No evidence of adenopathy.

Reproductive:  Hysterectomy

Other: Surgical changes of surgical clips in the bilateral
hemipelvis. Transgluteal drain remains. The fluid collection within
the right hemipelvis measures 4.6 cm in greatest length, which is
unchanged from the most recent CT relatively unchanged from
06/11/2018 and similar in configuration and size to the CT of
12/04/2015. The contrast injected into the cavity is superficial,
and does not appear to connect to hollow viscus.

Musculoskeletal: Degenerative changes of the spine. No displaced
fracture.
IMPRESSION: Right trans gluteal drain terminates within a small persisting fluid
collection of the right pelvis. The size is relatively unchanged
over time, with no evidence of a fistula. Given that the contrast
injected does not fill the entire space, some of the low-density
tissue may represent scarring and not fluid.

Chronic aortic occlusion and bilateral iliac occlusions with patent
femoral-femoral bypass.

Additional incidental findings as above.

## 2020-08-13 ENCOUNTER — Other Ambulatory Visit (HOSPITAL_COMMUNITY): Payer: Self-pay | Admitting: Internal Medicine

## 2020-08-13 ENCOUNTER — Other Ambulatory Visit: Payer: Self-pay | Admitting: Internal Medicine

## 2020-08-13 DIAGNOSIS — R0989 Other specified symptoms and signs involving the circulatory and respiratory systems: Secondary | ICD-10-CM

## 2020-08-13 DIAGNOSIS — I739 Peripheral vascular disease, unspecified: Secondary | ICD-10-CM

## 2020-08-23 ENCOUNTER — Ambulatory Visit (HOSPITAL_COMMUNITY)
Admission: RE | Admit: 2020-08-23 | Discharge: 2020-08-23 | Disposition: A | Discharge status: Home / Self Care | Payer: 59 | Source: Ambulatory Visit | Attending: Internal Medicine | Admitting: Internal Medicine

## 2020-08-23 ENCOUNTER — Other Ambulatory Visit: Payer: Self-pay

## 2020-08-23 DIAGNOSIS — I739 Peripheral vascular disease, unspecified: Secondary | ICD-10-CM

## 2020-08-23 DIAGNOSIS — R0989 Other specified symptoms and signs involving the circulatory and respiratory systems: Secondary | ICD-10-CM | POA: Insufficient documentation

## 2020-09-26 ENCOUNTER — Other Ambulatory Visit: Payer: Self-pay | Admitting: Internal Medicine

## 2020-09-26 ENCOUNTER — Other Ambulatory Visit (HOSPITAL_COMMUNITY): Payer: Self-pay | Admitting: Internal Medicine

## 2020-09-26 DIAGNOSIS — R1900 Intra-abdominal and pelvic swelling, mass and lump, unspecified site: Secondary | ICD-10-CM

## 2020-10-04 ENCOUNTER — Encounter (HOSPITAL_COMMUNITY): Payer: Self-pay

## 2020-10-04 ENCOUNTER — Ambulatory Visit (HOSPITAL_COMMUNITY): Payer: 59

## 2020-11-05 DIAGNOSIS — I739 Peripheral vascular disease, unspecified: Secondary | ICD-10-CM | POA: Insufficient documentation

## 2020-11-05 DIAGNOSIS — E559 Vitamin D deficiency, unspecified: Secondary | ICD-10-CM | POA: Insufficient documentation

## 2020-11-05 DIAGNOSIS — Z8541 Personal history of malignant neoplasm of cervix uteri: Secondary | ICD-10-CM | POA: Insufficient documentation

## 2020-11-05 DIAGNOSIS — F17200 Nicotine dependence, unspecified, uncomplicated: Secondary | ICD-10-CM | POA: Insufficient documentation

## 2020-11-05 DIAGNOSIS — I6529 Occlusion and stenosis of unspecified carotid artery: Secondary | ICD-10-CM | POA: Insufficient documentation

## 2020-12-28 ENCOUNTER — Other Ambulatory Visit: Payer: Self-pay | Admitting: Internal Medicine

## 2020-12-28 ENCOUNTER — Other Ambulatory Visit (HOSPITAL_COMMUNITY): Payer: Self-pay | Admitting: Internal Medicine

## 2020-12-28 DIAGNOSIS — R1900 Intra-abdominal and pelvic swelling, mass and lump, unspecified site: Secondary | ICD-10-CM

## 2021-01-15 ENCOUNTER — Ambulatory Visit (HOSPITAL_COMMUNITY): Payer: 59

## 2021-01-15 ENCOUNTER — Encounter (HOSPITAL_COMMUNITY): Payer: Self-pay

## 2021-01-17 ENCOUNTER — Other Ambulatory Visit: Payer: Self-pay

## 2021-01-17 ENCOUNTER — Ambulatory Visit (HOSPITAL_COMMUNITY)
Admission: RE | Admit: 2021-01-17 | Discharge: 2021-01-17 | Disposition: A | Discharge status: Home / Self Care | Payer: 59 | Source: Ambulatory Visit | Attending: Internal Medicine | Admitting: Internal Medicine

## 2021-01-17 DIAGNOSIS — R1900 Intra-abdominal and pelvic swelling, mass and lump, unspecified site: Secondary | ICD-10-CM | POA: Diagnosis not present

## 2021-01-17 LAB — POCT I-STAT CREATININE: Creatinine, Ser: 0.8 mg/dL (ref 0.44–1.00)

## 2021-01-17 MED ORDER — IOHEXOL 350 MG/ML SOLN
80.0000 mL | Freq: Once | INTRAVENOUS | Status: AC | PRN
  Administered 2021-01-17: 80 mL via INTRAVENOUS

## 2021-01-25 ENCOUNTER — Telehealth: Payer: Self-pay | Admitting: *Deleted

## 2021-01-25 NOTE — Telephone Encounter (Signed)
Spoke with the patient and scheduled a new patient appt with Dr Berline Lopes on 9/30 at 9 am. Explained that the office may call her and move the appt up. Patient given the address and phone number for the clinic, along with the policy for mask/visitors

## 2021-01-31 ENCOUNTER — Other Ambulatory Visit: Payer: Self-pay

## 2021-01-31 ENCOUNTER — Inpatient Hospital Stay: Payer: 59 | Attending: Gynecologic Oncology | Admitting: Gynecologic Oncology

## 2021-01-31 ENCOUNTER — Encounter: Payer: Self-pay | Admitting: Gynecologic Oncology

## 2021-01-31 VITALS — HR 78 | Temp 98.6°F | Resp 16 | Ht 62.5 in | Wt 197.0 lb

## 2021-01-31 DIAGNOSIS — E785 Hyperlipidemia, unspecified: Secondary | ICD-10-CM | POA: Insufficient documentation

## 2021-01-31 DIAGNOSIS — F17210 Nicotine dependence, cigarettes, uncomplicated: Secondary | ICD-10-CM | POA: Diagnosis not present

## 2021-01-31 DIAGNOSIS — Z79890 Hormone replacement therapy (postmenopausal): Secondary | ICD-10-CM | POA: Diagnosis not present

## 2021-01-31 DIAGNOSIS — I10 Essential (primary) hypertension: Secondary | ICD-10-CM | POA: Diagnosis not present

## 2021-01-31 DIAGNOSIS — Z8541 Personal history of malignant neoplasm of cervix uteri: Secondary | ICD-10-CM | POA: Diagnosis not present

## 2021-01-31 DIAGNOSIS — R9389 Abnormal findings on diagnostic imaging of other specified body structures: Secondary | ICD-10-CM | POA: Insufficient documentation

## 2021-01-31 DIAGNOSIS — Z79899 Other long term (current) drug therapy: Secondary | ICD-10-CM | POA: Diagnosis not present

## 2021-01-31 DIAGNOSIS — R32 Unspecified urinary incontinence: Secondary | ICD-10-CM | POA: Diagnosis not present

## 2021-01-31 DIAGNOSIS — K219 Gastro-esophageal reflux disease without esophagitis: Secondary | ICD-10-CM | POA: Insufficient documentation

## 2021-01-31 DIAGNOSIS — K5900 Constipation, unspecified: Secondary | ICD-10-CM | POA: Insufficient documentation

## 2021-01-31 DIAGNOSIS — Z791 Long term (current) use of non-steroidal anti-inflammatories (NSAID): Secondary | ICD-10-CM | POA: Insufficient documentation

## 2021-01-31 DIAGNOSIS — K8020 Calculus of gallbladder without cholecystitis without obstruction: Secondary | ICD-10-CM | POA: Insufficient documentation

## 2021-01-31 DIAGNOSIS — J45909 Unspecified asthma, uncomplicated: Secondary | ICD-10-CM | POA: Insufficient documentation

## 2021-01-31 DIAGNOSIS — R1900 Intra-abdominal and pelvic swelling, mass and lump, unspecified site: Secondary | ICD-10-CM | POA: Insufficient documentation

## 2021-01-31 DIAGNOSIS — C539 Malignant neoplasm of cervix uteri, unspecified: Secondary | ICD-10-CM | POA: Insufficient documentation

## 2021-01-31 DIAGNOSIS — Z780 Asymptomatic menopausal state: Secondary | ICD-10-CM | POA: Insufficient documentation

## 2021-01-31 NOTE — Patient Instructions (Signed)
It was a pleasure meeting you today!  My office will work on getting your MRI images and the report from your orthopedic doctor.  This will be important to review with radiology.  I will also wait to hear from Dr. Fermin Schwab.  As we discussed today, my suspicion is that either your ovary was placed outside of the pelvis at the time of surgery or it was removed.  I do not see any mention of an ovary on the right side at the time of your surgeries with Dr. Marcello Moores.  I am also going to have my office place a referral back for you to see her.  I suspect that this fluid collection is related to your prior bowel surgery.  It may be that placement of a drain again makes most sense both from a diagnostic standpoint and a treatment standpoint (given your neurologic symptoms).  If a drain was placed, fluid from the mass along your right pelvis could be sent to pathology to assure no cancerous cells.

## 2021-01-31 NOTE — Progress Notes (Signed)
GYNECOLOGIC ONCOLOGY NEW PATIENT CONSULTATION   Patient Name: Cathy Jones  Patient Age: 60 y.o. Date of Service: 01/31/21 Referring Provider: Andree Moro, FNP  Primary Care Provider: Celene Squibb, MD Consulting Provider: Jeral Pinch, MD   Assessment/Plan:  Postmenopausal patient with remote history of cervical cancer who developed pelvic adhesions and bowel stricture likely related to RT with sigmoid resection in 2017 with collection of pelvic fluid collection post-op now with recurrence of pelvic fluid collection.   I reviewed with the patient and her husband progressive symptoms over the last several months as well as imaging findings. She was treated for a pelvic fluid collection after her bowel surgery in 2017 with recurrence in 2020 treated with percutaneous drain placement. Her last imaging showed persistent collection measuring just over 4 cm. Her current collection is in the same area and I suspect may be related. Unfortunately, her MRI was performed outside of our system and I am unable to see either of the report or images. We had the patient sign a release of records today and will work to obtain both the images and report.  In terms of her cervical cancer history, she was treated with a radical hysterectomy in the 90s. She was under the impression that her fallopian tubes and ovaries were removed at the time of her surgery. She was offered hormone replacement therapy soon after her surgery, although I wonder if this was in the setting of subsequent chemotherapy and radiation. I suspect that her ovaries may have been left at the time of her surgery but given the possibility that she would need postoperative radiation, they may have been pexed out of her pelvis. I will reach out to the [place] who performed her surgery to see what his usual and standard care would have been at that time. In reading the surgeons note who did her surgeries in 2017, there is no comment of the presence  of a right ovary along the right sidewall where she was noted to have significant adhesions.  Given remote history of her cancer, we discussed that if there was a finding of malignancy, this would be a new cancer. My suspicion for this is quite low. If she were to undergo percutaneous drainage or drain placement of this pelvic fluid collection, fluid could be sent for cytology.  I am concerned given her symptoms of numbness as well as fecal and urinary incontinence intermittently. I think percutaneous drainage could be both diagnostic and therapeutic.  I will have my office please referral for the patient to see Dr. Marcello Moores.   A copy of this note was sent to the patient's referring provider.   80 minutes of total time was spent for this patient encounter, including preparation, face-to-face counseling with the patient and coordination of care, and documentation of the encounter.   Jeral Pinch, MD  Division of Gynecologic Oncology  Department of Obstetrics and Gynecology  [facility]  ___________________________________________  Chief Complaint: Chief Complaint  Patient presents with   Pelvic mass in female    History of Present Illness:  Cathy Jones is a 60 y.o. y.o. female who is seen in consultation at the request of Andree Moro Key, FNP for an evaluation of complex pelvic fluid collection.  Patient notes about a 42-month history of progressive intermittent numbness when she stands.  She has had a history of numbness of her right leg after her femorofemoral bypass.  Earlier this year, she began having numbness of  her buttocks on the right and also feeling as if she was not sure if she had or needed to urinate or have a bowel movement.  She also endorses some low back pain.  In March, she saw her PCP Dr. [place] Crane.  She got an ultrasound and was subsequently referred to orthopedics.  An MRI of her pelvis was obtained and showed a fluid-filled mass.  Most  recently, she underwent CT scan which shows a complex fluid-filled mass along the right pelvic sidewall, increased in size from her last imaging in 2020.  She denies any recent change in her symptoms.  She has on average 2 or more episodes of urinary incontinence a week and has less frequent fecal incontinence.  Fecal incontinence seems to happen if she is moving a lot at work.  She endorses a good appetite with some weight gain.  She has intermittent nausea and episodes of emesis related to food choices, unchanged since her GI surgeries in 2017.  She struggles with constipation if she does not remember to take a probiotic daily and drink a lot of water.  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 Marcello Moores including LAR, significant lysis of adhesions, preoperative ureteral stent placement, and diverting loop ileostomy.  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  This was treated initially with right iliac artery thrombectomy, left to right femorofemoral bypass.  Unfortunately she developed a thrombus of her bypass and underwent repeat thrombectomy in April.  She was found to have a pelvic abscess and underwent open ileostomy reversal and pelvic washout in August 2017.  She then had pelvic pain and was found to have recurrence of the pelvic fluid collection in January 2020.  She was admitted and underwent drain placement with ultimate improvement in size of pelvic fluid collection.  Her last imaging in early 2020 showed pelvic fluid collection of just over 4 cm.  Patient lives in [place] with her husband.  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  She smokes approximately  half a pack per day.  PAST MEDICAL HISTORY:  Past Medical History:  Diagnosis Date   Acute renal failure (ARF) ([place]) 08/07/2015   Anemia    Asthma    pt brought her proair inhaler 06-06-14   Colon polyp    hyperplastic   Colon stricture ([facility])    Colonic obstruction ([facility])    Depression    Family history of adverse reaction to anesthesia    pts mother experiences nausea and vomiting    GERD (gastroesophageal reflux disease)    History of bronchitis    History of cervical cancer    Stage IB adenocarcinoma of the cervix, treated with rad hyst, 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   History of chemotherapy    History of hiatal hernia    History of radiation therapy    Hyperlipidemia    Hypertension    Iliac artery occlusion, 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   Migraine    "q couple years maybe" (08/07/2015)   Shortness of breath dyspnea    seasonal      PAST SURGICAL HISTORY:  Past Surgical History:  Procedure Laterality Date   COLON RESECTION  N/A 06/27/2015   Procedure: LYSIS OF ADHESIONS LOW ANTERIOR RESECTION DIVERTING LOOP ILEOSTOMY;  Surgeon: Leighton Ruff, MD;  Location: [facility];  Service: General;  Laterality: N/A;   COLON SURGERY     COLONOSCOPY     CYSTOSCOPY WITH STENT PLACEMENT Bilateral 06/27/2015   Procedure: CYSTOSCOPY WITH CATHETER  PLACEMENT;  Surgeon: Franchot Gallo, MD;  Location: [facility];  Service: Urology;  Laterality: Bilateral;   DIVERTING ILEOSTOMY N/A 06/27/2015   Procedure: DIVERTING LOOP ILEOSTOMY;  Surgeon: Leighton Ruff, MD;  Location: [facility];  Service: General;  Laterality: N/A;   FEMORAL-FEMORAL BYPASS GRAFT Bilateral 08/10/2015   Procedure: BYPASS GRAFT LEFT FEMORALTO RIGHT FEMORAL ARTERY;  Surgeon: Rosetta Posner, MD;  Location: [facility];  Service: Vascular;  Laterality: Bilateral;   FLEXIBLE SIGMOIDOSCOPY  12/12/2015   Procedure: FLEXIBLE SIGMOIDOSCOPY;  Surgeon: Leighton Ruff, MD;  [facility]: [facility];  Service: General;;   ILEOSTOMY CLOSURE N/A 12/12/2015   Procedure:  OPEN ILEOSTOMY REVERSAL AND PELVIC WASHOUT;  Surgeon: Leighton Ruff, MD;  Location: [facility];  Service: General;  Laterality: N/A;   INTRAOPERATIVE ARTERIOGRAM Right 08/10/2015   Procedure: INTRA OPERATIVE ARTERIOGRAM Right Iliac Artery;  Surgeon: Rosetta Posner, MD;  Location: [facility];  Service: Vascular;  Laterality: Right;   IR GENERIC HISTORICAL  09/12/2015   IR RADIOLOGIST EVAL & MGMT 09/12/2015 Monia Sabal, PA-C [facility] INTERV RAD   IR RADIOLOGIST EVAL & MGMT  06/23/2018   IR RADIOLOGIST EVAL & MGMT  07/07/2018   IV antibiotic infusion therapy      PERIPHERAL VASCULAR CATHETERIZATION N/A 08/09/2015   Procedure: Abdominal Aortogram w/Lower Extremity;  Surgeon: Conrad Ketchum, MD;  Location: [facility];  Service: Cardiovascular;  Laterality: N/A;   PERIPHERAL VASCULAR CATHETERIZATION Left 08/09/2015   Procedure: Peripheral Vascular Intervention;  Surgeon: Conrad [place], MD;  Location: [facility];  Service: Cardiovascular;  Laterality: Left;  common iliac   PICC LINE PLACE PERIPHERAL ([place] [facility])     RADICAL ABDOMINAL HYSTERECTOMY  1994   THROMBECTOMY FEMORAL ARTERY Bilateral 09/05/2015   Procedure: THROMBECTOMY Left to Right Femoral to  FEMORAL ARTERY Bypass,.;  Surgeon: Mal Misty, MD;  Location: [facility];  Service: Vascular;  Laterality: Bilateral;   THROMBECTOMY ILIAC ARTERY Right 08/10/2015   Procedure: THROMBECTOMY Right ILIAC ARTERY;  Surgeon: Rosetta Posner, MD;  Location: [facility];  Service: Vascular;  Laterality: Right;   TUBAL LIGATION     UPPER GASTROINTESTINAL ENDOSCOPY      OB/GYN HISTORY:  OB History  Gravida Para Term Preterm AB Living  1 1          SAB IAB Ectopic Multiple Live Births               # Outcome Date GA Lbr Len/2nd Weight Sex Delivery Anes PTL Lv  1 Para             No LMP recorded. Patient has had a hysterectomy.  Age at menarche: 41 Age at menopause: Periods stopped at the time of her hysterectomy at approximately age 82.  She does not remember  ever having menopausal symptoms other than vaginal dryness.  Her OB/GYN talk to her about hormone replacement therapy after surgery.  She was on this briefly but did not tolerate it well. Hx of HRT: See above Hx of STDs: Yes, HPV Last pap: 2020 - mildly abnormal, HPV effect History of abnormal pap smears: Remote history of adenocarcinoma of the cervix.  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   Probiotic Product (PROBIOTIC PO) Take 1 tablet by mouth daily.   rosuvastatin (CRESTOR) 5 MG tablet Take 5 mg by mouth daily.   Vitamin D, Ergocalciferol, (DRISDOL) 1.25 MG (50000 UNIT) CAPS capsule Take 50,000 Units by mouth once a week.   [DISCONTINUED] ciprofloxacin (CIPRO) 500 MG tablet Take 1 tablet (500 mg total) by mouth 2 (two) times daily.   [DISCONTINUED] metroNIDAZOLE (FLAGYL) 500 MG tablet Take 1 tablet (500 mg total) by mouth 3 (three) times daily.   [DISCONTINUED] oxyCODONE (OXY IR/ROXICODONE) 5 MG immediate release tablet Take 1 tablet (5 mg total) by mouth every 4 (four) hours as needed for moderate pain.   No facility-administered encounter medications on file as of 01/31/2021.    ALLERGIES:  Allergies  Allergen Reactions   Codeine Other (See Comments)    "hyper"     FAMILY HISTORY:  Family History  Problem Relation Age of Onset   Hypertension Mother    Hyperparathyroidism Mother    Hypertension Father    CAD Father        CABG   Peripheral vascular disease Father    Graves' disease Father    Breast cancer Maternal Aunt    Breast cancer Maternal Aunt    Prostate cancer Paternal Uncle    Rectal cancer Cousin    Colon cancer Neg Hx    Stomach cancer Neg Hx    Esophageal cancer Neg Hx    Deep vein thrombosis Neg Hx     Uterine cancer Neg Hx    Endometrial cancer Neg Hx    Pancreatic cancer Neg Hx      SOCIAL HISTORY:  Social Connections: Not on file    REVIEW OF SYSTEMS:  Pertinent positives include leg swelling, abdominal distention, constipation, urinary frequency, urinary incontinence, back pain, muscle pain/cramping, difficulty walking, swollen glands, bruising/bleeding easily. Denies appetite changes, fevers, chills, fatigue, unexplained weight changes. Denies hearing loss, neck lumps or masses, mouth sores, ringing in ears or voice changes. Denies cough or wheezing.  Denies shortness of breath. Denies chest pain or palpitations.  Denies abdominal pain, blood in stools, diarrhea, nausea, vomiting, or early satiety. Denies pain with intercourse, dysuria, hematuria. Denies hot flashes, pelvic pain, vaginal bleeding or vaginal discharge.   Denies itching, rash, or wounds. Denies dizziness, headaches, numbness or seizures. Denies anxiety, depression, confusion, or decreased concentration.  Physical Exam:  Vital Signs for this encounter:  Pulse 78, temperature 98.6 F (37 C), temperature source Oral, resp. rate 16, height 5' 2.5" (1.588 m), weight 197 lb (89.4 kg), SpO2 99 %. Body mass index is 35.46 kg/m. General: Alert, oriented, no acute distress.  HEENT: Normocephalic, atraumatic. Sclera anicteric.  Chest: Clear to auscultation bilaterally. No wheezes, rhonchi, or rales. Cardiovascular: Regular rate and rhythm, no murmurs, rubs, or gallops.  Abdomen: Obese. Normoactive bowel sounds. Soft, nondistended, nontender to palpation. No masses or hepatosplenomegaly appreciated. No palpable fluid wave.  Well-healed incisions. Extremities: Grossly normal range of motion. Warm, well perfused. No edema bilaterally.  Skin: No rashes or lesions.  Lymphatics: No cervical, supraclavicular, or inguinal adenopathy.  GU:  Normal external female genitalia. No lesions. No discharge or bleeding.  Bladder/urethra:  No lesions or masses, well supported bladder             Vagina: Significant atrophy as well as radiation changes.  No bleeding or discharge.  No masses.             Cervix: Surgically absent.             Uterus/adnexa: Surgically absent uterus, mass appreciated on rectovaginal exam within the deep right pelvis.  Rectal: No nodularity, see above. Decreased sensation peri-anal.  LABORATORY AND RADIOLOGIC DATA:  Outside medical records were reviewed to synthesize the above history, along with the history and physical obtained during the visit.   Lab Results  Component Value Date   WBC 7.3 06/10/2018   HGB 12.6 06/10/2018   HCT 41.8 06/10/2018   PLT 159 06/10/2018   GLUCOSE 101 (H) 06/10/2018   ALT 9 06/07/2018   AST 13 (L) 06/07/2018   NA 139 06/10/2018   K 3.8 06/10/2018   CL 111 06/10/2018   CREATININE 0.80 01/17/2021   BUN 8 06/10/2018   CO2 22 06/10/2018   INR 1.02 06/07/2018   MICROALBUR 26.8 (H) 07/31/2015   Ct A/P on 01/17/21: IMPRESSION: 1. Progressive 6.8 cm complex cystic mass (previously 4.3 cm) adjacent to the right pelvic sidewall. 2. Interval infrarenal aortic occlusion with collateral reconstitution of left common femoral artery, and patent fem-fem bypass graft. 3. Cholelithiasis  CT A/P 06/2018: IMPRESSION: Right trans gluteal drain terminates within a small persisting fluid collection of the right pelvis. The size is relatively unchanged over time, with no evidence of a fistula. Given that the contrast injected does not fill the entire space, some of the low-density tissue may represent scarring and not fluid.   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

## 2021-02-08 ENCOUNTER — Ambulatory Visit: Payer: 59 | Admitting: Gynecologic Oncology

## 2021-02-13 ENCOUNTER — Telehealth: Payer: Self-pay | Admitting: *Deleted

## 2021-02-13 NOTE — Telephone Encounter (Signed)
Called the patient and explained that [facility] has her referral and will try to call her next week. Patient given that office number to call herself for an appt

## 2021-03-25 ENCOUNTER — Other Ambulatory Visit (HOSPITAL_COMMUNITY): Payer: Self-pay | Admitting: General Surgery

## 2021-03-25 DIAGNOSIS — R188 Other ascites: Secondary | ICD-10-CM

## 2021-03-26 ENCOUNTER — Encounter (HOSPITAL_COMMUNITY): Payer: Self-pay | Admitting: Radiology

## 2021-03-26 NOTE — Progress Notes (Signed)
Patient Name  Cathy, Jones Legal Sex  Female DOB  05-Sep-1960 SSN  [id number] Address  [address] [address] Phone  [phone] (Home) *Preferred*  [phone] (Mobile)    RE: CT Drainage Received: Today Cathy Mariscal, MD  Cathy Jones Spoke with Cathy Jones.   She just wants a CT guided aspiration of the right pelvic fluid collection - CT AP - 01/17/21   Not sure if this represents a seroma versus a right adnexal process.  Pt is having right buttock numbness which they thing may be partially attributed to this cystic lesion   NO drain.   Please send for cytology, Gram stain & Culture and for amylase.   Cathy Jones        Previous Messages   ----- Message -----  From: Cathy Jones  Sent: 03/25/2021   5:39 PM EST  To: [facility] Procedure Requests  Subject: CT Drainage                                     Procedure:  CT Drain Placement   Reason:  Pelvic fluid collection   History:  CT in computer   Provider:  Leighton Jones   Provider Contact:  [phone]

## 2021-03-28 ENCOUNTER — Encounter (HOSPITAL_COMMUNITY): Payer: Self-pay

## 2021-03-28 ENCOUNTER — Ambulatory Visit (HOSPITAL_COMMUNITY): Payer: 59

## 2021-04-02 IMAGING — DX DG FOOT COMPLETE 3+V*R*
3 series · 3 of 3 positions shown · non-contrast
Comparison: None.
COMPARISON: None.

Addendum:
CLINICAL DATA: Right foot pain after fall today.

EXAM:
RIGHT FOOT COMPLETE - 3+ VIEW

[dg foot complete right (1 of 3)]
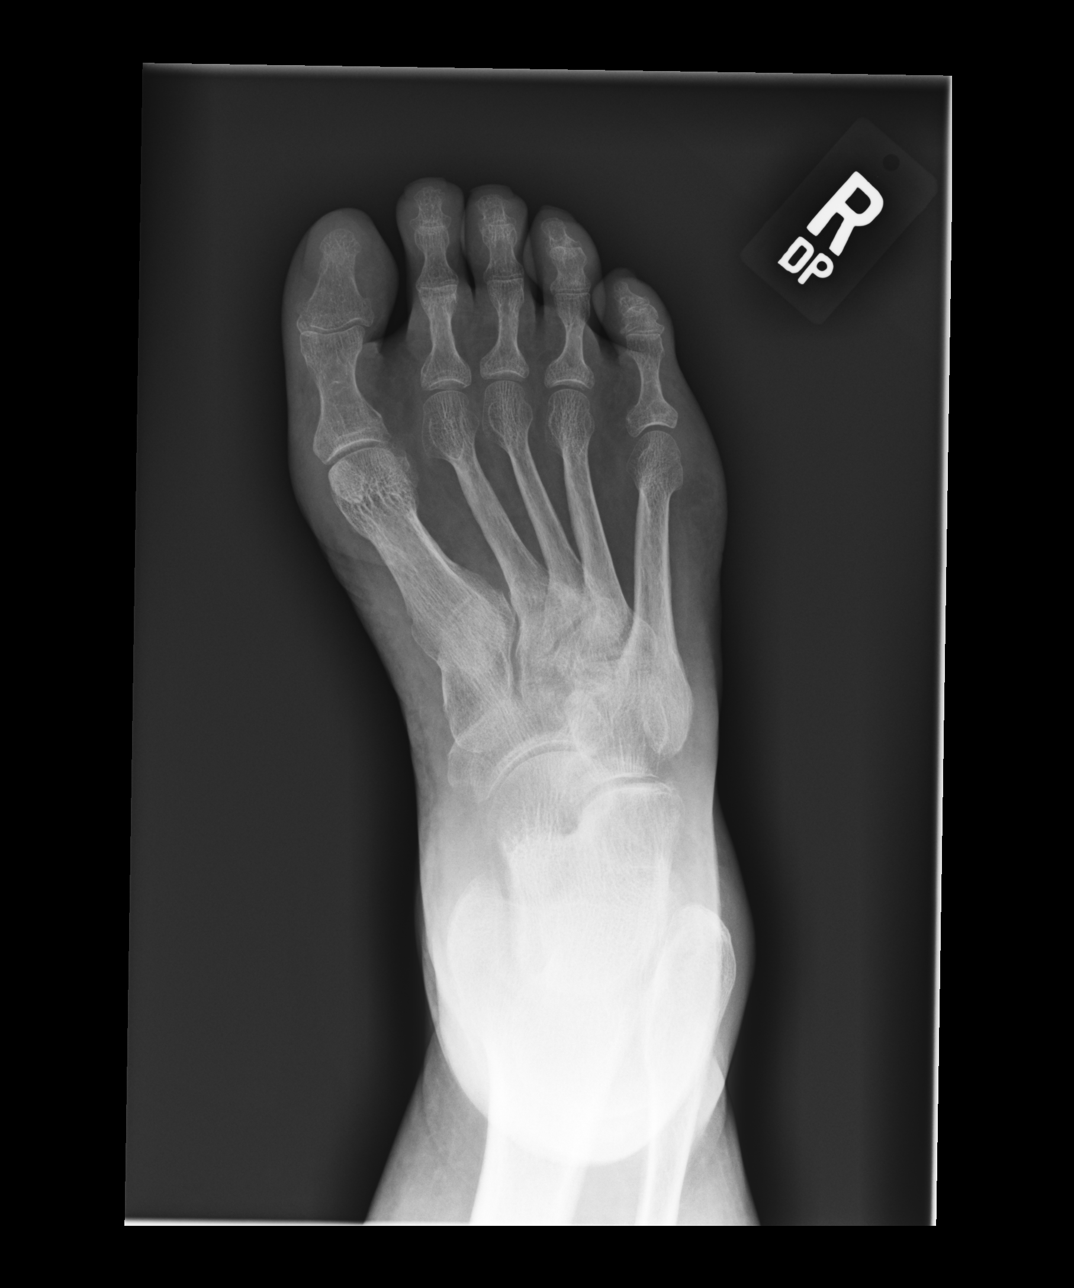

[dg foot complete right (2 of 3)]
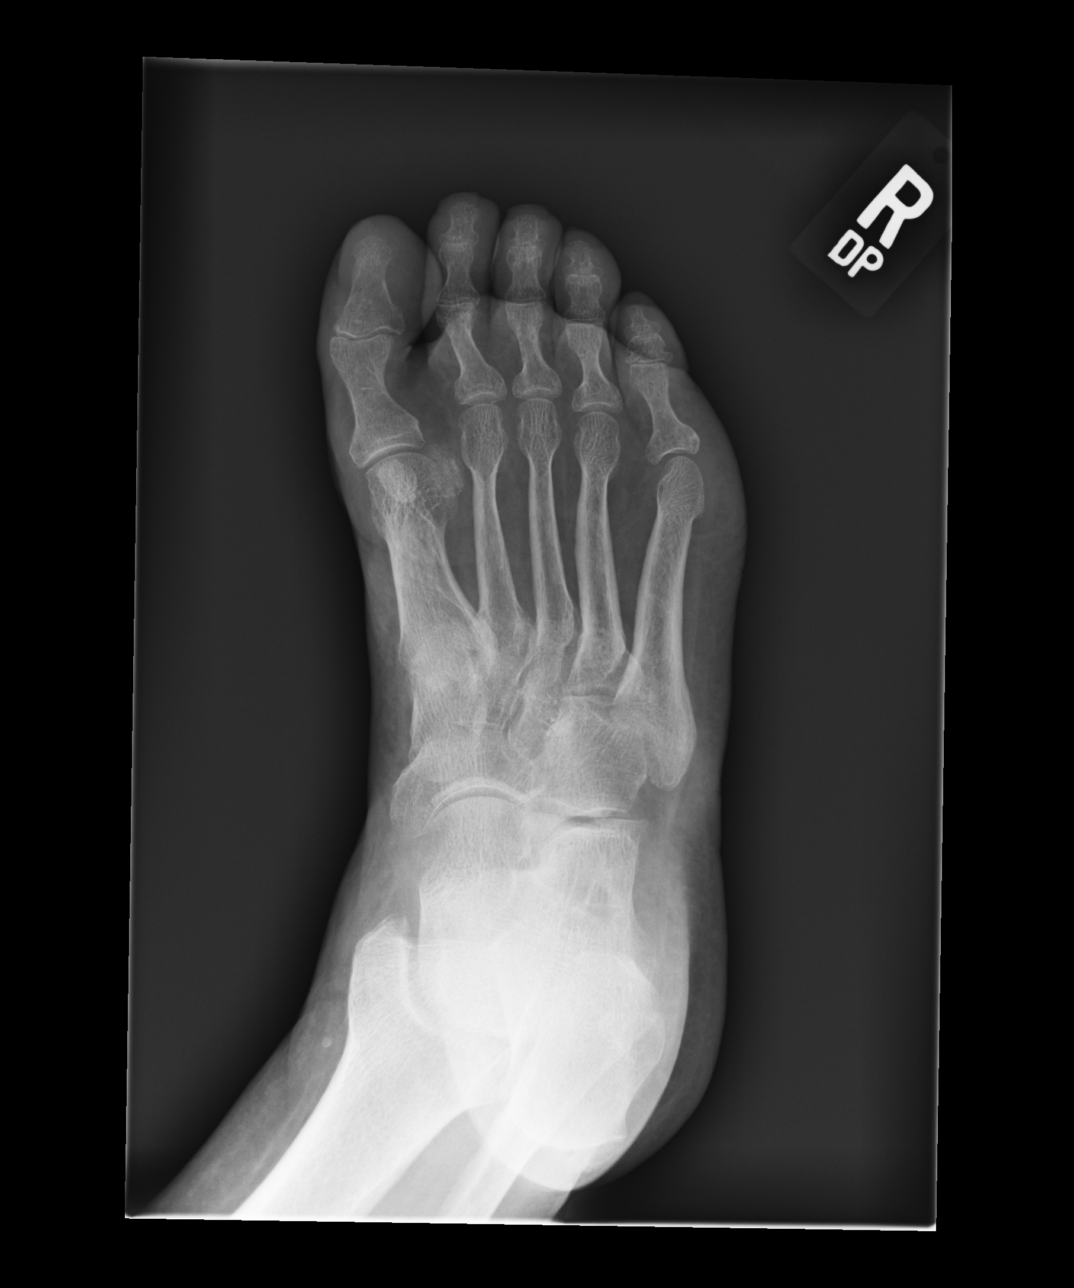

[dg foot complete right (3 of 3)]
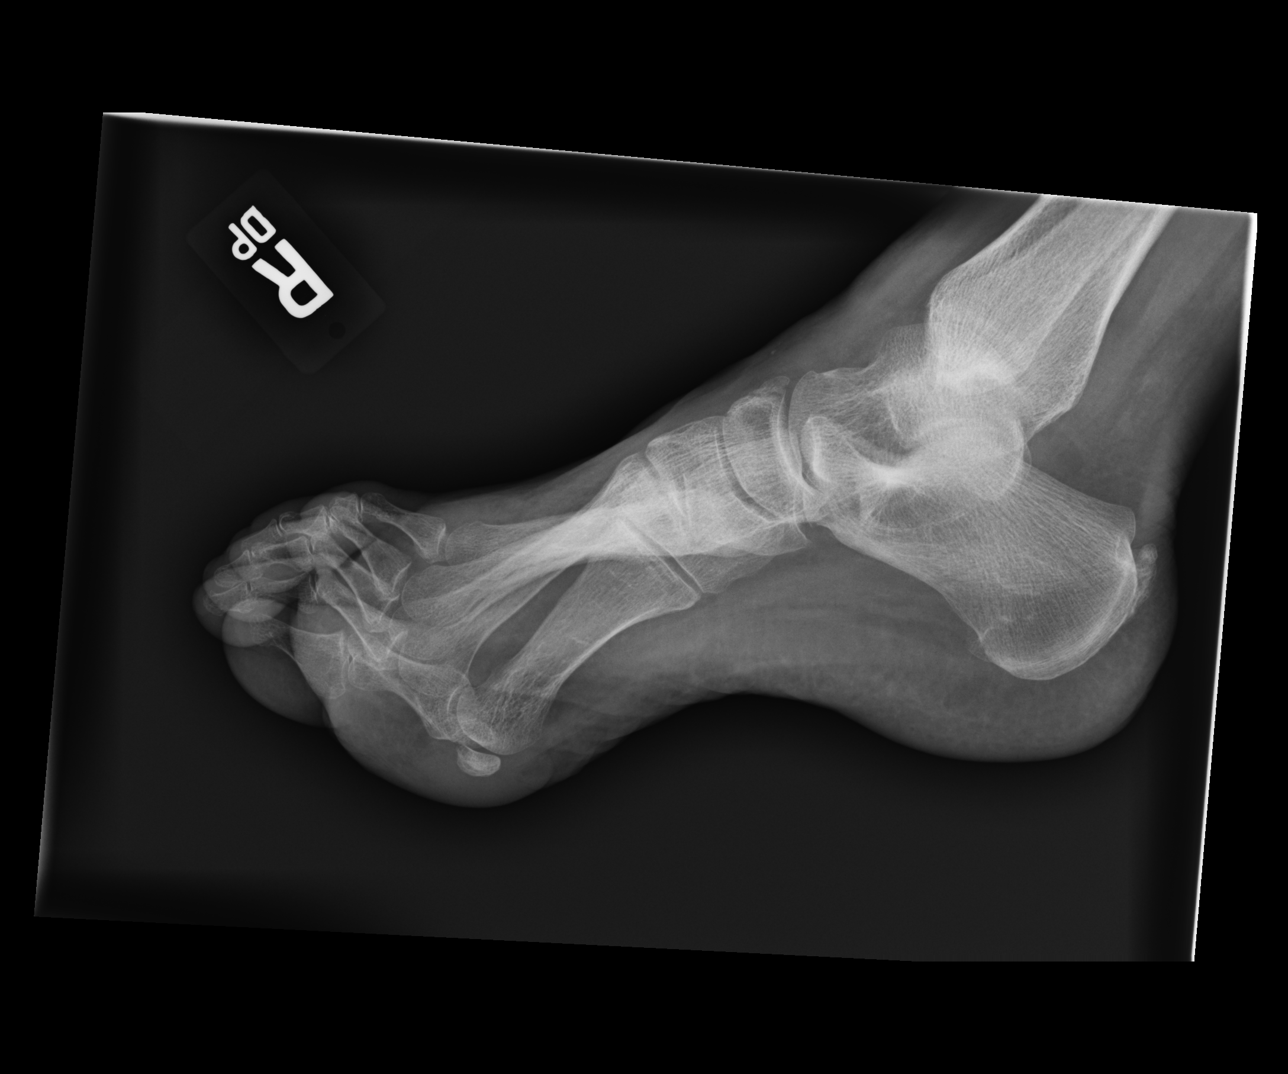

[3 of 3 positions shown; findings below may reference images not displayed]

FINDINGS: There is no evidence of fracture or dislocation. There is no
evidence of arthropathy or other focal bone abnormality. Soft
tissues are unremarkable.
IMPRESSION: Negative.

ADDENDUM:
Upon further review, there is noted cortical irregularity involving
the superior aspect of the navicular bone on the lateral projection
suggesting possible nondisplaced fracture.

*** End of Addendum ***
FINDINGS: There is no evidence of fracture or dislocation. There is no
evidence of arthropathy or other focal bone abnormality. Soft
tissues are unremarkable.
IMPRESSION: Negative.

## 2021-04-03 ENCOUNTER — Other Ambulatory Visit: Payer: Self-pay | Admitting: Radiology

## 2021-04-03 ENCOUNTER — Other Ambulatory Visit: Payer: Self-pay | Admitting: Internal Medicine

## 2021-04-04 ENCOUNTER — Other Ambulatory Visit: Payer: Self-pay | Admitting: Radiology

## 2021-04-05 ENCOUNTER — Encounter (HOSPITAL_COMMUNITY): Payer: Self-pay

## 2021-04-05 ENCOUNTER — Ambulatory Visit (HOSPITAL_COMMUNITY)
Admission: RE | Admit: 2021-04-05 | Discharge: 2021-04-05 | Disposition: A | Discharge status: Home / Self Care | Payer: 59 | Source: Ambulatory Visit | Attending: General Surgery | Admitting: General Surgery

## 2021-04-05 ENCOUNTER — Other Ambulatory Visit: Payer: Self-pay

## 2021-04-05 DIAGNOSIS — R200 Anesthesia of skin: Secondary | ICD-10-CM | POA: Insufficient documentation

## 2021-04-05 DIAGNOSIS — Z933 Colostomy status: Secondary | ICD-10-CM | POA: Diagnosis not present

## 2021-04-05 DIAGNOSIS — Z8541 Personal history of malignant neoplasm of cervix uteri: Secondary | ICD-10-CM | POA: Insufficient documentation

## 2021-04-05 DIAGNOSIS — N9489 Other specified conditions associated with female genital organs and menstrual cycle: Secondary | ICD-10-CM | POA: Diagnosis not present

## 2021-04-05 DIAGNOSIS — K651 Peritoneal abscess: Secondary | ICD-10-CM | POA: Insufficient documentation

## 2021-04-05 DIAGNOSIS — K624 Stenosis of anus and rectum: Secondary | ICD-10-CM | POA: Diagnosis not present

## 2021-04-05 DIAGNOSIS — Z9221 Personal history of antineoplastic chemotherapy: Secondary | ICD-10-CM | POA: Diagnosis not present

## 2021-04-05 DIAGNOSIS — R188 Other ascites: Secondary | ICD-10-CM | POA: Diagnosis present

## 2021-04-05 DIAGNOSIS — Z90710 Acquired absence of both cervix and uterus: Secondary | ICD-10-CM | POA: Diagnosis not present

## 2021-04-05 DIAGNOSIS — Z923 Personal history of irradiation: Secondary | ICD-10-CM | POA: Insufficient documentation

## 2021-04-05 DIAGNOSIS — K56699 Other intestinal obstruction unspecified as to partial versus complete obstruction: Secondary | ICD-10-CM | POA: Insufficient documentation

## 2021-04-05 DIAGNOSIS — M549 Dorsalgia, unspecified: Secondary | ICD-10-CM | POA: Insufficient documentation

## 2021-04-05 LAB — BODY FLUID CELL COUNT WITH DIFFERENTIAL
Eos, Fluid: 4 %
Lymphs, Fluid: 16 %
Monocyte-Macrophage-Serous Fluid: 0 % — ABNORMAL LOW (ref 50–90)
Neutrophil Count, Fluid: 80 % — ABNORMAL HIGH (ref 0–25)
Total Nucleated Cell Count, Fluid: UNDETERMINED cu mm (ref 0–1000)

## 2021-04-05 MED ORDER — MIDAZOLAM HCL 2 MG/2ML IJ SOLN
INTRAMUSCULAR | Status: AC
  Filled 2021-04-05: qty 4

## 2021-04-05 MED ORDER — SODIUM CHLORIDE 0.9 % IV SOLN: INTRAVENOUS | Status: DC

## 2021-04-05 MED ORDER — MIDAZOLAM HCL 2 MG/2ML IJ SOLN
INTRAMUSCULAR | Status: AC | PRN
  Administered 2021-04-05 (×3): .5 mg via INTRAVENOUS
  Administered 2021-04-05: 1 mg via INTRAVENOUS

## 2021-04-05 MED ORDER — FENTANYL CITRATE (PF) 100 MCG/2ML IJ SOLN
INTRAMUSCULAR | Status: AC
  Filled 2021-04-05: qty 4

## 2021-04-05 MED ORDER — FENTANYL CITRATE (PF) 100 MCG/2ML IJ SOLN
INTRAMUSCULAR | Status: AC | PRN
  Administered 2021-04-05 (×2): 25 ug via INTRAVENOUS
  Administered 2021-04-05 (×2): 50 ug via INTRAVENOUS

## 2021-04-05 MED ORDER — LIDOCAINE HCL 1 % IJ SOLN
INTRAMUSCULAR | Status: AC
  Filled 2021-04-05: qty 10

## 2021-04-05 NOTE — Sedation Documentation (Signed)
Patient is resting comfortably. Procedure started °

## 2021-04-05 NOTE — Sedation Documentation (Signed)
Vital signs stable. 

## 2021-04-05 NOTE — Sedation Documentation (Signed)
Patient is resting comfortably. 

## 2021-04-05 NOTE — Sedation Documentation (Signed)
Report called to [place].  Pt tolerated procedure very well.  Totals:  Time: 62mins Fentanyl: 158mcg  Versed: 2.5mg   Approx. 70cc fluid removed

## 2021-04-05 NOTE — H&P (Signed)
Chief Complaint: Pelvic fluid collection  Referring Physician(s): Leighton Ruff  Supervising Physician: Arne Cleveland  Patient Status: [facility] - Out-pt  History of Present Illness: Cathy Jones is a 60 y.o. female who underwent hysterectomy and lymph node dissection back in 1994 for cervical cancer.  She was also treated with pelvic radiation which caused a proximal rectal stricture.  She underwent and open low anterior resection with diverting ileostomy in February 2017.  Dr. Leighton Ruff note reads= This is a 60 year old female who presented to my office in 2017 for a colonic stricture. She had a history of a total abdominal hysterectomy with pelvic lymphadenectomy for cervical cancer in 1994. This was followed by chemotherapy and radiation. She has not had evidence of recurrence since then. Unfortunately it appears the radiation because the development of a proximal rectal stricture. This became symptomatic and she underwent a open low anterior resection with diverting ileostomy in February 2017. She was hospitalized several times after that due to uncontrolled ileostomy outputs and dehydration. During 1 of these episodes she developed an aortic clot which embolized and caused her to undergo emergent vascular surgery resulting in a femorofemoral bypass. Follow-up CT scan after this showed a small pelvic abscess. Initially a drain was placed by interventional radiology. Unfortunately during this time her femorofemoral graft went down and she underwent a emergent thrombectomy. After couple weeks the drain was removed once no more leak was noted with drain injection. We confirmed that the anastomosis was healed with a Gastrografin enema and then did an open evacuation of the pelvic fluid collection and ileostomy reversal.    Cathy Jones was seen by her PCP recently c/o right buttock numbness and back pain.  MRI of her pelvis was obtained at that time, which did not show any spinal  impingement per pt, but did show a fluid-filled mass in her pelvis.   CT scan done 01/17/21 showed  Progressive 6.8 cm complex cystic mass (previously 4.3 cm) adjacent to the right pelvic sidewall  She is here today for a CT guided aspiration of the right pelvic fluid collection seen on CT AP - 01/17/21   Dr. Marcello Moores would like the fluid to be sent for cytology, gram stain & culture, and amylase.  She is NPO.   Past Medical History:  Diagnosis Date   Acute renal failure (ARF) ([place]) 08/07/2015   Anemia    Asthma    pt brought her proair inhaler 06-06-14   Colon polyp    hyperplastic   Colon stricture (HCC)    Colonic obstruction (HCC)    Depression    Family history of adverse reaction to anesthesia    pts mother experiences nausea and vomiting    GERD (gastroesophageal reflux disease)    History of bronchitis    History of cervical cancer    Stage IB adenocarcinoma of the cervix, treated with rad hyst, 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   History of chemotherapy    History of hiatal hernia    History of radiation therapy    Hyperlipidemia    Hypertension    Iliac artery occlusion, 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   Migraine    "q couple years maybe" (08/07/2015)   Shortness of breath dyspnea    seasonal     Past Surgical History:  Procedure Laterality Date   COLON RESECTION N/A 06/27/2015   Procedure: LYSIS OF ADHESIONS LOW ANTERIOR RESECTION DIVERTING LOOP ILEOSTOMY;  Surgeon: Leighton Ruff, MD;  [facility];  Service: General;  Laterality: N/A;   COLON SURGERY     COLONOSCOPY     CYSTOSCOPY WITH STENT PLACEMENT Bilateral 06/27/2015   Procedure: CYSTOSCOPY WITH CATHETER  PLACEMENT;  Surgeon: Franchot Gallo, MD;  [facility];  Service: Urology;  Laterality: Bilateral;   DIVERTING ILEOSTOMY N/A 06/27/2015   Procedure: DIVERTING LOOP ILEOSTOMY;  Surgeon: Leighton Ruff, MD;  [facility];  Service: General;  Laterality: N/A;   FEMORAL-FEMORAL BYPASS GRAFT Bilateral 08/10/2015    Procedure: BYPASS GRAFT LEFT FEMORALTO RIGHT FEMORAL ARTERY;  Surgeon: Rosetta Posner, MD;  Location: [place];  Service: Vascular;  Laterality: Bilateral;   FLEXIBLE SIGMOIDOSCOPY  12/12/2015   Procedure: FLEXIBLE SIGMOIDOSCOPY;  Surgeon: Leighton Ruff, MD;  [facility];  Service: General;;   ILEOSTOMY CLOSURE N/A 12/12/2015   Procedure: OPEN ILEOSTOMY REVERSAL AND PELVIC WASHOUT;  Surgeon: Leighton Ruff, MD;  [facility];  Service: General;  Laterality: N/A;   INTRAOPERATIVE ARTERIOGRAM Right 08/10/2015   Procedure: INTRA OPERATIVE ARTERIOGRAM Right Iliac Artery;  Surgeon: Rosetta Posner, MD;  Location: [place];  Service: Vascular;  Laterality: Right;   IR GENERIC HISTORICAL  09/12/2015   IR RADIOLOGIST EVAL & MGMT 09/12/2015 Monia Sabal, PA-C [facility] INTERV RAD   IR RADIOLOGIST EVAL & MGMT  06/23/2018   IR RADIOLOGIST EVAL & MGMT  07/07/2018   IV antibiotic infusion therapy      PERIPHERAL VASCULAR CATHETERIZATION N/A 08/09/2015   Procedure: Abdominal Aortogram w/Lower Extremity;  Surgeon: Conrad Pickens, MD;  Location: [place] [facility];  Service: Cardiovascular;  Laterality: N/A;   PERIPHERAL VASCULAR CATHETERIZATION Left 08/09/2015   Procedure: Peripheral Vascular Intervention;  Surgeon: Conrad , MD;  Location: [place] [facility];  Service: Cardiovascular;  Laterality: Left;  common iliac   PICC LINE PLACE PERIPHERAL ([place] HX)     RADICAL ABDOMINAL HYSTERECTOMY  1994   THROMBECTOMY FEMORAL ARTERY Bilateral 09/05/2015   Procedure: THROMBECTOMY Left to Right Femoral to  FEMORAL ARTERY Bypass,.;  Surgeon: Mal Misty, MD;  Location: [place];  Service: Vascular;  Laterality: Bilateral;   THROMBECTOMY ILIAC ARTERY Right 08/10/2015   Procedure: THROMBECTOMY Right ILIAC ARTERY;  Surgeon: Rosetta Posner, MD;  Location: [facility];  Service: Vascular;  Laterality: Right;   TUBAL LIGATION     UPPER GASTROINTESTINAL ENDOSCOPY      Allergies: Codeine  Medications: Prior to Admission  medications   Medication Sig Start Date End Date Taking? Authorizing Provider  ibuprofen (ADVIL) 200 MG tablet Take 600-800 mg by mouth every 6 (six) hours as needed for moderate pain.   Yes [provider]  phenylephrine (SUDAFED PE) 10 MG TABS tablet Take 10 mg by mouth every 4 (four) hours as needed (allergies).   Yes [provider]  Probiotic Product (PROBIOTIC PO) Take 1 tablet by mouth daily.   Yes [provider]  rosuvastatin (CRESTOR) 5 MG tablet Take 5 mg by mouth daily. 01/25/21  Yes [provider]  Vitamin D, Ergocalciferol, (DRISDOL) 1.25 MG (50000 UNIT) CAPS capsule Take 50,000 Units by mouth every Saturday. 01/02/21  Yes [provider]     Family History  Problem Relation Age of Onset   Hypertension Mother    Hyperparathyroidism Mother    Hypertension Father    CAD Father        CABG   Peripheral vascular disease Father    Graves' disease Father    Breast cancer Maternal Aunt    Breast cancer Maternal  Aunt    Prostate cancer Paternal Uncle    Rectal cancer Cousin    Colon cancer Neg Hx    Stomach cancer Neg Hx    Esophageal cancer Neg Hx    Deep vein thrombosis Neg Hx    Uterine cancer Neg Hx    Endometrial cancer Neg Hx    Pancreatic cancer Neg Hx     Social History   Socioeconomic History   Marital status: Married    Spouse name: Not on file   Number of children: 1   Years of education: Not on file   Highest education level: Not on file  Occupational History   Not on file  Tobacco Use   Smoking status: Every Day    Packs/day: 0.50    Years: 45.00    Pack years: 22.50    Types: Cigarettes    Start date: 05/17/1975   Smokeless tobacco: Never   Tobacco comments:    Less than 10 cigarettes per day  Vaping Use   Vaping Use: Former   Devices: e-cigrette with out nicotene for of the ostomy  Substance and Sexual Activity   Alcohol use: No    Alcohol/week: 0.0 standard drinks   Drug use: Not Currently     Types: Marijuana    Comment: previously marijuana   Sexual activity: Not Currently  Other Topics Concern   Not on file  Social History Narrative   Not on file   Social Determinants of Health   Financial Resource Strain: Not on file  Food Insecurity: Not on file  Transportation Needs: Not on file  Physical Activity: Not on file  Stress: Not on file  Social Connections: Not on file     Review of Systems: A 12 point ROS discussed and pertinent positives are indicated in the HPI above.  All other systems are negative.  Review of Systems  Vital Signs: BP 131/78   Pulse 76   Temp 97.9 F (36.6 C) (Oral)   Resp 16   Ht 5\' 2"  (1.575 m)   Wt 86.2 kg   SpO2 97%   BMI 34.75 kg/m   Physical Exam Vitals reviewed.  Constitutional:      Appearance: Normal appearance.  HENT:     Head: Normocephalic and atraumatic.  Eyes:     Extraocular Movements: Extraocular movements intact.  Cardiovascular:     Rate and Rhythm: Normal rate and regular rhythm.  Pulmonary:     Effort: Pulmonary effort is normal. No respiratory distress.     Breath sounds: Normal breath sounds.  Abdominal:     Palpations: Abdomen is soft.  Musculoskeletal:        General: Normal range of motion.     Cervical back: Normal range of motion.  Skin:    General: Skin is warm and dry.  Neurological:     General: No focal deficit present.     Mental Status: She is alert and oriented to person, place, and time.  Psychiatric:        Mood and Affect: Mood normal.        Behavior: Behavior normal.        Thought Content: Thought content normal.        Judgment: Judgment normal.    Imaging: No results found.  Labs:  CBC: No results for input(s): WBC, HGB, HCT, PLT in the last 8760 hours.  COAGS: No results for input(s): INR, APTT in the last 8760 hours.  BMP: Recent Labs  01/17/21 1032  CREATININE 0.80    LIVER FUNCTION TESTS: No results for input(s): BILITOT, AST, ALT, ALKPHOS, PROT, ALBUMIN  in the last 8760 hours.  TUMOR MARKERS: No results for input(s): AFPTM, CEA, CA199, CHROMGRNA in the last 8760 hours.  Assessment and Plan:  Remote history of hysterectomy follow by colon surgery. Now with pelvic fluid collection.  Will proceed with image guided aspiration by Dr. Vernard Gambles. Will send for cytology, Gram stain & Culture and for amylase as requested by Dr. Marcello Moores.  Risks and benefits of aspiration were discussed with the patient including bleeding, infection, damage to adjacent structures, bowel perforation/fistula connection, and sepsis.  All of the patient's questions were answered, patient is agreeable to proceed. Consent signed and in chart.  Thank you for allowing our service to participate in Cathy Jones 's care.  Electronically Signed: Murrell Redden, PA-C   04/05/2021, 7:08 AM      I spent a total of  30 Minutes   in face to face in clinical consultation, greater than 50% of which was counseling/coordinating care for pelvic fluid aspiration.

## 2021-04-05 NOTE — Procedures (Signed)
  Procedure: CT aspiration R deep pelvic recurrent fluid collection 27ml bloody EBL:   minimal Complications:  none immediate  See full dictation in [employer].  Dillard Cannon MD Main # [phone] Pager  [phone] Mobile [phone]

## 2021-04-08 LAB — AMYLASE, BODY FLUID (OTHER)

## 2021-04-10 LAB — AEROBIC/ANAEROBIC CULTURE W GRAM STAIN (SURGICAL/DEEP WOUND)
Culture: NO GROWTH
Gram Stain: NONE SEEN

## 2021-04-10 LAB — CYTOLOGY - NON PAP

## 2021-05-02 ENCOUNTER — Encounter: Payer: Self-pay | Admitting: Internal Medicine

## 2021-05-22 DIAGNOSIS — N760 Acute vaginitis: Secondary | ICD-10-CM | POA: Diagnosis not present

## 2021-05-22 DIAGNOSIS — R350 Frequency of micturition: Secondary | ICD-10-CM | POA: Diagnosis not present

## 2021-06-11 ENCOUNTER — Ambulatory Visit: Payer: BC Managed Care – PPO | Admitting: Internal Medicine

## 2021-06-11 ENCOUNTER — Encounter: Payer: Self-pay | Admitting: Internal Medicine

## 2021-06-11 VITALS — BP 138/76 | HR 92 | Ht 62.0 in | Wt 179.2 lb

## 2021-06-11 DIAGNOSIS — R188 Other ascites: Secondary | ICD-10-CM

## 2021-06-11 DIAGNOSIS — K5900 Constipation, unspecified: Secondary | ICD-10-CM | POA: Diagnosis not present

## 2021-06-11 DIAGNOSIS — R935 Abnormal findings on diagnostic imaging of other abdominal regions, including retroperitoneum: Secondary | ICD-10-CM | POA: Diagnosis not present

## 2021-06-11 DIAGNOSIS — Z1211 Encounter for screening for malignant neoplasm of colon: Secondary | ICD-10-CM | POA: Diagnosis not present

## 2021-06-11 MED ORDER — SUTAB 1479-225-188 MG PO TABS: 1.0000 | ORAL_TABLET | Freq: Once | ORAL | 0 refills | Status: AC

## 2021-06-11 NOTE — Progress Notes (Signed)
HISTORY OF PRESENT ILLNESS:  Cathy Jones is a 61 y.o. female with hypertension, hyperlipidemia, and remote history of cervical cancer for which she underwent total abdominal hysterectomy with lymph node dissection followed by radiation therapy (approximately 30 years ago).  The patient was sent to me in January 2016 for routine screening colonoscopy.  She was found to have high-grade distal sigmoid colon stricture not permitting passage of the endoscope.  Biopsies were negative.  She was brought back 2 weeks later with more aggressive bowel preparation.  Attempts to dilate the stricture were unsuccessful.  At that time the patient did report problems with constipation, abdominal bloating, and intermittent episodes of abdominal pain with vomiting.  This was felt to represent intermittent obstruction.  Due to these issues she had significant weight loss.  She was subsequently referred to general surgery for consideration of colonic resection.  She subsequently underwent open low anterior resection with diverting ileostomy in February 2017.  Postoperatively she had issues with high ostomy output for which she was hospitalized due to dehydration.  At 1 point she developed aortic clot with embolization requiring emergent vascular surgery (femorofemoral bypass).  Subsequently had a pelvic abscess.  She subsequently had open evacuation of a pelvic fluid collection with ileostomy reversal.  Over a year ago she developed problems with progressive intermittent numbness of the right buttock, right leg, and lower back when standing.  Work-up revealed a fluid-filled mass in the pelvis.  She was evaluated by her gynecologist September 2022.  She did have some transient urinary and fecal incontinence.  She was evaluated by her general surgeon November 2022.  Percutaneous drainage of 75 cc of fluid performed.  Cytology and cultures were negative.  She states this may have helped some of her symptoms of numbness.  She was seen  in follow-up by general surgery.  She did have transient incontinence with loose stools.  This has improved.  She was then referred to this office for follow-up colonoscopy.  She has had no follow-up imaging post drainage procedure.  She and her husband are disappointed in a lack of explanation for the fluid collection or definitive plans (other than colonoscopy).  She now tells me that she has bowel movements most days.  Occasionally will not have a bowel movement.  No longer having diarrhea.  Does since difficulty with bowel movements at times.  REVIEW OF SYSTEMS:  All non-GI ROS negative unless otherwise stated in the HPI except for fatigue, ankle swelling, excessive urination, urinary leakage  Past Medical History:  Diagnosis Date   Acute renal failure (ARF) ([place]) 08/07/2015   Anemia    Asthma    pt brought her proair inhaler 06-06-14   Colon polyp    hyperplastic   Colon stricture (HCC)    Colonic obstruction (HCC)    Depression    Family history of adverse reaction to anesthesia    pts mother experiences nausea and vomiting    GERD (gastroesophageal reflux disease)    History of bronchitis    History of cervical cancer    Stage IB adenocarcinoma of the cervix, treated with rad hyst, 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   History of chemotherapy    History of hiatal hernia    History of radiation therapy    Hyperlipidemia    Hypertension    Iliac artery occlusion, 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   Migraine    "q couple years maybe" (08/07/2015)   Shortness of breath dyspnea    seasonal  Past Surgical History:  Procedure Laterality Date   COLON RESECTION N/A 06/27/2015   Procedure: LYSIS OF ADHESIONS LOW ANTERIOR RESECTION DIVERTING LOOP ILEOSTOMY;  Surgeon: Leighton Ruff, MD;  [facility];  Service: General;  Laterality: N/A;   COLON SURGERY     COLONOSCOPY     CYSTOSCOPY WITH STENT PLACEMENT Bilateral 06/27/2015   Procedure: CYSTOSCOPY WITH CATHETER  PLACEMENT;  Surgeon: Franchot Gallo, MD;  [facility];  Service: Urology;  Laterality: Bilateral;   DIVERTING ILEOSTOMY N/A 06/27/2015   Procedure: DIVERTING LOOP ILEOSTOMY;  Surgeon: Leighton Ruff, MD;  [facility];  Service: General;  Laterality: N/A;   FEMORAL-FEMORAL BYPASS GRAFT Bilateral 08/10/2015   Procedure: BYPASS GRAFT LEFT FEMORALTO RIGHT FEMORAL ARTERY;  Surgeon: Rosetta Posner, MD;  Location: [place];  Service: Vascular;  Laterality: Bilateral;   FLEXIBLE SIGMOIDOSCOPY  12/12/2015   Procedure: FLEXIBLE SIGMOIDOSCOPY;  Surgeon: Leighton Ruff, MD;  [facility];  Service: General;;   ILEOSTOMY CLOSURE N/A 12/12/2015   Procedure: OPEN ILEOSTOMY REVERSAL AND PELVIC WASHOUT;  Surgeon: Leighton Ruff, MD;  [facility];  Service: General;  Laterality: N/A;   INTRAOPERATIVE ARTERIOGRAM Right 08/10/2015   Procedure: INTRA OPERATIVE ARTERIOGRAM Right Iliac Artery;  Surgeon: Rosetta Posner, MD;  Location: [place];  Service: Vascular;  Laterality: Right;   IR GENERIC HISTORICAL  09/12/2015   IR RADIOLOGIST EVAL & MGMT 09/12/2015 Monia Sabal, PA-C [facility] INTERV RAD   IR RADIOLOGIST EVAL & MGMT  06/23/2018   IR RADIOLOGIST EVAL & MGMT  07/07/2018   IV antibiotic infusion therapy      PERIPHERAL VASCULAR CATHETERIZATION N/A 08/09/2015   Procedure: Abdominal Aortogram w/Lower Extremity;  Surgeon: Conrad [place], MD;  Location: [place] [facility];  Service: Cardiovascular;  Laterality: N/A;   PERIPHERAL VASCULAR CATHETERIZATION Left 08/09/2015   Procedure: Peripheral Vascular Intervention;  Surgeon: Conrad [place], MD;  Location: [place] [facility];  Service: Cardiovascular;  Laterality: Left;  common iliac   PICC LINE PLACE PERIPHERAL ([place] HX)     RADICAL ABDOMINAL HYSTERECTOMY  1994   THROMBECTOMY FEMORAL ARTERY Bilateral 09/05/2015   Procedure: THROMBECTOMY Left to Right Femoral to  FEMORAL ARTERY Bypass,.;  Surgeon: Mal Misty, MD;  Location: [place];  Service: Vascular;  Laterality: Bilateral;    THROMBECTOMY ILIAC ARTERY Right 08/10/2015   Procedure: THROMBECTOMY Right ILIAC ARTERY;  Surgeon: Rosetta Posner, MD;  Location: [place];  Service: Vascular;  Laterality: Right;   TUBAL LIGATION     UPPER GASTROINTESTINAL ENDOSCOPY      Social History Cathy Jones  reports that she has been smoking cigarettes. She started smoking about 46 years ago. She has a 22.50 pack-year smoking history. She has never used smokeless tobacco. She reports that she does not currently use drugs after having used the following drugs: Marijuana. She reports that she does not drink alcohol.  family history includes Breast cancer in her maternal aunt and maternal aunt; CAD in her father; Berenice Primas' disease in her father; Hyperparathyroidism in her mother; Hypertension in her father and mother; Peripheral vascular disease in her father; Prostate cancer in her paternal uncle; Rectal cancer in her cousin.  Allergies  Allergen Reactions   Codeine Other (See Comments)    "hyper"       PHYSICAL EXAMINATION: Vital signs: BP 138/76    Pulse 92    Ht '5\' 2"'  (1.575 m)    Wt 179 lb 4 oz (81.3 kg)  SpO2 97%    BMI 32.79 kg/m   Constitutional: Pleasant, generally well-appearing, no acute distress.  Smells of tobacco smoke Psychiatric: alert and oriented x3, cooperative Eyes: extraocular movements intact, anicteric, conjunctiva pink Mouth: oral pharynx moist, no lesions Neck: supple no lymphadenopathy Cardiovascular: heart regular rate and rhythm, no murmur Lungs: clear to auscultation bilaterally Abdomen: soft, nontender, nondistended, no obvious ascites, no peritoneal signs, normal bowel sounds, no organomegaly.  Multiple surgical incisions well-healed Rectal: Deferred until colonoscopy Extremities: no clubbing or cyanosis.  1+ lower extremity edema bilaterally Skin: no lesions on visible extremities Neuro: No focal deficits.  Cranial nerves intact  ASSESSMENT:  1.  Remote history of cervical cancer status post  total abdominal hysterectomy with lymph node dissection followed by radiation therapy. 2.  Symptomatic sigmoid colon stricture status postresection 2017 (open low anterior resection with diverting ileostomy with subsequent reversal). 3.  Recurrent pelvic fluid collection.  Status post recent drainage of 75 cc without evidence for infection or cancer by fluid studies.  Etiology unclear. 4.  Incomplete colonoscopy 2016 as described 5.  Sensation of incomplete evacuation of the bowel at times   PLAN:  1.  Recommend 1 dose of MiraLAX daily.  Adjust as needed 2.  Schedule colonoscopy for colorectal neoplasia screening.The nature of the procedure, as well as the risks, benefits, and alternatives were carefully and thoroughly reviewed with the patient. Ample time for discussion and questions allowed. The patient understood, was satisfied, and agreed to proceed.  3.  May need follow-up imaging regarding fluid collection 4.  Ongoing gynecologic and general surgical care as needed Total time of 60 minutes was spent preparing to see the patient, reviewing a myriad of tests and x-rays, reviewing a myriad of outside consultative reports, obtaining comprehensive history, performing medically appropriate physical examination, counseling the patient and her husband regarding her above listed issues, ordering endoscopic procedure (colonoscopy) and documenting clinical information in the health record

## 2021-06-11 NOTE — Patient Instructions (Addendum)
If you are age 60 or younger, your body mass index should be between 19-25. Your Body mass index is 32.79 kg/m. If this is out of the aformentioned range listed, please consider follow up with your Primary Care Provider.  ______________________________________________________  The [facility] providers would like to encourage you to use [facility] to communicate with providers for non-urgent requests or questions.  Due to long hold times on the telephone, sending your provider a message by [facility] may be a faster and more efficient way to get a response.  Please allow 48 business hours for a response.  Please remember that this is for non-urgent requests.  _______________________________________________________  Dennis Bast have been scheduled for a colonoscopy. Please follow written instructions given to you at your visit today.  Please pick up your prep supplies at the pharmacy within the next 1-3 days. If you use inhalers (even only as needed), please bring them with you on the day of your procedure.  Due to recent changes in healthcare laws, you may see the results of your imaging and laboratory studies on MyChart before your provider has had a chance to review them.  We understand that in some cases there may be results that are confusing or concerning to you. Not all laboratory results come back in the same time frame and the provider may be waiting for multiple results in order to interpret others.  Please give [place] 48 hours in order for your provider to thoroughly review all the results before contacting the office for clarification of your results.   Thank you for entrusting me with your care and choosing [facility].  Take Miralax daily

## 2021-06-14 DIAGNOSIS — R1900 Intra-abdominal and pelvic swelling, mass and lump, unspecified site: Secondary | ICD-10-CM | POA: Diagnosis not present

## 2021-06-21 DIAGNOSIS — E875 Hyperkalemia: Secondary | ICD-10-CM | POA: Diagnosis not present

## 2021-06-21 DIAGNOSIS — I739 Peripheral vascular disease, unspecified: Secondary | ICD-10-CM | POA: Diagnosis not present

## 2021-06-21 DIAGNOSIS — D72829 Elevated white blood cell count, unspecified: Secondary | ICD-10-CM | POA: Diagnosis not present

## 2021-06-21 DIAGNOSIS — R296 Repeated falls: Secondary | ICD-10-CM | POA: Diagnosis not present

## 2021-07-24 ENCOUNTER — Ambulatory Visit: Payer: BC Managed Care – PPO | Admitting: Vascular Surgery

## 2021-07-24 ENCOUNTER — Other Ambulatory Visit: Payer: Self-pay

## 2021-07-24 ENCOUNTER — Ambulatory Visit (INDEPENDENT_AMBULATORY_CARE_PROVIDER_SITE_OTHER): Payer: BC Managed Care – PPO

## 2021-07-24 ENCOUNTER — Other Ambulatory Visit (HOSPITAL_COMMUNITY): Payer: Self-pay | Admitting: Vascular Surgery

## 2021-07-24 ENCOUNTER — Encounter: Payer: Self-pay | Admitting: Vascular Surgery

## 2021-07-24 VITALS — BP 179/82 | HR 87 | Ht 62.0 in | Wt 186.4 lb

## 2021-07-24 DIAGNOSIS — I739 Peripheral vascular disease, unspecified: Secondary | ICD-10-CM | POA: Diagnosis not present

## 2021-07-24 DIAGNOSIS — I70221 Atherosclerosis of native arteries of extremities with rest pain, right leg: Secondary | ICD-10-CM | POA: Diagnosis not present

## 2021-07-24 NOTE — Progress Notes (Signed)
? ? ?[facility] [place] ? ?Patient name: JAMIRAH ZELAYA MRN: [id number] DOB: November 19, 1960 Sex: female ? ?REASON FOR CONSULT: Evaluation critical ischemia right foot ? ?HPI: ?JODELL WEITMAN is a 61 y.o. female, who is here today for evaluation.  She has a very complicated past history.  She initially was seen for severe ischemia and claudication in her right foot with Dr.Chen in 2017.  She underwent angioplasty and stenting of her left common iliac artery and planning for left to right femorofemoral bypass.  She subsequently underwent a left to right femorofemoral bypass by myself on 08/10/2015.  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  She had a pelvic abscess and is having drainage of this with a prolonged seizure and placed prone position for drainage.  She had acute thrombosis of her femorofemoral bypass at this time and underwent thrombectomy of her femorofemoral bypass on 09/05/2015.  She has done well since that time.  She has had recent recurrent difficulty regarding colonic stricture and also reaccumulation of fluid collections in her abdominal wall requiring aspiration.  Over the past several months she has had recurrent rest pain in her right foot.  She is here today for evaluation of this.  Fortunately she has no tissue loss ? ?Past Medical History:  ?Diagnosis Date  ? Acute renal failure (ARF) ([place]) 08/07/2015  ? Anemia   ? Asthma   ? pt brought her proair inhaler 06-06-14  ? Colon polyp   ? hyperplastic  ? Colon stricture ([place])   ? Colonic obstruction ([place])   ? Depression   ? Family history of adverse reaction to anesthesia   ? pts mother experiences nausea and vomiting   ? GERD (gastroesophageal reflux disease)   ? History of bronchitis   ? History of cervical cancer   ? 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 History of  chemotherapy   ? History of hiatal hernia   ? History of radiation therapy   ? Hyperlipidemia   ? Hypertension   ? 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 Migraine   ? "q couple years maybe" (08/07/2015)  ? Shortness of breath dyspnea   ? seasonal   ? ? ?Family History  ?Problem Relation Age of Onset  ? Hypertension Mother   ? Hyperparathyroidism Mother   ? Hypertension Father   ? CAD Father   ?     CABG  ? Peripheral vascular disease Father   ? Graves' disease Father   ? Breast cancer Maternal Aunt   ? Breast cancer Maternal Aunt   ? Prostate cancer Paternal Uncle   ? Rectal cancer Cousin   ? Colon cancer Neg Hx   ? Stomach cancer Neg Hx   ? Esophageal cancer Neg Hx   ? Deep vein thrombosis Neg Hx   ? Uterine cancer Neg Hx   ? Endometrial cancer Neg Hx   ? Pancreatic cancer Neg Hx   ? ? ?SOCIAL HISTORY: ?Social History  ? ?Socioeconomic History  ? Marital status: Married  ?  Spouse name: Not on file  ? Number of children: 1  ? Years of education: Not on file  ? Highest education level: Not on file  ?Occupational History  ? Not on file  ?Tobacco Use  ? Smoking status: Every Day  ?  Packs/day: 0.50  ?  Years: 45.00  ?  Pack years: 22.50  ?  Types: Cigarettes  ?  Start date: 05/17/1975  ? Smokeless tobacco: Never  ? Tobacco comments:  ?  Less than 10 cigarettes per day  ?Vaping Use  ? Vaping Use: Former  ? Devices: e-cigrette with out nicotene for of the ostomy  ?Substance and Sexual Activity  ? Alcohol use: No  ?  Alcohol/week: 0.0 standard drinks  ? Drug use: Not Currently  ?  Types: Marijuana  ?  Comment: previously marijuana  ? Sexual activity: Not Currently  ?Other Topics Concern  ? Not on file  ?Social History Narrative  ? Not on file  ? ?Social Determinants of Health  ? ?Financial Resource Strain: Not on file  ?Food Insecurity: Not on file  ?Transportation Needs: Not on file  ?Physical Activity: Not on file  ?Stress: Not on file  ?Social Connections: Not on file  ?Intimate Partner Violence: Not on file   ? ? ?Allergies  ?Allergen Reactions  ? Codeine Other (See Comments)  ?  "hyper"  ? ? ?Current Outpatient Medications  ?Medication Sig Dispense Refill  ? ibuprofen (ADVIL) 200 MG tablet Take 600-800 mg by mouth every 6 (six) hours as needed for moderate pain.    ? phenylephrine (SUDAFED PE) 10 MG TABS tablet Take 10 mg by mouth every 4 (four) hours as needed (allergies).    ? Probiotic Product (PROBIOTIC PO) Take 1 tablet by mouth daily.    ? rosuvastatin (CRESTOR) 5 MG tablet Take 5 mg by mouth daily.    ? Vitamin D, Ergocalciferol, (DRISDOL) 1.25 MG (50000 UNIT) CAPS capsule Take 50,000 Units by mouth every Saturday.    ? ?No current facility-administered medications for this visit.  ? ? ?REVIEW OF SYSTEMS:  ?'[X]'$  denotes positive finding, '[ ]'$  denotes negative finding ?Cardiac  Comments:  ?Chest pain or chest pressure:    ?Shortness of breath upon exertion:    ?Short of breath when lying flat:    ?Irregular heart rhythm:    ?    ?Vascular    ?Pain in calf, thigh, or hip brought on by ambulation: x   ?Pain in feet at night that wakes you up from your sleep:  x   ?Blood clot in your veins:    ?Leg swelling:     ?    ?Pulmonary    ?Oxygen at home:    ?Productive cough:     ?Wheezing:     ?    ?Neurologic    ?Sudden weakness in arms or legs:     ?Sudden numbness in arms or legs:     ?Sudden onset of difficulty speaking or slurred speech:    ?Temporary loss of vision in one eye:     ?Problems with dizziness:     ?    ?Gastrointestinal    ?Blood in stool:     ?Vomited blood:     ?    ?Genitourinary    ?Burning when urinating:     ?Blood in urine:    ?    ?Psychiatric    ?Major depression:     ?    ?Hematologic    ?Bleeding problems:    ?Problems with blood clotting too easily:    ?    ?Skin    ?Rashes or ulcers:    ?    ?Constitutional    ?Fever or chills:    ? ? ?PHYSICAL EXAM: ?Vitals:  ? 07/24/21 1432  ?BP: (!) 179/82  ?Pulse: 87  ?  Weight: 186 lb 6.4 oz (84.6 kg)  ?Height: '5\' 2"'$  (1.575 m)  ? ? ?GENERAL: The patient is  a well-nourished female, in no acute distress. The vital signs are documented above. ?CARDIOVASCULAR: I do not palpate femoral pulses bilaterally.  He does not have a palpable femorofemoral pulse.  She does have palpable radial pulses ?PULMONARY: There is good air exchange  ?MUSCULOSKELETAL: There are no major deformities or cyanosis. ?NEUROLOGIC: No focal weakness or paresthesias are detected. ?SKIN: There are no ulcers or rashes noted. ?PSYCHIATRIC: The patient has a normal affect. ? ?DATA:  ?Duplex today shows occlusion of her femorofemoral bypass. ? ?Ankle arm index is 0.25 on the right and 0.42 on the left ? ?CT scan regarding her abdominal issues from September 2022 revealed a patent femorofemoral bypass at that time.  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 ?MEDICAL ISSUES: ?Critical limb ischemia right foot.  I recommended urgent arteriogram within the next several weeks.  Explained that this would be either through her left groin or even potentially a brachial approach.  She clearly is not a candidate for aortobifemoral bypass grafting with a "hostile abdomen".  I did explain the possibility of accepting failed bypass if she does not have adequate left iliac inflow for redo femorofemoral bypass.  This will proceed with arteriography and possible iliac angioplasty on the left at [facility].  She understands that one of my partners will be doing the procedure and subsequent surgery is indicated ? ? ?Rosetta Posner, MD FACS ?[facility] [place] ?Office Tel [phone] ?Pager [phone] ? ?Note: Portions of this report may have been transcribed using voice recognition software.  Every effort has been made to ensure accuracy; however, inadvertent computerized transcription errors may still be present. ? ?

## 2021-07-24 NOTE — H&P (View-Only) (Signed)
? ? ?[facility] [place] ? ?Patient name: Cathy Jones MRN: [id number] DOB: [birth year] Sex: female ? ?REASON FOR CONSULT: Evaluation critical ischemia right foot ? ?HPI: ?Cathy Jones is a 61 y.o. female, who is here today for evaluation.  She has a very complicated past history.  She initially was seen for severe ischemia and claudication in her right foot with Dr.Chen in 2017.  She underwent angioplasty and stenting of her left common iliac artery and planning for left to right femorofemoral bypass.  She subsequently underwent a left to right femorofemoral bypass by myself on 08/10/2015.  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  She had a pelvic abscess and is having drainage of this with a prolonged seizure and placed prone position for drainage.  She had acute thrombosis of her femorofemoral bypass at this time and underwent thrombectomy of her femorofemoral bypass on 09/05/2015.  She has done well since that time.  She has had recent recurrent difficulty regarding colonic stricture and also reaccumulation of fluid collections in her abdominal wall requiring aspiration.  Over the past several months she has had recurrent rest pain in her right foot.  She is here today for evaluation of this.  Fortunately she has no tissue loss ? ?Past Medical History:  ?Diagnosis Date  ? Acute renal failure (ARF) ([place]) 08/07/2015  ? Anemia   ? Asthma   ? pt brought her proair inhaler 06-06-14  ? Colon polyp   ? hyperplastic  ? Colon stricture ([place])   ? Colonic obstruction ([place])   ? Depression   ? Family history of adverse reaction to anesthesia   ? pts mother experiences nausea and vomiting   ? GERD (gastroesophageal reflux disease)   ? History of bronchitis   ? History of cervical cancer   ? 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 History of  chemotherapy   ? History of hiatal hernia   ? History of radiation therapy   ? Hyperlipidemia   ? Hypertension   ? 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 Migraine   ? "q couple years maybe" (08/07/2015)  ? Shortness of breath dyspnea   ? seasonal   ? ? ?Family History  ?Problem Relation Age of Onset  ? Hypertension Mother   ? Hyperparathyroidism Mother   ? Hypertension Father   ? CAD Father   ?     CABG  ? Peripheral vascular disease Father   ? Graves' disease Father   ? Breast cancer Maternal Aunt   ? Breast cancer Maternal Aunt   ? Prostate cancer Paternal Uncle   ? Rectal cancer Cousin   ? Colon cancer Neg Hx   ? Stomach cancer Neg Hx   ? Esophageal cancer Neg Hx   ? Deep vein thrombosis Neg Hx   ? Uterine cancer Neg Hx   ? Endometrial cancer Neg Hx   ? Pancreatic cancer Neg Hx   ? ? ?SOCIAL HISTORY: ?Social History  ? ?Socioeconomic History  ? Marital status: Married  ?  Spouse name: Not on file  ? Number of children: 1  ? Years of education: Not on file  ? Highest education level: Not on file  ?Occupational History  ? Not on file  ?Tobacco Use  ? Smoking status: Every Day  ?  Packs/day: 0.50  ?  Years: 45.00  ?  Pack years: 22.50  ?  Types: Cigarettes  ?  Start date: 05/17/1975  ? Smokeless tobacco: Never  ? Tobacco comments:  ?  Less than 10 cigarettes per day  ?Vaping Use  ? Vaping Use: Former  ? Devices: e-cigrette with out nicotene for of the ostomy  ?Substance and Sexual Activity  ? Alcohol use: No  ?  Alcohol/week: 0.0 standard drinks  ? Drug use: Not Currently  ?  Types: Marijuana  ?  Comment: previously marijuana  ? Sexual activity: Not Currently  ?Other Topics Concern  ? Not on file  ?Social History Narrative  ? Not on file  ? ?Social Determinants of Health  ? ?Financial Resource Strain: Not on file  ?Food Insecurity: Not on file  ?Transportation Needs: Not on file  ?Physical Activity: Not on file  ?Stress: Not on file  ?Social Connections: Not on file  ?Intimate Partner Violence: Not on file   ? ? ?Allergies  ?Allergen Reactions  ? Codeine Other (See Comments)  ?  "hyper"  ? ? ?Current Outpatient Medications  ?Medication Sig Dispense Refill  ? ibuprofen (ADVIL) 200 MG tablet Take 600-800 mg by mouth every 6 (six) hours as needed for moderate pain.    ? phenylephrine (SUDAFED PE) 10 MG TABS tablet Take 10 mg by mouth every 4 (four) hours as needed (allergies).    ? Probiotic Product (PROBIOTIC PO) Take 1 tablet by mouth daily.    ? rosuvastatin (CRESTOR) 5 MG tablet Take 5 mg by mouth daily.    ? Vitamin D, Ergocalciferol, (DRISDOL) 1.25 MG (50000 UNIT) CAPS capsule Take 50,000 Units by mouth every Saturday.    ? ?No current facility-administered medications for this visit.  ? ? ?REVIEW OF SYSTEMS:  ?'[X]'$  denotes positive finding, '[ ]'$  denotes negative finding ?Cardiac  Comments:  ?Chest pain or chest pressure:    ?Shortness of breath upon exertion:    ?Short of breath when lying flat:    ?Irregular heart rhythm:    ?    ?Vascular    ?Pain in calf, thigh, or hip brought on by ambulation: x   ?Pain in feet at night that wakes you up from your sleep:  x   ?Blood clot in your veins:    ?Leg swelling:     ?    ?Pulmonary    ?Oxygen at home:    ?Productive cough:     ?Wheezing:     ?    ?Neurologic    ?Sudden weakness in arms or legs:     ?Sudden numbness in arms or legs:     ?Sudden onset of difficulty speaking or slurred speech:    ?Temporary loss of vision in one eye:     ?Problems with dizziness:     ?    ?Gastrointestinal    ?Blood in stool:     ?Vomited blood:     ?    ?Genitourinary    ?Burning when urinating:     ?Blood in urine:    ?    ?Psychiatric    ?Major depression:     ?    ?Hematologic    ?Bleeding problems:    ?Problems with blood clotting too easily:    ?    ?Skin    ?Rashes or ulcers:    ?    ?Constitutional    ?Fever or chills:    ? ? ?PHYSICAL EXAM: ?Vitals:  ? 07/24/21 1432  ?BP: (!) 179/82  ?Pulse: 87  ?  Weight: 186 lb 6.4 oz (84.6 kg)  ?Height: '5\' 2"'$  (1.575 m)  ? ? ?GENERAL: The patient is  a well-nourished female, in no acute distress. The vital signs are documented above. ?CARDIOVASCULAR: I do not palpate femoral pulses bilaterally.  He does not have a palpable femorofemoral pulse.  She does have palpable radial pulses ?PULMONARY: There is good air exchange  ?MUSCULOSKELETAL: There are no major deformities or cyanosis. ?NEUROLOGIC: No focal weakness or paresthesias are detected. ?SKIN: There are no ulcers or rashes noted. ?PSYCHIATRIC: The patient has a normal affect. ? ?DATA:  ?Duplex today shows occlusion of her femorofemoral bypass. ? ?Ankle arm index is 0.25 on the right and 0.42 on the left ? ?CT scan regarding her abdominal issues from September 2022 revealed a patent femorofemoral bypass at that time.  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 ?MEDICAL ISSUES: ?Critical limb ischemia right foot.  I recommended urgent arteriogram within the next several weeks.  Explained that this would be either through her left groin or even potentially a brachial approach.  She clearly is not a candidate for aortobifemoral bypass grafting with a "hostile abdomen".  I did explain the possibility of accepting failed bypass if she does not have adequate left iliac inflow for redo femorofemoral bypass.  This will proceed with arteriography and possible iliac angi[facility] on the left [facility].  She understands that one of my partners will be doing the procedure and subsequent surgery is indicated ? ? ?Rosetta Posner, MD FACS ?[facility] [place] ?Office Tel [phone] ?Pager [phone] ? ?Note: Portions of this report may have been transcribed using voice recognition software.  Every effort has been made to ensure accuracy; however, inadvertent computerized transcription errors may still be present. ? ?

## 2021-07-25 ENCOUNTER — Other Ambulatory Visit: Payer: Self-pay

## 2021-07-26 ENCOUNTER — Ambulatory Visit (HOSPITAL_COMMUNITY)
Admission: RE | Disposition: A | Discharge status: Home / Self Care | Payer: Self-pay | Source: Home / Self Care | Attending: Vascular Surgery

## 2021-07-26 ENCOUNTER — Ambulatory Visit (HOSPITAL_COMMUNITY)
Admission: RE | Admit: 2021-07-26 | Discharge: 2021-07-26 | Disposition: A | Discharge status: Home / Self Care | Payer: BC Managed Care – PPO | Attending: Vascular Surgery | Admitting: Vascular Surgery

## 2021-07-26 ENCOUNTER — Other Ambulatory Visit: Payer: Self-pay

## 2021-07-26 DIAGNOSIS — E785 Hyperlipidemia, unspecified: Secondary | ICD-10-CM | POA: Diagnosis not present

## 2021-07-26 DIAGNOSIS — F17210 Nicotine dependence, cigarettes, uncomplicated: Secondary | ICD-10-CM | POA: Diagnosis not present

## 2021-07-26 DIAGNOSIS — I70223 Atherosclerosis of native arteries of extremities with rest pain, bilateral legs: Secondary | ICD-10-CM | POA: Diagnosis not present

## 2021-07-26 DIAGNOSIS — Z79899 Other long term (current) drug therapy: Secondary | ICD-10-CM | POA: Insufficient documentation

## 2021-07-26 DIAGNOSIS — I10 Essential (primary) hypertension: Secondary | ICD-10-CM | POA: Insufficient documentation

## 2021-07-26 HISTORY — PX: ABDOMINAL AORTOGRAM W/LOWER EXTREMITY: CATH118223

## 2021-07-26 LAB — POCT I-STAT, CHEM 8
BUN: 13 mg/dL (ref 6–20)
Calcium, Ion: 1.27 mmol/L (ref 1.15–1.40)
Chloride: 103 mmol/L (ref 98–111)
Creatinine, Ser: 0.7 mg/dL (ref 0.44–1.00)
Glucose, Bld: 100 mg/dL — ABNORMAL HIGH (ref 70–99)
HCT: 48 % — ABNORMAL HIGH (ref 36.0–46.0)
Hemoglobin: 16.3 g/dL — ABNORMAL HIGH (ref 12.0–15.0)
Potassium: 4.4 mmol/L (ref 3.5–5.1)
Sodium: 141 mmol/L (ref 135–145)
TCO2: 27 mmol/L (ref 22–32)

## 2021-07-26 SURGERY — ABDOMINAL AORTOGRAM W/LOWER EXTREMITY
Anesthesia: LOCAL | Laterality: Bilateral

## 2021-07-26 MED ORDER — NITROGLYCERIN 1 MG/10 ML FOR IR/CATH LAB
INTRA_ARTERIAL | Status: AC
  Filled 2021-07-26: qty 10

## 2021-07-26 MED ORDER — SODIUM CHLORIDE 0.9% FLUSH: 3.0000 mL | Freq: Two times a day (BID) | INTRAVENOUS | Status: DC

## 2021-07-26 MED ORDER — LIDOCAINE HCL (PF) 1 % IJ SOLN
INTRAMUSCULAR | Status: AC
  Filled 2021-07-26: qty 30

## 2021-07-26 MED ORDER — ONDANSETRON HCL 4 MG/2ML IJ SOLN: 4.0000 mg | Freq: Four times a day (QID) | INTRAMUSCULAR | Status: DC | PRN

## 2021-07-26 MED ORDER — HYDRALAZINE HCL 20 MG/ML IJ SOLN: 5.0000 mg | INTRAMUSCULAR | Status: DC | PRN

## 2021-07-26 MED ORDER — NITROGLYCERIN 1 MG/10 ML FOR IR/CATH LAB
INTRA_ARTERIAL | Status: DC | PRN
  Administered 2021-07-26: 2 mL

## 2021-07-26 MED ORDER — SODIUM CHLORIDE 0.9 % IV SOLN: 250.0000 mL | INTRAVENOUS | Status: DC | PRN

## 2021-07-26 MED ORDER — HEPARIN SODIUM (PORCINE) 1000 UNIT/ML IJ SOLN
INTRAMUSCULAR | Status: DC | PRN
  Administered 2021-07-26: 8000 [IU] via INTRAVENOUS
  Administered 2021-07-26: 2000 [IU] via INTRAVENOUS

## 2021-07-26 MED ORDER — HEPARIN SODIUM (PORCINE) 1000 UNIT/ML IJ SOLN
INTRAMUSCULAR | Status: AC
  Filled 2021-07-26: qty 10

## 2021-07-26 MED ORDER — HEPARIN (PORCINE) IN NACL 1000-0.9 UT/500ML-% IV SOLN
INTRAVENOUS | Status: AC
  Filled 2021-07-26: qty 1000

## 2021-07-26 MED ORDER — SODIUM CHLORIDE 0.9% FLUSH: 3.0000 mL | INTRAVENOUS | Status: DC | PRN

## 2021-07-26 MED ORDER — HEPARIN (PORCINE) IN NACL 1000-0.9 UT/500ML-% IV SOLN
INTRAVENOUS | Status: DC | PRN
  Administered 2021-07-26 (×2): 500 mL

## 2021-07-26 MED ORDER — IODIXANOL 320 MG/ML IV SOLN
INTRAVENOUS | Status: DC | PRN
  Administered 2021-07-26: 160 mL via INTRA_ARTERIAL

## 2021-07-26 MED ORDER — FENTANYL CITRATE (PF) 100 MCG/2ML IJ SOLN
INTRAMUSCULAR | Status: DC | PRN
Start: 2021-07-26 — End: 2021-07-26
  Administered 2021-07-26: 25 ug via INTRAVENOUS

## 2021-07-26 MED ORDER — ACETAMINOPHEN 325 MG PO TABS: 650.0000 mg | ORAL_TABLET | ORAL | Status: DC | PRN

## 2021-07-26 MED ORDER — MIDAZOLAM HCL 2 MG/2ML IJ SOLN
INTRAMUSCULAR | Status: DC | PRN
  Administered 2021-07-26: 2 mg via INTRAVENOUS

## 2021-07-26 MED ORDER — ASPIRIN EC 81 MG PO TBEC
81.0000 mg | DELAYED_RELEASE_TABLET | Freq: Every day | ORAL | Status: DC
  Administered 2021-07-26: 81 mg via ORAL
  Filled 2021-07-26: qty 1

## 2021-07-26 MED ORDER — SODIUM CHLORIDE 0.9 % IV SOLN
INTRAVENOUS | Status: DC
Start: 2021-07-26 — End: 2021-07-26

## 2021-07-26 MED ORDER — MIDAZOLAM HCL 2 MG/2ML IJ SOLN
INTRAMUSCULAR | Status: AC
  Filled 2021-07-26: qty 2

## 2021-07-26 MED ORDER — SODIUM CHLORIDE 0.9 % WEIGHT BASED INFUSION: 1.0000 mL/kg/h | INTRAVENOUS | Status: DC

## 2021-07-26 MED ORDER — FENTANYL CITRATE (PF) 100 MCG/2ML IJ SOLN
INTRAMUSCULAR | Status: AC
  Filled 2021-07-26: qty 2

## 2021-07-26 MED ORDER — LABETALOL HCL 5 MG/ML IV SOLN
10.0000 mg | INTRAVENOUS | Status: DC | PRN
  Administered 2021-07-26: 10 mg via INTRAVENOUS
  Filled 2021-07-26: qty 4

## 2021-07-26 SURGICAL SUPPLY — 10 items
CATH ANGIO 5F PIGTAIL 100CM (CATHETERS) ×1 IMPLANT
DEVICE RAD COMP TR BAND LRG (VASCULAR PRODUCTS) ×1 IMPLANT
GLIDESHEATH SLEND SS 6F .021 (SHEATH) ×1 IMPLANT
GUIDEWIRE ANGLED .035X260CM (WIRE) ×1 IMPLANT
KIT PV (KITS) ×2 IMPLANT
SYR MEDRAD MARK V 150ML (SYRINGE) ×1 IMPLANT
TRANSDUCER W/STOPCOCK (MISCELLANEOUS) ×2 IMPLANT
TRAY PV CATH (CUSTOM PROCEDURE TRAY) ×2 IMPLANT
TUBING CONTRAST HIGH PRESS 48 (TUBING) ×1 IMPLANT
WIRE BENTSON .035X145CM (WIRE) ×1 IMPLANT

## 2021-07-26 NOTE — Interval H&P Note (Signed)
History and Physical Interval Note: ? ?07/26/2021 ?12:35 PM ? ?Cathy Jones  has presented today for surgery, with the diagnosis of right leg critical limb ischemia, occluded femoral to femoral bypass.  The various methods of treatment have been discussed with the patient and family. After consideration of risks, benefits and other options for treatment, the patient has consented to  Procedure(s): ?ABDOMINAL AORTOGRAM W/LOWER EXTREMITY (N/A) as a surgical intervention.  The patient's history has been reviewed, patient examined, no change in status, stable for surgery.  I have reviewed the patient's chart and labs.  Questions were answered to the patient's satisfaction.   ? ? ?Cathy Jones ? ? ?

## 2021-07-26 NOTE — Discharge Instructions (Signed)

## 2021-07-26 NOTE — Op Note (Signed)
DATE OF SERVICE: 07/26/2021 ? ?PATIENT:  Cathy Jones  61 y.o. female ? 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 ?POST-OPERATIVE DIAGNOSIS:  Same ? ?PROCEDURE:   ?1) US guided left radial artery access ?2) Left upper extremity angiogram ?3) Aortagram ?4) Bilateral lower extremity runoff angiogram (179m total contrast) ?5) Conscious sedation (35 minutes) ? ?SURGEON:  TYevonne Aline HStanford Breed MD ? ?ASSISTANT: none ? ?ANESTHESIA:   local and IV sedation ? ?ESTIMATED BLOOD LOSS: minimal ? ?LOCAL MEDICATIONS USED:  LIDOCAINE  ? ?COUNTS: confirmed correct. ? ?PATIENT DISPOSITION:  PACU - hemodynamically stable. ?  ?Delay start of Pharmacological VTE agent (>24hrs) due to surgical blood loss or risk of bleeding: no ? ?INDICATION FOR PROCEDURE: Cathy WHITSELis a 61y.o. female with ischemic rest pain of bilateral lower extremities. After careful discussion of risks, benefits, and alternatives the patient was offered angiography. The patient understood and wished to proceed. ? 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 ?Terminal aorta and iliac arteries: ?Aorta occluded immediately below the renal arteries ? ?Right lower extremity: ?Common femoral artery: occluded  ?Profunda femoris artery: reconstitutes via pelvic collaterals  ?Superficial femoral artery: reconstitutes via pelvic collaterals ?Popliteal artery: diseased, but patent ?Anterior tibial artery: patent at the origin ?Tibioperoneal trunk: patent  ?Peroneal artery: patent at the origin ?Posterior tibial artery: patent at the origin ?Pedal circulation: not visualized ? ?Left lower extremity: ?Common femoral artery: occluded  ?Profunda femoris artery: reconstitutes via pelvic collaterals  ?Superficial femoral artery: reconstitutes via pelvic collaterals ?Popliteal artery:  diseased, but patent ?Anterior tibial artery: patent at the origin ?Tibioperoneal trunk: patent  ?Peroneal artery: patent at the origin ?Posterior tibial artery: patent at the origin ?Pedal circulation: not visualized ? ?DESCRIPTION OF PROCEDURE: After identification of the patient in the pre-operative holding area, the patient was transferred to the operating room. The patient was positioned supine on the operating room table. Anesthesia was induced. The groins was prepped and draped in standard fashion. A surgical pause was performed confirming correct patient, procedure, and operative location. ? ?The left wrist was anesthetized with subcutaneous injection of 1% lidocaine. Using ultrasound guidance, the left radial artery was accessed with micropuncture technique. Fluoroscopy was used to confirm cannulation over the femoral head. The 49F sheath was upsized to 5/108F Slender sheath.  The patient was systemically heparinized. ? ?I attempted to navigate a Glidewire into the left subclavian artery.  The wire did not pass easily.  A retrograde brachial angiogram was performed.  This showed a chronic brachial occlusion.  I was able to cross this lesion without great difficulty.  The wire was advanced into the arch of the aorta.  Over the wire a pigtail catheter was advanced into the aorta.  The catheter was redirected into the descending aorta and the wire directed into the perivisceral aorta.  Over the wire the catheter was navigated to the level of L2.  Aortogram, and bilateral lower extremity runoff angiograms were performed.  See above for details. ? ?A transradial band was used to close the arteriotomy. Hemostasis was excellent upon completion. ? ?Conscious sedation was administered with the use of IV fentanyl and midazolam under continuous physician and nurse monitoring.  Heart rate, blood pressure, and oxygen saturation were continuously monitored.  Total sedation time was 35 minutes ? ?Upon completion of the case  instrument and sharps counts were  confirmed correct. The patient was transferred to the PACU in good condition. I was present for all portions of the procedure. ? ?PLAN: Aspirin 81 mg by mouth daily.  High intensity statin therapy.  Needs cardiology evaluation for preop restratification.  Not a candidate for aortobifemoral bypass, unfortunately, given history of abdominal surgery and radiation.  Extra-anatomic bypass will be necessary.  A right axillobifemoral bypass to be done after cardiology evaluation. ? ?[name]. Stanford Breed, MD ?[facility] [place] ?Office Phone Number: [phone] ?07/26/2021 12:35 PM ? ?

## 2021-07-29 ENCOUNTER — Encounter (HOSPITAL_COMMUNITY): Payer: Self-pay | Admitting: Vascular Surgery

## 2021-07-29 MED FILL — Lidocaine HCl Local Preservative Free (PF) Inj 1%: INTRAMUSCULAR | Qty: 30 | Status: AC

## 2021-07-30 ENCOUNTER — Telehealth: Payer: Self-pay

## 2021-07-30 ENCOUNTER — Encounter: Payer: Self-pay | Admitting: Vascular Surgery

## 2021-07-30 NOTE — Telephone Encounter (Signed)
Patient called and left a voice message wanting to know if someone has called to set her up with a Cardiologist appt. Will call back with more information. ?

## 2021-07-31 ENCOUNTER — Other Ambulatory Visit: Payer: Self-pay

## 2021-07-31 ENCOUNTER — Encounter: Payer: Self-pay | Admitting: Cardiovascular Disease

## 2021-07-31 ENCOUNTER — Ambulatory Visit: Payer: BC Managed Care – PPO | Admitting: Cardiovascular Disease

## 2021-07-31 VITALS — BP 182/84 | HR 77 | Ht 62.0 in | Wt 186.8 lb

## 2021-07-31 DIAGNOSIS — I739 Peripheral vascular disease, unspecified: Secondary | ICD-10-CM | POA: Diagnosis not present

## 2021-07-31 DIAGNOSIS — R0602 Shortness of breath: Secondary | ICD-10-CM | POA: Diagnosis not present

## 2021-07-31 DIAGNOSIS — F17200 Nicotine dependence, unspecified, uncomplicated: Secondary | ICD-10-CM

## 2021-07-31 DIAGNOSIS — Z01810 Encounter for preprocedural cardiovascular examination: Secondary | ICD-10-CM

## 2021-07-31 DIAGNOSIS — Z01818 Encounter for other preprocedural examination: Secondary | ICD-10-CM

## 2021-07-31 DIAGNOSIS — I10 Essential (primary) hypertension: Secondary | ICD-10-CM | POA: Diagnosis not present

## 2021-07-31 DIAGNOSIS — E785 Hyperlipidemia, unspecified: Secondary | ICD-10-CM

## 2021-07-31 MED ORDER — CARVEDILOL 6.25 MG PO TABS
6.2500 mg | ORAL_TABLET | Freq: Two times a day (BID) | ORAL | 3 refills | Status: DC
Start: 2021-07-31 — End: 2022-07-31

## 2021-07-31 MED ORDER — METOPROLOL TARTRATE 50 MG PO TABS: ORAL_TABLET | ORAL | 0 refills | Status: DC

## 2021-07-31 NOTE — Patient Instructions (Signed)
Medication Instructions:  ?START Carvedilol 6.25 mg twice daily ? ?*If you need a refill on your cardiac medications before your next appointment, please call your pharmacy* ? ? ?Lab Work: ?None ordered ?If you have labs (blood work) drawn today and your tests are completely normal, you will receive your results only by: ?MyChart Message (if you have MyChart) OR ?A paper copy in the mail ?If you have any lab test that is abnormal or we need to change your treatment, we will call you to review the results. ? ?Follow-Up: ?At [facility], you and your health needs are our priority.  As part of our continuing mission to provide you with exceptional heart care, we have created designated Provider Care Teams.  These Care Teams include your primary Cardiologist (physician) and Advanced Practice Providers (APPs -  Physician Assistants and Nurse Practitioners) who all work together to provide you with the care you need, when you need it. ? ?We recommend signing up for the patient portal called "MyChart".  Sign up information is provided on this After Visit Summary.  MyChart is used to connect with patients for Virtual Visits (Telemedicine).  Patients are able to view lab/test results, encounter notes, upcoming appointments, etc.  Non-urgent messages can be sent to your provider as well.   ?To learn more about what you can do with MyChart, go to [email or web address].   ? ?Your next appointment:   ?2 month(s) ? ?The format for your next appointment:   ?In Person ? ?Provider:   ?Sanda Klein, MD { ? ? ?Other Instructions ? ? ?Your cardiac CT will be scheduled at one of the below locations:  ? ?[facility] ?[address] ?[place], [place] [address] ?[phone] ? ?OR ? ?[place] ?[address] ?[address] ?[place], [place] [address] ?[phone] ? ?If scheduled at [facility], please arrive at the [name] and Children's Entrance (Entrance C2) of [facility] 30 minutes prior to test start time. ?You can use the FREE valet parking offered at entrance C (encouraged to control the heart rate for the test)  ?Proceed to the [facility] Radiology Department (first floor) to check-in and test prep. ? ?All radiology patients and guests should use entrance C2 at [facility], accessed from [employer], even though the hospital's physical address listed is [address]. ? ? ? ?If scheduled at [facility], please arrive 15 mins early for check-in and test prep. ? ?Please follow these instructions carefully (unless otherwise directed): ? ? ?On the Night Before the Test: ?Be sure to Drink plenty of water. ?Do not consume any caffeinated/decaffeinated beverages or chocolate 12 hours prior to your test. ?Do not take any antihistamines 12 hours prior to your test. ? ?On the Day of the Test: ?Drink plenty of water until 1 hour prior to the test. ?Do not eat any food 4 hours prior to the test. ?You may take your regular medications prior to the test.  ?Take metoprolol (Lopressor) two hours prior to test. ?FEMALES- please wear underwire-free bra if available, avoid dresses & tight clothing ? ?     ?After the Test: ?Drink plenty of water. ?After receiving IV contrast, you may experience a mild flushed feeling. This is normal. ?On occasion, you may experience a mild rash up to 24 hours after the test. This is not dangerous. If this occurs, you can take Benadryl 25 mg and increase your fluid intake. ?If you  experience trouble breathing, this can be serious. If it is severe call 911 IMMEDIATELY. If it is mild, please call our office. ?If you take any of these medications: Glipizide/Metformin, Avandament, Glucavance, please do not take 48 hours after completing test unless otherwise instructed. ? ?We will call to schedule your test 2-4 weeks out understanding that some insurance companies will need an authorization prior to the  service being performed.  ? ?For non-scheduling related questions, please contact the cardiac imaging nurse navigator should you have any questions/concerns: ?Marchia Bond, Cardiac Imaging Nurse Navigator ?Gordy Clement, Cardiac Imaging Nurse Navigator ? [facility] ?Direct Office Dial: [phone]  ? ?For scheduling needs, including cancellations and rescheduling, please call [place], [phone]. ? ? ? ?

## 2021-07-31 NOTE — Progress Notes (Signed)
Cardiology Office Note:    Date:  08/01/2021   ID:  Cathy Jones, DOB 06/29/1960, MRN [id number]  PCP:  Benita Stabile, MD   [facility] Providers Cardiologist:  Thurmon Fair, MD     Referring MD: Benita Stabile, MD   No chief complaint on file. Cathy Jones is a 61 y.o. female who is being seen today for the evaluation of PAD/preop CV risk at the request of Benita Stabile, MD.   History of Present Illness:    Cathy Jones is a 61 y.o. 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  Subsequently underwent angioplasty-stent of the right iliac artery in 2017 (iCAST 7x22), but later had to undergo repeat right iliac thrombectomy and left to right femorofemoral bypass with 8 mm Hemashield graft on 08/10/2015 by Dr. Arbie Cookey and then had to have a thrombectomy of the femorofemoral bypass just a month later.  She has a longstanding history of asthma.  She has a remote history of cervical cancer for which she underwent total abdominal hysterectomy, chemotherapy and radiation therapy and later developed a rectal stricture for which she underwent diverting loop ileostomy and ureteral stent placement in March 2017. Subsequently underwent angioplasty-stent of the right iliac artery , but later had to undergo repeat right iliac thrombectomy and left to right femorofemoral bypass with 8 mm Hemashield graft on 08/10/2015 by Dr. Arbie Cookey and then had to have a thrombectomy of the femorofemoral bypass just a month later. She later presented with acute renal failure related to high output ostomy.  She had closure of the ileostomy later the same year.  She has had problems with recurrent colonic stricture and reaccumulation of fluid collections in the abdominal wall requiring aspiration. CT of the abdomen with contrast performed in  September 2022 showed interval infrarenal aortic occlusion with collateral reconstitution of the left common femoral artery and patent femorofemoral bypass.    She has recurrent rest pain in her right foot and saw Dr. Arbie Cookey on March 15.  He recommended urgent arteriogram due to critical limb ischemia involving the right lower extremity (ABI 0.25 on the right and 0.42 on the left) with evidence of occlusion over the femorofemoral bypass.  It is anticipated she may need a redo femorofemoral bypass, complicated by the presence of a hostile abdomen.  It is hard to assess her functional status since she is limited by bilateral leg claudication.  She can walk about 150 feet before she has to stop.  She does experience shortness of breath, but the claudication is what stops her..  She has not been complaining of chest pain.  She denies palpitations, dizziness or syncope.  She has not had lower extremity edema and she does not have any nonhealing ulcers at this time.  She has never had focal neurological events to suggest stroke or TIA.  In the past she was told that she had hypertension, but this later resolved and she was taken off her medications.  She reports her blood pressure used to be routinely in the 130/70 range.  Recently her systolic blood pressure has been high.  It was 179/82 on 03 15 when she saw Dr. Arbie Cookey and it is still high today at 182/84, even after resting in the office for about 15 minutes.  She has a family history  of CAD, albeit not at very young ages.  Her father had bypass surgery in his mid 51s she has smoked roughly half a pack a day since age 8 for total of about 20 pack years.  She denies coughing or wheezing.  She does not have diabetes mellitus.  She has had problems with abnormal renal function tests in the past, but her most recent creatinine was 0.7.  I am unable to find a recent lipid profile, but she is taking rosuvastatin 5 mg daily.  In February 2017: total cholesterol 205,  triglycerides 99, HDL 65, LDL 140.  Carotid duplex ultrasound performed in April 2022 showed mild atheromatous plaque of the internal carotid artery origins with less than 50% stenoses.  CT abdomen shows very severe atherosclerotic calcification of the distal aorta and proximal common iliac arteries.  There remarkably less atherosclerosis in the upper half of the abdominal aorta and the small portion of the thoracic aorta that is visible.  Similarly, there is no coronary artery calcification, although the heart is not entirely covered by the scan.  Past Medical History:  Diagnosis Date   Acute renal failure (ARF) (HCC) 08/07/2015   Anemia    Asthma    pt brought her proair inhaler 06-06-14   Colon polyp    hyperplastic   Colon stricture (HCC)    Colonic obstruction (HCC)    Depression    Family history of adverse reaction to anesthesia    pts mother experiences nausea and vomiting    GERD (gastroesophageal reflux disease)    History of bronchitis    History of cervical cancer    Stage IB adenocarcinoma of the cervix, treated with rad hyst, 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   History of chemotherapy    History of hiatal hernia    History of radiation therapy    Hyperlipidemia    Hypertension    Iliac artery occlusion, 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   Migraine    "q couple years maybe" (08/07/2015)   Shortness of breath dyspnea    seasonal     Past Surgical History:  Procedure Laterality Date   ABDOMINAL AORTOGRAM W/LOWER EXTREMITY Bilateral 07/26/2021   Procedure: ABDOMINAL AORTOGRAM W/LOWER EXTREMITY;  Surgeon: Leonie Douglas, MD;  Location: [facility] INVASIVE CV LAB;  Service: Cardiovascular;  Laterality: Bilateral;   COLON RESECTION N/A 06/27/2015   Procedure: LYSIS OF ADHESIONS LOW ANTERIOR RESECTION DIVERTING LOOP ILEOSTOMY;  Surgeon: Romie Levee, MD;  Location: [facility] ORS;  Service: General;  Laterality: N/A;   COLON SURGERY     COLONOSCOPY     CYSTOSCOPY WITH STENT PLACEMENT Bilateral 06/27/2015    Procedure: CYSTOSCOPY WITH CATHETER  PLACEMENT;  Surgeon: Marcine Matar, MD;  Location: [facility] ORS;  Service: Urology;  Laterality: Bilateral;   DIVERTING ILEOSTOMY N/A 06/27/2015   Procedure: DIVERTING LOOP ILEOSTOMY;  Surgeon: Romie Levee, MD;  Location: [facility] ORS;  Service: General;  Laterality: N/A;   FEMORAL-FEMORAL BYPASS GRAFT Bilateral 08/10/2015   Procedure: BYPASS GRAFT LEFT FEMORALTO RIGHT FEMORAL ARTERY;  Surgeon: Larina Earthly, MD;  Location: [facility] OR;  Service: Vascular;  Laterality: Bilateral;   FLEXIBLE SIGMOIDOSCOPY  12/12/2015   Procedure: FLEXIBLE SIGMOIDOSCOPY;  Surgeon: Romie Levee, MD;  Location: [facility] ORS;  Service: General;;   ILEOSTOMY CLOSURE N/A 12/12/2015   Procedure: OPEN ILEOSTOMY REVERSAL AND PELVIC WASHOUT;  Surgeon: Romie Levee, MD;  Location: [facility] ORS;  Service: General;  Laterality: N/A;   INTRAOPERATIVE ARTERIOGRAM Right 08/10/2015   Procedure: INTRA OPERATIVE ARTERIOGRAM Right Iliac  Artery;  Surgeon: Larina Earthly, MD;  Location[facility] OR;  Service: Vascular;  Laterality: Right;   IR GENERIC HISTORICAL  09/12/2015   IR RADIOLOGIST EVAL & MGMT 09/12/2015 Ralene Muskrat, PA-C [facility] INTERV RAD   IR RADIOLOGIST EVAL & MGMT  06/23/2018   IR RADIOLOGIST EVAL & MGMT  07/07/2018   IV antibiotic infusion therapy      PERIPHERAL VASCULAR CATHETERIZATION N/A 08/09/2015   Procedure: Abdominal Aortogram w/Lower Extremity;  Surgeon: Fransisco Hertz, MD;  Location: [facility] INVASIVE CV LAB;  Service: Cardiovascular;  Laterality: N/A;   PERIPHERAL VASCULAR CATHETERIZATION Left 08/09/2015   Procedure: Peripheral Vascular Intervention;  Surgeon: Fransisco Hertz, MD;  Location: [facility] INVASIVE CV LAB;  Service: Cardiovascular;  Laterality: Left;  common iliac   PICC LINE PLACE PERIPHERAL ([facility] HX)     RADICAL ABDOMINAL HYSTERECTOMY  1994   THROMBECTOMY FEMORAL ARTERY Bilateral 09/05/2015   Procedure: THROMBECTOMY Left to Right Femoral to  FEMORAL ARTERY Bypass,.;  Surgeon: Pryor Ochoa, MD;   Location: [facility] OR;  Service: Vascular;  Laterality: Bilateral;   THROMBECTOMY ILIAC ARTERY Right 08/10/2015   Procedure: THROMBECTOMY Right ILIAC ARTERY;  Surgeon: Larina Earthly, MD;  Location: [facility] OR;  Service: Vascular;  Laterality: Right;   TUBAL LIGATION     UPPER GASTROINTESTINAL ENDOSCOPY      Current Medications: Current Meds  Medication Sig   carvedilol (COREG) 6.25 MG tablet Take 1 tablet (6.25 mg total) by mouth 2 (two) times daily.   metoprolol tartrate (LOPRESSOR) 50 MG tablet Take one tablet two hours prior to the test.   Probiotic Product (PROBIOTIC PO) Take 1 tablet by mouth every evening.   rosuvastatin (CRESTOR) 5 MG tablet Take 5 mg by mouth in the morning.   Vitamin D, Ergocalciferol, (DRISDOL) 1.25 MG (50000 UNIT) CAPS capsule Take 50,000 Units by mouth every Saturday.     Allergies:   Codeine   Social History   Socioeconomic History   Marital status: Married    Spouse name: Not on file   Number of children: 1   Years of education: Not on file   Highest education level: Not on file  Occupational History   Not on file  Tobacco Use   Smoking status: Every Day    Packs/day: 0.50    Years: 45.00    Pack years: 22.50    Types: Cigarettes    Start date: 05/17/1975   Smokeless tobacco: Never   Tobacco comments:    Less than 10 cigarettes per day  Vaping Use   Vaping Use: Former   Devices: e-cigrette with out nicotene for of the ostomy  Substance and Sexual Activity   Alcohol use: No    Alcohol/week: 0.0 standard drinks   Drug use: Not Currently    Types: Marijuana    Comment: previously marijuana   Sexual activity: Not Currently  Other Topics Concern   Not on file  Social History Narrative   Not on file   Social Determinants of Health   Financial Resource Strain: Not on file  Food Insecurity: Not on file  Transportation Needs: Not on file  Physical Activity: Not on file  Stress: Not on file  Social Connections: Not on file     Family  History: The patient's family history includes Breast cancer in her maternal aunt and maternal aunt; CAD in her father; Luiz Blare' disease in her father; Hyperparathyroidism in her mother; Hypertension in her father and mother; Peripheral vascular disease in her father;  Prostate cancer in her paternal uncle; Rectal cancer in her cousin. There is no history of Colon cancer, Stomach cancer, Esophageal cancer, Deep vein thrombosis, Uterine cancer, Endometrial cancer, or Pancreatic cancer.  ROS:   Please see the history of present illness.     All other systems reviewed and are negative.  EKGs/Labs/Other Studies Reviewed:    The following studies were reviewed today: Carotid duplex ultrasound April 2022 CT of the abdomen with contrast September 2022 Notes and imaging studies related to vascular procedures performed in 2017  EKG:  EKG is not ordered today.  The ekg ordered 07/26/2021 demonstrates normal sinus rhythm, low voltage, nonspecific ST-T wave changes in multiple leads  Recent Labs: 07/26/2021: BUN 13; Creatinine, Ser 0.70; Hemoglobin 16.3; Potassium 4.4; Sodium 141  Recent Lipid Panel No results found for: CHOL, TRIG, HDL, CHOLHDL, VLDL, LDLCALC, LDLDIRECT February 2017: total cholesterol 205, triglycerides 99, HDL 65, LDL 140.  Risk Assessment/Calculations:           Physical Exam:    VS:  BP (!) 182/84   Pulse 77   Ht 5\' 2"  (1.575 m)   Wt 186 lb 12.8 oz (84.7 kg)   SpO2 97%   BMI 34.17 kg/m     Wt Readings from Last 3 Encounters:  07/31/21 186 lb 12.8 oz (84.7 kg)  07/26/21 186 lb (84.4 kg)  07/24/21 186 lb 6.4 oz (84.6 kg)     GEN: Obese Well nourished, well developed in no acute distress HEENT: Normal NECK: No JVD; No carotid bruits LYMPHATICS: No lymphadenopathy CARDIAC: RRR, no murmurs, rubs, gallops RESPIRATORY:  Clear to auscultation without rales, wheezing or rhonchi  ABDOMEN: Soft, non-tender, non-distended MUSCULOSKELETAL:  No edema; No deformity No  palpable pulses in feet, but warm toes. SKIN: Warm and dry NEUROLOGIC:  Alert and oriented x 3 PSYCHIATRIC:  Normal affect   ASSESSMENT:    1. Pre-op evaluation   2. Shortness of breath   3. PAD (peripheral artery disease) (HCC)   4. Preoperative cardiovascular examination   5. Essential hypertension   6. Dyslipidemia   7. Smoking    PLAN:    In order of problems listed above:  PAD: She has remarkably severe atherosclerotic disease in the distal infrarenal aorta and the pelvic arteries, with very scanty atherosclerotic calcification in the upper part of the abdominal aorta.  Radiation may have played a big part in development of disease, but she also has a history of smoking and a family history of relatively early onset CAD.  Needs redo revascularization of the lower extremities. Preop CV risk eval: Unable to assess functional status since claudication limits her activity.  High risk of having some CAD due to her risk factors and the presence of PAD.  Recommend a coronary CT angiogram. HTN: Blood pressure was persistently high today, although at home she reports her blood pressure is usually 130s over 70s and she does not take antihypertensive medications (but she has not checked it recently).  Blood pressure remained high when I rechecked it 20 minutes later.  BP was 179/82 when she saw Dr. Arbie Cookey on March 15.  Did not see evidence of renal artery stenosis on her multiple abdominal CTs.  Will start carvedilol, for added cardiovascular protection around the time of vascular surgery.  Surgery is not imminent so she should have time to adjust to the new medication. HLP: No lipid profile available for review.  On statin.  Target LDL<70. Smoking: Strongly encouraged her efforts to quit smoking  permanently as soon as possible.           Medication Adjustments/Labs and Tests Ordered: Current medicines are reviewed at length with the patient today.  Concerns regarding medicines are outlined  above.  Orders Placed This Encounter  Procedures   CT CORONARY MORPH W/CTA COR W/SCORE W/CA W/CM &/OR WO/CM   Meds ordered this encounter  Medications   carvedilol (COREG) 6.25 MG tablet    Sig: Take 1 tablet (6.25 mg total) by mouth 2 (two) times daily.    Dispense:  180 tablet    Refill:  3   metoprolol tartrate (LOPRESSOR) 50 MG tablet    Sig: Take one tablet two hours prior to the test.    Dispense:  1 tablet    Refill:  0    Patient Instructions  Medication Instructions:  START Carvedilol 6.25 mg twice daily  *If you need a refill on your cardiac medications before your next appointment, please call your pharmacy*   Lab Work: None ordered If you have labs (blood work) drawn today and your tests are completely normal, you will receive your results only by: MyChart Message (if you have MyChart) OR A paper copy in the mail If you have any lab test that is abnormal or we need to change your treatment, we will call you to review the results.  Follow-Up: At [facility], you and your health needs are our priority.  As part of our continuing mission to provide you with exceptional heart care, we have created designated Provider Care Teams.  These Care Teams include your primary Cardiologist (physician) and Advanced Practice Providers (APPs -  Physician Assistants and Nurse Practitioners) who all work together to provide you with the care you need, when you need it.  We recommend signing up for the patient portal called "MyChart".  Sign up information is provided on this After Visit Summary.  MyChart is used to connect with patients for Virtual Visits (Telemedicine).  Patients are able to view lab/test results, encounter notes, upcoming appointments, etc.  Non-urgent messages can be sent to your provider as well.   To learn more about what you can do with MyChart, go to [email or web address].    Your next appointment:   2 month(s)  The format for your next appointment:   In  Person  Provider:   Thurmon Fair, MD {   Other Instructions   Your cardiac CT will be scheduled at one of the below locations:   [facility] [address] [place], [place] [address] [phone]  OR  [facility] [address] [place], [place] [address] [phone]  If scheduled at [facility], please arrive at the [facility] and Children's Entrance (Entrance C2) of [facility] 30 minutes prior to test start time. You can use the FREE valet parking offered at entrance C (encouraged to control the heart rate for the test)  Proceed to the [facility] Radiology Department (first floor) to check-in and test prep.  All radiology patients and guests should use entrance C2 at [facility], accessed from [facility], even though the hospital's physical address listed is [address].    If scheduled at [facility], please arrive 15 mins early for check-in and test prep.  Please follow these instructions carefully (unless otherwise directed):   On the Night Before the Test: Be sure to Drink plenty of water. Do not consume any caffeinated/decaffeinated beverages  or chocolate 12 hours prior to your test. Do not take any antihistamines 12 hours prior to your test.  On the Day of the Test: Drink plenty of water until 1 hour prior to the test. Do not eat any food 4 hours prior to the test. You may take your regular medications prior to the test.  Take metoprolol (Lopressor) two hours prior to test. FEMALES- please wear underwire-free bra if available, avoid dresses & tight clothing       After the Test: Drink plenty of water. After receiving IV contrast, you may experience a mild flushed feeling. This is normal. On occasion, you may experience a mild rash up to 24 hours after the test. This is not dangerous. If this occurs, you can take Benadryl 25 mg  and increase your fluid intake. If you experience trouble breathing, this can be serious. If it is severe call 911 IMMEDIATELY. If it is mild, please call our office. If you take any of these medications: Glipizide/Metformin, Avandament, Glucavance, please do not take 48 hours after completing test unless otherwise instructed.  We will call to schedule your test 2-4 weeks out understanding that some insurance companies will need an authorization prior to the service being performed.   For non-scheduling related questions, please contact the cardiac imaging nurse navigator should you have any questions/concerns: Rockwell Alexandria, Cardiac Imaging Nurse Navigator Larey Brick, Cardiac Imaging Nurse Navigator [facility] Direct Office Dial: [phone]   For scheduling needs, including cancellations and rescheduling, please call [place], [phone].      Signed, Thurmon Fair, MD  08/01/2021 1:39 PM    [facility]

## 2021-08-01 ENCOUNTER — Telehealth (HOSPITAL_COMMUNITY): Payer: Self-pay | Admitting: *Deleted

## 2021-08-01 NOTE — Telephone Encounter (Signed)
Reaching out to patient to offer assistance regarding upcoming cardiac imaging study; pt verbalizes understanding of appt date/time, parking situation and where to check in, pre-test NPO status and medications ordered, and verified current allergies; name and call back number provided for further questions should they arise ? ?Gordy Clement RN Navigator Cardiac Imaging ?[facility] ?[phone] office ?[phone] cell ? ?Patient to take '50mg'$  metoprolol tartrate two hours prior to her cardiac CT scan. She is aware to arrive at 7:15am for her 7:45am appt. ?

## 2021-08-05 ENCOUNTER — Other Ambulatory Visit: Payer: Self-pay

## 2021-08-05 ENCOUNTER — Ambulatory Visit (HOSPITAL_COMMUNITY)
Admission: RE | Admit: 2021-08-05 | Discharge: 2021-08-05 | Disposition: A | Discharge status: Home / Self Care | Payer: BC Managed Care – PPO | Source: Ambulatory Visit | Attending: Cardiovascular Disease | Admitting: Cardiovascular Disease

## 2021-08-05 ENCOUNTER — Encounter: Payer: Self-pay | Admitting: Internal Medicine

## 2021-08-05 ENCOUNTER — Other Ambulatory Visit (HOSPITAL_COMMUNITY): Payer: Self-pay | Admitting: Cardiovascular Disease

## 2021-08-05 DIAGNOSIS — Z789 Other specified health status: Secondary | ICD-10-CM

## 2021-08-05 DIAGNOSIS — Z01818 Encounter for other preprocedural examination: Secondary | ICD-10-CM | POA: Diagnosis not present

## 2021-08-05 DIAGNOSIS — R0602 Shortness of breath: Secondary | ICD-10-CM | POA: Diagnosis not present

## 2021-08-05 DIAGNOSIS — I739 Peripheral vascular disease, unspecified: Secondary | ICD-10-CM | POA: Diagnosis not present

## 2021-08-05 DIAGNOSIS — I2510 Atherosclerotic heart disease of native coronary artery without angina pectoris: Secondary | ICD-10-CM | POA: Diagnosis not present

## 2021-08-05 MED ORDER — IOHEXOL 350 MG/ML SOLN
100.0000 mL | Freq: Once | INTRAVENOUS | Status: AC | PRN
  Administered 2021-08-05: 100 mL via INTRAVENOUS

## 2021-08-05 MED ORDER — NITROGLYCERIN 0.4 MG SL SUBL
0.8000 mg | SUBLINGUAL_TABLET | Freq: Once | SUBLINGUAL | Status: AC
  Administered 2021-08-05: 0.8 mg via SUBLINGUAL

## 2021-08-05 MED ORDER — NITROGLYCERIN 0.4 MG SL SUBL
SUBLINGUAL_TABLET | SUBLINGUAL | Status: AC
  Filled 2021-08-05: qty 2

## 2021-08-09 ENCOUNTER — Ambulatory Visit (AMBULATORY_SURGERY_CENTER): Payer: BC Managed Care – PPO | Admitting: Internal Medicine

## 2021-08-09 ENCOUNTER — Encounter: Payer: Self-pay | Admitting: Internal Medicine

## 2021-08-09 VITALS — BP 139/73 | HR 64 | Temp 96.8°F | Resp 24 | Ht 62.0 in | Wt 179.0 lb

## 2021-08-09 DIAGNOSIS — D124 Benign neoplasm of descending colon: Secondary | ICD-10-CM | POA: Diagnosis not present

## 2021-08-09 DIAGNOSIS — D125 Benign neoplasm of sigmoid colon: Secondary | ICD-10-CM | POA: Diagnosis not present

## 2021-08-09 DIAGNOSIS — R933 Abnormal findings on diagnostic imaging of other parts of digestive tract: Secondary | ICD-10-CM | POA: Diagnosis not present

## 2021-08-09 DIAGNOSIS — Z1211 Encounter for screening for malignant neoplasm of colon: Secondary | ICD-10-CM

## 2021-08-09 DIAGNOSIS — R935 Abnormal findings on diagnostic imaging of other abdominal regions, including retroperitoneum: Secondary | ICD-10-CM

## 2021-08-09 DIAGNOSIS — K5900 Constipation, unspecified: Secondary | ICD-10-CM

## 2021-08-09 MED ORDER — SODIUM CHLORIDE 0.9 % IV SOLN: 500.0000 mL | Freq: Once | INTRAVENOUS | Status: DC

## 2021-08-09 NOTE — Progress Notes (Signed)
HISTORY OF PRESENT ILLNESS: ?  ?Cathy Jones is a 61 y.o. female with hypertension, hyperlipidemia, and remote history of cervical cancer for which she underwent total abdominal hysterectomy with lymph node dissection followed by radiation therapy (approximately 30 years ago).  The patient was sent to me in January 2016 for routine screening colonoscopy.  She was found to have high-grade distal sigmoid colon stricture not permitting passage of the endoscope.  Biopsies were negative.  She was brought back 2 weeks later with more aggressive bowel preparation.  Attempts to dilate the stricture were unsuccessful.  At that time the patient did report problems with constipation, abdominal bloating, and intermittent episodes of abdominal pain with vomiting.  This was felt to represent intermittent obstruction.  Due to these issues she had significant weight loss.  She was subsequently referred to general surgery for consideration of colonic resection.  She subsequently underwent open low anterior resection with diverting ileostomy in February 2017.  Postoperatively she had issues with high ostomy output for which she was hospitalized due to dehydration.  At 1 point she developed aortic clot with embolization requiring emergent vascular surgery (femorofemoral bypass).  Subsequently had a pelvic abscess.  She subsequently had open evacuation of a pelvic fluid collection with ileostomy reversal.  Over a year ago she developed problems with progressive intermittent numbness of the right buttock, right leg, and lower back when standing.  Work-up revealed a fluid-filled mass in the pelvis.  She was evaluated by her gynecologist September 2022.  She did have some transient urinary and fecal incontinence.  She was evaluated by her general surgeon November 2022.  Percutaneous drainage of 75 cc of fluid performed.  Cytology and cultures were negative.  She states this may have helped some of her symptoms of numbness.  She was  seen in follow-up by general surgery.  She did have transient incontinence with loose stools.  This has improved.  She was then referred to this office for follow-up colonoscopy.  She has had no follow-up imaging post drainage procedure.  She and her husband are disappointed in a lack of explanation for the fluid collection or definitive plans (other than colonoscopy).  She now tells me that she has bowel movements most days.  Occasionally will not have a bowel movement.  No longer having diarrhea.  Does since difficulty with bowel movements at times. ?  ?REVIEW OF SYSTEMS: ?  ?All non-GI ROS negative unless otherwise stated in the HPI except for fatigue, ankle swelling, excessive urination, urinary leakage ?  ?    ?Past Medical History:  ?Diagnosis Date  ? Acute renal failure (ARF) ([place]) 08/07/2015  ? Anemia    ? Asthma    ?  pt brought her proair inhaler 06-06-14  ? Colon polyp    ?  hyperplastic  ? Colon stricture ([place])    ? Colonic obstruction ([place])    ? Depression    ? Family history of adverse reaction to anesthesia    ?  pts mother experiences nausea and vomiting   ? GERD (gastroesophageal reflux disease)    ? History of bronchitis    ? History of cervical cancer    ?  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 History of chemotherapy    ? History of hiatal hernia    ? History of radiation therapy    ? Hyperlipidemia    ? Hypertension    ? 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 Migraine    ?  "q couple years maybe" (08/07/2015)  ? Shortness of breath dyspnea    ?  seasonal   ?  ?  ?     ?Past Surgical History:  ?Procedure Laterality Date  ? COLON RESECTION N/A 06/27/2015  ?  Procedure: LYSIS OF ADHESIONS LOW ANTERIOR RESECTION DIVERTING LOOP ILEOSTOMY;  Surgeon: Leighton Ruff, MD;  Location: [facility];  Service: General;  Laterality: N/A;  ? COLON SURGERY      ? COLONOSCOPY      ? CYSTOSCOPY WITH STENT PLACEMENT Bilateral 06/27/2015  ?  Procedure:  CYSTOSCOPY WITH CATHETER  PLACEMENT;  Surgeon: Franchot Gallo, MD;  Location: [facility];  Service: Urology;  Laterality: Bilateral;  ? DIVERTING ILEOSTOMY N/A 06/27/2015  ?  Procedure: DIVERTING LOOP ILEOSTOMY;  Surgeon: Leighton Ruff, MD;  Location: [facility];  Service: General;  Laterality: N/A;  ? FEMORAL-FEMORAL BYPASS GRAFT Bilateral 08/10/2015  ?  Procedure: BYPASS GRAFT LEFT FEMORALTO RIGHT FEMORAL ARTERY;  Surgeon: Rosetta Posner, MD;  Location: [facility];  Service: Vascular;  Laterality: Bilateral;  ? FLEXIBLE SIGMOIDOSCOPY   12/12/2015  ?  Procedure: FLEXIBLE SIGMOIDOSCOPY;  Surgeon: Leighton Ruff, MD;  Location: [facility];  Service: General;;  ? ILEOSTOMY CLOSURE N/A 12/12/2015  ?  Procedure: OPEN ILEOSTOMY REVERSAL AND PELVIC WASHOUT;  Surgeon: Leighton Ruff, MD;  Location: [facility];  Service: General;  Laterality: N/A;  ? INTRAOPERATIVE ARTERIOGRAM Right 08/10/2015  ?  Procedure: INTRA OPERATIVE ARTERIOGRAM Right Iliac Artery;  Surgeon: Rosetta Posner, MD;  Location: [facility];  Service: Vascular;  Laterality: Right;  ? IR GENERIC HISTORICAL   09/12/2015  ?  IR RADIOLOGIST EVAL & MGMT 09/12/2015 Monia Sabal, PA-C [facility] INTERV RAD  ? IR RADIOLOGIST EVAL & MGMT   06/23/2018  ? IR RADIOLOGIST EVAL & MGMT   07/07/2018  ? IV antibiotic infusion therapy       ? PERIPHERAL VASCULAR CATHETERIZATION N/A 08/09/2015  ?  Procedure: Abdominal Aortogram w/Lower Extremity;  Surgeon: Conrad Ajo, MD;  Location: [facility];  Service: Cardiovascular;  Laterality: N/A;  ? PERIPHERAL VASCULAR CATHETERIZATION Left 08/09/2015  ?  Procedure: Peripheral Vascular Intervention;  Surgeon: Conrad [place], MD;  Location: [facility];  Service: Cardiovascular;  Laterality: Left;  common iliac  ? PICC LINE PLACE PERIPHERAL ([place] [facility])      ? RADICAL ABDOMINAL HYSTERECTOMY   1994  ? THROMBECTOMY FEMORAL ARTERY Bilateral 09/05/2015  ?  Procedure: THROMBECTOMY Left to Right Femoral to  FEMORAL ARTERY Bypass,.;  Surgeon: Mal Misty, MD;   Location: [facility];  Service: Vascular;  Laterality: Bilateral;  ? THROMBECTOMY ILIAC ARTERY Right 08/10/2015  ?  Procedure: THROMBECTOMY Right ILIAC ARTERY;  Surgeon: Rosetta Posner, MD;  Location: [place];  Service: Vascular;  Laterality: Right;  ? TUBAL LIGATION      ? UPPER GASTROINTESTINAL ENDOSCOPY      ?  ?  ?Social History ?ABBEYGAIL IGOE  reports that she has been smoking cigarettes. She started smoking about 46 years ago. She has a 22.50 pack-year smoking history. She has never used smokeless tobacco. She reports that she does not currently use drugs after having used the following drugs: Marijuana. She reports that she does not drink alcohol. ?  ?family history includes Breast cancer in her maternal aunt and maternal aunt; CAD in her father; Berenice Primas' disease in her father; Hyperparathyroidism in her mother; Hypertension in her father  and mother; Peripheral vascular disease in her father; Prostate cancer in her paternal uncle; Rectal cancer in her cousin. ?  ?     ?Allergies  ?Allergen Reactions  ? Codeine Other (See Comments)  ?    "hyper"  ?  ?  ?  ?  ?PHYSICAL EXAMINATION: ?Vital signs: BP 138/76   Pulse 92   Ht '5\' 2"'$  (1.575 m)   Wt 179 lb 4 oz (81.3 kg)   SpO2 97%   BMI 32.79 kg/m?   ?Constitutional: Pleasant, generally well-appearing, no acute distress.  Smells of tobacco smoke ?Psychiatric: alert and oriented x3, cooperative ?Eyes: extraocular movements intact, anicteric, conjunctiva pink ?Mouth: oral pharynx moist, no lesions ?Neck: supple no lymphadenopathy ?Cardiovascular: heart regular rate and rhythm, no murmur ?Lungs: clear to auscultation bilaterally ?Abdomen: soft, nontender, nondistended, no obvious ascites, no peritoneal signs, normal bowel sounds, no organomegaly.  Multiple surgical incisions well-healed ?Rectal: Deferred until colonoscopy ?Extremities: no clubbing or cyanosis.  1+ lower extremity edema bilaterally ?Skin: no lesions on visible extremities ?Neuro: No focal deficits.  Cranial  nerves intact ?  ?ASSESSMENT: ?  ?1.  Remote history of cervical cancer status post total abdominal hysterectomy with lymph node dissection followed by radiation therapy. ?2.  Symptomatic sigmoid colon stricture status

## 2021-08-09 NOTE — Progress Notes (Signed)
VS by CW. ?

## 2021-08-09 NOTE — Patient Instructions (Signed)
Handouts on polyps given to patient. Await pathology results. ?Repeat colonoscopy in 7-10 years for surveillance purposes. ?Resume previous diet and continue present medications. ? ?YOU HAD AN ENDOSCOPIC PROCEDURE TODAY AT [facility]:   Refer to the procedure report that was given to you for any specific questions about what was found during the examination.  If the procedure report does not answer your questions, please call your gastroenterologist to clarify.  If you requested that your care partner not be given the details of your procedure findings, then the procedure report has been included in a sealed envelope for you to review at your convenience later. ? ?YOU SHOULD EXPECT: Some feelings of bloating in the abdomen. Passage of more gas than usual.  Walking can help get rid of the air that was put into your GI tract during the procedure and reduce the bloating. If you had a lower endoscopy (such as a colonoscopy or flexible sigmoidoscopy) you may notice spotting of blood in your stool or on the toilet paper. If you underwent a bowel prep for your procedure, you may not have a normal bowel movement for a few days. ? ?Please Note:  You might notice some irritation and congestion in your nose or some drainage.  This is from the oxygen used during your procedure.  There is no need for concern and it should clear up in a day or so. ? ?SYMPTOMS TO REPORT IMMEDIATELY: ? ?Following lower endoscopy (colonoscopy or flexible sigmoidoscopy): ? Excessive amounts of blood in the stool ? Significant tenderness or worsening of abdominal pains ? Swelling of the abdomen that is new, acute ? Fever of 100?F or higher ? ?For urgent or emergent issues, a gastroenterologist can be reached at any hour by calling [phone]. ?Do not use MyChart messaging for urgent concerns.  ? ? ?DIET:  We do recommend a small meal at first, but then you may proceed to your regular diet.  Drink plenty of fluids but you should  avoid alcoholic beverages for 24 hours. ? ?ACTIVITY:  You should plan to take it easy for the rest of today and you should NOT DRIVE or use heavy machinery until tomorrow (because of the sedation medicines used during the test).   ? ?FOLLOW UP: ?Our staff will call the number listed on your records 48-72 hours following your procedure to check on you and address any questions or concerns that you may have regarding the information given to you following your procedure. If we do not reach you, we will leave a message.  We will attempt to reach you two times.  During this call, we will ask if you have developed any symptoms of COVID 19. If you develop any symptoms (ie: fever, flu-like symptoms, shortness of breath, cough etc.) before then, please call [phone].  If you test positive for Covid 19 in the 2 weeks post procedure, please call and report this information to [place].   ? ?If any biopsies were taken you will be contacted by phone or by letter within the next 1-3 weeks.  Please call us at [phone] if you have not heard about the biopsies in 3 weeks.  ? ? ?SIGNATURES/CONFIDENTIALITY: ?You and/or your care partner have signed paperwork which will be entered into your electronic medical record.  These signatures attest to the fact that that the information above on your After Visit Summary has been reviewed and is understood.  Full responsibility of the confidentiality of this discharge information lies  with you and/or your care-partner.  ?

## 2021-08-09 NOTE — Progress Notes (Signed)
Report to PACU RN VSS ?

## 2021-08-09 NOTE — Progress Notes (Signed)
Called to room to assist during endoscopic procedure.  Patient ID and intended procedure confirmed with present staff. Received instructions for my participation in the procedure from the performing physician.  

## 2021-08-09 NOTE — Op Note (Signed)
[place] ?Patient Name: Cathy Jones ?Procedure Date: 08/09/2021 12:17 PM ?MRN: [id number] ?Endoscopist: Docia Chuck. Henrene Pastor , MD ?Age: 61 ?Referring MD:  ?Date of Birth: [birth year] ?Gender: Female ?Account #: [id number] ?Procedure:                Colonoscopy cold snare polypectomy x 2 ?Indications:              Screening for colorectal malignant neoplasm ?Medicines:                Monitored Anesthesia Care ?Procedure:                Pre-Anesthesia Assessment: ?                          - Prior to the procedure, a History and Physical  ?                          was performed, and patient medications and  ?                          allergies were reviewed. The patient's tolerance of  ?                          previous anesthesia was also reviewed. The risks  ?                          and benefits of the procedure and the sedation  ?                          options and risks were discussed with the patient.  ?                          All questions were answered, and informed consent  ?                          was obtained. Prior Anticoagulants: The patient has  ?                          taken no previous anticoagulant or antiplatelet  ?                          agents. ASA Grade Assessment: II - A patient with  ?                          mild systemic disease. After reviewing the risks  ?                          and benefits, the patient was deemed in  ?                          satisfactory condition to undergo the procedure. ?                          After obtaining informed consent, the colonoscope  ?  was passed under direct vision. Throughout the  ?                          procedure, the patient's blood pressure, pulse, and  ?                          oxygen saturations were monitored continuously. The  ?                          PCF-HQ190L Colonoscope was introduced through the  ?                          anus and advanced to the the cecum, identified by  ?                           appendiceal orifice and ileocecal valve. The  ?                          ileocecal valve, appendiceal orifice, and rectum  ?                          were photographed. The quality of the bowel  ?                          preparation was good. The colonoscopy was performed  ?                          without difficulty. The patient tolerated the  ?                          procedure well. The bowel preparation used was  ?                          SUPREP via split dose instruction. ?Scope In: 12:23:36 PM ?Scope Out: 12:36:50 PM ?Scope Withdrawal Time: 0 hours 8 minutes 58 seconds  ?Total Procedure Duration: 0 hours 13 minutes 14 seconds  ?Findings:                 Two polyps were found in the sigmoid colon and  ?                          descending colon. The polyps were 3 to 5 mm in  ?                          size. These polyps were removed with a cold snare.  ?                          Resection and retrieval were complete. ?                          The rectosigmoid anastomosis was at 10 cm and  ?                          appeared normal. Exam  was otherwise without  ?                          abnormality on direct and retroflexion views. ?Complications:            No immediate complications. Estimated blood loss:  ?                          None. ?Estimated Blood Loss:     Estimated blood loss: none. ?Impression:               - Two 3 to 5 mm polyps in the sigmoid colon and in  ?                          the descending colon, removed with a cold snare.  ?                          Resected and retrieved. ?                          - The examination was otherwise normal on direct  ?                          and retroflexion views. ?Recommendation:           - Repeat colonoscopy in 7 -10 years for  ?                          surveillance. ?                          - Patient has a contact number available for  ?                          emergencies. The signs and symptoms of potential  ?                           delayed complications were discussed with the  ?                          patient. Return to normal activities tomorrow.  ?                          Written discharge instructions were provided to the  ?                          patient. ?                          - Resume previous diet. ?                          - Continue present medications. ?                          - Await pathology results. ?Docia Chuck. Henrene Pastor, MD ?08/09/2021 12:43:29 PM ?This report has been signed electronically. ?

## 2021-08-13 ENCOUNTER — Telehealth: Payer: Self-pay | Admitting: *Deleted

## 2021-08-13 NOTE — Telephone Encounter (Signed)
?  Follow up Call- ? ? ?  08/09/2021  ? 11:20 AM  ?Call back number  ?Post procedure Call Back phone  # [phone]  ?Permission to leave phone message Yes  ?  ? ?Patient questions: ? ?Do you have a fever, pain , or abdominal swelling? No. ?Pain Score  0 * ? ?Have you tolerated food without any problems? Yes.   ? ?Have you been able to return to your normal activities? Yes.   ? ?Do you have any questions about your discharge instructions: ?Diet   No. ?Medications  No. ?Follow up visit  No. ? ?Do you have questions or concerns about your Care? No. ? ?Actions: ?* If pain score is 4 or above: ?No action needed, pain <4. ? ?Patient stated she had a cardiac cath the Monday before her procedure and was wondering when she needed to return to cardiologist. Referred her back to her cardiologist.  ? ? ?

## 2021-08-14 ENCOUNTER — Other Ambulatory Visit: Payer: Self-pay

## 2021-08-14 ENCOUNTER — Encounter: Payer: Self-pay | Admitting: Internal Medicine

## 2021-08-14 DIAGNOSIS — I70223 Atherosclerosis of native arteries of extremities with rest pain, bilateral legs: Secondary | ICD-10-CM

## 2021-08-21 NOTE — Progress Notes (Signed)
Surgical Instructions ? ? ? Your procedure is scheduled on Monday, April 17th. ? Report to [facility] Main Entrance "A" at 5:30 A.M., then check in with the Admitting office. ? Call this number if you have problems the morning of surgery: ? [phone] ? ? If you have any questions prior to your surgery date call [phone]: Open Monday-Friday 8am-4pm ? ? ? Remember: ? Do not eat or drink after midnight the night before your surgery ? ?  ? Take these medicines the morning of surgery with A SIP OF WATER:  ? Carvedilol (Coreg) ? Rosuvastatin (Crestor) ? ?As of today, STOP taking any Aspirin (unless otherwise instructed by your surgeon) Aleve, Naproxen, Ibuprofen, Motrin, Advil, Goody's, BC's, all herbal medications, fish oil, and all vitamins. ? ?         DAY OF SURGERY: ?Do not wear jewelry or makeup ?Do not wear lotions, powders, perfumes, or deodorant. ?Do not shave 48 hours prior to surgery.   ?Do not bring valuables to the hospital. ?Do not wear nail polish, gel polish, artificial nails, or any other type of covering on natural nails (fingers and toes) ?If you have artificial nails or gel coating that need to be removed by a nail salon, please have this removed prior to surgery. Artificial nails or gel coating may interfere with anesthesia's ability to adequately monitor your vital signs. ? ?[name] is not responsible for any belongings or valuables. .  ? ?Do NOT Smoke (Tobacco/Vaping)  24 hours prior to your procedure ? ?If you use a CPAP at night, you may bring your mask for your overnight stay. ?  ?Contacts, glasses, hearing aids, dentures or partials may not be worn into surgery, please bring cases for these belongings ?  ?For patients admitted to the hospital, discharge time will be determined by your treatment team. ?  ?Patients discharged the day of surgery will not be allowed to drive home, and someone needs to stay with them for 24 hours. ? ? ?SURGICAL WAITING ROOM VISITATION ?Patients having  surgery or a procedure in a hospital may have two support people. ?Children under the age of 60 must have an adult with them who is not the patient. ?They may stay in the waiting area during the procedure and may switch out with other visitors. If the patient needs to stay at the hospital during part of their recovery, the visitor guidelines for inpatient rooms apply. ? ?Please refer to the [facility] website for the visitor guidelines for Inpatients (after your surgery is over and you are in a regular room).  ? ? ?Special instructions:   ? ?Oral Hygiene is also important to reduce your risk of infection.  Remember - BRUSH YOUR TEETH THE MORNING OF SURGERY WITH YOUR REGULAR TOOTHPASTE ? ? ?- Preparing For Surgery ? ?Before surgery, you can play an important role. Because skin is not sterile, your skin needs to be as free of germs as possible. You can reduce the number of germs on your skin by washing with CHG (chlorahexidine gluconate) Soap before surgery.  CHG is an antiseptic cleaner which kills germs and bonds with the skin to continue killing germs even after washing.   ? ? ?Please do not use if you have an allergy to CHG or antibacterial soaps. If your skin becomes reddened/irritated stop using the CHG.  ?Do not shave (including legs and underarms) for at least 48 hours prior to first CHG shower. It is OK to shave your face. ? ?  Please follow these instructions carefully. ?  ? ? Shower the NIGHT BEFORE SURGERY and the MORNING OF SURGERY with CHG Soap.  ? If you chose to wash your hair, wash your hair first as usual with your normal shampoo. After you shampoo, rinse your hair and body thoroughly to remove the shampoo.  Then [employer] and genitals (private parts) with your normal soap and rinse thoroughly to remove soap. ? ?After that Use CHG Soap as you would any other liquid soap. You can apply CHG directly to the skin and wash gently with a scrungie or a clean washcloth.  ? ?Apply the CHG Soap to  your body ONLY FROM THE NECK DOWN.  Do not use on open wounds or open sores. Avoid contact with your eyes, ears, mouth and genitals (private parts). Wash Face and genitals (private parts)  with your normal soap.  ? ?Wash thoroughly, paying special attention to the area where your surgery will be performed. ? ?Thoroughly rinse your body with warm water from the neck down. ? ?DO NOT shower/wash with your normal soap after using and rinsing off the CHG Soap. ? ?Pat yourself dry with a CLEAN TOWEL. ? ?Wear CLEAN PAJAMAS to bed the night before surgery ? ?Place CLEAN SHEETS on your bed the night before your surgery ? ?DO NOT SLEEP WITH PETS. ? ? ?Day of Surgery: ? ?Take a shower with CHG soap. ?Wear Clean/Comfortable clothing the morning of surgery ?Do not apply any deodorants/lotions.   ?Remember to brush your teeth WITH YOUR REGULAR TOOTHPASTE. ? ?  ?Please read over the following fact sheets that you were given.  ? ?

## 2021-08-22 ENCOUNTER — Encounter (HOSPITAL_COMMUNITY): Payer: Self-pay

## 2021-08-22 ENCOUNTER — Encounter (HOSPITAL_COMMUNITY)
Admission: RE | Admit: 2021-08-22 | Discharge: 2021-08-22 | Disposition: A | Discharge status: Home / Self Care | Payer: BC Managed Care – PPO | Source: Ambulatory Visit | Attending: Vascular Surgery | Admitting: Vascular Surgery

## 2021-08-22 ENCOUNTER — Other Ambulatory Visit: Payer: Self-pay

## 2021-08-22 VITALS — BP 140/90 | HR 85 | Temp 98.3°F | Resp 18 | Ht 62.0 in | Wt 188.7 lb

## 2021-08-22 DIAGNOSIS — E785 Hyperlipidemia, unspecified: Secondary | ICD-10-CM | POA: Insufficient documentation

## 2021-08-22 DIAGNOSIS — Z79899 Other long term (current) drug therapy: Secondary | ICD-10-CM | POA: Insufficient documentation

## 2021-08-22 DIAGNOSIS — I70421 Atherosclerosis of autologous vein bypass graft(s) of the extremities with rest pain, right leg: Secondary | ICD-10-CM | POA: Insufficient documentation

## 2021-08-22 DIAGNOSIS — Z01818 Encounter for other preprocedural examination: Secondary | ICD-10-CM

## 2021-08-22 DIAGNOSIS — I10 Essential (primary) hypertension: Secondary | ICD-10-CM | POA: Diagnosis not present

## 2021-08-22 DIAGNOSIS — Z86718 Personal history of other venous thrombosis and embolism: Secondary | ICD-10-CM | POA: Insufficient documentation

## 2021-08-22 DIAGNOSIS — I70223 Atherosclerosis of native arteries of extremities with rest pain, bilateral legs: Secondary | ICD-10-CM | POA: Diagnosis not present

## 2021-08-22 DIAGNOSIS — Z95820 Peripheral vascular angioplasty status with implants and grafts: Secondary | ICD-10-CM | POA: Diagnosis not present

## 2021-08-22 DIAGNOSIS — Z01812 Encounter for preprocedural laboratory examination: Secondary | ICD-10-CM | POA: Diagnosis not present

## 2021-08-22 DIAGNOSIS — I2510 Atherosclerotic heart disease of native coronary artery without angina pectoris: Secondary | ICD-10-CM | POA: Diagnosis not present

## 2021-08-22 LAB — URINALYSIS, ROUTINE W REFLEX MICROSCOPIC
Bilirubin Urine: NEGATIVE
Glucose, UA: NEGATIVE mg/dL
Hgb urine dipstick: NEGATIVE
Ketones, ur: NEGATIVE mg/dL
Nitrite: NEGATIVE
Protein, ur: NEGATIVE mg/dL
Specific Gravity, Urine: 1.016 (ref 1.005–1.030)
pH: 7 (ref 5.0–8.0)

## 2021-08-22 LAB — COMPREHENSIVE METABOLIC PANEL
ALT: 9 U/L (ref 0–44)
AST: 10 U/L — ABNORMAL LOW (ref 15–41)
Albumin: 4 g/dL (ref 3.5–5.0)
Alkaline Phosphatase: 96 U/L (ref 38–126)
Anion gap: 6 (ref 5–15)
BUN: 12 mg/dL (ref 6–20)
CO2: 27 mmol/L (ref 22–32)
Calcium: 9.3 mg/dL (ref 8.9–10.3)
Chloride: 109 mmol/L (ref 98–111)
Creatinine, Ser: 0.93 mg/dL (ref 0.44–1.00)
GFR, Estimated: >60 mL/min (ref >60)
Glucose, Bld: 112 mg/dL — ABNORMAL HIGH (ref 70–99)
Potassium: 4.2 mmol/L (ref 3.5–5.1)
Sodium: 142 mmol/L (ref 135–145)
Total Bilirubin: 0.1 mg/dL — ABNORMAL LOW (ref 0.3–1.2)
Total Protein: 6.9 g/dL (ref 6.5–8.1)

## 2021-08-22 LAB — SURGICAL PCR SCREEN

## 2021-08-22 LAB — CBC
HCT: 47 % — ABNORMAL HIGH (ref 36.0–46.0)
Hemoglobin: 14.7 g/dL (ref 12.0–15.0)
MCH: 27.1 pg (ref 26.0–34.0)
MCHC: 31.3 g/dL (ref 30.0–36.0)
MCV: 86.7 fL (ref 80.0–100.0)
Platelets: 197 10*3/uL (ref 150–400)
RBC: 5.42 MIL/uL — ABNORMAL HIGH (ref 3.87–5.11)
RDW: 14.5 % (ref 11.5–15.5)
WBC: 10.9 10*3/uL — ABNORMAL HIGH (ref 4.0–10.5)
nRBC: 0 % (ref 0.0–0.2)

## 2021-08-22 LAB — TYPE AND SCREEN
ABO/RH(D): A POS
Antibody Screen: NEGATIVE

## 2021-08-22 LAB — APTT: aPTT: 28 seconds (ref 24–36)

## 2021-08-22 LAB — PROTIME-INR
INR: 1 (ref 0.8–1.2)
Prothrombin Time: 12.8 seconds (ref 11.4–15.2)

## 2021-08-22 NOTE — Progress Notes (Signed)
PCP - Dr. Allyn Kenner ?Cardiologist - Dr. Dani Gobble Croitoru- pre-op eval ? ?PPM/ICD - denies ? ? ?Chest x-ray - 12/12/15 ?EKG - 07/26/21 ?Stress Test - pt unsure if she has had one, no records found  ?ECHO - denies ?Cardiac Cath - denies ?Peripheral vascular cath- 07/26/21 ? ? ?Sleep Study - denies ? ? ?DM- denies ? ?Blood Thinner Instructions: n/a ?Aspirin Instructions: continue thru DOS ? ?ERAS Protcol -no, NPO ? ? ?COVID TEST- n/a ? ? ?Anesthesia review: yes, cardiac pre-op eval ? ?Patient denies shortness of breath, fever, cough and chest pain at PAT appointment ? ? ?All instructions explained to the patient, with a verbal understanding of the material. Patient agrees to go over the instructions while at home for a better understanding. Patient also instructed to notify surgeon of any contact with COVID+ person or if she develops any symptoms. The opportunity to ask questions was provided. ?  ?

## 2021-08-23 ENCOUNTER — Telehealth: Payer: Self-pay

## 2021-08-23 MED ORDER — SULFAMETHOXAZOLE-TRIMETHOPRIM 800-160 MG PO TABS: 1.0000 | ORAL_TABLET | Freq: Two times a day (BID) | ORAL | 0 refills | Status: DC

## 2021-08-23 NOTE — Telephone Encounter (Signed)
Spoke with patient regarding abnormal pre-op urinalysis results. Will send Rx for Bactrim DS twice daily x 5 days to patient's pharmacy and repeat U/A morning of surgery on 08/26/21 per Dr. Nicole Cella recommendations. Patient verbalized understanding.  ?

## 2021-08-23 NOTE — Anesthesia Preprocedure Evaluation (Addendum)
Anesthesia Evaluation  ?Patient identified by MRN, date of birth, ID band ?Patient awake ? ? ? ?Reviewed: ?Allergy & Precautions, H&P , NPO status , Patient's Chart, lab work & pertinent test results ? ?Airway ?Mallampati: II ? ? ?Neck ROM: full ? ? ? Dental ?  ?Pulmonary ?shortness of breath, asthma , Current Smoker,  ?  ?breath sounds clear to auscultation ? ? ? ? ? ? Cardiovascular ?hypertension, + Peripheral Vascular Disease  ? ?Rhythm:regular Rate:Normal ? ? ?  ?Neuro/Psych ? Headaches, PSYCHIATRIC DISORDERS Anxiety Depression   ? GI/Hepatic ?GERD  ,  ?Endo/Other  ? ? Renal/GU ?  ? ?  ?Musculoskeletal ? ? Abdominal ?  ?Peds ? Hematology ?  ?Anesthesia Other Findings ? ? Reproductive/Obstetrics ? ?  ? ? ? ? ? ? ? ? ? ? ? ? ? ?  ?  ? ? ? ? ? ? ? ?Anesthesia Physical ?Anesthesia Plan ? ?ASA: 3 ? ?Anesthesia Plan: General  ? ?Post-op Pain Management:   ? ?Induction: Intravenous ? ?PONV Risk Score and Plan: 2 and Ondansetron, Dexamethasone, Midazolam and Treatment may vary due to age or medical condition ? ?Airway Management Planned: Oral ETT ? ?Additional Equipment: Arterial line ? ?Intra-op Plan:  ? ?Post-operative Plan: Extubation in OR ? ?Informed Consent: I have reviewed the patients History and Physical, chart, labs and discussed the procedure including the risks, benefits and alternatives for the proposed anesthesia with the patient or authorized representative who has indicated his/her understanding and acceptance.  ? ? ? ?Dental advisory given ? ?Plan Discussed with: CRNA, Anesthesiologist and Surgeon ? 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 Subsequently underwent angioplasty-stent?of the right iliac artery in 2017?(iCAST 7x22), but later had to  undergo repeat right iliac thrombectomy and left to right femorofemoral bypass with 8 mm Hemashield graft on 08/10/2015 by Dr.?Early and then had to have a thrombectomy of the femorofemoral bypass just a month later. ? ?Patient does not have any known history of coronary disease but given risk factors and low functional status, she was referred to have cardiology eval prior to surgery by her PCP.  She was seen by Dr. Sallyanne Kuster on 07/31/2021.  Per note, "Preop CV risk eval:?Unable to assess functional status since claudication limits her activity. ?High risk of having some CAD due to her risk factors and the presence of PAD. ?Recommend a coronary CT angiogram."  He also added carvedilol for help with blood pressure control and cardiovascular protection around surgery.  Coronary CTA 08/05/2021 showed high calcium score for age, but no indication of obstructive disease.  Dr. Sallyanne Kuster commented on result 08/14/2021 stating, "Coronary CT does not show serious coronary obstruction, but as expected that showed extensive plaque. ?Very important to keep the cholesterol low (LDL under 70). ?How is the blood pressure on carvedilol? ?If higher than 140/90, will need to adjust the dose further, to get the blood pressure range by the time of her surgery." ? ?Blood pressure at PAT appointment 140/90. ? ?Preop labs reviewed, unremarkable. ? ?EKG 07/26/2021: Normal sinus rhythm.  Rate 71. Low voltage QRS ? ?Coronary CT 08/05/2021: ?IMPRESSION: ?1. Coronary artery calcium score 305 Agatston units. This places the ?patient in the 97th percentile for age and gender, suggesting high ?risk for future cardiac events. ?? ?2. ?Poor quality study  due to poor contrast opacification. ?? ?3. Probably mild disease in the LAD and RCA. Difficult to quantify ?the LCx disease but probably not severe. Study does not appear good ?enough to send for FFR. ? ?Carotid ultrasound 08/23/2020: ?IMPRESSION: ?Mild atheromatous plaque of the internal carotid artery  origins with ?velocity parameters indicating less than 50% stenosis. ?)  ? ? ? ? ? ?Anesthesia Quick Evaluation ? ?

## 2021-08-23 NOTE — Progress Notes (Signed)
Anesthesia Chart Review: ? 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 Subsequently underwent angioplasty-stent of the right iliac artery in 2017 (iCAST 7x22), but later had to undergo repeat right iliac thrombectomy and left to right femorofemoral bypass with 8 mm Hemashield graft on 08/10/2015 by Dr. Donnetta Hutching and then had to have a thrombectomy of the femorofemoral bypass just a month later. ? ?Patient does not have any known history of coronary disease but given risk factors and low functional status, she was referred to have cardiology eval prior to surgery by her PCP.  She was seen by Dr. Sallyanne Kuster on 07/31/2021.  Per note, "Preop CV risk eval: Unable to assess functional status since claudication limits her activity.  High risk of having some CAD due to her risk factors and the presence of PAD.  Recommend a coronary CT angiogram."  He also added carvedilol for help with blood pressure control and cardiovascular protection around surgery.  Coronary CTA 08/05/2021 showed high calcium score for age, but no indication of obstructive disease.  Dr. Sallyanne Kuster commented on result 08/14/2021 stating, "Coronary CT does not show serious coronary obstruction, but as expected that showed extensive plaque.  Very important to keep the cholesterol low (LDL under 70).  How is the blood pressure on carvedilol?  If higher than 140/90, will need to adjust the dose further, to get the blood pressure range by the time of her surgery." ? ?Blood pressure at PAT appointment 140/90. ? ?Preop labs reviewed, unremarkable. ? ?EKG 07/26/2021: Normal sinus rhythm.  Rate 71. Low voltage QRS ? ?Coronary CT 08/05/2021: ?IMPRESSION: ?1. Coronary artery calcium score 305 Agatston units. This places the ?patient in the 97th percentile for age and gender, suggesting high ?risk for future  cardiac events. ?  ?2.  Poor quality study due to poor contrast opacification. ?  ?3. Probably mild disease in the LAD and RCA. Difficult to quantify ?the LCx disease but probably not severe. Study does not appear good ?enough to send for FFR. ? ?Carotid ultrasound 08/23/2020: ?IMPRESSION: ?Mild atheromatous plaque of the internal carotid artery origins with ?velocity parameters indicating less than 50% stenosis. ? ? ? ?Cathy Caldwell, PA-C ?[facility]/Anesthesiology ?Phone [phone] ?08/23/2021 10:02 AM ? ?

## 2021-08-26 ENCOUNTER — Encounter (HOSPITAL_COMMUNITY)
Admission: RE | Disposition: A | Discharge status: Home / Self Care | Payer: Self-pay | Source: Home / Self Care | Attending: Vascular Surgery

## 2021-08-26 ENCOUNTER — Other Ambulatory Visit: Payer: Self-pay

## 2021-08-26 ENCOUNTER — Inpatient Hospital Stay (HOSPITAL_COMMUNITY): Payer: BC Managed Care – PPO | Admitting: Certified Registered Nurse Anesthetist

## 2021-08-26 ENCOUNTER — Inpatient Hospital Stay (HOSPITAL_COMMUNITY)
Admission: RE | Admit: 2021-08-26 | Discharge: 2021-08-29 | DRG: 254 | Disposition: A | Discharge status: Home / Self Care | Payer: BC Managed Care – PPO | Attending: Vascular Surgery | Admitting: Vascular Surgery

## 2021-08-26 ENCOUNTER — Encounter (HOSPITAL_COMMUNITY): Payer: Self-pay | Admitting: Vascular Surgery

## 2021-08-26 ENCOUNTER — Inpatient Hospital Stay (HOSPITAL_COMMUNITY): Payer: BC Managed Care – PPO | Admitting: Vascular Surgery

## 2021-08-26 DIAGNOSIS — I10 Essential (primary) hypertension: Secondary | ICD-10-CM | POA: Diagnosis present

## 2021-08-26 DIAGNOSIS — Z923 Personal history of irradiation: Secondary | ICD-10-CM | POA: Diagnosis not present

## 2021-08-26 DIAGNOSIS — Z885 Allergy status to narcotic agent status: Secondary | ICD-10-CM

## 2021-08-26 DIAGNOSIS — Z8249 Family history of ischemic heart disease and other diseases of the circulatory system: Secondary | ICD-10-CM | POA: Diagnosis not present

## 2021-08-26 DIAGNOSIS — I70223 Atherosclerosis of native arteries of extremities with rest pain, bilateral legs: Secondary | ICD-10-CM | POA: Diagnosis not present

## 2021-08-26 DIAGNOSIS — Z01818 Encounter for other preprocedural examination: Principal | ICD-10-CM

## 2021-08-26 DIAGNOSIS — E785 Hyperlipidemia, unspecified: Secondary | ICD-10-CM | POA: Diagnosis present

## 2021-08-26 DIAGNOSIS — J45909 Unspecified asthma, uncomplicated: Secondary | ICD-10-CM | POA: Diagnosis not present

## 2021-08-26 DIAGNOSIS — R8290 Unspecified abnormal findings in urine: Secondary | ICD-10-CM

## 2021-08-26 DIAGNOSIS — Z9221 Personal history of antineoplastic chemotherapy: Secondary | ICD-10-CM

## 2021-08-26 DIAGNOSIS — Z8541 Personal history of malignant neoplasm of cervix uteri: Secondary | ICD-10-CM

## 2021-08-26 DIAGNOSIS — K219 Gastro-esophageal reflux disease without esophagitis: Secondary | ICD-10-CM | POA: Diagnosis not present

## 2021-08-26 DIAGNOSIS — F17210 Nicotine dependence, cigarettes, uncomplicated: Secondary | ICD-10-CM | POA: Diagnosis present

## 2021-08-26 DIAGNOSIS — F418 Other specified anxiety disorders: Secondary | ICD-10-CM | POA: Diagnosis not present

## 2021-08-26 DIAGNOSIS — I739 Peripheral vascular disease, unspecified: Secondary | ICD-10-CM | POA: Diagnosis present

## 2021-08-26 HISTORY — PX: AXILLARY-FEMORAL BYPASS GRAFT: SHX894

## 2021-08-26 LAB — POCT ACTIVATED CLOTTING TIME
Activated Clotting Time: 275 seconds
Activated Clotting Time: 233 seconds
Activated Clotting Time: 233 seconds
Activated Clotting Time: 245 seconds

## 2021-08-26 LAB — CREATININE, SERUM
Creatinine, Ser: 0.99 mg/dL (ref 0.44–1.00)
GFR, Estimated: >60 mL/min (ref >60)

## 2021-08-26 LAB — CBC
HCT: 39.2 % (ref 36.0–46.0)
Hemoglobin: 11.9 g/dL — ABNORMAL LOW (ref 12.0–15.0)
MCH: 26.9 pg (ref 26.0–34.0)
MCHC: 30.4 g/dL (ref 30.0–36.0)
MCV: 88.7 fL (ref 80.0–100.0)
Platelets: 236 10*3/uL (ref 150–400)
RBC: 4.42 MIL/uL (ref 3.87–5.11)
RDW: 14.4 % (ref 11.5–15.5)
WBC: 22.1 10*3/uL — ABNORMAL HIGH (ref 4.0–10.5)
nRBC: 0 % (ref 0.0–0.2)

## 2021-08-26 LAB — SURGICAL PCR SCREEN
MRSA, PCR: NEGATIVE
Staphylococcus aureus: NEGATIVE

## 2021-08-26 SURGERY — CREATION, BYPASS, ARTERIAL, AXILLARY TO BILATERAL FEMORAL, USING GRAFT
Anesthesia: General | Site: Groin | Laterality: Right

## 2021-08-26 MED ORDER — HEPARIN SODIUM (PORCINE) 1000 UNIT/ML IJ SOLN
INTRAMUSCULAR | Status: AC
  Filled 2021-08-26: qty 10

## 2021-08-26 MED ORDER — PANTOPRAZOLE SODIUM 40 MG PO TBEC
40.0000 mg | DELAYED_RELEASE_TABLET | Freq: Every day | ORAL | Status: DC
  Administered 2021-08-26 – 2021-08-29 (×4): 40 mg via ORAL
  Filled 2021-08-26 (×4): qty 1

## 2021-08-26 MED ORDER — ACETAMINOPHEN 650 MG RE SUPP: 325.0000 mg | RECTAL | Status: DC | PRN

## 2021-08-26 MED ORDER — ONDANSETRON HCL 4 MG/2ML IJ SOLN
INTRAMUSCULAR | Status: DC | PRN
  Administered 2021-08-26: 4 mg via INTRAVENOUS

## 2021-08-26 MED ORDER — MIDAZOLAM HCL 2 MG/2ML IJ SOLN
INTRAMUSCULAR | Status: DC | PRN
  Administered 2021-08-26: 2 mg via INTRAVENOUS

## 2021-08-26 MED ORDER — ONDANSETRON HCL 4 MG/2ML IJ SOLN: 4.0000 mg | Freq: Four times a day (QID) | INTRAMUSCULAR | Status: DC | PRN

## 2021-08-26 MED ORDER — DEXAMETHASONE SODIUM PHOSPHATE 10 MG/ML IJ SOLN
INTRAMUSCULAR | Status: AC
  Filled 2021-08-26: qty 1

## 2021-08-26 MED ORDER — MIDAZOLAM HCL 2 MG/2ML IJ SOLN
INTRAMUSCULAR | Status: AC
  Filled 2021-08-26: qty 2

## 2021-08-26 MED ORDER — ROSUVASTATIN CALCIUM 5 MG PO TABS
5.0000 mg | ORAL_TABLET | Freq: Every morning | ORAL | Status: DC
  Administered 2021-08-27 – 2021-08-29 (×3): 5 mg via ORAL
  Filled 2021-08-26 (×3): qty 1

## 2021-08-26 MED ORDER — 0.9 % SODIUM CHLORIDE (POUR BTL) OPTIME
TOPICAL | Status: DC | PRN
  Administered 2021-08-26: 2000 mL

## 2021-08-26 MED ORDER — LABETALOL HCL 5 MG/ML IV SOLN
INTRAVENOUS | Status: DC | PRN
  Administered 2021-08-26: 5 mg via INTRAVENOUS

## 2021-08-26 MED ORDER — OXYCODONE HCL 5 MG/5ML PO SOLN: 5.0000 mg | Freq: Once | ORAL | Status: DC | PRN

## 2021-08-26 MED ORDER — METOPROLOL TARTRATE 5 MG/5ML IV SOLN: 2.0000 mg | INTRAVENOUS | Status: DC | PRN

## 2021-08-26 MED ORDER — SODIUM CHLORIDE 0.9 % IV SOLN: INTRAVENOUS | Status: DC

## 2021-08-26 MED ORDER — CHLORHEXIDINE GLUCONATE CLOTH 2 % EX PADS: 6.0000 | MEDICATED_PAD | Freq: Once | CUTANEOUS | Status: DC

## 2021-08-26 MED ORDER — ALBUMIN HUMAN 5 % IV SOLN
12.5000 g | Freq: Once | INTRAVENOUS | Status: AC
  Administered 2021-08-26: 12.5 g via INTRAVENOUS

## 2021-08-26 MED ORDER — ROCURONIUM BROMIDE 10 MG/ML (PF) SYRINGE
PREFILLED_SYRINGE | INTRAVENOUS | Status: AC
  Filled 2021-08-26: qty 10

## 2021-08-26 MED ORDER — FENTANYL CITRATE (PF) 100 MCG/2ML IJ SOLN
INTRAMUSCULAR | Status: DC | PRN
  Administered 2021-08-26 (×4): 50 ug via INTRAVENOUS
  Administered 2021-08-26: 100 ug via INTRAVENOUS
  Administered 2021-08-26 (×4): 50 ug via INTRAVENOUS

## 2021-08-26 MED ORDER — DOCUSATE SODIUM 100 MG PO CAPS
100.0000 mg | ORAL_CAPSULE | Freq: Every day | ORAL | Status: DC
  Administered 2021-08-27 – 2021-08-28 (×2): 100 mg via ORAL
  Filled 2021-08-26 (×2): qty 1

## 2021-08-26 MED ORDER — SODIUM CHLORIDE 0.9 % IV SOLN: 500.0000 mL | Freq: Once | INTRAVENOUS | Status: DC | PRN

## 2021-08-26 MED ORDER — MUPIROCIN 2 % EX OINT: 1.0000 "application " | TOPICAL_OINTMENT | Freq: Two times a day (BID) | CUTANEOUS | Status: DC

## 2021-08-26 MED ORDER — HEMOSTATIC AGENTS (NO CHARGE) OPTIME
TOPICAL | Status: DC | PRN
  Administered 2021-08-26: 2

## 2021-08-26 MED ORDER — MORPHINE SULFATE (PF) 2 MG/ML IV SOLN
2.0000 mg | INTRAVENOUS | Status: DC | PRN
  Administered 2021-08-27: 2 mg via INTRAVENOUS
  Filled 2021-08-26: qty 1

## 2021-08-26 MED ORDER — LACTATED RINGERS IV SOLN: INTRAVENOUS | Status: DC | PRN

## 2021-08-26 MED ORDER — LIDOCAINE 2% (20 MG/ML) 5 ML SYRINGE
INTRAMUSCULAR | Status: DC | PRN
  Administered 2021-08-26: 50 mg via INTRAVENOUS

## 2021-08-26 MED ORDER — HEPARIN SODIUM (PORCINE) 5000 UNIT/ML IJ SOLN
5000.0000 [IU] | Freq: Three times a day (TID) | INTRAMUSCULAR | Status: DC
  Administered 2021-08-27 – 2021-08-29 (×5): 5000 [IU] via SUBCUTANEOUS
  Filled 2021-08-26 (×5): qty 1

## 2021-08-26 MED ORDER — HEPARIN 6000 UNIT IRRIGATION SOLUTION
Status: AC
  Filled 2021-08-26: qty 500

## 2021-08-26 MED ORDER — HEPARIN SODIUM (PORCINE) 1000 UNIT/ML IJ SOLN
INTRAMUSCULAR | Status: DC | PRN
  Administered 2021-08-26 (×2): 2000 [IU] via INTRAVENOUS
  Administered 2021-08-26: 9000 [IU] via INTRAVENOUS

## 2021-08-26 MED ORDER — SODIUM CHLORIDE 0.9 % IV SOLN: INTRAVENOUS | Status: AC

## 2021-08-26 MED ORDER — LABETALOL HCL 5 MG/ML IV SOLN
INTRAVENOUS | Status: AC
  Filled 2021-08-26: qty 4

## 2021-08-26 MED ORDER — MAGNESIUM SULFATE 2 GM/50ML IV SOLN: 2.0000 g | Freq: Every day | INTRAVENOUS | Status: DC | PRN

## 2021-08-26 MED ORDER — PROTAMINE SULFATE 10 MG/ML IV SOLN
INTRAVENOUS | Status: DC | PRN
  Administered 2021-08-26: 50 mg via INTRAVENOUS

## 2021-08-26 MED ORDER — CEFAZOLIN SODIUM-DEXTROSE 2-4 GM/100ML-% IV SOLN
2.0000 g | INTRAVENOUS | Status: AC
  Administered 2021-08-26 (×2): 2 g via INTRAVENOUS
  Filled 2021-08-26: qty 100

## 2021-08-26 MED ORDER — HEPARIN 6000 UNIT IRRIGATION SOLUTION
Status: DC | PRN
  Administered 2021-08-26: 1

## 2021-08-26 MED ORDER — PHENOL 1.4 % MT LIQD: 1.0000 | OROMUCOSAL | Status: DC | PRN

## 2021-08-26 MED ORDER — CEFAZOLIN SODIUM 1 G IJ SOLR
INTRAMUSCULAR | Status: AC
  Filled 2021-08-26: qty 20

## 2021-08-26 MED ORDER — ROCURONIUM BROMIDE 10 MG/ML (PF) SYRINGE
PREFILLED_SYRINGE | INTRAVENOUS | Status: DC | PRN
  Administered 2021-08-26: 20 mg via INTRAVENOUS
  Administered 2021-08-26: 50 mg via INTRAVENOUS
  Administered 2021-08-26 (×4): 20 mg via INTRAVENOUS

## 2021-08-26 MED ORDER — OXYCODONE-ACETAMINOPHEN 5-325 MG PO TABS
1.0000 | ORAL_TABLET | ORAL | Status: DC | PRN
  Administered 2021-08-26 – 2021-08-28 (×4): 2 via ORAL
  Filled 2021-08-26 (×4): qty 2

## 2021-08-26 MED ORDER — ACETAMINOPHEN 325 MG PO TABS
325.0000 mg | ORAL_TABLET | ORAL | Status: DC | PRN
  Administered 2021-08-28 – 2021-08-29 (×2): 650 mg via ORAL
  Filled 2021-08-26 (×2): qty 2

## 2021-08-26 MED ORDER — DEXAMETHASONE SODIUM PHOSPHATE 10 MG/ML IJ SOLN
INTRAMUSCULAR | Status: DC | PRN
  Administered 2021-08-26: 10 mg via INTRAVENOUS

## 2021-08-26 MED ORDER — LIDOCAINE 2% (20 MG/ML) 5 ML SYRINGE
INTRAMUSCULAR | Status: AC
  Filled 2021-08-26: qty 5

## 2021-08-26 MED ORDER — ALBUMIN HUMAN 5 % IV SOLN
INTRAVENOUS | Status: AC
  Filled 2021-08-26: qty 250

## 2021-08-26 MED ORDER — PROTAMINE SULFATE 10 MG/ML IV SOLN
INTRAVENOUS | Status: AC
  Filled 2021-08-26: qty 5

## 2021-08-26 MED ORDER — ORAL CARE MOUTH RINSE: 15.0000 mL | Freq: Once | OROMUCOSAL | Status: AC

## 2021-08-26 MED ORDER — GUAIFENESIN-DM 100-10 MG/5ML PO SYRP: 15.0000 mL | ORAL_SOLUTION | ORAL | Status: DC | PRN

## 2021-08-26 MED ORDER — SODIUM CHLORIDE 0.9 % IV SOLN
INTRAVENOUS | Status: DC | PRN
  Administered 2021-08-26: 500 mL

## 2021-08-26 MED ORDER — ONDANSETRON HCL 4 MG/2ML IJ SOLN
INTRAMUSCULAR | Status: AC
  Filled 2021-08-26: qty 2

## 2021-08-26 MED ORDER — CEFAZOLIN SODIUM-DEXTROSE 2-4 GM/100ML-% IV SOLN
2.0000 g | Freq: Three times a day (TID) | INTRAVENOUS | Status: AC
  Administered 2021-08-26 – 2021-08-28 (×6): 2 g via INTRAVENOUS
  Filled 2021-08-26 (×6): qty 100

## 2021-08-26 MED ORDER — THROMBIN (RECOMBINANT) 20000 UNITS EX SOLR
CUTANEOUS | Status: AC
  Filled 2021-08-26: qty 20000

## 2021-08-26 MED ORDER — LABETALOL HCL 5 MG/ML IV SOLN
10.0000 mg | INTRAVENOUS | Status: DC | PRN
  Filled 2021-08-26: qty 4

## 2021-08-26 MED ORDER — ALUM & MAG HYDROXIDE-SIMETH 200-200-20 MG/5ML PO SUSP: 15.0000 mL | ORAL | Status: DC | PRN

## 2021-08-26 MED ORDER — ONDANSETRON HCL 4 MG/2ML IJ SOLN
4.0000 mg | Freq: Four times a day (QID) | INTRAMUSCULAR | Status: DC | PRN
  Administered 2021-08-27 – 2021-08-28 (×2): 4 mg via INTRAVENOUS
  Filled 2021-08-26 (×2): qty 2

## 2021-08-26 MED ORDER — FENTANYL CITRATE (PF) 250 MCG/5ML IJ SOLN
INTRAMUSCULAR | Status: AC
  Filled 2021-08-26: qty 5

## 2021-08-26 MED ORDER — CHLORHEXIDINE GLUCONATE 0.12 % MT SOLN
15.0000 mL | Freq: Once | OROMUCOSAL | Status: AC
  Administered 2021-08-26: 15 mL via OROMUCOSAL
  Filled 2021-08-26: qty 15

## 2021-08-26 MED ORDER — FENTANYL CITRATE (PF) 100 MCG/2ML IJ SOLN: 25.0000 ug | INTRAMUSCULAR | Status: DC | PRN

## 2021-08-26 MED ORDER — LACTATED RINGERS IV SOLN: INTRAVENOUS | Status: DC

## 2021-08-26 MED ORDER — CARVEDILOL 6.25 MG PO TABS
6.2500 mg | ORAL_TABLET | Freq: Two times a day (BID) | ORAL | Status: DC
  Administered 2021-08-26 – 2021-08-29 (×6): 6.25 mg via ORAL
  Filled 2021-08-26 (×5): qty 1

## 2021-08-26 MED ORDER — HYDRALAZINE HCL 20 MG/ML IJ SOLN: 5.0000 mg | INTRAMUSCULAR | Status: DC | PRN

## 2021-08-26 MED ORDER — POTASSIUM CHLORIDE CRYS ER 20 MEQ PO TBCR: 20.0000 meq | EXTENDED_RELEASE_TABLET | Freq: Every day | ORAL | Status: DC | PRN

## 2021-08-26 MED ORDER — PHENYLEPHRINE HCL-NACL 20-0.9 MG/250ML-% IV SOLN
INTRAVENOUS | Status: DC | PRN
  Administered 2021-08-26: 20 ug/min via INTRAVENOUS

## 2021-08-26 MED ORDER — PROPOFOL 10 MG/ML IV BOLUS
INTRAVENOUS | Status: DC | PRN
  Administered 2021-08-26: 100 mg via INTRAVENOUS
  Administered 2021-08-26: 50 mg via INTRAVENOUS

## 2021-08-26 MED ORDER — SUGAMMADEX SODIUM 200 MG/2ML IV SOLN
INTRAVENOUS | Status: DC | PRN
  Administered 2021-08-26: 200 mg via INTRAVENOUS

## 2021-08-26 MED ORDER — ASPIRIN EC 81 MG PO TBEC
81.0000 mg | DELAYED_RELEASE_TABLET | Freq: Every day | ORAL | Status: DC
  Administered 2021-08-27 – 2021-08-29 (×3): 81 mg via ORAL
  Filled 2021-08-26 (×3): qty 1

## 2021-08-26 MED ORDER — PROPOFOL 10 MG/ML IV BOLUS
INTRAVENOUS | Status: AC
  Filled 2021-08-26: qty 20

## 2021-08-26 MED ORDER — OXYCODONE HCL 5 MG PO TABS: 5.0000 mg | ORAL_TABLET | Freq: Once | ORAL | Status: DC | PRN

## 2021-08-26 MED ORDER — STERILE WATER FOR IRRIGATION IR SOLN
Status: DC | PRN
  Administered 2021-08-26: 1000 mL

## 2021-08-26 MED ORDER — EPHEDRINE SULFATE-NACL 50-0.9 MG/10ML-% IV SOSY
PREFILLED_SYRINGE | INTRAVENOUS | Status: DC | PRN
  Administered 2021-08-26: 5 mg via INTRAVENOUS
  Administered 2021-08-26 (×2): 10 mg via INTRAVENOUS

## 2021-08-26 SURGICAL SUPPLY — 60 items
ADH SKN CLS APL DERMABOND .7 (GAUZE/BANDAGES/DRESSINGS) ×2
BAG COUNTER SPONGE SURGICOUNT (BAG) ×2 IMPLANT
BAG DECANTER FOR FLEXI CONT (MISCELLANEOUS) ×1 IMPLANT
BAG SPNG CNTER NS LX DISP (BAG) ×1
CANISTER SUCT 3000ML PPV (MISCELLANEOUS) ×2 IMPLANT
CANISTER WOUNDNEG PRESSURE 500 (CANNISTER) ×1 IMPLANT
CANNULA VESSEL 3MM 2 BLNT TIP (CANNULA) ×4 IMPLANT
CLIP VESOCCLUDE MED 24/CT (CLIP) ×2 IMPLANT
CLIP VESOCCLUDE SM WIDE 24/CT (CLIP) ×2 IMPLANT
CONNECTOR Y ATS VAC SYSTEM (MISCELLANEOUS) ×1 IMPLANT
COVER PROBE W GEL 5X96 (DRAPES) ×2 IMPLANT
DERMABOND ADVANCED (GAUZE/BANDAGES/DRESSINGS) ×2
DERMABOND ADVANCED .7 DNX12 (GAUZE/BANDAGES/DRESSINGS) ×1 IMPLANT
DRAPE INCISE IOBAN 66X45 STRL (DRAPES) ×3 IMPLANT
DRESSING PEEL AND PLC PRVNA 13 (GAUZE/BANDAGES/DRESSINGS) IMPLANT
DRSG PEEL AND PLACE PREVENA 13 (GAUZE/BANDAGES/DRESSINGS) ×4
ELECT REM PT RETURN 9FT ADLT (ELECTROSURGICAL) ×2
ELECTRODE REM PT RTRN 9FT ADLT (ELECTROSURGICAL) ×1 IMPLANT
GAUZE 4X4 16PLY ~~LOC~~+RFID DBL (SPONGE) ×1 IMPLANT
GLOVE BIO SURGEON STRL SZ 6.5 (GLOVE) ×2 IMPLANT
GLOVE BIO SURGEON STRL SZ7.5 (GLOVE) ×3 IMPLANT
GLOVE BIO SURGEON STRL SZ8 (GLOVE) ×2 IMPLANT
GLOVE BIOGEL PI IND STRL 6.5 (GLOVE) IMPLANT
GLOVE BIOGEL PI IND STRL 8 (GLOVE) ×1 IMPLANT
GLOVE BIOGEL PI INDICATOR 6.5 (GLOVE) ×2
GLOVE BIOGEL PI INDICATOR 8 (GLOVE) ×1
GLOVE SURG POLY ORTHO LF SZ7.5 (GLOVE) ×2 IMPLANT
GLOVE SURG UNDER LTX SZ8 (GLOVE) ×2 IMPLANT
GOWN STRL REUS W/ TWL LRG LVL3 (GOWN DISPOSABLE) ×3 IMPLANT
GOWN STRL REUS W/ TWL XL LVL3 (GOWN DISPOSABLE) IMPLANT
GOWN STRL REUS W/TWL LRG LVL3 (GOWN DISPOSABLE) ×8
GOWN STRL REUS W/TWL XL LVL3 (GOWN DISPOSABLE) ×10
GRAFT PROPATEN W/RING 6X80X60 (Vascular Products) ×1 IMPLANT
GRAFT PROPATEN W/RING 8X80X70 (Vascular Products) ×1 IMPLANT
HEMOSTAT SNOW SURGICEL 2X4 (HEMOSTASIS) ×2 IMPLANT
KIT BASIN OR (CUSTOM PROCEDURE TRAY) ×2 IMPLANT
KIT TURNOVER KIT B (KITS) ×2 IMPLANT
NS IRRIG 1000ML POUR BTL (IV SOLUTION) ×4 IMPLANT
PACK PERIPHERAL VASCULAR (CUSTOM PROCEDURE TRAY) ×2 IMPLANT
PAD ARMBOARD 7.5X6 YLW CONV (MISCELLANEOUS) ×4 IMPLANT
POUCH STERI 7.5X13 (MISCELLANEOUS) ×1 IMPLANT
PUNCH AORTIC ROTATE 4.5MM 8IN (MISCELLANEOUS) ×1 IMPLANT
SPONGE T-LAP 18X18 ~~LOC~~+RFID (SPONGE) ×4 IMPLANT
STAPLER VISISTAT 35W (STAPLE) ×1 IMPLANT
SUT MNCRL AB 4-0 PS2 18 (SUTURE) ×5 IMPLANT
SUT PROLENE 5 0 C 1 24 (SUTURE) ×5 IMPLANT
SUT PROLENE 5 0 C 1 36 (SUTURE) ×2 IMPLANT
SUT PROLENE 6 0 BV (SUTURE) ×3 IMPLANT
SUT SILK 2 0 PERMA HAND 18 BK (SUTURE) ×2 IMPLANT
SUT SILK 3 0 (SUTURE)
SUT SILK 3-0 18XBRD TIE 12 (SUTURE) IMPLANT
SUT VIC AB 2-0 CT1 27 (SUTURE) ×10
SUT VIC AB 2-0 CT1 TAPERPNT 27 (SUTURE) IMPLANT
SUT VIC AB 2-0 CTB1 (SUTURE) ×4 IMPLANT
SUT VIC AB 3-0 SH 27 (SUTURE) ×6
SUT VIC AB 3-0 SH 27X BRD (SUTURE) ×2 IMPLANT
SYR BULB IRRIG 60ML STRL (SYRINGE) ×1 IMPLANT
TOWEL GREEN STERILE (TOWEL DISPOSABLE) ×2 IMPLANT
TRAY FOLEY MTR SLVR 16FR STAT (SET/KITS/TRAYS/PACK) ×2 IMPLANT
WATER STERILE IRR 1000ML POUR (IV SOLUTION) ×2 IMPLANT

## 2021-08-26 NOTE — H&P (Signed)
[facility] [place] ? ?ASSESSMENT / PLAN: ?Cathy Jones is a 61 y.o. female with aortoiliac occlusive disease causing ischemic rest pain of bilateral lower extremities.  ? ?Recommend the following which can slow the progression of atherosclerosis and reduce the risk of major adverse cardiac / limb events:  ?Complete cessation from all tobacco products. ?Blood glucose control with goal A1c < 7%. ?Blood pressure control with goal blood pressure < 140/90 mmHg. ?Lipid reduction therapy with goal LDL-C <100 mg/dL (<70 if symptomatic from PAD).  ?Aspirin '81mg'$  PO QD.  ?Atorvastatin 40-'80mg'$  PO QD (or other "high intensity" statin therapy). ? ?Plan right axilo-bi-femoral bypass today in OR ? ? ?CHIEF COMPLAINT: bilateral leg pain ? ?HISTORY OF PRESENT ILLNESS: ?Cathy Jones is a 61 y.o. female, who is here today for evaluation.  She has a very complicated past history.  She initially was seen for severe ischemia and claudication in her right foot with Dr.Chen in 2017.  She underwent angioplasty and stenting of her left common iliac artery and planning for left to right femorofemoral bypass.  She subsequently underwent a left to right femorofemoral bypass by myself on 08/10/2015.  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  She had a pelvic abscess and is having drainage of this with a prolonged seizure and placed prone position for drainage.  She had acute thrombosis of her femorofemoral bypass at this time and underwent thrombectomy of her femorofemoral bypass on 09/05/2015.  She has done well since that time.  She has had recent recurrent difficulty regarding colonic stricture and also reaccumulation of fluid collections in her abdominal wall requiring aspiration.  Over the past several months she has had recurrent rest pain in her right foot.  She is here today for evaluation of this.  Fortunately she has no tissue  loss ? ?Past Medical History:  ?Diagnosis Date  ? Acute renal failure (ARF) ([place]) 08/07/2015  ? Allergy   ? Anemia   ? Anxiety   ? Asthma   ? pt brought her proair inhaler 06-06-14  ? Cancer [facility])   ? cervical  ? Colon polyp   ? hyperplastic  ? Colon stricture ([place])   ? Colonic obstruction ([place])   ? Depression   ? Family history of adverse reaction to anesthesia   ? pts mother experiences nausea and vomiting   ? GERD (gastroesophageal reflux disease)   ? History of bronchitis   ? History of cervical cancer   ? 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 History of chemotherapy   ? History of hiatal hernia   ? History of radiation therapy   ? Hyperlipidemia   ? Hypertension   ? Iliac artery occlusion, right ([place]) 08/02/2015  ? Migraine   ? "q couple years maybe" (08/07/2015)  ? Shortness of breath dyspnea   ? seasonal   ? ? ?Past Surgical History:  ?Procedure Laterality Date  ? ABDOMINAL AORTOGRAM W/LOWER EXTREMITY Bilateral 07/26/2021  ? Procedure: ABDOMINAL AORTOGRAM W/LOWER EXTREMITY;  Surgeon: Cherre Robins, MD;  Location: [facility];  Service: Cardiovascular;  Laterality: Bilateral;  ? COLON RESECTION N/A 06/27/2015  ? Procedure: LYSIS OF ADHESIONS LOW ANTERIOR RESECTION DIVERTING LOOP ILEOSTOMY;  Surgeon: Leighton Ruff, MD;  Location: [facility];  Service: General;  Laterality: N/A;  ? COLON SURGERY    ? COLONOSCOPY    ? CYSTOSCOPY  WITH STENT PLACEMENT Bilateral 06/27/2015  ? Procedure: CYSTOSCOPY WITH CATHETER  PLACEMENT;  Surgeon: Franchot Gallo, MD;  Location: [facility];  Service: Urology;  Laterality: Bilateral;  ? DIVERTING ILEOSTOMY N/A 06/27/2015  ? Procedure: DIVERTING LOOP ILEOSTOMY;  Surgeon: Leighton Ruff, MD;  Location: [facility];  Service: General;  Laterality: N/A;  ? FEMORAL-FEMORAL BYPASS GRAFT Bilateral 08/10/2015  ? Procedure: BYPASS GRAFT LEFT FEMORALTO RIGHT FEMORAL ARTERY;  Surgeon: Rosetta Posner, MD;  Location: [facility];  Service: Vascular;   Laterality: Bilateral;  ? FLEXIBLE SIGMOIDOSCOPY  12/12/2015  ? Procedure: FLEXIBLE SIGMOIDOSCOPY;  Surgeon: Leighton Ruff, MD;  Location: [facility];  Service: General;;  ? ILEOSTOMY CLOSURE N/A 12/12/2015  ? Procedure: OPEN ILEOSTOMY REVERSAL AND PELVIC WASHOUT;  Surgeon: Leighton Ruff, MD;  Location: [facility];  Service: General;  Laterality: N/A;  ? INTRAOPERATIVE ARTERIOGRAM Right 08/10/2015  ? Procedure: INTRA OPERATIVE ARTERIOGRAM Right Iliac Artery;  Surgeon: Rosetta Posner, MD;  Location: [facility];  Service: Vascular;  Laterality: Right;  ? IR GENERIC HISTORICAL  09/12/2015  ? IR RADIOLOGIST EVAL & MGMT 09/12/2015 Monia Sabal, PA-C [facility] INTERV RAD  ? IR RADIOLOGIST EVAL & MGMT  06/23/2018  ? IR RADIOLOGIST EVAL & MGMT  07/07/2018  ? IV antibiotic infusion therapy     ? PERIPHERAL VASCULAR CATHETERIZATION N/A 08/09/2015  ? Procedure: Abdominal Aortogram w/Lower Extremity;  Surgeon: Conrad Caldwell, MD;  Location: [facility];  Service: Cardiovascular;  Laterality: N/A;  ? PERIPHERAL VASCULAR CATHETERIZATION Left 08/09/2015  ? Procedure: Peripheral Vascular Intervention;  Surgeon: Conrad [place], MD;  Location: [facility];  Service: Cardiovascular;  Laterality: Left;  common iliac  ? PICC LINE PLACE PERIPHERAL ([place] HX)    ? RADICAL ABDOMINAL HYSTERECTOMY  1994  ? THROMBECTOMY FEMORAL ARTERY Bilateral 09/05/2015  ? Procedure: THROMBECTOMY Left to Right Femoral to  FEMORAL ARTERY Bypass,.;  Surgeon: Mal Misty, MD;  Location: [facility];  Service: Vascular;  Laterality: Bilateral;  ? THROMBECTOMY ILIAC ARTERY Right 08/10/2015  ? Procedure: THROMBECTOMY Right ILIAC ARTERY;  Surgeon: Rosetta Posner, MD;  Location: [place];  Service: Vascular;  Laterality: Right;  ? TUBAL LIGATION    ? UPPER GASTROINTESTINAL ENDOSCOPY    ? ? ?Family History  ?Problem Relation Age of Onset  ? Hypertension Mother   ? Hyperparathyroidism Mother   ? Hypertension Father   ? CAD Father   ?     CABG  ? Peripheral vascular disease Father   ?  Graves' disease Father   ? Breast cancer Maternal Aunt   ? Breast cancer Maternal Aunt   ? Prostate cancer Paternal Uncle   ? Rectal cancer Cousin   ? Colon cancer Neg Hx   ? Stomach cancer Neg Hx   ? Esophageal cancer Neg Hx   ? Deep vein thrombosis Neg Hx   ? Uterine cancer Neg Hx   ? Endometrial cancer Neg Hx   ? Pancreatic cancer Neg Hx   ? ? ?Social History  ? ?Socioeconomic History  ? Marital status: Married  ?  Spouse name: Not on file  ? Number of children: 1  ? Years of education: Not on file  ? Highest education level: Not on file  ?Occupational History  ? Not on file  ?Tobacco Use  ? Smoking status: Every Day  ?  Packs/day: 0.50  ?  Years: 45.00  ?  Pack years: 22.50  ?  Types: Cigarettes  ?  Start date: 05/17/1975  ?  Smokeless tobacco: Never  ? Tobacco comments:  ?  Less than 10 cigarettes per day  ?Vaping Use  ? Vaping Use: Former  ? Devices: e-cigrette with out nicotene for of the ostomy  ?Substance and Sexual Activity  ? Alcohol use: No  ?  Alcohol/week: 0.0 standard drinks  ? Drug use: Not Currently  ?  Types: Marijuana  ?  Comment: smoked marijuana last around 07/2021  ? Sexual activity: Not Currently  ?Other Topics Concern  ? Not on file  ?Social History Narrative  ? Not on file  ? ?Social Determinants of Health  ? ?Financial Resource Strain: Not on file  ?Food Insecurity: Not on file  ?Transportation Needs: Not on file  ?Physical Activity: Not on file  ?Stress: Not on file  ?Social Connections: Not on file  ?Intimate Partner Violence: Not on file  ? ? ?Allergies  ?Allergen Reactions  ? Codeine Other (See Comments)  ?  "hyper"  ? ? ?Current Facility-Administered Medications  ?Medication Dose Route Frequency Provider Last Rate Last Admin  ? 0.9 %  sodium chloride infusion   Intravenous Continuous Cherre Robins, MD      ? ceFAZolin (ANCEF) IVPB 2g/100 mL premix  2 g Intravenous 30 min Pre-Op Cherre Robins, MD      ? Chlorhexidine Gluconate Cloth 2 % PADS 6 each  6 each Topical Once Cherre Robins, MD      ? And  ? Chlorhexidine Gluconate Cloth 2 % PADS 6 each  6 each Topical Once Cherre Robins, MD      ? lactated ringers infusion   Intravenous Continuous Albertha Ghee, MD      ? mupirocin ointmen

## 2021-08-26 NOTE — Anesthesia Procedure Notes (Signed)
Arterial Line Insertion ?Start/End4/17/2023 7:21 AM, 08/26/2021 7:21 AM ?Performed by: Albertha Ghee, MD, Genelle Bal, CRNA, CRNA ? Patient location: Pre-op. ?Preanesthetic checklist: patient identified, IV checked, site marked, risks and benefits discussed, surgical consent, monitors and equipment checked, pre-op evaluation, timeout performed and anesthesia consent ?Lidocaine 1% used for infiltration ?Left, radial was placed ?Catheter size: 20 G ?Hand hygiene performed  and maximum sterile barriers used  ? ?Attempts: 1 ?Procedure performed without using ultrasound guided technique. ?Following insertion, dressing applied. ?Post procedure assessment: normal and unchanged ? ?Patient tolerated the procedure well with no immediate complications. ? ? ?

## 2021-08-26 NOTE — Transfer of Care (Signed)
Immediate Anesthesia Transfer of Care Note ? ?Patient: Cathy Jones ? ?Procedure(s) Performed: RIGHT AXILLO-BIFEMORAL BYPASS WITH GORETEC GRAFT (Right: Groin) ? ?Patient Location: PACU ? ?Anesthesia Type:General ? ?Level of Consciousness: awake, alert  and oriented ? ?Airway & Oxygen Therapy: Patient Spontanous Breathing and Patient connected to face mask oxygen ? ?Post-op Assessment: Report given to RN and Post -op Vital signs reviewed and stable ? ?Post vital signs: Reviewed and stable ? ?Last Vitals:  ?Vitals Value Taken Time  ?BP 136/61 08/26/21 1300  ?Temp    ?Pulse 65 08/26/21 1302  ?Resp 15 08/26/21 1302  ?SpO2 96 % 08/26/21 1302  ?Vitals shown include unvalidated device data. ? ?Last Pain:  ?Vitals:  ? 08/26/21 0636  ?TempSrc:   ?PainSc: 0-No pain  ?   ? ?  ? ?Complications: No notable events documented. ?

## 2021-08-26 NOTE — Progress Notes (Signed)
Patient admitted from PACU. Oriented to unit and staff. CCMD notified. CHG bath complete. Patient is alert and oriented x4 and VS WNL. Fuller Canada, RN ? ?

## 2021-08-26 NOTE — Anesthesia Procedure Notes (Signed)
Procedure Name: Intubation ?Date/Time: 08/26/2021 7:39 AM ?Performed by: Genelle Bal, CRNA ?Pre-anesthesia Checklist: Patient identified, Emergency Drugs available, Suction available and Patient being monitored ?Patient Re-evaluated:Patient Re-evaluated prior to induction ?Oxygen Delivery Method: Circle system utilized ?Preoxygenation: Pre-oxygenation with 100% oxygen ?Induction Type: IV induction ?Ventilation: Mask ventilation without difficulty ?Laryngoscope Size: Sabra Heck and 2 ?Grade View: Grade I ?Tube type: Oral ?Tube size: 7.0 mm ?Number of attempts: 1 ?Airway Equipment and Method: Stylet and Oral airway ?Placement Confirmation: ETT inserted through vocal cords under direct vision, positive ETCO2 and breath sounds checked- equal and bilateral ?Secured at: 21 cm ?Tube secured with: Tape ?Dental Injury: Teeth and Oropharynx as per pre-operative assessment  ? ? ? ? ?

## 2021-08-26 NOTE — Op Note (Signed)
DATE OF SERVICE: 08/26/2021 ? ?PATIENT:  Cathy Jones  61 y.o. female ? 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 ? ?POST-OPERATIVE DIAGNOSIS:  Same ? ?PROCEDURE:   ?1) Re-exposure of bilateral common femoral arteries (>90 days) ?2) Excision of previous femoral-femoral bypass ?3) Bilateral common femoral thromboendarterectomy ?4) Right axillary - bi - femoral bypass with 70m ePTFE in subcutaneous tunnel ? ?SURGEON:  Surgeon(s) and Role: ?   * HCherre Robins MD - Primary ? ?ASSISTANT: JCassandria Santee MD ? MArlee Muslim PA-C ? ?An experienced assistant was required given the complexity of this procedure and the standard of surgical care. My assistant helped with exposure through counter tension, suctioning, ligation and retraction to better visualize the surgical field.  My assistant expedited sewing during the case by following my sutures. Wherever I use the term "we" in the report, my assistant actively helped me with that portion of the procedure. ? ?ANESTHESIA:   general ? ?EBL: 3034m? ?BLOOD ADMINISTERED:none ? ?DRAINS: none  ? ?LOCAL MEDICATIONS USED:  NONE ? ?SPECIMEN:  none ? ?COUNTS: confirmed correct. ? ?TOURNIQUET:  none ? ?PATIENT DISPOSITION:  PACU - hemodynamically stable. ?  ?Delay start of Pharmacological VTE agent (>24hrs) due to surgical blood loss or risk of bleeding: no ? ?INDICATION FOR PROCEDURE: Cathy GARCILAZOs a [age].o. female with ischemic rest pain of bilateral lower extremities.  She has a personal history of gynecologic malignancy status post laparotomy with resection, periodic lymph node harvest, and external beam radiation to the abdomen and pelvis.  She also had multiple alimentary tract issues requiring percutaneous drainage.  Preoperative angiogram was performed and showed a aortic occlusion just below the takeoff of the renal arteries bilaterally.  Reconstitution  of her common femoral artery on the left and her profunda and superficial femoral artery on the right. After careful discussion of risks, benefits, and alternatives the patient was offered right axillary bifemoral bypass. The patient understood and wished to proceed. ? ?OPERATIVE FINDINGS: Uncomplicated exposure of the first portion of the right axillary artery proximal to the pectoralis minor.  Redo exposure of the common femoral arteries bilaterally with an expected level of difficulty due to scar.  Tunneling down the right flank performed with the assistance of a counterincision.  Great care was taken to allow the graft to sit without any tension in the axillary exposure to reduce the risk of axillary pullout.  All prior graft material excised except for a small cuff on the left common femoral artery.  Unremarkable right axillary to right common femoral bypass.  Right to left femoral-femoral bypass performed using the hood of the prior bypasses inflow and the cuff of existing graft as our anastomotic target.  Brisk Doppler flow in the posterior tibial arteries at completion. ? ?DESCRIPTION OF PROCEDURE: After identification of the patient in the pre-operative holding area, the patient was transferred to the operating room. The patient was positioned supine on the operating room table. Anesthesia was induced. The right chest, right flank, bilateral groins and thighs were prepped and draped in standard fashion. A surgical pause was performed confirming correct patient, procedure, and operative location. ? ?A transverse incision was made 2 cm below the midportion of the clavicle extending laterally in the right chest.  The incision was carried down through subtenons tissue until the clavipectoral fascia was identified and divided.  The pectoralis major muscle was identified.  The fibers  of the pectoralis major were atraumatically split.  The axillary vascular bundle was identified.  The axillary vein was encircled  and retracted caudally.  The axillary artery was identified behind this and exposed for several centimeters.  The artery was encircled with Silastic Vesseloops proximally and distally, a large branch was encircled with a small Silastic vessel loop. ? ?Previous longitudinal incision in the right groin was reopened sharply with a 10 blade.  Incision was carried down through subcutaneous tissue until the previous femoral-femoral bypass graft was identified.  Exposure was carried onto the bypass graft caudally to its anastomosis in the right common femoral artery.  I carefully skeletonized the common femoral artery bifurcation exposing several centimeters of the superficial femoral artery and profunda femoris artery.  Similarly the native proximal common femoral artery and external iliac artery were skeletonized carefully to allow a clamp to be placed. ? ?Previous longitudinal incision in the left groin was reopened sharply with a 10 blade.  Incision was carried down through subcutaneous tissue until the previous femoral-femoral bypass graft was identified.  Exposure was carried onto the bypass graft caudally to its anastomosis in the right common femoral artery.  I carefully skeletonized the common femoral artery bifurcation exposing several centimeters of the superficial femoral artery and profunda femoris artery.  Similarly the native proximal common femoral artery and external iliac artery were skeletonized carefully to allow a clamp to be placed. ? ?A Kelly-Wick tunneling device was brought in the field and used to create a subcutaneous tunnel between the right femoral and right axillary exposures.  A small counterincision was made in the right lateral chest to allow easier tunneling.  Great care was taken to allow the tunnel to sit in a lazy C configuration onto the first portion of the axillary artery to reduce the risk of axillary pullout.  An 8 mm externally supported PTFE vascular graft was brought in the  field and delivered through this tunnel between the 2 exposures.  The graft was delivered through the tunneling device with great care to avoid twisting or kinking the graft. ? ?Blunt digital dissection was performed over the previous femoral-femoral bypass graft.  I was able to develop a plane almost completely with digital dissection and subcutaneous position.  Ultimately a aortic cross-clamp was delivered through the tunnel.  An 8 mm externally supported PTFE vascular graft was then delivered with great care through the femoral-femoral tunnel to avoid twisting or kinking the graft. ? ?Patient was systemically heparinized.  Activated clotting time measurements were used throughout the case to confirm adequate anticoagulation. ? ?The right axillary artery was clamped proximally distally.  The large side branch was also clamped.  A longitudinal arteriotomy was made in the artery with an 11 blade and extended with small Potts scissors.  The arteriotomy was debrided with a 4 mm aortic punch.  The proximal aspect of the previously delivered vascular graft was spatulated to allow end-to-side anastomosis.  End-to-side anastomosis was then performed using continuous running suture of 5-0 Prolene.  After completion the anastomosis was flushed down the open end of the vascular graft.  Brisk flow through the graft was noted.  The graft was clamped. ? ?The right external iliac artery, profunda femoris artery, and superficial femoral artery were clamped.  The prior femoral-femoral bypass graft was debrided from the common femoral artery in its entirety.  A small cuff was left on the left common femoral artery, and the graft was transected.  No bleeding was noted from the left  common femoral stump.  A hemostat was applied to it in an abundance of caution.  The entire prior femoral-femoral bypass graft was excised from its position in the suprapubic abdomen using Bovie electrocautery.  The graft was well incorporated, but was  straightforward to expose and remove.   ? ?The right common femoral artery and its bifurcation were then thromboendarterectomized.  Minimal plaque was noted, but chronic thrombus was occluding the common femo

## 2021-08-27 ENCOUNTER — Encounter: Payer: BC Managed Care – PPO | Admitting: Vascular Surgery

## 2021-08-27 ENCOUNTER — Encounter (HOSPITAL_COMMUNITY): Payer: Self-pay | Admitting: Vascular Surgery

## 2021-08-27 LAB — BASIC METABOLIC PANEL
Anion gap: 9 (ref 5–15)
BUN: 18 mg/dL (ref 6–20)
CO2: 23 mmol/L (ref 22–32)
Calcium: 8.5 mg/dL — ABNORMAL LOW (ref 8.9–10.3)
Chloride: 99 mmol/L (ref 98–111)
Creatinine, Ser: 1.18 mg/dL — ABNORMAL HIGH (ref 0.44–1.00)
GFR, Estimated: 53 mL/min — ABNORMAL LOW (ref >60)
Glucose, Bld: 136 mg/dL — ABNORMAL HIGH (ref 70–99)
Potassium: 5.1 mmol/L (ref 3.5–5.1)
Sodium: 131 mmol/L — ABNORMAL LOW (ref 135–145)

## 2021-08-27 LAB — CBC
HCT: 32.1 % — ABNORMAL LOW (ref 36.0–46.0)
Hemoglobin: 10.5 g/dL — ABNORMAL LOW (ref 12.0–15.0)
MCH: 27.8 pg (ref 26.0–34.0)
MCHC: 32.7 g/dL (ref 30.0–36.0)
MCV: 84.9 fL (ref 80.0–100.0)
Platelets: 185 10*3/uL (ref 150–400)
RBC: 3.78 MIL/uL — ABNORMAL LOW (ref 3.87–5.11)
RDW: 14.3 % (ref 11.5–15.5)
WBC: 19.4 10*3/uL — ABNORMAL HIGH (ref 4.0–10.5)
nRBC: 0 % (ref 0.0–0.2)

## 2021-08-27 NOTE — Progress Notes (Signed)
?  Transition of Care (TOC) Screening Note ? ? ?Patient Details  ?Name: Cathy Jones ?Date of Birth: Mar 24, 1961 ? ? ?Transition of Care (TOC) CM/SW Contact:    ?Dahlia Client, Romeo Rabon, RN ?Phone Number: ?08/27/2021, 2:59 PM ? ? ? ?Transition of Care Department [facility]) has reviewed patient and no TOC needs have been identified at this time. We will continue to monitor patient advancement through interdisciplinary progression rounds. If new patient transition needs arise, please place a TOC consult. ?  ?

## 2021-08-27 NOTE — Anesthesia Postprocedure Evaluation (Signed)
Anesthesia Post Note ? ?Patient: Cathy Jones ? ?Procedure(s) Performed: RIGHT AXILLO-BIFEMORAL BYPASS WITH GORETEC GRAFT (Right: Groin) ? ?  ? ?Patient location during evaluation: PACU ?Anesthesia Type: General ?Level of consciousness: awake and alert ?Pain management: pain level controlled ?Vital Signs Assessment: post-procedure vital signs reviewed and stable ?Respiratory status: spontaneous breathing, nonlabored ventilation, respiratory function stable and patient connected to nasal cannula oxygen ?Cardiovascular status: blood pressure returned to baseline and stable ?Postop Assessment: no apparent nausea or vomiting ?Anesthetic complications: no ? ? ?No notable events documented. ? ?Last Vitals:  ?Vitals:  ? 08/27/21 0501 08/27/21 0741  ?BP: (!) 110/58 112/65  ?Pulse: 76 75  ?Resp: 14 18  ?Temp: 36.5 ?C 36.6 ?C  ?SpO2: 95% 96%  ?  ?Last Pain:  ?Vitals:  ? 08/27/21 0741  ?TempSrc: Oral  ?PainSc: 0-No pain  ? ? ?  ?  ?  ?  ?  ?  ? ?[place] ? ? ? ? ?

## 2021-08-27 NOTE — Progress Notes (Signed)
[facility] [place] ?PROGRESS NOTE ? ?ASSESSMENT / PLAN: ?Cathy Jones is a 61 y.o. female POD#1 right axilo-bi-femoral bypass. Doing well. PRN pain control. Diet as tolerated. Mobilize with PT/OT. Keep prevenas in place until discharge. May be ready for home tomorrow.   ? ?SUBJECTIVE: ?No complaints. Feet feel better.  ? ?OBJECTIVE: ?BP 112/65 (BP Location: Right Arm)   Pulse 75   Temp 97.9 ?F (36.6 ?C) (Oral)   Resp 18   Ht '5\' 2"'$  (1.575 m)   Wt 84.4 kg   SpO2 96%   BMI 34.02 kg/m?  ? ?Intake/Output Summary (Last 24 hours) at 08/27/2021 0757 ?Last data filed at 08/27/2021 0400 ?Gross per 24 hour  ?Intake 3965.97 ml  ?Output 635 ml  ?Net 3330.97 ml  ?  ?Constitutional: well appearing. no acute distress. ?Cardiac: RRR. ?Pulmonary: unlabored breathing ?Vascular: Right axillary incision healing appropriately. Prevena to bilateral groins. Well perfused feet bilaterally.  ? ? ?  Latest Ref Rng & Units 08/27/2021  ?  3:50 AM 08/26/2021  ?  1:30 PM 08/22/2021  ?  3:00 PM  ?CBC  ?WBC 4.0 - 10.5 K/uL 19.4   22.1   10.9    ?Hemoglobin 12.0 - 15.0 g/dL 10.5   11.9   14.7    ?Hematocrit 36.0 - 46.0 % 32.1   39.2   47.0    ?Platelets 150 - 400 K/uL 185   236   197    ?  ? ? ?  Latest Ref Rng & Units 08/27/2021  ?  3:50 AM 08/26/2021  ?  1:30 PM 08/22/2021  ?  3:00 PM  ?CMP  ?Glucose 70 - 99 mg/dL 136    112    ?BUN 6 - 20 mg/dL 18    12    ?Creatinine 0.44 - 1.00 mg/dL 1.18   0.99   0.93    ?Sodium 135 - 145 mmol/L 131    142    ?Potassium 3.5 - 5.1 mmol/L 5.1    4.2    ?Chloride 98 - 111 mmol/L 99    109    ?CO2 22 - 32 mmol/L 23    27    ?Calcium 8.9 - 10.3 mg/dL 8.5    9.3    ?Total Protein 6.5 - 8.1 g/dL   6.9    ?Total Bilirubin 0.3 - 1.2 mg/dL   0.1    ?Alkaline Phos 38 - 126 U/L   96    ?AST 15 - 41 U/L   10    ?ALT 0 - 44 U/L   9    ? ? ?Estimated Creatinine Clearance: 51.7 mL/min (A) (by C-G formula based on SCr of 1.18 mg/dL (H)). ? ?[name]. Stanford Breed, MD ?[facility]  [place] ?Office Phone Number: [phone] ?08/27/2021 7:57 AM ? ? ? ?

## 2021-08-27 NOTE — Progress Notes (Signed)
Mobility Specialist Progress Note ? ? 08/27/21 1612  ?Mobility  ?Activity Ambulated with assistance in hallway  ?Level of Assistance Independent after set-up  ?Assistive Device None  ?Distance Ambulated (ft) 470 ft  ?Activity Response Tolerated well  ?$Mobility charge 1 Mobility  ? ?Pre Mobility: 85 HR, 116/64 BP ?During Mobility: 133 HR ?Post Mobility: 94 HR, 153/66 BP, 98% SpO2 ? ?Received pt in bed having no complaints and agreeable. No faults and asymptomatic during ambulation. Returned back to bd w/ call bell in reach and family in room. ? ?Holland Falling ?Mobility Specialist ?Phone Number [phone] ? ?

## 2021-08-27 NOTE — Evaluation (Signed)
Occupational Therapy Evaluation ?Patient Details ?Name: Cathy Jones ?MRN: [id number] ?DOB: September 01, 1960 ?Today's Date: 08/27/2021 ? ? ?History of Present Illness Pt is a 61 y/o female who presents on 4/17 due to Rt foot pain now s/p excision of previous femoral-femoral bypass, bilateral common femoral thromboendarterectomy, R axillary bi femoral bypass. PMH: left to right femorofemoral bypass 07/2015,cervical ca with radiation therapy to colon, femorofemoral bypass 08/2015, acute renal faliure, anxiety, HTN.  ? ?Clinical Impression ?  ?Pt reported they will have spouse support with the return to home with LB ADLS as needed as required min guard to min assist due to decrease ability to complete due to incisional pain at this time. Pt was educated about AE/DME to be able to increase in independence with the return to home and voiced an understanding. Pt noted at the end of session reported feeling fatigued and requested to rest back into bed due to prior Physical Therapy session as well. Pt currently with functional limitations due to the deficits listed below (see OT Problem List).  Pt will benefit from skilled OT to increase their safety and independence with ADL and functional mobility for ADL to facilitate discharge to venue listed below.  ?  ?   ? ?Recommendations for follow up therapy are one component of a multi-disciplinary discharge planning process, led by the attending physician.  Recommendations may be updated based on patient status, additional functional criteria and insurance authorization.  ? ?Follow Up Recommendations ? No OT follow up  ?  ?Assistance Recommended at Discharge Set up Supervision/Assistance  ?Patient can return home with the following Assistance with cooking/housework;Assist for transportation;A little help with bathing/dressing/bathroom ? ?  ?Functional Status Assessment ? Patient has had a recent decline in their functional status and demonstrates the ability to make significant  improvements in function in a reasonable and predictable amount of time.  ?Equipment Recommendations ? Tub/shower bench  ?  ?Recommendations for Other Services   ? ? ?  ?Precautions / Restrictions Precautions ?Precautions: Other (comment) ?Precaution Comments: wound vac ?Restrictions ?Weight Bearing Restrictions: No  ? ?  ? ?Mobility Bed Mobility ?Overal bed mobility: Modified Independent ?Bed Mobility: Sit to Supine ?  ?  ?Supine to sit: Modified independent (Device/Increase time), HOB elevated ?  ?  ?  ?  ? ?Transfers ?Overall transfer level: Needs assistance ?Equipment used: Rolling walker (2 wheels), None ?Transfers: Sit to/from Stand ?Sit to Stand: Supervision ?  ?  ?  ?  ?  ?  ?  ? ?  ?Balance Overall balance assessment: No apparent balance deficits (not formally assessed) ?  ?  ?  ?  ?  ?  ?  ?  ?  ?  ?  ?  ?  ?  ?  ?  ?  ?  ?   ? ?ADL either performed or assessed with clinical judgement  ? ?ADL Overall ADL's : Needs assistance/impaired ?Eating/Feeding: Independent;Sitting ?  ?Grooming: Wash/dry hands;Wash/dry face;Modified independent;Cueing for safety;Cueing for sequencing;Sitting;Standing ?  ?Upper Body Bathing: Modified independent;Sitting ?  ?Lower Body Bathing: Minimal assistance;Cueing for safety;Cueing for sequencing;Sit to/from stand ?  ?Upper Body Dressing : Modified independent;Sitting;Standing ?  ?Lower Body Dressing: Minimal assistance;Cueing for safety;Cueing for sequencing;Sit to/from stand ?  ?Toilet Transfer: Modified Independent;Cueing for safety;Cueing for sequencing;Grab bars ?  ?Toileting- Clothing Manipulation and Hygiene: Modified independent;Sit to/from stand ?  ?  ?  ?Functional mobility during ADLs: Modified independent ?   ? ? ? ?Vision Baseline Vision/History: 1 Wears glasses ?Ability  to See in Adequate Light: 0 Adequate ?   ?   ?Perception   ?  ?Praxis   ?  ? ?Pertinent Vitals/Pain Pain Assessment ?Pain Assessment: Faces ?Faces Pain Scale: Hurts a little bit ?Pain Location:  groin ?Pain Descriptors / Indicators: Aching ?Pain Intervention(s): Limited activity within patient's tolerance, Monitored during session, Repositioned  ? ? ? ?Hand Dominance Right ?  ?Extremity/Trunk Assessment Upper Extremity Assessment ?Upper Extremity Assessment: Overall WFL for tasks assessed ?  ?Lower Extremity Assessment ?Lower Extremity Assessment: Defer to PT evaluation ?  ?Cervical / Trunk Assessment ?Cervical / Trunk Assessment: Normal ?  ?Communication Communication ?Communication: No difficulties ?  ?Cognition Arousal/Alertness: Awake/alert ?Behavior During Therapy: [facility] for tasks assessed/performed ?Overall Cognitive Status: Within Functional Limits for tasks assessed ?  ?  ?  ?  ?  ?  ?  ?  ?  ?  ?  ?  ?  ?  ?  ?  ?  ?  ?  ?General Comments  VSS on RA. ? ?  ?Exercises   ?  ?Shoulder Instructions    ? ? ?Home Living Family/patient expects to be discharged to:: Private residence ?Living Arrangements: Spouse/significant other ?Available Help at Discharge: Family;Available PRN/intermittently ?Type of Home: House ?Home Access: Stairs to enter ?Entrance Stairs-Number of Steps: 3 ?Entrance Stairs-Rails: None ?Home Layout: One level ?  ?  ?Bathroom Shower/Tub: Tub/shower unit;Curtain ?  ?Bathroom Toilet: Standard ?  ?  ?Home Equipment: Cane - single Barista (2 wheels);Adaptive equipment ?Adaptive Equipment: [place]acher ?  ?  ? ?  ?Prior Functioning/Environment Prior Level of Function : Independent/Modified Independent ?  ?  ?  ?  ?  ?  ?Mobility Comments: Works as a Network engineer, drives. Spouse does IADLs. ?ADLs Comments: INdependent ?  ? ?  ?  ?OT Problem List: Decreased activity tolerance;Pain;Cardiopulmonary status limiting activity ?  ?   ?OT Treatment/Interventions: Self-care/ADL training;DME and/or AE instruction;Therapeutic activities;Patient/family education;Balance training  ?  ?OT Goals(Current goals can be found in the care plan section) Acute Rehab OT Goals ?Patient Stated Goal: to get back  to work ?OT Goal Formulation: With patient ?Time For Goal Achievement: 09/10/21 ?Potential to Achieve Goals: Good ?ADL Goals ?Pt Will Perform Lower Body Bathing: with modified independence;sit to/from stand ?Pt Will Perform Lower Body Dressing: with modified independence;sit to/from stand ?Additional ADL Goal #1: Pt will tolerate 15 mins of stadning ADLS with no SOB or LOB with tasks with the lest restrictive device  ?OT Frequency: Min 2X/week ?  ? ?Co-evaluation   ?  ?  ?  ?  ? ?  ?AM-PAC OT "6 Clicks" Daily Activity     ?Outcome Measure Help from another person eating meals?: None ?Help from another person taking care of personal grooming?: None ?Help from another person toileting, which includes using toliet, bedpan, or urinal?: A Little ?Help from another person bathing (including washing, rinsing, drying)?: A Little ?Help from another person to put on and taking off regular upper body clothing?: None ?Help from another person to put on and taking off regular lower body clothing?: A Little ?6 Click Score: 21 ?  ?End of Session Equipment Utilized During Treatment: Gait belt;Rolling walker (2 wheels) ? ?Activity Tolerance: Patient limited by fatigue ?Patient left: in bed;with call bell/phone within reach ? ?OT Visit Diagnosis: Muscle weakness (generalized) (M62.81);Pain ?Pain - Right/Left: Right ?Pain - part of body:  (groin)  ?              ?Time: 1610-9604 ?OT  Time Calculation (min): 23 min ?Charges:  OT General Charges ?$OT Visit: 1 Visit ?OT Evaluation ?$OT Eval Low Complexity: 1 Low ?OT Treatments ?$Self Care/Home Management : 8-22 mins ? ?Joeseph Amor OTR/L  ?Acute Rehab Services  ?[phone] office number ?[phone] pager number ? ? ?Joeseph Amor ?08/27/2021, 11:30 AM ?

## 2021-08-27 NOTE — Progress Notes (Addendum)
Pt c/o increased swelling and tenderness R lower ABD.   ?Noted below R lateral chest incision to lower ABD firmness & discoloration stretching to near midline posterior.  Firmness marked ?Vital signs monitoring continues ?MD paged & notified. Continue to monitor for changes.   ?

## 2021-08-27 NOTE — Evaluation (Signed)
Physical Therapy Evaluation ?Patient Details ?Name: Cathy Jones ?MRN: [id number] ?DOB: Jul 04, 1960 ?Today's Date: 08/27/2021 ? ?History of Present Illness ? Pt is a 61 y/o female who presents on 4/17 due to Rt foot pain now s/p excision of previous femoral-femoral bypass, bilateral common femoral thromboendarterectomy, R axillary bi femoral bypass. PMH: left to right femorofemoral bypass 07/2015,cervical ca with radiation therapy to colon, femorofemoral bypass 08/2015, acute renal faliure, anxiety, HTN.  ?Clinical Impression ? Patient presents with mild balance deficits and post surgical deficits s/p above surgery. Pt lives at home with spouse, works and is Mod I for ADLs PTA. Pt has been using a SPC as needed due to pain and drives. Today, pt tolerated transfers and gait training with supervision for safety. Able to ambulate without use of DME but noted to have widened BoS and only fair balance. Pt reports no pain this session due to being premedicated with morphine. Encouraged walking 2-3 times daily. Likely will progress well and can sign off next session pending progress. Will follow acutely for higher level balance, stair training and to maximize independence prior to return home. ?   ? ?Recommendations for follow up therapy are one component of a multi-disciplinary discharge planning process, led by the attending physician.  Recommendations may be updated based on patient status, additional functional criteria and insurance authorization. ? ?Follow Up Recommendations No PT follow up ? ?  ?Assistance Recommended at Discharge PRN  ?Patient can return home with the following ? Assistance with cooking/housework;A little help with bathing/dressing/bathroom ? ?  ?Equipment Recommendations None recommended by PT  ?Recommendations for Other Services ?    ?  ?Functional Status Assessment Patient has had a recent decline in their functional status and demonstrates the ability to make significant improvements in function in  a reasonable and predictable amount of time.  ? ?  ?Precautions / Restrictions Precautions ?Precautions: Other (comment) ?Precaution Comments: wound vac ?Restrictions ?Weight Bearing Restrictions: No  ? ?  ? ?Mobility ? Bed Mobility ?Overal bed mobility: Needs Assistance ?Bed Mobility: Supine to Sit ?  ?  ?Supine to sit: Supervision, HOB elevated ?  ?  ?General bed mobility comments: Supervision for safety. Use of rails. ?  ? ?Transfers ?Overall transfer level: Needs assistance ?Equipment used: Rolling walker (2 wheels) ?Transfers: Sit to/from Stand ?Sit to Stand: Supervision ?  ?  ?  ?  ?  ?General transfer comment: Supervision for safety. Stood from [employer], transferred to chair post ambulation. ?  ? ?Ambulation/Gait ?Ambulation/Gait assistance: Supervision ?Gait Distance (Feet): 400 Feet ?Assistive device: Rolling walker (2 wheels), None ?Gait Pattern/deviations: Step-through pattern, Decreased stride length, Wide base of support ?  ?Gait velocity interpretation: 1.31 - 2.62 ft/sec, indicative of limited community ambulator ?  ?General Gait Details: Steady gait with use of RW, walked without RW as well, noted to have increased BoS ("i feel like a weeble wooble") with fair balance. VSS on RA. ? ?Stairs ?  ?  ?  ?  ?  ? ?Wheelchair Mobility ?  ? ?Modified Rankin (Stroke Patients Only) ?  ? ?  ? ?Balance Overall balance assessment: No apparent balance deficits (not formally assessed) ?  ?  ?  ?  ?  ?  ?  ?  ?  ?  ?  ?  ?  ?  ?  ?  ?  ?  ?   ? ? ? ?Pertinent Vitals/Pain Pain Assessment ?Pain Assessment: No/denies pain  ? ? ?Home Living Family/patient expects to  be discharged to:: Private residence ?Living Arrangements: Spouse/significant other ?Available Help at Discharge: Family;Available PRN/intermittently ?Type of Home: House ?Home Access: Stairs to enter ?Entrance Stairs-Rails: None ?Entrance Stairs-Number of Steps: 3 ?  ?Home Layout: One level ?Home Equipment: Cane - single Barista (2 wheels) ?   ?   ?Prior Function Prior Level of Function : Independent/Modified Independent ?  ?  ?  ?  ?  ?  ?Mobility Comments: Works as a Network engineer, drives. Spouse does IADLs. ?ADLs Comments: INdependent ?  ? ? ?Hand Dominance  ? Dominant Hand: Right ? ?  ?Extremity/Trunk Assessment  ? Upper Extremity Assessment ?Upper Extremity Assessment: Defer to OT evaluation ?  ? ?Lower Extremity Assessment ?Lower Extremity Assessment: Overall WFL for tasks assessed ?  ? ?Cervical / Trunk Assessment ?Cervical / Trunk Assessment: Normal  ?Communication  ? Communication: No difficulties  ?Cognition Arousal/Alertness: Awake/alert ?Behavior During Therapy: [facility] for tasks assessed/performed ?Overall Cognitive Status: Within Functional Limits for tasks assessed ?  ?  ?  ?  ?  ?  ?  ?  ?  ?  ?  ?  ?  ?  ?  ?  ?  ?  ?  ? ?  ?General Comments General comments (skin integrity, edema, etc.): VSS on RA. ? ?  ?Exercises    ? ?Assessment/Plan  ?  ?PT Assessment Patient needs continued PT services  ?PT Problem List Decreased mobility;Decreased skin integrity;Decreased balance ? ?   ?  ?PT Treatment Interventions Therapeutic exercise;Stair training;Gait training;Balance training;Therapeutic activities;Patient/family education   ? ?PT Goals (Current goals can be found in the Care Plan section)  ?Acute Rehab PT Goals ?Patient Stated Goal: to go home ?PT Goal Formulation: With patient ?Time For Goal Achievement: 09/10/21 ?Potential to Achieve Goals: Good ? ?  ?Frequency Min 3X/week ?  ? ? ?Co-evaluation   ?  ?  ?  ?  ? ? ?  ?AM-PAC PT "6 Clicks" Mobility  ?Outcome Measure Help needed turning from your back to your side while in a flat bed without using bedrails?: None ?Help needed moving from lying on your back to sitting on the side of a flat bed without using bedrails?: None ?Help needed moving to and from a bed to a chair (including a wheelchair)?: A Little ?Help needed standing up from a chair using your arms (e.g., wheelchair or bedside chair)?: A  Little ?Help needed to walk in hospital room?: A Little ?Help needed climbing 3-5 steps with a railing? : A Little ?6 Click Score: 20 ? ?  ?End of Session   ?Activity Tolerance: Patient tolerated treatment well ?Patient left: in chair;with call bell/phone within reach ?Nurse Communication: Mobility status ?PT Visit Diagnosis: Difficulty in walking, not elsewhere classified (R26.2) ?  ? ?Time: 6004-5997 ?PT Time Calculation (min) (ACUTE ONLY): 19 min ? ? ?Charges:   PT Evaluation ?$PT Eval Moderate Complexity: 1 Mod ?  ?  ?   ? ? ?Marisa Severin, PT, DPT ?Acute Rehabilitation Services ?Secure chat preferred ?Office [phone] ? ? ? ? ?[facility] ?08/27/2021, 10:18 AM ? ?

## 2021-08-28 LAB — BASIC METABOLIC PANEL
Anion gap: 7 (ref 5–15)
BUN: 16 mg/dL (ref 6–20)
CO2: 25 mmol/L (ref 22–32)
Calcium: 8.6 mg/dL — ABNORMAL LOW (ref 8.9–10.3)
Chloride: 99 mmol/L (ref 98–111)
Creatinine, Ser: 1 mg/dL (ref 0.44–1.00)
GFR, Estimated: >60 mL/min (ref >60)
Glucose, Bld: 147 mg/dL — ABNORMAL HIGH (ref 70–99)
Potassium: 4.8 mmol/L (ref 3.5–5.1)
Sodium: 131 mmol/L — ABNORMAL LOW (ref 135–145)

## 2021-08-28 LAB — CBC
HCT: 32.1 % — ABNORMAL LOW (ref 36.0–46.0)
Hemoglobin: 10 g/dL — ABNORMAL LOW (ref 12.0–15.0)
MCH: 26.9 pg (ref 26.0–34.0)
MCHC: 31.2 g/dL (ref 30.0–36.0)
MCV: 86.3 fL (ref 80.0–100.0)
Platelets: 156 10*3/uL (ref 150–400)
RBC: 3.72 MIL/uL — ABNORMAL LOW (ref 3.87–5.11)
RDW: 14.1 % (ref 11.5–15.5)
WBC: 15.3 10*3/uL — ABNORMAL HIGH (ref 4.0–10.5)
nRBC: 0 % (ref 0.0–0.2)

## 2021-08-28 NOTE — Progress Notes (Addendum)
?  Progress Note ? ? ? ?08/28/2021 ?8:50 AM ?2 Days Post-Op ? ?Subjective:  sitting up in chair. Says yesterday afternoon and overnight little more rough. Nauseated this morning ? ? ?Vitals:  ? 08/28/21 0412 08/28/21 0736  ?BP: (!) 90/46   ?Pulse: 71 84  ?Resp: 14   ?Temp: 98.2 ?F (36.8 ?C) 97.6 ?F (36.4 ?C)  ?SpO2: 93%   ? ?Physical Exam: ?Cardiac:  regular ?Lungs:  non labored ?Incisions:  Right axillary incision is intact and well appearing. Ecchymosis present. Prevena vac to bilateral groins. No output ?Extremities:  BLE well perfused and warm. Doppler DP/ PT signals bilaterally ?Abdomen:  Hematoma in RLQ and along tunneling on right flank. Marked and appears stable ?Neurologic: alert and oriented ? ?CBC ?   ?Component Value Date/Time  ? WBC 19.4 (H) 08/27/2021 0350  ? RBC 3.78 (L) 08/27/2021 0350  ? HGB 10.5 (L) 08/27/2021 0350  ? HCT 32.1 (L) 08/27/2021 0350  ? PLT 185 08/27/2021 0350  ? MCV 84.9 08/27/2021 0350  ? MCH 27.8 08/27/2021 0350  ? MCHC 32.7 08/27/2021 0350  ? RDW 14.3 08/27/2021 0350  ? LYMPHSABS 1.8 12/10/2015 1200  ? MONOABS 0.4 12/10/2015 1200  ? EOSABS 0.2 12/10/2015 1200  ? BASOSABS 0.0 12/10/2015 1200  ? ? ?BMET ?   ?Component Value Date/Time  ? NA 131 (L) 08/27/2021 0350  ? K 5.1 08/27/2021 0350  ? CL 99 08/27/2021 0350  ? CO2 23 08/27/2021 0350  ? GLUCOSE 136 (H) 08/27/2021 0350  ? BUN 18 08/27/2021 0350  ? CREATININE 1.18 (H) 08/27/2021 0350  ? CALCIUM 8.5 (L) 08/27/2021 0350  ? GFRNONAA 53 (L) 08/27/2021 0350  ? GFRAA >60 06/10/2018 0439  ? ? ?INR ?   ?Component Value Date/Time  ? INR 1.0 08/22/2021 1500  ? ? ? ?Intake/Output Summary (Last 24 hours) at 08/28/2021 0850 ?Last data filed at 08/27/2021 2001 ?Gross per 24 hour  ?Intake 480 ml  ?Output 0 ml  ?Net 480 ml  ? ? ? ?Assessment/Plan:  61 y.o. female is s/p right axilo-bi-femoral bypass  2 Days Post-Op  ? ?Overall doing well. Some increased pain overnight ?PRN pain control ?Some nausea this morning otherwise has been tolerating  diet ?Hematoma along tunneling on right flank. Appears stable ?Morning labs pending ?Cleared by PT/OT ?Will mobilize more today ?Continue Aspirin and Statin ?Possible discharge later this afternoon if she feels ready ?She will have follow up arranged in our office in 2 weeks for incision check ? ? ?DVT prophylaxis:  sq heparin ? ? ?Karoline Caldwell, PA-C ?[facility] ?[phone] ?08/28/2021 ?8:50 AM ? ?VASCULAR STAFF ADDENDUM: ?I have independently interviewed and examined the patient. ?I agree with the above.  ?Doing well today. ?Ecchymosis about tunnel and right axillary incision. Tender about these. ?Brisk doppler flow in the feet. ?Doing great mobilizing.  ?Anticipate discharge in next 48 hours. ? ?[name]. Stanford Breed, MD ?[facility] [place] ?Office Phone Number: [phone] ?08/28/2021 4:01 PM ? ? ?

## 2021-08-28 NOTE — Progress Notes (Signed)
Physical Therapy Treatment ?Patient Details ?Name: Cathy Jones ?MRN: [id number] ?DOB: 02/17/1961 ?Today's Date: 08/28/2021 ? ? ?History of Present Illness Pt is a 61 y/o female who presents on 4/17 due to Rt foot pain now s/p excision of previous femoral-femoral bypass, bilateral common femoral thromboendarterectomy, R axillary bi femoral bypass. PMH: left to right femorofemoral bypass 07/2015,cervical ca with radiation therapy to colon, femorofemoral bypass 08/2015, acute renal faliure, anxiety, HTN. ? ?  ?PT Comments  ? ? Pt received in recliner, agreeable to therapy session and with good participation and tolerance for gait and stair instruction and balance training. Pt scored 20/24 on Dynamic Gait Index, indicating low fall risk and reports she feels at her functional baseline aside from discomfort from surgical incision/wound vac insertion site. Pt able to perform all tasks with Supervision at most, not needing physical assist with balance challenges and no overt loss of balance. Pt continues to benefit from PT services to progress toward functional mobility goals. Anticipate pt safe to DC home once medically cleared without PT follow-up.  ?Recommendations for follow up therapy are one component of a multi-disciplinary discharge planning process, led by the attending physician.  Recommendations may be updated based on patient status, additional functional criteria and insurance authorization. ? ?Follow Up Recommendations ? No PT follow up ?  ?  ?Assistance Recommended at Discharge PRN  ?Patient can return home with the following Assistance with cooking/housework;A little help with bathing/dressing/bathroom ?  ?Equipment Recommendations ? None recommended by PT  ?  ?Recommendations for Other Services   ? ? ?  ?Precautions / Restrictions Precautions ?Precautions: Other (comment) ?Precaution Comments: wound vac ?Restrictions ?Weight Bearing Restrictions: No  ?  ? ?Mobility ? Bed Mobility ?  ?  ?  ?  ?  ?  ?   ?General bed mobility comments: received/remained in recliner ?  ? ?Transfers ?Overall transfer level: Modified independent ?Equipment used: None ?Transfers: Sit to/from Stand ?Sit to Stand: Modified independent (Device/Increase time) ?  ?  ?  ?  ?  ?General transfer comment: to/from recliner and toilet surfaces ?  ? ?Ambulation/Gait ?Ambulation/Gait assistance: Supervision ?Gait Distance (Feet): 300 Feet ?Assistive device: None ?Gait Pattern/deviations: Step-through pattern, Decreased stride length, Wide base of support ?Gait velocity: decreased ?  ?  ?General Gait Details: Steady gait with no AD, noted to have increased BoS ("i feel like a weeble wooble, I've always walked like this") with good balance performing DGI tasks; therapist assisting with IV pole/lines and pt not neeidng to hold onto anything for balance even with DGI tasks. VSS on RA. ? ? ?Stairs ?Stairs: Yes ?Stairs assistance: Supervision ?Stair Management: One rail Right, Step to pattern, Forwards ?Number of Stairs: 4 ?General stair comments: Pt ascended/descended 2 steps x2 reps (due to IV line limitations, 4 total steps) per home setup with R railing "pole" support as she does at home, min cues for sequencing for decreased pain with good carryover. ? ? ?Wheelchair Mobility ?  ? ?Modified Rankin (Stroke Patients Only) ?  ? ? ?  ?Balance Overall balance assessment: No apparent balance deficits (not formally assessed) ?  ?  ?  ?  ?  ?  ?  ?  ?  ?  ?  ?  ?  ?  ?  ?Standardized Balance Assessment ?Standardized Balance Assessment : Dynamic Gait Index ?  ?Dynamic Gait Index ?Level Surface: Mild Impairment ?Change in Gait Speed: Mild Impairment ?Gait with Horizontal Head Turns: Normal ?Gait with Vertical Head Turns: Normal ?Gait  and Pivot Turn: Normal ?Step Over Obstacle: Normal ?Step Around Obstacles: Normal ?Steps: Moderate Impairment ?Total Score: 20 ?  ? ?  ?Cognition Arousal/Alertness: Awake/alert ?Behavior During Therapy: [facility] for tasks  assessed/performed ?Overall Cognitive Status: Within Functional Limits for tasks assessed ?  ?  ?  ?  ?  ?  ?  ?  ?  ?  ?  ?  ?  ?  ?  ?  ?General Comments: good safety awareness, anticipate pt is at functional/cognitive baseline ?  ?  ? ?  ?Exercises   ? ?  ?General Comments General comments (skin integrity, edema, etc.): VSS on RA throughout (HR to 111 bpm with exertion) ?  ?  ? ?Pertinent Vitals/Pain Pain Assessment ?Pain Assessment: Faces ?Faces Pain Scale: Hurts little more ?Pain Location: R flank/wound vac insertion area ?Pain Descriptors / Indicators: Aching, Discomfort ?Pain Intervention(s): Monitored during session, Repositioned, Ice applied  ? ? ? ?PT Goals (current goals can now be found in the care plan section) Acute Rehab PT Goals ?Patient Stated Goal: to go home ?PT Goal Formulation: With patient ?Time For Goal Achievement: 09/10/21 ?Progress towards PT goals: Progressing toward goals ? ?  ?Frequency ? ? ? Min 3X/week ? ? ? ?  ?PT Plan Current plan remains appropriate  ? ? ?   ?AM-PAC PT "6 Clicks" Mobility   ?Outcome Measure ? Help needed turning from your back to your side while in a flat bed without using bedrails?: None ?Help needed moving from lying on your back to sitting on the side of a flat bed without using bedrails?: None ?Help needed moving to and from a bed to a chair (including a wheelchair)?: None ?Help needed standing up from a chair using your arms (e.g., wheelchair or bedside chair)?: None ?Help needed to walk in hospital room?: A Little ?Help needed climbing 3-5 steps with a railing? : A Little ?6 Click Score: 22 ? ?  ?End of Session Equipment Utilized During Treatment: Gait belt ?Activity Tolerance: Patient tolerated treatment well ?Patient left: in chair;with call bell/phone within reach;with family/visitor present (spouse present) ?Nurse Communication: Mobility status ?PT Visit Diagnosis: Difficulty in walking, not elsewhere classified (R26.2) ?  ? ? ?Time: 1206-1230 ?PT Time  Calculation (min) (ACUTE ONLY): 24 min ? ?Charges:  $Gait Training: 8-22 mins ?$Therapeutic Activity: 8-22 mins          ?          ? ?Shatha Hooser P., PTA ?Acute Rehabilitation Services ?Secure Chat Preferred 9a-5:30pm ?Office: [phone]  ? ? ?Elwyn Klosinski M Mckennah Kretchmer ?08/28/2021, 3:02 PM ? ?

## 2021-08-28 NOTE — Progress Notes (Signed)
Mobility Specialist Progress Note ? ? 08/28/21 1720  ?Mobility  ?Activity Ambulated independently in hallway  ?Level of Assistance Independent after set-up  ?Assistive Device None  ?Distance Ambulated (ft) 470 ft  ?Activity Response Tolerated well  ?$Mobility charge 1 Mobility  ? ?Received pt in bed having no complaints and agreeable to mobility. X1 LOB d/t distraction but quickly corrected by pt, returned back to bed w/ call bell in reach and all needs met. ? ?Holland Falling ?Mobility Specialist ?Phone Number [phone] ? ?

## 2021-08-29 MED ORDER — OXYCODONE-ACETAMINOPHEN 5-325 MG PO TABS: 1.0000 | ORAL_TABLET | Freq: Four times a day (QID) | ORAL | 0 refills | Status: DC | PRN

## 2021-08-29 NOTE — TOC Transition Note (Signed)
Transition of Care (TOC) - CM/SW Discharge Note ?Marvetta Gibbons Therapist, sports, BSN ?Transitions of Care ?Unit 4E- RN Case Manager ?See Treatment Team for direct phone #  ? ? ?Patient Details  ?Name: [name] ?MRN: [id number] ?Date of Birth: 09/17/1960 ? ?Transition of Care (TOC) CM/SW Contact:  ?Dahlia Client, Romeo Rabon, RN ?Phone Number: ?08/29/2021, 10:53 AM ? ? ?Clinical Narrative:    ?Pt stable for transition home. From home w/ spouse. Note MD has removed bil. Prevena wound VACs this am. Transition of Care Department [facility]) has reviewed patient and no TOC needs have been identified at this time.  ? ?Final next level of care: Home/Self Care ?Barriers to Discharge: No Barriers Identified ? ? ?Patient Goals and CMS Choice ?Patient states their goals for this hospitalization and ongoing recovery are:: return home ?  ?Choice offered to / list presented to : NA ? ?Discharge Placement ?  ?           ? Home ?  ?  ?  ? ?Discharge Plan and Services ?  ?  ?Post Acute Care Choice: NA          ?DME Arranged: N/A ?DME Agency: NA ?  ?  ?  ?HH Arranged: NA ?[place] Agency: NA ?  ?  ?  ? ?Social Determinants of Health (SDOH) Interventions ?  ? ? ?Readmission Risk Interventions ? ?  08/29/2021  ? 10:53 AM  ?Readmission Risk Prevention Plan  ?Post Dischage Appt Complete  ?Medication Screening Complete  ?Transportation Screening Complete  ? ? ? ? ? ?

## 2021-08-29 NOTE — Progress Notes (Signed)
PT Cancellation Note ? ?Patient Details ?Name: Cathy Jones ?MRN: [id number] ?DOB: 02/18/1961 ? ? ?Cancelled Treatment:    Reason Eval/Treat Not Completed: (P) Patient declined, no reason specified ?Pt reports no mobility concerns and preparing for DC, defers PT session this AM. ? ?Kara Pacer Breckyn Ticas ?08/29/2021, 9:54 AM ? ? ?

## 2021-08-29 NOTE — Progress Notes (Signed)
[facility]  ?PROGRESS NOTE ? ?ASSESSMENT / PLAN: ?Cathy Jones is a 61 y.o. female s/p R axilo-bi-femoral bypass graft. Ready for discharge today. Removed prevena dressings. Instructed in wound care. Follow up with me in 2-3 weeks for incision check and ABI.  ? ?SUBJECTIVE: ?No complaints. Up in chair. Wants to go home.  ? ?OBJECTIVE: ?BP 117/65 (BP Location: Right Arm)   Pulse 86   Temp 98.4 ?F (36.9 ?C) (Oral)   Resp 17   Ht '5\' 2"'$  (1.575 m)   Wt 84.4 kg   SpO2 99%   BMI 34.02 kg/m?  ? ?Intake/Output Summary (Last 24 hours) at 08/29/2021 1031 ?Last data filed at 08/29/2021 0818 ?Gross per 24 hour  ?Intake 360 ml  ?Output 0 ml  ?Net 360 ml  ?  ?Constitutional: well appearing. no acute distress. ?Cardiac: RRR. ?Pulmonary: unlabored ?Abdomen: soft ?Vascular: brisk doppler flow in feet. Incisions healthy. Unchanged ecchymosis about right flank ? ? ?  Latest Ref Rng & Units 08/28/2021  ? 11:10 AM 08/27/2021  ?  3:50 AM 08/26/2021  ?  1:30 PM  ?CBC  ?WBC 4.0 - 10.5 K/uL 15.3   19.4   22.1    ?Hemoglobin 12.0 - 15.0 g/dL 10.0   10.5   11.9    ?Hematocrit 36.0 - 46.0 % 32.1   32.1   39.2    ?Platelets 150 - 400 K/uL 156   185   236    ?  ? ? ?  Latest Ref Rng & Units 08/28/2021  ? 11:10 AM 08/27/2021  ?  3:50 AM 08/26/2021  ?  1:30 PM  ?CMP  ?Glucose 70 - 99 mg/dL 147   136     ?BUN 6 - 20 mg/dL 16   18     ?Creatinine 0.44 - 1.00 mg/dL 1.00   1.18   0.99    ?Sodium 135 - 145 mmol/L 131   131     ?Potassium 3.5 - 5.1 mmol/L 4.8   5.1     ?Chloride 98 - 111 mmol/L 99   99     ?CO2 22 - 32 mmol/L 25   23     ?Calcium 8.9 - 10.3 mg/dL 8.6   8.5     ? ? ?Estimated Creatinine Clearance: 61 mL/min (by C-G formula based on SCr of 1 mg/dL). ? ?Cathy Jones. Stanford Breed, MD ?[facility] [place] ?Office Phone Number: [phone] ?08/29/2021 10:31 AM ? ? ? ?

## 2021-08-29 NOTE — Discharge Summary (Signed)
?Bypass Discharge Summary ?Patient ID: ?[name] ?[id number] ?61 y.o. ?01-Jan-1961 ? ?Admit date: 08/26/2021 ? ?Discharge date and time: 08/29/2021 12:25 PM  ? ?Admitting Physician: Cherre Robins, MD  ? ?Discharge Physician: Jamelle Haring, MD ? ?Admission Diagnoses: PAD (peripheral artery disease) ([place]) [I73.9] ? ?Discharge Diagnoses: PAD ? ?Admission Condition: fair ? ?Discharged Condition: good ? ?Indication for Admission: Cathy Jones is a 61 y.o. female with ischemic rest pain of bilateral lower extremities.  She has a personal history of gynecologic malignancy status post laparotomy with resection, periodic lymph node harvest, and external beam radiation to the abdomen and pelvis.  She also had multiple alimentary tract issues requiring percutaneous drainage.  Preoperative angiogram was performed and showed a aortic occlusion just below the takeoff of the renal arteries bilaterally.  Reconstitution of her common femoral artery on the left and her profunda and superficial femoral artery on the right. After careful discussion of risks, benefits, and alternatives the patient was offered right axillary bifemoral bypass. The patient understood and wished to proceed. ? ?Hospital Course:  Ms.Wilmott was admitted on 4/17 and underwent  ?1) Re-exposure of bilateral common femoral arteries (>90 days) ?2) Excision of previous femoral-femoral bypass ?3) Bilateral common femoral thromboendarterectomy ?4) Right axillary - bi - femoral bypass with 25m ePTFE in subcutaneous tunnel by Dr. HStanford Breed She tolerated the surgery well and was taken to the recovery room in stable condition. ? ?By POD #1, she overall was doing very well. Pain well controlled. Incisions intact and well appearing. Prevena VACs to bilateral groins with good suction. Extremities well perfused and warm with brisk doppler signals. Tolerating diet. Hemodynamically stable. Cleared by PT and OT. Mobilized with mobility tech ? ?POD#2, some overnight nausea.  Hematoma along tunnel from axilla to right femoral incision. Soft. Non expanding. Extremities remained well perfused. Prevena VACs in place with good suction. Again mobilizing with mobility technician was well tolerated ? ?By POD#3 she remained stable for discharge. Hematoma stable. Hemodynamically stable. Pain well controlled. Extremities well perfused with brisk doppler signals. Prevena VACs removed from bilateral groins. Dry Gauze dressings applied. Wound care instructions given to patient. She will discharge home on Aspirin and Statin. PDMP was reviewed and post operative pain medication was sent to patients pharmacy. Follow up arranged in our office in 2 weeks for incision check. ? ?Consults: None ? ?Treatments: antibiotics: Bactrim, gentamycin and Cefazolin, analgesia: acetaminophen, Vicodin, and Morphine, therapies: PT and OT, and surgery: 1) Re-exposure of bilateral common femoral arteries (>90 days) ?2) Excision of previous femoral-femoral bypass ?3) Bilateral common femoral thromboendarterectomy ?4) Right axillary - bi - femoral bypass with 822mePTFE in subcutaneous tunnel ? ? ?Disposition: Discharge disposition: 01-Home or Self Care ? ? ? ? ? ? ?- [facility] --- ? ?Post-op:  ?Wound infection: No  ?Graft infection: No  ?Transfusion: No  If yes, 0 units given ?New Arrhythmia: No ?Patency judged by: [ X] Dopper only, [ X] Palpable graft pulse, '[ ]'$  Palpable distal pulse, '[ ]'$  ABI inc. > 0.15, '[ ]'$  Duplex ?D/C Ambulatory Status: Ambulatory ? ?Complications: ?MI: [ X] No, '[ ]'$  Troponin only, '[ ]'$  EKG or Clinical ?CHF: No ?Resp failure: [ X] none, '[ ]'$  Pneumonia, '[ ]'$  Ventilator ?Chg in renal function: [XValu.Nieves none, '[ ]'$  Inc. Cr > 0.5, '[ ]'$  Temp. Dialysis, '[ ]'$  Permanent dialysis ?Stroke: [ X] None, '[ ]'$  Minor, '[ ]'$  Major ?Return to OR: No  ?Reason for return to OR: '[ ]'$  Bleeding, '[ ]'$   Infection, '[ ]'$  Thrombosis, '[ ]'$  Revision ? ?Discharge medications: ?Statin use:  Yes ?ASA use:  Yes ?Plavix use:  No  for medical  reason not indicated ?Beta blocker use: Yes ?Coumadin use: No  for medical reason not indicated ? ? ? ?Patient Instructions:  ?Allergies as of 08/29/2021   ? ?   Reactions  ? Codeine Other (See Comments)  ? "hyper"  ? ?  ? ?  ?Medication List  ?  ? ?STOP taking these medications   ? ?sulfamethoxazole-trimethoprim 800-160 MG tablet ?Commonly known as: BACTRIM DS ?  ? ?  ? ?TAKE these medications   ? ?aspirin EC 81 MG tablet ?Take 81 mg by mouth daily. Swallow whole. ?  ?carvedilol 6.25 MG tablet ?Commonly known as: COREG ?Take 1 tablet (6.25 mg total) by mouth 2 (two) times daily. ?  ?ibuprofen 200 MG tablet ?Commonly known as: ADVIL ?Take 600-800 mg by mouth every 6 (six) hours as needed for moderate pain. ?  ?oxyCODONE-acetaminophen 5-325 MG tablet ?Commonly known as: PERCOCET/ROXICET ?Take 1 tablet by mouth every 6 (six) hours as needed for moderate pain. ?  ?phenylephrine 10 MG Tabs tablet ?Commonly known as: SUDAFED PE ?Take 10 mg by mouth every 4 (four) hours as needed (allergies). ?  ?PROBIOTIC PO ?Take 1 tablet by mouth every evening. ?  ?rosuvastatin 5 MG tablet ?Commonly known as: CRESTOR ?Take 5 mg by mouth in the morning. ?  ?Vitamin D (Ergocalciferol) 1.25 MG (50000 UNIT) Caps capsule ?Commonly known as: DRISDOL ?Take 50,000 Units by mouth every Saturday. ?  ? ?  ? ?  ?  ? ? ?  ?Discharge Care Instructions  ?(From admission, onward)  ?  ? ? ?  ? ?  Start     Ordered  ? 08/29/21 0000  Discharge wound care:       ?Comments: Wash incisions with mild soap and water, pat dry. Do not soak in tub  ? 08/29/21 7001  ? ?  ?  ? ?  ? ?Activity: activity as tolerated, no driving while on analgesics, and no heavy lifting for 6 weeks ?Diet: regular diet and low fat, low cholesterol diet ?Wound Care: keep wound clean and dry ? ?Follow-up with Dr. Stanford Breed in 3 weeks. ? ?Signed: ?Karlin Binion ?09/01/2021 ?9:20 AM ? ?

## 2021-08-29 NOTE — Discharge Instructions (Signed)
 [facility]  Discharge instructions  Lower Extremity Bypass Surgery  Please refer to the following instruction for your post-procedure care. Your surgeon or physician assistant will discuss any changes with you.  Activity  You are encouraged to walk as much as you can. You can slowly return to normal activities during the month after your surgery. Avoid strenuous activity and heavy lifting until your doctor tells you it's OK. Avoid activities such as vacuuming or swinging a golf club. Do not drive until your doctor give the OK and you are no longer taking prescription pain medications. It is also normal to have difficulty with sleep habits, eating and bowel movement after surgery. These will go away with time.  Bathing/Showering  Shower daily after you go home. Do not soak in a bathtub, hot tub, or swim until the incision heals completely.  Incision Care  Clean your incision with mild soap and water. Shower every day. Pat the area dry with a clean towel. You do not need a bandage unless otherwise instructed. Do not apply any ointments or creams to your incision. If you have open wounds you will be instructed how to care for them or a visiting nurse may be arranged for you. If you have staples or sutures along your incision they will be removed at your post-op appointment. You may have skin glue on your incision. Do not peel it off. It will come off on its own in about one week.  Wash the groin wound with soap and water daily and pat dry. (No tub bath-only shower)  Then put a dry gauze or washcloth in the groin to keep this area dry to help prevent wound infection.  Do this daily and as needed.  Do not use Vaseline or neosporin on your incisions.  Only use soap and water on your incisions and then protect and keep dry.  Diet  Resume your normal diet. There are no special food restrictions following this procedure. A low fat/ low cholesterol diet is  recommended for all patients with vascular disease. In order to heal from your surgery, it is CRITICAL to get adequate nutrition. Your body requires vitamins, minerals, and protein. Vegetables are the best source of vitamins and minerals. Vegetables also provide the perfect balance of protein. Processed food has little nutritional value, so try to avoid this.  Medications  Resume taking all your medications unless your doctor or physician assistant tells you not to. If your incision is causing pain, you may take over-the-counter pain relievers such as acetaminophen (Tylenol). If you were prescribed a stronger pain medication, please aware these medication can cause nausea and constipation. Prevent nausea by taking the medication with a snack or meal. Avoid constipation by drinking plenty of fluids and eating foods with high amount of fiber, such as fruits, vegetables, and grains. Take Colace 100 mg (an over-the-counter stool softener) twice a day as needed for constipation.  Do not take Tylenol if you are taking prescription pain medications.  Follow Up  Our office will schedule a follow up appointment 2-3 weeks following discharge.  Please call us immediately for any of the following conditions  Severe or worsening pain in your legs or feet while at rest or while walking Increase pain, redness, warmth, or drainage (pus) from your incision site(s) Fever of 101 degree or higher The swelling in your leg with the bypass suddenly worsens and becomes more painful than when you were in the hospital If you have   been instructed to feel your graft pulse then you should do so every day. If you can no longer feel this pulse, call the office immediately. Not all patients are given this instruction.  Leg swelling is common after leg bypass surgery.  The swelling should improve over a few months following surgery. To improve the swelling, you may elevate your legs above the level of your heart while you are  sitting or resting. Your surgeon or physician assistant may ask you to apply an ACE wrap or wear compression (TED) stockings to help to reduce swelling.  Reduce your risk of vascular disease  Stop smoking. If you would like help call [facility] at 1-800-QUIT-NOW (1-800-784-8669) or [place] at [phone].  Manage your cholesterol Maintain a desired weight Control your diabetes weight Control your diabetes Keep your blood pressure down  If you have any questions, please call the office at [phone]  

## 2021-09-16 ENCOUNTER — Other Ambulatory Visit: Payer: Self-pay

## 2021-09-16 DIAGNOSIS — I739 Peripheral vascular disease, unspecified: Secondary | ICD-10-CM

## 2021-09-16 NOTE — Progress Notes (Signed)
?POST OPERATIVE OFFICE NOTE ? ? ? ?CC:  F/u for surgery ? ?HPI:  This is a 61 y.o. female who is s/p excision of previous femoral to femoral bypass, bilateral CFA thromboendarterectomy and right axillary to bifemoral bypass with 15m PTFE on 08/26/2021 by Dr. HStanford Breedas she has had recent recurrent difficulty regarding colonic stricture and also reaccumulation of fluid collections in her abdominal wall requiring aspiration.  Over the past several months she has had recurrent rest pain in her right foot.  ? ?She has a very complicated past history.  She initially was seen for severe ischemia and claudication in her right foot with Dr.Chen in 2017.  She underwent angioplasty and stenting of her left common iliac artery and planning for left to right femorofemoral bypass.  She subsequently underwent a left to right femorofemoral bypass by myself on 08/10/2015.  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  She had a pelvic abscess and is having drainage of this with a prolonged seizure and placed prone position for drainage.  She had acute thrombosis of her femorofemoral bypass at this time and underwent thrombectomy of her femorofemoral bypass on 09/05/2015.  ? ?Pt returns today for follow up.  Pt states her legs and feet feel much better.  The sensation on the bottom of her foot is getting better.  She states that she has a feeling of rawness medial to the left groin incision.  She states that when she stretches, it feels tight under her right arm.  She continues to smoke but has cut back.  ? ? ?Allergies  ?Allergen Reactions  ? Codeine Other (See Comments)  ?  "hyper"  ? ? ?Current Outpatient Medications  ?Medication Sig Dispense Refill  ? aspirin EC 81 MG tablet Take 81 mg by mouth daily. Swallow whole.    ? carvedilol (COREG) 6.25 MG tablet Take 1 tablet (6.25 mg total) by mouth 2 (two) times daily. 180 tablet 3  ? ibuprofen (ADVIL) 200 MG tablet  Take 600-800 mg by mouth every 6 (six) hours as needed for moderate pain.    ? oxyCODONE-acetaminophen (PERCOCET/ROXICET) 5-325 MG tablet Take 1 tablet by mouth every 6 (six) hours as needed for moderate pain. 30 tablet 0  ? phenylephrine (SUDAFED PE) 10 MG TABS tablet Take 10 mg by mouth every 4 (four) hours as needed (allergies).    ? Probiotic Product (PROBIOTIC PO) Take 1 tablet by mouth every evening.    ? rosuvastatin (CRESTOR) 5 MG tablet Take 5 mg by mouth in the morning.    ? Vitamin D, Ergocalciferol, (DRISDOL) 1.25 MG (50000 UNIT) CAPS capsule Take 50,000 Units by mouth every Saturday.    ? ?No current facility-administered medications for this visit.  ? ? ? ROS:  See HPI ? ?Physical Exam: ? ?Today's Vitals  ? 09/17/21 1257  ?BP: (!) 149/77  ?Pulse: 73  ?Resp: 18  ?Temp: 97.7 ?F (36.5 ?C)  ?TempSrc: Temporal  ?SpO2: 100%  ?Weight: 191 lb 8 oz (86.9 kg)  ?Height: '5\' 2"'$  (1.575 m)  ?PainSc: 2   ? ?Body mass index is 35.03 kg/m?. ? ? ?Incision:  right chest and bilateral groin incisions look good.  She does have a spitting stitch distal left groin incision.  ?Extremities:  palpable right DP pulse and brisk doppler flow bilateral AT/PT; +swelling bilateral ankles.  Ax-fem with palpable pulse ?Abdomen:  soft, NT ? ? ? ?Assessment/Plan:  This is a 61y.o.  female who is s/p: ?excision of previous femoral to femoral bypass, bilateral CFA thromboendarterectomy and right axillary to bifemoral bypass with 35m PTFE on 08/26/2021 by Dr. HStanford Breed   ? ?-pt doing well with palpable right DP pulse and brisk doppler flow bilateral AT/PT.  Her incisions are healing nicely.  Sensation returning to her feet.  She does have some ankle swelling bilaterally most likely related to reperfusion.  Advised her elevate her legs several times a day and should improve over time.   ?-okay to drive and return to work.  She is not taking narcotic pain medication. ?-she has f/u studies with Dr. HStanford Breed5/23/2023 and she will keep that appt.   ?-discussed importance of smoking cessation and risk of limb loss.  She is trying to quit.  ?-continue asa/statin ? ? ?SLeontine Locket PAC ?[facility] ?[phone]? ? ?Clinic MD:  HStanford Breed? ?

## 2021-09-17 ENCOUNTER — Ambulatory Visit (INDEPENDENT_AMBULATORY_CARE_PROVIDER_SITE_OTHER): Payer: BC Managed Care – PPO | Admitting: Physician Assistant

## 2021-09-17 VITALS — BP 149/77 | HR 73 | Temp 97.7°F | Resp 18 | Ht 62.0 in | Wt 191.5 lb

## 2021-09-17 DIAGNOSIS — I739 Peripheral vascular disease, unspecified: Secondary | ICD-10-CM

## 2021-09-17 DIAGNOSIS — I70223 Atherosclerosis of native arteries of extremities with rest pain, bilateral legs: Secondary | ICD-10-CM

## 2021-09-17 DIAGNOSIS — F17200 Nicotine dependence, unspecified, uncomplicated: Secondary | ICD-10-CM

## 2021-09-18 ENCOUNTER — Encounter: Payer: Self-pay | Admitting: Cardiovascular Disease

## 2021-09-18 ENCOUNTER — Ambulatory Visit (INDEPENDENT_AMBULATORY_CARE_PROVIDER_SITE_OTHER): Payer: BC Managed Care – PPO | Admitting: Cardiovascular Disease

## 2021-09-18 VITALS — BP 145/69 | HR 72 | Ht 62.0 in | Wt 192.8 lb

## 2021-09-18 DIAGNOSIS — E785 Hyperlipidemia, unspecified: Secondary | ICD-10-CM | POA: Diagnosis not present

## 2021-09-18 DIAGNOSIS — I10 Essential (primary) hypertension: Secondary | ICD-10-CM | POA: Diagnosis not present

## 2021-09-18 DIAGNOSIS — I2510 Atherosclerotic heart disease of native coronary artery without angina pectoris: Secondary | ICD-10-CM

## 2021-09-18 DIAGNOSIS — I739 Peripheral vascular disease, unspecified: Secondary | ICD-10-CM

## 2021-09-18 DIAGNOSIS — F17200 Nicotine dependence, unspecified, uncomplicated: Secondary | ICD-10-CM

## 2021-09-18 MED ORDER — EZETIMIBE 10 MG PO TABS: 10.0000 mg | ORAL_TABLET | Freq: Every day | ORAL | 3 refills | Status: DC

## 2021-09-18 MED ORDER — ROSUVASTATIN CALCIUM 10 MG PO TABS: 10.0000 mg | ORAL_TABLET | Freq: Every day | ORAL | 3 refills | Status: DC

## 2021-09-18 NOTE — Progress Notes (Signed)
?Cardiology Office Note:   ? ?Date:  09/22/2021  ? ?ID:  Cathy Jones, DOB Aug 10, 1960, MRN [id number] ? ?PCP:  Celene Squibb, MD ?  ?[facility] Providers ?Cardiologist:  Sanda Klein, MD    ? ?Referring MD: Celene Squibb, MD  ? ?Chief Complaint  ?Patient presents with  ? PAD  ? ? ? ?History of Present Illness:   ? ?Cathy Jones is a 61 y.o. 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 ? ?Subsequently underwent angioplasty-stent of the right iliac artery in 2017 (iCAST 7x22), but later had to undergo repeat right iliac thrombectomy and left to right femorofemoral bypass with 8 mm Hemashield graft on 08/10/2015 by Dr. Donnetta Hutching and then had to have a thrombectomy of the femorofemoral bypass just a month later.  Due to ischemic rest pain both lower extremities, secondary to occlusion of the distal aorta she underwent repeat surgery on 08/26/2021 by Dr. Stanford Breed (removal of previous femorofemoral bypass, bilateral common femoral thromboendarterectomy and right axillary to bifemoral bypass with subcutaneous 8 mm PTFE graft). ? ?She feels much better.  She no longer has pain and now enjoys walking.  She has started to gain back some sensation in her feet where she had profound paresthesias/dysesthesia.  Unfortunately she is still smoking, although she has cut back ? ?She denies problems with angina, dyspnea, palpitations, dizziness or syncope or focal neurological complaints.  Her blood pressure is a little high today, but at home it has consistently been in the 120s/60s. ? ?She has a longstanding history of asthma.  She has a remote history of cervical cancer for which she underwent total abdominal hysterectomy, chemotherapy and radiation therapy and later developed a rectal stricture for which she underwent diverting loop ileostomy and ureteral stent placement in  March 2017. Subsequently underwent angioplasty-stent of the right iliac artery , but later had to undergo repeat right iliac thrombectomy and left to right femorofemoral bypass with 8 mm Hemashield graft on 08/10/2015 by Dr. Donnetta Hutching and then had to have a thrombectomy of the femorofemoral bypass just a month later. She later presented with acute renal failure related to high output ostomy.  She had closure of the ileostomy later the same year. ? ?She has had problems with recurrent colonic stricture and reaccumulation of fluid collections in the abdominal wall requiring aspiration. CT of the abdomen with contrast performed in September 2022 showed interval infrarenal aortic occlusion with collateral reconstitution of the left common femoral artery and patent femorofemoral bypass.   ? ? ?She has a family history of CAD, albeit not at very young ages.  Her father had bypass surgery in his mid 70s she has smoked roughly half a pack a day since age 33 for total of about 20 pack years.  She denies coughing or wheezing.  She does not have diabetes mellitus.  She has had problems with abnormal renal function tests in the past, but her most recent creatinine was 1.0.  Her recent lipid profile shows an LDL cholesterol of 139, before initiation of statin therapy. ? ?Carotid duplex ultrasound performed in April 2022 showed mild atheromatous plaque of the internal carotid artery origins with less than 50% stenoses.  CT abdomen shows very severe atherosclerotic calcification of the distal aorta and proximal common iliac arteries.  There remarkably less atherosclerosis in the upper  half of the abdominal aorta and the small portion of the thoracic aorta that is visible.  Similarly, there is no coronary artery calcification, although the heart is not entirely covered by the scan. ? ?Past Medical History:  ?Diagnosis Date  ? Acute renal failure (ARF) ([place]) 08/07/2015  ? Allergy   ? Anemia   ? Anxiety   ? Asthma   ? pt brought her  proair inhaler 06-06-14  ? Cancer [facility])   ? cervical  ? Colon polyp   ? hyperplastic  ? Colon stricture ([place])   ? Colonic obstruction ([place])   ? Depression   ? Family history of adverse reaction to anesthesia   ? pts mother experiences nausea and vomiting   ? GERD (gastroesophageal reflux disease)   ? History of bronchitis   ? History of cervical cancer   ? 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 History of chemotherapy   ? History of hiatal hernia   ? History of radiation therapy   ? Hyperlipidemia   ? Hypertension   ? 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 Migraine   ? "q couple years maybe" (08/07/2015)  ? Shortness of breath dyspnea   ? seasonal   ? ? ?Past Surgical History:  ?Procedure Laterality Date  ? ABDOMINAL AORTOGRAM W/LOWER EXTREMITY Bilateral 07/26/2021  ? Procedure: ABDOMINAL AORTOGRAM W/LOWER EXTREMITY;  Surgeon: Cherre Robins, MD;  Location: [place] [facility];  Service: Cardiovascular;  Laterality: Bilateral;  ? AXILLARY-FEMORAL BYPASS GRAFT Right 08/26/2021  ? Procedure: RIGHT AXILLO-BIFEMORAL BYPASS WITH GORETEC GRAFT;  Surgeon: Cherre Robins, MD;  Location: [facility];  Service: Vascular;  Laterality: Right;  right axilla to right groin, right groin to left groin  ? COLON RESECTION N/A 06/27/2015  ? Procedure: LYSIS OF ADHESIONS LOW ANTERIOR RESECTION DIVERTING LOOP ILEOSTOMY;  Surgeon: Leighton Ruff, MD;  Location: [facility];  Service: General;  Laterality: N/A;  ? COLON SURGERY    ? COLONOSCOPY    ? CYSTOSCOPY WITH STENT PLACEMENT Bilateral 06/27/2015  ? Procedure: CYSTOSCOPY WITH CATHETER  PLACEMENT;  Surgeon: Franchot Gallo, MD;  Location: [facility];  Service: Urology;  Laterality: Bilateral;  ? DIVERTING ILEOSTOMY N/A 06/27/2015  ? Procedure: DIVERTING LOOP ILEOSTOMY;  Surgeon: Leighton Ruff, MD;  Location: [facility];  Service: General;  Laterality: N/A;  ? FEMORAL-FEMORAL BYPASS GRAFT Bilateral 08/10/2015  ? Procedure: BYPASS GRAFT LEFT  FEMORALTO RIGHT FEMORAL ARTERY;  Surgeon: Rosetta Posner, MD;  Location: [place];  Service: Vascular;  Laterality: Bilateral;  ? FLEXIBLE SIGMOIDOSCOPY  12/12/2015  ? Procedure: FLEXIBLE SIGMOIDOSCOPY;  Surgeon: Leighton Ruff, MD;  Location: [facility];  Service: General;;  ? ILEOSTOMY CLOSURE N/A 12/12/2015  ? Procedure: OPEN ILEOSTOMY REVERSAL AND PELVIC WASHOUT;  Surgeon: Leighton Ruff, MD;  Location: [facility];  Service: General;  Laterality: N/A;  ? INTRAOPERATIVE ARTERIOGRAM Right 08/10/2015  ? Procedure: INTRA OPERATIVE ARTERIOGRAM Right Iliac Artery;  Surgeon: Rosetta Posner, MD;  Location: [place];  Service: Vascular;  Laterality: Right;  ? IR GENERIC HISTORICAL  09/12/2015  ? IR RADIOLOGIST EVAL & MGMT 09/12/2015 Monia Sabal, PA-C [facility] INTERV RAD  ? IR RADIOLOGIST EVAL & MGMT  06/23/2018  ? IR RADIOLOGIST EVAL & MGMT  07/07/2018  ? IV antibiotic infusion therapy     ? PERIPHERAL VASCULAR CATHETERIZATION N/A 08/09/2015  ? Procedure: Abdominal Aortogram w/Lower Extremity;  Surgeon: Conrad Ashwaubenon, MD;  Location: [place] [facility]  [facility];  Service: Cardiovascular;  Laterality: N/A;  ? PERIPHERAL VASCULAR CATHETERIZATION Left 08/09/2015  ? Procedure: Peripheral Vascular Intervention;  Surgeon: Conrad [place], MD;  Location: [place] [facility];  Service: Cardiovascular;  Laterality: Left;  common iliac  ? PICC LINE PLACE PERIPHERAL ([place] HX)    ? RADICAL ABDOMINAL HYSTERECTOMY  1994  ? THROMBECTOMY FEMORAL ARTERY Bilateral 09/05/2015  ? Procedure: THROMBECTOMY Left to Right Femoral to  FEMORAL ARTERY Bypass,.;  Surgeon: Mal Misty, MD;  Location: [place];  Service: Vascular;  Laterality: Bilateral;  ? THROMBECTOMY ILIAC ARTERY Right 08/10/2015  ? Procedure: THROMBECTOMY Right ILIAC ARTERY;  Surgeon: Rosetta Posner, MD;  Location: [place];  Service: Vascular;  Laterality: Right;  ? TUBAL LIGATION    ? UPPER GASTROINTESTINAL ENDOSCOPY    ? ? ?Current Medications: ?Current Meds  ?Medication Sig  ? aspirin EC 81 MG tablet Take 81 mg by  mouth daily. Swallow whole.  ? carvedilol (COREG) 6.25 MG tablet Take 1 tablet (6.25 mg total) by mouth 2 (two) times daily.  ? ezetimibe (ZETIA) 10 MG tablet Take 1 tablet (10 mg total) by mouth daily.  ? ib

## 2021-09-18 NOTE — Patient Instructions (Addendum)
Medication Instructions:  ?INCREASE the Rosuvastatin to 10 mg once daily ?START Zetia 10 mg once daily ? ?*If you need a refill on your cardiac medications before your next appointment, please call your pharmacy* ? ? ?Lab Work: ?Your provider would like for you to return in 3 months to have the following labs drawn: fasting Lipid. You do not need an appointment for the lab. Once in our office lobby there is a podium where you can sign in and ring the doorbell to alert [place] that you are here. The lab is open from 8:00 am to 4 pm; closed for lunch from 12:45pm-1:45pm. ? ?You may also go to any of these [facility] locations: ?  ?[place] ?- [place] ([facility]) ?- [address] ?- [place] ?If you have labs (blood work) drawn today and your tests are completely normal, you will receive your results only by: ?MyChart Message (if you have MyChart) OR ?A paper copy in the mail ?If you have any lab test that is abnormal or we need to change your treatment, we will call you to review the results. ? ? ?Testing/Procedures: ?None ordered ? ? ?Follow-Up: ?At [facility], you and your health needs are our priority.  As part of our continuing mission to provide you with exceptional heart care, we have created designated Provider Care Teams.  These Care Teams include your primary Cardiologist (physician) and Advanced Practice Providers (APPs -  Physician Assistants and Nurse Practitioners) who all work together to provide you with the care you need, when you need it. ? ?We recommend signing up for the patient portal called "MyChart".  Sign up information is provided on this After Visit Summary.  MyChart is used to connect with patients for Virtual Visits (Telemedicine).  Patients are able to view lab/test results, encounter notes, upcoming appointments, etc.  Non-urgent messages can be sent to your provider as well.   ?To learn more about what you can do with  MyChart, go to [email or web address].   ? ?Your next appointment:   ?12 month(s) ? ?The format for your next appointment:   ?In Person ? ?Provider:   ?Sanda Klein, MD { ? ? ?Important Information About Sugar ? ? ? ? ? ? ?

## 2021-09-22 ENCOUNTER — Encounter: Payer: Self-pay | Admitting: Cardiovascular Disease

## 2021-09-23 DIAGNOSIS — R7301 Impaired fasting glucose: Secondary | ICD-10-CM | POA: Diagnosis not present

## 2021-09-23 DIAGNOSIS — Z6835 Body mass index (BMI) 35.0-35.9, adult: Secondary | ICD-10-CM | POA: Diagnosis not present

## 2021-09-27 DIAGNOSIS — E875 Hyperkalemia: Secondary | ICD-10-CM | POA: Diagnosis not present

## 2021-09-27 DIAGNOSIS — D72829 Elevated white blood cell count, unspecified: Secondary | ICD-10-CM | POA: Diagnosis not present

## 2021-09-27 DIAGNOSIS — I739 Peripheral vascular disease, unspecified: Secondary | ICD-10-CM | POA: Diagnosis not present

## 2021-09-27 DIAGNOSIS — I6529 Occlusion and stenosis of unspecified carotid artery: Secondary | ICD-10-CM | POA: Diagnosis not present

## 2021-09-30 NOTE — Progress Notes (Unsigned)
POST OPERATIVE OFFICE NOTE    CC:  F/u for surgery  HPI:  This is a 61 y.o. female who is s/p excision of previous femoral to femoral bypass, bilateral CFA thromboendarterectomy and right axillary to bifemoral bypass with 52m PTFE on 08/26/2021 by Dr. HStanford Breedas she has had recent recurrent difficulty regarding colonic stricture and also reaccumulation of fluid collections in her abdominal wall requiring aspiration.  Over the past several months she has had recurrent rest pain in her right foot.   She has a very complicated past history.  She initially was seen for severe ischemia and claudication in her right foot with Dr.Chen in 2017.  She underwent angioplasty and stenting of her left common iliac artery and planning for left to right femorofemoral bypass.  She subsequently underwent a left to right femorofemoral bypass by myself on 08/10/2015.  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  She had a pelvic abscess and is having drainage of this with a prolonged seizure and placed prone position for drainage.  She had acute thrombosis of her femorofemoral bypass at this time and underwent thrombectomy of her femorofemoral bypass on 09/05/2015.   Pt returns today for follow up.  Pt states her legs and feet feel much better.  The sensation on the bottom of her foot is getting better.  She states that she has a feeling of rawness medial to the left groin incision.  She states that when she stretches, it feels tight under her right arm.  She continues to smoke but has cut back.    Allergies  Allergen Reactions   Codeine Other (See Comments)    "hyper"    Current Outpatient Medications  Medication Sig Dispense Refill   aspirin EC 81 MG tablet Take 81 mg by mouth daily. Swallow whole.     carvedilol (COREG) 6.25 MG tablet Take 1 tablet (6.25 mg total) by mouth 2 (two) times daily. 180 tablet 3   ezetimibe (ZETIA) 10 MG tablet  Take 1 tablet (10 mg total) by mouth daily. 90 tablet 3   ibuprofen (ADVIL) 200 MG tablet Take 600-800 mg by mouth every 6 (six) hours as needed for moderate pain.     oxyCODONE-acetaminophen (PERCOCET/ROXICET) 5-325 MG tablet Take 1 tablet by mouth every 6 (six) hours as needed for moderate pain. (Patient not taking: Reported on 09/18/2021) 30 tablet 0   phenylephrine (SUDAFED PE) 10 MG TABS tablet Take 10 mg by mouth every 4 (four) hours as needed (allergies).     Probiotic Product (PROBIOTIC PO) Take 1 tablet by mouth every evening.     rosuvastatin (CRESTOR) 10 MG tablet Take 1 tablet (10 mg total) by mouth daily. 90 tablet 3   Vitamin D, Ergocalciferol, (DRISDOL) 1.25 MG (50000 UNIT) CAPS capsule Take 50,000 Units by mouth every Saturday.     No current facility-administered medications for this visit.     ROS:  See HPI  Physical Exam:  There were no vitals filed for this visit.  There is no height or weight on file to calculate BMI.   Incision:  right chest and bilateral groin incisions look good.  She does have a spitting stitch distal left groin incision.  Extremities:  palpable right DP pulse and brisk doppler flow bilateral AT/PT; +swelling bilateral ankles.  Ax-fem with palpable pulse Abdomen:  soft, NT    Assessment/Plan:  This is a 61y.o. female who is s/p: excision of previous  femoral to femoral bypass, bilateral CFA thromboendarterectomy and right axillary to bifemoral bypass with 82m PTFE on 08/26/2021 by Dr. HStanford Breed    -pt doing well with palpable right DP pulse and brisk doppler flow bilateral AT/PT.  Her incisions are healing nicely.  Sensation returning to her feet.  She does have some ankle swelling bilaterally most likely related to reperfusion.  Advised her elevate her legs several times a day and should improve over time.   -okay to drive and return to work.  She is not taking narcotic pain medication. -she has f/u studies with Dr. HStanford Breed5/23/2023 and she will  keep that appt.  -discussed importance of smoking cessation and risk of limb loss.  She is trying to quit.  -continue asa/statin   SLeontine Locket [facility] [phone]  Clinic MD:  HStanford Breed

## 2021-10-01 ENCOUNTER — Ambulatory Visit (INDEPENDENT_AMBULATORY_CARE_PROVIDER_SITE_OTHER)
Admit: 2021-10-01 | Discharge: 2021-10-01 | Disposition: A | Discharge status: Home / Self Care | Payer: BC Managed Care – PPO | Attending: Vascular Surgery | Admitting: Vascular Surgery

## 2021-10-01 ENCOUNTER — Ambulatory Visit (INDEPENDENT_AMBULATORY_CARE_PROVIDER_SITE_OTHER): Payer: BC Managed Care – PPO | Admitting: Vascular Surgery

## 2021-10-01 ENCOUNTER — Encounter: Payer: Self-pay | Admitting: Vascular Surgery

## 2021-10-01 ENCOUNTER — Ambulatory Visit (HOSPITAL_COMMUNITY)
Admission: RE | Admit: 2021-10-01 | Discharge: 2021-10-01 | Disposition: A | Discharge status: Home / Self Care | Payer: BC Managed Care – PPO | Source: Ambulatory Visit | Attending: Vascular Surgery | Admitting: Vascular Surgery

## 2021-10-01 VITALS — BP 161/74 | HR 72 | Temp 98.7°F | Resp 20 | Ht 62.0 in | Wt 194.0 lb

## 2021-10-01 DIAGNOSIS — I739 Peripheral vascular disease, unspecified: Secondary | ICD-10-CM | POA: Insufficient documentation

## 2021-10-04 ENCOUNTER — Other Ambulatory Visit: Payer: Self-pay | Admitting: *Deleted

## 2021-10-04 DIAGNOSIS — I739 Peripheral vascular disease, unspecified: Secondary | ICD-10-CM

## 2021-10-04 DIAGNOSIS — I70221 Atherosclerosis of native arteries of extremities with rest pain, right leg: Secondary | ICD-10-CM

## 2021-11-11 DIAGNOSIS — R338 Other retention of urine: Secondary | ICD-10-CM | POA: Diagnosis not present

## 2021-11-11 DIAGNOSIS — Z8541 Personal history of malignant neoplasm of cervix uteri: Secondary | ICD-10-CM | POA: Diagnosis not present

## 2021-11-13 DIAGNOSIS — R338 Other retention of urine: Secondary | ICD-10-CM | POA: Diagnosis not present

## 2021-11-13 DIAGNOSIS — R310 Gross hematuria: Secondary | ICD-10-CM | POA: Diagnosis not present

## 2021-11-27 DIAGNOSIS — R338 Other retention of urine: Secondary | ICD-10-CM | POA: Diagnosis not present

## 2021-12-09 DIAGNOSIS — R338 Other retention of urine: Secondary | ICD-10-CM | POA: Diagnosis not present

## 2021-12-09 DIAGNOSIS — R310 Gross hematuria: Secondary | ICD-10-CM | POA: Diagnosis not present

## 2021-12-16 DIAGNOSIS — R339 Retention of urine, unspecified: Secondary | ICD-10-CM | POA: Diagnosis not present

## 2021-12-27 DIAGNOSIS — R338 Other retention of urine: Secondary | ICD-10-CM | POA: Diagnosis not present

## 2022-02-19 IMAGING — US US CAROTID DUPLEX BILAT
1 series · 13 of 24 positions shown · non-contrast
Comparison: None.

CLINICAL DATA: Right carotid bruit

EXAM:
BILATERAL CAROTID DUPLEX ULTRASOUND
TECHNIQUE: Gray scale imaging, color Doppler and duplex ultrasound were
performed of bilateral carotid and vertebral arteries in the neck.

[Series 1: us carotid bilateral · 13 of 68 slices shown]
[im 1/68]
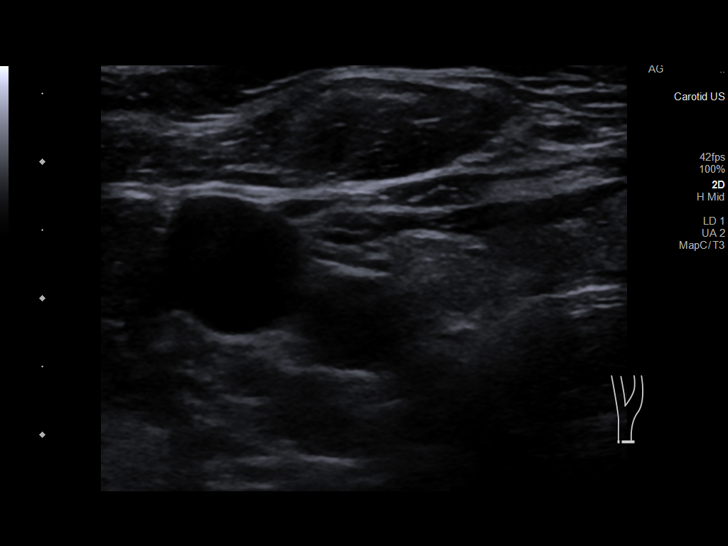
[im 6/68]
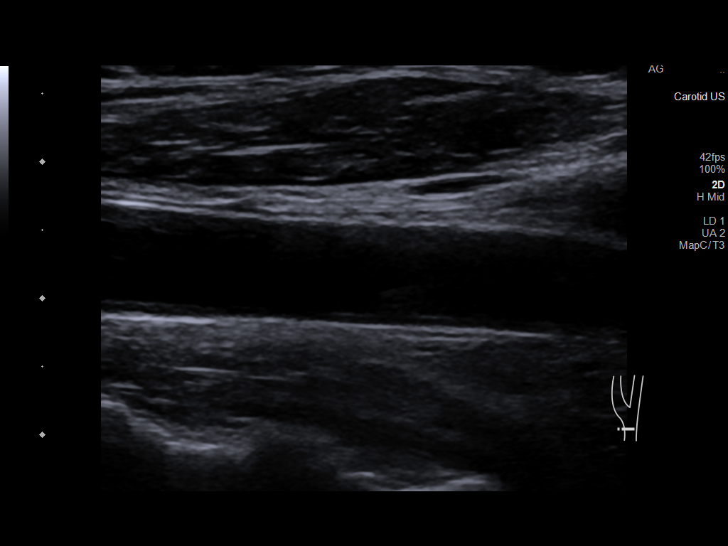
[im 12/68]
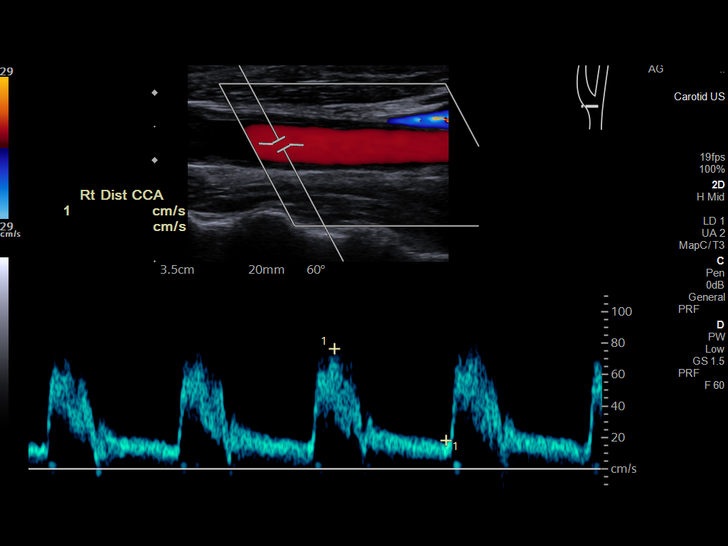
[im 18/68]
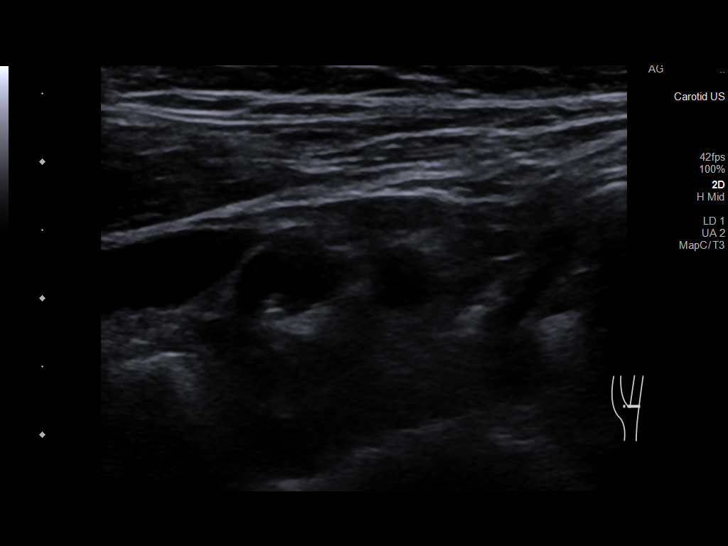
[im 24/68]
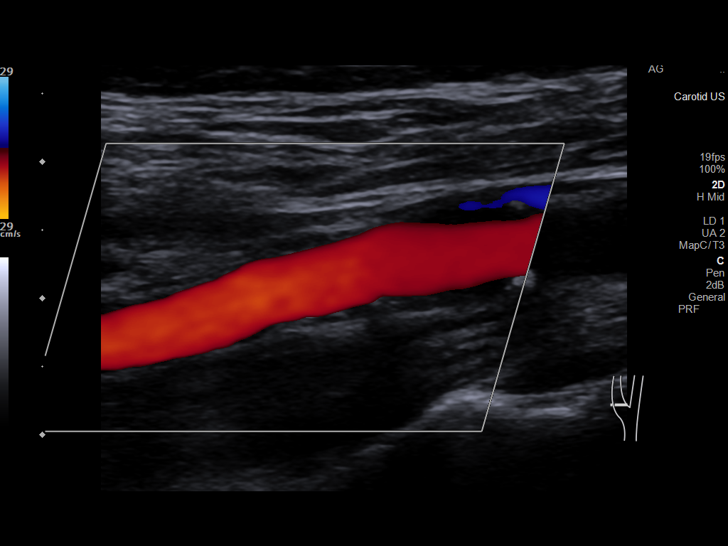
[im 30/68]
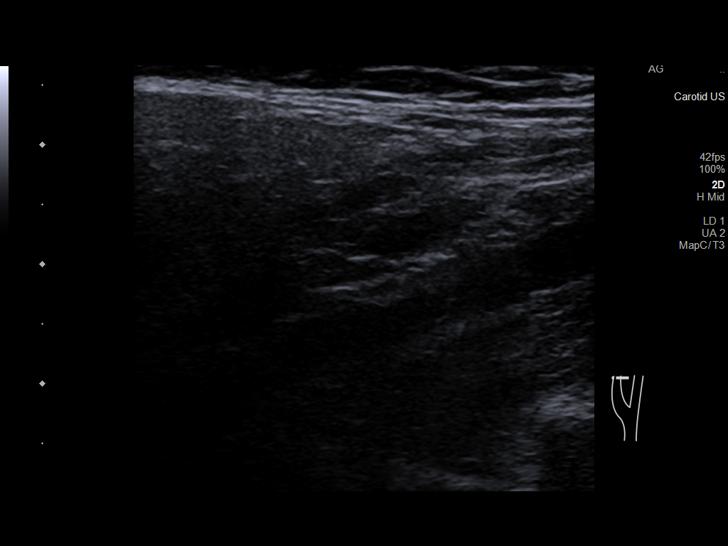
[im 35/68]
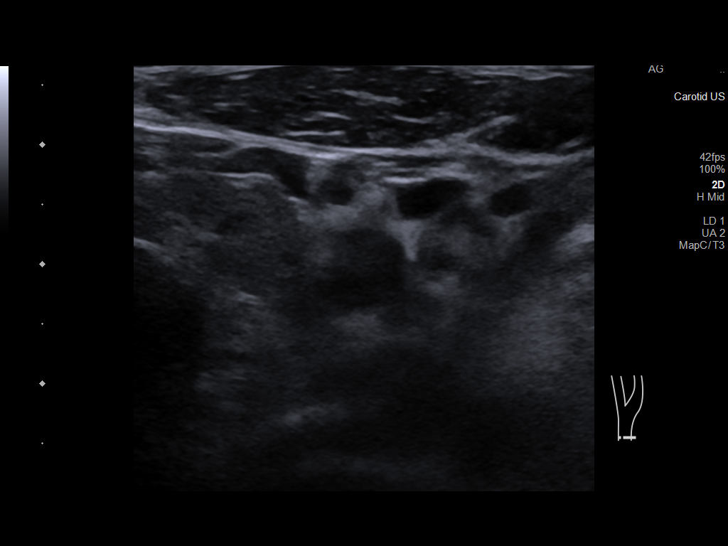
[im 38/68]
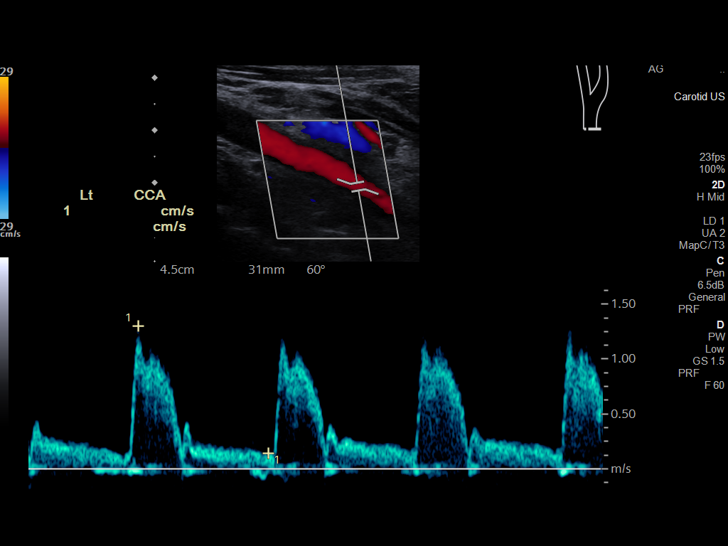
[im 44/68]
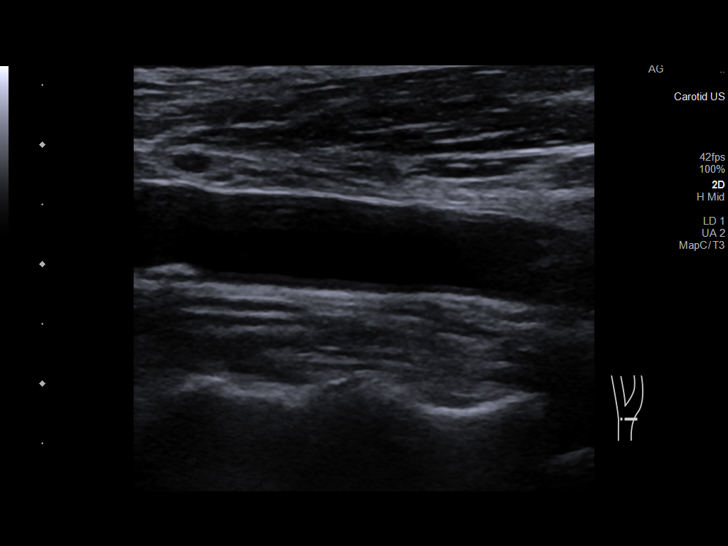
[im 50/68]
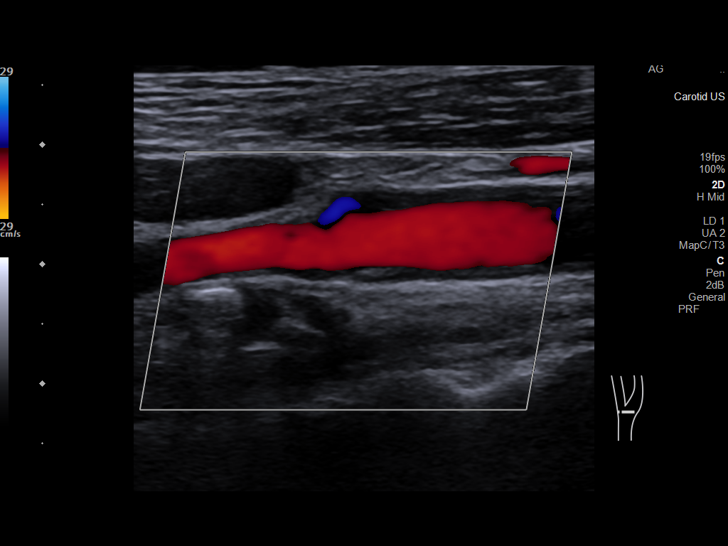
[im 56/68]
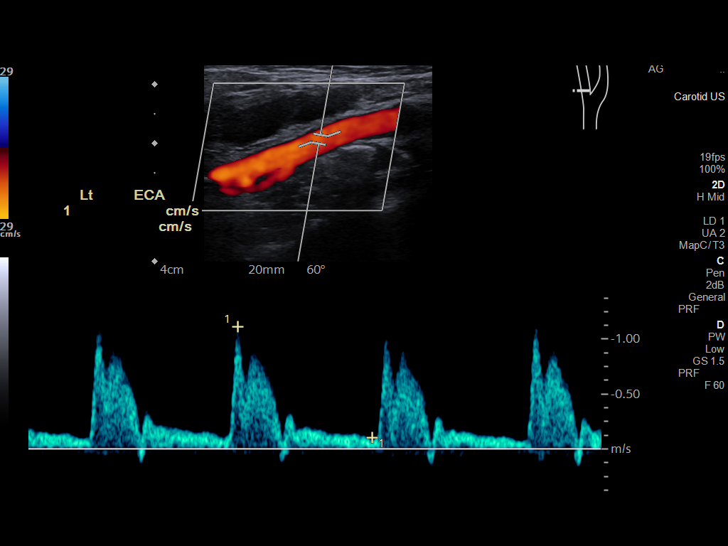
[im 62/68]
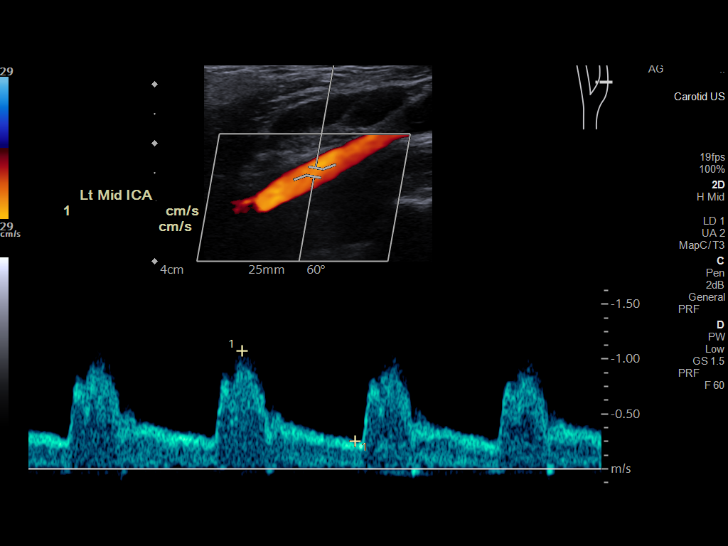
[im 68/68]
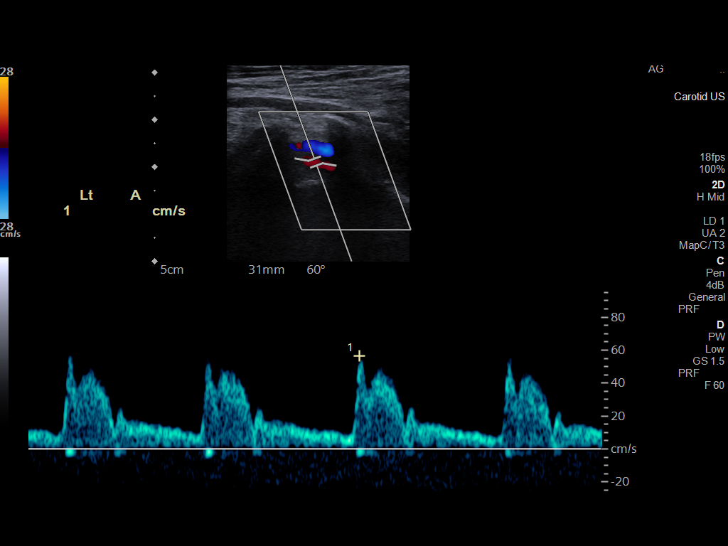

[13 of 24 positions shown; findings below may reference images not displayed]

FINDINGS: Criteria: Quantification of carotid stenosis is based on velocity
parameters that correlate the residual internal carotid diameter
with NASCET-based stenosis levels, using the diameter of the distal
internal carotid lumen as the denominator for stenosis measurement.

The following velocity measurements were obtained:

RIGHT

ICA: 121/32 cm/sec

CCA: 84/18 cm/sec

SYSTOLIC ICA/CCA RATIO:

ECA: 123 cm/sec

LEFT

ICA: 111/29 cm/sec

CCA: 107/21 cm/sec

SYSTOLIC ICA/CCA RATIO:

ECA: 111 cm/sec

RIGHT CAROTID ARTERY: Mild heterogeneous atheromatous plaque of the
internal carotid artery origin without significant stenosis.

RIGHT VERTEBRAL ARTERY:  Antegrade flow.

LEFT CAROTID ARTERY: Mild heterogeneous plaque of the internal
carotid artery origin without significant stenosis.

LEFT VERTEBRAL ARTERY:  Antegrade flow.
IMPRESSION: Mild atheromatous plaque of the internal carotid artery origins with
velocity parameters indicating less than 50% stenosis.

## 2022-03-13 DIAGNOSIS — R339 Retention of urine, unspecified: Secondary | ICD-10-CM | POA: Diagnosis not present

## 2022-03-24 DIAGNOSIS — R7301 Impaired fasting glucose: Secondary | ICD-10-CM | POA: Diagnosis not present

## 2022-03-24 DIAGNOSIS — E782 Mixed hyperlipidemia: Secondary | ICD-10-CM | POA: Diagnosis not present

## 2022-03-24 DIAGNOSIS — E559 Vitamin D deficiency, unspecified: Secondary | ICD-10-CM | POA: Diagnosis not present

## 2022-03-31 DIAGNOSIS — Z0001 Encounter for general adult medical examination with abnormal findings: Secondary | ICD-10-CM | POA: Diagnosis not present

## 2022-03-31 DIAGNOSIS — D72829 Elevated white blood cell count, unspecified: Secondary | ICD-10-CM | POA: Diagnosis not present

## 2022-03-31 DIAGNOSIS — I739 Peripheral vascular disease, unspecified: Secondary | ICD-10-CM | POA: Diagnosis not present

## 2022-03-31 DIAGNOSIS — I6529 Occlusion and stenosis of unspecified carotid artery: Secondary | ICD-10-CM | POA: Diagnosis not present

## 2022-04-08 ENCOUNTER — Encounter: Payer: Self-pay | Admitting: Vascular Surgery

## 2022-04-08 ENCOUNTER — Ambulatory Visit (HOSPITAL_COMMUNITY)
Admission: RE | Admit: 2022-04-08 | Discharge: 2022-04-08 | Disposition: A | Discharge status: Home / Self Care | Payer: BC Managed Care – PPO | Source: Ambulatory Visit | Attending: Vascular Surgery | Admitting: Vascular Surgery

## 2022-04-08 ENCOUNTER — Ambulatory Visit (INDEPENDENT_AMBULATORY_CARE_PROVIDER_SITE_OTHER): Payer: BC Managed Care – PPO | Admitting: Vascular Surgery

## 2022-04-08 ENCOUNTER — Ambulatory Visit (INDEPENDENT_AMBULATORY_CARE_PROVIDER_SITE_OTHER)
Admission: RE | Admit: 2022-04-08 | Discharge: 2022-04-08 | Disposition: A | Discharge status: Home / Self Care | Payer: BC Managed Care – PPO | Source: Ambulatory Visit | Attending: Vascular Surgery | Admitting: Vascular Surgery

## 2022-04-08 VITALS — BP 145/75 | HR 81 | Temp 98.7°F | Resp 20 | Ht 62.0 in | Wt 190.0 lb

## 2022-04-08 DIAGNOSIS — I739 Peripheral vascular disease, unspecified: Secondary | ICD-10-CM | POA: Insufficient documentation

## 2022-04-08 DIAGNOSIS — I70221 Atherosclerosis of native arteries of extremities with rest pain, right leg: Secondary | ICD-10-CM | POA: Diagnosis not present

## 2022-04-08 NOTE — Progress Notes (Signed)
POST OPERATIVE OFFICE NOTE    CC:  F/u for surgery  HPI:  This is a 61 y.o. female who is s/p excision of previous femoral to femoral bypass, bilateral CFA thromboendarterectomy and right axillary to bifemoral bypass with 55m PTFE on 08/26/2021   She has a very complicated past history.  She initially was seen for severe ischemia and claudication in her right foot with Dr.Chen in 2017.  She underwent angioplasty and stenting of her left common iliac artery and planning for left to right femorofemoral bypass.  She subsequently underwent a left to right femorofemoral bypass on 08/10/2015.  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  She had a pelvic abscess and is having drainage of this with a prolonged seizure and placed prone position for drainage.  She had acute thrombosis of her femorofemoral bypass at this time and underwent thrombectomy of her femorofemoral bypass on 09/05/2015.   Pt returns today for follow up.  Pt states her legs and feet feel much better.  The sensation on the bottom of her foot is getting better.  She states that she has a feeling of rawness medial to the left groin incision.  She states that when she stretches, it feels tight under her right arm.  She continues to smoke but has cut back.   10/01/21: Patient returns for surveillance studies.  She is doing well overall.  Her feet feel good.  She has some swelling in her lower extremities.  She notices some tightness in her right shoulder.  04/08/22: Patient returns for surveillance studies.  She is doing very well overall.  She has been able to remodel her house, and has been as active as she likes.  She does notice some occasional cramping in her right foot but otherwise has nothing to report.  Allergies  Allergen Reactions   Codeine Other (See Comments)    "hyper"    Current Outpatient Medications  Medication Sig Dispense Refill   aspirin EC 81  MG tablet Take 81 mg by mouth daily. Swallow whole.     carvedilol (COREG) 6.25 MG tablet Take 1 tablet (6.25 mg total) by mouth 2 (two) times daily. 180 tablet 3   ezetimibe (ZETIA) 10 MG tablet Take 1 tablet (10 mg total) by mouth daily. 90 tablet 3   ibuprofen (ADVIL) 200 MG tablet Take 600-800 mg by mouth every 6 (six) hours as needed for moderate pain.     phenylephrine (SUDAFED PE) 10 MG TABS tablet Take 10 mg by mouth every 4 (four) hours as needed (allergies).     Probiotic Product (PROBIOTIC PO) Take 1 tablet by mouth every evening.     rosuvastatin (CRESTOR) 10 MG tablet Take 1 tablet (10 mg total) by mouth daily. 90 tablet 3   Vitamin D, Ergocalciferol, (DRISDOL) 1.25 MG (50000 UNIT) CAPS capsule Take 50,000 Units by mouth every Saturday.     No current facility-administered medications for this visit.     ROS:  See HPI  Physical Exam:  Today's Vitals   04/08/22 1531  BP: (!) 145/75  Pulse: 81  Resp: 20  Temp: 98.7 F (37.1 C)  SpO2: 98%  Weight: 190 lb (86.2 kg)  Height: '5\' 2"'$  (1.575 m)    Body mass index is 34.75 kg/m.   Incision:  right chest and bilateral groin incisions look good.  She does have a spitting stitch distal left groin incision.  Extremities:  palpable right DP  pulse and brisk doppler flow bilateral AT/PT; +swelling bilateral ankles.  Ax-fem with palpable pulse Abdomen:  soft, NT    Assessment/Plan:  This is a 61 y.o. female who is s/p: excision of previous femoral to femoral bypass, bilateral CFA thromboendarterectomy and right axillary to bifemoral bypass with 54m PTFE on 08/26/2021  Doing well overall.  Her noninvasive studies are reassuring.  We will start surveillance.  I will see her back in 12 months with ABI and duplex of the ax bifemoral graft.  TYevonne Aline HStanford Breed MD [facility] Surgery CenterPhone Number: ([phone]/28/2023 4:46 PM

## 2022-07-14 DIAGNOSIS — R339 Retention of urine, unspecified: Secondary | ICD-10-CM | POA: Diagnosis not present

## 2022-07-16 IMAGING — CT CT ABD-PELV W/ CM
2 of 5 series · 16 of 46 positions shown, 18 images · IV contrast (Omnipaque or Isovue)
Comparison: CT 07/07/2018 and previous

CLINICAL DATA: Abdominal mass.  History of cervical carcinoma

EXAM:
CT ABDOMEN AND PELVIS WITH CONTRAST
TECHNIQUE: Multidetector CT imaging of the abdomen and pelvis was performed
using the standard protocol following bolus administration of
intravenous contrast.
CONTRAST:  80mL OMNIPAQUE IOHEXOL 350 MG/ML SOLN

[Series 2: axial st · axial · 0.81mm/px · z∈[+915,+1340]mm · 13 of 97 slices shown, 15 images]
[im 6/97  soft-tissue]
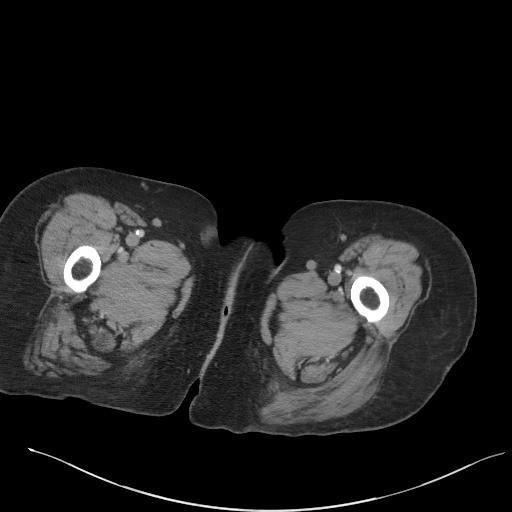
[im 6/97  bone]
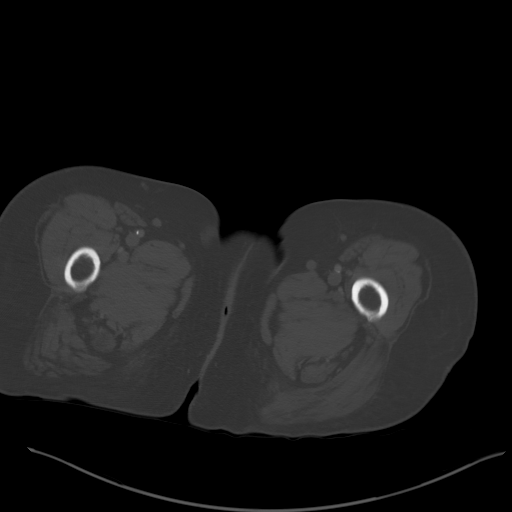
[im 12/97  soft-tissue]
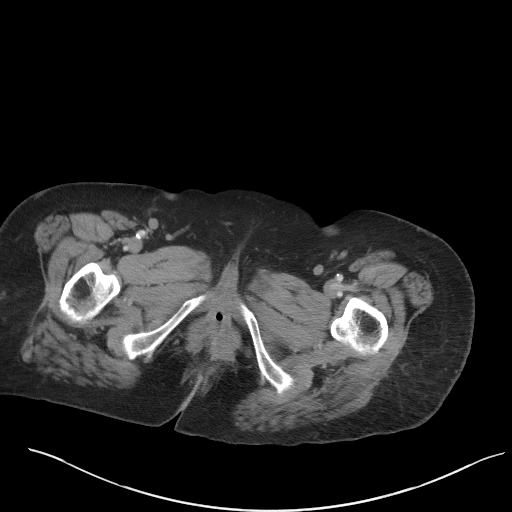
[im 23/97  soft-tissue]
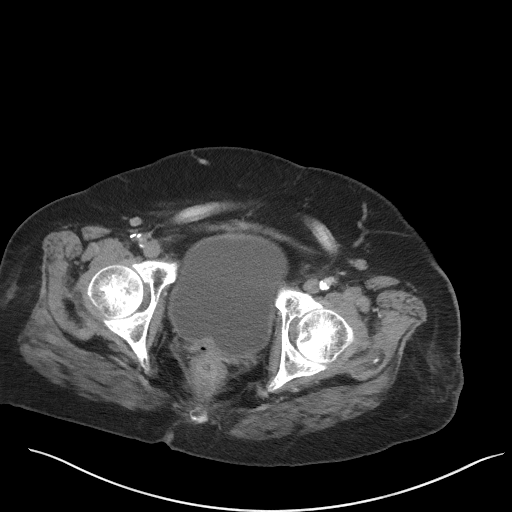
[im 29/97  soft-tissue]
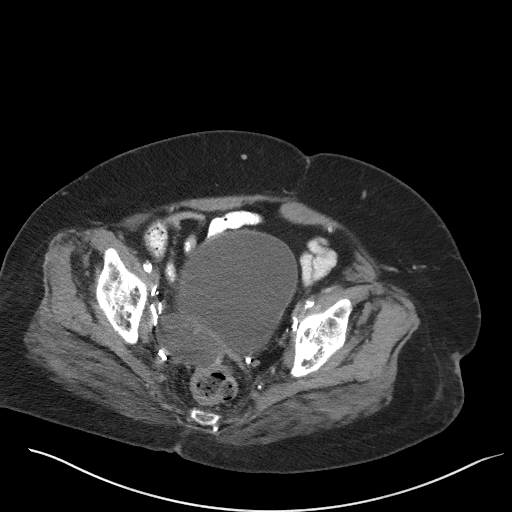
[im 34/97  soft-tissue]
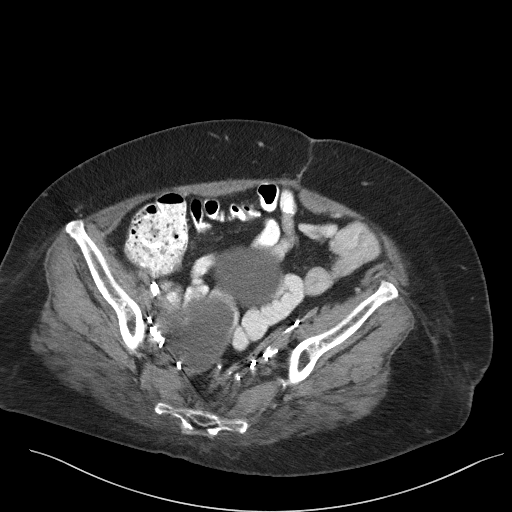
[im 40/97  soft-tissue]
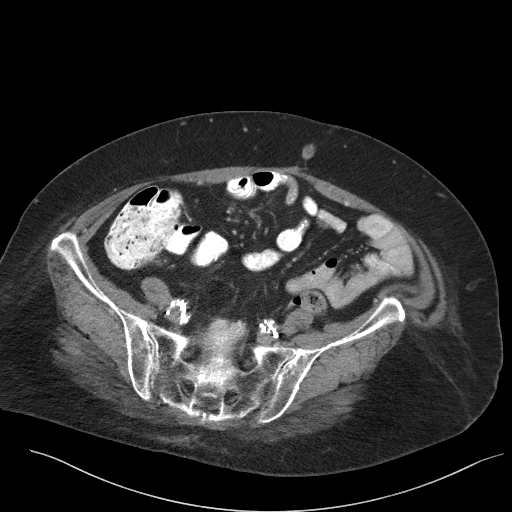
[im 51/97  soft-tissue]
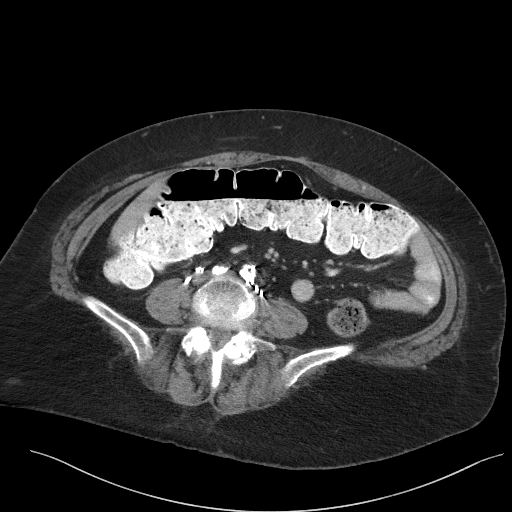
[im 57/97  soft-tissue]
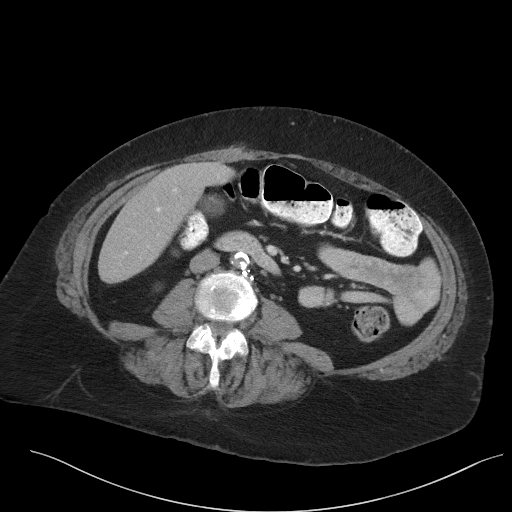
[im 63/97  soft-tissue]
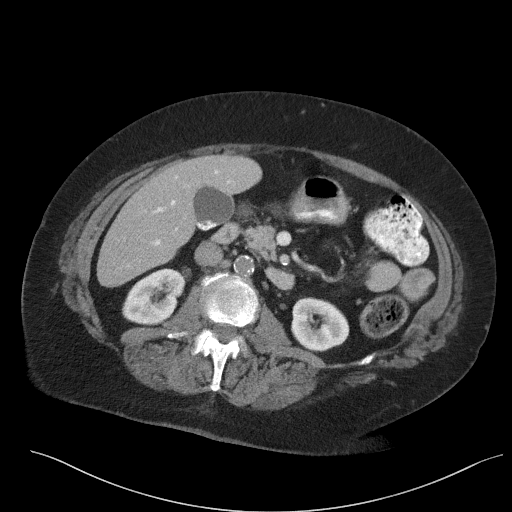
[im 63/97  bone]
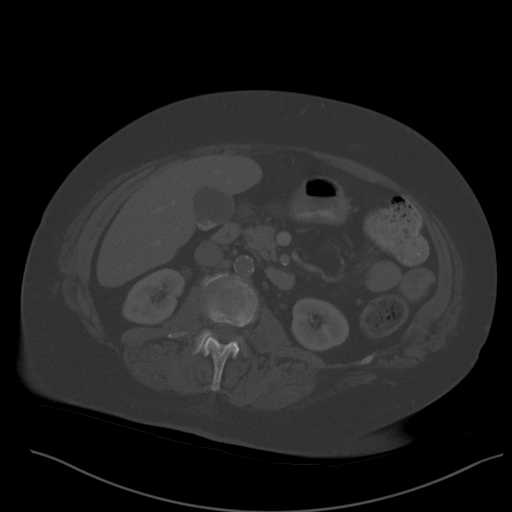
[im 68/97  soft-tissue]
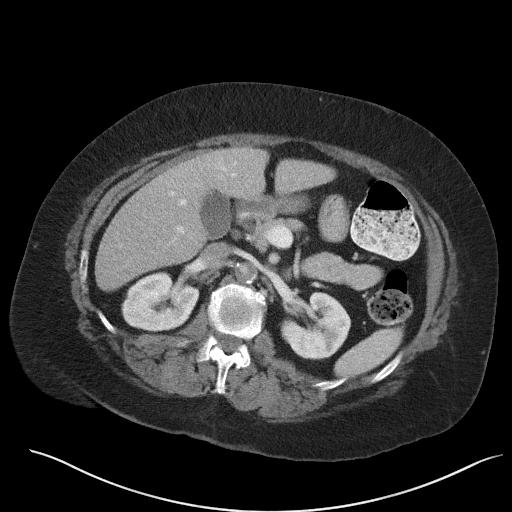
[im 74/97  soft-tissue]
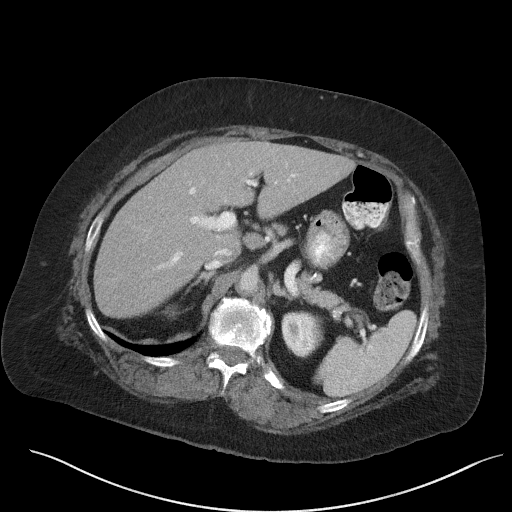
[im 85/97  soft-tissue]
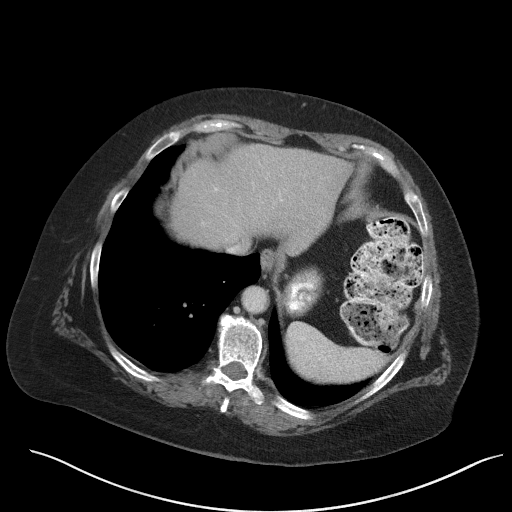
[im 91/97  soft-tissue]
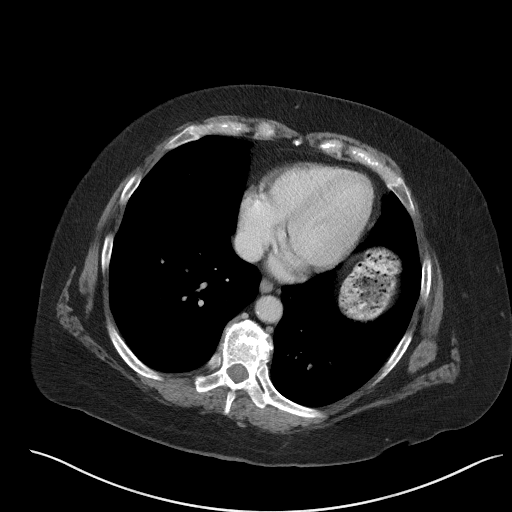

[Series 6: coronal st · coronal · 0.84mm/px · 3 of 117 slices shown]
[im 39/117  soft-tissue]
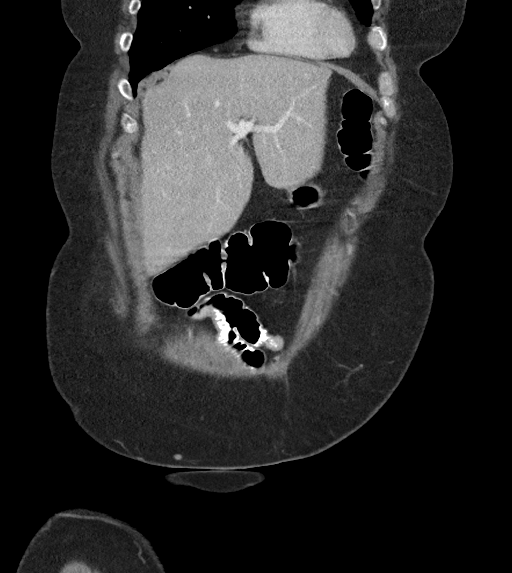
[im 52/117  soft-tissue]
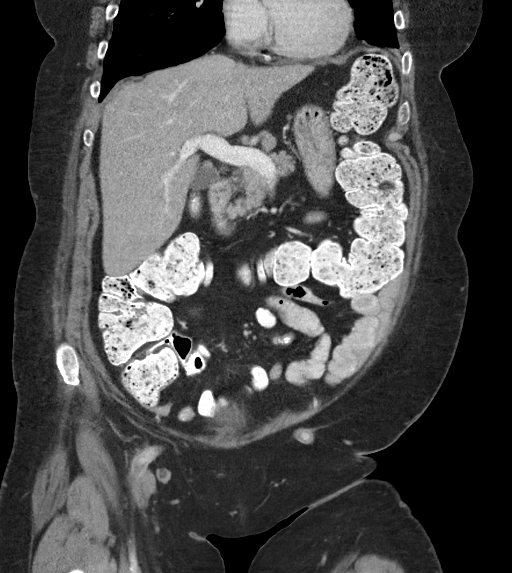
[im 65/117  soft-tissue]
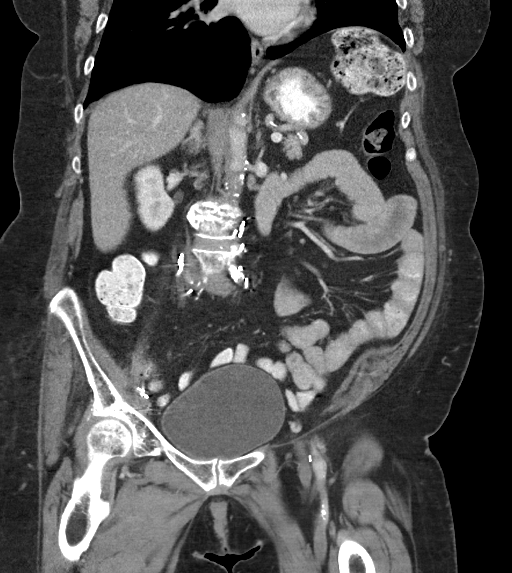

[16 of 46 positions shown; findings below may reference images not displayed]

FINDINGS: Lower chest: No pleural or pericardial effusion. Visualized lung
bases clear.

Hepatobiliary: Multiple partially calcified stones measuring up to 8
mm layer in the dependent aspect of the non dilated gallbladder. No
liver lesion or biliary ductal dilatation.

Pancreas: Unremarkable. No pancreatic ductal dilatation or
surrounding inflammatory changes.

Spleen: Normal in size without focal abnormality.

Adrenals/Urinary Tract: Adrenal glands are unremarkable. Kidneys are
normal, without renal calculi, focal lesion, or hydronephrosis.
Bladder is unremarkable.

Stomach/Bowel: Stomach is incompletely distended. Small bowel
decompressed. Across scratch the appendix not identified. No
pericecal inflammatory change. Moderate proximal colonic fecal
material, decompressed distally.

Vascular/Lymphatic: Infrarenal aortic occlusion, new since previous.
Reconstitution of left common femoral artery with patent fem-fem
bypass graft. Multiple surgical clips from pelvic and
retroperitoneal lymph node dissection. No abdominal or pelvic
adenopathy.

Reproductive: Post hysterectomy.

Other: Enlarging 6.8 x 6.2 x 5.9 complex cystic mass abuts the right
pelvic sidewall (previously 4.3 x 1.8), with some peripheral
calcifications.

No ascites.  No free air.

Musculoskeletal: Small paraumbilical hernia containing only
mesenteric fat. Stable spondylitic changes in the lower thoracic and
lumbar spine. No acute fracture or worrisome bone lesion.
IMPRESSION: 1. Progressive 6.8 cm complex cystic mass (previously 4.3 cm)
adjacent to the right pelvic sidewall.
2. Interval infrarenal aortic occlusion with collateral
reconstitution of left common femoral artery, and patent fem-fem
bypass graft.
3. Cholelithiasis

## 2022-07-27 ENCOUNTER — Other Ambulatory Visit: Payer: Self-pay | Admitting: Cardiovascular Disease

## 2022-09-24 DIAGNOSIS — E559 Vitamin D deficiency, unspecified: Secondary | ICD-10-CM | POA: Diagnosis not present

## 2022-09-24 DIAGNOSIS — R7301 Impaired fasting glucose: Secondary | ICD-10-CM | POA: Diagnosis not present

## 2022-09-24 DIAGNOSIS — E782 Mixed hyperlipidemia: Secondary | ICD-10-CM | POA: Diagnosis not present

## 2022-09-30 DIAGNOSIS — I739 Peripheral vascular disease, unspecified: Secondary | ICD-10-CM | POA: Diagnosis not present

## 2022-09-30 DIAGNOSIS — E875 Hyperkalemia: Secondary | ICD-10-CM | POA: Diagnosis not present

## 2022-09-30 DIAGNOSIS — D72829 Elevated white blood cell count, unspecified: Secondary | ICD-10-CM | POA: Diagnosis not present

## 2022-09-30 DIAGNOSIS — I6529 Occlusion and stenosis of unspecified carotid artery: Secondary | ICD-10-CM | POA: Diagnosis not present

## 2022-10-21 DIAGNOSIS — D72829 Elevated white blood cell count, unspecified: Secondary | ICD-10-CM | POA: Diagnosis not present

## 2022-10-27 ENCOUNTER — Other Ambulatory Visit: Payer: Self-pay

## 2022-10-27 MED ORDER — ROSUVASTATIN CALCIUM 10 MG PO TABS: 10.0000 mg | ORAL_TABLET | Freq: Every day | ORAL | 0 refills | Status: DC

## 2022-11-07 ENCOUNTER — Other Ambulatory Visit: Payer: Self-pay

## 2022-11-07 MED ORDER — EZETIMIBE 10 MG PO TABS: 10.0000 mg | ORAL_TABLET | Freq: Every day | ORAL | 0 refills | Status: DC

## 2022-11-19 DIAGNOSIS — Z1231 Encounter for screening mammogram for malignant neoplasm of breast: Secondary | ICD-10-CM | POA: Diagnosis not present

## 2022-12-02 ENCOUNTER — Telehealth: Payer: Self-pay | Admitting: Cardiovascular Disease

## 2022-12-02 MED ORDER — EZETIMIBE 10 MG PO TABS: 10.0000 mg | ORAL_TABLET | Freq: Every day | ORAL | 1 refills | Status: DC

## 2022-12-02 NOTE — Telephone Encounter (Signed)
*  STAT* If patient is at the pharmacy, call can be transferred to refill team.   1. Which medications need to be refilled? (please list name of each medication and dose if known)ezetimibe (ZETIA) 10 MG tablet   2. Which pharmacy/location (including street and city if local pharmacy) is medication to be sent to?[id number] - [place], [place] - [address]   3. Do they need a 30 day or 90 day supply? 90 Day Supply  Pt is currently out of medication. Patient has an appt schedule.

## 2022-12-02 NOTE — Telephone Encounter (Signed)
Pt's medication was sent to pt's pharmacy as requested. Confirmation received.  °

## 2022-12-30 DIAGNOSIS — B961 Klebsiella pneumoniae [K. pneumoniae] as the cause of diseases classified elsewhere: Secondary | ICD-10-CM | POA: Diagnosis not present

## 2022-12-30 DIAGNOSIS — R338 Other retention of urine: Secondary | ICD-10-CM | POA: Diagnosis not present

## 2022-12-30 DIAGNOSIS — N3000 Acute cystitis without hematuria: Secondary | ICD-10-CM | POA: Diagnosis not present

## 2022-12-30 DIAGNOSIS — N390 Urinary tract infection, site not specified: Secondary | ICD-10-CM | POA: Diagnosis not present

## 2022-12-30 DIAGNOSIS — N130 Hydronephrosis with ureteropelvic junction obstruction: Secondary | ICD-10-CM | POA: Diagnosis not present

## 2023-01-09 ENCOUNTER — Other Ambulatory Visit: Payer: Self-pay

## 2023-01-09 MED ORDER — ROSUVASTATIN CALCIUM 10 MG PO TABS: 10.0000 mg | ORAL_TABLET | Freq: Every day | ORAL | 3 refills | Status: DC

## 2023-02-01 ENCOUNTER — Emergency Department (HOSPITAL_COMMUNITY): Payer: BC Managed Care – PPO

## 2023-02-01 ENCOUNTER — Encounter (HOSPITAL_COMMUNITY): Payer: Self-pay

## 2023-02-01 ENCOUNTER — Emergency Department (HOSPITAL_COMMUNITY)
Admission: EM | Admit: 2023-02-01 | Discharge: 2023-02-01 | Disposition: A | Discharge status: Home / Self Care | Payer: BC Managed Care – PPO | Attending: Emergency Medicine | Admitting: Emergency Medicine

## 2023-02-01 ENCOUNTER — Other Ambulatory Visit: Payer: Self-pay

## 2023-02-01 DIAGNOSIS — Z79899 Other long term (current) drug therapy: Secondary | ICD-10-CM | POA: Diagnosis not present

## 2023-02-01 DIAGNOSIS — M542 Cervicalgia: Secondary | ICD-10-CM | POA: Insufficient documentation

## 2023-02-01 DIAGNOSIS — R197 Diarrhea, unspecified: Secondary | ICD-10-CM | POA: Diagnosis not present

## 2023-02-01 DIAGNOSIS — R519 Headache, unspecified: Secondary | ICD-10-CM | POA: Diagnosis not present

## 2023-02-01 DIAGNOSIS — G43909 Migraine, unspecified, not intractable, without status migrainosus: Secondary | ICD-10-CM | POA: Diagnosis not present

## 2023-02-01 DIAGNOSIS — Z8541 Personal history of malignant neoplasm of cervix uteri: Secondary | ICD-10-CM | POA: Insufficient documentation

## 2023-02-01 DIAGNOSIS — Z7982 Long term (current) use of aspirin: Secondary | ICD-10-CM | POA: Insufficient documentation

## 2023-02-01 DIAGNOSIS — I10 Essential (primary) hypertension: Secondary | ICD-10-CM | POA: Insufficient documentation

## 2023-02-01 DIAGNOSIS — I6782 Cerebral ischemia: Secondary | ICD-10-CM | POA: Diagnosis not present

## 2023-02-01 IMAGING — CT CT HEART MORP W/ CTA COR W/ SCORE W/ CA W/CM &/OR W/O CM
4 of 7 series · 8 of 20 positions shown, 9 images · IV contrast (APPLIED)
Comparison: None.
COMPARISON: None.

Addendum:
EXAM:
OVER-READ INTERPRETATION  CT CHEST

The following report is an over-read performed by radiologist Dr.
Jihad Elfadil [REDACTED] on 08/05/2021. This
over-read does not include interpretation of cardiac or coronary
anatomy or pathology. The coronary CTA interpretation by the
cardiologist is attached.
CLINICAL DATA: Chest pain
Cardiac CTA
MEDICATIONS:
Sub lingual nitro. 4mg x 2
TECHNIQUE: The patient was scanned on a Siemens [REDACTED]ice scanner. Gantry
rotation speed was 250 msecs. Collimation was 0.6 mm. A 100 kV
prospective scan was triggered in the ascending thoracic aorta at
35-75% of the R-R interval. Average HR during the scan was 60 bpm.
The 3D data set was interpreted on a dedicated work station using
MPR, MIP and VRT modes. A total of 80cc of contrast was used.

[Series 6: ts syst sharp · axial · 0.39mm/px · z∈[-363,-330]mm · 2 of 243 slices shown]
[im 81/243  lung]
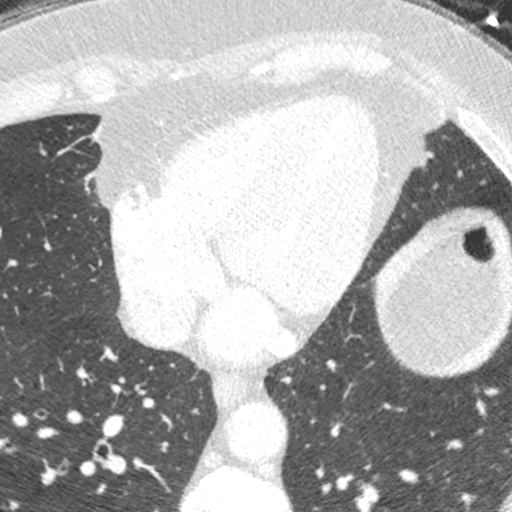
[im 162/243  lung]
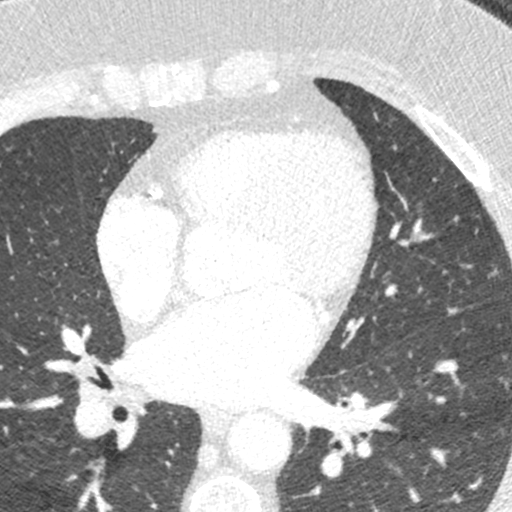

[Series 7: best syst · axial · 0.39mm/px · z∈[-363,-330]mm · 2 of 243 slices shown, 3 images]
[im 81/243  vessel]
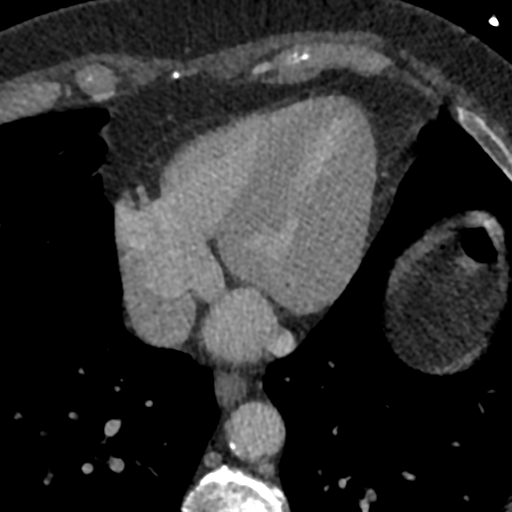
[im 81/243  lung]
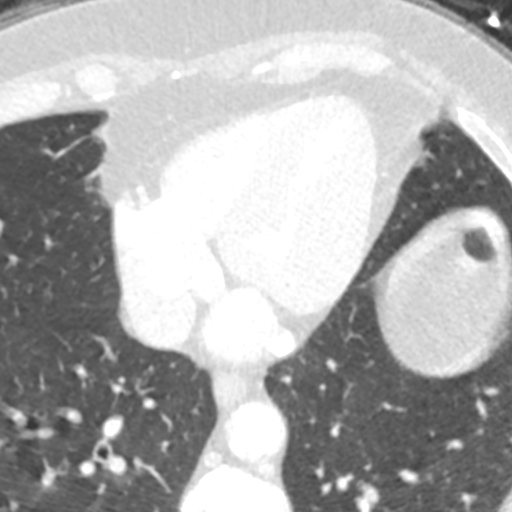
[im 162/243  vessel]
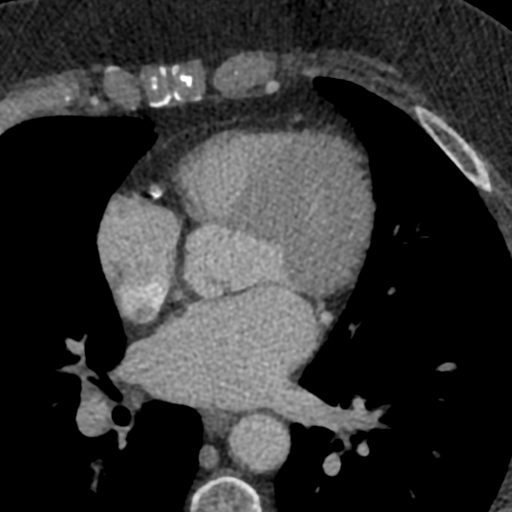

[Series 8: ts diast sharp · axial · 0.39mm/px · z∈[-363,-330]mm · 2 of 243 slices shown]
[im 81/243  lung]
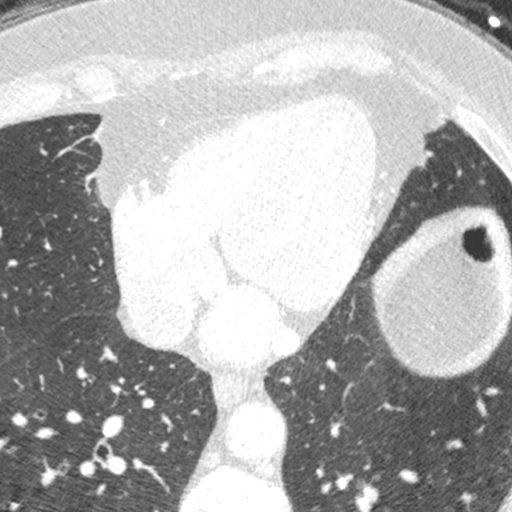
[im 162/243  lung]
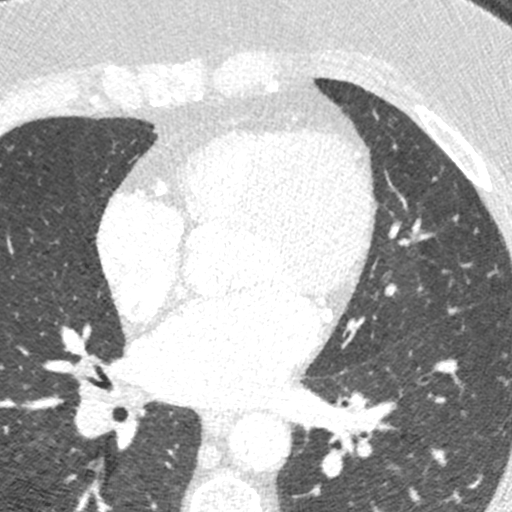

[Series 9: best diast · axial · 0.39mm/px · z∈[-363,-330]mm · 2 of 243 slices shown]
[im 81/243  vessel]
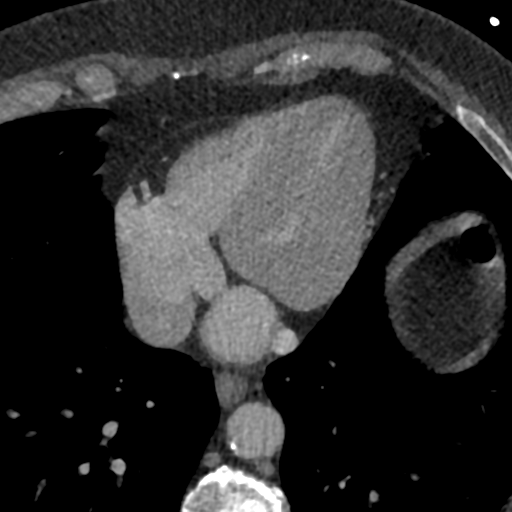
[im 162/243  vessel]
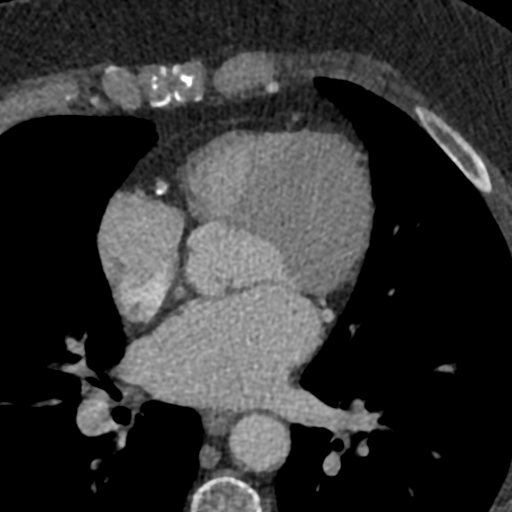

[8 of 20 positions shown; findings below may reference images not displayed]

FINDINGS: Vascular: Atherosclerosis of the thoracic aorta.

Mediastinum/Nodes: Visualized mediastinum and hilar regions
demonstrate no lymphadenopathy or masses.

Lungs/Pleura: There is a triangular shaped subpleural pulmonary
nodule in the lateral right middle lobe measuring approximately 4 mm
on image 20. There is no evidence of pulmonary edema, consolidation,
pneumothorax or pleural fluid.

Upper Abdomen: No acute abnormality.

Musculoskeletal: No chest wall mass or suspicious bone lesions
identified.
IMPRESSION: 1. Atherosclerosis of the thoracic aorta.
2. 4 mm subpleural right middle lobe nodule. No follow-up needed if
patient is low-risk.This recommendation follows the consensus
statement: Guidelines for Management of Incidental Pulmonary Nodules
Detected on CT Images: From the [HOSPITAL] 0205; Radiology
FINDINGS: Non-cardiac: See separate report from [REDACTED].

The contrast bolus was poorly timed, there is not adequate contrast
in the coronaries.

Pulmonary veins drain normally to the left atrium, there is no LA
appendage thrombus.

Calcium Score: 305 Agatston units.

Coronary Arteries: Right dominant with no anomalies

LM: Calcified plaque with minimal stenosis.

LAD system: Mixed plaque proximal LAD with mild (25-49%) stenosis.

Circumflex system: Calcified plaque in the mid LCx. Poor contrast
opacification, probably < 50% stenosis but difficult to quantify.

RCA system: Calcified plaque with mild (25-49%) stenosis in the
proximal RCA and in the distal RCA.
IMPRESSION: 1. Coronary artery calcium score 305 Agatston units. This places the
patient in the 97th percentile for age and gender, suggesting high
risk for future cardiac events.

2.  Poor quality study due to poor contrast opacification.

3. Probably mild disease in the LAD and RCA. Difficult to quantify
the LCx disease but probably not severe. Study does not appear good
enough to send for FFR.

Kintziger Joao

*** End of Addendum ***
EXAM:
OVER-READ INTERPRETATION  CT CHEST

The following report is an over-read performed by radiologist Dr.
Jihad Elfadil [REDACTED] on 08/05/2021. This
over-read does not include interpretation of cardiac or coronary
anatomy or pathology. The coronary CTA interpretation by the
cardiologist is attached.
FINDINGS: Vascular: Atherosclerosis of the thoracic aorta.

Mediastinum/Nodes: Visualized mediastinum and hilar regions
demonstrate no lymphadenopathy or masses.

Lungs/Pleura: There is a triangular shaped subpleural pulmonary
nodule in the lateral right middle lobe measuring approximately 4 mm
on image 20. There is no evidence of pulmonary edema, consolidation,
pneumothorax or pleural fluid.

Upper Abdomen: No acute abnormality.

Musculoskeletal: No chest wall mass or suspicious bone lesions
identified.
IMPRESSION: 1. Atherosclerosis of the thoracic aorta.
2. 4 mm subpleural right middle lobe nodule. No follow-up needed if
patient is low-risk.This recommendation follows the consensus
statement: Guidelines for Management of Incidental Pulmonary Nodules
Detected on CT Images: From the [HOSPITAL] 0205; Radiology

## 2023-02-01 MED ORDER — DIPHENHYDRAMINE HCL 25 MG PO CAPS
25.0000 mg | ORAL_CAPSULE | Freq: Once | ORAL | Status: AC
  Administered 2023-02-01: 25 mg via ORAL
  Filled 2023-02-01: qty 1

## 2023-02-01 MED ORDER — PROCHLORPERAZINE EDISYLATE 10 MG/2ML IJ SOLN
10.0000 mg | Freq: Once | INTRAMUSCULAR | Status: AC
  Administered 2023-02-01: 10 mg via INTRAVENOUS
  Filled 2023-02-01: qty 2

## 2023-02-01 MED ORDER — SODIUM CHLORIDE 0.9 % IV BOLUS
500.0000 mL | Freq: Once | INTRAVENOUS | Status: AC
  Administered 2023-02-01: 500 mL via INTRAVENOUS

## 2023-02-01 MED ORDER — LABETALOL HCL 5 MG/ML IV SOLN
10.0000 mg | Freq: Once | INTRAVENOUS | Status: AC
  Administered 2023-02-01: 10 mg via INTRAVENOUS
  Filled 2023-02-01: qty 4

## 2023-02-01 MED ORDER — KETOROLAC TROMETHAMINE 15 MG/ML IJ SOLN
15.0000 mg | Freq: Once | INTRAMUSCULAR | Status: AC
  Administered 2023-02-01: 15 mg via INTRAVENOUS
  Filled 2023-02-01: qty 1

## 2023-02-01 NOTE — ED Triage Notes (Addendum)
C/o neck pain and headache x2 days with n/v and sensitivity to light and sound.

## 2023-02-01 NOTE — Discharge Instructions (Signed)
Today you were treated for a migraine.  Thank you for letting us treat you today. After reviewing your labs and imaging, I feel you are safe to go home. Please follow up with your PCP in the next several days and provide them with your records from this visit. Return to the Emergency Room if pain becomes severe or symptoms worsen.  Please follow-up with your PCP in the next several days to monitor blood pressure as your blood pressure was high when you came in but was controlled with labetalol.  Please make an appointment with neurology for assessment and treatment for migraine.

## 2023-02-01 NOTE — ED Provider Notes (Addendum)
[place] EMERGENCY DEPARTMENT AT [facility] Provider Note   CSN: [id number] Arrival date & time: 02/01/23  2012     History  Chief Complaint  Patient presents with  . Headache    Cathy Jones is a 62 y.o. female significant past medical history of hypertension, migraines, cervical cancer, peripheral artery disease, hyperlipidemia presents for headache/neck pain x 2 days.  She has a past medical history of migraines but has not had a migraine in over 13 years.  She endorses nausea, vomiting, diarrhea, photophobia and sensitivity to sound.  She denies weakness, chest pain, shortness of breath, fever, sore throat, chills.  She does report this headache feels similar to her previous migraines.   Headache Associated symptoms: diarrhea, nausea, neck pain, photophobia and vomiting   Associated symptoms: no fever, no neck stiffness, no sore throat and no weakness        Home Medications Prior to Admission medications   Medication Sig Start Date End Date Taking? Authorizing Provider  aspirin EC 81 MG tablet Take 81 mg by mouth daily. Swallow whole.    [provider]  carvedilol (COREG) 6.25 MG tablet TAKE 1 TABLET(6.25 MG) BY MOUTH TWICE DAILY 07/31/22   Croitoru, Mihai, MD  ezetimibe (ZETIA) 10 MG tablet Take 1 tablet (10 mg total) by mouth daily. 12/02/22   Croitoru, Mihai, MD  ibuprofen (ADVIL) 200 MG tablet Take 600-800 mg by mouth every 6 (six) hours as needed for moderate pain.    [provider]  phenylephrine (SUDAFED PE) 10 MG TABS tablet Take 10 mg by mouth every 4 (four) hours as needed (allergies).    [provider]  Probiotic Product (PROBIOTIC PO) Take 1 tablet by mouth every evening.    [provider]  rosuvastatin (CRESTOR) 10 MG tablet Take 1 tablet (10 mg total) by mouth daily. 01/09/23   Croitoru, Mihai, MD  Vitamin D, Ergocalciferol, (DRISDOL) 1.25 MG (50000 UNIT) CAPS capsule Take 50,000 Units by mouth every Saturday.  01/02/21   [provider]      Allergies    Codeine    Review of Systems   Review of Systems  Constitutional:  Negative for chills and fever.  HENT:  Negative for sore throat.   Eyes:  Positive for photophobia.  Respiratory:  Negative for shortness of breath.   Cardiovascular:  Negative for chest pain.  Gastrointestinal:  Positive for diarrhea, nausea and vomiting.  Musculoskeletal:  Positive for neck pain. Negative for neck stiffness.  Neurological:  Positive for headaches. Negative for weakness.    Physical Exam Updated Vital Signs BP (!) 154/56   Pulse 71   Temp 99.4 F (37.4 C) (Oral)   Resp 18   Wt 83 kg   SpO2 91%   BMI 33.47 kg/m  Physical Exam Vitals and nursing note reviewed.  Constitutional:      General: She is not in acute distress.    Appearance: She is well-developed.  HENT:     Head: Normocephalic and atraumatic.     Mouth/Throat:     Mouth: Mucous membranes are moist.  Eyes:     Extraocular Movements: Extraocular movements intact.     Right eye: No nystagmus.     Left eye: No nystagmus.     Conjunctiva/sclera: Conjunctivae normal.     Pupils: Pupils are equal, round, and reactive to light.  Neck:     Meningeal: Brudzinski's sign and Kernig's sign absent.  Cardiovascular:     Rate and  Rhythm: Normal rate and regular rhythm.     Heart sounds: No murmur heard. Pulmonary:     Effort: Pulmonary effort is normal. No respiratory distress.     Breath sounds: Normal breath sounds.  Abdominal:     Palpations: Abdomen is soft.     Tenderness: There is no abdominal tenderness.  Musculoskeletal:        General: No swelling.     Cervical back: Neck supple. No rigidity or tenderness. Pain with movement present. Normal range of motion.  Lymphadenopathy:     Cervical: No cervical adenopathy.  Skin:    General: Skin is warm and dry.     Capillary Refill: Capillary refill takes less than 2 seconds.  Neurological:     Mental Status: She is alert  and oriented to person, place, and time.     GCS: GCS eye subscore is 4. GCS verbal subscore is 5. GCS motor subscore is 6.     Sensory: No sensory deficit.     Motor: No weakness.  Psychiatric:        Mood and Affect: Mood normal.    ED Results / Procedures / Treatments   Labs (all labs ordered are listed, but only abnormal results are displayed) Labs Reviewed - No data to display  EKG None  Radiology CT Head Wo Contrast  Result Date: 02/01/2023 CLINICAL DATA:  Head and neck pain for 2 days, nausea, vomiting, and sensitivity to light and sound EXAM: CT HEAD WITHOUT CONTRAST TECHNIQUE: Contiguous axial images were obtained from the base of the skull through the vertex without intravenous contrast. RADIATION DOSE REDUCTION: This exam was performed according to the departmental dose-optimization program which includes automated exposure control, adjustment of the mA and/or kV according to patient size and/or use of iterative reconstruction technique. COMPARISON:  None Available. FINDINGS: Brain: No evidence of acute cortical infarction, hemorrhage, mass, mass effect, or midline shift. No hydrocephalus or extra-axial fluid collection. Hypodensity in right frontal white matter (series 2, image 21), favored to be the sequela of small vessel ischemic disease. Vascular: No hyperdense vessel. Skull: Negative for fracture or focal lesion. Sinuses/Orbits: Mucosal thickening in the ethmoid air cells. No acute finding in the orbits. Other: Trace fluid in left mastoid air cells. IMPRESSION: No acute intracranial process. Electronically Signed   By: Wiliam Ke M.D.   On: 02/01/2023 21:45    Procedures Procedures    Medications Ordered in ED Medications  prochlorperazine (COMPAZINE) injection 10 mg (10 mg Intravenous Given 02/01/23 2054)  diphenhydrAMINE (BENADRYL) capsule 25 mg (25 mg Oral Given 02/01/23 2055)  ketorolac (TORADOL) 15 MG/ML injection 15 mg (15 mg Intravenous Given 02/01/23 2055)   sodium chloride 0.9 % bolus 500 mL (0 mLs Intravenous Stopped 02/01/23 2134)  labetalol (NORMODYNE) injection 10 mg (10 mg Intravenous Given 02/01/23 2151)    ED Course/ Medical Decision Making/ A&P                                 Medical Decision Making Amount and/or Complexity of Data Reviewed Radiology: ordered.  Risk Prescription drug management.  This patient presents to the ED with chief complaint(s) of headache with pertinent past medical history of hyperlipidemia, peripheral artery disease, cervical cancer, migraines  which further complicates the presenting complaint. The complaint involves an extensive differential diagnosis and also carries with it a high risk of complications and morbidity.    The differential diagnosis includes migraine,  cranial bleed  Additional history obtained: Additional history obtained from spouse Records reviewed Primary Care Documents previous vascular office visits and imaging  ED Course and Reassessment:  Patient was given migraine cocktail including Benadryl, Toradol, Compazine and 500 mL normal saline which provided some relief.  Patient's blood pressure was noted to be hypertensive around the 200s/90s and was given 10 mg labetalol which brought her blood pressure down to 154/56.  The patients abnormal results were discussed with the patient and any questions asked were answered to the patients satisfaction. Any follow-up care needed was explained to the patient and the patient expressed understanding.  Independent visualization of imaging: - I independently visualized the following imaging with scope of interpretation limited to determining acute life threatening conditions related to emergency care: CT head without contrast, which revealed no acute intracranial processes.  Consultation: - Consulted or discussed management/test interpretation w/ external professional: None  Consideration for admission or further workup: Follow-up with PCP  for hypertension monitoring.  Establish care with neurology for assessment and treatment for migraine         Final Clinical Impression(s) / ED Diagnoses Final diagnoses:  Migraine without status migrainosus, not intractable, unspecified migraine type    Rx / DC Orders ED Discharge Orders     None         Gretta Began 02/01/23 2254    Eber Hong, MD 02/13/23 1252    Dolphus Jenny, PA-C 03/05/23 2038    Eber Hong, MD 03/08/23 1410

## 2023-02-04 DIAGNOSIS — J0100 Acute maxillary sinusitis, unspecified: Secondary | ICD-10-CM | POA: Diagnosis not present

## 2023-02-04 DIAGNOSIS — H748X9 Other specified disorders of middle ear and mastoid, unspecified ear: Secondary | ICD-10-CM | POA: Diagnosis not present

## 2023-02-04 DIAGNOSIS — H9209 Otalgia, unspecified ear: Secondary | ICD-10-CM | POA: Diagnosis not present

## 2023-02-16 ENCOUNTER — Emergency Department (HOSPITAL_COMMUNITY): Payer: BC Managed Care – PPO

## 2023-02-16 ENCOUNTER — Other Ambulatory Visit: Payer: Self-pay

## 2023-02-16 ENCOUNTER — Ambulatory Visit
Admission: EM | Admit: 2023-02-16 | Discharge: 2023-02-16 | Disposition: A | Discharge status: Home / Self Care | Payer: BC Managed Care – PPO | Attending: Nurse Practitioner | Admitting: Nurse Practitioner

## 2023-02-16 ENCOUNTER — Inpatient Hospital Stay (HOSPITAL_COMMUNITY)
Admission: EM | Admit: 2023-02-16 | Discharge: 2023-02-19 | DRG: 280 | Disposition: A | Discharge status: Home / Self Care | Payer: BC Managed Care – PPO | Attending: Family Medicine | Admitting: Family Medicine

## 2023-02-16 ENCOUNTER — Encounter (HOSPITAL_COMMUNITY): Payer: Self-pay | Admitting: *Deleted

## 2023-02-16 DIAGNOSIS — R0682 Tachypnea, not elsewhere classified: Secondary | ICD-10-CM | POA: Diagnosis present

## 2023-02-16 DIAGNOSIS — J810 Acute pulmonary edema: Secondary | ICD-10-CM | POA: Insufficient documentation

## 2023-02-16 DIAGNOSIS — G44209 Tension-type headache, unspecified, not intractable: Secondary | ICD-10-CM

## 2023-02-16 DIAGNOSIS — I161 Hypertensive emergency: Principal | ICD-10-CM | POA: Diagnosis present

## 2023-02-16 DIAGNOSIS — E782 Mixed hyperlipidemia: Secondary | ICD-10-CM | POA: Diagnosis present

## 2023-02-16 DIAGNOSIS — J90 Pleural effusion, not elsewhere classified: Secondary | ICD-10-CM | POA: Diagnosis not present

## 2023-02-16 DIAGNOSIS — Z860100 Personal history of colon polyps, unspecified: Secondary | ICD-10-CM

## 2023-02-16 DIAGNOSIS — Z9851 Tubal ligation status: Secondary | ICD-10-CM

## 2023-02-16 DIAGNOSIS — I110 Hypertensive heart disease with heart failure: Principal | ICD-10-CM | POA: Diagnosis present

## 2023-02-16 DIAGNOSIS — Z923 Personal history of irradiation: Secondary | ICD-10-CM | POA: Diagnosis not present

## 2023-02-16 DIAGNOSIS — Z79899 Other long term (current) drug therapy: Secondary | ICD-10-CM | POA: Diagnosis not present

## 2023-02-16 DIAGNOSIS — J9601 Acute respiratory failure with hypoxia: Secondary | ICD-10-CM | POA: Diagnosis not present

## 2023-02-16 DIAGNOSIS — F17210 Nicotine dependence, cigarettes, uncomplicated: Secondary | ICD-10-CM | POA: Diagnosis not present

## 2023-02-16 DIAGNOSIS — D72829 Elevated white blood cell count, unspecified: Secondary | ICD-10-CM | POA: Diagnosis not present

## 2023-02-16 DIAGNOSIS — Z9221 Personal history of antineoplastic chemotherapy: Secondary | ICD-10-CM | POA: Diagnosis not present

## 2023-02-16 DIAGNOSIS — R000 Tachycardia, unspecified: Secondary | ICD-10-CM | POA: Diagnosis present

## 2023-02-16 DIAGNOSIS — I10 Essential (primary) hypertension: Secondary | ICD-10-CM | POA: Diagnosis not present

## 2023-02-16 DIAGNOSIS — R519 Headache, unspecified: Secondary | ICD-10-CM | POA: Diagnosis not present

## 2023-02-16 DIAGNOSIS — F419 Anxiety disorder, unspecified: Secondary | ICD-10-CM | POA: Diagnosis present

## 2023-02-16 DIAGNOSIS — Z800 Family history of malignant neoplasm of digestive organs: Secondary | ICD-10-CM

## 2023-02-16 DIAGNOSIS — I5031 Acute diastolic (congestive) heart failure: Secondary | ICD-10-CM | POA: Diagnosis present

## 2023-02-16 DIAGNOSIS — I160 Hypertensive urgency: Secondary | ICD-10-CM | POA: Diagnosis not present

## 2023-02-16 DIAGNOSIS — Z6831 Body mass index (BMI) 31.0-31.9, adult: Secondary | ICD-10-CM | POA: Diagnosis not present

## 2023-02-16 DIAGNOSIS — I2489 Other forms of acute ischemic heart disease: Secondary | ICD-10-CM | POA: Diagnosis present

## 2023-02-16 DIAGNOSIS — E66811 Obesity, class 1: Secondary | ICD-10-CM | POA: Diagnosis present

## 2023-02-16 DIAGNOSIS — F32A Depression, unspecified: Secondary | ICD-10-CM | POA: Diagnosis present

## 2023-02-16 DIAGNOSIS — R7989 Other specified abnormal findings of blood chemistry: Secondary | ICD-10-CM | POA: Diagnosis not present

## 2023-02-16 DIAGNOSIS — R0902 Hypoxemia: Secondary | ICD-10-CM

## 2023-02-16 DIAGNOSIS — Z885 Allergy status to narcotic agent status: Secondary | ICD-10-CM

## 2023-02-16 DIAGNOSIS — K219 Gastro-esophageal reflux disease without esophagitis: Secondary | ICD-10-CM | POA: Diagnosis present

## 2023-02-16 DIAGNOSIS — I739 Peripheral vascular disease, unspecified: Secondary | ICD-10-CM | POA: Diagnosis present

## 2023-02-16 DIAGNOSIS — R0602 Shortness of breath: Secondary | ICD-10-CM | POA: Diagnosis not present

## 2023-02-16 DIAGNOSIS — R918 Other nonspecific abnormal finding of lung field: Secondary | ICD-10-CM | POA: Diagnosis not present

## 2023-02-16 DIAGNOSIS — Z8541 Personal history of malignant neoplasm of cervix uteri: Secondary | ICD-10-CM

## 2023-02-16 DIAGNOSIS — E669 Obesity, unspecified: Secondary | ICD-10-CM | POA: Diagnosis not present

## 2023-02-16 DIAGNOSIS — G43909 Migraine, unspecified, not intractable, without status migrainosus: Secondary | ICD-10-CM | POA: Diagnosis present

## 2023-02-16 DIAGNOSIS — Z803 Family history of malignant neoplasm of breast: Secondary | ICD-10-CM

## 2023-02-16 DIAGNOSIS — I21A1 Myocardial infarction type 2: Secondary | ICD-10-CM | POA: Diagnosis not present

## 2023-02-16 DIAGNOSIS — Z9862 Peripheral vascular angioplasty status: Secondary | ICD-10-CM

## 2023-02-16 DIAGNOSIS — I6782 Cerebral ischemia: Secondary | ICD-10-CM | POA: Diagnosis not present

## 2023-02-16 DIAGNOSIS — J45909 Unspecified asthma, uncomplicated: Secondary | ICD-10-CM | POA: Diagnosis not present

## 2023-02-16 DIAGNOSIS — Z7982 Long term (current) use of aspirin: Secondary | ICD-10-CM

## 2023-02-16 DIAGNOSIS — Z8249 Family history of ischemic heart disease and other diseases of the circulatory system: Secondary | ICD-10-CM | POA: Diagnosis not present

## 2023-02-16 DIAGNOSIS — R778 Other specified abnormalities of plasma proteins: Secondary | ICD-10-CM | POA: Diagnosis not present

## 2023-02-16 DIAGNOSIS — Z90710 Acquired absence of both cervix and uterus: Secondary | ICD-10-CM

## 2023-02-16 DIAGNOSIS — I509 Heart failure, unspecified: Secondary | ICD-10-CM | POA: Diagnosis not present

## 2023-02-16 DIAGNOSIS — I2510 Atherosclerotic heart disease of native coronary artery without angina pectoris: Secondary | ICD-10-CM | POA: Diagnosis present

## 2023-02-16 DIAGNOSIS — J40 Bronchitis, not specified as acute or chronic: Secondary | ICD-10-CM | POA: Diagnosis present

## 2023-02-16 DIAGNOSIS — T380X5A Adverse effect of glucocorticoids and synthetic analogues, initial encounter: Secondary | ICD-10-CM | POA: Diagnosis present

## 2023-02-16 LAB — BASIC METABOLIC PANEL
Anion gap: 10 (ref 5–15)
BUN: 19 mg/dL (ref 8–23)
CO2: 26 mmol/L (ref 22–32)
Calcium: 8.6 mg/dL — ABNORMAL LOW (ref 8.9–10.3)
Chloride: 97 mmol/L — ABNORMAL LOW (ref 98–111)
Creatinine, Ser: 1.07 mg/dL — ABNORMAL HIGH (ref 0.44–1.00)
GFR, Estimated: 59 mL/min — ABNORMAL LOW (ref >60)
Glucose, Bld: 124 mg/dL — ABNORMAL HIGH (ref 70–99)
Potassium: 3.8 mmol/L (ref 3.5–5.1)
Sodium: 133 mmol/L — ABNORMAL LOW (ref 135–145)

## 2023-02-16 LAB — CBC WITH DIFFERENTIAL/PLATELET
Abs Immature Granulocytes: 0 10*3/uL (ref 0.00–0.07)
Basophils Absolute: 0 10*3/uL (ref 0.0–0.1)
Basophils Relative: 0 %
Eosinophils Absolute: 0.3 10*3/uL (ref 0.0–0.5)
Eosinophils Relative: 1 %
HCT: 49.5 % — ABNORMAL HIGH (ref 36.0–46.0)
Hemoglobin: 15.4 g/dL — ABNORMAL HIGH (ref 12.0–15.0)
Lymphocytes Relative: 24 %
Lymphs Abs: 7 10*3/uL — ABNORMAL HIGH (ref 0.7–4.0)
MCH: 26.3 pg (ref 26.0–34.0)
MCHC: 31.1 g/dL (ref 30.0–36.0)
MCV: 84.6 fL (ref 80.0–100.0)
Monocytes Absolute: 1.8 10*3/uL — ABNORMAL HIGH (ref 0.1–1.0)
Monocytes Relative: 6 %
Neutro Abs: 20.2 10*3/uL — ABNORMAL HIGH (ref 1.7–7.7)
Neutrophils Relative %: 69 %
Platelets: 327 10*3/uL (ref 150–400)
RBC: 5.85 MIL/uL — ABNORMAL HIGH (ref 3.87–5.11)
RDW: 13.8 % (ref 11.5–15.5)
WBC: 29.3 10*3/uL — ABNORMAL HIGH (ref 4.0–10.5)
nRBC: 0 % (ref 0.0–0.2)

## 2023-02-16 LAB — TROPONIN I (HIGH SENSITIVITY)
Troponin I (High Sensitivity): 221 ng/L (ref <18)
Troponin I (High Sensitivity): 354 ng/L (ref <18)
Troponin I (High Sensitivity): 225 ng/L (ref <18)
Troponin I (High Sensitivity): 187 ng/L (ref <18)

## 2023-02-16 LAB — BRAIN NATRIURETIC PEPTIDE: B Natriuretic Peptide: 967 pg/mL — ABNORMAL HIGH (ref 0.0–100.0)

## 2023-02-16 MED ORDER — KETOROLAC TROMETHAMINE 15 MG/ML IJ SOLN
15.0000 mg | Freq: Once | INTRAMUSCULAR | Status: AC
  Administered 2023-02-16: 15 mg via INTRAVENOUS
  Filled 2023-02-16: qty 1

## 2023-02-16 MED ORDER — LABETALOL HCL 5 MG/ML IV SOLN
20.0000 mg | Freq: Once | INTRAVENOUS | Status: AC
  Administered 2023-02-16: 20 mg via INTRAVENOUS
  Filled 2023-02-16: qty 4

## 2023-02-16 MED ORDER — IPRATROPIUM-ALBUTEROL 0.5-2.5 (3) MG/3ML IN SOLN
3.0000 mL | Freq: Once | RESPIRATORY_TRACT | Status: AC
  Administered 2023-02-16: 3 mL via RESPIRATORY_TRACT

## 2023-02-16 MED ORDER — LABETALOL HCL 5 MG/ML IV SOLN
10.0000 mg | Freq: Once | INTRAVENOUS | Status: AC
  Administered 2023-02-16: 10 mg via INTRAVENOUS
  Filled 2023-02-16: qty 4

## 2023-02-16 MED ORDER — DIPHENHYDRAMINE HCL 50 MG/ML IJ SOLN
12.5000 mg | Freq: Once | INTRAMUSCULAR | Status: AC
  Administered 2023-02-16: 12.5 mg via INTRAVENOUS
  Filled 2023-02-16: qty 1

## 2023-02-16 MED ORDER — FENTANYL CITRATE PF 50 MCG/ML IJ SOSY
50.0000 ug | PREFILLED_SYRINGE | Freq: Once | INTRAMUSCULAR | Status: AC
  Administered 2023-02-16: 50 ug via INTRAVENOUS
  Filled 2023-02-16: qty 1

## 2023-02-16 MED ORDER — LACTATED RINGERS IV BOLUS: 250.0000 mL | Freq: Once | INTRAVENOUS | Status: DC

## 2023-02-16 MED ORDER — NITROGLYCERIN IN D5W 200-5 MCG/ML-% IV SOLN
0.0000 ug/min | INTRAVENOUS | Status: DC
  Administered 2023-02-16: 50 ug/min via INTRAVENOUS
  Administered 2023-02-17: 75 ug/min via INTRAVENOUS
  Filled 2023-02-16 (×2): qty 250

## 2023-02-16 MED ORDER — IOHEXOL 350 MG/ML SOLN
75.0000 mL | Freq: Once | INTRAVENOUS | Status: AC | PRN
  Administered 2023-02-16: 75 mL via INTRAVENOUS

## 2023-02-16 MED ORDER — ASPIRIN 81 MG PO CHEW
324.0000 mg | CHEWABLE_TABLET | Freq: Once | ORAL | Status: AC
  Administered 2023-02-16: 324 mg via ORAL
  Filled 2023-02-16: qty 4

## 2023-02-16 MED ORDER — METOCLOPRAMIDE HCL 5 MG/ML IJ SOLN
10.0000 mg | Freq: Once | INTRAMUSCULAR | Status: AC
  Administered 2023-02-16: 10 mg via INTRAVENOUS
  Filled 2023-02-16: qty 2

## 2023-02-16 MED ORDER — ACETAMINOPHEN 325 MG PO TABS
650.0000 mg | ORAL_TABLET | Freq: Once | ORAL | Status: AC
  Administered 2023-02-16: 650 mg via ORAL
  Filled 2023-02-16: qty 2

## 2023-02-16 MED ORDER — HYDRALAZINE HCL 20 MG/ML IJ SOLN
5.0000 mg | Freq: Once | INTRAMUSCULAR | Status: AC
  Administered 2023-02-16: 5 mg via INTRAVENOUS
  Filled 2023-02-16: qty 1

## 2023-02-16 NOTE — ED Provider Notes (Signed)
[place] EMERGENCY DEPARTMENT AT [facility] Provider Note   CSN: [id number] Arrival date & time: 02/16/23  1257     History {Add pertinent medical, surgical, social history, OB history to HPI:1} Chief Complaint  Patient presents with   Shortness of Breath    Cathy Jones is a 62 y.o. female with PMH as listed below who presents with cough, SOB, headache, CP.  Reports upper respiratory sxs x 3 weeks including cough. Was recently seen/diagnosed w/ bronchitis, given steroids. States her BP has previously been well-controlled on      Past Medical History:  Diagnosis Date   Acute renal failure (ARF) (HCC) 08/07/2015   Allergy    Anemia    Anxiety    Asthma    pt brought her proair inhaler 06-06-14   Cancer (HCC)    cervical   Colon polyp    hyperplastic   Colon stricture (HCC)    Colonic obstruction (HCC)    Depression    Family history of adverse reaction to anesthesia    pts mother experiences nausea and vomiting    GERD (gastroesophageal reflux disease)    History of bronchitis    History of cervical cancer    Stage IB adenocarcinoma of the cervix, treated with rad hyst, 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 Authorizing Provider  aspirin EC 81 MG tablet Take 81 mg by mouth daily. Swallow whole.    [provider]  carvedilol (COREG) 6.25 MG tablet TAKE 1 TABLET(6.25 MG) BY MOUTH TWICE DAILY 07/31/22   Croitoru, Mihai, MD  ezetimibe (ZETIA) 10 MG tablet Take 1 tablet (10 mg total) by mouth daily. 12/02/22   Croitoru, Mihai, MD  ibuprofen (ADVIL) 200 MG tablet Take 600-800 mg by mouth every 6  (six) hours as needed for moderate pain.    [provider]  phenylephrine (SUDAFED PE) 10 MG TABS tablet Take 10 mg by mouth every 4 (four) hours as needed (allergies).    [provider]  Probiotic Product (PROBIOTIC PO) Take 1 tablet by mouth every evening.    [provider]  rosuvastatin (CRESTOR) 10 MG tablet Take 1 tablet (10 mg total) by mouth daily. 01/09/23   Croitoru, Mihai, MD  Vitamin D, Ergocalciferol, (DRISDOL) 1.25 MG (50000 UNIT) CAPS capsule Take 50,000 Units by mouth every Saturday. 01/02/21   [provider]      Allergies    Codeine    Review of Systems   Review of Systems A 10 point review of systems was performed and is negative unless otherwise reported in HPI.  Physical Exam Updated Vital Signs BP (!) 234/99   Pulse 100   Temp 98.1 F (36.7 C) (Oral)   Resp 20   Ht 5\' 2"  (1.575 m)   Wt 77.6 kg   SpO2 93%   BMI 31.28 kg/m  Physical Exam General: Normal appearing {Desc; female/female:11659}, lying in bed.  HEENT: PERRLA, Sclera anicteric, MMM, trachea midline.  Cardiology: RRR, no murmurs/rubs/gallops. BL radial and DP pulses equal bilaterally.  Resp: Normal respiratory rate and effort. CTAB, no wheezes, rhonchi, crackles.  Abd: Soft, non-tender, non-distended. No rebound tenderness or guarding.  GU: Deferred. MSK: No peripheral edema or signs of trauma. Extremities without deformity or TTP. No cyanosis or clubbing. Skin: warm, dry. No rashes or lesions. Back: No CVA tenderness Neuro: A&Ox4, CNs II-XII grossly intact. MAEs. Sensation grossly intact.  Psych: Normal mood and affect.   ED Results / Procedures / Treatments   Labs (all labs ordered are listed, but only abnormal results are displayed) Labs Reviewed  CBC WITH DIFFERENTIAL/PLATELET - Abnormal; Notable for the following components:      Result Value   WBC 29.3 (*)    RBC 5.85 (*)    Hemoglobin 15.4 (*)    HCT 49.5 (*)    Neutro Abs 20.2 (*)    Lymphs Abs  7.0 (*)    Monocytes Absolute 1.8 (*)    All other components within normal limits  BASIC METABOLIC PANEL - Abnormal; Notable for the following components:   Sodium 133 (*)    Chloride 97 (*)    Glucose, Bld 124 (*)    Creatinine, Ser 1.07 (*)    Calcium 8.6 (*)    GFR, Estimated 59 (*)    All other components within normal limits  TROPONIN I (HIGH SENSITIVITY) - Abnormal; Notable for the following components:   Troponin I (High Sensitivity) 221 (*)    All other components within normal limits  BRAIN NATRIURETIC PEPTIDE  TROPONIN I (HIGH SENSITIVITY)    EKG EKG Interpretation Date/Time:  Monday February 16 2023 13:12:13 EDT Ventricular Rate:  97 PR Interval:  140 QRS Duration:  64 QT Interval:  346 QTC Calculation: 439 R Axis:   67  Text Interpretation: Normal sinus rhythm Anteroseptal infarct , age undetermined  No significant change from priors Confirmed by Vivi Barrack [phone]) on 02/16/2023 2:05:18 PM  Radiology No results found.  Procedures Procedures  {Document cardiac monitor, telemetry assessment procedure when appropriate:1}  Medications Ordered in ED Medications  hydrALAZINE (APRESOLINE) injection 5 mg (5 mg Intravenous Given 02/16/23 1443)    ED Course/ Medical Decision Making/ A&P                          Medical Decision Making Amount and/or Complexity of Data Reviewed Labs: ordered. Radiology: ordered.  Risk Prescription drug management.    This patient presents to the ED for concern of ***, this involves an extensive number of treatment options, and is a complaint that carries with it a high risk of complications and morbidity.  I considered the following differential and admission for this acute, potentially life threatening condition.   MDM:    Patient was pulled from waiting room when her troponin resulted at 221. She has no active chest pain. However, she does have headache, and her BP is 234/99. I suspect her troponin elevation is likely due  to hypertensive emergency as opposed to ACS. Will treat w/ IV hydralazine and reassess.   Clinical Course as of 02/16/23 1459  Mon Feb 16, 2023  1459 WBC(!): 29.3 Has been on steroids for cough/bronchitis [HN]    Clinical Course User Index [HN] Loetta Rough, MD    Labs: I Ordered, and personally interpreted labs.  The pertinent results include:  ***  Imaging Studies ordered: I ordered imaging studies including *** I independently visualized and interpreted imaging. I agree with the radiologist interpretation  Additional history obtained from ***.  External records from outside source obtained and reviewed including ***  Cardiac Monitoring: The patient was maintained on a cardiac monitor.  I personally viewed and interpreted the cardiac monitored which showed an underlying rhythm of: ***  Reevaluation: After the interventions noted above, I reevaluated the patient and found that they have :{resolved/improved/worsened:23923::"improved"}  Social Determinants of Health: ***  Disposition:  ***  Co morbidities that complicate the patient evaluation  Past Medical History:  Diagnosis Date   Acute renal failure (ARF) (HCC) 08/07/2015   Allergy    Anemia    Anxiety    Asthma    pt brought her proair inhaler 06-06-14   Cancer (HCC)    cervical   Colon polyp    hyperplastic   Colon stricture (HCC)    Colonic obstruction (HCC)    Depression    Family history of adverse reaction to anesthesia    pts mother experiences nausea and vomiting    GERD (gastroesophageal reflux disease)    History of bronchitis    History of cervical cancer    Stage IB adenocarcinoma of the cervix, treated with rad hyst, 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   History of chemotherapy    History of hiatal hernia    History of radiation therapy    Hyperlipidemia    Hypertension    Iliac artery occlusion, 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   Migraine    "q couple years maybe" (08/07/2015)   Shortness of breath dyspnea     seasonal      Medicines Meds ordered this encounter  Medications   hydrALAZINE (APRESOLINE) injection 5 mg    I have reviewed the patients home medicines and have made adjustments as needed  Problem List / ED Course: Problem List Items Addressed This Visit   None        {Document critical care time when appropriate:1} {Document review of labs and clinical decision tools ie heart score, Chads2Vasc2 etc:1}  {Document your independent review of radiology images, and any outside records:1} {Document your discussion with family members, caretakers, and with consultants:1} {Document social determinants of health affecting pt's care:1} {Document your decision making why or why not admission, treatments were needed:1}  This note was created using dictation software, which may contain spelling or grammatical errors.

## 2023-02-16 NOTE — ED Notes (Signed)
Resting/ sleeping comfortably, NAD, calm, interactive. No increased wob. Denies CP. C/o HA, rates 4/10.

## 2023-02-16 NOTE — ED Triage Notes (Signed)
Pt with SOB x 3 weeks.  Pt seen UC and sent here.  + CP on right and has moved to left off and on, worse for past 3 days. Pt has been on antibiotics and steroids without relief.

## 2023-02-16 NOTE — H&P (Incomplete)
History and Physical    Patient: Cathy Jones DOB: 02/13/1961 DOA: 02/16/2023 DOS: the patient was seen and examined on 02/16/2023 PCP: Benita Stabile, MD  Patient coming from: Home  Chief Complaint:  Chief Complaint  Patient presents with  . Shortness of Breath   HPI: Cathy Jones is a 62 y.o. female with medical history significant of hypertension, hyperlipidemia who presents to the emergency department due to 2 to 3-week onset of shortness of breath associated with cough, patient was recently diagnosed with bronchitis and was treated with steroids, symptoms momentarily improved, then worsened within the last few days.  This morning, she had increased shortness of breath and elevated blood pressure which was associated with headache treated and patient was taken to the ED for further evaluation and management.  ED Course:  In the emergency department, she was tachypneic, tachycardic, BP was 221/109, temperature was 90 7.71F and O2 sat was 90% on room air.  Workup in the ED showed WBC 29.3, hemoglobin 15.4, hematocrit 49.5, MCV 84.6, platelets 327.  BMP was normal except for sodium of 133, chloride 97, blood glucose 124 and creatinine of 1.07.  Troponin 221 > 354 > 225 > 187.  BNP 967 Chest x-ray showed  bilateral mild interstitial thickening which may reflect mild interstitial edema versus interstitial infection. CT head without contrast showed no acute intracranial process CT angiography chest with contrast showed no evidence of pulmonary embolus, but showed small bilateral pleural effusion with bronchial wall and septal thickening, likely due to pulmonary edema. As respiratory for milligram x 1 was given, pain medication was provided, hydralazine and labetalol were given, but BP continues to stay elevated and patient was started on IV nitroglycerin. Cardiologist on-call (Dr. Diona Browner) was consulted and recommended continue treatment for hypertensive emergency and to admit  patient to [facility] in the event that she may need a heart cath. Hospitalist was asked admit patient for further evaluation and management.  Review of Systems: Review of systems as noted in the HPI. All other systems reviewed and are negative.   Past Medical History:  Diagnosis Date  . Acute renal failure (ARF) (HCC) 08/07/2015  . Allergy   . Anemia   . Anxiety   . Asthma    pt brought her proair inhaler 06-06-14  . Cancer (HCC)    cervical  . Colon polyp    hyperplastic  . Colon stricture (HCC)   . Colonic obstruction (HCC)   . Depression   . Family history of adverse reaction to anesthesia    pts mother experiences nausea and vomiting   . GERD (gastroesophageal reflux disease)   . History of bronchitis   . History of cervical cancer    Stage IB adenocarcinoma of the cervix, treated with rad hyst, 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  . History of chemotherapy   . History of hiatal hernia   . History of radiation therapy   . Hyperlipidemia   . Hypertension   . 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 Migraine    "q couple years maybe" (08/07/2015)  . Shortness of breath dyspnea    seasonal    Past Surgical History:  Procedure Laterality Date  . ABDOMINAL AORTOGRAM W/LOWER EXTREMITY Bilateral 07/26/2021   Procedure: ABDOMINAL AORTOGRAM W/LOWER EXTREMITY;  Surgeon: Leonie Douglas, MD;  [facility]: [facility] INVAS[facility] LAB;  Service: Cardiovascular;  Laterality: Bilateral;  . AXILLARY-FEMORAL BYPASS GRAFT Right 08/26/2021   Procedure: RIGHT AXILLO-BIFEMORAL BYPASS WITH GORETEC GRAFT;  Surgeon: Leonie Douglas, MD;  Location[facility] OR;  Service: Vascular;  Laterality: Right;  right axilla to right groin, right groin to left groin  . COLON RESECTION N/A 06/27/2015   Procedure: LYSIS OF ADHESIONS LOW ANTERIOR RESECTION DIVERTING LOOP ILEOSTOMY;  Surgeon: Romie Levee, MD;  Location: [facility] ORS;  Service: General;  Laterality: N/A;  . COLON SURGERY    . COLONOSCOPY    . CYSTOSCOPY WITH  STENT PLACEMENT Bilateral 06/27/2015   Procedure: CYSTOSCOPY WITH CATHETER  PLACEMENT;  Surgeon: Marcine Matar, MD;  Location: [facility] ORS;  Service: Urology;  Laterality: Bilateral;  . DIVERTING ILEOSTOMY N/A 06/27/2015   Procedure: DIVERTING LOOP ILEOSTOMY;  Surgeon: Romie Levee, MD;  Location: [facility] ORS;  Service: General;  Laterality: N/A;  . FEMORAL-FEMORAL BYPASS GRAFT Bilateral 08/10/2015   Procedure: BYPASS GRAFT LEFT FEMORALTO RIGHT FEMORAL ARTERY;  Surgeon: Larina Earthly, MD;  Location: [facility] OR;  Service: Vascular;  Laterality: Bilateral;  . FLEXIBLE SIGMOIDOSCOPY  12/12/2015   Procedure: FLEXIBLE SIGMOIDOSCOPY;  Surgeon: Romie Levee, MD;  Location: [facility] ORS;  Service: General;;  . ILEOSTOMY CLOSURE N/A 12/12/2015   Procedure: OPEN ILEOSTOMY REVERSAL AND PELVIC WASHOUT;  Surgeon: Romie Levee, MD;  Location: [facility] ORS;  Service: General;  Laterality: N/A;  . INTRAOPERATIVE ARTERIOGRAM Right 08/10/2015   Procedure: INTRA OPERATIVE ARTERIOGRAM Right Iliac Artery;  Surgeon: Larina Earthly, MD;  Location: [facility] OR;  Service: Vascular;  Laterality: Right;  . IR GENERIC HISTORICAL  09/12/2015   IR RADIOLOGIST EVAL & MGMT 09/12/2015 Ralene Muskrat, PA-C [facility] INTERV RAD  . IR RADIOLOGIST EVAL & MGMT  06/23/2018  . IR RADIOLOGIST EVAL & MGMT  07/07/2018  . IV antibiotic infusion therapy     . PERIPHERAL VASCULAR CATHETERIZATION N/A 08/09/2015   Procedure: Abdominal Aortogram w/Lower Extremity;  Surgeon: Fransisco Hertz, MD;  Location: [facility] INVASIVE CV LAB;  Service: Cardiovascular;  Laterality: N/A;  . PERIPHERAL VASCULAR CATHETERIZATION Left 08/09/2015   Procedure: Peripheral Vascular Intervention;  Surgeon: Fransisco Hertz, MD;  Location: [facility] INVASIVE CV LAB;  Service: Cardiovascular;  Laterality: Left;  common iliac  . PICC LINE PLACE PERIPHERAL ([facility] HX)    . RADICAL ABDOMINAL HYSTERECTOMY  1994  . THROMBECTOMY FEMORAL ARTERY Bilateral 09/05/2015   Procedure: THROMBECTOMY Left to Right Femoral to  FEMORAL  ARTERY Bypass,.;  Surgeon: Pryor Ochoa, MD;  Location: [facility] OR;  Service: Vascular;  Laterality: Bilateral;  . THROMBECTOMY ILIAC ARTERY Right 08/10/2015   Procedure: THROMBECTOMY Right ILIAC ARTERY;  Surgeon: Larina Earthly, MD;  Location: [facility] OR;  Service: Vascular;  Laterality: Right;  . TUBAL LIGATION    . UPPER GASTROINTESTINAL ENDOSCOPY      Social History:  reports that she has been smoking cigarettes. She started smoking about 47 years ago. She has a 11.9 pack-year smoking history. She has never used smokeless tobacco. She reports that she does not currently use drugs after having used the following drugs: Marijuana. She reports that she does not drink alcohol.   Allergies  Allergen Reactions  . Codeine Other (See Comments) and Hypertension    "hyper"    Family History  Problem Relation Age of Onset  . Hypertension Mother   . Hyperparathyroidism Mother   . Hypertension Father   . CAD Father        CABG  . Peripheral vascular disease Father   . Graves' disease Father   . Breast cancer Maternal Aunt   . Breast cancer Maternal Aunt   . Prostate  cancer Paternal Uncle   . Rectal cancer Cousin   . Colon cancer Neg Hx   . Stomach cancer Neg Hx   . Esophageal cancer Neg Hx   . Deep vein thrombosis Neg Hx   . Uterine cancer Neg Hx   . Endometrial cancer Neg Hx   . Pancreatic cancer Neg Hx     ***  Prior to Admission medications   Medication Sig Start Date End Date Taking? Authorizing Provider  aspirin EC 81 MG tablet Take 81 mg by mouth daily. Swallow whole.    [provider]  carvedilol (COREG) 6.25 MG tablet TAKE 1 TABLET(6.25 MG) BY MOUTH TWICE DAILY 07/31/22   Croitoru, Mihai, MD  ezetimibe (ZETIA) 10 MG tablet Take 1 tablet (10 mg total) by mouth daily. 12/02/22   Croitoru, Mihai, MD  ibuprofen (ADVIL) 200 MG tablet Take 600-800 mg by mouth every 6 (six) hours as needed for moderate pain.    [provider]  phenylephrine (SUDAFED PE) 10 MG TABS tablet  Take 10 mg by mouth every 4 (four) hours as needed (allergies).    [provider]  Probiotic Product (PROBIOTIC PO) Take 1 tablet by mouth every evening.    [provider]  rosuvastatin (CRESTOR) 10 MG tablet Take 1 tablet (10 mg total) by mouth daily. 01/09/23   Croitoru, Mihai, MD  Vitamin D, Ergocalciferol, (DRISDOL) 1.25 MG (50000 UNIT) CAPS capsule Take 50,000 Units by mouth every Saturday. 01/02/21   [provider]    Physical Exam: BP 138/73   Pulse 65   Temp 98.3 F (36.8 C) (Oral)   Resp 15   Ht 5\' 2"  (1.575 m)   Wt 77.6 kg   SpO2 96%   BMI 31.28 kg/m   General: 62 y.o. year-old female well developed well nourished in no acute distress.  Alert and oriented x3. HEENT: NCAT, EOMI Neck: Supple, trachea medial Cardiovascular: Regular rate and rhythm with no rubs or gallops.  No thyromegaly or JVD noted.  No lower extremity edema. 2/4 pulses in all 4 extremities. Respiratory: Bilateral Rales in lower lobes on auscultation with no wheezes.  Abdomen: Soft, nontender nondistended with normal bowel sounds x4 quadrants. Muskuloskeletal: No cyanosis, clubbing or edema noted bilaterally Neuro: CN II-XII intact, strength 5/5 x 4, sensation, reflexes intact Skin: No ulcerative lesions noted or rashes Psychiatry: Judgement and insight appear normal. Mood is appropriate for condition and setting          Labs on Admission:  Basic Metabolic Panel: Recent Labs  Lab 02/16/23 1330  NA 133*  K 3.8  CL 97*  CO2 26  GLUCOSE 124*  BUN 19  CREATININE 1.07*  CALCIUM 8.6*   Liver Function Tests: No results for input(s): "AST", "ALT", "ALKPHOS", "BILITOT", "PROT", "ALBUMIN" in the last 168 hours. No results for input(s): "LIPASE", "AMYLASE" in the last 168 hours. No results for input(s): "AMMONIA" in the last 168 hours. CBC: Recent Labs  Lab 02/16/23 1330  WBC 29.3*  NEUTROABS 20.2*  HGB 15.4*  HCT 49.5*  MCV 84.6  PLT 327   Cardiac Enzymes: No  results for input(s): "CKTOTAL", "CKMB", "CKMBINDEX", "TROPONINI" in the last 168 hours.  BNP (last 3 results) Recent Labs    02/16/23 1330  BNP 967.0*    ProBNP (last 3 results) No results for input(s): "PROBNP" in the last 8760 hours.  CBG: No results for input(s): "GLUCAP" in the last 168 hours.  Radiological Exams on Admission: CT Angio Chest PE W  and/or Wo Contrast  Result Date: 02/16/2023 CLINICAL DATA:  Headache and shortness of breath EXAM: CT ANGIOGRAPHY CHEST WITH CONTRAST TECHNIQUE: Multidetector CT imaging of the chest was performed using the standard protocol during bolus administration of intravenous contrast. Multiplanar CT image reconstructions and MIPs were obtained to evaluate the vascular anatomy. RADIATION DOSE REDUCTION: This exam was performed according to the departmental dose-optimization program which includes automated exposure control, adjustment of the mA and/or kV according to patient size and/or use of iterative reconstruction technique. CONTRAST:  75mL OMNIPAQUE IOHEXOL 350 MG/ML SOLN COMPARISON:  None Available. FINDINGS: Cardiovascular: No evidence of pulmonary embolus. Normal heart size. No No pericardial effusion. Normal caliber thoracic aorta with moderate atherosclerotic disease. Mild coronary artery calcifications. Mediastinum/Nodes: Esophagus and thyroid are unremarkable. Mildly enlarged mediastinal and bilateral hilar lymph nodes, likely reactive. Reference subcarinal lymph node measuring 11 mm on series 5, image 41. Lungs/Pleura: Central airways are patent. Bronchial wall and septal thickening. Small bilateral pleural effusions. Solid nodules of the right middle lobe measuring 14 x 8 mm on series 7 image 80 and 6 mm on image 70. Upper Abdomen: No acute abnormality. Musculoskeletal: Partially visualized vascular stent coursing through the soft tissues of the right chest wall. No acute or significant osseous findings. Review of the MIP images confirms the  above findings. IMPRESSION: 1. No evidence of pulmonary embolus. 2. Small bilateral pleural effusions with bronchial wall and septal thickening, likely due to pulmonary edema. 3. Solid nodules of the right middle lobe, largest measuring up to 14 mm. Recommend follow-up chest CT in 3 months. This recommendation follows the consensus statement: Guidelines for Management of Incidental Pulmonary Nodules Detected on CT Images: From the Fleischner Society 2017; Radiology 2017; 284:228-243. 4. Mildly enlarged mediastinal and bilateral hilar lymph nodes, likely reactive. 5. Aortic Atherosclerosis (ICD10-I70.0). Electronically Signed   By: Allegra Lai M.D.   On: 02/16/2023 18:47   CT Head Wo Contrast  Result Date: 02/16/2023 CLINICAL DATA:  Headache, increasing frequency or severity EXAM: CT HEAD WITHOUT CONTRAST TECHNIQUE: Contiguous axial images were obtained from the base of the skull through the vertex without intravenous contrast. RADIATION DOSE REDUCTION: This exam was performed according to the departmental dose-optimization program which includes automated exposure control, adjustment of the mA and/or kV according to patient size and/or use of iterative reconstruction technique. COMPARISON:  CT Head 02/01/23 FINDINGS: Brain: Negative for an acute infarct. No hemorrhage. No hydrocephalus. No extra-axial fluid collection. There is sequela of moderate chronic microvascular ischemic change. Subcortical hypodensity in the right frontal lobe either represents a chronic infarct or changes related to chronic microvascular ischemic change. 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 No mass effect. No mass lesion. Vascular: No hyperdense vessel or unexpected calcification. Skull: Normal. Negative for fracture or focal lesion. Sinuses/Orbits: No middle ear or mastoid effusion. Paranasal sinuses are clear. Orbits are unremarkable. Other: None. IMPRESSION: No acute intracranial process.  Electronically Signed   By: Lorenza Cambridge M.D.   On: 02/16/2023 18:20   DG Chest 2 View  Result Date: 02/16/2023 CLINICAL DATA:  Shortness of breath for 3 weeks EXAM: CHEST - 2 VIEW COMPARISON:  12/12/2015 FINDINGS: Bilateral mild interstitial thickening. No focal consolidation. No pleural effusion or pneumothorax. Heart and mediastinal contours are unremarkable. No acute osseous abnormality. IMPRESSION: 1. Bilateral mild interstitial thickening which may reflect mild interstitial edema versus interstitial infection. Electronically Signed   By: Elige Ko M.D.   On: 02/16/2023 15:10    EKG:  I independently viewed the EKG done and my findings are as followed: Sinus tachycardia at a rate of 100 bpm  Assessment/Plan Present on Admission: . Hypertensive emergency  Principal Problem:   Hypertensive emergency  Hypertensive emergency Essential hypertension Continue IV nitroglycerin drip Consider transitioning patient to oral meds when BP becomes better controlled  Acute pulmonary edema  Elevated BNP rule out acute onset CHF Headache Incidental finding of lung nodules   DVT prophylaxis: ***   Code Status: ***   Family Communication: ***   Disposition Plan: ***   Consults called: ***   Admission status: ***     Frankey Shown MD [facility] Pager [phone]  If 7PM-7AM, please contact night-coverage [email or web address] Password [facility]  02/16/2023, 11:53 PM          Review of Systems: {ROS_Text:26778} Past Medical History:  Diagnosis Date  . Acute renal failure (ARF) (HCC) 08/07/2015  . Allergy   . Anemia   . Anxiety   . Asthma    pt brought her proair inhaler 06-06-14  . Cancer (HCC)    cervical  . Colon polyp    hyperplastic  . Colon stricture (HCC)   . Colonic obstruction (HCC)   . Depression   . Family history of adverse reaction to anesthesia    pts mother experiences nausea and vomiting   . GERD (gastroesophageal reflux disease)   . History of  bronchitis   . History of cervical cancer    Stage IB adenocarcinoma of the cervix, treated with rad hyst, 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  . History of chemotherapy   . History of hiatal hernia   . History of radiation therapy   . Hyperlipidemia   . Hypertension   . 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 Migraine    "q couple years maybe" (08/07/2015)  . Shortness of breath dyspnea    seasonal    Past Surgical History:  Procedure Laterality Date  . ABDOMINAL AORTOGRAM W/LOWER EXTREMITY Bilateral 07/26/2021   Procedure: ABDOMINAL AORTOGRAM W/LOWER EXTREMITY;  Surgeon: Leonie Douglas, MD;  Location: [facility] INVASIVE CV LAB;  Service: Cardiovascular;  Laterality: Bilateral;  . AXILLARY-FEMORAL BYPASS GRAFT Right 08/26/2021   Procedure: RIGHT AXILLO-BIFEMORAL BYPASS WITH GORETEC GRAFT;  Surgeon: Leonie Douglas, MD;  Location: [facility] OR;  Service: Vascular;  Laterality: Right;  right axilla to right groin, right groin to left groin  . COLON RESECTION N/A 06/27/2015   Procedure: LYSIS OF ADHESIONS LOW ANTERIOR RESECTION DIVERTING LOOP ILEOSTOMY;  Surgeon: Romie Levee, MD;  Location: [facility] ORS;  Service: General;  Laterality: N/A;  . COLON SURGERY    . COLONOSCOPY    . CYSTOSCOPY WITH STENT PLACEMENT Bilateral 06/27/2015   Procedure: CYSTOSCOPY WITH CATHETER  PLACEMENT;  Surgeon: Marcine Matar, MD;  Location: [facility] ORS;  Service: Urology;  Laterality: Bilateral;  . DIVERTING ILEOSTOMY N/A 06/27/2015   Procedure: DIVERTING LOOP ILEOSTOMY;  Surgeon: Romie Levee, MD;  Location: [facility]S;  Service: General;  Laterality: N/A;  . FEMORAL-FEMORAL BYPASS GRAFT Bilateral 08/10/2015   Procedure: BYPASS GRAFT LEFT FEMORALTO RIGHT FEMORAL ARTERY;  Surgeon: Larina Earthly, MD;  Location: [facility] OR;  Service: Vascular;  Laterality: Bilateral;  . FLEXIBLE SIGMOIDOSCOPY  12/12/2015   Procedure: FLEXIBLE SIGMOIDOSCOPY;  Surgeon: Romie Levee, MD;  Location: [facility] ORS;  Service: General;;  . ILEOSTOMY CLOSURE N/A  12/12/2015   Procedure: OPEN ILEOSTOMY REVERSAL AND PELVIC WASHOUT;  Surgeon: Romie Levee, MD;  Location: [facility]S;  Service: General;  Laterality: N/A;  .  INTRAOPERATIVE ARTERIOGRAM Right 08/10/2015   Procedure: INTRA OPERATIVE ARTERIOGRAM Right Iliac Artery;  Surgeon: Larina Earthly, MD;  Location: [facility] OR;  Service: Vascular;  Laterality: Right;  . IR GENERIC HISTORICAL  09/12/2015   IR RADIOLOGIST EVAL & MGMT 09/12/2015 Ralene Muskrat, PA-C [facility] INTERV RAD  . IR RADIOLOGIST EVAL & MGMT  06/23/2018  . IR RADIOLOGIST EVAL & MGMT  07/07/2018  . IV antibiotic infusion therapy     . PERIPHERAL VASCULAR CATHETERIZATION N/A 08/09/2015   Procedure: Abdominal Aortogram w/Lower Extremity;  Surgeon: Fransisco Hertz, MD;  Location: [facility] INVASIVE CV LAB;  Service: Cardiovascular;  Laterality: N/A;  . PERIPHERAL VASCULAR CATHETERIZATION Left 08/09/2015   Procedure: Peripheral Vascular Intervention;  Surgeon: Fransisco Hertz, MD;  Location: [facility] INVASIVE CV LAB;  Service: Cardiovascular;  Laterality: Left;  common iliac  . PICC LINE PLACE PERIPHERAL ([facility] HX)    . RADICAL ABDOMINAL HYSTERECTOMY  1994  . THROMBECTOMY FEMORAL ARTERY Bilateral 09/05/2015   Procedure: THROMBECTOMY Left to Right Femoral to  FEMORAL ARTERY Bypass,.;  Surgeon: Pryor Ochoa, MD;  Location: [facility] OR;  Service: Vascular;  Laterality: Bilateral;  . THROMBECTOMY ILIAC ARTERY Right 08/10/2015   Procedure: THROMBECTOMY Right ILIAC ARTERY;  Surgeon: Larina Earthly, MD;  Location: [facility] OR;  Service: Vascular;  Laterality: Right;  . TUBAL LIGATION    . UPPER GASTROINTESTINAL ENDOSCOPY     Social History:  reports that she has been smoking cigarettes. She started smoking about 47 years ago. She has a 11.9 pack-year smoking history. She has never used smokeless tobacco. She reports that she does not currently use drugs after having used the following drugs: Marijuana. She reports that she does not drink alcohol.  Allergies  Allergen Reactions  . Codeine  Other (See Comments) and Hypertension    "hyper"    Family History  Problem Relation Age of Onset  . Hypertension Mother   . Hyperparathyroidism Mother   . Hypertension Father   . CAD Father        CABG  . Peripheral vascular disease Father   . Graves' disease Father   . Breast cancer Maternal Aunt   . Breast cancer Maternal Aunt   . Prostate cancer Paternal Uncle   . Rectal cancer Cousin   . Colon cancer Neg Hx   . Stomach cancer Neg Hx   . Esophageal cancer Neg Hx   . Deep vein thrombosis Neg Hx   . Uterine cancer Neg Hx   . Endometrial cancer Neg Hx   . Pancreatic cancer Neg Hx     Prior to Admission medications   Medication Sig Start Date End Date Taking? Authorizing Provider  aspirin EC 81 MG tablet Take 81 mg by mouth daily. Swallow whole.    [provider]  carvedilol (COREG) 6.25 MG tablet TAKE 1 TABLET(6.25 MG) BY MOUTH TWICE DAILY 07/31/22   Croitoru, Mihai, MD  ezetimibe (ZETIA) 10 MG tablet Take 1 tablet (10 mg total) by mouth daily. 12/02/22   Croitoru, Mihai, MD  ibuprofen (ADVIL) 200 MG tablet Take 600-800 mg by mouth every 6 (six) hours as needed for moderate pain.    [provider]  phenylephrine (SUDAFED PE) 10 MG TABS tablet Take 10 mg by mouth every 4 (four) hours as needed (allergies).    [provider]  Probiotic Product (PROBIOTIC PO) Take 1 tablet by mouth every evening.    [provider]  rosuvastatin (CRESTOR) 10 MG tablet Take 1 tablet (10  mg total) by mouth daily. 01/09/23   Croitoru, Mihai, MD  Vitamin D, Ergocalciferol, (DRISDOL) 1.25 MG (50000 UNIT) CAPS capsule Take 50,000 Units by mouth every Saturday. 01/02/21   [provider]    Physical Exam: Vitals:   02/16/23 1900 02/16/23 1920 02/16/23 2031 02/16/23 2200  BP: (!) 177/93 (!) 173/92 (!) 145/96 138/73  Pulse: 69 70 68 65  Resp: 19 18 19 15   Temp:      TempSrc:      SpO2: 93% 94% 93% 96%  Weight:      Height:       *** Data  Reviewed: {Tip this will not be part of the note when signed- Document your independent interpretation of telemetry tracing, EKG, lab, Radiology test or any other diagnostic tests. Add any new diagnostic test ordered today. (Optional):26781} {Results:26384}  Assessment and Plan: No notes have been filed under this hospital service. Service: Hospitalist     Advance Care Planning:   Code Status: Prior ***  Consults: ***  Family Communication: ***  Severity of Illness: {Observation/Inpatient:21159}  Author: Frankey Shown, DO 02/16/2023 11:44 PM  For on call review [email or web address].

## 2023-02-16 NOTE — H&P (Signed)
History and Physical    Patient: Cathy Jones GLO:[id number] DOB: 1960-07-21 DOA: 02/16/2023 DOS: the patient was seen and examined on 02/17/2023 PCP: Benita Stabile, MD  Patient coming from: Home  Chief Complaint:  Chief Complaint  Patient presents with   Shortness of Breath   HPI: Cathy Jones is a 62 y.o. female with medical history significant of hypertension, hyperlipidemia who presents to the emergency department due to 2 to 3-week onset of shortness of breath associated with cough, patient was recently diagnosed with bronchitis and was treated with steroids, symptoms momentarily improved, then worsened within the last few days.  This morning, she had increased shortness of breath and elevated blood pressure which was associated with headache treated and patient was taken to the ED for further evaluation and management.  ED Course:  In the emergency department, she was tachypneic, tachycardic, BP was 221/109, temperature was 90 7.31F and O2 sat was 90% on room air.  Workup in the ED showed WBC 29.3, hemoglobin 15.4, hematocrit 49.5, MCV 84.6, platelets 327.  BMP was normal except for sodium of 133, chloride 97, blood glucose 124 and creatinine of 1.07.  Troponin 221 > 354 > 225 > 187.  BNP 967 Chest x-ray showed  bilateral mild interstitial thickening which may reflect mild interstitial edema versus interstitial infection. CT head without contrast showed no acute intracranial process CT angiography chest with contrast showed no evidence of pulmonary embolus, but showed small bilateral pleural effusion with bronchial wall and septal thickening, likely due to pulmonary edema. As respiratory for milligram x 1 was given, pain medication was provided, hydralazine and labetalol were given, but BP continues to stay elevated and patient was started on IV nitroglycerin. Cardiologist on-call (Dr. Diona Browner) was consulted and recommended continue treatment for hypertensive emergency and to admit  patient to [facility] in the event that she may need a heart cath. Hospitalist was asked admit patient for further evaluation and management.  Review of Systems: Review of systems as noted in the HPI. All other systems reviewed and are negative.   Past Medical History:  Diagnosis Date   Acute renal failure (ARF) (HCC) 08/07/2015   Allergy    Anemia    Anxiety    Asthma    pt brought her proair inhaler 06-06-14   Cancer (HCC)    cervical   Colon polyp    hyperplastic   Colon stricture (HCC)    Colonic obstruction (HCC)    Depression    Family history of adverse reaction to anesthesia    pts mother experiences nausea and vomiting    GERD (gastroesophageal reflux disease)    History of bronchitis    History of cervical cancer    Stage IB adenocarcinoma of the cervix, treated with rad hyst, 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   History of chemotherapy    History of hiatal hernia    History of radiation therapy    Hyperlipidemia    Hypertension    Iliac artery occlusion, 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   Migraine    "q couple years maybe" (08/07/2015)   Shortness of breath dyspnea    seasonal    Past Surgical History:  Procedure Laterality Date   ABDOMINAL AORTOGRAM W/LOWER EXTREMITY Bilateral 07/26/2021   Procedure: ABDOMINAL AORTOGRAM W/LOWER EXTREMITY;  Surgeon: Leonie Douglas, MD;  Location: [facility] INVASIVE CV LAB;  Service: Cardiovascular;  Laterality: Bilateral;   AXILLARY-FEMORAL BYPASS GRAFT Right 08/26/2021   Procedure: RIGHT AXILLO-BIFEMORAL BYPASS WITH GORETEC GRAFT;  Surgeon: Leonie Douglas, MD;  Location: [facility] OR;  Service: Vascular;  Laterality: Right;  right axilla to right groin, right groin to left groin   COLON RESECTION N/A 06/27/2015   Procedure: LYSIS OF ADHESIONS LOW ANTERIOR RESECTION DIVERTING LOOP ILEOSTOMY;  Surgeon: Romie Levee, MD;  Location: [facility] ORS;  Service: General;  Laterality: N/A;   COLON SURGERY     COLONOSCOPY     CYSTOSCOPY WITH STENT PLACEMENT Bilateral  06/27/2015   Procedure: CYSTOSCOPY WITH CATHETER  PLACEMENT;  Surgeon: Marcine Matar, MD;  Location: [facility] ORS;  Service: Urology;  Laterality: Bilateral;   DIVERTING ILEOSTOMY N/A 06/27/2015   Procedure: DIVERTING LOOP ILEOSTOMY;  Surgeon: Romie Levee, MD;  Location: [facility] ORS;  Service: General;  Laterality: N/A;   FEMORAL-FEMORAL BYPASS GRAFT Bilateral 08/10/2015   Procedure: BYPASS GRAFT LEFT FEMORALTO RIGHT FEMORAL ARTERY;  Surgeon: Larina Earthly, MD;  Location: [facility] OR;  Service: Vascular;  Laterality: Bilateral;   FLEXIBLE SIGMOIDOSCOPY  12/12/2015   Procedure: FLEXIBLE SIGMOIDOSCOPY;  Surgeon: Romie Levee, MD;  Location: [facility] ORS;  Service: General;;   ILEOSTOMY CLOSURE N/A 12/12/2015   Procedure: OPEN ILEOSTOMY REVERSAL AND PELVIC WASHOUT;  Surgeon: Romie Levee, MD;  Location: [facility] ORS;  Service: General;  Laterality: N/A;   INTRAOPERATIVE ARTERIOGRAM Right 08/10/2015   Procedure: INTRA OPERATIVE ARTERIOGRAM Right Iliac Artery;  Surgeon: Larina Earthly, MD;  Location: [facility] OR;  Service: Vascular;  Laterality: Right;   IR GENERIC HISTORICAL  09/12/2015   IR RADIOLOGIST EVAL & MGMT 09/12/2015 Ralene Muskrat, PA-C [facility] INTERV RAD   IR RADIOLOGIST EVAL & MGMT  06/23/2018   IR RADIOLOGIST EVAL & MGMT  07/07/2018   IV antibiotic infusion therapy      PERIPHERAL VASCULAR CATHETERIZATION N/A 08/09/2015   Procedure: Abdominal Aortogram w/Lower Extremity;  Surgeon: Fransisco Hertz, MD;  Location: [facility] INVASIVE CV LAB;  Service: Cardiovascular;  Laterality: N/A;   PERIPHERAL VASCULAR CATHETERIZATION Left 08/09/2015   Procedure: Peripheral Vascular Intervention;  Surgeon: Fransisco Hertz, MD;  Location: [facility] INVASIVE CV LAB;  Service: Cardiovascular;  Laterality: Left;  common iliac   PICC LINE PLACE PERIPHERAL ([facility] HX)     RADICAL ABDOMINAL HYSTERECTOMY  1994   THROMBECTOMY FEMORAL ARTERY Bilateral 09/05/2015   Procedure: THROMBECTOMY Left to Right Femoral to  FEMORAL ARTERY Bypass,.;  Surgeon: Pryor Ochoa,  MD;  Location: [facility] OR;  Service: Vascular;  Laterality: Bilateral;   THROMBECTOMY ILIAC ARTERY Right 08/10/2015   Procedure: THROMBECTOMY Right ILIAC ARTERY;  Surgeon: Larina Earthly, MD;  Location: [facility] OR;  Service: Vascular;  Laterality: Right;   TUBAL LIGATION     UPPER GASTROINTESTINAL ENDOSCOPY      Social History:  reports that she has been smoking cigarettes. She started smoking about 47 years ago. She has a 11.9 pack-year smoking history. She has never used smokeless tobacco. She reports that she does not currently use drugs after having used the following drugs: Marijuana. She reports that she does not drink alcohol.   Allergies  Allergen Reactions   Codeine Other (See Comments) and Hypertension    "hyper"    Family History  Problem Relation Age of Onset   Hypertension Mother    Hyperparathyroidism Mother    Hypertension Father    CAD Father        CABG   Peripheral vascular disease Father    Graves' disease Father    Breast cancer Maternal Aunt    Breast cancer Maternal Aunt    Prostate  cancer Paternal Uncle    Rectal cancer Cousin    Colon cancer Neg Hx    Stomach cancer Neg Hx    Esophageal cancer Neg Hx    Deep vein thrombosis Neg Hx    Uterine cancer Neg Hx    Endometrial cancer Neg Hx    Pancreatic cancer Neg Hx      Prior to Admission medications   Medication Sig Start Date End Date Taking? Authorizing Provider  aspirin EC 81 MG tablet Take 81 mg by mouth daily. Swallow whole.    [provider]  carvedilol (COREG) 6.25 MG tablet TAKE 1 TABLET(6.25 MG) BY MOUTH TWICE DAILY 07/31/22   Croitoru, Mihai, MD  ezetimibe (ZETIA) 10 MG tablet Take 1 tablet (10 mg total) by mouth daily. 12/02/22   Croitoru, Mihai, MD  ibuprofen (ADVIL) 200 MG tablet Take 600-800 mg by mouth every 6 (six) hours as needed for moderate pain.    [provider]  phenylephrine (SUDAFED PE) 10 MG TABS tablet Take 10 mg by mouth every 4 (four) hours as needed (allergies).     [provider]  Probiotic Product (PROBIOTIC PO) Take 1 tablet by mouth every evening.    [provider]  rosuvastatin (CRESTOR) 10 MG tablet Take 1 tablet (10 mg total) by mouth daily. 01/09/23   Croitoru, Mihai, MD  Vitamin D, Ergocalciferol, (DRISDOL) 1.25 MG (50000 UNIT) CAPS capsule Take 50,000 Units by mouth every Saturday. 01/02/21   [provider]    Physical Exam: BP (!) 154/75   Pulse 68   Temp 98.3 F (36.8 C) (Oral)   Resp (!) 21   Ht 5\' 2"  (1.575 m)   Wt 77.6 kg   SpO2 93%   BMI 31.28 kg/m   General: 62 y.o. year-old female well developed well nourished in no acute distress.  Alert and oriented x3. HEENT: NCAT, EOMI Neck: Supple, trachea medial Cardiovascular: Regular rate and rhythm with no rubs or gallops.  No thyromegaly or JVD noted.  No lower extremity edema. 2/4 pulses in all 4 extremities. Respiratory: Bilateral Rales in lower lobes on auscultation with no wheezes.  Abdomen: Soft, nontender nondistended with normal bowel sounds x4 quadrants. Muskuloskeletal: No cyanosis, clubbing or edema noted bilaterally Neuro: CN II-XII intact, strength 5/5 x 4, sensation, reflexes intact Skin: No ulcerative lesions noted or rashes Psychiatry: Judgement and insight appear normal. Mood is appropriate for condition and setting          Labs on Admission:  Basic Metabolic Panel: Recent Labs  Lab 02/16/23 1330  NA 133*  K 3.8  CL 97*  CO2 26  GLUCOSE 124*  BUN 19  CREATININE 1.07*  CALCIUM 8.6*   Liver Function Tests: No results for input(s): "AST", "ALT", "ALKPHOS", "BILITOT", "PROT", "ALBUMIN" in the last 168 hours. No results for input(s): "LIPASE", "AMYLASE" in the last 168 hours. No results for input(s): "AMMONIA" in the last 168 hours. CBC: Recent Labs  Lab 02/16/23 1330  WBC 29.3*  NEUTROABS 20.2*  HGB 15.4*  HCT 49.5*  MCV 84.6  PLT 327   Cardiac Enzymes: No results for input(s): "CKTOTAL", "CKMB", "CKMBINDEX",  "TROPONINI" in the last 168 hours.  BNP (last 3 results) Recent Labs    02/16/23 1330  BNP 967.0*    ProBNP (last 3 results) No results for input(s): "PROBNP" in the last 8760 hours.  CBG: No results for input(s): "GLUCAP" in the last 168 hours.  Radiological Exams on Admission: CT Angio Chest PE  W and/or Wo Contrast  Result Date: 02/16/2023 CLINICAL DATA:  Headache and shortness of breath EXAM: CT ANGIOGRAPHY CHEST WITH CONTRAST TECHNIQUE: Multidetector CT imaging of the chest was performed using the standard protocol during bolus administration of intravenous contrast. Multiplanar CT image reconstructions and MIPs were obtained to evaluate the vascular anatomy. RADIATION DOSE REDUCTION: This exam was performed according to the departmental dose-optimization program which includes automated exposure control, adjustment of the mA and/or kV according to patient size and/or use of iterative reconstruction technique. CONTRAST:  75mL OMNIPAQUE IOHEXOL 350 MG/ML SOLN COMPARISON:  None Available. FINDINGS: Cardiovascular: No evidence of pulmonary embolus. Normal heart size. No No pericardial effusion. Normal caliber thoracic aorta with moderate atherosclerotic disease. Mild coronary artery calcifications. Mediastinum/Nodes: Esophagus and thyroid are unremarkable. Mildly enlarged mediastinal and bilateral hilar lymph nodes, likely reactive. Reference subcarinal lymph node measuring 11 mm on series 5, image 41. Lungs/Pleura: Central airways are patent. Bronchial wall and septal thickening. Small bilateral pleural effusions. Solid nodules of the right middle lobe measuring 14 x 8 mm on series 7 image 80 and 6 mm on image 70. Upper Abdomen: No acute abnormality. Musculoskeletal: Partially visualized vascular stent coursing through the soft tissues of the right chest wall. No acute or significant osseous findings. Review of the MIP images confirms the above findings. IMPRESSION: 1. No evidence of pulmonary  embolus. 2. Small bilateral pleural effusions with bronchial wall and septal thickening, likely due to pulmonary edema. 3. Solid nodules of the right middle lobe, largest measuring up to 14 mm. Recommend follow-up chest CT in 3 months. This recommendation follows the consensus statement: Guidelines for Management of Incidental Pulmonary Nodules Detected on CT Images: From the Fleischner Society 2017; Radiology 2017; 284:228-243. 4. Mildly enlarged mediastinal and bilateral hilar lymph nodes, likely reactive. 5. Aortic Atherosclerosis (ICD10-I70.0). Electronically Signed   By: Allegra Lai M.D.   On: 02/16/2023 18:47   CT Head Wo Contrast  Result Date: 02/16/2023 CLINICAL DATA:  Headache, increasing frequency or severity EXAM: CT HEAD WITHOUT CONTRAST TECHNIQUE: Contiguous axial images were obtained from the base of the skull through the vertex without intravenous contrast. RADIATION DOSE REDUCTION: This exam was performed according to the departmental dose-optimization program which includes automated exposure control, adjustment of the mA and/or kV according to patient size and/or use of iterative reconstruction technique. COMPARISON:  CT Head 02/01/23 FINDINGS: Brain: Negative for an acute infarct. No hemorrhage. No hydrocephalus. No extra-axial fluid collection. There is sequela of moderate chronic microvascular ischemic change. Subcortical hypodensity in the right frontal lobe either represents a chronic infarct or changes related to chronic microvascular ischemic change. 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 No mass effect. No mass lesion. Vascular: No hyperdense vessel or unexpected calcification. Skull: Normal. Negative for fracture or focal lesion. Sinuses/Orbits: No middle ear or mastoid effusion. Paranasal sinuses are clear. Orbits are unremarkable. Other: None. IMPRESSION: No acute intracranial process. Electronically Signed   By: Lorenza Cambridge M.D.   On:  02/16/2023 18:20   DG Chest 2 View  Result Date: 02/16/2023 CLINICAL DATA:  Shortness of breath for 3 weeks EXAM: CHEST - 2 VIEW COMPARISON:  12/12/2015 FINDINGS: Bilateral mild interstitial thickening. No focal consolidation. No pleural effusion or pneumothorax. Heart and mediastinal contours are unremarkable. No acute osseous abnormality. IMPRESSION: 1. Bilateral mild interstitial thickening which may reflect mild interstitial edema versus interstitial infection. Electronically Signed   By: Elige Ko M.D.   On: 02/16/2023 15:10  EKG: I independently viewed the EKG done and my findings are as followed: Sinus tachycardia at a rate of 100 bpm  Assessment/Plan Present on Admission:  Hypertensive emergency  Essential hypertension  Mixed hyperlipidemia  Principal Problem:   Hypertensive emergency Active Problems:   Essential hypertension   Mixed hyperlipidemia   Acute pulmonary edema (HCC)   Elevated brain natriuretic peptide (BNP) level   Elevated troponin   Headache   Lung nodules  Hypertensive emergency Essential hypertension Continue IV nitroglycerin drip Consider transitioning patient to oral meds when BP becomes better controlled  Acute pulmonary edema CT angiography chest with contrast showed small bilateral pleural effusions suggested to be pulmonary edema Continue IV Lasix 40 twice daily  Elevated BNP rule out acute onset CHF BNP 967 Continue total input/output, daily weights and fluid restriction Continue heart healthy diet  Echocardiogram in the morning   Elevated troponin possibly secondary to type II demand ischemia Troponin is flattened-  221 > 354 > 225 > 187.    Headache This may be due to patient's elevated BP and/or nitroglycerin Continue Tylenol as needed  Incidental finding of lung nodules CT of your chest showed solid nodules of the right middle lobe, largest measuring up to 14 mm. Chest CT in 3 months was recommended per radiologist  Mixed  hyperlipidemia Continue Crestor, Zetia   DVT prophylaxis: Lovenox   Advance Care Planning: CODE STATUS: Full code  Consults: Cardiology (Dr. Diona Browner) by [facility] EDP  Family Communication: Daughter and husband at bedside (all questions answered to satisfaction)  Severity of Illness: The appropriate patient status for this patient is INPATIENT. Inpatient status is judged to be reasonable and necessary in order to provide the required intensity of service to ensure the patient's safety. The patient's presenting symptoms, physical exam findings, and initial radiographic and laboratory data in the context of their chronic comorbidities is felt to place them at high risk for further clinical deterioration. Furthermore, it is not anticipated that the patient will be medically stable for discharge from the hospital within 2 midnights of admission.   * I certify that at the point of admission it is my clinical judgment that the patient will require inpatient hospital care spanning beyond 2 midnights from the point of admission due to high intensity of service, high risk for further deterioration and high frequency of surveillance required.*  Author: Frankey Shown, DO 02/17/2023 12:20 AM  For on call review [email or web address].

## 2023-02-16 NOTE — ED Notes (Addendum)
Date and time results received: 02/16/23 2054   Test: Troponin Critical Value: 187  Name of Provider Notified: Adefeso  Orders Received? Or Actions Taken?: NA

## 2023-02-16 NOTE — ED Provider Notes (Signed)
Care of patient assumed from Dr. Jearld Fenton.  Patient presenting for ongoing shortness of breath and has a relief in addition intermittent chest pain over the past several days.  Blood pressure elevated in the range of 230s over 100s on arrival.  Initial troponin elevated at 221.  Getting treated for hypertensive emergency.  Will need admission. Physical Exam  BP (!) 237/90   Pulse 100   Temp 98.1 F (36.7 C) (Oral)   Resp (!) 22   Ht 5\' 2"  (1.575 m)   Wt 77.6 kg   SpO2 94%   BMI 31.28 kg/m   Physical Exam Vitals and nursing note reviewed.  Constitutional:      General: She is not in acute distress.    Appearance: She is well-developed. She is not ill-appearing, toxic-appearing or diaphoretic.  HENT:     Head: Normocephalic and atraumatic.     Mouth/Throat:     Mouth: Mucous membranes are moist.  Eyes:     Conjunctiva/sclera: Conjunctivae normal.  Cardiovascular:     Rate and Rhythm: Normal rate and regular rhythm.     Heart sounds: No murmur heard. Pulmonary:     Effort: Pulmonary effort is normal. No respiratory distress.     Breath sounds: Normal breath sounds. No wheezing, rhonchi or rales.  Chest:     Chest wall: No tenderness.  Abdominal:     Palpations: Abdomen is soft.     Tenderness: There is no abdominal tenderness.  Musculoskeletal:        General: No swelling. Normal range of motion.     Cervical back: Neck supple.  Skin:    General: Skin is warm and dry.     Coloration: Skin is not cyanotic or pale.  Neurological:     General: No focal deficit present.     Mental Status: She is alert and oriented to person, place, and time.  Psychiatric:        Mood and Affect: Mood normal.        Behavior: Behavior normal.     Procedures  Procedures  ED Course / MDM   Clinical Course as of 02/16/23 1544  Mon Feb 16, 2023  1459 WBC(!): 29.3 Has been on steroids for cough/bronchitis [HN]  1539 Push doses IV hydralazine and labetalol seem to be ineffective. Will start  nitroglycerin gtt.  [HN]  1539 Patient is signed out to the oncoming ED physician Dr. Durwin Nora who is made aware of her history, presentation, exam, workup, and plan.  Plan is to control BP on nitroglycerin gtt and admit to hospital for [facility] emergency. [HN]    Clinical Course User Index [HN] Loetta Rough, MD   Medical Decision Making Amount and/or Complexity of Data Reviewed Labs: ordered. Decision-making details documented in ED Course. Radiology: ordered.  Risk OTC drugs. Prescription drug management.   On assessment, patient resting on ED stretcher.  She has a wet rag over her eyes for her current severe headache.  She endorses current nausea.  Blood pressure remains needed.  Given her headache, will check CT of head.  Headache cocktail was ordered.  Initial lab work notable for elevated troponin.  Case discussed with cardiologist on-call, Dr. Diona Browner, who recommends continued treatment for hypertensive emergency and admission to [facility] in the event that she may need a heart cath.  He does recommend medicine admission.  He advises holding off on heparin until blood pressure improves.  NTG gtt. was ordered in addition to labetalol.  CT of  the head showed no acute findings.  On CTA of chest, patient does have bilateral pleural effusions.  No PE or focal opacities were identified.  Headache ultimately improved and blood pressure did as well.  Although second troponin increased, third troponin stabilized.  Patient was admitted for further management.  CRITICAL CARE Performed by: Gloris Manchester   Total critical care time: 35 minutes  Critical care time was exclusive of separately billable procedures and treating other patients.  Critical care was necessary to treat or prevent imminent or life-threatening deterioration.  Critical care was time spent personally by me on the following activities: development of treatment plan with patient and/or surrogate as well as nursing,  discussions with consultants, evaluation of patient's response to treatment, examination of patient, obtaining history from patient or surrogate, ordering and performing treatments and interventions, ordering and review of laboratory studies, ordering and review of radiographic studies, pulse oximetry and re-evaluation of patient's condition.        Gloris Manchester, MD 02/16/23 [phone]

## 2023-02-16 NOTE — ED Notes (Signed)
EDP into room. RN at [facility] establishing IV.

## 2023-02-16 NOTE — ED Provider Notes (Signed)
[facility]    CSN: [id number] Arrival date & time: 02/16/23  1224      History   Chief Complaint No chief complaint on file.   HPI Cathy Jones is a 62 y.o. female.   Patient presents today with 2-week history of cough, chest pain and shortness of breath that acutely worsened this morning.  She denies active wheezing, but reports is very difficult to sleep because she cannot breathe when she lays down.  No abdominal pain, nausea/vomiting, diarrhea.  Reports appetite and energy levels have been decreased.  Reports she was seen in the ER and treated for migraine and symptoms never improved.  She has used rescue inhaler without much benefit.  Reports 2 weeks ago at the ER, she was treated with prednisone and oral antibiotics.  She did not fully improve, so she contacted her primary care provider who gave her an increased dose of prednisone and "stronger" antibiotics.  She reports that she has 1 more day of these medicines and is feeling worse.     Past Medical History:  Diagnosis Date   Acute renal failure (ARF) (HCC) 08/07/2015   Allergy    Anemia    Anxiety    Asthma    pt brought her proair inhaler 06-06-14   Cancer (HCC)    cervical   Colon polyp    hyperplastic   Colon stricture (HCC)    Colonic obstruction (HCC)    Depression    Family history of adverse reaction to anesthesia    pts mother experiences nausea and vomiting    GERD (gastroesophageal reflux disease)    History of bronchitis    History of cervical cancer    Stage IB adenocarcinoma of the cervix, treated with rad hyst, 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   History of chemotherapy    History of hiatal hernia    History of radiation therapy    Hyperlipidemia    Hypertension    Iliac artery occlusion, 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   Migraine    "q couple years maybe" (08/07/2015)   Shortness of breath dyspnea    seasonal     Patient Active Problem List   Diagnosis Date Noted   Pelvic mass in female  01/31/2021   Malignant neoplasm of cervix (HCC) 01/31/2021   Generalized abdominal pain    Pelvic fluid collection    Pelvic abscess in female 06/07/2018   Tobacco abuse 06/07/2018   PAD (peripheral artery disease) (HCC) 08/10/2015   B12 deficiency anemia 08/02/2015   Folic acid deficiency anemia 08/02/2015   Hyperlipidemia 07/30/2015   Depression 07/30/2015   Ileostomy in place [facility]) 07/30/2015   Rectal stricture s/p LAR resection [id number] 06/27/2015    Past Surgical History:  Procedure Laterality Date   ABDOMINAL AORTOGRAM W/LOWER EXTREMITY Bilateral 07/26/2021   Procedure: ABDOMINAL AORTOGRAM W/LOWER EXTREMITY;  Surgeon: Leonie Douglas, MD;  Location: [facility] INVASIVE CV LAB;  Service: Cardiovascular;  Laterality: Bilateral;   AXILLARY-FEMORAL BYPASS GRAFT Right 08/26/2021   Procedure: RIGHT AXILLO-BIFEMORAL BYPASS WITH GORETEC GRAFT;  Surgeon: Leonie Douglas, MD;  Location: [facility] OR;  Service: Vascular;  Laterality: Right;  right axilla to right groin, right groin to left groin   COLON RESECTION N/A 06/27/2015   Procedure: LYSIS OF ADHESIONS LOW ANTERIOR RESECTION DIVERTING LOOP ILEOSTOMY;  Surgeon: Romie Levee, MD;  Location: [facility] ORS;  Service: General;  Laterality: N/A;   COLON SURGERY     COLONOSCOPY     CYSTOSCOPY WITH STENT PLACEMENT  Bilateral 06/27/2015   Procedure: CYSTOSCOPY WITH CATHETER  PLACEMENT;  Surgeon: Marcine Matar, MD;  Location: [facility];  Service: Urology;  Laterality: Bilateral;   DIVERTING ILEOSTOMY N/A 06/27/2015   Procedure: DIVERTING LOOP ILEOSTOMY;  Surgeon: Romie Levee, MD;  Location: [facility];  Service: General;  Laterality: N/A;   FEMORAL-FEMORAL BYPASS GRAFT Bilateral 08/10/2015   Procedure: BYPASS GRAFT LEFT FEMORALTO RIGHT FEMORAL ARTERY;  Surgeon: Larina Earthly, MD;  Location: [facility];  Service: Vascular;  Laterality: Bilateral;   FLEXIBLE SIGMOIDOSCOPY  12/12/2015   Procedure: FLEXIBLE SIGMOIDOSCOPY;  Surgeon: Romie Levee, MD;  Location: [facility];   Service: General;;   ILEOSTOMY CLOSURE N/A 12/12/2015   Procedure: OPEN ILEOSTOMY REVERSAL AND PELVIC WASHOUT;  Surgeon: Romie Levee, MD;  Location: [facility];  Service: General;  Laterality: N/A;   INTRAOPERATIVE ARTERIOGRAM Right 08/10/2015   Procedure: INTRA OPERATIVE ARTERIOGRAM Right Iliac Artery;  Surgeon: Larina Earthly, MD;  Location: [facility] OR;  Service: Vascular;  Laterality: Right;   IR GENERIC HISTORICAL  09/12/2015   IR RADIOLOGIST EVAL & MGMT 09/12/2015 Ralene Muskrat, PA-C [facility] INTERV RAD   IR RADIOLOGIST EVAL & MGMT  06/23/2018   IR RADIOLOGIST EVAL & MGMT  07/07/2018   IV antibiotic infusion therapy      PERIPHERAL VASCULAR CATHETERIZATION N/A 08/09/2015   Procedure: Abdominal Aortogram w/Lower Extremity;  Surgeon: Fransisco Hertz, MD;  Location: [facility] INVASIVE CV LAB;  Service: Cardiovascular;  Laterality: N/A;   PERIPHERAL VASCULAR CATHETERIZATION Left 08/09/2015   Procedure: Peripheral Vascular Intervention;  Surgeon: Fransisco Hertz, MD;  Location: [facility]VAS[facility] CV LAB;  Service: Cardiovascular;  Laterality: Left;  common iliac   PICC LINE PLACE PERIPHERAL ([facility] HX)     RADICAL ABDOMINAL HYSTERECTOMY  1994   THROMBECTOMY FEMORAL ARTERY Bilateral 09/05/2015   Procedure: THROMBECTOMY Left to Right Femoral to  FEMORAL ARTERY Bypass,.;  Surgeon: Pryor Ochoa, MD;  Location: [facility] OR;  Service: Vascular;  Laterality: Bilateral;   THROMBECTOMY ILIAC ARTERY Right 08/10/2015   Procedure: THROMBECTOMY Right ILIAC ARTERY;  Surgeon: Larina Earthly, MD;  Location[facility];  Service: Vascular;  Laterality: Right;   TUBAL LIGATION     UPPER GASTROINTESTINAL ENDOSCOPY      OB History     Gravida  1   Para  1   Term      Preterm      AB      Living         SAB      IAB      Ectopic      Multiple      Live Births               Home Medications    Prior to Admission medications   Medication Sig Start Date End Date Taking? Authorizing Provider  aspirin EC 81 MG tablet Take 81 mg  by mouth daily. Swallow whole.    [provider]  carvedilol (COREG) 6.25 MG tablet TAKE 1 TABLET(6.25 MG) BY MOUTH TWICE DAILY 07/31/22   Croitoru, Mihai, MD  ezetimibe (ZETIA) 10 MG tablet Take 1 tablet (10 mg total) by mouth daily. 12/02/22   Croitoru, Mihai, MD  ibuprofen (ADVIL) 200 MG tablet Take 600-800 mg by mouth every 6 (six) hours as needed for moderate pain.    [provider]  phenylephrine (SUDAFED PE) 10 MG TABS tablet Take 10 mg by mouth every 4 (four) hours as needed (allergies).    [provider]  Probiotic Product (PROBIOTIC PO) Take 1 tablet by mouth every evening.    [provider]  rosuvastatin (CRESTOR) 10 MG tablet Take 1 tablet (10 mg total) by mouth daily. 01/09/23   Croitoru, Mihai, MD  Vitamin D, Ergocalciferol, (DRISDOL) 1.25 MG (50000 UNIT) CAPS capsule Take 50,000 Units by mouth every Saturday. 01/02/21   [provider]    Family History Family History  Problem Relation Age of Onset   Hypertension Mother    Hyperparathyroidism Mother    Hypertension Father    CAD Father        CABG   Peripheral vascular disease Father    Graves' disease Father    Breast cancer Maternal Aunt    Breast cancer Maternal Aunt    Prostate cancer Paternal Uncle    Rectal cancer Cousin    Colon cancer Neg Hx    Stomach cancer Neg Hx    Esophageal cancer Neg Hx    Deep vein thrombosis Neg Hx    Uterine cancer Neg Hx    Endometrial cancer Neg Hx    Pancreatic cancer Neg Hx     Social History Social History   Tobacco Use   Smoking status: Every Day    Current packs/day: 0.25    Average packs/day: 0.3 packs/day for 47.8 years (11.9 ttl pk-yrs)    Types: Cigarettes    Start date: 05/17/1975   Smokeless tobacco: Never   Tobacco comments:    Less than 5 cigarettes per day  Vaping Use   Vaping status: Former   Devices: e-cigrette with out nicotene for of the ostomy  Substance Use Topics   Alcohol use: No    Alcohol/week: 0.0  standard drinks of alcohol   Drug use: Not Currently    Types: Marijuana    Comment: smoked marijuana last around 07/2021     Allergies   Codeine   Review of Systems Review of Systems Per HPI  Physical Exam Triage Vital Signs ED Triage Vitals  Encounter Vitals Group     BP      Systolic BP Percentile      Diastolic BP Percentile      Pulse      Resp      Temp      Temp src      SpO2      Weight      Height      Head Circumference      Peak Flow      Pain Score      Pain Loc      Pain Education      Exclude from Growth Chart    No data found.  Updated Vital Signs There were no vitals taken for this visit.  Visual Acuity Right Eye Distance:   Left Eye Distance:   Bilateral Distance:    Right Eye Near:   Left Eye Near:    Bilateral Near:     Physical Exam Vitals and nursing note reviewed.  Constitutional:      General: She is not in acute distress.    Appearance: Normal appearance. She is not ill-appearing or toxic-appearing.  HENT:     Head: Normocephalic and atraumatic.     Right Ear: Tympanic membrane, ear canal and external ear normal.     Left Ear: Tympanic membrane, ear canal and external ear normal.     Nose: No congestion or rhinorrhea.     Mouth/Throat:     Mouth: Mucous membranes are  moist.     Pharynx: Oropharynx is clear. No oropharyngeal exudate or posterior oropharyngeal erythema.  Eyes:     General: No scleral icterus.    Extraocular Movements: Extraocular movements intact.  Cardiovascular:     Rate and Rhythm: Regular rhythm. Tachycardia present.  Pulmonary:     Effort: Tachypnea and respiratory distress present.     Breath sounds: Normal breath sounds. Decreased air movement present.     Comments: Patient has to take frequent pauses to breathe when speaking Skin:    General: Skin is warm and dry.     Coloration: Skin is not jaundiced or pale.     Findings: No erythema or rash.  Neurological:     Mental Status: She is alert and  oriented to person, place, and time.  Psychiatric:        Behavior: Behavior is cooperative.      UC Treatments / Results  Labs (all labs ordered are listed, but only abnormal results are displayed) Labs Reviewed - No data to display  EKG   Radiology No results found.  Procedures Procedures (including critical care time)  Medications Ordered in UC Medications - No data to display  Initial Impression / Assessment and Plan / UC Course  I have reviewed the triage vital signs and the nursing notes.  Pertinent labs & imaging results that were available during my care of the patient were reviewed by me and considered in my medical decision making (see chart for details).   In triage, patient is tachypneic, hypertensive, tachycardic, and SpO2 is 88 to 90% on room air.  She is afebrile.  1. Hypertensive urgency 2. Tachycardia 3. Hypoxia 4. Tachypnea After DuoNeb, SpO2 increased to 97% on room air. However given symptoms, other vital signs, recommended further evaluation and management in the ER Patient is here to transport via private vehicle and female family member reports they will take her immediately  The patient was given the opportunity to ask questions.  All questions answered to their satisfaction.  The patient is in agreement to this plan.    Final Clinical Impressions(s) / UC Diagnoses   Final diagnoses:  None   Discharge Instructions   None    ED Prescriptions   None    PDMP not reviewed this encounter.   Valentino Nose, NP 02/16/23 1259

## 2023-02-16 NOTE — Discharge Instructions (Signed)
Please go directly to the emergency room for further evaluation management of your symptoms

## 2023-02-16 NOTE — ED Triage Notes (Signed)
Pt c/o SOB x 2 weeks pt was on antibiotics for sx's got better then got worse again, this morning she felt like she couldn't take in a breath.

## 2023-02-16 NOTE — ED Notes (Signed)
Assisted pt to bathroom. Call light in place and resting now

## 2023-02-16 NOTE — ED Notes (Signed)
Patient is being discharged from the Urgent Care and sent to the Emergency Department via POV . Per Cathlean Marseilles, NP patient is in need of higher level of care due to SOB. Patient is aware and verbalizes understanding of plan of care.  Vitals:   02/16/23 1242  BP: (!) 221/109  Pulse: (!) 122  Resp: (!) 24  Temp: 97.9 F (36.6 C)  SpO2: 90%

## 2023-02-17 ENCOUNTER — Inpatient Hospital Stay (HOSPITAL_COMMUNITY): Payer: BC Managed Care – PPO

## 2023-02-17 DIAGNOSIS — R918 Other nonspecific abnormal finding of lung field: Secondary | ICD-10-CM | POA: Insufficient documentation

## 2023-02-17 DIAGNOSIS — R7989 Other specified abnormal findings of blood chemistry: Secondary | ICD-10-CM | POA: Insufficient documentation

## 2023-02-17 DIAGNOSIS — I5031 Acute diastolic (congestive) heart failure: Secondary | ICD-10-CM | POA: Diagnosis not present

## 2023-02-17 DIAGNOSIS — J810 Acute pulmonary edema: Secondary | ICD-10-CM | POA: Insufficient documentation

## 2023-02-17 DIAGNOSIS — I161 Hypertensive emergency: Secondary | ICD-10-CM | POA: Diagnosis not present

## 2023-02-17 DIAGNOSIS — I739 Peripheral vascular disease, unspecified: Secondary | ICD-10-CM

## 2023-02-17 DIAGNOSIS — R519 Headache, unspecified: Secondary | ICD-10-CM | POA: Insufficient documentation

## 2023-02-17 DIAGNOSIS — I509 Heart failure, unspecified: Secondary | ICD-10-CM

## 2023-02-17 LAB — COMPREHENSIVE METABOLIC PANEL
ALT: 10 U/L (ref 0–44)
AST: 10 U/L — ABNORMAL LOW (ref 15–41)
Albumin: 3.1 g/dL — ABNORMAL LOW (ref 3.5–5.0)
Alkaline Phosphatase: 85 U/L (ref 38–126)
Anion gap: 9 (ref 5–15)
BUN: 21 mg/dL (ref 8–23)
CO2: 25 mmol/L (ref 22–32)
Calcium: 8.5 mg/dL — ABNORMAL LOW (ref 8.9–10.3)
Chloride: 99 mmol/L (ref 98–111)
Creatinine, Ser: 1.07 mg/dL — ABNORMAL HIGH (ref 0.44–1.00)
GFR, Estimated: 59 mL/min — ABNORMAL LOW (ref >60)
Glucose, Bld: 123 mg/dL — ABNORMAL HIGH (ref 70–99)
Potassium: 4.2 mmol/L (ref 3.5–5.1)
Sodium: 133 mmol/L — ABNORMAL LOW (ref 135–145)
Total Bilirubin: 1.2 mg/dL (ref 0.3–1.2)
Total Protein: 5.7 g/dL — ABNORMAL LOW (ref 6.5–8.1)

## 2023-02-17 LAB — ECHOCARDIOGRAM COMPLETE
AR max vel: 1.73 cm2
AV Area VTI: 2.25 cm2
AV Area mean vel: 2.06 cm2
AV Mean grad: 5 mm[Hg]
AV Peak grad: 11.4 mm[Hg]
Ao pk vel: 1.69 m/s
Area-P 1/2: 4.1 cm2
Height: 62 in
P 1/2 time: 585 ms
S' Lateral: 3.5 cm
Weight: 2736 [oz_av]

## 2023-02-17 LAB — CBC
HCT: 40.2 % (ref 36.0–46.0)
Hemoglobin: 13 g/dL (ref 12.0–15.0)
MCH: 26.7 pg (ref 26.0–34.0)
MCHC: 32.3 g/dL (ref 30.0–36.0)
MCV: 82.7 fL (ref 80.0–100.0)
Platelets: 230 10*3/uL (ref 150–400)
RBC: 4.86 MIL/uL (ref 3.87–5.11)
RDW: 14.2 % (ref 11.5–15.5)
WBC: 20.5 10*3/uL — ABNORMAL HIGH (ref 4.0–10.5)
nRBC: 0 % (ref 0.0–0.2)

## 2023-02-17 LAB — MAGNESIUM: Magnesium: 2.1 mg/dL (ref 1.7–2.4)

## 2023-02-17 LAB — PHOSPHORUS: Phosphorus: 4.4 mg/dL (ref 2.5–4.6)

## 2023-02-17 LAB — MRSA NEXT GEN BY PCR, NASAL: MRSA by PCR Next Gen: NOT DETECTED

## 2023-02-17 LAB — HIV ANTIBODY (ROUTINE TESTING W REFLEX): HIV Screen 4th Generation wRfx: NONREACTIVE

## 2023-02-17 MED ORDER — ACETAMINOPHEN 650 MG RE SUPP: 650.0000 mg | Freq: Four times a day (QID) | RECTAL | Status: DC | PRN

## 2023-02-17 MED ORDER — ONDANSETRON HCL 4 MG/2ML IJ SOLN
4.0000 mg | Freq: Four times a day (QID) | INTRAMUSCULAR | Status: DC | PRN
  Administered 2023-02-17: 4 mg via INTRAVENOUS
  Filled 2023-02-17: qty 2

## 2023-02-17 MED ORDER — PERFLUTREN LIPID MICROSPHERE
1.0000 mL | INTRAVENOUS | Status: AC | PRN
  Administered 2023-02-17: 2 mL via INTRAVENOUS

## 2023-02-17 MED ORDER — ASPIRIN 81 MG PO TBEC
81.0000 mg | DELAYED_RELEASE_TABLET | Freq: Every day | ORAL | Status: DC
  Administered 2023-02-17 – 2023-02-19 (×3): 81 mg via ORAL
  Filled 2023-02-17 (×3): qty 1

## 2023-02-17 MED ORDER — CHLORHEXIDINE GLUCONATE CLOTH 2 % EX PADS
6.0000 | MEDICATED_PAD | Freq: Every day | CUTANEOUS | Status: DC
  Administered 2023-02-17 – 2023-02-19 (×3): 6 via TOPICAL

## 2023-02-17 MED ORDER — ACETAMINOPHEN 325 MG PO TABS
650.0000 mg | ORAL_TABLET | Freq: Four times a day (QID) | ORAL | Status: DC | PRN
  Administered 2023-02-17: 650 mg via ORAL
  Filled 2023-02-17: qty 2

## 2023-02-17 MED ORDER — ENOXAPARIN SODIUM 40 MG/0.4ML IJ SOSY
40.0000 mg | PREFILLED_SYRINGE | INTRAMUSCULAR | Status: DC
  Administered 2023-02-17 – 2023-02-19 (×3): 40 mg via SUBCUTANEOUS
  Filled 2023-02-17 (×3): qty 0.4

## 2023-02-17 MED ORDER — NICARDIPINE HCL IN NACL 20-0.86 MG/200ML-% IV SOLN
3.0000 mg/h | INTRAVENOUS | Status: DC
  Administered 2023-02-17 (×2): 7.5 mg/h via INTRAVENOUS
  Administered 2023-02-17: 3 mg/h via INTRAVENOUS
  Administered 2023-02-17 – 2023-02-18 (×2): 5 mg/h via INTRAVENOUS
  Administered 2023-02-18: 3 mg/h via INTRAVENOUS
  Filled 2023-02-17 (×6): qty 200

## 2023-02-17 MED ORDER — BUTALBITAL-APAP-CAFFEINE 50-325-40 MG PO TABS
1.0000 | ORAL_TABLET | Freq: Four times a day (QID) | ORAL | Status: DC | PRN
  Administered 2023-02-17: 1 via ORAL
  Filled 2023-02-17: qty 1

## 2023-02-17 MED ORDER — ONDANSETRON HCL 4 MG PO TABS: 4.0000 mg | ORAL_TABLET | Freq: Four times a day (QID) | ORAL | Status: DC | PRN

## 2023-02-17 MED ORDER — EZETIMIBE 10 MG PO TABS
10.0000 mg | ORAL_TABLET | Freq: Every day | ORAL | Status: DC
  Administered 2023-02-17 – 2023-02-19 (×3): 10 mg via ORAL
  Filled 2023-02-17 (×3): qty 1

## 2023-02-17 MED ORDER — CARVEDILOL 3.125 MG PO TABS
6.2500 mg | ORAL_TABLET | Freq: Two times a day (BID) | ORAL | Status: DC
  Administered 2023-02-17 (×2): 6.25 mg via ORAL
  Filled 2023-02-17 (×2): qty 2

## 2023-02-17 MED ORDER — HYDRALAZINE HCL 20 MG/ML IJ SOLN
10.0000 mg | Freq: Four times a day (QID) | INTRAMUSCULAR | Status: DC | PRN
  Administered 2023-02-17 – 2023-02-18 (×2): 10 mg via INTRAVENOUS
  Filled 2023-02-17 (×2): qty 1

## 2023-02-17 MED ORDER — FUROSEMIDE 10 MG/ML IJ SOLN
40.0000 mg | Freq: Two times a day (BID) | INTRAMUSCULAR | Status: DC
  Administered 2023-02-17 – 2023-02-18 (×3): 40 mg via INTRAVENOUS
  Filled 2023-02-17 (×3): qty 4

## 2023-02-17 MED ORDER — ROSUVASTATIN CALCIUM 10 MG PO TABS
10.0000 mg | ORAL_TABLET | Freq: Every day | ORAL | Status: DC
  Administered 2023-02-17 – 2023-02-19 (×3): 10 mg via ORAL
  Filled 2023-02-17 (×3): qty 1

## 2023-02-17 NOTE — Consult Note (Signed)
Cardiology Consultation   Patient ID: Cathy Jones MRN: [id number]; DOB: [birth year]  Admit date: 02/16/2023 Date of Consult: 02/17/2023  PCP:  Benita Stabile, MD   [facility] Providers Cardiologist:  Thurmon Fair, MD        Patient Profile:   Cathy Jones is a 62 y.o. female with a hx of PAD (s/p angioplasty and stenting of right iliac in 2017 and ultimately required fem-fem bypass and later required thrombectomy, repeat surgery in 08/2021 with removal of prior fem-fem bypass and required bilateral common femoral thromboendarterectomy and right axillary to bifemoral bypass), CAD (Coronary CTA in 07/2021 showing calcium score of 305 with mild plaque in LAD and RCA but difficult to assess LCx), HTN, HLD, migraine headaches, family history of CAD and tobacco use who is being seen 02/17/2023 for the evaluation of elevated troponin values at the request of Dr. Sherryll Burger.  History of Present Illness:   [name] initially presented to [facility] on 02/16/2023 for evaluation of a 2-week history of coughing and shortness of breath which was acutely worse the morning of evaluation. She had recently been prescribed prednisone and antibiotics by her PCP and symptoms did not improve. Given that she was tachypneic, hypertensive, tachycardic and hypoxic at Urgent Care, she was sent to the ED for further evaluation.  Upon arrival, she was found to be in hypertensive emergency with BP at 228/108. Initial labs showed WBC 29.3, Hgb 15.4, platelets 327, Na+ 133, K+ 3.8 and creatinine 1.07.  BNP elevated at 967.  Initial and repeat Hs Troponin values elevated at 221, 354, 225 and 187. EKG shows NSR, HR 97 with anterior infarct pattern. CXR showed bilateral mild interstitial thickening which may reflect mild interstitial edema or interstitial infection. CT Head showed no acute intracranial abnormalities. CTA showed no evidence of a PE but was noted to have small bilateral pleural effusions,  most consistent with pulmonary edema. Also noted to have solid nodules of the right middle lobe with the largest measuring 14 mm and follow-up Chest CT recommended in 3 months.  For her BP, she received several doses of IV Hydralazine and IV Labetalol and was ultimately started on IV nitroglycerin. She did not receive diuretics yesterday but has been started on IV Lasix 40 mg twice daily.  In talking with the patient today, she reports having shortness of breath at night for the past 2 to 3 weeks. Reports she has to sit up in bed or in a chair to catch her breath. She has been evaluated by her PCP multiple times for this and completed several rounds of antibiotics and steroids with no improvement in symptoms. Reports a productive cough as well. Reports mild shortness of breath with activity but no significant changes in this. No recent abdominal distention or lower extremity edema. She denies any recent chest pain.  Says that her blood pressure has been significantly elevated with SBP in the 200's at times over the past few weeks and she has been having a headache with this as well. At this time, she reports a significant headache since being started on IV nitroglycerin and is also having some nausea.   Past Medical History:  Diagnosis Date   Acute renal failure (ARF) (HCC) 08/07/2015   Allergy    Anemia    Anxiety    Asthma    pt brought her proair inhaler 06-06-14   Cancer (HCC)    cervical   Colon polyp    hyperplastic  Colon stricture (HCC)    Colonic obstruction (HCC)    Depression    Family history of adverse reaction to anesthesia    pts mother experiences nausea and vomiting    GERD (gastroesophageal reflux disease)    History of bronchitis    History of cervical cancer    Stage IB adenocarcinoma of the cervix, treated with rad hyst, 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   History of chemotherapy    History of hiatal hernia    History of radiation therapy    Hyperlipidemia    Hypertension     Iliac artery occlusion, 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   Migraine    "q couple years maybe" (08/07/2015)   Shortness of breath dyspnea    seasonal     Past Surgical History:  Procedure Laterality Date   ABDOMINAL AORTOGRAM W/LOWER EXTREMITY Bilateral 07/26/2021   Procedure: ABDOMINAL AORTOGRAM W/LOWER EXTREMITY;  Surgeon: Leonie Douglas, MD;  Location: [facility] INVASIVE CV LAB;  Service: Cardiovascular;  Laterality: Bilateral;   AXILLARY-FEMORAL BYPASS GRAFT Right 08/26/2021   Procedure: RIGHT AXILLO-BIFEMORAL BYPASS WITH GORETEC GRAFT;  Surgeon: Leonie Douglas, MD;  Location: [facility] OR;  Service: Vascular;  Laterality: Right;  right axilla to right groin, right groin to left groin   COLON RESECTION N/A 06/27/2015   Procedure: LYSIS OF ADHESIONS LOW ANTERIOR RESECTION DIVERTING LOOP ILEOSTOMY;  Surgeon: Romie Levee, MD;  Location: [facility] ORS;  Service: General;  Laterality: N/A;   COLON SURGERY     COLONOSCOPY     CYSTOSCOPY WITH STENT PLACEMENT Bilateral 06/27/2015   Procedure: CYSTOSCOPY WITH CATHETER  PLACEMENT;  Surgeon: Marcine Matar, MD;  Location: [facility] ORS;  Service: Urology;  Laterality: Bilateral;   DIVERTING ILEOSTOMY N/A 06/27/2015   Procedure: DIVERTING LOOP ILEOSTOMY;  Surgeon: Romie Levee, MD;  Location: [facility] ORS;  Service: General;  Laterality: N/A;   FEMORAL-FEMORAL BYPASS GRAFT Bilateral 08/10/2015   Procedure: BYPASS GRAFT LEFT FEMORALTO RIGHT FEMORAL ARTERY;  Surgeon: Larina Earthly, MD;  Location[facility];  Service: Vascular;  Laterality: Bilateral;   FLEXIBLE SIGMOIDOSCOPY  12/12/2015   Procedure: FLEXIBLE SIGMOIDOSCOPY;  Surgeon: Romie Levee, MD;  Location: [facility] ORS;  Service: General;;   ILEOSTOMY CLOSURE N/A 12/12/2015   Procedure: OPEN ILEOSTOMY REVERSAL AND PELVIC WASHOUT;  Surgeon: Romie Levee, MD;  Location: [facility] ORS;  Service: General;  Laterality: N/A;   INTRAOPERATIVE ARTERIOGRAM Right 08/10/2015   Procedure: INTRA OPERATIVE ARTERIOGRAM Right Iliac Artery;  Surgeon: Larina Earthly, MD;  Location: [facility] OR;  Service: Vascular;  Laterality: Right;   IR GENERIC HISTORICAL  09/12/2015   IR RADIOLOGIST EVAL & MGMT 09/12/2015 Ralene Muskrat, PA-C [facility] INTERV RAD   IR RADIOLOGIST EVAL & MGMT  06/23/2018   IR RADIOLOGIST EVAL & MGMT  07/07/2018   IV antibiotic infusion therapy      PERIPHERAL VASCULAR CATHETERIZATION N/A 08/09/2015   Procedure: Abdominal Aortogram w/Lower Extremity;  Surgeon: Fransisco Hertz, MD;  Location: [facility] INVASIVE CV LAB;  Service: Cardiovascular;  Laterality: N/A;   PERIPHERAL VASCULAR CATHETERIZATION Left 08/09/2015   Procedure: Peripheral Vascular Intervention;  Surgeon: Fransisco Hertz, MD;  Location: [facility] INVASIVE CV LAB;  Service: Cardiovascular;  Laterality: Left;  common iliac   PICC LINE PLACE PERIPHERAL ([facility] HX)     RADICAL ABDOMINAL HYSTERECTOMY  1994   THROMBECTOMY FEMORAL ARTERY Bilateral 09/05/2015   Procedure: THROMBECTOMY Left to Right Femoral to  FEMORAL ARTERY Bypass,.;  Surgeon: Pryor Ochoa, MD;  Location: [facility] OR;  Service: Vascular;  Laterality: Bilateral;   THROMBECTOMY ILIAC ARTERY Right 08/10/2015   Procedure: THROMBECTOMY Right ILIAC ARTERY;  Surgeon: Larina Earthly, MD;  Location: [facility] OR;  Service: Vascular;  Laterality: Right;   TUBAL LIGATION     UPPER GASTROINTESTINAL ENDOSCOPY       Home Medications:  Prior to Admission medications   Medication Sig Start Date End Date Taking? Authorizing Provider  aspirin EC 81 MG tablet Take 81 mg by mouth daily. Swallow whole.    [provider]  carvedilol (COREG) 6.25 MG tablet TAKE 1 TABLET(6.25 MG) BY MOUTH TWICE DAILY 07/31/22   Croitoru, Mihai, MD  ezetimibe (ZETIA) 10 MG tablet Take 1 tablet (10 mg total) by mouth daily. 12/02/22   Croitoru, Mihai, MD  ibuprofen (ADVIL) 200 MG tablet Take 600-800 mg by mouth every 6 (six) hours as needed for moderate pain.    [provider]  phenylephrine (SUDAFED PE) 10 MG TABS tablet Take 10 mg by mouth every 4 (four) hours as needed  (allergies).    [provider]  Probiotic Product (PROBIOTIC PO) Take 1 tablet by mouth every evening.    [provider]  rosuvastatin (CRESTOR) 10 MG tablet Take 1 tablet (10 mg total) by mouth daily. 01/09/23   Croitoru, Mihai, MD  Vitamin D, Ergocalciferol, (DRISDOL) 1.25 MG (50000 UNIT) CAPS capsule Take 50,000 Units by mouth every Saturday. 01/02/21   [provider]    Inpatient Medications: Scheduled Meds:  aspirin EC  81 mg Oral Daily   enoxaparin (LOVENOX) injection  40 mg Subcutaneous Q24H   ezetimibe  10 mg Oral Daily   furosemide  40 mg Intravenous Q12H   rosuvastatin  10 mg Oral Daily   Continuous Infusions:  nitroGLYCERIN 50 mcg/min (02/17/23 0307)   PRN Meds: acetaminophen **OR** acetaminophen, ondansetron **OR** ondansetron (ZOFRAN) IV  Allergies:    Allergies  Allergen Reactions   Codeine Other (See Comments) and Hypertension    "hyper"    Social History:   Social History   Socioeconomic History   Marital status: Married    Spouse name: Not on file   Number of children: 1   Years of education: Not on file   Highest education level: Not on file  Occupational History   Not on file  Tobacco Use   Smoking status: Every Day    Current packs/day: 0.25    Average packs/day: 0.2 packs/day for 47.8 years (11.9 ttl pk-yrs)    Types: Cigarettes    Start date: 05/17/1975   Smokeless tobacco: Never   Tobacco comments:    Less than 5 cigarettes per day  Vaping Use   Vaping status: Former   Devices: e-cigrette with out nicotene for of the ostomy  Substance and Sexual Activity   Alcohol use: No    Alcohol/week: 0.0 standard drinks of alcohol   Drug use: Not Currently    Types: Marijuana    Comment: smoked marijuana last around 07/2021   Sexual activity: Not Currently  Other Topics Concern   Not on file  Social History Narrative   Not on file    Family History:    Family History  Problem Relation Age of Onset   Hypertension  Mother    Hyperparathyroidism Mother    Hypertension Father    CAD Father        CABG   Peripheral vascular disease Father    Graves' disease Father    Breast cancer Maternal Aunt    Breast cancer Maternal  Aunt    Prostate cancer Paternal Uncle    Rectal cancer Cousin    Colon cancer Neg Hx    Stomach cancer Neg Hx    Esophageal cancer Neg Hx    Deep vein thrombosis Neg Hx    Uterine cancer Neg Hx    Endometrial cancer Neg Hx    Pancreatic cancer Neg Hx      ROS:  Please see the history of present illness.   All other ROS reviewed and negative.     Physical Exam/Data:   Vitals:   02/17/23 0300 02/17/23 0440 02/17/23 0500 02/17/23 0700  BP: (!) 151/63 (!) 176/76 (!) 180/90 (!) 192/78  Pulse: 67 76 74 70  Resp: 17 (!) 24 (!) 21 20  Temp:   98.5 F (36.9 C)   TempSrc:      SpO2: 91% 94% 94% 91%  Weight:      Height:        Intake/Output Summary (Last 24 hours) at 02/17/2023 0802 Last data filed at 02/17/2023 0307 Gross per 24 hour  Intake 155.59 ml  Output --  Net 155.59 ml      02/16/2023    1:08 PM 02/01/2023    8:20 PM 04/08/2022    3:31 PM  Last 3 Weights  Weight (lbs) 171 lb 183 lb 190 lb  Weight (kg) 77.565 kg 83.008 kg 86.183 kg     Body mass index is 31.28 kg/m.  General: Pleasant female, sitting up in bed with occasional dry heaves.  HEENT: normal Neck: no JVD Vascular: No carotid bruits; Distal pulses 2+ bilaterally Cardiac:  normal S1, S2; RRR; no murmur  Lungs: rales along bases bilaterally Abd: soft, nontender, no hepatomegaly  Ext: no pitting edema Musculoskeletal:  No deformities, BUE and BLE strength normal and equal Skin: warm and dry  Neuro:  CNs 2-12 intact, no focal abnormalities noted Psych:  Normal affect   Telemetry:  Telemetry was personally reviewed and demonstrates: NSR, HR in 60's to 80's. Brief episodes of what appears most consistent with SVT, longest lasting 8 beats.   Relevant CV Studies:  Coronary CTA:  07/2021 Calcium Score: 305 Agatston units.   Coronary Arteries: Right dominant with no anomalies   LM: Calcified plaque with minimal stenosis.   LAD system: Mixed plaque proximal LAD with mild (25-49%) stenosis.   Circumflex system: Calcified plaque in the mid LCx. Poor contrast opacification, probably < 50% stenosis but difficult to quantify.   RCA system: Calcified plaque with mild (25-49%) stenosis in the proximal RCA and in the distal RCA.   IMPRESSION: 1. Coronary artery calcium score 305 Agatston units. This places the patient in the 97th percentile for age and gender, suggesting high risk for future cardiac events.   2.  Poor quality study due to poor contrast opacification.   3. Probably mild disease in the LAD and RCA. Difficult to quantify the LCx disease but probably not severe. Study does not appear good enough to send for FFR.    Laboratory Data:  High Sensitivity Troponin:   Recent Labs  Lab 02/16/23 1330 02/16/23 1504 02/16/23 1721 02/16/23 1946  TROPONINIHS 221* 354* 225* 187*     Chemistry Recent Labs  Lab 02/16/23 1330 02/17/23 0238  NA 133* 133*  K 3.8 4.2  CL 97* 99  CO2 26 25  GLUCOSE 124* 123*  BUN 19 21  CREATININE 1.07* 1.07*  CALCIUM 8.6* 8.5*  MG  --  2.1  GFRNONAA 59* 59*  ANIONGAP  10 9    Recent Labs  Lab 02/17/23 0238  PROT 5.7*  ALBUMIN 3.1*  AST 10*  ALT 10  ALKPHOS 85  BILITOT 1.2   Lipids No results for input(s): "CHOL", "TRIG", "HDL", "LABVLDL", "LDLCALC", "CHOLHDL" in the last 168 hours.  Hematology Recent Labs  Lab 02/16/23 1330 02/17/23 0238  WBC 29.3* 20.5*  RBC 5.85* 4.86  HGB 15.4* 13.0  HCT 49.5* 40.2  MCV 84.6 82.7  MCH 26.3 26.7  MCHC 31.1 32.3  RDW 13.8 14.2  PLT 327 230   Thyroid No results for input(s): "TSH", "FREET4" in the last 168 hours.  BNP Recent Labs  Lab 02/16/23 1330  BNP 967.0*    DDimer No results for input(s): "DDIMER" in the last 168 hours.   Radiology/Studies:  CT  Angio Chest PE W and/or Wo Contrast  Result Date: 02/16/2023 CLINICAL DATA:  Headache and shortness of breath EXAM: CT ANGIOGRAPHY CHEST WITH CONTRAST TECHNIQUE: Multidetector CT imaging of the chest was performed using the standard protocol during bolus administration of intravenous contrast. Multiplanar CT image reconstructions and MIPs were obtained to evaluate the vascular anatomy. RADIATION DOSE REDUCTION: This exam was performed according to the departmental dose-optimization program which includes automated exposure control, adjustment of the mA and/or kV according to patient size and/or use of iterative reconstruction technique. CONTRAST:  75mL OMNIPAQUE IOHEXOL 350 MG/ML SOLN COMPARISON:  None Available. FINDINGS: Cardiovascular: No evidence of pulmonary embolus. Normal heart size. No No pericardial effusion. Normal caliber thoracic aorta with moderate atherosclerotic disease. Mild coronary artery calcifications. Mediastinum/Nodes: Esophagus and thyroid are unremarkable. Mildly enlarged mediastinal and bilateral hilar lymph nodes, likely reactive. Reference subcarinal lymph node measuring 11 mm on series 5, image 41. Lungs/Pleura: Central airways are patent. Bronchial wall and septal thickening. Small bilateral pleural effusions. Solid nodules of the right middle lobe measuring 14 x 8 mm on series 7 image 80 and 6 mm on image 70. Upper Abdomen: No acute abnormality. Musculoskeletal: Partially visualized vascular stent coursing through the soft tissues of the right chest wall. No acute or significant osseous findings. Review of the MIP images confirms the above findings. IMPRESSION: 1. No evidence of pulmonary embolus. 2. Small bilateral pleural effusions with bronchial wall and septal thickening, likely due to pulmonary edema. 3. Solid nodules of the right middle lobe, largest measuring up to 14 mm. Recommend follow-up chest CT in 3 months. This recommendation follows the consensus statement: Guidelines  for Management of Incidental Pulmonary Nodules Detected on CT Images: From the Fleischner Society 2017; Radiology 2017; 284:228-243. 4. Mildly enlarged mediastinal and bilateral hilar lymph nodes, likely reactive. 5. Aortic Atherosclerosis (ICD10-I70.0). Electronically Signed   By: Allegra Lai M.D.   On: 02/16/2023 18:47   CT Head Wo Contrast  Result Date: 02/16/2023 CLINICAL DATA:  Headache, increasing frequency or severity EXAM: CT HEAD WITHOUT CONTRAST TECHNIQUE: Contiguous axial images were obtained from the base of the skull through the vertex without intravenous contrast. RADIATION DOSE REDUCTION: This exam was performed according to the departmental dose-optimization program which includes automated exposure control, adjustment of the mA and/or kV according to patient size and/or use of iterative reconstruction technique. COMPARISON:  CT Head 02/01/23 FINDINGS: Brain: Negative for an acute infarct. No hemorrhage. No hydrocephalus. No extra-axial fluid collection. There is sequela of moderate chronic microvascular ischemic change. Subcortical hypodensity in the right frontal lobe either represents a chronic infarct or changes related to chronic microvascular ischemic change. There is also a chronic infarct involving the anterior right  temporal lobe. No mass effect. No mass lesion. Vascular: No hyperdense vessel or unexpected calcification. Skull: Normal. Negative for fracture or focal lesion. Sinuses/Orbits: No middle ear or mastoid effusion. Paranasal sinuses are clear. Orbits are unremarkable. Other: None. IMPRESSION: No acute intracranial process. Electronically Signed   By: Lorenza Cambridge M.D.   On: 02/16/2023 18:20   DG Chest 2 View  Result Date: 02/16/2023 CLINICAL DATA:  Shortness of breath for 3 weeks EXAM: CHEST - 2 VIEW COMPARISON:  12/12/2015 FINDINGS: Bilateral mild interstitial thickening. No focal consolidation. No pleural effusion or pneumothorax. Heart and mediastinal contours are  unremarkable. No acute osseous abnormality. IMPRESSION: 1. Bilateral mild interstitial thickening which may reflect mild interstitial edema versus interstitial infection. Electronically Signed   By: Elige Ko M.D.   On: 02/16/2023 15:10     Assessment and Plan:   1. Acute CHF Exacerbation - Her presenting symptoms over the past 2 to 3 weeks are most concerning for orthopnea and PND in the setting of fluid overload as she reports mostly shortness of breath at night despite being treated for "pneumonia" by her PCP with antibiotics and steroids with no improvement in symptoms. - BNP elevated at 967 and CTA shows bilateral pleural effusions. Agree with starting IV Lasix 40 mg twice daily.  Follow I&O's along with daily weights. Echocardiogram is pending and if she is found to have a cardiomyopathy, she will require initiation of GDMT.  2. CAD/Elevated Troponin Values - Prior Coronary CTA in 07/2021 showed mild plaque in the LAD and RCA but it was difficult to assess the LCx. Hs troponin values have been elevated, peaking at 354 this admission. Possibly secondary to demand ischemia in the setting of hypertensive emergency.   - Initial plans were for transfer to [facility] for likely cardiac catheterization once her clinical status improved but in discussion with Dr. Jenene Slicker, she wants her to stay at [facility] until her echocardiogram results given no reports of chest pain.   - She has not been started on Heparin given her flat enzyme trend and no reports of chest pain. Continue ASA, Crestor and Zetia.   3. PAD - She has a history of complex PCI as outlined above and ultimately required femoral thromboendarterectomy and right axillary to bifemoral bypass by Vascular Surgery. - She has been continued on ASA 81 mg daily, Crestor 10 mg daily and Zetia 10 mg daily.  4. Hypertensive Emergency - BP was elevated at 228/108 upon arrival. She is currently on IV Nitroglycerin but is reporting significant  headaches with this which is further elevating her blood pressure due to significant pain. Would anticipate stopping IV nitroglycerin and switching to Nicardipine given intolerances to this. Will also order as needed IV Hydralazine. Allow for gradual BP reduction as this has been significantly elevated for the past 2 to 3 weeks.    5. HLD - LDL was at 64 in 09/2022. Remains on Crestor and Zetia.   6. Leukocytosis - WBC was at 29.3 on admission, improved to 20.5 today. Afebrile.  Possibly due to recent steroid use. Management per the admitting team.   7. Lung Nodules - CT this admission showed solid nodules of the right middle lobe with the largest measuring 14 mm and follow-up Chest CT recommended in 3 months.   Risk Assessment/Risk Scores:    New York Heart Association (NYHA) Functional Class NYHA Class III   For questions or updates, please contact [facility] Please consult [email or web address] for contact info under  Signed, Ellsworth Lennox, PA-C  02/17/2023 8:02 AM

## 2023-02-17 NOTE — Plan of Care (Signed)

## 2023-02-17 NOTE — Plan of Care (Signed)

## 2023-02-17 NOTE — Progress Notes (Addendum)
PROGRESS NOTE    Cathy Jones  UJW:[id number] DOB: [birth year] DOA: 02/16/2023 PCP: Benita Stabile, MD   Brief Narrative:    Cathy Jones is a 62 y.o. female with medical history significant of hypertension, hyperlipidemia who presents to the emergency department due to 2 to 3-week onset of shortness of breath associated with cough, patient was recently diagnosed with bronchitis and was treated with steroids, symptoms momentarily improved, then worsened within the last few days.  This morning, she had increased shortness of breath and elevated blood pressure which was associated with headache treated and patient was taken to the ED for further evaluation and management.  She has been admitted with acute CHF exacerbation in the setting of hypertensive emergency with elevated troponin levels.  She was initially started on nitroglycerin drip and this has been transition to Cardene and cardiology following with 2D echocardiogram pending.  Assessment & Plan:   Principal Problem:   Hypertensive emergency Active Problems:   Essential hypertension   Mixed hyperlipidemia   Acute pulmonary edema (HCC)   Elevated brain natriuretic peptide (BNP) level   Elevated troponin   Headache   Lung nodules  Assessment and Plan:   Hypertensive emergency Essential hypertension Continue now on IV Cardene drip Consider transitioning patient to oral meds when BP becomes better controlled   Acute CHF exacerbation Continue IV Lasix 40 mg twice daily 2D echocardiogram ordered and pending   Elevated troponin possibly secondary to type II demand ischemia Troponin is flattened-  221 > 354 > 225 > 187.   Appreciate ongoing cardiology evaluation with 2D echocardiogram pending as noted above   Headache This may be due to patient's elevated BP and/or nitroglycerin Continue Tylenol as needed   Incidental finding of lung nodules CT of your chest showed solid nodules of the right middle lobe, largest  measuring up to 14 mm. Chest CT in 3 months was recommended per radiologist   Mixed hyperlipidemia Continue Crestor, Zetia   Leukocytosis-improving Likely related to recent steroid use, continue to monitor  Obesity BMI 31.28   DVT prophylaxis:Lovenox Code Status: Full Family Communication: None at bedside Disposition Plan: Continue blood pressure management and diuresis per cardiology Status is: Inpatient Remains inpatient appropriate because: IV medications  Consultants:  Cardiology  Procedures:  None  Antimicrobials:  None   Subjective: Patient seen and evaluated today with complaints of headache this morning while on nitroglycerin drip.  Blood pressures continue to remain elevated.  She denies any chest pain or shortness of breath.  Objective: Vitals:   02/17/23 0220 02/17/23 0300 02/17/23 0440 02/17/23 0500  BP: (!) 162/82 (!) 151/63 (!) 176/76 (!) 180/90  Pulse: 71 67 76 74  Resp: (!) 21 17 (!) 24 (!) 21  Temp:    98.5 F (36.9 C)  TempSrc:      SpO2: 92% 91% 94% 94%  Weight:      Height:        Intake/Output Summary (Last 24 hours) at 02/17/2023 0735 Last data filed at 02/17/2023 0307 Gross per 24 hour  Intake 155.59 ml  Output --  Net 155.59 ml   Filed Weights   02/16/23 1308  Weight: 77.6 kg    Examination:  General exam: Appears calm and comfortable  Respiratory system: Clear to auscultation. Respiratory effort normal. Cardiovascular system: S1 & S2 heard, RRR.  Gastrointestinal system: Abdomen is soft Central nervous system: Alert and awake Extremities: No edema Skin: No significant lesions noted Psychiatry: Flat affect.  Data Reviewed: I have personally reviewed following labs and imaging studies  CBC: Recent Labs  Lab 02/16/23 1330 02/17/23 0238  WBC 29.3* 20.5*  NEUTROABS 20.2*  --   HGB 15.4* 13.0  HCT 49.5* 40.2  MCV 84.6 82.7  PLT 327 230   Basic Metabolic Panel: Recent Labs  Lab 02/16/23 1330 02/17/23 0238  NA  133* 133*  K 3.8 4.2  CL 97* 99  CO2 26 25  GLUCOSE 124* 123*  BUN 19 21  CREATININE 1.07* 1.07*  CALCIUM 8.6* 8.5*  MG  --  2.1  PHOS  --  4.4   GFR: Estimated Creatinine Clearance: 53.3 mL/min (A) (by C-G formula based on SCr of 1.07 mg/dL (H)). Liver Function Tests: Recent Labs  Lab 02/17/23 0238  AST 10*  ALT 10  ALKPHOS 85  BILITOT 1.2  PROT 5.7*  ALBUMIN 3.1*   No results for input(s): "LIPASE", "AMYLASE" in the last 168 hours. No results for input(s): "AMMONIA" in the last 168 hours. Coagulation Profile: No results for input(s): "INR", "PROTIME" in the last 168 hours. Cardiac Enzymes: No results for input(s): "CKTOTAL", "CKMB", "CKMBINDEX", "TROPONINI" in the last 168 hours. BNP (last 3 results) No results for input(s): "PROBNP" in the last 8760 hours. HbA1C: No results for input(s): "HGBA1C" in the last 72 hours. CBG: No results for input(s): "GLUCAP" in the last 168 hours. Lipid Profile: No results for input(s): "CHOL", "HDL", "LDLCALC", "TRIG", "CHOLHDL", "LDLDIRECT" in the last 72 hours. Thyroid Function Tests: No results for input(s): "TSH", "T4TOTAL", "FREET4", "T3FREE", "THYROIDAB" in the last 72 hours. Anemia Panel: No results for input(s): "VITAMINB12", "FOLATE", "FERRITIN", "TIBC", "IRON", "RETICCTPCT" in the last 72 hours. Sepsis Labs: No results for input(s): "PROCALCITON", "LATICACIDVEN" in the last 168 hours.  No results found for this or any previous visit (from the past 240 hour(s)).       Radiology Studies: CT Angio Chest PE W and/or Wo Contrast  Result Date: 02/16/2023 CLINICAL DATA:  Headache and shortness of breath EXAM: CT ANGIOGRAPHY CHEST WITH CONTRAST TECHNIQUE: Multidetector CT imaging of the chest was performed using the standard protocol during bolus administration of intravenous contrast. Multiplanar CT image reconstructions and MIPs were obtained to evaluate the vascular anatomy. RADIATION DOSE REDUCTION: This exam was  performed according to the departmental dose-optimization program which includes automated exposure control, adjustment of the mA and/or kV according to patient size and/or use of iterative reconstruction technique. CONTRAST:  75mL OMNIPAQUE IOHEXOL 350 MG/ML SOLN COMPARISON:  None Available. FINDINGS: Cardiovascular: No evidence of pulmonary embolus. Normal heart size. No No pericardial effusion. Normal caliber thoracic aorta with moderate atherosclerotic disease. Mild coronary artery calcifications. Mediastinum/Nodes: Esophagus and thyroid are unremarkable. Mildly enlarged mediastinal and bilateral hilar lymph nodes, likely reactive. Reference subcarinal lymph node measuring 11 mm on series 5, image 41. Lungs/Pleura: Central airways are patent. Bronchial wall and septal thickening. Small bilateral pleural effusions. Solid nodules of the right middle lobe measuring 14 x 8 mm on series 7 image 80 and 6 mm on image 70. Upper Abdomen: No acute abnormality. Musculoskeletal: Partially visualized vascular stent coursing through the soft tissues of the right chest wall. No acute or significant osseous findings. Review of the MIP images confirms the above findings. IMPRESSION: 1. No evidence of pulmonary embolus. 2. Small bilateral pleural effusions with bronchial wall and septal thickening, likely due to pulmonary edema. 3. Solid nodules of the right middle lobe, largest measuring up to 14 mm. Recommend follow-up chest CT in 3 months.  This recommendation follows the consensus statement: Guidelines for Management of Incidental Pulmonary Nodules Detected on CT Images: From the Fleischner Society 2017; Radiology 2017; 284:228-243. 4. Mildly enlarged mediastinal and bilateral hilar lymph nodes, likely reactive. 5. Aortic Atherosclerosis (ICD10-I70.0). Electronically Signed   By: Allegra Lai M.D.   On: 02/16/2023 18:47   CT Head Wo Contrast  Result Date: 02/16/2023 CLINICAL DATA:  Headache, increasing frequency or  severity EXAM: CT HEAD WITHOUT CONTRAST TECHNIQUE: Contiguous axial images were obtained from the base of the skull through the vertex without intravenous contrast. RADIATION DOSE REDUCTION: This exam was performed according to the departmental dose-optimization program which includes automated exposure control, adjustment of the mA and/or kV according to patient size and/or use of iterative reconstruction technique. COMPARISON:  CT Head 02/01/23 FINDINGS: Brain: Negative for an acute infarct. No hemorrhage. No hydrocephalus. No extra-axial fluid collection. There is sequela of moderate chronic microvascular ischemic change. Subcortical hypodensity in the right frontal lobe either represents a chronic infarct or changes related to chronic microvascular ischemic change. 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 No mass effect. No mass lesion. Vascular: No hyperdense vessel or unexpected calcification. Skull: Normal. Negative for fracture or focal lesion. Sinuses/Orbits: No middle ear or mastoid effusion. Paranasal sinuses are clear. Orbits are unremarkable. Other: None. IMPRESSION: No acute intracranial process. Electronically Signed   By: Lorenza Cambridge M.D.   On: 02/16/2023 18:20   DG Chest 2 View  Result Date: 02/16/2023 CLINICAL DATA:  Shortness of breath for 3 weeks EXAM: CHEST - 2 VIEW COMPARISON:  12/12/2015 FINDINGS: Bilateral mild interstitial thickening. No focal consolidation. No pleural effusion or pneumothorax. Heart and mediastinal contours are unremarkable. No acute osseous abnormality. IMPRESSION: 1. Bilateral mild interstitial thickening which may reflect mild interstitial edema versus interstitial infection. Electronically Signed   By: Elige Ko M.D.   On: 02/16/2023 15:10        Scheduled Meds:  aspirin EC  81 mg Oral Daily   enoxaparin (LOVENOX) injection  40 mg Subcutaneous Q24H   ezetimibe  10 mg Oral Daily   furosemide  40 mg Intravenous Q12H    rosuvastatin  10 mg Oral Daily   Continuous Infusions:  nitroGLYCERIN 50 mcg/min (02/17/23 0307)     LOS: 1 day    Time spent: 35 minutes    Chukwudi Ewen Hoover Brunette, DO [facility]  If 7PM-7AM, please contact night-coverage [email or web address] 02/17/2023, 7:35 AM

## 2023-02-17 NOTE — Progress Notes (Signed)
   02/17/23 1030  TOC Brief Assessment  Insurance and Status Reviewed  Patient has primary care physician Yes  Home environment has been reviewed Home with spouse  Prior level of function: Independent  Prior/Current Home Services No current home services  Social Determinants of Health Reivew SDOH reviewed no interventions necessary  Readmission risk has been reviewed Yes  Transition of care needs no transition of care needs at this time   Transition of Care Department [facility]) has reviewed patient and no TOC needs have been identified at this time. We will continue to monitor patient advancement through interdisciplinary progression rounds. If new patient transition needs arise, please place a TOC consult.

## 2023-02-17 NOTE — ED Notes (Signed)
Cardiology PA at bedside and approved Pt to drink water.

## 2023-02-18 DIAGNOSIS — I161 Hypertensive emergency: Secondary | ICD-10-CM | POA: Diagnosis not present

## 2023-02-18 DIAGNOSIS — I2510 Atherosclerotic heart disease of native coronary artery without angina pectoris: Secondary | ICD-10-CM

## 2023-02-18 LAB — BASIC METABOLIC PANEL
Anion gap: 10 (ref 5–15)
BUN: 20 mg/dL (ref 8–23)
CO2: 28 mmol/L (ref 22–32)
Calcium: 8.4 mg/dL — ABNORMAL LOW (ref 8.9–10.3)
Chloride: 95 mmol/L — ABNORMAL LOW (ref 98–111)
Creatinine, Ser: 1.22 mg/dL — ABNORMAL HIGH (ref 0.44–1.00)
GFR, Estimated: 50 mL/min — ABNORMAL LOW (ref >60)
Glucose, Bld: 103 mg/dL — ABNORMAL HIGH (ref 70–99)
Potassium: 4 mmol/L (ref 3.5–5.1)
Sodium: 133 mmol/L — ABNORMAL LOW (ref 135–145)

## 2023-02-18 LAB — CBC
HCT: 43.6 % (ref 36.0–46.0)
Hemoglobin: 13.7 g/dL (ref 12.0–15.0)
MCH: 26.4 pg (ref 26.0–34.0)
MCHC: 31.4 g/dL (ref 30.0–36.0)
MCV: 84.2 fL (ref 80.0–100.0)
Platelets: 229 10*3/uL (ref 150–400)
RBC: 5.18 MIL/uL — ABNORMAL HIGH (ref 3.87–5.11)
RDW: 14.1 % (ref 11.5–15.5)
WBC: 15.1 10*3/uL — ABNORMAL HIGH (ref 4.0–10.5)
nRBC: 0 % (ref 0.0–0.2)

## 2023-02-18 LAB — BRAIN NATRIURETIC PEPTIDE: B Natriuretic Peptide: 317 pg/mL — ABNORMAL HIGH (ref 0.0–100.0)

## 2023-02-18 LAB — MAGNESIUM: Magnesium: 2.2 mg/dL (ref 1.7–2.4)

## 2023-02-18 MED ORDER — FUROSEMIDE 20 MG PO TABS: 20.0000 mg | ORAL_TABLET | Freq: Every day | ORAL | Status: DC

## 2023-02-18 MED ORDER — CARVEDILOL 12.5 MG PO TABS
12.5000 mg | ORAL_TABLET | Freq: Two times a day (BID) | ORAL | Status: DC
  Administered 2023-02-18 – 2023-02-19 (×3): 12.5 mg via ORAL
  Filled 2023-02-18 (×3): qty 1

## 2023-02-18 MED ORDER — FUROSEMIDE 10 MG/ML IJ SOLN
40.0000 mg | Freq: Once | INTRAMUSCULAR | Status: AC
  Administered 2023-02-18: 40 mg via INTRAVENOUS
  Filled 2023-02-18: qty 4

## 2023-02-18 MED ORDER — LOSARTAN POTASSIUM 50 MG PO TABS
50.0000 mg | ORAL_TABLET | Freq: Every day | ORAL | Status: DC
  Administered 2023-02-18 – 2023-02-19 (×2): 50 mg via ORAL
  Filled 2023-02-18 (×2): qty 1

## 2023-02-18 MED ORDER — FUROSEMIDE 40 MG PO TABS
40.0000 mg | ORAL_TABLET | Freq: Every day | ORAL | Status: DC
  Administered 2023-02-19: 40 mg via ORAL
  Filled 2023-02-18: qty 1

## 2023-02-18 MED ORDER — CHLORTHALIDONE 25 MG PO TABS: 25.0000 mg | ORAL_TABLET | Freq: Every day | ORAL | Status: DC

## 2023-02-18 NOTE — Plan of Care (Signed)

## 2023-02-18 NOTE — Progress Notes (Signed)
Output 1650 ml  Net 24.27 ml   Filed Weights   02/16/23 1308 02/17/23 1100 02/18/23 0500  Weight: 77.6 kg 80.6 kg 79 kg        General:  AAO x 3,  cooperative, no distress;   HEENT:  Normocephalic, PERRL, otherwise with in Normal limits   Neuro:  CNII-XII intact. , normal motor and sensation,  reflexes intact   Lungs:   Clear to auscultation BL, Respirations unlabored,  No wheezes / crackles  Cardio:    S1/S2, irregularly irregular, No murmure, No Rubs or Gallops   Abdomen:  Soft, non-tender, bowel sounds active all four quadrants, no guarding or peritoneal signs.  Muscular  skeletal:  Limited exam -global generalized weaknesses - in bed, able to move all 4 extremities,   2+ pulses,  symmetric, No pitting edema  Skin:  Dry, warm to touch, negative for any Rashes,  Wounds: Please see nursing documentation           Data Reviewed: I have personally reviewed following labs and imaging studies  CBC: Recent Labs  Lab 02/16/23 1330 02/17/23 0238 02/18/23 0444  WBC 29.3* 20.5* 15.1*  NEUTROABS 20.2*  --   --   HGB 15.4* 13.0 13.7  HCT 49.5* 40.2 43.6  MCV 84.6 82.7 84.2  PLT 327 230 229   Basic Metabolic Panel: Recent Labs  Lab 02/16/23 1330 02/17/23 0238 02/18/23 0444  NA 133* 133* 133*  K 3.8 4.2 4.0  CL 97* 99 95*  CO2 26 25 28   GLUCOSE 124* 123* 103*  BUN 19 21 20   CREATININE 1.07* 1.07* 1.22*  CALCIUM 8.6* 8.5* 8.4*  MG  --  2.1 2.2  PHOS  --  4.4  --    GFR: Estimated Creatinine Clearance: 47.2 mL/min (A) (by C-G formula based on SCr of 1.22 mg/dL (H)). Liver Function Tests: Recent Labs  Lab 02/17/23 0238  AST 10*  ALT 10  ALKPHOS 85  BILITOT 1.2  PROT 5.7*  ALBUMIN 3.1*    Recent Results (from the past 240 hour(s))  MRSA Next Gen by PCR, Nasal     Status: None   Collection Time: 02/17/23 11:15 AM   Specimen: Nasal Mucosa; Nasal Swab  Result Value Ref Range Status   MRSA by PCR Next Gen NOT DETECTED NOT DETECTED Final    Comment: (NOTE) The GeneXpert MRSA Assay (FDA approved for NASAL specimens only), is one component of a comprehensive MRSA colonization surveillance program. It is not intended to diagnose MRSA infection nor to guide or monitor treatment for MRSA infections. Test performance is not FDA approved in patients less  than 57 years old. Performed at [facility], [address] [place], [place] [address]          Radiology Studies: ECHOCARDIOGRAM COMPLETE  Result Date: 02/17/2023    ECHOCARDIOGRAM REPORT   Patient Name:   Cathy Jones Date of Exam: 02/17/2023 Medical Rec #:  [id number]       Height:       62.0 in Accession #:    [id number]      Weight:       171.0 lb Date of Birth:  Mar 23, 1961        BSA:          1.789 m Patient Age:    62 years        BP:           172/65 mmHg Patient Gender: F  PROGRESS NOTE    Cathy Jones  ZOX:[id number] DOB: [birth year] DOA: 02/16/2023 PCP: Benita Stabile, MD   Brief Narrative:    Cathy Jones is a 62 y.o. female with medical history significant of hypertension, hyperlipidemia who presents to the emergency department due to 2 to 3-week onset of shortness of breath associated with cough, patient was recently diagnosed with bronchitis and was treated with steroids, symptoms momentarily improved, then worsened within the last few days.  This morning, she had increased shortness of breath and elevated blood pressure which was associated with headache treated and patient was taken to the ED for further evaluation and management.  She has been admitted with acute CHF exacerbation in the setting of hypertensive emergency with elevated troponin levels.  She was initially started on nitroglycerin drip and this has been transition to Cardene and cardiology following with 2D echocardiogram pending.   Subjective: The patient was seen and examined this morning, stable sitting up in chair, still on nicardipine drip Denies any chest pain or shortness of breath Overnight blood pressure spiked again, and according Brein drip was restarted.   Assessment & Plan:   Principal Problem:   Hypertensive emergency Active Problems:   Essential hypertension   Mixed hyperlipidemia   Acute pulmonary edema (HCC)   Elevated brain natriuretic peptide (BNP) level   Elevated troponin   Headache   Lung nodules  Assessment and Plan:   Hypertensive emergency Essential hypertension Continue now on IV Cardene drip--try to wean off, overnight blood pressure spiked again, nicardipine had to be retitrated Consider transitioning patient to oral meds when BP becomes better controlled -Per cardiology increasing carvedilol from 12.5 to 25 mg twice a day, losartan 50 mg once a day Switching IV Lasix 40 mg to p.o. daily starting tomorrow   Acute CHF exacerbation Continue IV  Lasix 40 mg twice daily >>> Per cardiology switching to p.o. Lasix in a.m. 2D echocardiogram reviewed normal EF, normal LV function, mild aortic regurgitation Continue aspirin and rosuvastatin   Elevated troponin possibly secondary to type II demand ischemia Denies any chest pain no overt EKG changes Troponin is flattened-  221 > 354 > 225 > 187.   Appreciate ongoing cardiology : Recommending continuing aspirin, statins, increasing carvedilol   Headache -Resolved This may be due to patient's elevated BP and/or nitroglycerin Continue Tylenol as needed   Incidental finding of lung nodules CT of your chest showed solid nodules of the right middle lobe, largest measuring up to 14 mm. Chest CT in 3 months was recommended per radiologist   Mixed hyperlipidemia Continue Crestor, Zetia   Leukocytosis-improving Likely related to recent steroid use, continue to monitor  Obesity BMI 31.28   DVT prophylaxis:Lovenox Code Status: Full Family Communication: None at bedside Disposition Plan: Continue blood pressure management and diuresis per cardiology Status is: Inpatient Remains inpatient appropriate because: IV medications  Consultants:  Cardiology  Procedures:  None  Antimicrobials:  None    Objective: Vitals:   02/18/23 0945 02/18/23 1015 02/18/23 1030 02/18/23 1207  BP: (!) 153/61 (!) 162/66 (!) 159/54   Pulse: 85 80 86   Resp: 20 17 18    Temp:    98.8 F (37.1 C)  TempSrc:    Oral  SpO2: 92% 94% 97%   Weight:      Height:        Intake/Output Summary (Last 24 hours) at 02/18/2023 1314 Last data filed at 02/18/2023 1243 Gross per 24 hour  Intake 1674.27 ml  Output 1650 ml  Net 24.27 ml   Filed Weights   02/16/23 1308 02/17/23 1100 02/18/23 0500  Weight: 77.6 kg 80.6 kg 79 kg        General:  AAO x 3,  cooperative, no distress;   HEENT:  Normocephalic, PERRL, otherwise with in Normal limits   Neuro:  CNII-XII intact. , normal motor and sensation,  reflexes intact   Lungs:   Clear to auscultation BL, Respirations unlabored,  No wheezes / crackles  Cardio:    S1/S2, irregularly irregular, No murmure, No Rubs or Gallops   Abdomen:  Soft, non-tender, bowel sounds active all four quadrants, no guarding or peritoneal signs.  Muscular  skeletal:  Limited exam -global generalized weaknesses - in bed, able to move all 4 extremities,   2+ pulses,  symmetric, No pitting edema  Skin:  Dry, warm to touch, negative for any Rashes,  Wounds: Please see nursing documentation           Data Reviewed: I have personally reviewed following labs and imaging studies  CBC: Recent Labs  Lab 02/16/23 1330 02/17/23 0238 02/18/23 0444  WBC 29.3* 20.5* 15.1*  NEUTROABS 20.2*  --   --   HGB 15.4* 13.0 13.7  HCT 49.5* 40.2 43.6  MCV 84.6 82.7 84.2  PLT 327 230 229   Basic Metabolic Panel: Recent Labs  Lab 02/16/23 1330 02/17/23 0238 02/18/23 0444  NA 133* 133* 133*  K 3.8 4.2 4.0  CL 97* 99 95*  CO2 26 25 28   GLUCOSE 124* 123* 103*  BUN 19 21 20   CREATININE 1.07* 1.07* 1.22*  CALCIUM 8.6* 8.5* 8.4*  MG  --  2.1 2.2  PHOS  --  4.4  --    GFR: Estimated Creatinine Clearance: 47.2 mL/min (A) (by C-G formula based on SCr of 1.22 mg/dL (H)). Liver Function Tests: Recent Labs  Lab 02/17/23 0238  AST 10*  ALT 10  ALKPHOS 85  BILITOT 1.2  PROT 5.7*  ALBUMIN 3.1*    Recent Results (from the past 240 hour(s))  MRSA Next Gen by PCR, Nasal     Status: None   Collection Time: 02/17/23 11:15 AM   Specimen: Nasal Mucosa; Nasal Swab  Result Value Ref Range Status   MRSA by PCR Next Gen NOT DETECTED NOT DETECTED Final    Comment: (NOTE) The GeneXpert MRSA Assay (FDA approved for NASAL specimens only), is one component of a comprehensive MRSA colonization surveillance program. It is not intended to diagnose MRSA infection nor to guide or monitor treatment for MRSA infections. Test performance is not FDA approved in patients less  than 57 years old. Performed at [facility], [address] [place], [place] [address]          Radiology Studies: ECHOCARDIOGRAM COMPLETE  Result Date: 02/17/2023    ECHOCARDIOGRAM REPORT   Patient Name:   Cathy Jones Date of Exam: 02/17/2023 Medical Rec #:  [id number]       Height:       62.0 in Accession #:    [id number]      Weight:       171.0 lb Date of Birth:  Mar 23, 1961        BSA:          1.789 m Patient Age:    62 years        BP:           172/65 mmHg Patient Gender: F  Output 1650 ml  Net 24.27 ml   Filed Weights   02/16/23 1308 02/17/23 1100 02/18/23 0500  Weight: 77.6 kg 80.6 kg 79 kg        General:  AAO x 3,  cooperative, no distress;   HEENT:  Normocephalic, PERRL, otherwise with in Normal limits   Neuro:  CNII-XII intact. , normal motor and sensation,  reflexes intact   Lungs:   Clear to auscultation BL, Respirations unlabored,  No wheezes / crackles  Cardio:    S1/S2, irregularly irregular, No murmure, No Rubs or Gallops   Abdomen:  Soft, non-tender, bowel sounds active all four quadrants, no guarding or peritoneal signs.  Muscular  skeletal:  Limited exam -global generalized weaknesses - in bed, able to move all 4 extremities,   2+ pulses,  symmetric, No pitting edema  Skin:  Dry, warm to touch, negative for any Rashes,  Wounds: Please see nursing documentation           Data Reviewed: I have personally reviewed following labs and imaging studies  CBC: Recent Labs  Lab 02/16/23 1330 02/17/23 0238 02/18/23 0444  WBC 29.3* 20.5* 15.1*  NEUTROABS 20.2*  --   --   HGB 15.4* 13.0 13.7  HCT 49.5* 40.2 43.6  MCV 84.6 82.7 84.2  PLT 327 230 229   Basic Metabolic Panel: Recent Labs  Lab 02/16/23 1330 02/17/23 0238 02/18/23 0444  NA 133* 133* 133*  K 3.8 4.2 4.0  CL 97* 99 95*  CO2 26 25 28   GLUCOSE 124* 123* 103*  BUN 19 21 20   CREATININE 1.07* 1.07* 1.22*  CALCIUM 8.6* 8.5* 8.4*  MG  --  2.1 2.2  PHOS  --  4.4  --    GFR: Estimated Creatinine Clearance: 47.2 mL/min (A) (by C-G formula based on SCr of 1.22 mg/dL (H)). Liver Function Tests: Recent Labs  Lab 02/17/23 0238  AST 10*  ALT 10  ALKPHOS 85  BILITOT 1.2  PROT 5.7*  ALBUMIN 3.1*    Recent Results (from the past 240 hour(s))  MRSA Next Gen by PCR, Nasal     Status: None   Collection Time: 02/17/23 11:15 AM   Specimen: Nasal Mucosa; Nasal Swab  Result Value Ref Range Status   MRSA by PCR Next Gen NOT DETECTED NOT DETECTED Final    Comment: (NOTE) The GeneXpert MRSA Assay (FDA approved for NASAL specimens only), is one component of a comprehensive MRSA colonization surveillance program. It is not intended to diagnose MRSA infection nor to guide or monitor treatment for MRSA infections. Test performance is not FDA approved in patients less  than 57 years old. Performed at [facility], [address] [place], [place] [address]          Radiology Studies: ECHOCARDIOGRAM COMPLETE  Result Date: 02/17/2023    ECHOCARDIOGRAM REPORT   Patient Name:   Cathy Jones Date of Exam: 02/17/2023 Medical Rec #:  [id number]       Height:       62.0 in Accession #:    [id number]      Weight:       171.0 lb Date of Birth:  Mar 23, 1961        BSA:          1.789 m Patient Age:    62 years        BP:           172/65 mmHg Patient Gender: F  Output 1650 ml  Net 24.27 ml   Filed Weights   02/16/23 1308 02/17/23 1100 02/18/23 0500  Weight: 77.6 kg 80.6 kg 79 kg        General:  AAO x 3,  cooperative, no distress;   HEENT:  Normocephalic, PERRL, otherwise with in Normal limits   Neuro:  CNII-XII intact. , normal motor and sensation,  reflexes intact   Lungs:   Clear to auscultation BL, Respirations unlabored,  No wheezes / crackles  Cardio:    S1/S2, irregularly irregular, No murmure, No Rubs or Gallops   Abdomen:  Soft, non-tender, bowel sounds active all four quadrants, no guarding or peritoneal signs.  Muscular  skeletal:  Limited exam -global generalized weaknesses - in bed, able to move all 4 extremities,   2+ pulses,  symmetric, No pitting edema  Skin:  Dry, warm to touch, negative for any Rashes,  Wounds: Please see nursing documentation           Data Reviewed: I have personally reviewed following labs and imaging studies  CBC: Recent Labs  Lab 02/16/23 1330 02/17/23 0238 02/18/23 0444  WBC 29.3* 20.5* 15.1*  NEUTROABS 20.2*  --   --   HGB 15.4* 13.0 13.7  HCT 49.5* 40.2 43.6  MCV 84.6 82.7 84.2  PLT 327 230 229   Basic Metabolic Panel: Recent Labs  Lab 02/16/23 1330 02/17/23 0238 02/18/23 0444  NA 133* 133* 133*  K 3.8 4.2 4.0  CL 97* 99 95*  CO2 26 25 28   GLUCOSE 124* 123* 103*  BUN 19 21 20   CREATININE 1.07* 1.07* 1.22*  CALCIUM 8.6* 8.5* 8.4*  MG  --  2.1 2.2  PHOS  --  4.4  --    GFR: Estimated Creatinine Clearance: 47.2 mL/min (A) (by C-G formula based on SCr of 1.22 mg/dL (H)). Liver Function Tests: Recent Labs  Lab 02/17/23 0238  AST 10*  ALT 10  ALKPHOS 85  BILITOT 1.2  PROT 5.7*  ALBUMIN 3.1*    Recent Results (from the past 240 hour(s))  MRSA Next Gen by PCR, Nasal     Status: None   Collection Time: 02/17/23 11:15 AM   Specimen: Nasal Mucosa; Nasal Swab  Result Value Ref Range Status   MRSA by PCR Next Gen NOT DETECTED NOT DETECTED Final    Comment: (NOTE) The GeneXpert MRSA Assay (FDA approved for NASAL specimens only), is one component of a comprehensive MRSA colonization surveillance program. It is not intended to diagnose MRSA infection nor to guide or monitor treatment for MRSA infections. Test performance is not FDA approved in patients less  than 57 years old. Performed at [facility], [address] [place], [place] [address]          Radiology Studies: ECHOCARDIOGRAM COMPLETE  Result Date: 02/17/2023    ECHOCARDIOGRAM REPORT   Patient Name:   Cathy Jones Date of Exam: 02/17/2023 Medical Rec #:  [id number]       Height:       62.0 in Accession #:    [id number]      Weight:       171.0 lb Date of Birth:  Mar 23, 1961        BSA:          1.789 m Patient Age:    62 years        BP:           172/65 mmHg Patient Gender: F

## 2023-02-18 NOTE — Progress Notes (Addendum)
Progress Note  Patient Name: Cathy Jones Date of Encounter: 02/18/2023  Primary Cardiologist: Thurmon Fair, MD  Subjective   No acute events overnight, try to wean off the nicardipine drip last night with the BP went up.  Nicardipine drip was resumed.  Inpatient Medications    Scheduled Meds:  aspirin EC  81 mg Oral Daily   carvedilol  12.5 mg Oral BID WC   Chlorhexidine Gluconate Cloth  6 each Topical Q0600   chlorthalidone  25 mg Oral Daily   enoxaparin (LOVENOX) injection  40 mg Subcutaneous Q24H   ezetimibe  10 mg Oral Daily   furosemide  40 mg Intravenous Q12H   rosuvastatin  10 mg Oral Daily   Continuous Infusions:  niCARDipine 5 mg/hr (02/18/23 0805)   PRN Meds: acetaminophen **OR** acetaminophen, butalbital-acetaminophen-caffeine, hydrALAZINE, ondansetron **OR** ondansetron (ZOFRAN) IV   Vital Signs    Vitals:   02/18/23 0815 02/18/23 0900 02/18/23 0915 02/18/23 0930  BP: (!) 169/62 (!) 157/68 (!) 153/55 (!) 151/60  Pulse: 100 88 81 80  Resp: 13 17 17 18   Temp:      TempSrc:      SpO2: 97% 93% 91% 91%  Weight:      Height:        Intake/Output Summary (Last 24 hours) at 02/18/2023 1024 Last data filed at 02/18/2023 0842 Gross per 24 hour  Intake 1726.21 ml  Output 1650 ml  Net 76.21 ml   Filed Weights   02/16/23 1308 02/17/23 1100 02/18/23 0500  Weight: 77.6 kg 80.6 kg 79 kg    Telemetry     Personally reviewed, NSR.  ECG   Not performed today  Physical Exam   GEN: No acute distress.   Neck: No JVD. Cardiac: RRR, no murmur, rub, or gallop.  Respiratory: Nonlabored. Clear to auscultation bilaterally. GI: Soft, nontender, bowel sounds present. MS: No edema; No deformity. Neuro:  Nonfocal. Psych: Alert and oriented x 3. Normal affect.  Labs    Chemistry Recent Labs  Lab 02/16/23 1330 02/17/23 0238 02/18/23 0444  NA 133* 133* 133*  K 3.8 4.2 4.0  CL 97* 99 95*  CO2 26 25 28   GLUCOSE 124* 123* 103*  BUN 19 21 20    CREATININE 1.07* 1.07* 1.22*  CALCIUM 8.6* 8.5* 8.4*  PROT  --  5.7*  --   ALBUMIN  --  3.1*  --   AST  --  10*  --   ALT  --  10  --   ALKPHOS  --  85  --   BILITOT  --  1.2  --   GFRNONAA 59* 59* 50*  ANIONGAP 10 9 10      Hematology Recent Labs  Lab 02/16/23 1330 02/17/23 0238 02/18/23 0444  WBC 29.3* 20.5* 15.1*  RBC 5.85* 4.86 5.18*  HGB 15.4* 13.0 13.7  HCT 49.5* 40.2 43.6  MCV 84.6 82.7 84.2  MCH 26.3 26.7 26.4  MCHC 31.1 32.3 31.4  RDW 13.8 14.2 14.1  PLT 327 230 229    Cardiac Enzymes Recent Labs  Lab 02/16/23 1330 02/16/23 1504 02/16/23 1721 02/16/23 1946  TROPONINIHS 221* 354* 225* 187*    BNP Recent Labs  Lab 02/16/23 1330 02/18/23 0444  BNP 967.0* 317.0*     DDimerNo results for input(s): "DDIMER" in the last 168 hours.   Radiology    ECHOCARDIOGRAM COMPLETE  Result Date: 02/17/2023    ECHOCARDIOGRAM REPORT   Patient Name:   Cathy Jones Date of  Exam: 02/17/2023 Medical Rec #:  [id number]       Height:       62.0 in Accession #:    [id number]      Weight:       171.0 lb Date of Birth:  Nov 02, 1960        BSA:          1.789 m Patient Age:    62 years        BP:           172/65 mmHg Patient Gender: F               HR:           100 bpm. [facility] Procedure: 2D Echo, Cardiac Doppler, Color Doppler and Intracardiac            Opacification Agent Indications:    CHF-Acute Diastolic I50.31  History:        Patient has no prior history of Echocardiogram examinations.                 Risk Factors:Hypertension.  Sonographer:    Darlys Gales Referring Phys: [id number] OLADAPO ADEFESO IMPRESSIONS  1. Left ventricular ejection fraction, by estimation, is 60 to 65%. The left ventricle has normal function. The left ventricle has no regional wall motion abnormalities. Left ventricular diastolic parameters are indeterminate. Elevated left ventricular end-diastolic pressure.  2. Right ventricular systolic function is normal. The right ventricular size  is normal. Tricuspid regurgitation signal is inadequate for assessing PA pressure.  3. Left atrial size was mildly dilated.  4. The mitral valve is grossly normal. Trivial mitral valve regurgitation. No evidence of mitral stenosis.  5. The aortic valve was not well visualized. Aortic valve regurgitation is mild. No aortic stenosis is present.  6. The inferior vena cava is dilated in size with <50% respiratory variability, suggesting right atrial pressure of 15 mmHg. Comparison(s): No prior Echocardiogram. FINDINGS  Left Ventricle: Left ventricular ejection fraction, by estimation, is 60 to 65%. The left ventricle has normal function. The left ventricle has no regional wall motion abnormalities. Definity contrast agent was given IV to delineate the left ventricular  endocardial borders. The left ventricular internal cavity size was normal in size. There is no left ventricular hypertrophy. Left ventricular diastolic parameters are indeterminate. Elevated left ventricular end-diastolic pressure. Right Ventricle: The right ventricular size is normal. No increase in right ventricular wall thickness. Right ventricular systolic function is normal. Tricuspid regurgitation signal is inadequate for assessing PA pressure. Left Atrium: Left atrial size was mildly dilated. Right Atrium: Right atrial size was normal in size. Pericardium: There is no evidence of pericardial effusion. Mitral Valve: The mitral valve is grossly normal. Trivial mitral valve regurgitation. No evidence of mitral valve stenosis. Tricuspid Valve: The tricuspid valve is not well visualized. Tricuspid valve regurgitation is not demonstrated. No evidence of tricuspid stenosis. Aortic Valve: The aortic valve was not well visualized. Aortic valve regurgitation is mild. Aortic regurgitation PHT measures 585 msec. No aortic stenosis is present. Aortic valve mean gradient measures 5.0 mmHg. Aortic valve peak gradient measures 11.4 mmHg. Aortic valve area, by VTI  measures 2.25 cm. Pulmonic Valve: The pulmonic valve was not well visualized. Pulmonic valve regurgitation is not visualized. No evidence of pulmonic stenosis. Aorta: The aortic root is normal in size and structure. Venous: The inferior vena cava is dilated in size with less than 50% respiratory variability, suggesting right atrial pressure of 15 mmHg. IAS/Shunts:  No atrial level shunt detected by color flow Doppler.  LEFT VENTRICLE PLAX 2D LVIDd:         5.00 cm   Diastology LVIDs:         3.50 cm   LV e' medial:    9.57 cm/s LV PW:         0.90 cm   LV E/e' medial:  12.5 LV IVS:        0.70 cm   LV e' lateral:   8.27 cm/s LVOT diam:     1.90 cm   LV E/e' lateral: 14.5 LV SV:         57 LV SV Index:   32 LVOT Area:     2.84 cm  LEFT ATRIUM             Index LA Vol (A2C):   55.2 ml 30.86 ml/m LA Vol (A4C):   62.9 ml 35.17 ml/m LA Biplane Vol: 62.8 ml 35.11 ml/m  AORTIC VALVE AV Area (Vmax):    1.73 cm AV Area (Vmean):   2.06 cm AV Area (VTI):     2.25 cm AV Vmax:           169.00 cm/s AV Vmean:          107.000 cm/s AV VTI:            0.252 m AV Peak Grad:      11.4 mmHg AV Mean Grad:      5.0 mmHg LVOT Vmax:         103.00 cm/s LVOT Vmean:        77.600 cm/s LVOT VTI:          0.200 m LVOT/AV VTI ratio: 0.79 AI PHT:            585 msec MITRAL VALVE MV Area (PHT): 4.10 cm     SHUNTS MV Decel Time: 185 msec     Systemic VTI:  0.20 m MV E velocity: 120.00 cm/s  Systemic Diam: 1.90 cm MV A velocity: 99.00 cm/s MV E/A ratio:  1.21 Akhilesh Sassone Priya Consuella Scurlock Electronically signed by Winfield Rast Maryiah Olvey Signature Date/Time: 02/17/2023/2:40:51 PM    Final    CT Angio Chest PE W and/or Wo Contrast  Result Date: 02/16/2023 CLINICAL DATA:  Headache and shortness of breath EXAM: CT ANGIOGRAPHY CHEST WITH CONTRAST TECHNIQUE: Multidetector CT imaging of the chest was performed using the standard protocol during bolus administration of intravenous contrast. Multiplanar CT image reconstructions and MIPs were obtained  to evaluate the vascular anatomy. RADIATION DOSE REDUCTION: This exam was performed according to the departmental dose-optimization program which includes automated exposure control, adjustment of the mA and/or kV according to patient size and/or use of iterative reconstruction technique. CONTRAST:  75mL OMNIPAQUE IOHEXOL 350 MG/ML SOLN COMPARISON:  None Available. FINDINGS: Cardiovascular: No evidence of pulmonary embolus. Normal heart size. No No pericardial effusion. Normal caliber thoracic aorta with moderate atherosclerotic disease. Mild coronary artery calcifications. Mediastinum/Nodes: Esophagus and thyroid are unremarkable. Mildly enlarged mediastinal and bilateral hilar lymph nodes, likely reactive. Reference subcarinal lymph node measuring 11 mm on series 5, image 41. Lungs/Pleura: Central airways are patent. Bronchial wall and septal thickening. Small bilateral pleural effusions. Solid nodules of the right middle lobe measuring 14 x 8 mm on series 7 image 80 and 6 mm on image 70. Upper Abdomen: No acute abnormality. Musculoskeletal: Partially visualized vascular stent coursing through the soft tissues of the right chest wall. No acute or significant osseous findings.  Review of the MIP images confirms the above findings. IMPRESSION: 1. No evidence of pulmonary embolus. 2. Small bilateral pleural effusions with bronchial wall and septal thickening, likely due to pulmonary edema. 3. Solid nodules of the right middle lobe, largest measuring up to 14 mm. Recommend follow-up chest CT in 3 months. This recommendation follows the consensus statement: Guidelines for Management of Incidental Pulmonary Nodules Detected on CT Images: From the Fleischner Society 2017; Radiology 2017; 284:228-243. 4. Mildly enlarged mediastinal and bilateral hilar lymph nodes, likely reactive. 5. Aortic Atherosclerosis (ICD10-I70.0). Electronically Signed   By: Allegra Lai M.D.   On: 02/16/2023 18:47   CT Head Wo  Contrast  Result Date: 02/16/2023 CLINICAL DATA:  Headache, increasing frequency or severity EXAM: CT HEAD WITHOUT CONTRAST TECHNIQUE: Contiguous axial images were obtained from the base of the skull through the vertex without intravenous contrast. RADIATION DOSE REDUCTION: This exam was performed according to the departmental dose-optimization program which includes automated exposure control, adjustment of the mA and/or kV according to patient size and/or use of iterative reconstruction technique. COMPARISON:  CT Head 02/01/23 FINDINGS: Brain: Negative for an acute infarct. No hemorrhage. No hydrocephalus. No extra-axial fluid collection. There is sequela of moderate chronic microvascular ischemic change. Subcortical hypodensity in the right frontal lobe either represents a chronic infarct or changes related to chronic microvascular ischemic change. 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 No mass effect. No mass lesion. Vascular: No hyperdense vessel or unexpected calcification. Skull: Normal. Negative for fracture or focal lesion. Sinuses/Orbits: No middle ear or mastoid effusion. Paranasal sinuses are clear. Orbits are unremarkable. Other: None. IMPRESSION: No acute intracranial process. Electronically Signed   By: Lorenza Cambridge M.D.   On: 02/16/2023 18:20   DG Chest 2 View  Result Date: 02/16/2023 CLINICAL DATA:  Shortness of breath for 3 weeks EXAM: CHEST - 2 VIEW COMPARISON:  12/12/2015 FINDINGS: Bilateral mild interstitial thickening. No focal consolidation. No pleural effusion or pneumothorax. Heart and mediastinal contours are unremarkable. No acute osseous abnormality. IMPRESSION: 1. Bilateral mild interstitial thickening which may reflect mild interstitial edema versus interstitial infection. Electronically Signed   By: Elige Ko M.D.   On: 02/16/2023 15:10     Assessment & Plan   Hypertensive emergency PAD s/p multiple interventions Mild nonobstructive  CAD per CTA in 2023 (difficult to reassess LCx)   -Continue nicardipine drip and wean off in the afternoon after starting oral antihypertensives, increase carvedilol from 12.5 mg to 25 mg twice daily, start losartan 50 mg once daily, switch IV Lasix to p.o. Lasix 40 mg once daily (starting tomorrow). She reported receiving steroids a few weeks ago after which her blood pressure stayed in 200s. Recommend cautious use of steroids in the future. -No angina ever, troponin elevation likely secondary to demand ischemia from hypertensive emergency. Echo from admission showed normal LVEF, normal RV function, mild aortic regurgitation. -Continue aspirin 81 mg once daily, rosuvastatin 10 mg nightly.   Signed, Marjo Bicker, MD  02/18/2023, 10:24 AM

## 2023-02-18 NOTE — Plan of Care (Signed)

## 2023-02-19 DIAGNOSIS — I161 Hypertensive emergency: Secondary | ICD-10-CM | POA: Diagnosis not present

## 2023-02-19 MED ORDER — LOSARTAN POTASSIUM 50 MG PO TABS: 50.0000 mg | ORAL_TABLET | Freq: Every day | ORAL | 2 refills | Status: DC

## 2023-02-19 MED ORDER — FUROSEMIDE 40 MG PO TABS: 40.0000 mg | ORAL_TABLET | Freq: Every day | ORAL | 2 refills | Status: DC

## 2023-02-19 MED ORDER — CARVEDILOL 12.5 MG PO TABS: 12.5000 mg | ORAL_TABLET | Freq: Two times a day (BID) | ORAL | 2 refills | Status: DC

## 2023-02-19 NOTE — TOC Transition Note (Signed)
Transition of Care [facility]) - CM/SW Discharge Note   Patient Details  Name: Cathy Jones MRN: [id number] Date of Birth: 1961/04/15  [facility] CM/SW Contact:  Leitha Bleak, RN Phone Number: 02/19/2023, 12:26 PM   Clinical Narrative:   Patient discharging home to  follow up with cardiology, Appt on AVS.   Final next level of care: Home/Self Care Barriers to Discharge: No Home Care Agency will accept this patient         Discharge Plan and Services Additional resources added to the After Visit Summary   Discharge Planning Services: CM Consult                Social Determinants of Health (SDOH) Interventions SDOH Screenings   Food Insecurity: No Food Insecurity (02/17/2023)  Housing: Low Risk  (02/17/2023)  Transportation Needs: No Transportation Needs (02/17/2023)  Utilities: Not At Risk (02/17/2023)  Financial Resource Strain: Unknown (06/07/2018)  Physical Activity: Unknown (06/07/2018)  Social Connections: Unknown (06/07/2018)  Stress: Unknown (06/07/2018)  Tobacco Use: High Risk (02/16/2023)     Readmission Risk Interventions    08/29/2021   10:53 AM  Readmission Risk Prevention Plan  Post Dischage Appt Complete  Medication Screening Complete  Transportation Screening Complete

## 2023-02-19 NOTE — Discharge Summary (Signed)
known as: ADVIL   predniSONE 10 MG (21) Tbpk tablet Commonly known as: STERAPRED UNI-PAK 21 TAB   predniSONE 5 MG (21) Tbpk tablet Commonly known as: STERAPRED UNI-PAK 21 TAB   PROBIOTIC PO       TAKE these medications    albuterol 108 (90 Base) MCG/ACT inhaler Commonly known as: VENTOLIN HFA Inhale 2 puffs into the lungs every 4 (four) hours as  needed.   aspirin EC 81 MG tablet Take 81 mg by mouth daily. Swallow whole.   carvedilol 12.5 MG tablet Commonly known as: COREG Take 1 tablet (12.5 mg total) by mouth 2 (two) times daily with a meal. What changed:  medication strength See the new instructions.   ezetimibe 10 MG tablet Commonly known as: ZETIA Take 1 tablet (10 mg total) by mouth daily.   furosemide 40 MG tablet Commonly known as: LASIX Take 1 tablet (40 mg total) by mouth daily. Start taking on: February 20, 2023   losartan 50 MG tablet Commonly known as: COZAAR Take 1 tablet (50 mg total) by mouth daily. Start taking on: February 20, 2023   rosuvastatin 10 MG tablet Commonly known as: CRESTOR Take 1 tablet (10 mg total) by mouth daily.   Vitamin D (Ergocalciferol) 1.25 MG (50000 UNIT) Caps capsule Commonly known as: DRISDOL Take 50,000 Units by mouth every Saturday.        Discharge Exam: Filed Weights   02/17/23 1100 02/18/23 0500 02/19/23 0500  Weight: 80.6 kg 79 kg 78.9 kg        General:  AAO x 3,  cooperative, no distress;   HEENT:  Normocephalic, PERRL, otherwise with in Normal limits   Neuro:  CNII-XII intact. , normal motor and sensation, reflexes intact   Lungs:   Clear to auscultation BL, Respirations unlabored,  No wheezes / crackles  Cardio:    S1/S2, RRR, No murmure, No Rubs or Gallops   Abdomen:  Soft, non-tender, bowel sounds active all four quadrants, no guarding or peritoneal signs.  Muscular  skeletal:  Limited exam -global generalized weaknesses - in bed, able to move all 4 extremities,   2+ pulses,  symmetric, No pitting edema  Skin:  Dry, warm to touch, negative for any Rashes,  Wounds: Please see nursing documentation          Condition at discharge: good  The results of significant diagnostics from this hospitalization (including imaging, microbiology, ancillary and laboratory) are listed below for reference.   Imaging Studies: ECHOCARDIOGRAM  COMPLETE  Result Date: 02/17/2023    ECHOCARDIOGRAM REPORT   Patient Name:   Cathy Jones Date of Exam: 02/17/2023 Medical Rec #:  [id number]       Height:       62.0 in Accession #:    [id number]      Weight:       171.0 lb Date of Birth:  1961-03-19        BSA:          1.789 m Patient Age:    62 years        BP:           172/65 mmHg Patient Gender: F               HR:           100 bpm. Exam Location:  [facility] Procedure: 2D Echo, Cardiac Doppler, Color Doppler and Intracardiac            Opacification Agent  Physician Discharge Summary   Patient: Cathy Jones MRN: [id number] DOB: May 07, 1961  Admit date:     02/16/2023  Discharge date: 02/19/23  Discharge Physician: Kendell Bane   PCP: Benita Stabile, MD   Recommendations at discharge:   Follow-up with a cardiologist in 2-4 weeks Follow-up with the PCP in 1-2 weeks New medications : Lasix 40 mg once daily, carvedilol 12.5 mg twice daily and losartan 50 mg once daily. Continue aspirin and statins  Discharge Diagnoses: Principal Problem:   Hypertensive emergency Active Problems:   Essential hypertension   Mixed hyperlipidemia   Acute pulmonary edema (HCC)   Elevated brain natriuretic peptide (BNP) level   Elevated troponin   Headache   Lung nodules  Cathy Jones is a 62 y.o. female with medical history significant of hypertension, hyperlipidemia who presents to the emergency department due to 2 to 3-week onset of shortness of breath associated with cough, patient was recently diagnosed with bronchitis and was treated with steroids, symptoms momentarily improved, then worsened within the last few days.  This morning, she had increased shortness of breath and elevated blood pressure which was associated with headache treated and patient was taken to the ED for further evaluation and management.  She has been admitted with acute CHF exacerbation in the setting of hypertensive emergency with elevated troponin levels.  She was initially started on nitroglycerin drip and this has been transition to Cardene and cardiology following with 2D echocardiogram pending.      Hypertensive emergency Essential hypertension Continue now on IV Cardene drip--try to wean off, overnight blood pressure spiked again, nicardipine had to be retitrated Consider transitioning patient to oral meds when BP becomes better controlled -Per cardiology increasing Carvedilol from 12.5 to 25 mg twice a day, losartan 50 mg once a day Switching IV Lasix 40 mg to p.o.    Acute CHF exacerbation Continue IV Lasix 40 mg twice daily >>> Per cardiology switching to p.o. Lasix in a.m. 2D echocardiogram reviewed normal EF, normal LV function, mild aortic regurgitation Continue aspirin and rosuvastatin, and added Lasix and losartan   Elevated troponin possibly secondary to type II demand ischemia Denies any chest pain no overt EKG changes Troponin is flattened-  221 > 354 > 225 > 187.   Appreciate ongoing cardiology : Recommending continuing aspirin, statins, increasing carvedilol   Headache -Resolved This may be due to patient's elevated BP and/or nitroglycerin Continue Tylenol as needed   Incidental finding of lung nodules CT of your chest showed solid nodules of the right middle lobe, largest measuring up to 14 mm. Chest CT in 3 months was recommended per radiologist   Mixed hyperlipidemia Continue Crestor, Zetia    Leukocytosis-improving Likely related to recent steroid use, continue to monitor   Obesity BMI 31.28     Consultants: Cardiologist Procedures performed: 2D echocardiogram Disposition: Home Diet recommendation:  Discharge Diet Orders (From admission, onward)     Start     Ordered   02/19/23 0000  Diet - low sodium heart healthy        02/19/23 1020           Cardiac diet DISCHARGE MEDICATION: Allergies as of 02/19/2023       Reactions   Codeine Other (See Comments), Hypertension   "hyper"        Medication List     STOP taking these medications    amoxicillin-clavulanate 875-125 MG tablet Commonly known as: AUGMENTIN   ibuprofen 200 MG tablet Commonly  Physician Discharge Summary   Patient: Cathy Jones MRN: [id number] DOB: May 07, 1961  Admit date:     02/16/2023  Discharge date: 02/19/23  Discharge Physician: Kendell Bane   PCP: Benita Stabile, MD   Recommendations at discharge:   Follow-up with a cardiologist in 2-4 weeks Follow-up with the PCP in 1-2 weeks New medications : Lasix 40 mg once daily, carvedilol 12.5 mg twice daily and losartan 50 mg once daily. Continue aspirin and statins  Discharge Diagnoses: Principal Problem:   Hypertensive emergency Active Problems:   Essential hypertension   Mixed hyperlipidemia   Acute pulmonary edema (HCC)   Elevated brain natriuretic peptide (BNP) level   Elevated troponin   Headache   Lung nodules  Cathy Jones is a 62 y.o. female with medical history significant of hypertension, hyperlipidemia who presents to the emergency department due to 2 to 3-week onset of shortness of breath associated with cough, patient was recently diagnosed with bronchitis and was treated with steroids, symptoms momentarily improved, then worsened within the last few days.  This morning, she had increased shortness of breath and elevated blood pressure which was associated with headache treated and patient was taken to the ED for further evaluation and management.  She has been admitted with acute CHF exacerbation in the setting of hypertensive emergency with elevated troponin levels.  She was initially started on nitroglycerin drip and this has been transition to Cardene and cardiology following with 2D echocardiogram pending.      Hypertensive emergency Essential hypertension Continue now on IV Cardene drip--try to wean off, overnight blood pressure spiked again, nicardipine had to be retitrated Consider transitioning patient to oral meds when BP becomes better controlled -Per cardiology increasing Carvedilol from 12.5 to 25 mg twice a day, losartan 50 mg once a day Switching IV Lasix 40 mg to p.o.    Acute CHF exacerbation Continue IV Lasix 40 mg twice daily >>> Per cardiology switching to p.o. Lasix in a.m. 2D echocardiogram reviewed normal EF, normal LV function, mild aortic regurgitation Continue aspirin and rosuvastatin, and added Lasix and losartan   Elevated troponin possibly secondary to type II demand ischemia Denies any chest pain no overt EKG changes Troponin is flattened-  221 > 354 > 225 > 187.   Appreciate ongoing cardiology : Recommending continuing aspirin, statins, increasing carvedilol   Headache -Resolved This may be due to patient's elevated BP and/or nitroglycerin Continue Tylenol as needed   Incidental finding of lung nodules CT of your chest showed solid nodules of the right middle lobe, largest measuring up to 14 mm. Chest CT in 3 months was recommended per radiologist   Mixed hyperlipidemia Continue Crestor, Zetia    Leukocytosis-improving Likely related to recent steroid use, continue to monitor   Obesity BMI 31.28     Consultants: Cardiologist Procedures performed: 2D echocardiogram Disposition: Home Diet recommendation:  Discharge Diet Orders (From admission, onward)     Start     Ordered   02/19/23 0000  Diet - low sodium heart healthy        02/19/23 1020           Cardiac diet DISCHARGE MEDICATION: Allergies as of 02/19/2023       Reactions   Codeine Other (See Comments), Hypertension   "hyper"        Medication List     STOP taking these medications    amoxicillin-clavulanate 875-125 MG tablet Commonly known as: AUGMENTIN   ibuprofen 200 MG tablet Commonly  known as: ADVIL   predniSONE 10 MG (21) Tbpk tablet Commonly known as: STERAPRED UNI-PAK 21 TAB   predniSONE 5 MG (21) Tbpk tablet Commonly known as: STERAPRED UNI-PAK 21 TAB   PROBIOTIC PO       TAKE these medications    albuterol 108 (90 Base) MCG/ACT inhaler Commonly known as: VENTOLIN HFA Inhale 2 puffs into the lungs every 4 (four) hours as  needed.   aspirin EC 81 MG tablet Take 81 mg by mouth daily. Swallow whole.   carvedilol 12.5 MG tablet Commonly known as: COREG Take 1 tablet (12.5 mg total) by mouth 2 (two) times daily with a meal. What changed:  medication strength See the new instructions.   ezetimibe 10 MG tablet Commonly known as: ZETIA Take 1 tablet (10 mg total) by mouth daily.   furosemide 40 MG tablet Commonly known as: LASIX Take 1 tablet (40 mg total) by mouth daily. Start taking on: February 20, 2023   losartan 50 MG tablet Commonly known as: COZAAR Take 1 tablet (50 mg total) by mouth daily. Start taking on: February 20, 2023   rosuvastatin 10 MG tablet Commonly known as: CRESTOR Take 1 tablet (10 mg total) by mouth daily.   Vitamin D (Ergocalciferol) 1.25 MG (50000 UNIT) Caps capsule Commonly known as: DRISDOL Take 50,000 Units by mouth every Saturday.        Discharge Exam: Filed Weights   02/17/23 1100 02/18/23 0500 02/19/23 0500  Weight: 80.6 kg 79 kg 78.9 kg        General:  AAO x 3,  cooperative, no distress;   HEENT:  Normocephalic, PERRL, otherwise with in Normal limits   Neuro:  CNII-XII intact. , normal motor and sensation, reflexes intact   Lungs:   Clear to auscultation BL, Respirations unlabored,  No wheezes / crackles  Cardio:    S1/S2, RRR, No murmure, No Rubs or Gallops   Abdomen:  Soft, non-tender, bowel sounds active all four quadrants, no guarding or peritoneal signs.  Muscular  skeletal:  Limited exam -global generalized weaknesses - in bed, able to move all 4 extremities,   2+ pulses,  symmetric, No pitting edema  Skin:  Dry, warm to touch, negative for any Rashes,  Wounds: Please see nursing documentation          Condition at discharge: good  The results of significant diagnostics from this hospitalization (including imaging, microbiology, ancillary and laboratory) are listed below for reference.   Imaging Studies: ECHOCARDIOGRAM  COMPLETE  Result Date: 02/17/2023    ECHOCARDIOGRAM REPORT   Patient Name:   Cathy Jones Date of Exam: 02/17/2023 Medical Rec #:  [id number]       Height:       62.0 in Accession #:    [id number]      Weight:       171.0 lb Date of Birth:  1961-03-19        BSA:          1.789 m Patient Age:    62 years        BP:           172/65 mmHg Patient Gender: F               HR:           100 bpm. Exam Location[facility] Procedure: 2D Echo, Cardiac Doppler, Color Doppler and Intracardiac            Opacification Agent  Physician Discharge Summary   Patient: Cathy Jones MRN: [id number] DOB: May 07, 1961  Admit date:     02/16/2023  Discharge date: 02/19/23  Discharge Physician: Kendell Bane   PCP: Benita Stabile, MD   Recommendations at discharge:   Follow-up with a cardiologist in 2-4 weeks Follow-up with the PCP in 1-2 weeks New medications : Lasix 40 mg once daily, carvedilol 12.5 mg twice daily and losartan 50 mg once daily. Continue aspirin and statins  Discharge Diagnoses: Principal Problem:   Hypertensive emergency Active Problems:   Essential hypertension   Mixed hyperlipidemia   Acute pulmonary edema (HCC)   Elevated brain natriuretic peptide (BNP) level   Elevated troponin   Headache   Lung nodules  Cathy Jones is a 62 y.o. female with medical history significant of hypertension, hyperlipidemia who presents to the emergency department due to 2 to 3-week onset of shortness of breath associated with cough, patient was recently diagnosed with bronchitis and was treated with steroids, symptoms momentarily improved, then worsened within the last few days.  This morning, she had increased shortness of breath and elevated blood pressure which was associated with headache treated and patient was taken to the ED for further evaluation and management.  She has been admitted with acute CHF exacerbation in the setting of hypertensive emergency with elevated troponin levels.  She was initially started on nitroglycerin drip and this has been transition to Cardene and cardiology following with 2D echocardiogram pending.      Hypertensive emergency Essential hypertension Continue now on IV Cardene drip--try to wean off, overnight blood pressure spiked again, nicardipine had to be retitrated Consider transitioning patient to oral meds when BP becomes better controlled -Per cardiology increasing Carvedilol from 12.5 to 25 mg twice a day, losartan 50 mg once a day Switching IV Lasix 40 mg to p.o.    Acute CHF exacerbation Continue IV Lasix 40 mg twice daily >>> Per cardiology switching to p.o. Lasix in a.m. 2D echocardiogram reviewed normal EF, normal LV function, mild aortic regurgitation Continue aspirin and rosuvastatin, and added Lasix and losartan   Elevated troponin possibly secondary to type II demand ischemia Denies any chest pain no overt EKG changes Troponin is flattened-  221 > 354 > 225 > 187.   Appreciate ongoing cardiology : Recommending continuing aspirin, statins, increasing carvedilol   Headache -Resolved This may be due to patient's elevated BP and/or nitroglycerin Continue Tylenol as needed   Incidental finding of lung nodules CT of your chest showed solid nodules of the right middle lobe, largest measuring up to 14 mm. Chest CT in 3 months was recommended per radiologist   Mixed hyperlipidemia Continue Crestor, Zetia    Leukocytosis-improving Likely related to recent steroid use, continue to monitor   Obesity BMI 31.28     Consultants: Cardiologist Procedures performed: 2D echocardiogram Disposition: Home Diet recommendation:  Discharge Diet Orders (From admission, onward)     Start     Ordered   02/19/23 0000  Diet - low sodium heart healthy        02/19/23 1020           Cardiac diet DISCHARGE MEDICATION: Allergies as of 02/19/2023       Reactions   Codeine Other (See Comments), Hypertension   "hyper"        Medication List     STOP taking these medications    amoxicillin-clavulanate 875-125 MG tablet Commonly known as: AUGMENTIN   ibuprofen 200 MG tablet Commonly  Physician Discharge Summary   Patient: Cathy Jones MRN: [id number] DOB: May 07, 1961  Admit date:     02/16/2023  Discharge date: 02/19/23  Discharge Physician: Kendell Bane   PCP: Benita Stabile, MD   Recommendations at discharge:   Follow-up with a cardiologist in 2-4 weeks Follow-up with the PCP in 1-2 weeks New medications : Lasix 40 mg once daily, carvedilol 12.5 mg twice daily and losartan 50 mg once daily. Continue aspirin and statins  Discharge Diagnoses: Principal Problem:   Hypertensive emergency Active Problems:   Essential hypertension   Mixed hyperlipidemia   Acute pulmonary edema (HCC)   Elevated brain natriuretic peptide (BNP) level   Elevated troponin   Headache   Lung nodules  Cathy Jones is a 62 y.o. female with medical history significant of hypertension, hyperlipidemia who presents to the emergency department due to 2 to 3-week onset of shortness of breath associated with cough, patient was recently diagnosed with bronchitis and was treated with steroids, symptoms momentarily improved, then worsened within the last few days.  This morning, she had increased shortness of breath and elevated blood pressure which was associated with headache treated and patient was taken to the ED for further evaluation and management.  She has been admitted with acute CHF exacerbation in the setting of hypertensive emergency with elevated troponin levels.  She was initially started on nitroglycerin drip and this has been transition to Cardene and cardiology following with 2D echocardiogram pending.      Hypertensive emergency Essential hypertension Continue now on IV Cardene drip--try to wean off, overnight blood pressure spiked again, nicardipine had to be retitrated Consider transitioning patient to oral meds when BP becomes better controlled -Per cardiology increasing Carvedilol from 12.5 to 25 mg twice a day, losartan 50 mg once a day Switching IV Lasix 40 mg to p.o.    Acute CHF exacerbation Continue IV Lasix 40 mg twice daily >>> Per cardiology switching to p.o. Lasix in a.m. 2D echocardiogram reviewed normal EF, normal LV function, mild aortic regurgitation Continue aspirin and rosuvastatin, and added Lasix and losartan   Elevated troponin possibly secondary to type II demand ischemia Denies any chest pain no overt EKG changes Troponin is flattened-  221 > 354 > 225 > 187.   Appreciate ongoing cardiology : Recommending continuing aspirin, statins, increasing carvedilol   Headache -Resolved This may be due to patient's elevated BP and/or nitroglycerin Continue Tylenol as needed   Incidental finding of lung nodules CT of your chest showed solid nodules of the right middle lobe, largest measuring up to 14 mm. Chest CT in 3 months was recommended per radiologist   Mixed hyperlipidemia Continue Crestor, Zetia    Leukocytosis-improving Likely related to recent steroid use, continue to monitor   Obesity BMI 31.28     Consultants: Cardiologist Procedures performed: 2D echocardiogram Disposition: Home Diet recommendation:  Discharge Diet Orders (From admission, onward)     Start     Ordered   02/19/23 0000  Diet - low sodium heart healthy        02/19/23 1020           Cardiac diet DISCHARGE MEDICATION: Allergies as of 02/19/2023       Reactions   Codeine Other (See Comments), Hypertension   "hyper"        Medication List     STOP taking these medications    amoxicillin-clavulanate 875-125 MG tablet Commonly known as: AUGMENTIN   ibuprofen 200 MG tablet Commonly

## 2023-02-19 NOTE — Progress Notes (Signed)
Progress Note  Patient Name: Cathy Jones Date of Encounter: 02/19/2023  Primary Cardiologist: Thurmon Fair, MD  Subjective   No acute events overnight, no symptoms.  Off nicardipine drip yesterday afternoon.  Inpatient Medications    Scheduled Meds:  aspirin EC  81 mg Oral Daily   carvedilol  12.5 mg Oral BID WC   Chlorhexidine Gluconate Cloth  6 each Topical Q0600   enoxaparin (LOVENOX) injection  40 mg Subcutaneous Q24H   ezetimibe  10 mg Oral Daily   furosemide  40 mg Oral Daily   losartan  50 mg Oral Daily   rosuvastatin  10 mg Oral Daily   Continuous Infusions:  niCARDipine Stopped (02/18/23 1340)   PRN Meds: acetaminophen **OR** acetaminophen, butalbital-acetaminophen-caffeine, hydrALAZINE, ondansetron **OR** ondansetron (ZOFRAN) IV   Vital Signs    Vitals:   02/19/23 0723 02/19/23 0730 02/19/23 0800 02/19/23 0830  BP:  (!) 161/65 (!) 166/62 138/66  Pulse:  79 79 80  Resp:  19 17 12   Temp: 98.4 F (36.9 C)     TempSrc: Oral     SpO2:  93% 95% 97%  Weight:      Height:        Intake/Output Summary (Last 24 hours) at 02/19/2023 0955 Last data filed at 02/18/2023 1714 Gross per 24 hour  Intake 503.6 ml  Output 500 ml  Net 3.6 ml   Filed Weights   02/17/23 1100 02/18/23 0500 02/19/23 0500  Weight: 80.6 kg 79 kg 78.9 kg    Telemetry     Personally reviewed, NSR.  ECG   Not performed today  Physical Exam   GEN: No acute distress.   Neck: No JVD. Cardiac: RRR, no murmur, rub, or gallop.  Respiratory: Nonlabored. Clear to auscultation bilaterally. GI: Soft, nontender, bowel sounds present. MS: No edema; No deformity. Neuro:  Nonfocal. Psych: Alert and oriented x 3. Normal affect.  Labs    Chemistry Recent Labs  Lab 02/16/23 1330 02/17/23 0238 02/18/23 0444  NA 133* 133* 133*  K 3.8 4.2 4.0  CL 97* 99 95*  CO2 26 25 28   GLUCOSE 124* 123* 103*  BUN 19 21 20   CREATININE 1.07* 1.07* 1.22*  CALCIUM 8.6* 8.5* 8.4*  PROT  --   5.7*  --   ALBUMIN  --  3.1*  --   AST  --  10*  --   ALT  --  10  --   ALKPHOS  --  85  --   BILITOT  --  1.2  --   GFRNONAA 59* 59* 50*  ANIONGAP 10 9 10      Hematology Recent Labs  Lab 02/16/23 1330 02/17/23 0238 02/18/23 0444  WBC 29.3* 20.5* 15.1*  RBC 5.85* 4.86 5.18*  HGB 15.4* 13.0 13.7  HCT 49.5* 40.2 43.6  MCV 84.6 82.7 84.2  MCH 26.3 26.7 26.4  MCHC 31.1 32.3 31.4  RDW 13.8 14.2 14.1  PLT 327 230 229    Cardiac Enzymes Recent Labs  Lab 02/16/23 1330 02/16/23 1504 02/16/23 1721 02/16/23 1946  TROPONINIHS 221* 354* 225* 187*    BNP Recent Labs  Lab 02/16/23 1330 02/18/23 0444  BNP 967.0* 317.0*     DDimerNo results for input(s): "DDIMER" in the last 168 hours.   Radiology    ECHOCARDIOGRAM COMPLETE  Result Date: 02/17/2023    ECHOCARDIOGRAM REPORT   Patient Name:   Cathy Jones Date of Exam: 02/17/2023 Medical Rec #:  [id number]  Height:       62.0 in Accession #:    [id number]      Weight:       171.0 lb Date of Birth:  [birth year]        BSA:          1.789 m Patient Age:    62 years        BP:           172/65 mmHg Patient Gender: F               HR:           100 bpm. [facility] Procedure: 2D Echo, Cardiac Doppler, Color Doppler and Intracardiac            Opacification Agent Indications:    CHF-Acute Diastolic I50.31  History:        Patient has no prior history of Echocardiogram examinations.                 Risk Factors:Hypertension.  Sonographer:    Darlys Gales Referring Phys: [id number] OLADAPO ADEFESO IMPRESSIONS  1. Left ventricular ejection fraction, by estimation, is 60 to 65%. The left ventricle has normal function. The left ventricle has no regional wall motion abnormalities. Left ventricular diastolic parameters are indeterminate. Elevated left ventricular end-diastolic pressure.  2. Right ventricular systolic function is normal. The right ventricular size is normal. Tricuspid regurgitation signal is inadequate for  assessing PA pressure.  3. Left atrial size was mildly dilated.  4. The mitral valve is grossly normal. Trivial mitral valve regurgitation. No evidence of mitral stenosis.  5. The aortic valve was not well visualized. Aortic valve regurgitation is mild. No aortic stenosis is present.  6. The inferior vena cava is dilated in size with <50% respiratory variability, suggesting right atrial pressure of 15 mmHg. Comparison(s): No prior Echocardiogram. FINDINGS  Left Ventricle: Left ventricular ejection fraction, by estimation, is 60 to 65%. The left ventricle has normal function. The left ventricle has no regional wall motion abnormalities. Definity contrast agent was given IV to delineate the left ventricular  endocardial borders. The left ventricular internal cavity size was normal in size. There is no left ventricular hypertrophy. Left ventricular diastolic parameters are indeterminate. Elevated left ventricular end-diastolic pressure. Right Ventricle: The right ventricular size is normal. No increase in right ventricular wall thickness. Right ventricular systolic function is normal. Tricuspid regurgitation signal is inadequate for assessing PA pressure. Left Atrium: Left atrial size was mildly dilated. Right Atrium: Right atrial size was normal in size. Pericardium: There is no evidence of pericardial effusion. Mitral Valve: The mitral valve is grossly normal. Trivial mitral valve regurgitation. No evidence of mitral valve stenosis. Tricuspid Valve: The tricuspid valve is not well visualized. Tricuspid valve regurgitation is not demonstrated. No evidence of tricuspid stenosis. Aortic Valve: The aortic valve was not well visualized. Aortic valve regurgitation is mild. Aortic regurgitation PHT measures 585 msec. No aortic stenosis is present. Aortic valve mean gradient measures 5.0 mmHg. Aortic valve peak gradient measures 11.4 mmHg. Aortic valve area, by VTI measures 2.25 cm. Pulmonic Valve: The pulmonic valve was  not well visualized. Pulmonic valve regurgitation is not visualized. No evidence of pulmonic stenosis. Aorta: The aortic root is normal in size and structure. Venous: The inferior vena cava is dilated in size with less than 50% respiratory variability, suggesting right atrial pressure of 15 mmHg. IAS/Shunts: No atrial level shunt detected by color flow Doppler.  LEFT VENTRICLE PLAX  2D LVIDd:         5.00 cm   Diastology LVIDs:         3.50 cm   LV e' medial:    9.57 cm/s LV PW:         0.90 cm   LV E/e' medial:  12.5 LV IVS:        0.70 cm   LV e' lateral:   8.27 cm/s LVOT diam:     1.90 cm   LV E/e' lateral: 14.5 LV SV:         57 LV SV Index:   32 LVOT Area:     2.84 cm  LEFT ATRIUM             Index LA Vol (A2C):   55.2 ml 30.86 ml/m LA Vol (A4C):   62.9 ml 35.17 ml/m LA Biplane Vol: 62.8 ml 35.11 ml/m  AORTIC VALVE AV Area (Vmax):    1.73 cm AV Area (Vmean):   2.06 cm AV Area (VTI):     2.25 cm AV Vmax:           169.00 cm/s AV Vmean:          107.000 cm/s AV VTI:            0.252 m AV Peak Grad:      11.4 mmHg AV Mean Grad:      5.0 mmHg LVOT Vmax:         103.00 cm/s LVOT Vmean:        77.600 cm/s LVOT VTI:          0.200 m LVOT/AV VTI ratio: 0.79 AI PHT:            585 msec MITRAL VALVE MV Area (PHT): 4.10 cm     SHUNTS MV Decel Time: 185 msec     Systemic VTI:  0.20 m MV E velocity: 120.00 cm/s  Systemic Diam: 1.90 cm MV A velocity: 99.00 cm/s MV E/A ratio:  1.21 Kyuss Hale Priya Susan Arana Electronically signed by Winfield Rast Flossie Wexler Signature Date/Time: 02/17/2023/2:40:51 PM    Final      Assessment & Plan   Hypertensive emergency, controlled PAD s/p multiple interventions Mild nonobstructive CAD per CTA in 2023 (difficult to reassess LCx)   -Off nicardipine drip yesterday, continue current oral antihypertensives, p.o. Lasix 40 mg once daily, carvedilol 12.5 mg twice daily and losartan 50 mg once daily.  Reported receiving steroids a few weeks ago after which her blood pressure stayed in  200s, recommend cautious use of steroids in the future. -No angina ever, troponin elevation likely secondary to demand ischemia from hypertensive urgency.  Echo from admission showed normal LVEF, normal RV function, mild aortic regurgitation. -Continue aspirin 81 mg once daily, rosuvastatin 10 mg nightly. -Outpatient cardiology follow-up.   [facility] will sign off.   Medication Recommendations: Continue above medications Other recommendations (labs, testing, etc): None Follow up as an outpatient: Cardiology outpatient follow-up in 1 month    Signed, Peni Rupard P Cressida Milford, MD  02/19/2023, 9:55 AM

## 2023-02-20 NOTE — Consult Note (Signed)
[facility] Customer service manager [facility]) Accountable Care Organization (ACO) [facility] Liaison Note  02/20/2023  Cathy Jones 1960-12-09 [id number]  Location: [facility] RN Hospital Liaison screened the patient remotely at [facility].  Insurance: [employer]   [name] is a 62 y.o. female who is a Primary Care Patient of Margo Aye, Kathleene Hazel, MD. The patient was screened for  readmission hospitalization with noted medium risk score for unplanned readmission risk with 1 IP/1 ED in 6 months.  The patient was assessed for potential [facility]) Care Management service needs for post hospital transition for care coordination. Review of patient's electronic medical record reveals patient was admitted with HTN emergency. No anticipated needs, pt discharged home with self care.  Plan: [facility] Liaison will continue to follow progress and disposition to asess for post hospital community care coordination/management needs.  Referral request for community care coordination: anticipate [facility] Transitions of Care Team follow up.   [facility] Care Management/Population Health does not replace or interfere with any arrangements made by the Inpatient Transition of Care team.   For questions contact:   Elliot Cousin, RN, [facility] Liaison [place]   Population Health Office Hours MTWF  8:00 am-6:00 pm [phone] mobile [phone] [Office toll free line] Office Hours are M-F 8:30 - 5 pm [email or web address]

## 2023-03-13 ENCOUNTER — Encounter: Payer: Self-pay | Admitting: Nurse Practitioner

## 2023-03-13 ENCOUNTER — Ambulatory Visit: Payer: BC Managed Care – PPO | Attending: Nurse Practitioner | Admitting: Cardiology

## 2023-03-13 VITALS — BP 134/78 | HR 75 | Ht 61.0 in | Wt 180.0 lb

## 2023-03-13 DIAGNOSIS — F17200 Nicotine dependence, unspecified, uncomplicated: Secondary | ICD-10-CM

## 2023-03-13 DIAGNOSIS — I10 Essential (primary) hypertension: Secondary | ICD-10-CM | POA: Diagnosis not present

## 2023-03-13 DIAGNOSIS — I2510 Atherosclerotic heart disease of native coronary artery without angina pectoris: Secondary | ICD-10-CM

## 2023-03-13 DIAGNOSIS — E785 Hyperlipidemia, unspecified: Secondary | ICD-10-CM | POA: Diagnosis not present

## 2023-03-13 DIAGNOSIS — Z79899 Other long term (current) drug therapy: Secondary | ICD-10-CM | POA: Diagnosis not present

## 2023-03-13 DIAGNOSIS — I739 Peripheral vascular disease, unspecified: Secondary | ICD-10-CM | POA: Diagnosis not present

## 2023-03-13 MED ORDER — LOSARTAN POTASSIUM 50 MG PO TABS: 50.0000 mg | ORAL_TABLET | Freq: Every day | ORAL | 11 refills | Status: AC

## 2023-03-13 MED ORDER — CARVEDILOL 12.5 MG PO TABS: 12.5000 mg | ORAL_TABLET | Freq: Two times a day (BID) | ORAL | 11 refills | Status: DC

## 2023-03-13 MED ORDER — FUROSEMIDE 40 MG PO TABS: 40.0000 mg | ORAL_TABLET | Freq: Every day | ORAL | 11 refills | Status: DC

## 2023-03-13 NOTE — Patient Instructions (Signed)
Medication Instructions:  Your physician recommends that you continue on your current medications as directed. Please refer to the Current Medication list given to you today.  *If you need a refill on your cardiac medications before your next appointment, please call your pharmacy*   Lab Work: BMET today   Testing/Procedures: NONE ordered at this time of appointment     Follow-Up: At [facility], you and your health needs are our priority.  As part of our continuing mission to provide you with exceptional heart care, we have created designated Provider Care Teams.  These Care Teams include your primary Cardiologist (physician) and Advanced Practice Providers (APPs -  Physician Assistants and Nurse Practitioners) who all work together to provide you with the care you need, when you need it.  We recommend signing up for the patient portal called "MyChart".  Sign up information is provided on this After Visit Summary.  MyChart is used to connect with patients for Virtual Visits (Telemedicine).  Patients are able to view lab/test results, encounter notes, upcoming appointments, etc.  Non-urgent messages can be sent to your provider as well.   To learn more about what you can do with MyChart, go to [email or web address].    Your next appointment:   Keep my next appointment   Provider:   Thurmon Fair, MD     Other Instructions Monitor Blood pressure and bring BP log to your next appointment.

## 2023-03-14 LAB — BASIC METABOLIC PANEL WITH GFR
BUN/Creatinine Ratio: 10 — ABNORMAL LOW (ref 12–28)
BUN: 14 mg/dL (ref 8–27)
CO2: 25 mmol/L (ref 20–29)
Calcium: 9.6 mg/dL (ref 8.7–10.3)
Chloride: 104 mmol/L (ref 96–106)
Creatinine, Ser: 1.41 mg/dL — ABNORMAL HIGH (ref 0.57–1.00)
Glucose: 105 mg/dL — ABNORMAL HIGH (ref 70–99)
Potassium: 4.9 mmol/L (ref 3.5–5.2)
Sodium: 141 mmol/L (ref 134–144)
eGFR: 42 mL/min/1.73 — ABNORMAL LOW

## 2023-03-18 ENCOUNTER — Telehealth: Payer: Self-pay

## 2023-03-18 DIAGNOSIS — I10 Essential (primary) hypertension: Secondary | ICD-10-CM

## 2023-03-18 NOTE — Telephone Encounter (Signed)
-----   Message from Rip Harbour sent at 03/16/2023  1:13 PM EST ----- Please let Cathy Jones know that her labs show normal electrolytes.  It does show that her kidney function is slightly reduced, would recommend we reduce her Lasix to 20 mg daily as needed for shortness of breath, lower extremity edema or weight gain of 2 to 3 pounds overnight or 5 pounds in a week.  Lets repeat a BMET in 2 weeks for monitoring.

## 2023-03-18 NOTE — Telephone Encounter (Signed)
Left message for patient to call back  

## 2023-03-19 NOTE — Telephone Encounter (Signed)
Message relayed to patient. Order placed for repeat BMET.     Please let Cathy Jones know that her labs show normal electrolytes. It does show that her kidney function is slightly reduced, would recommend we reduce her Lasix to 20 mg daily as needed for shortness of breath, lower extremity edema or weight gain of 2 to 3 pounds overnight or 5 pounds in a week. Lets repeat a BMET in 2 weeks for monitoring.

## 2023-03-19 NOTE — Telephone Encounter (Signed)
Patient returned staff call. 

## 2023-03-19 NOTE — Addendum Note (Signed)
Addended by: Kurtis Bushman on: 03/19/2023 10:14 AM   Modules accepted: Orders

## 2023-03-24 ENCOUNTER — Ambulatory Visit (INDEPENDENT_AMBULATORY_CARE_PROVIDER_SITE_OTHER): Payer: BC Managed Care – PPO | Admitting: Internal Medicine

## 2023-03-24 ENCOUNTER — Encounter: Payer: Self-pay | Admitting: Internal Medicine

## 2023-03-24 VITALS — BP 184/75 | HR 80 | Ht 61.0 in | Wt 180.0 lb

## 2023-03-24 DIAGNOSIS — F17210 Nicotine dependence, cigarettes, uncomplicated: Secondary | ICD-10-CM | POA: Insufficient documentation

## 2023-03-24 DIAGNOSIS — R911 Solitary pulmonary nodule: Secondary | ICD-10-CM

## 2023-03-24 DIAGNOSIS — R87629 Unspecified abnormal cytological findings in specimens from vagina: Secondary | ICD-10-CM | POA: Insufficient documentation

## 2023-03-24 DIAGNOSIS — R918 Other nonspecific abnormal finding of lung field: Secondary | ICD-10-CM

## 2023-03-24 DIAGNOSIS — R9389 Abnormal findings on diagnostic imaging of other specified body structures: Secondary | ICD-10-CM | POA: Insufficient documentation

## 2023-03-24 DIAGNOSIS — N952 Postmenopausal atrophic vaginitis: Secondary | ICD-10-CM | POA: Insufficient documentation

## 2023-03-24 DIAGNOSIS — J4530 Mild persistent asthma, uncomplicated: Secondary | ICD-10-CM

## 2023-03-24 NOTE — Progress Notes (Signed)
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 Continue aspirin and statins   Discharge Diagnoses: Principal Problem:   Hypertensive emergency   Essential hypertension   Mixed hyperlipidemia   Acute pulmonary edema (HCC)   Elevated brain natriuretic peptide (BNP) level   Elevated troponin   Headache   Lung nodules   Cathy Jones is a 62 y.o. female with medical history significant of hypertension, hyperlipidemia who presented  to the emergency department due to 2 to 3-week onset of shortness of breath associated with cough, patient was recently diagnosed with bronchitis and was treated with steroids, symptoms momentarily improved, then worsened within the last few days.  AM of admit  she had increased shortness of breath and elevated blood pressure which was associated with headache treated and patient was taken to the ED for further evaluation and management and admitted with acute CHF exacerbation in the setting of hypertensive emergency with elevated troponin levels.  She was initially started on nitroglycerin drip and this has been transition to Cardene and cardiology following with 2D echocardiogram pending.     Acute CHF exacerbation  2D echocardiogram reviewed normal EF, normal LV function, mild aortic  regurgitation Continue aspirin and rosuvastatin, and added Lasix and losartan   Elevated troponin possibly secondary to type II demand ischemia Denied any chest pain no overt EKG changes Troponin is flattened-  221 > 354 > 225 > 187.   Appreciate ongoing cardiology : Recommending continuing aspirin, statins, increasing carvedilol   Incidental finding of lung nodules CT of  chest showed solid nodules of the right middle lobe, largest measuring up to 14 mm. Chest CT in 3 months was recommended per radiologist         History of Present Illness  03/24/2023  Pulmonary/ 1st office eval/ Cathy Jones / [place] Office  Chief Complaint  Patient presents with   Establish Care   Lung Nodules    CT 02/16/23  Dyspnea:  Not limited by breathing from desired activities   Was  treadmill up to 7 d a week x 3 miles / day [place]  Cough: non productive but rattling  Sleep: sleeping on couch with pillow on arm  SABA use: hardly ever  02: none  Lung cancer screen: n/a  No obvious day to day or daytime pattern/variability or assoc purulent sputum or mucus plugs or hemoptysis or cp or chest tightness, subjective wheeze or overt sinus or hb symptoms.    Also denies any obvious fluctuation of symptoms with weather or environmental changes or other aggravating or alleviating factors except as outlined above   No unusual exposure hx or h/o childhood pna/ asthma or knowledge of premature birth.  Current Allergies, Complete Past Medical History, Past Surgical History, Family History, and Social History were reviewed in [employer] record.  ROS  The following are not active complaints unless bolded Hoarseness, sore throat, dysphagia, dental problems, itching, sneezing,  nasal congestion or discharge of excess mucus or purulent secretions, ear ache,   fever, chills, sweats, unintended wt loss or wt gain, classically pleuritic or exertional cp,  orthopnea pnd or arm/hand swelling  or  leg swelling, presyncope, palpitations, abdominal pain, anorexia, nausea, vomiting, diarrhea  or change in bowel habits or change in bladder habits, change in stools or change in urine, dysuria, hematuria,  rash, arthralgias, visual complaints, headache, numbness, weakness or ataxia or problems with walking or coordination,  change in mood or  memory.          Outpatient Medications Prior to Visit  Medication Sig Dispense Refill   albuterol (VENTOLIN HFA) 108 (90 Base) MCG/ACT inhaler Inhale 2 puffs into the lungs every 4 (four) hours as needed.     aspirin EC 81 MG tablet Take 81 mg by mouth daily. Swallow whole.     carvedilol (COREG) 12.5 MG tablet Take 1 tablet (12.5 mg total) by mouth 2 (two) times daily with a meal. 30 tablet 11   ezetimibe (ZETIA) 10 MG tablet Take 1 tablet (10 mg total) by mouth daily. 90 tablet 1   furosemide (LASIX) 40 MG tablet Take 1 tablet (40 mg total) by mouth daily. (Patient taking differently: Take 20 mg by mouth daily.) 30 tablet 11   losartan (COZAAR) 50 MG tablet Take 1 tablet (50 mg total) by mouth daily. 30 tablet 11   rosuvastatin (CRESTOR) 10 MG tablet Take 1 tablet (10 mg total) by mouth daily. 30 tablet 3   Vitamin D, Ergocalciferol, (DRISDOL) 1.25 MG (50000 UNIT) CAPS capsule Take 50,000 Units by mouth every Saturday.     No facility-administered medications prior to visit.    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   History of chemotherapy    History of hiatal hernia    History of radiation therapy    Hyperlipidemia    Hypertension    Iliac artery occlusion, 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   Migraine    "q couple years maybe" (08/07/2015)   Shortness of breath dyspnea    seasonal       Objective:     BP (!) 184/75   Pulse 80   Ht 5\' 1"  (1.549 m)   Wt 180 lb (81.6 kg)   SpO2 97%   BMI 34.01 kg/m   SpO2: 97 % RA  bp noted   HEENT : Oropharynx  clear   Nasal turbinates nl    NECK :  without  apparent JVD/ palpable Nodes/TM    LUNGS: no acc muscle  use,  Min barrel  contour chest wall with bilateral  slightly decreased bs s audible wheeze and  without cough on insp or exp maneuvers and min  Hyperresonant  to  percussion bilaterally    CV:  RRR  no s3 or murmur or increase in P2, and no edema   ABD:  soft and nontender with pos end  insp Hoover's  in the supine position.  No bruits or organomegaly appreciated   MS:  Nl gait/ ext warm without deformities Or obvious joint restrictions  calf tenderness, cyanosis or clubbing     SKIN: warm and dry without lesions    NEURO:  alert, approp, nl sensorium with  no motor or cerebellar deficits apparent.          I personally reviewed images and agree with radiology impression as follows:   Chest CTa    02/16/23  1. No evidence of pulmonary embolus. 2. Small bilateral pleural effusions with bronchial wall and septal thickening, likely due to pulmonary edema. 3. Solid nodules of the right middle lobe, largest measuring up to 14 mm.  Image 70 and 80 4. Mildly enlarged mediastinal and bilateral hilar lymph nodes, likely reactive. 5. Aortic Atherosclerosis (ICD10-I70.0).       08/05/21 CT cuts on coronary study  There is a triangular shaped subpleural pulmonary nodule in the lateral right middle lobe measuring approximately 4 mm on image 20. There is no evidence of pulmonary edema, consolidation, pneumothorax or pleural fluid.      Assessment   Asthmatic bronchitis Active smoker with good baseline ex tol until uri Late sept  2024 > pred/ pseudofed > admit with mild chf  02/16/23  - 03/24/2023 rec mucinex and prn saba and d/c smoking   She does not appear to have much copd at all at this point so can treat for now as Group A with just saba prn unless another flare (ie this recent was not an admit due to aecopd) occurs in different setting   Note the bilateral effusions on CT at admit c/w more of a chronic mild chf and  do mimic  aecopd because pleural effusion and copd have the same effect on insp muscles/mechanics (both shorten their length prior to inspiration making them weaker with less force reserve) so they are synergistic in causing sob.     Multiple lung nodules on CT See CT coronary 08/05/21 and CTa  02/16/23 > f/u by end of 2024   The smallest of the nodules is pleural based and seen on 08/05/21 also but the larger is more proximal and more worrisome and need close f/u by end of year with PET next option > placed for super D study in anticipation of needing navigational bx   Discussed in detail all the  indications, usual  risks and alternatives  relative to the benefits with patient who agrees to proceed with w/u as outlined.     Cigarette smoker Counseled re importance of smoking cessation but did not meet time criteria for separate billing          Each maintenance medication was reviewed in detail including emphasizing most importantly the difference between maintenance and prns and under what circumstances the prns are to be triggered using an action plan format where appropriate.  Total time for H and P, chart review, counseling, reviewing hfa  device(s) and generating customized AVS unique to this office visit / same day charting  > 60 min  Sandrea Hughs, MD 03/24/2023

## 2023-03-24 NOTE — Patient Instructions (Addendum)
For cough/ congestion > mucinex  up to maximum of  1200 mg every 12 hours and use the flutter valve as much as you can    My office will be contacting you by phone for referral for flutter valve and CT chest by end of year  - if you don't hear back from my office within one week please call us back or notify us thru MyChart and we'll address it right away   Please schedule a follow up visit in 3 months but call sooner if needed  with all medications /inhalers/ solutions/flutter valve  in hand so we can verify exactly what you are taking. This includes all medications from all doctors and over the counters

## 2023-03-24 NOTE — Assessment & Plan Note (Addendum)
Active smoker with good baseline ex tol until uri Late sept  2024 > pred/ pseudofed > admit with mild chf  02/16/23  - 03/24/2023 rec mucinex and prn saba and d/c smoking   She does not appear to have much copd at all at this point so can treat for now as Group A with just saba prn unless another flare (ie this recent was not an admit due to aecopd) occurs in different setting   Note the bilateral effusions on CT at admit c/w more of a chronic mild chf and  do mimic  aecopd because pleural effusion and copd have the same effect on insp muscles/mechanics (both shorten their length prior to inspiration making them weaker with less force reserve) so they are synergistic in causing sob.

## 2023-03-24 NOTE — Assessment & Plan Note (Addendum)
Counseled re importance of smoking cessation but did not meet time criteria for separate billing            Each maintenance medication was reviewed in detail including emphasizing most importantly the difference between maintenance and prns and under what circumstances the prns are to be triggered using an action plan format where appropriate.  Total time for H and P, chart review, counseling, reviewing hfa device(s) and generating customized AVS unique to this office visit / same day charting > 60  min with pt new to me

## 2023-03-24 NOTE — Assessment & Plan Note (Signed)
See CT coronary 08/05/21 and CTa  02/16/23 > f/u by end of 2024   The smallest of the nodules is pleural based and seen on 08/05/21 also but the larger is more proximal and more worrisome and need close f/u by end of year with PET next option > placed for super D study in anticipation of needing navigational bx   Discussed in detail all the  indications, usual  risks and alternatives  relative to the benefits with patient who agrees to proceed with w/u as outlined.

## 2023-03-30 ENCOUNTER — Telehealth: Payer: Self-pay | Admitting: Cardiovascular Disease

## 2023-03-30 MED ORDER — CARVEDILOL 12.5 MG PO TABS: 12.5000 mg | ORAL_TABLET | Freq: Two times a day (BID) | ORAL | 11 refills | Status: DC

## 2023-03-30 NOTE — Telephone Encounter (Signed)
Just a reminder that missing doses of carvedilol can cause "rebound HTN".  Most BP meds when stopped or skipped will just allow BP to return to where it would have been without the medication. Some medications, such as beta blockers (carvedilol included ), should not be abruptly stopped or skipped. That can cause rebound HTN, overshooting well over what the baseline BP would be.

## 2023-03-30 NOTE — Telephone Encounter (Signed)
West, Katlyn D, NP5 minutes ago (3:14 PM)    Thanks for checking, she should be taking carvedilol 12.5 mg twice daily, you can send her refills. Thanks!    _____________________________________________ Attempted to call Damasia no answer left message    ________________________________________________  Patient identification verified by 2 forms. Marilynn Rail, RN    Called and spoke to patient  Informed patient updated prescription for carvedilol sent to preferred pharmacy (Carvedilol 12.5mg  BID) Patient verbalized understanding, no questions at this time

## 2023-03-30 NOTE — Telephone Encounter (Signed)
Pt c/o medication issue:  1. Name of Medication: carvedilol (COREG) 12.5 MG tablet   2. How are you currently taking this medication (dosage and times per day)?   3. Are you having a reaction (difficulty breathing--STAT)?   4. What is your medication issue? Damasia, from [facility] called stating was taking 1 pill daily but was recently increased to two pills daily.  However her script was only sent to the pharmacy for 30 pills so it only lasting her 1/2 month.  If a new script can be sent for 60 pills.

## 2023-03-30 NOTE — Telephone Encounter (Signed)
Patient identification verified by 2 forms. Cathy Rail, RN    Called and spoke to patient  Patient states:   -BP have been all over the place   -this morning BP 1 hour after medication 131/75  -yesterday BP 1 hour after medication 179/84  -on 11-13, BP 180/73 had a headache    -11-09, BP 190/102 due to not having medication   -takes carvedilol consistently twice a day  -also takes Losartan daily  -NP have been all over the place   -Current BP readings are an improvement since medication change  Patient denies:   -headaches   -visual changes/disturbances -new numbness/tingling Advised patient:  -check BP in the morning before medication  -recheck BP 1 hour taking BP medications  -keep 12/13 appointment follow up with Dr. Royann Shivers  Reviewed ED warning signs/precautions  Patient verbalized understanding

## 2023-03-30 NOTE — Telephone Encounter (Signed)
Thanks for checking, she should be taking carvedilol 12.5 mg twice daily, you can send her refills. Thanks!

## 2023-03-30 NOTE — Telephone Encounter (Signed)
FWD to NP [facility] for clarification

## 2023-03-30 NOTE — Telephone Encounter (Signed)
Received call from [facility]  Informed Cathy Jones Rx updated and sent to pharmacy  Cathy Jones states:   -patient reported elevated BP readings SBP 140-190s  Informed Cathy Jones nursing will outreach patient to follow up

## 2023-03-31 NOTE — Telephone Encounter (Signed)
Noted Thank you  Patient aware to keep 12/13 follow up and reported taking Rx consistently  ED warning signs/precautions were reviewed

## 2023-04-01 DIAGNOSIS — E559 Vitamin D deficiency, unspecified: Secondary | ICD-10-CM | POA: Diagnosis not present

## 2023-04-01 DIAGNOSIS — D72829 Elevated white blood cell count, unspecified: Secondary | ICD-10-CM | POA: Diagnosis not present

## 2023-04-01 DIAGNOSIS — I739 Peripheral vascular disease, unspecified: Secondary | ICD-10-CM | POA: Diagnosis not present

## 2023-04-01 DIAGNOSIS — I6529 Occlusion and stenosis of unspecified carotid artery: Secondary | ICD-10-CM | POA: Diagnosis not present

## 2023-04-01 DIAGNOSIS — K5909 Other constipation: Secondary | ICD-10-CM | POA: Diagnosis not present

## 2023-04-01 DIAGNOSIS — E782 Mixed hyperlipidemia: Secondary | ICD-10-CM | POA: Diagnosis not present

## 2023-04-01 DIAGNOSIS — R7301 Impaired fasting glucose: Secondary | ICD-10-CM | POA: Diagnosis not present

## 2023-04-01 DIAGNOSIS — E875 Hyperkalemia: Secondary | ICD-10-CM | POA: Diagnosis not present

## 2023-04-02 ENCOUNTER — Other Ambulatory Visit: Payer: Self-pay

## 2023-04-02 DIAGNOSIS — I10 Essential (primary) hypertension: Secondary | ICD-10-CM | POA: Diagnosis not present

## 2023-04-03 ENCOUNTER — Telehealth: Payer: Self-pay

## 2023-04-03 LAB — BASIC METABOLIC PANEL
BUN/Creatinine Ratio: 11 — ABNORMAL LOW (ref 12–28)
BUN: 12 mg/dL (ref 8–27)
CO2: 24 mmol/L (ref 20–29)
Calcium: 9.8 mg/dL (ref 8.7–10.3)
Chloride: 102 mmol/L (ref 96–106)
Creatinine, Ser: 1.13 mg/dL — ABNORMAL HIGH (ref 0.57–1.00)
Glucose: 104 mg/dL — ABNORMAL HIGH (ref 70–99)
Potassium: 5 mmol/L (ref 3.5–5.2)
Sodium: 141 mmol/L (ref 134–144)
eGFR: 55 mL/min/{1.73_m2} — ABNORMAL LOW (ref >59)

## 2023-04-03 NOTE — Telephone Encounter (Signed)
-----   Message from Rip Harbour sent at 04/03/2023  9:37 AM EST ----- Please let Cathy Jones know that her kidney function has improved and is back to her baseline. Her potassium is on the high side of normal, would recommend avoiding any potassium supplements or electrolyte beverages such as liquid IV. Overall good result! Continue current medications and follow up as planned.

## 2023-04-03 NOTE — Telephone Encounter (Signed)
Called patient advised of below they verbalized understanding.

## 2023-04-08 ENCOUNTER — Other Ambulatory Visit: Payer: Self-pay | Admitting: Cardiovascular Disease

## 2023-04-16 ENCOUNTER — Other Ambulatory Visit: Payer: Self-pay

## 2023-04-16 DIAGNOSIS — I739 Peripheral vascular disease, unspecified: Secondary | ICD-10-CM

## 2023-04-24 ENCOUNTER — Encounter: Payer: Self-pay | Admitting: Neurology

## 2023-04-24 ENCOUNTER — Ambulatory Visit (INDEPENDENT_AMBULATORY_CARE_PROVIDER_SITE_OTHER): Payer: BC Managed Care – PPO | Admitting: Neurology

## 2023-04-24 ENCOUNTER — Ambulatory Visit: Payer: BC Managed Care – PPO | Attending: Cardiovascular Disease | Admitting: Cardiovascular Disease

## 2023-04-24 ENCOUNTER — Encounter: Payer: Self-pay | Admitting: Cardiovascular Disease

## 2023-04-24 VITALS — BP 170/73 | HR 83 | Ht 62.0 in | Wt 186.4 lb

## 2023-04-24 VITALS — BP 181/84 | HR 64 | Ht 62.0 in | Wt 186.5 lb

## 2023-04-24 DIAGNOSIS — I2510 Atherosclerotic heart disease of native coronary artery without angina pectoris: Secondary | ICD-10-CM | POA: Diagnosis not present

## 2023-04-24 DIAGNOSIS — E7800 Pure hypercholesterolemia, unspecified: Secondary | ICD-10-CM

## 2023-04-24 DIAGNOSIS — I739 Peripheral vascular disease, unspecified: Secondary | ICD-10-CM

## 2023-04-24 DIAGNOSIS — M7918 Myalgia, other site: Secondary | ICD-10-CM | POA: Diagnosis not present

## 2023-04-24 DIAGNOSIS — F17200 Nicotine dependence, unspecified, uncomplicated: Secondary | ICD-10-CM

## 2023-04-24 DIAGNOSIS — G44221 Chronic tension-type headache, intractable: Secondary | ICD-10-CM

## 2023-04-24 DIAGNOSIS — R0989 Other specified symptoms and signs involving the circulatory and respiratory systems: Secondary | ICD-10-CM | POA: Diagnosis not present

## 2023-04-24 DIAGNOSIS — M542 Cervicalgia: Secondary | ICD-10-CM | POA: Diagnosis not present

## 2023-04-24 DIAGNOSIS — I10 Essential (primary) hypertension: Secondary | ICD-10-CM

## 2023-04-24 DIAGNOSIS — I5032 Chronic diastolic (congestive) heart failure: Secondary | ICD-10-CM | POA: Diagnosis not present

## 2023-04-24 MED ORDER — TIZANIDINE HCL 4 MG PO TABS: 4.0000 mg | ORAL_TABLET | Freq: Three times a day (TID) | ORAL | 0 refills | Status: DC | PRN

## 2023-04-24 MED ORDER — TOPIRAMATE 25 MG PO TABS: 25.0000 mg | ORAL_TABLET | Freq: Every day | ORAL | 6 refills | Status: DC

## 2023-04-24 NOTE — Patient Instructions (Signed)
Referral to physical therapy for treatment of cervicalgia Start topiramate 25 mg, half tablet nightly for 1 week then increase to full tablet Use tizanidine up to twice daily as needed Please contact me if your headaches do not improve despite treatment Continue to follow with PCP and return as needed.

## 2023-04-24 NOTE — Progress Notes (Signed)
Cardiology Office Note:    Date:  04/24/2023   ID:  KEYSI SOLIDAY, DOB Nov 07, 1960, MRN [id number]  PCP:  Benita Stabile, MD   [facility] Providers Cardiologist:  Thurmon Fair, MD     Referring MD: Benita Stabile, MD   Chief Complaint  Patient presents with   130/61     History of Present Illness:    Cathy Jones is a 62 y.o. 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 Arbie Cookey and then had to have a thrombectomy of the femorofemoral bypass just a month later.  Due to ischemic rest pain both lower extremities, secondary to occlusion of the distal aorta she underwent repeat surgery on 08/26/2021 by Dr. Lenell Antu (removal of previous femorofemoral bypass, bilateral common femoral thromboendarterectomy and right axillary to bifemoral bypass with subcutaneous 8 mm PTFE graft).  She has had substantial improvement in symptoms since then.  She does not have intermittent claudication.  She does have periodic problems with lower extremity edema, but this has not been severe.  Denies exertional angina or dyspnea, orthopnea, PND, palpitations, dizziness or syncope or focal neurological complaints.  She brought in a detailed log of her blood pressure.  Frequently her blood pressure is quite normal or low in the 115/125/60-70 range.  Have at times her blood pressure can be mildly elevated, typically in the 140s/80s, occasionally higher than that.  Recently she had an upper story tract infection for which she received treatment with steroids that led to an increase in blood pressure.  She was briefly hospitalized at [facility]  02/16/2023 - 02/19/2023 and required intravenous nitroglycerin for a while.  An echocardiogram performed during that admission showed normal LVEF 60 to 65% with diastolic parameters suggestive of elevated left atrial filling pressures, no significant valve abnormalities.  She continues to smoke.  She started smoking around the age of 25 about half a pack a day for about 22 pack years.  Her husband continues to smoke as well.  Her most recent metabolic profile from May 2024 shows HDL 46, LDL 64, normal triglycerides and hemoglobin A1c.  Creatinine just a couple weeks ago was essentially normal normal at 1.13.   She has a longstanding history of asthma.  She has a remote history of cervical cancer for which she underwent total abdominal hysterectomy, chemotherapy and radiation therapy and later developed a rectal stricture for which she underwent diverting loop ileostomy and ureteral stent placement in March 2017. Subsequently underwent angioplasty-stent of the right iliac artery , but later had to undergo repeat right iliac thrombectomy and left to right femorofemoral bypass with 8 mm Hemashield graft on 08/10/2015 by Dr. Arbie Cookey and then had to have a thrombectomy of the femorofemoral bypass just a month later. She later presented with acute renal failure related to high output ostomy.  She had closure of the ileostomy later the same year.  She has had problems with recurrent colonic stricture and reaccumulation of fluid collections in the abdominal wall requiring aspiration. CT of the abdomen with contrast performed in September 2022 showed interval infrarenal aortic occlusion with collateral reconstitution of the left  common femoral artery and patent femorofemoral bypass.    Carotid duplex ultrasound performed in April 2022 showed mild atheromatous plaque of the internal carotid artery origins with less than 50% stenoses.  CT abdomen shows very severe atherosclerotic calcification of the distal aorta and  proximal common iliac arteries.  There was remarkably less atherosclerosis in the upper half of the abdominal aorta and the small portion of the thoracic aorta that is visible.  Similarly, there is no coronary artery calcification, although the heart is not entirely covered by the scan.  Past Medical History:  Diagnosis Date   Acute renal failure (ARF) (HCC) 08/07/2015   Allergy    Anemia    Anxiety    Asthma    pt brought her proair inhaler 06-06-14   Cancer (HCC)    cervical   Colon polyp    hyperplastic   Colon stricture (HCC)    Colonic obstruction (HCC)    Depression    Family history of adverse reaction to anesthesia    pts mother experiences nausea and vomiting    GERD (gastroesophageal reflux disease)    History of bronchitis    History of cervical cancer    Stage IB adenocarcinoma of the cervix, treated with rad hyst, 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   History of chemotherapy    History of hiatal hernia    History of radiation therapy    Hyperlipidemia    Hypertension    Iliac artery occlusion, 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   Migraine    "q couple years maybe" (08/07/2015)   Shortness of breath dyspnea    seasonal     Past Surgical History:  Procedure Laterality Date   ABDOMINAL AORTOGRAM W/LOWER EXTREMITY Bilateral 07/26/2021   Procedure: ABDOMINAL AORTOGRAM W/LOWER EXTREMITY;  Surgeon: Leonie Douglas, MD;  Location: [facility] INVASIVE CV LAB;  Service: Cardiovascular;  Laterality: Bilateral;   AXILLARY-FEMORAL BYPASS GRAFT Right 08/26/2021   Procedure: RIGHT AXILLO-BIFEMORAL BYPASS WITH GORETEC GRAFT;  Surgeon: Leonie Douglas, MD;  Location: [facility] OR;  Service: Vascular;  Laterality: Right;  right axilla to right groin, right groin to left groin   COLON RESECTION N/A 06/27/2015   Procedure: LYSIS OF ADHESIONS LOW ANTERIOR RESECTION DIVERTING LOOP ILEOSTOMY;  Surgeon: Romie Levee, MD;  Location: [facility] ORS;  Service: General;  Laterality: N/A;   COLON SURGERY     COLONOSCOPY      CYSTOSCOPY WITH STENT PLACEMENT Bilateral 06/27/2015   Procedure: CYSTOSCOPY WITH CATHETER  PLACEMENT;  Surgeon: Marcine Matar, MD;  Location: [facility] ORS;  Service: Urology;  Laterality: Bilateral;   DIVERTING ILEOSTOMY N/A 06/27/2015   Procedure: DIVERTING LOOP ILEOSTOMY;  Surgeon: Romie Levee, MD;  Location: [facility] ORS;  Service: General;  Laterality: N/A;   FEMORAL-FEMORAL BYPASS GRAFT Bilateral 08/10/2015   Procedure: BYPASS GRAFT LEFT FEMORALTO RIGHT FEMORAL ARTERY;  Surgeon: Larina Earthly, MD;  Location: [facility] OR;  Service: Vascular;  Laterality: Bilateral;   FLEXIBLE SIGMOIDOSCOPY  12/12/2015   Procedure: FLEXIBLE SIGMOIDOSCOPY;  Surgeon: Romie Levee, MD;  Location: [facility] ORS;  Service: General;;   ILEOSTOMY CLOSURE N/A 12/12/2015   Procedure: OPEN ILEOSTOMY REVERSAL AND PELVIC WASHOUT;  Surgeon: Romie Levee, MD;  Location: [facility] ORS;  Service: General;  Laterality: N/A;   INTRAOPERATIVE ARTERIOGRAM Right 08/10/2015   Procedure: INTRA OPERATIVE ARTERIOGRAM Right Iliac Artery;  Surgeon: Larina Earthly, MD;  Location: [facility] OR;  Service: Vascular;  Laterality: Right;   IR GENERIC HISTORICAL  09/12/2015   IR RADIOLOGIST EVAL & MGMT 09/12/2015  Ralene Muskrat, PA-C [facility] INTERV RAD   IR RADIOLOGIST EVAL & MGMT  06/23/2018   IR RADIOLOGIST EVAL & MGMT  07/07/2018   IV antibiotic infusion therapy      PERIPHERAL VASCULAR CATHETERIZATION N/A 08/09/2015   Procedure: Abdominal Aortogram w/Lower Extremity;  Surgeon: Fransisco Hertz, MD;  Location: [facility] INVASIVE CV LAB;  Service: Cardiovascular;  Laterality: N/A;   PERIPHERAL VASCULAR CATHETERIZATION Left 08/09/2015   Procedure: Peripheral Vascular Intervention;  Surgeon: Fransisco Hertz, MD;  Location: [facility] INVASIVE CV LAB;  Service: Cardiovascular;  Laterality: Left;  common iliac   PICC LINE PLACE PERIPHERAL ([facility] HX)     RADICAL ABDOMINAL HYSTERECTOMY  1994   THROMBECTOMY FEMORAL ARTERY Bilateral 09/05/2015   Procedure: THROMBECTOMY Left to Right Femoral to  FEMORAL  ARTERY Bypass,.;  Surgeon: Pryor Ochoa, MD;  Location: [facility] OR;  Service: Vascular;  Laterality: Bilateral;   THROMBECTOMY ILIAC ARTERY Right 08/10/2015   Procedure: THROMBECTOMY Right ILIAC ARTERY;  Surgeon: Larina Earthly, MD;  Location: [facility] OR;  Service: Vascular;  Laterality: Right;   TUBAL LIGATION     UPPER GASTROINTESTINAL ENDOSCOPY      Current Medications: Current Meds  Medication Sig   aspirin EC 81 MG tablet Take 81 mg by mouth daily. Swallow whole.   carvedilol (COREG) 12.5 MG tablet Take 1 tablet (12.5 mg total) by mouth 2 (two) times daily with a meal.   ezetimibe (ZETIA) 10 MG tablet Take 1 tablet (10 mg total) by mouth daily.   furosemide (LASIX) 40 MG tablet Take 1 tablet (40 mg total) by mouth daily. (Patient taking differently: Take 20 mg by mouth daily.)   losartan (COZAAR) 50 MG tablet Take 1 tablet (50 mg total) by mouth daily.   rosuvastatin (CRESTOR) 10 MG tablet TAKE 1 TABLET(10 MG) BY MOUTH DAILY   Vitamin D, Ergocalciferol, (DRISDOL) 1.25 MG (50000 UNIT) CAPS capsule Take 50,000 Units by mouth every Saturday.     Allergies:   Codeine   Social History   Socioeconomic History   Marital status: Married    Spouse name: Not on file   Number of children: 1   Years of education: Not on file   Highest education level: Not on file  Occupational History   Not on file  Tobacco Use   Smoking status: Every Day    Current packs/day: 0.25    Average packs/day: 0.3 packs/day for 47.9 years (12.0 ttl pk-yrs)    Types: Cigarettes    Start date: 05/17/1975   Smokeless tobacco: Never   Tobacco comments:    Less than 5 cigarettes per day  Vaping Use   Vaping status: Former   Devices: e-cigrette with out nicotene for of the ostomy  Substance and Sexual Activity   Alcohol use: No    Alcohol/week: 0.0 standard drinks of alcohol   Drug use: Not Currently    Types: Marijuana    Comment: smoked marijuana last around 07/2021   Sexual activity: Not Currently  Other Topics  Concern   Not on file  Social History Narrative   Lives with husband,daughter,grand kids    Works from home    Social Drivers of Health   Financial Resource Strain: Unknown (06/07/2018)   Overall Financial Resource Strain (CARDIA)    Difficulty of Paying Living Expenses: Patient declined  Food Insecurity: No Food Insecurity (02/17/2023)   Hunger Vital Sign    Worried About Running Out of Food in the Last Year: Never true  Ran Out of Food in the Last Year: Never true  Transportation Needs: No Transportation Needs (02/17/2023)   PRAPARE - Administrator, Civil Service (Medical): No    Lack of Transportation (Non-Medical): No  Physical Activity: Unknown (06/07/2018)   Exercise Vital Sign    Days of Exercise per Week: Patient declined    Minutes of Exercise per Session: Patient declined  Stress: Unknown (06/07/2018)   [employer] of Occupational Health - Occupational Stress Questionnaire    Feeling of Stress : Patient declined  Social Connections: Unknown (06/07/2018)   Social Connection and Isolation Panel [NHANES]    Frequency of Communication with Friends and Family: Patient declined    Frequency of Social Gatherings with Friends and Family: Patient declined    Attends Religious Services: Patient declined    Database administrator or Organizations: Patient declined    Attends Engineer, structural: Patient declined    Marital Status: Patient declined     Family History: The patient's family history includes Breast cancer in her maternal aunt and maternal aunt; CAD in her father; Luiz Blare' disease in her father; Hyperparathyroidism in her mother; Hypertension in her father and mother; Migraines in her daughter and mother; Peripheral vascular disease in her father; Prostate cancer in her paternal uncle; Rectal cancer in her cousin. There is no history of Colon cancer, Stomach cancer, Esophageal cancer, Deep vein thrombosis, Uterine cancer, Endometrial cancer, or  Pancreatic cancer.  ROS:   Please see the history of present illness.     All other systems reviewed and are negative.  EKGs/Labs/Other Studies Reviewed:    The following studies were reviewed today: Echocardiogram 02/17/2023  1. Left ventricular ejection fraction, by estimation, is 60 to 65%. The  left ventricle has normal function. The left ventricle has no regional  wall motion abnormalities. Left ventricular diastolic parameters are  indeterminate. Elevated left ventricular  end-diastolic pressure.   2. Right ventricular systolic function is normal. The right ventricular  size is normal. Tricuspid regurgitation signal is inadequate for assessing  PA pressure.   3. Left atrial size was mildly dilated.   4. The mitral valve is grossly normal. Trivial mitral valve  regurgitation. No evidence of mitral stenosis.   5. The aortic valve was not well visualized. Aortic valve regurgitation  is mild. No aortic stenosis is present.   6. The inferior vena cava is dilated in size with <50% respiratory  variability, suggesting right atrial pressure of 15 mmHg.   EKG: Personally reviewed the tracing from 03/13/2023 which shows normal sinus rhythm, normal tracing  EKG Interpretation Date/Time:    Ventricular Rate:    PR Interval:    QRS Duration:    QT Interval:    QTC Calculation:   R Axis:      Text Interpretation:           Recent Labs: 02/17/2023: ALT 10 02/18/2023: B Natriuretic Peptide 317.0; Hemoglobin 13.7; Magnesium 2.2; Platelets 229 04/02/2023: BUN 12; Creatinine, Ser 1.13; Potassium 5.0; Sodium 141  Recent Lipid Panel No results found for: "CHOL", "TRIG", "HDL", "CHOLHDL", "VLDL", "LDLCALC", "LDLDIRECT"   Risk Assessment/Calculations:           Physical Exam:    VS:  BP (!) 170/73 (BP Location: Left Arm, Patient Position: Sitting)   Pulse 83   Ht 5\' 2"  (1.575 m)   Wt 186 lb 6.4 oz (84.6 kg)   SpO2 98%   BMI 34.09 kg/m     Wt Readings  from Last 3  Encounters:  04/24/23 186 lb 6.4 oz (84.6 kg)  04/24/23 186 lb 8 oz (84.6 kg)  03/24/23 180 lb (81.6 kg)      General: Alert, oriented x3, no distress, smiling Head: no evidence of trauma, PERRL, EOMI, no exophtalmos or lid lag, no myxedema, no xanthelasma; normal ears, nose and oropharynx Neck: normal jugular venous pulsations and no hepatojugular reflux; distinct right carotid bruit Chest: clear to auscultation, no signs of consolidation by percussion or palpation, normal fremitus, symmetrical and full respiratory excursions Cardiovascular: Pulsation and bruit of right axillary to bifemoral bypass noted in the left lateral chest, normal position and quality of the apical impulse, regular rhythm, normal first and second heart sounds, no murmurs, rubs or gallops Abdomen: no tenderness or distention, no masses by palpation, no abnormal pulsatility or arterial bruits, normal bowel sounds, no hepatosplenomegaly Extremities: no clubbing, cyanosis or edema; palpable pulses in bilateral feet  Neurological: grossly nonfocal Psych: Normal mood and affect   ASSESSMENT:    1. Chronic diastolic heart failure (HCC)   2. PAD (peripheral artery disease) (HCC)   3. Right carotid bruit   4. Coronary artery disease involving native coronary artery of native heart without angina pectoris   5. Essential hypertension   6. Pure hypercholesterolemia   7. Smoking      PLAN:    In order of problems listed above:  CHF: Had transient evidence of acute diastolic heart failure during hypertensive emergency after administration of corticosteroids.  Currently appears to be euvolemic on a very low-dose of loop diuretics.   PAD: She does not have intermittent claudication.  Improved functional status following revascularization of the lower extremities with axillary bifemoral bypass in April 2023.  Seeing Dr. Lenell Antu after Dr. Remonia Richter retirement. Right carotid bruit: Previous ultrasonography shows mild  heterogenous plaque in both internal carotid arteries without significant stenosis. CAD: Asymptomatic.  No angina pectoris.  Coronary CT angiogram showed elevated coronary calcium score (305, 97 percentile) with mild plaque in the LAD and RCA and a difficult to assess LCx due to study quality.  HTN: Most of the time her blood pressure is well-controlled.  Reminded her to check her blood pressure when she is fully relaxed, at rest for at least 10 minutes. HLP: Most lipid profile shows excellent reduction in LDL cholesterol at 64 and normal HDL and triglyceride levels. Smoking: Encouraged her to completely quit smoking.  This is challenging since her husband does not think he will ever quit smoking.  Family/social stressors are also an issue, since her daughter and grandchildren have moved in with them.           Medication Adjustments/Labs and Tests Ordered: Current medicines are reviewed at length with the patient today.  Concerns regarding medicines are outlined above.  No orders of the defined types were placed in this encounter.  No orders of the defined types were placed in this encounter.   Patient Instructions  Medication Instructions:  No changes *If you need a refill on your cardiac medications before your next appointment, please call your pharmacy*  Follow-Up: At [facility], you and your health needs are our priority.  As part of our continuing mission to provide you with exceptional heart care, we have created designated Provider Care Teams.  These Care Teams include your primary Cardiologist (physician) and Advanced Practice Providers (APPs -  Physician Assistants and Nurse Practitioners) who all work together to provide you with the care you need, when you need  it.  We recommend signing up for the patient portal called "MyChart".  Sign up information is provided on this After Visit Summary.  MyChart is used to connect with patients for Virtual Visits (Telemedicine).   Patients are able to view lab/test results, encounter notes, upcoming appointments, etc.  Non-urgent messages can be sent to your provider as well.   To learn more about what you can do with MyChart, go to [email or web address].    Your next appointment:   1 year(s)  Provider:   Thurmon Fair, MD       Signed, Thurmon Fair, MD  04/24/2023 9:14 PM    [facility]

## 2023-04-24 NOTE — Progress Notes (Signed)
[facility]  PATIENT: Cathy Jones DOB: [birth year]  REQUESTING CLINICIAN: Benita Stabile, MD HISTORY FROM: Patient  REASON FOR VISIT: Headaches    HISTORICAL  CHIEF COMPLAINT:  Chief Complaint  Patient presents with   New Patient (Initial Visit)    Pt in 13 Pt here for headaches Pt states daily headaches . Pt states headache pain starts in neck and travels to top of head Pt states feels like ice pick stabbing her in top of head     HISTORY OF PRESENT ILLNESS:  This is a 62 year old woman past medical history of hypertension, heart disease, kidney disease who is presenting for management of her headaches.  Patient reports a history of migraines, has been migraine free for the past 20 years but in the past 2 months she has been having new type of headache that started in the base of her neck and radiates to the top of her head.  She described the headaches are sharp stabbing pain.  These headaches occur daily, sometimes they can last half a day and others the entire day.  During this time also patient has been struggling with elevated blood pressure.  She is on 3 antihypertensive medications but her blood pressure still fluctuate.  She does have sensitivity to light and noise with the headaches but no nausea no vomiting.  Again these headaches are different from her typical migraines.  She has been taking ibuprofen but without relief.   OTHER MEDICAL CONDITIONS: Hypertension, CAD, Kidney disease   REVIEW OF SYSTEMS: Full 14 system review of systems performed and negative with exception of: As noted in the HPI   ALLERGIES: Allergies  Allergen Reactions   Codeine Other (See Comments) and Hypertension    "hyper"    HOME MEDICATIONS: Outpatient Medications Prior to Visit  Medication Sig Dispense Refill   albuterol (VENTOLIN HFA) 108 (90 Base) MCG/ACT inhaler Inhale 2 puffs into the lungs every 4 (four) hours as needed.     aspirin EC 81 MG tablet Take 81 mg by  mouth daily. Swallow whole.     carvedilol (COREG) 12.5 MG tablet Take 1 tablet (12.5 mg total) by mouth 2 (two) times daily with a meal. 60 tablet 11   ezetimibe (ZETIA) 10 MG tablet Take 1 tablet (10 mg total) by mouth daily. 90 tablet 1   furosemide (LASIX) 40 MG tablet Take 1 tablet (40 mg total) by mouth daily. (Patient taking differently: Take 20 mg by mouth daily.) 30 tablet 11   losartan (COZAAR) 50 MG tablet Take 1 tablet (50 mg total) by mouth daily. 30 tablet 11   rosuvastatin (CRESTOR) 10 MG tablet TAKE 1 TABLET(10 MG) BY MOUTH DAILY 90 tablet 0   Vitamin D, Ergocalciferol, (DRISDOL) 1.25 MG (50000 UNIT) CAPS capsule Take 50,000 Units by mouth every Saturday.     No facility-administered medications prior to visit.    PAST MEDICAL HISTORY: Past Medical History:  Diagnosis Date   Acute renal failure (ARF) (HCC) 08/07/2015   Allergy    Anemia    Anxiety    Asthma    pt brought her proair inhaler 06-06-14   Cancer (HCC)    cervical   Colon polyp    hyperplastic   Colon stricture (HCC)    Colonic obstruction (HCC)    Depression    Family history of adverse reaction to anesthesia    pts mother experiences nausea and vomiting    GERD (gastroesophageal reflux disease)    History  of bronchitis    History of cervical cancer    Stage IB adenocarcinoma of the cervix, treated with rad hyst, 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   History of chemotherapy    History of hiatal hernia    History of radiation therapy    Hyperlipidemia    Hypertension    Iliac artery occlusion, 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   Migraine    "q couple years maybe" (08/07/2015)   Shortness of breath dyspnea    seasonal     PAST SURGICAL HISTORY: Past Surgical History:  Procedure Laterality Date   ABDOMINAL AORTOGRAM W/LOWER EXTREMITY Bilateral 07/26/2021   Procedure: ABDOMINAL AORTOGRAM W/LOWER EXTREMITY;  Surgeon: Leonie Douglas, MD;  Location: [facility] INVASIVE CV LAB;  Service: Cardiovascular;  Laterality: Bilateral;    AXILLARY-FEMORAL BYPASS GRAFT Right 08/26/2021   Procedure: RIGHT AXILLO-BIFEMORAL BYPASS WITH GORETEC GRAFT;  Surgeon: Leonie Douglas, MD;  Location: [facility] OR;  Service: Vascular;  Laterality: Right;  right axilla to right groin, right groin to left groin   COLON RESECTION N/A 06/27/2015   Procedure: LYSIS OF ADHESIONS LOW ANTERIOR RESECTION DIVERTING LOOP ILEOSTOMY;  Surgeon: Romie Levee, MD;  Location: [facility] ORS;  Service: General;  Laterality: N/A;   COLON SURGERY     COLONOSCOPY     CYSTOSCOPY WITH STENT PLACEMENT Bilateral 06/27/2015   Procedure: CYSTOSCOPY WITH CATHETER  PLACEMENT;  Surgeon: Marcine Matar, MD;  Location: [facility] ORS;  Service: Urology;  Laterality: Bilateral;   DIVERTING ILEOSTOMY N/A 06/27/2015   Procedure: DIVERTING LOOP ILEOSTOMY;  Surgeon: Romie Levee, MD;  Location: [facility];  Service: General;  Laterality: N/A;   FEMORAL-FEMORAL BYPASS GRAFT Bilateral 08/10/2015   Procedure: BYPASS GRAFT LEFT FEMORALTO RIGHT FEMORAL ARTERY;  Surgeon: Larina Earthly, MD;  Location: [facility] OR;  Service: Vascular;  Laterality: Bilateral;   FLEXIBLE SIGMOIDOSCOPY  12/12/2015   Procedure: FLEXIBLE SIGMOIDOSCOPY;  Surgeon: Romie Levee, MD;  Location: [facility] ORS;  Service: General;;   ILEOSTOMY CLOSURE N/A 12/12/2015   Procedure: OPEN ILEOSTOMY REVERSAL AND PELVIC WASHOUT;  Surgeon: Romie Levee, MD;  Location: [facility] ORS;  Service: General;  Laterality: N/A;   INTRAOPERATIVE ARTERIOGRAM Right 08/10/2015   Procedure: INTRA OPERATIVE ARTERIOGRAM Right Iliac Artery;  Surgeon: Larina Earthly, MD;  Location: [facility] OR;  Service: Vascular;  Laterality: Right;   IR GENERIC HISTORICAL  09/12/2015   IR RADIOLOGIST EVAL & MGMT 09/12/2015 Ralene Muskrat, PA-C [facility] INTERV RAD   IR RADIOLOGIST EVAL & MGMT  06/23/2018   IR RADIOLOGIST EVAL & MGMT  07/07/2018   IV antibiotic infusion therapy      PERIPHERAL VASCULAR CATHETERIZATION N/A 08/09/2015   Procedure: Abdominal Aortogram w/Lower Extremity;  Surgeon: Fransisco Hertz, MD;  Location: [facility] INVASIVE CV LAB;  Service: Cardiovascular;  Laterality: N/A;   PERIPHERAL VASCULAR CATHETERIZATION Left 08/09/2015   Procedure: Peripheral Vascular Intervention;  Surgeon: Fransisco Hertz, MD;  Location: [facility] INVASIVE CV LAB;  Service: Cardiovascular;  Laterality: Left;  common iliac   PICC LINE PLACE PERIPHERAL ([facility] HX)     RADICAL ABDOMINAL HYSTERECTOMY  1994   THROMBECTOMY FEMORAL ARTERY Bilateral 09/05/2015   Procedure: THROMBECTOMY Left to Right Femoral to  FEMORAL ARTERY Bypass,.;  Surgeon: Pryor Ochoa, MD;  Location: [facility] OR;  Service: Vascular;  Laterality: Bilateral;   THROMBECTOMY ILIAC ARTERY Right 08/10/2015   Procedure: THROMBECTOMY Right ILIAC ARTERY;  Surgeon: Larina Earthly, MD;  Location: [facility] OR;  Service: Vascular;  Laterality: Right;   TUBAL LIGATION  UPPER GASTROINTESTINAL ENDOSCOPY      FAMILY HISTORY: Family History  Problem Relation Age of Onset   Hypertension Mother    Hyperparathyroidism Mother    Migraines Mother    Hypertension Father    CAD Father        CABG   Peripheral vascular disease Father    Graves' disease Father    Breast cancer Maternal Aunt    Breast cancer Maternal Aunt    Prostate cancer Paternal Uncle    Rectal cancer Cousin    Migraines Daughter    Colon cancer Neg Hx    Stomach cancer Neg Hx    Esophageal cancer Neg Hx    Deep vein thrombosis Neg Hx    Uterine cancer Neg Hx    Endometrial cancer Neg Hx    Pancreatic cancer Neg Hx     SOCIAL HISTORY: Social History   Socioeconomic History   Marital status: Married    Spouse name: Not on file   Number of children: 1   Years of education: Not on file   Highest education level: Not on file  Occupational History   Not on file  Tobacco Use   Smoking status: Every Day    Current packs/day: 0.25    Average packs/day: 0.3 packs/day for 47.9 years (12.0 ttl pk-yrs)    Types: Cigarettes    Start date: 05/17/1975   Smokeless tobacco: Never   Tobacco comments:     Less than 5 cigarettes per day  Vaping Use   Vaping status: Former   Devices: e-cigrette with out nicotene for of the ostomy  Substance and Sexual Activity   Alcohol use: No    Alcohol/week: 0.0 standard drinks of alcohol   Drug use: Not Currently    Types: Marijuana    Comment: smoked marijuana last around 07/2021   Sexual activity: Not Currently  Other Topics Concern   Not on file  Social History Narrative   Lives with husband,daughter,grand kids    Works from home    Social Drivers of Health   Financial Resource Strain: Unknown (06/07/2018)   Overall Financial Resource Strain (CARDIA)    Difficulty of Paying Living Expenses: Patient declined  Food Insecurity: No Food Insecurity (02/17/2023)   Hunger Vital Sign    Worried About Running Out of Food in the Last Year: Never true    Ran Out of Food in the Last Year: Never true  Transportation Needs: No Transportation Needs (02/17/2023)   PRAPARE - Administrator, Civil Service (Medical): No    Lack of Transportation (Non-Medical): No  Physical Activity: Unknown (06/07/2018)   Exercise Vital Sign    Days of Exercise per Week: Patient declined    Minutes of Exercise per Session: Patient declined  Stress: Unknown (06/07/2018)   [employer] of Occupational Health - Occupational Stress Questionnaire    Feeling of Stress : Patient declined  Social Connections: Unknown (06/07/2018)   Social Connection and Isolation Panel [NHANES]    Frequency of Communication with Friends and Family: Patient declined    Frequency of Social Gatherings with Friends and Family: Patient declined    Attends Religious Services: Patient declined    Database administrator or Organizations: Patient declined    Attends Banker Meetings: Patient declined    Marital Status: Patient declined  Intimate Partner Violence: Not At Risk (02/17/2023)   Humiliation, Afraid, Rape, and Kick questionnaire    Fear of Current or Ex-Partner: No  Emotionally Abused: No    Physically Abused: No    Sexually Abused: No    PHYSICAL EXAM  GENERAL EXAM/CONSTITUTIONAL: Vitals:  Vitals:   04/24/23 1020  BP: (!) 181/84  Pulse: 64  Weight: 186 lb 8 oz (84.6 kg)  Height: 5\' 2"  (1.575 m)   Body mass index is 34.11 kg/m. Wt Readings from Last 3 Encounters:  04/24/23 186 lb 8 oz (84.6 kg)  03/24/23 180 lb (81.6 kg)  03/13/23 180 lb (81.6 kg)   Patient is in no distress; well developed, nourished and groomed; neck is supple  MUSCULOSKELETAL: Gait, strength, tone, movements noted in Neurologic exam below  NEUROLOGIC: MENTAL STATUS:      No data to display         awake, alert, oriented to person, place and time recent and remote memory intact normal attention and concentration language fluent, comprehension intact, naming intact fund of knowledge appropriate  CRANIAL NERVE:  2nd - no papilledema or hemorrhages on fundoscopic exam 2nd, 3rd, 4th, 6th - pupils equal and reactive to light, visual fields full to confrontation, extraocular muscles intact, no nystagmus 5th - facial sensation symmetric 7th - facial strength symmetric 8th - hearing intact 9th - palate elevates symmetrically, uvula midline 11th - shoulder shrug symmetric 12th - tongue protrusion midline  MOTOR:  normal bulk and tone, full strength in the BUE, BLE. There is tenderness to palpation in the paraspinal cervical muscle.   SENSORY:  normal and symmetric to light touch  COORDINATION:  finger-nose-finger, fine finger movements normal  GAIT/STATION:  normal   DIAGNOSTIC DATA (LABS, IMAGING, TESTING) - I reviewed patient records, labs, notes, testing and imaging myself where available.  Lab Results  Component Value Date   WBC 15.1 (H) 02/18/2023   HGB 13.7 02/18/2023   HCT 43.6 02/18/2023   MCV 84.2 02/18/2023   PLT 229 02/18/2023      Component Value Date/Time   NA 141 04/02/2023 0927   K 5.0 04/02/2023 0927   CL 102 04/02/2023  0927   CO2 24 04/02/2023 0927   GLUCOSE 104 (H) 04/02/2023 0927   GLUCOSE 103 (H) 02/18/2023 0444   BUN 12 04/02/2023 0927   CREATININE 1.13 (H) 04/02/2023 0927   CALCIUM 9.8 04/02/2023 0927   PROT 5.7 (L) 02/17/2023 0238   ALBUMIN 3.1 (L) 02/17/2023 0238   AST 10 (L) 02/17/2023 0238   ALT 10 02/17/2023 0238   ALKPHOS 85 02/17/2023 0238   BILITOT 1.2 02/17/2023 0238   GFRNONAA 50 (L) 02/18/2023 0444   GFRAA >60 06/10/2018 0439   No results found for: "CHOL", "HDL", "LDLCALC", "LDLDIRECT", "TRIG", "CHOLHDL" No results found for: "HGBA1C" Lab Results  Component Value Date   VITAMINB12 232 08/02/2015   No results found for: "TSH"  Head CT 02/16/2023 No acute intracranial process.     ASSESSMENT AND PLAN  62 y.o. year old female with history of hypertension, migraine headaches, heart disease, kidney disease who is presenting with new type of headache that started in the past 2 months.  Based on description, patient likely has tension type headaches.  There is also evidence of cervicalgia as she does also have tenderness to palpation in the paraspinal cervical muscles.  My plan is to start her on topiramate nightly to decrease the frequency of these headaches, muscle relaxant, tizanidine as needed, but the main treatment will be physical therapy with dry needling, TENS unit and acupuncture if possible.  This was explained to the patient and she is  comfortable with plans.  Advised her to contact me if her headaches do not improve despite this treatment.  Otherwise she can continue to follow with PCP and return as needed.   1. Chronic tension-type headache, intractable   2. Cervical myofascial pain syndrome   3. Cervicalgia     Patient Instructions  Referral to physical therapy for treatment of cervicalgia Start topiramate 25 mg, half tablet nightly for 1 week then increase to full tablet Use tizanidine up to twice daily as needed Please contact me if your headaches do not improve  despite treatment Continue to follow with PCP and return as needed.   Orders Placed This Encounter  Procedures   Ambulatory referral to Physical Therapy    Meds ordered this encounter  Medications   tiZANidine (ZANAFLEX) 4 MG tablet    Sig: Take 1 tablet (4 mg total) by mouth every 8 (eight) hours as needed for muscle spasms.    Dispense:  30 tablet    Refill:  0   topiramate (TOPAMAX) 25 MG tablet    Sig: Take 1 tablet (25 mg total) by mouth daily.    Dispense:  30 tablet    Refill:  6    Return if symptoms worsen or fail to improve.    Windell Norfolk, MD 04/24/2023, 11:18 AM  [facility] [address], [address] [place], [place] [address] [phone]

## 2023-04-24 NOTE — Patient Instructions (Signed)

## 2023-04-27 ENCOUNTER — Other Ambulatory Visit: Payer: Self-pay | Admitting: Cardiovascular Disease

## 2023-04-27 ENCOUNTER — Telehealth: Payer: Self-pay | Admitting: Cardiovascular Disease

## 2023-04-27 MED ORDER — ROSUVASTATIN CALCIUM 10 MG PO TABS: 10.0000 mg | ORAL_TABLET | Freq: Every day | ORAL | 3 refills | Status: DC

## 2023-04-27 NOTE — Telephone Encounter (Signed)
Pt's medication was sent to pt's pharmacy as requested. Confirmation received.  °

## 2023-04-27 NOTE — Telephone Encounter (Signed)
*  STAT* If patient is at the pharmacy, call can be transferred to refill team.   1. Which medications need to be refilled? (please list name of each medication and dose if known) rosuvastatin (CRESTOR) 10 MG tablet    2. Would you like to learn more about the convenience, safety, & potential cost savings by using the [facility]?    3. Are you open to using the [facility] (Type [facility]. ).   4. Which pharmacy/location (including street and city if local pharmacy) is medication to be sent to? [id number] - [place], [place] - [address]    5. Do they need a 30 day or 90 day supply? 90

## 2023-04-28 ENCOUNTER — Ambulatory Visit (HOSPITAL_COMMUNITY): Payer: BC Managed Care – PPO

## 2023-04-28 ENCOUNTER — Encounter (HOSPITAL_COMMUNITY): Payer: BC Managed Care – PPO

## 2023-04-28 ENCOUNTER — Ambulatory Visit: Payer: BC Managed Care – PPO | Admitting: Vascular Surgery

## 2023-04-29 NOTE — Therapy (Signed)
OUTPATIENT PHYSICAL THERAPY CERVICAL EVALUATION   Patient Name: Cathy Jones MRN: [id number] DOB:1961/01/22, 62 y.o., female Today's Date: 04/30/2023  END OF SESSION:  PT End of Session - 04/30/23 0950     Visit Number 1    Number of Visits 8    Date for PT Re-Evaluation 05/28/23    Authorization Type BCBS    PT Start Time 0850    PT Stop Time 0940    PT Time Calculation (min) 50 min    Activity Tolerance Patient tolerated treatment well    Behavior During Therapy [facility] for tasks assessed/performed             Past Medical History:  Diagnosis Date   Acute renal failure (ARF) (HCC) 08/07/2015   Allergy    Anemia    Anxiety    Asthma    pt brought her proair inhaler 06-06-14   Cancer (HCC)    cervical   Colon polyp    hyperplastic   Colon stricture (HCC)    Colonic obstruction (HCC)    Depression    Family history of adverse reaction to anesthesia    pts mother experiences nausea and vomiting    GERD (gastroesophageal reflux disease)    History of bronchitis    History of cervical cancer    Stage IB adenocarcinoma of the cervix, treated with rad hyst, 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   History of chemotherapy    History of hiatal hernia    History of radiation therapy    Hyperlipidemia    Hypertension    Iliac artery occlusion, 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   Migraine    "q couple years maybe" (08/07/2015)   Shortness of breath dyspnea    seasonal    Past Surgical History:  Procedure Laterality Date   ABDOMINAL AORTOGRAM W/LOWER EXTREMITY Bilateral 07/26/2021   Procedure: ABDOMINAL AORTOGRAM W/LOWER EXTREMITY;  Surgeon: Leonie Douglas, MD;  Location: [facility] INVASIVE CV LAB;  Service: Cardiovascular;  Laterality: Bilateral;   AXILLARY-FEMORAL BYPASS GRAFT Right 08/26/2021   Procedure: RIGHT AXILLO-BIFEMORAL BYPASS WITH GORETEC GRAFT;  Surgeon: Leonie Douglas, MD;  Location: [facility] OR;  Service: Vascular;  Laterality: Right;  right axilla to right groin, right groin to left  groin   COLON RESECTION N/A 06/27/2015   Procedure: LYSIS OF ADHESIONS LOW ANTERIOR RESECTION DIVERTING LOOP ILEOSTOMY;  Surgeon: Romie Levee, MD;  Location: [facility] ORS;  Service: General;  Laterality: N/A;   COLON SURGERY     COLONOSCOPY     CYSTOSCOPY WITH STENT PLACEMENT Bilateral 06/27/2015   Procedure: CYSTOSCOPY WITH CATHETER  PLACEMENT;  Surgeon: Marcine Matar, MD;  Location: [facility] ORS;  Service: Urology;  Laterality: Bilateral;   DIVERTING ILEOSTOMY N/A 06/27/2015   Procedure: DIVERTING LOOP ILEOSTOMY;  Surgeon: Romie Levee, MD;  Location: [facility] ORS;  Service: General;  Laterality: N/A;   FEMORAL-FEMORAL BYPASS GRAFT Bilateral 08/10/2015   Procedure: BYPASS GRAFT LEFT FEMORALTO RIGHT FEMORAL ARTERY;  Surgeon: Larina Earthly, MD;  Location: [facility] OR;  Service: Vascular;  Laterality: Bilateral;   FLEXIBLE SIGMOIDOSCOPY  12/12/2015   Procedure: FLEXIBLE SIGMOIDOSCOPY;  Surgeon: Romie Levee, MD;  Location: [facility] ORS;  Service: General;;   ILEOSTOMY CLOSURE N/A 12/12/2015   Procedure: OPEN ILEOSTOMY REVERSAL AND PELVIC WASHOUT;  Surgeon: Romie Levee, MD;  Location: [facility] ORS;  Service: General;  Laterality: N/A;   INTRAOPERATIVE ARTERIOGRAM Right 08/10/2015   Procedure: INTRA OPERATIVE ARTERIOGRAM Right Iliac Artery;  Surgeon: Larina Earthly, MD;  Location: [facility] OR;  Service: Vascular;  Laterality: Right;   IR GENERIC HISTORICAL  09/12/2015   IR RADIOLOGIST EVAL & MGMT 09/12/2015 Ralene Muskrat, PA-C [facility] INTERV RAD   IR RADIOLOGIST EVAL & MGMT  06/23/2018   IR RADIOLOGIST EVAL & MGMT  07/07/2018   IV antibiotic infusion therapy      PERIPHERAL VASCULAR CATHETERIZATION N/A 08/09/2015   Procedure: Abdominal Aortogram w/Lower Extremity;  Surgeon: Fransisco Hertz, MD;  Location: [facility] INVASIVE CV LAB;  Service: Cardiovascular;  Laterality: N/A;   PERIPHERAL VASCULAR CATHETERIZATION Left 08/09/2015   Procedure: Peripheral Vascular Intervention;  Surgeon: Fransisco Hertz, MD;  Location: [facility] INVASIVE CV LAB;  Service:  Cardiovascular;  Laterality: Left;  common iliac   PICC LINE PLACE PERIPHERAL ([facility] HX)     RADICAL ABDOMINAL HYSTERECTOMY  1994   THROMBECTOMY FEMORAL ARTERY Bilateral 09/05/2015   Procedure: THROMBECTOMY Left to Right Femoral to  FEMORAL ARTERY Bypass,.;  Surgeon: Pryor Ochoa, MD;  Location: [facility] OR;  Service: Vascular;  Laterality: Bilateral;   THROMBECTOMY ILIAC ARTERY Right 08/10/2015   Procedure: THROMBECTOMY Right ILIAC ARTERY;  Surgeon: Larina Earthly, MD;  Location: [facility] OR;  Service: Vascular;  Laterality: Right;   TUBAL LIGATION     UPPER GASTROINTESTINAL ENDOSCOPY     Patient Active Problem List   Diagnosis Date Noted   Vaginal vault smear abnormal 03/24/2023   Atrophic vaginitis 03/24/2023   Abnormal radiologic density 03/24/2023   Cigarette smoker 03/24/2023   Acute pulmonary edema (HCC) 02/17/2023   Elevated brain natriuretic peptide (BNP) level 02/17/2023   Elevated troponin 02/17/2023   Headache 02/17/2023   Multiple lung nodules on CT 02/17/2023   Hypertensive emergency 02/16/2023   Acute maxillary sinusitis 02/04/2023   Pelvic mass in female 01/31/2021   Malignant neoplasm of cervix (HCC) 01/31/2021   Peripheral vascular disease (HCC) 11/05/2020   Vitamin D deficiency 11/05/2020   History of malignant neoplasm of cervix 11/05/2020   Carotid artery stenosis 11/05/2020   Nicotine dependence with current use 11/05/2020   Osteopenia 10/05/2019   Cervical high risk HPV (human papillomavirus) test positive 10/05/2019   Generalized abdominal pain    Pelvic fluid collection    Pelvic abscess in female 06/07/2018   Tobacco abuse 06/07/2018   PAD (peripheral artery disease) (HCC) 08/10/2015   Injury of kidney 08/07/2015   B12 deficiency anemia 08/02/2015   Folic acid deficiency anemia 08/02/2015   Essential hypertension 07/30/2015   Mixed hyperlipidemia 07/30/2015   Depression 07/30/2015   Ileostomy in place [facility]) 07/30/2015   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/2015   Status post ileostomy (HCC) 07/30/2015   History of major abdominal surgery 07/30/2015   Rectal stricture s/p LAR resection [id number] 06/27/2015   Hypertension 06/08/2015   Pure hypercholesterolemia 06/08/2015   Obesity 06/08/2015   Migraine headache 06/08/2015   Elevated glucose 06/08/2015   Asthmatic bronchitis 06/08/2015   Anxiety 06/08/2015   Allergic rhinitis 06/08/2015   Compound nevus 08/19/2011    PCP: Benita Stabile, MD  REFERRING PROVIDER: Windell Norfolk, MD  REFERRING DIAG: [phone] (ICD-10-CM) - Chronic tension-type headache, intractable M79.18 (ICD-10-CM) - Cervical myofascial pain syndrome M54.2 (ICD-10-CM) - Cervicalgia  THERAPY DIAG:  Chronic tension-type headache, intractable  Cervical myofascial pain syndrome  Cervicalgia  Rationale for Evaluation and Treatment: Rehabilitation  ONSET DATE: several months ago  SUBJECTIVE:  SUBJECTIVE STATEMENT: Used to have migraines but then they went away gradually with imetrex shots; past few months started having daily headaches but not migraine level; running up the back of the neck.  Was in the hospital recently for some respiratory issues and had elevated BP with taking steroids; thought perhaps headache was due to high BP but that has now resolved and headaches remain.  She works 3rd shift so headaches seem to come on at night.  Has an Automotive engineer. Saw [facility] after hospitalization and he recommended therapy for DN due to tight neck muscles.   Hand dominance: Right  PERTINENT HISTORY:  migraine  PAIN:  Are you having pain? Yes: NPRS scale: 0-6/10 Pain location: back of neck, back of head Pain description: radiates from neck Aggravating factors: nighttime working Relieving factors: ibuprofen  PRECAUTIONS:  None  RED FLAGS: None     WEIGHT BEARING RESTRICTIONS: No  FALLS:  Has patient fallen in last 6 months? No  OCCUPATION: transcribing   PLOF: Independent  PATIENT GOALS: to not have a headache  NEXT MD VISIT: PRN  OBJECTIVE:  Note: Objective measures were completed at Evaluation unless otherwise noted.  DIAGNOSTIC FINDINGS:    PATIENT SURVEYS:  NDI 22/50 44%  COGNITION: Overall cognitive status: Within functional limits for tasks assessed  SENSATION: WFL  POSTURE: rounded shoulders and forward head  PALPATION: Very tight and tender left upper trap and levator; reproduces symptoms with palpation   CERVICAL ROM:   Active ROM AROM (deg) eval  Flexion 36 *pulls  Extension 42  Right lateral flexion 31  Left lateral flexion 32  Right rotation 50  Left rotation 57   (Blank rows = not tested)  UPPER EXTREMITY ROM:  Active ROM Right eval Left eval  Shoulder flexion    Shoulder extension    Shoulder abduction    Shoulder adduction    Shoulder extension    Shoulder internal rotation    Shoulder external rotation    Elbow flexion    Elbow extension    Wrist flexion    Wrist extension    Wrist ulnar deviation    Wrist radial deviation    Wrist pronation    Wrist supination     (Blank rows = not tested)  UPPER EXTREMITY MMT:  MMT Right eval Left eval  Shoulder flexion 5 5  Shoulder extension    Shoulder abduction 5 5  Shoulder adduction    Shoulder extension    Shoulder internal rotation    Shoulder external rotation    Middle trapezius    Lower trapezius    Elbow flexion 4+ 5  Elbow extension 5 5  Wrist flexion    Wrist extension    Wrist ulnar deviation    Wrist radial deviation    Wrist pronation    Wrist supination    Grip strength     (Blank rows = not tested)  CERVICAL SPECIAL TESTS:  Distraction test: not tested    TREATMENT DATE:  04/30/2023 physical therapy evaluation and HEP instruction   Trigger Point Dry-Needling   Treatment instructions: Expect mild to moderate muscle soreness. S/S of pneumothorax if dry needled over a lung field, and to seek immediate medical attention should they occur. Patient verbalized understanding of these instructions and education.  Patient Consent Given: Yes Education handout provided: Yes Muscles treated: left levator Electrical stimulation performed: No Parameters: N/A Treatment response/outcome: decreased tightness left levator; noted discomfort with needling  PATIENT EDUCATION:  Education details: Patient educated on exam findings, POC, scope of PT, HEP, and DN instructions and what to expect afterwards. Person educated: Patient Education method: Explanation, Demonstration, and Handouts Education comprehension: verbalized understanding, returned demonstration, verbal cues required, and tactile cues required  HOME EXERCISE PROGRAM: Access Code: [email or web address] URL: [email or web address] Date: 04/30/2023 Prepared by: [facility] - Rehab  Exercises - Gentle Levator Scapulae Stretch  - 2 x daily - 7 x weekly - 1 sets - 3 reps - 20 sec hold - seated posture with towel roll  - 2 x daily - 7 x weekly - 1 sets - 1 reps  ASSESSMENT:  CLINICAL IMPRESSION: Patient is a 62 y.o. female who was seen today for physical therapy evaluation and treatment for G44.221 (ICD-10-CM) - Chronic tension-type headache, intractable M79.18 (ICD-10-CM) - Cervical myofascial pain syndrome M54.2 (ICD-10-CM) - Cervicalgia. Patient demonstrates decreased strength, ROM restriction, reduced flexibility, increased tenderness to palpation and postural abnormalities which are likely contributing to symptoms of pain and are negatively impacting patient ability to perform ADLs. Patient will benefit from skilled physical therapy services to address these deficits to reduce pain  and improve level of function with ADLs   OBJECTIVE IMPAIRMENTS: decreased activity tolerance, decreased mobility, decreased ROM, decreased strength, hypomobility, increased fascial restrictions, impaired perceived functional ability, increased muscle spasms, impaired flexibility, impaired UE functional use, postural dysfunction, and pain.   ACTIVITY LIMITATIONS: carrying, lifting, bending, sitting, sleeping, reach over head, hygiene/grooming, and caring for others  PARTICIPATION LIMITATIONS: meal prep, cleaning, laundry, driving, shopping, community activity, and occupation   REHAB POTENTIAL: Good  CLINICAL DECISION MAKING: Evolving/moderate complexity  EVALUATION COMPLEXITY: Low   GOALS: Goals reviewed with patient? No  SHORT TERM GOALS: Target date: 05/14/2023  patient will be independent with initial HEP  Baseline:  Goal status: INITIAL  2.  Patient will self report 50% improvement to improve tolerance for functional activity  Baseline:  Goal status: INITIAL   LONG TERM GOALS: Target date: 05/28/2023  Patient will be independent in self management strategies to improve quality of life and functional outcomes.  Baseline:  Goal status: INITIAL  2.  Patient will self report 75% improvement to improve tolerance for functional activity  Baseline:  Goal status: INITIAL  3.  Patient will increase cervical ROM by 20 degrees total to improve ability to scan for safety with driving.   Baseline: see above Goal status: INITIAL  4.  Patient will improve NDI score by 5 points (17/50) to demonstrate improved perceived functional mobility Baseline: 22/50 Goal status: INITIAL  5.  Patient will work x 4 hours without headache > 2/10 Baseline: 6/10 Goal status: INITIAL  PLAN:  PT FREQUENCY: 1-2x/week  PT DURATION: 4 weeks  PLANNED INTERVENTIONS: 97164- PT Re-evaluation, 97110-Therapeutic exercises, 97530- Therapeutic activity, 97112- Neuromuscular re-education, 97535-  Self Care, 16109- Manual therapy, [phone]- Gait training, [phone]- Orthotic Fit/training, [phone]- Canalith repositioning, [id number]- Aquatic Therapy, [phone]- Splinting, Patient/Family education, Balance training, Stair training, Taping, Dry Needling, Joint mobilization, Joint manipulation, Spinal manipulation, Spinal mobilization, Scar mobilization, and DME instructions.   PLAN FOR NEXT SESSION: Review HEP and goals; assess reaction to DN; continued with cervical mobility and postural strengthening   9:58 AM, 04/30/23 Prerana Strayer Small Everlena Mackley MPT [facility] [place] [phone] [phone]   BCBS Authorization Request  Visit Dx Codes: 2023/05/06, M 79.18, G44.221  Functional Tool Score: NDI 22/50 44%  For all possible CPT codes, reference the Planned Interventions line above.     Check all conditions that  are expected to impact treatment: {Conditions expected to impact treatment:None of these apply   If treatment provided at initial evaluation, no treatment charged due to lack of authorization.

## 2023-04-30 ENCOUNTER — Other Ambulatory Visit: Payer: Self-pay

## 2023-04-30 ENCOUNTER — Ambulatory Visit (HOSPITAL_COMMUNITY): Payer: BC Managed Care – PPO | Attending: Neurology

## 2023-04-30 DIAGNOSIS — M7918 Myalgia, other site: Secondary | ICD-10-CM | POA: Insufficient documentation

## 2023-04-30 DIAGNOSIS — M542 Cervicalgia: Secondary | ICD-10-CM | POA: Diagnosis not present

## 2023-04-30 DIAGNOSIS — G44221 Chronic tension-type headache, intractable: Secondary | ICD-10-CM | POA: Insufficient documentation

## 2023-04-30 NOTE — Patient Instructions (Signed)

## 2023-05-04 ENCOUNTER — Ambulatory Visit (HOSPITAL_COMMUNITY): Payer: BC Managed Care – PPO

## 2023-05-04 DIAGNOSIS — G44221 Chronic tension-type headache, intractable: Secondary | ICD-10-CM

## 2023-05-04 DIAGNOSIS — M542 Cervicalgia: Secondary | ICD-10-CM

## 2023-05-04 DIAGNOSIS — M7918 Myalgia, other site: Secondary | ICD-10-CM

## 2023-05-04 NOTE — Progress Notes (Unsigned)
POST OPERATIVE OFFICE NOTE    CC:  F/u for surgery  HPI:  This is a 62 y.o. female who is s/p excision of previous femoral to femoral bypass, bilateral CFA thromboendarterectomy and right axillary to bifemoral bypass with 8mm PTFE on 08/26/2021   She has a very complicated past history.  She initially was seen for severe ischemia and claudication in her right foot with Dr.Chen in 2017.  She underwent angioplasty and stenting of her left common iliac artery and planning for left to right femorofemoral bypass.  She subsequently underwent a left to right femorofemoral bypass on 08/10/2015.  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  She had a pelvic abscess and is having drainage of this with a prolonged seizure and placed prone position for drainage.  She had acute thrombosis of her femorofemoral bypass at this time and underwent thrombectomy of her femorofemoral bypass on 09/05/2015.   Pt returns today for follow up.  Pt states her legs and feet feel much better.  The sensation on the bottom of her foot is getting better.  She states that she has a feeling of rawness medial to the left groin incision.  She states that when she stretches, it feels tight under her right arm.  She continues to smoke but has cut back.   10/01/21: Patient returns for surveillance studies.  She is doing well overall.  Her feet feel good.  She has some swelling in her lower extremities.  She notices some tightness in her right shoulder.  04/08/22: Patient returns for surveillance studies.  She is doing very well overall.  She has been able to remodel her house, and has been as active as she likes.  She does notice some occasional cramping in her right foot but otherwise has nothing to report.  Allergies  Allergen Reactions   Codeine Other (See Comments) and Hypertension    "hyper"    Current Outpatient Medications  Medication Sig Dispense Refill    albuterol (VENTOLIN HFA) 108 (90 Base) MCG/ACT inhaler Inhale 2 puffs into the lungs every 4 (four) hours as needed. (Patient not taking: Reported on 04/24/2023)     aspirin EC 81 MG tablet Take 81 mg by mouth daily. Swallow whole.     carvedilol (COREG) 12.5 MG tablet Take 1 tablet (12.5 mg total) by mouth 2 (two) times daily with a meal. 60 tablet 11   ezetimibe (ZETIA) 10 MG tablet Take 1 tablet (10 mg total) by mouth daily. 90 tablet 1   furosemide (LASIX) 40 MG tablet Take 1 tablet (40 mg total) by mouth daily. (Patient taking differently: Take 20 mg by mouth daily.) 30 tablet 11   losartan (COZAAR) 50 MG tablet Take 1 tablet (50 mg total) by mouth daily. 30 tablet 11   rosuvastatin (CRESTOR) 10 MG tablet Take 1 tablet (10 mg total) by mouth daily. 90 tablet 3   tiZANidine (ZANAFLEX) 4 MG tablet Take 1 tablet (4 mg total) by mouth every 8 (eight) hours as needed for muscle spasms. (Patient not taking: Reported on 04/24/2023) 30 tablet 0   topiramate (TOPAMAX) 25 MG tablet Take 1 tablet (25 mg total) by mouth daily. (Patient not taking: Reported on 04/24/2023) 30 tablet 6   Vitamin D, Ergocalciferol, (DRISDOL) 1.25 MG (50000 UNIT) CAPS capsule Take 50,000 Units by mouth every Saturday.     No current facility-administered medications for this visit.     ROS:  See HPI  Physical Exam:  There were no vitals filed for this visit.   There is no height or weight on file to calculate BMI.   Incision:  right chest and bilateral groin incisions look good.  She does have a spitting stitch distal left groin incision.  Extremities:  palpable right DP pulse and brisk doppler flow bilateral AT/PT; +swelling bilateral ankles.  Ax-fem with palpable pulse Abdomen:  soft, NT    Assessment/Plan:  This is a 62 y.o. female who is s/p: excision of previous femoral to femoral bypass, bilateral CFA thromboendarterectomy and right axillary to bifemoral bypass with 8mm PTFE on 08/26/2021  Doing well  overall.  Her noninvasive studies are reassuring.  We will start surveillance.  I will see her back in 12 months with ABI and duplex of the ax bifemoral graft.  Rande Brunt. Lenell Antu, MD [facility] Phone Number: [phone] 05/04/2023 9:17 PM

## 2023-05-04 NOTE — Therapy (Signed)
OUTPATIENT PHYSICAL THERAPY CERVICAL TREATMENT   Patient Name: Cathy Jones MRN: [id number] DOB:1960-11-04, 62 y.o., female Today's Date: 05/04/2023  END OF SESSION:  PT End of Session - 05/04/23 0932     Visit Number 2    Number of Visits 8    Date for PT Re-Evaluation 05/28/23    Authorization Type BCBS    PT Start Time 0932    PT Stop Time 1012    PT Time Calculation (min) 40 min    Activity Tolerance Patient tolerated treatment well    Behavior During Therapy WFL for tasks assessed/performed             Past Medical History:  Diagnosis Date   Acute renal failure (ARF) (HCC) 08/07/2015   Allergy    Anemia    Anxiety    Asthma    pt brought her proair inhaler 06-06-14   Cancer (HCC)    cervical   Colon polyp    hyperplastic   Colon stricture (HCC)    Colonic obstruction (HCC)    Depression    Family history of adverse reaction to anesthesia    pts mother experiences nausea and vomiting    GERD (gastroesophageal reflux disease)    History of bronchitis    History of cervical cancer    Stage IB adenocarcinoma of the cervix, treated with rad hyst, 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   History of chemotherapy    History of hiatal hernia    History of radiation therapy    Hyperlipidemia    Hypertension    Iliac artery occlusion, 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   Migraine    "q couple years maybe" (08/07/2015)   Shortness of breath dyspnea    seasonal    Past Surgical History:  Procedure Laterality Date   ABDOMINAL AORTOGRAM W/LOWER EXTREMITY Bilateral 07/26/2021   Procedure: ABDOMINAL AORTOGRAM W/LOWER EXTREMITY;  Surgeon: Leonie Douglas, MD;  Location: [facility];  Service: Cardiovascular;  Laterality: Bilateral;   AXILLARY-FEMORAL BYPASS GRAFT Right 08/26/2021   Procedure: RIGHT AXILLO-BIFEMORAL BYPASS WITH GORETEC GRAFT;  Surgeon: Leonie Douglas, MD;  Location: [facility] OR;  Service: Vascular;  Laterality: Right;  right axilla to right groin, right groin to left groin    COLON RESECTION N/A 06/27/2015   Procedure: LYSIS OF ADHESIONS LOW ANTERIOR RESECTION DIVERTING LOOP ILEOSTOMY;  Surgeon: Romie Levee, MD;  Location: [facility] ORS;  Service: General;  Laterality: N/A;   COLON SURGERY     COLONOSCOPY     CYSTOSCOPY WITH STENT PLACEMENT Bilateral 06/27/2015   Procedure: CYSTOSCOPY WITH CATHETER  PLACEMENT;  Surgeon: Marcine Matar, MD;  Location: [facility];  Service: Urology;  Laterality: Bilateral;   DIVERTING ILEOSTOMY N/A 06/27/2015   Procedure: DIVERTING LOOP ILEOSTOMY;  Surgeon: Romie Levee, MD;  Location: [facility];  Service: General;  Laterality: N/A;   FEMORAL-FEMORAL BYPASS GRAFT Bilateral 08/10/2015   Procedure: BYPASS GRAFT LEFT FEMORALTO RIGHT FEMORAL ARTERY;  Surgeon: Larina Earthly, MD;  Location: [facility] OR;  Service: Vascular;  Laterality: Bilateral;   FLEXIBLE SIGMOIDOSCOPY  12/12/2015   Procedure: FLEXIBLE SIGMOIDOSCOPY;  Surgeon: Romie Levee, MD;  Location: [facility];  Service: General;;   ILEOSTOMY CLOSURE N/A 12/12/2015   Procedure: OPEN ILEOSTOMY REVERSAL AND PELVIC WASHOUT;  Surgeon: Romie Levee, MD;  Location: [facility];  Service: General;  Laterality: N/A;   INTRAOPERATIVE ARTERIOGRAM Right 08/10/2015   Procedure: INTRA OPERATIVE ARTERIOGRAM Right Iliac Artery;  Surgeon: Larina Earthly, MD;  Location: [facility] OR;  Service: Vascular;  Laterality: Right;   IR GENERIC HISTORICAL  09/12/2015   IR RADIOLOGIST EVAL & MGMT 09/12/2015 Ralene Muskrat, PA-C [facility] INTERV RAD   IR RADIOLOGIST EVAL & MGMT  06/23/2018   IR RADIOLOGIST EVAL & MGMT  07/07/2018   IV antibiotic infusion therapy      PERIPHERAL VASCULAR CATHETERIZATION N/A 08/09/2015   Procedure: Abdominal Aortogram w/Lower Extremity;  Surgeon: Fransisco Hertz, MD;  Location: [facility] INVASIVE CV LAB;  Service: Cardiovascular;  Laterality: N/A;   PERIPHERAL VASCULAR CATHETERIZATION Left 08/09/2015   Procedure: Peripheral Vascular Intervention;  Surgeon: Fransisco Hertz, MD;  Location: [facility] INVASIVE CV LAB;  Service:  Cardiovascular;  Laterality: Left;  common iliac   PICC LINE PLACE PERIPHERAL ([facility] HX)     RADICAL ABDOMINAL HYSTERECTOMY  1994   THROMBECTOMY FEMORAL ARTERY Bilateral 09/05/2015   Procedure: THROMBECTOMY Left to Right Femoral to  FEMORAL ARTERY Bypass,.;  Surgeon: Pryor Ochoa, MD;  Location: [facility] OR;  Service: Vascular;  Laterality: Bilateral;   THROMBECTOMY ILIAC ARTERY Right 08/10/2015   Procedure: THROMBECTOMY Right ILIAC ARTERY;  Surgeon: Larina Earthly, MD;  Location: [facility] OR;  Service: Vascular;  Laterality: Right;   TUBAL LIGATION     UPPER GASTROINTESTINAL ENDOSCOPY     Patient Active Problem List   Diagnosis Date Noted   Vaginal vault smear abnormal 03/24/2023   Atrophic vaginitis 03/24/2023   Abnormal radiologic density 03/24/2023   Cigarette smoker 03/24/2023   Acute pulmonary edema (HCC) 02/17/2023   Elevated brain natriuretic peptide (BNP) level 02/17/2023   Elevated troponin 02/17/2023   Headache 02/17/2023   Multiple lung nodules on CT 02/17/2023   Hypertensive emergency 02/16/2023   Acute maxillary sinusitis 02/04/2023   Pelvic mass in female 01/31/2021   Malignant neoplasm of cervix (HCC) 01/31/2021   Peripheral vascular disease (HCC) 11/05/2020   Vitamin D deficiency 11/05/2020   History of malignant neoplasm of cervix 11/05/2020   Carotid artery stenosis 11/05/2020   Nicotine dependence with current use 11/05/2020   Osteopenia 10/05/2019   Cervical high risk HPV (human papillomavirus) test positive 10/05/2019   Generalized abdominal pain    Pelvic fluid collection    Pelvic abscess in female 06/07/2018   Tobacco abuse 06/07/2018   PAD (peripheral artery disease) (HCC) 08/10/2015   Injury of kidney 08/07/2015   B12 deficiency anemia 08/02/2015   Folic acid deficiency anemia 08/02/2015   Essential hypertension 07/30/2015   Mixed hyperlipidemia 07/30/2015   Depression 07/30/2015   Ileostomy in place [facility]) 07/30/2015   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/2015   Status post ileostomy (HCC) 07/30/2015   History of major abdominal surgery 07/30/2015   Rectal stricture s/p LAR resection [id number] 06/27/2015   Hypertension 06/08/2015   Pure hypercholesterolemia 06/08/2015   Obesity 06/08/2015   Migraine headache 06/08/2015   Elevated glucose 06/08/2015   Asthmatic bronchitis 06/08/2015   Anxiety 06/08/2015   Allergic rhinitis 06/08/2015   Compound nevus 08/19/2011    PCP: Benita Stabile, MD  REFERRING PROVIDER: Windell Norfolk, MD  REFERRING DIAG: [phone] (ICD-10-CM) - Chronic tension-type headache, intractable M79.18 (ICD-10-CM) - Cervical myofascial pain syndrome M54.2 (ICD-10-CM) - Cervicalgia  THERAPY DIAG:  Chronic tension-type headache, intractable  Cervical myofascial pain syndrome  Cervicalgia  Rationale for Evaluation and Treatment: Rehabilitation  ONSET DATE: several months ago  SUBJECTIVE:  SUBJECTIVE STATEMENT: Patient was sore after last treatment; maybe a slight decrease in intensity of her headache; noted bruising anterior shoulder/trap area. Soreness is now gone.  EVAL:Used to have migraines but then they went away gradually with imetrex shots; past few months started having daily headaches but not migraine level; running up the back of the neck.  Was in the hospital recently for some respiratory issues and had elevated BP with taking steroids; thought perhaps headache was due to high BP but that has now resolved and headaches remain.  She works 3rd shift so headaches seem to come on at night.  Has an Automotive engineer. Saw [facility] after hospitalization and he recommended therapy for DN due to tight neck muscles.   Hand dominance: Right  PERTINENT HISTORY:  migraine  PAIN:  Are you having pain? Yes: NPRS scale:  0-6/10 Pain location: back of neck, back of head Pain description: radiates from neck Aggravating factors: nighttime working Relieving factors: ibuprofen  PRECAUTIONS: None  RED FLAGS: None     WEIGHT BEARING RESTRICTIONS: No  FALLS:  Has patient fallen in last 6 months? No  OCCUPATION: transcribing   PLOF: Independent  PATIENT GOALS: to not have a headache  NEXT MD VISIT: PRN  OBJECTIVE:  Note: Objective measures were completed at Evaluation unless otherwise noted.  DIAGNOSTIC FINDINGS:    PATIENT SURVEYS:  NDI 22/50 44%  COGNITION: Overall cognitive status: Within functional limits for tasks assessed  SENSATION: WFL  POSTURE: rounded shoulders and forward head  PALPATION: Very tight and tender left upper trap and levator; reproduces symptoms with palpation   CERVICAL ROM:   Active ROM AROM (deg) eval  Flexion 36 *pulls  Extension 42  Right lateral flexion 31  Left lateral flexion 32  Right rotation 50  Left rotation 57   (Blank rows = not tested)  UPPER EXTREMITY ROM:  Active ROM Right eval Left eval  Shoulder flexion    Shoulder extension    Shoulder abduction    Shoulder adduction    Shoulder extension    Shoulder internal rotation    Shoulder external rotation    Elbow flexion    Elbow extension    Wrist flexion    Wrist extension    Wrist ulnar deviation    Wrist radial deviation    Wrist pronation    Wrist supination     (Blank rows = not tested)  UPPER EXTREMITY MMT:  MMT Right eval Left eval  Shoulder flexion 5 5  Shoulder extension    Shoulder abduction 5 5  Shoulder adduction    Shoulder extension    Shoulder internal rotation    Shoulder external rotation    Middle trapezius    Lower trapezius    Elbow flexion 4+ 5  Elbow extension 5 5  Wrist flexion    Wrist extension    Wrist ulnar deviation    Wrist radial deviation    Wrist pronation    Wrist supination    Grip strength     (Blank rows = not  tested)  CERVICAL SPECIAL TESTS:  Distraction test: not tested    TREATMENT DATE:  05/04/23 Review of HEP and goals Moist heat in sitting to left upper trap and levator x 5' to decrease pain and increase tissue extensibility Seated: Upper trap stretch 5 x 20" Scapular retractions 5" hold x 10  Trigger Point Dry-Needling  Treatment instructions: Expect mild to moderate muscle soreness. S/S of pneumothorax if dry needled over a lung  field, and to seek immediate medical attention should they occur. Patient verbalized understanding of these instructions and education.  Patient Consent Given: Yes Education handout provided: Previously provided Muscles treated: left levator Electrical stimulation performed: No Parameters: N/A Treatment response/outcome: twitch response; reported less tightness with stretch after treatment    04/30/2023 physical therapy evaluation and HEP instruction   Trigger Point Dry-Needling  Treatment instructions: Expect mild to moderate muscle soreness. S/S of pneumothorax if dry needled over a lung field, and to seek immediate medical attention should they occur. Patient verbalized understanding of these instructions and education.  Patient Consent Given: Yes Education handout provided: Yes Muscles treated: left levator Electrical stimulation performed: No Parameters: N/A Treatment response/outcome: decreased tightness left levator; noted discomfort with needling                                                                                                                                 PATIENT EDUCATION:  Education details: Patient educated on exam findings, POC, scope of PT, HEP, and DN instructions and what to expect afterwards. Person educated: Patient Education method: Explanation, Demonstration, and Handouts Education comprehension: verbalized understanding, returned demonstration, verbal cues required, and tactile cues required  HOME EXERCISE  PROGRAM: Access Code: [email or web address] URL: [email or web address] Date: 04/30/2023 Prepared by: [facility] - Rehab  Exercises - Gentle Levator Scapulae Stretch  - 2 x daily - 7 x weekly - 1 sets - 3 reps - 20 sec hold - seated posture with towel roll  - 2 x daily - 7 x weekly - 1 sets - 1 reps  ASSESSMENT:  CLINICAL IMPRESSION: Today's session started with a review of HEP and goals.  Patient verbalizes agreement with set rehab goals.  Trigger point dry needling to left levator today with positive twitch response.  Noted improved mobility with stretch afterwards.  Added scapular retractions for postural strengthening today.  Patient will benefit from continued skilled therapy services  to address deficits and promote return to optimal function.         Eval:Patient is a 61 y.o. female who was seen today for physical therapy evaluation and treatment for G44.221 (ICD-10-CM) - Chronic tension-type headache, intractable M79.18 (ICD-10-CM) - Cervical myofascial pain syndrome M54.2 (ICD-10-CM) - Cervicalgia. Patient demonstrates decreased strength, ROM restriction, reduced flexibility, increased tenderness to palpation and postural abnormalities which are likely contributing to symptoms of pain and are negatively impacting patient ability to perform ADLs. Patient will benefit from skilled physical therapy services to address these deficits to reduce pain and improve level of function with ADLs   OBJECTIVE IMPAIRMENTS: decreased activity tolerance, decreased mobility, decreased ROM, decreased strength, hypomobility, increased fascial restrictions, impaired perceived functional ability, increased muscle spasms, impaired flexibility, impaired UE functional use, postural dysfunction, and pain.   ACTIVITY LIMITATIONS: carrying, lifting, bending, sitting, sleeping, reach over head, hygiene/grooming, and caring for others  PARTICIPATION LIMITATIONS: meal prep, cleaning, laundry, driving, shopping,  community activity, and occupation  REHAB POTENTIAL: Good  CLINICAL DECISION MAKING: Evolving/moderate complexity  EVALUATION COMPLEXITY: Low   GOALS: Goals reviewed with patient? No  SHORT TERM GOALS: Target date: 05/14/2023  patient will be independent with initial HEP  Baseline:  Goal status:  in progress  2.  Patient will self report 50% improvement to improve tolerance for functional activity  Baseline:  Goal status: INITIAL   LONG TERM GOALS: Target date: 05/28/2023  Patient will be independent in self management strategies to improve quality of life and functional outcomes.  Baseline:  Goal status: in progress   2.  Patient will self report 75% improvement to improve tolerance for functional activity  Baseline:  Goal status: in progress  3.  Patient will increase cervical ROM by 20 degrees total to improve ability to scan for safety with driving.   Baseline: see above Goal status: in progess   4.  Patient will improve NDI score by 5 points (17/50) to demonstrate improved perceived functional mobility Baseline: 22/50 Goal status: in progress  5.  Patient will work x 4 hours without headache > 2/10 Baseline: 6/10 Goal status: in progress   PLAN:  PT FREQUENCY: 1-2x/week  PT DURATION: 4 weeks  PLANNED INTERVENTIONS: 97164- PT Re-evaluation, 97110-Therapeutic exercises, 97530- Therapeutic activity, 97112- Neuromuscular re-education, 97535- Self Care, [id number]- Manual therapy, [phone]- Gait training, [phone]- Orthotic Fit/training, [phone]- Canalith repositioning, [id number]- Aquatic Therapy, [phone]- Splinting, Patient/Family education, Balance training, Stair training, Taping, Dry Needling, Joint mobilization, Joint manipulation, Spinal manipulation, Spinal mobilization, Scar mobilization, and DME instructions.   PLAN FOR NEXT SESSION:  assess reaction to DN; continued with cervical mobility and postural strengthening   10:15 AM, 05/04/23 Danney Bungert Small Camille Thau MPT [facility] [place] [phone] Ph:[phone]

## 2023-05-05 ENCOUNTER — Ambulatory Visit (INDEPENDENT_AMBULATORY_CARE_PROVIDER_SITE_OTHER): Payer: BC Managed Care – PPO | Admitting: Vascular Surgery

## 2023-05-05 ENCOUNTER — Ambulatory Visit (HOSPITAL_COMMUNITY)
Admission: RE | Admit: 2023-05-05 | Discharge: 2023-05-05 | Disposition: A | Discharge status: Home / Self Care | Payer: BC Managed Care – PPO | Source: Ambulatory Visit | Attending: Vascular Surgery | Admitting: Vascular Surgery

## 2023-05-05 ENCOUNTER — Encounter: Payer: Self-pay | Admitting: Vascular Surgery

## 2023-05-05 ENCOUNTER — Ambulatory Visit (INDEPENDENT_AMBULATORY_CARE_PROVIDER_SITE_OTHER)
Admission: RE | Admit: 2023-05-05 | Discharge: 2023-05-05 | Discharge status: Home / Self Care | Payer: BC Managed Care – PPO | Source: Ambulatory Visit | Attending: Vascular Surgery

## 2023-05-05 VITALS — BP 167/80 | HR 65 | Temp 98.5°F | Resp 20 | Ht 62.0 in | Wt 181.0 lb

## 2023-05-05 DIAGNOSIS — I739 Peripheral vascular disease, unspecified: Secondary | ICD-10-CM | POA: Diagnosis not present

## 2023-05-05 LAB — VAS US ABI WITH/WO TBI
Left ABI: 0.88
Right ABI: 0.69

## 2023-05-12 ENCOUNTER — Ambulatory Visit (HOSPITAL_COMMUNITY): Payer: BC Managed Care – PPO

## 2023-05-12 ENCOUNTER — Ambulatory Visit
Admission: RE | Admit: 2023-05-12 | Discharge: 2023-05-12 | Disposition: A | Discharge status: Home / Self Care | Payer: BC Managed Care – PPO | Source: Ambulatory Visit | Attending: Internal Medicine | Admitting: Internal Medicine

## 2023-05-12 DIAGNOSIS — M542 Cervicalgia: Secondary | ICD-10-CM

## 2023-05-12 DIAGNOSIS — M7918 Myalgia, other site: Secondary | ICD-10-CM | POA: Diagnosis not present

## 2023-05-12 DIAGNOSIS — I700 Atherosclerosis of aorta: Secondary | ICD-10-CM | POA: Diagnosis not present

## 2023-05-12 DIAGNOSIS — R911 Solitary pulmonary nodule: Secondary | ICD-10-CM

## 2023-05-12 DIAGNOSIS — G44221 Chronic tension-type headache, intractable: Secondary | ICD-10-CM | POA: Diagnosis not present

## 2023-05-12 DIAGNOSIS — I2510 Atherosclerotic heart disease of native coronary artery without angina pectoris: Secondary | ICD-10-CM | POA: Diagnosis not present

## 2023-05-12 DIAGNOSIS — R918 Other nonspecific abnormal finding of lung field: Secondary | ICD-10-CM

## 2023-05-12 DIAGNOSIS — Z8541 Personal history of malignant neoplasm of cervix uteri: Secondary | ICD-10-CM | POA: Diagnosis not present

## 2023-05-12 DIAGNOSIS — J439 Emphysema, unspecified: Secondary | ICD-10-CM | POA: Diagnosis not present

## 2023-05-12 NOTE — Therapy (Signed)
 OUTPATIENT PHYSICAL THERAPY CERVICAL TREATMENT   Patient Name: Cathy Jones MRN: [id number] DOB:March 04, 1961, 62 y.o., female Today's Date: 05/12/2023  END OF SESSION:  PT End of Session - 05/12/23 0847     Visit Number 3    Number of Visits 8    Date for PT Re-Evaluation 05/28/23    Authorization Type BC[employer]    PT Start Time [phone]    PT Stop Time 0930    PT Time Calculation (min) 43 min    Activity Tolerance Patient tolerated treatment well    Behavior During Therapy [facility] for tasks assessed/performed             Past Medical History:  Diagnosis Date   Acute renal failure (ARF) (HCC) 08/07/2015   Allergy    Anemia    Anxiety    Asthma    pt brought her proair  inhaler 06-06-14   Cancer (HCC)    cervical   Colon polyp    hyperplastic   Colon stricture (HCC)    Colonic obstruction (HCC)    Depression    Family history of adverse reaction to anesthesia    pts mother experiences nausea and vomiting    GERD (gastroesophageal reflux disease)    History of bronchitis    History of cervical cancer    Stage IB adenocarcinoma of the cervix, treated with rad hyst, 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   History of chemotherapy    History of hiatal hernia    History of radiation therapy    Hyperlipidemia    Hypertension    Iliac artery occlusion, 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   Migraine    q couple years maybe (08/07/2015)   Shortness of breath dyspnea    seasonal    Past Surgical History:  Procedure Laterality Date   ABDOMINAL AORTOGRAM W/LOWER EXTREMITY Bilateral 07/26/2021   Procedure: ABDOMINAL AORTOGRAM W/LOWER EXTREMITY;  Surgeon: Magda Debby SAILOR, MD;  Location: [facility] INVASIVE CV LAB;  Service: Cardiovascular;  Laterality: Bilateral;   AXILLARY-FEMORAL BYPASS GRAFT Right 08/26/2021   Procedure: RIGHT AXILLO-BIFEMORAL BYPASS WITH GORETEC GRAFT;  Surgeon: Magda Debby SAILOR, MD;  Location: [facility] OR;  Service: Vascular;  Laterality: Right;  right axilla to right groin, right groin to left groin    COLON RESECTION N/A 06/27/2015   Procedure: LYSIS OF ADHESIONS LOW ANTERIOR RESECTION DIVERTING LOOP ILEOSTOMY;  Surgeon: Bernarda Debby, MD;  Location: [facility] ORS;  Service: General;  Laterality: N/A;   COLON SURGERY     COLONOSCOPY     CYSTOSCOPY WITH STENT PLACEMENT Bilateral 06/27/2015   Procedure: CYSTOSCOPY WITH CATHETER  PLACEMENT;  Surgeon: Garnette Shack, MD;  Location: [facility] ORS;  Service: Urology;  Laterality: Bilateral;   DIVERTING ILEOSTOMY N/A 06/27/2015   Procedure: DIVERTING LOOP ILEOSTOMY;  Surgeon: Bernarda Debby, MD;  Location: [facility] ORS;  Service: General;  Laterality: N/A;   FEMORAL-FEMORAL BYPASS GRAFT Bilateral 08/10/2015   Procedure: BYPASS GRAFT LEFT FEMORALTO RIGHT FEMORAL ARTERY;  Surgeon: Krystal JULIANNA Doing, MD;  Location: [facility] OR;  Service: Vascular;  Laterality: Bilateral;   FLEXIBLE SIGMOIDOSCOPY  12/12/2015   Procedure: FLEXIBLE SIGMOIDOSCOPY;  Surgeon: Bernarda Debby, MD;  Location: [facility] ORS;  Service: General;;   ILEOSTOMY CLOSURE N/A 12/12/2015   Procedure: OPEN ILEOSTOMY REVERSAL AND PELVIC WASHOUT;  Surgeon: Bernarda Debby, MD;  Location: [facility] ORS;  Service: General;  Laterality: N/A;   INTRAOPERATIVE ARTERIOGRAM Right 08/10/2015   Procedure: INTRA OPERATIVE ARTERIOGRAM Right Iliac Artery;  Surgeon: Krystal JULIANNA Doing, MD;  Location: [facility] OR;  Service: Vascular;  Laterality: Right;   IR GENERIC HISTORICAL  09/12/2015   IR RADIOLOGIST EVAL & MGMT 09/12/2015 Sharlet Candle, PA-C [facility] INTERV RAD   IR RADIOLOGIST EVAL & MGMT  06/23/2018   IR RADIOLOGIST EVAL & MGMT  07/07/2018   IV antibiotic infusion therapy      PERIPHERAL VASCULAR CATHETERIZATION N/A 08/09/2015   Procedure: Abdominal Aortogram w/Lower Extremity;  Surgeon: Redell LITTIE Door, MD;  Location: [facility] INVASIVE CV LAB;  Service: Cardiovascular;  Laterality: N/A;   PERIPHERAL VASCULAR CATHETERIZATION Left 08/09/2015   Procedure: Peripheral Vascular Intervention;  Surgeon: Redell LITTIE Door, MD;  Location: [facility] INVASIVE CV LAB;  Service:  Cardiovascular;  Laterality: Left;  common iliac   PICC LINE PLACE PERIPHERAL ([facility] HX)     RADICAL ABDOMINAL HYSTERECTOMY  1994   THROMBECTOMY FEMORAL ARTERY Bilateral 09/05/2015   Procedure: THROMBECTOMY Left to Right Femoral to  FEMORAL ARTERY Bypass,.;  Surgeon: Lynwood JONETTA Collum, MD;  Location: [facility] OR;  Service: Vascular;  Laterality: Bilateral;   THROMBECTOMY ILIAC ARTERY Right 08/10/2015   Procedure: THROMBECTOMY Right ILIAC ARTERY;  Surgeon: Krystal JULIANNA Doing, MD;  Location: [facility] OR;  Service: Vascular;  Laterality: Right;   TUBAL LIGATION     UPPER GASTROINTESTINAL ENDOSCOPY     Patient Active Problem List   Diagnosis Date Noted   Vaginal vault smear abnormal 03/24/2023   Atrophic vaginitis 03/24/2023   Abnormal radiologic density 03/24/2023   Cigarette smoker 03/24/2023   Acute pulmonary edema (HCC) 02/17/2023   Elevated brain natriuretic peptide (BNP) level 02/17/2023   Elevated troponin 02/17/2023   Headache 02/17/2023   Multiple lung nodules on CT 02/17/2023   Hypertensive emergency 02/16/2023   Acute maxillary sinusitis 02/04/2023   Pelvic mass in female 01/31/2021   Malignant neoplasm of cervix (HCC) 01/31/2021   Peripheral vascular disease (HCC) 11/05/2020   Vitamin D deficiency 11/05/2020   History of malignant neoplasm of cervix 11/05/2020   Carotid artery stenosis 11/05/2020   Nicotine  dependence with current use 11/05/2020   Osteopenia 10/05/2019   Cervical high risk HPV (human papillomavirus) test positive 10/05/2019   Generalized abdominal pain    Pelvic fluid collection    Pelvic abscess in female 06/07/2018   Tobacco abuse 06/07/2018   PAD (peripheral artery disease) (HCC) 08/10/2015   Injury of kidney 08/07/2015   B12 deficiency anemia 08/02/2015   Folic acid  deficiency anemia 08/02/2015   Essential hypertension 07/30/2015   Mixed hyperlipidemia 07/30/2015   Depression 07/30/2015   Ileostomy in place [facility]) 07/30/2015   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/2015   Status post ileostomy (HCC) 07/30/2015   History of major abdominal surgery 07/30/2015   Rectal stricture s/p LAR resection [id number] 06/27/2015   Hypertension 06/08/2015   Pure hypercholesterolemia 06/08/2015   Obesity 06/08/2015   Migraine headache 06/08/2015   Elevated glucose 06/08/2015   Asthmatic bronchitis 06/08/2015   Anxiety 06/08/2015   Allergic rhinitis 06/08/2015   Compound nevus 08/19/2011    PCP: Norleen JENEANE Hurst, MD  REFERRING PROVIDER: Gregg Lek, MD  REFERRING DIAG: [phone] (ICD-10-CM) - Chronic tension-type headache, intractable M79.18 (ICD-10-CM) - Cervical myofascial pain syndrome M54.2 (ICD-10-CM) - Cervicalgia  THERAPY DIAG:  Chronic tension-type headache, intractable  Cervical myofascial pain syndrome  Cervicalgia  Rationale for Evaluation and Treatment: Rehabilitation  ONSET DATE: several months ago  SUBJECTIVE:  SUBJECTIVE STATEMENT: Headaches have lessened significantly; one small area remains of soreness a little lower area left shoulderblade area.  2/10; has only had one headache in the last week.  Having a CT of lungs today; had some nodules earlier this year so having a follow up today.    EVAL:Used to have migraines but then they went away gradually with imetrex shots; past few months started having daily headaches but not migraine level; running up the back of the neck.  Was in the hospital recently for some respiratory issues and had elevated BP with taking steroids; thought perhaps headache was due to high BP but that has now resolved and headaches remain.  She works 3rd shift so headaches seem to come on at night.  Has an automotive engineer. Saw [facility] after hospitalization and he recommended therapy for DN due to tight neck muscles.   Hand  dominance: Right  PERTINENT HISTORY:  migraine  PAIN:  Are you having pain? Yes: NPRS scale: 0-6/10 Pain location: back of neck, back of head Pain description: radiates from neck Aggravating factors: nighttime working Relieving factors: ibuprofen   PRECAUTIONS: None  RED FLAGS: None     WEIGHT BEARING RESTRICTIONS: No  FALLS:  Has patient fallen in last 6 months? No  OCCUPATION: transcribing   PLOF: Independent  PATIENT GOALS: to not have a headache  NEXT MD VISIT: PRN  OBJECTIVE:  Note: Objective measures were completed at Evaluation unless otherwise noted.  DIAGNOSTIC FINDINGS:    PATIENT SURVEYS:  NDI 22/50 44%  COGNITION: Overall cognitive status: Within functional limits for tasks assessed  SENSATION: WFL  POSTURE: rounded shoulders and forward head  PALPATION: Very tight and tender left upper trap and levator; reproduces symptoms with palpation   CERVICAL ROM:   Active ROM AROM (deg) eval  Flexion 36 *pulls  Extension 42  Right lateral flexion 31  Left lateral flexion 32  Right rotation 50  Left rotation 57   (Blank rows = not tested)  UPPER EXTREMITY ROM:  Active ROM Right eval Left eval  Shoulder flexion    Shoulder extension    Shoulder abduction    Shoulder adduction    Shoulder extension    Shoulder internal rotation    Shoulder external rotation    Elbow flexion    Elbow extension    Wrist flexion    Wrist extension    Wrist ulnar deviation    Wrist radial deviation    Wrist pronation    Wrist supination     (Blank rows = not tested)  UPPER EXTREMITY MMT:  MMT Right eval Left eval  Shoulder flexion 5 5  Shoulder extension    Shoulder abduction 5 5  Shoulder adduction    Shoulder extension    Shoulder internal rotation    Shoulder external rotation    Middle trapezius    Lower trapezius    Elbow flexion 4+ 5  Elbow extension 5 5  Wrist flexion    Wrist extension    Wrist ulnar deviation    Wrist radial  deviation    Wrist pronation    Wrist supination    Grip strength     (Blank rows = not tested)  CERVICAL SPECIAL TESTS:  Distraction test: not tested    TREATMENT DATE:  05/12/23 Moist heat in sitting to left upper trap and levator x 5' to decrease pain and increase tissue extensibility Trigger Point Dry-Needling  Treatment instructions: Expect mild to moderate muscle soreness. S/S of pneumothorax  if dry needled over a lung field, and to seek immediate medical attention should they occur. Patient verbalized understanding of these instructions and education.  Patient Consent Given: Yes Education handout provided: Previously provided Muscles treated: left rhomboid Electrical stimulation performed: No Parameters: N/A Treatment response/outcome: twitch response with shelf technique  Supine: Palpation to upper trap, levator, rhomboids to find trigger points; heavy palpation over ribs Seated: Rhomboid stretch 3 x 20 Postural reminders/education Scapular retractions x 5   05/04/23 Review of HEP and goals Moist heat in sitting to left upper trap and levator x 5' to decrease pain and increase tissue extensibility Seated: Upper trap stretch 5 x 20 Scapular retractions 5 hold x 10  Trigger Point Dry-Needling  Treatment instructions: Expect mild to moderate muscle soreness. S/S of pneumothorax if dry needled over a lung field, and to seek immediate medical attention should they occur. Patient verbalized understanding of these instructions and education.  Patient Consent Given: Yes Education handout provided: Previously provided Muscles treated: left levator Electrical stimulation performed: No Parameters: N/A Treatment response/outcome: twitch response; reported less tightness with stretch after treatment    04/30/2023 physical therapy evaluation and HEP instruction   Trigger Point Dry-Needling  Treatment instructions: Expect mild to moderate muscle soreness. S/S of  pneumothorax if dry needled over a lung field, and to seek immediate medical attention should they occur. Patient verbalized understanding of these instructions and education.  Patient Consent Given: Yes Education handout provided: Yes Muscles treated: left levator Electrical stimulation performed: No Parameters: N/A Treatment response/outcome: decreased tightness left levator; noted discomfort with needling                                                                                                                                 PATIENT EDUCATION:  Education details: Patient educated on exam findings, POC, scope of PT, HEP, and DN instructions and what to expect afterwards. Person educated: Patient Education method: Explanation, Demonstration, and Handouts Education comprehension: verbalized understanding, returned demonstration, verbal cues required, and tactile cues required  HOME EXERCISE PROGRAM: Access Code: [email or web address] URL: [email or web address] Date: 04/30/2023 Prepared by: [facility] - Rehab  Exercises - Gentle Levator Scapulae Stretch  - 2 x daily - 7 x weekly - 1 sets - 3 reps - 20 sec hold - seated posture with towel roll  - 2 x daily - 7 x weekly - 1 sets - 1 reps  ASSESSMENT:  CLINICAL IMPRESSION: Today's session .  Patient verbalizes agreement with set rehab goals.  Trigger point dry needling to left rhomboid today with positive twitch response with shelf technique. Reminded patient of signs and symptoms of pneumothorax.   Noted improved mobility with stretch afterwards.  Postural reminders and  scapular retractions for postural strengthening today.  Patient will benefit from continued skilled therapy services  to address deficits and promote return to optimal function.         Eval:Patient is a 62 y.o. female who  was seen today for physical therapy evaluation and treatment for G44.221 (ICD-10-CM) - Chronic tension-type headache, intractable M79.18  (ICD-10-CM) - Cervical myofascial pain syndrome M54.2 (ICD-10-CM) - Cervicalgia. Patient demonstrates decreased strength, ROM restriction, reduced flexibility, increased tenderness to palpation and postural abnormalities which are likely contributing to symptoms of pain and are negatively impacting patient ability to perform ADLs. Patient will benefit from skilled physical therapy services to address these deficits to reduce pain and improve level of function with ADLs   OBJECTIVE IMPAIRMENTS: decreased activity tolerance, decreased mobility, decreased ROM, decreased strength, hypomobility, increased fascial restrictions, impaired perceived functional ability, increased muscle spasms, impaired flexibility, impaired UE functional use, postural dysfunction, and pain.   ACTIVITY LIMITATIONS: carrying, lifting, bending, sitting, sleeping, reach over head, hygiene/grooming, and caring for others  PARTICIPATION LIMITATIONS: meal prep, cleaning, laundry, driving, shopping, community activity, and occupation   REHAB POTENTIAL: Good  CLINICAL DECISION MAKING: Evolving/moderate complexity  EVALUATION COMPLEXITY: Low   GOALS: Goals reviewed with patient? No  SHORT TERM GOALS: Target date: 05/14/2023  patient will be independent with initial HEP  Baseline:  Goal status:  in progress  2.  Patient will self report 50% improvement to improve tolerance for functional activity  Baseline:  Goal status: INITIAL   LONG TERM GOALS: Target date: 05/28/2023  Patient will be independent in self management strategies to improve quality of life and functional outcomes.  Baseline:  Goal status: in progress   2.  Patient will self report 75% improvement to improve tolerance for functional activity  Baseline:  Goal status: in progress  3.  Patient will increase cervical ROM by 20 degrees total to improve ability to scan for safety with driving.   Baseline: see above Goal status: in progess   4.   Patient will improve NDI score by 5 points (17/50) to demonstrate improved perceived functional mobility Baseline: 22/50 Goal status: in progress  5.  Patient will work x 4 hours without headache > 2/10 Baseline: 6/10 Goal status: in progress   PLAN:  PT FREQUENCY: 1-2x/week  PT DURATION: 4 weeks  PLANNED INTERVENTIONS: 97164- PT Re-evaluation, 97110-Therapeutic exercises, 97530- Therapeutic activity, 97112- Neuromuscular re-education, 97535- Self Care, [id number]- Manual therapy, [phone]- Gait training, [phone]- Orthotic Fit/training, [phone]- Canalith repositioning, [id number]- Aquatic Therapy, [phone]- Splinting, Patient/Family education, Balance training, Stair training, Taping, Dry Needling, Joint mobilization, Joint manipulation, Spinal manipulation, Spinal mobilization, Scar mobilization, and DME instructions.   PLAN FOR NEXT SESSION:  assess reaction to DN; continued with cervical mobility and postural strengthening; reassess next visit   9:31 AM, 05/12/23 Khelani Kops Small Willem Klingensmith MPT [facility] [place] [phone] [phone]

## 2023-05-15 ENCOUNTER — Ambulatory Visit (HOSPITAL_COMMUNITY): Payer: BC Managed Care – PPO | Attending: Neurology

## 2023-05-15 DIAGNOSIS — M7918 Myalgia, other site: Secondary | ICD-10-CM | POA: Insufficient documentation

## 2023-05-15 DIAGNOSIS — G44221 Chronic tension-type headache, intractable: Secondary | ICD-10-CM | POA: Diagnosis not present

## 2023-05-15 DIAGNOSIS — M542 Cervicalgia: Secondary | ICD-10-CM | POA: Diagnosis not present

## 2023-05-15 NOTE — Therapy (Signed)
 PHYSICAL THERAPY CERVICAL DISCHARGE  PHYSICAL THERAPY DISCHARGE SUMMARY  Visits from Start of Care: 4  Current functional level related to goals / functional outcomes: See below   Remaining deficits: See below   Education / Equipment: HEP   Patient agrees to discharge. Patient goals were met. Patient is being discharged due to meeting the stated rehab goals.  Patient Name: Cathy Jones MRN: [id number] DOB:May 21, 1960, 63 y.o., female Today's Date: 05/15/2023  END OF SESSION:  PT End of Session - 05/15/23 1108     Visit Number 4    Number of Visits 8    Date for PT Re-Evaluation 05/28/23    Authorization Type BC[employer]    PT Start Time 1105    PT Stop Time 1130    PT Time Calculation (min) 25 min    Activity Tolerance Patient tolerated treatment well    Behavior During Therapy WFL for tasks assessed/performed             Past Medical History:  Diagnosis Date   Acute renal failure (ARF) (HCC) 08/07/2015   Allergy    Anemia    Anxiety    Asthma    pt brought her proair  inhaler 06-06-14   Cancer (HCC)    cervical   Colon polyp    hyperplastic   Colon stricture (HCC)    Colonic obstruction (HCC)    Depression    Family history of adverse reaction to anesthesia    pts mother experiences nausea and vomiting    GERD (gastroesophageal reflux disease)    History of bronchitis    History of cervical cancer    Stage IB adenocarcinoma of the cervix, treated with rad hyst, 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   History of chemotherapy    History of hiatal hernia    History of radiation therapy    Hyperlipidemia    Hypertension    Iliac artery occlusion, 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   Migraine    q couple years maybe (08/07/2015)   Shortness of breath dyspnea    seasonal    Past Surgical History:  Procedure Laterality Date   ABDOMINAL AORTOGRAM W/LOWER EXTREMITY Bilateral 07/26/2021   Procedure: ABDOMINAL AORTOGRAM W/LOWER EXTREMITY;  Surgeon: Magda Debby SAILOR, MD;  Location: [facility]  INVASIVE CV LAB;  Service: Cardiovascular;  Laterality: Bilateral;   AXILLARY-FEMORAL BYPASS GRAFT Right 08/26/2021   Procedure: RIGHT AXILLO-BIFEMORAL BYPASS WITH GORETEC GRAFT;  Surgeon: Magda Debby SAILOR, MD;  Location: [facility] OR;  Service: Vascular;  Laterality: Right;  right axilla to right groin, right groin to left groin   COLON RESECTION N/A 06/27/2015   Procedure: LYSIS OF ADHESIONS LOW ANTERIOR RESECTION DIVERTING LOOP ILEOSTOMY;  Surgeon: Bernarda Debby, MD;  Location: [facility] ORS;  Service: General;  Laterality: N/A;   COLON SURGERY     COLONOSCOPY     CYSTOSCOPY WITH STENT PLACEMENT Bilateral 06/27/2015   Procedure: CYSTOSCOPY WITH CATHETER  PLACEMENT;  Surgeon: Garnette Shack, MD;  Location: [facility] ORS;  Service: Urology;  Laterality: Bilateral;   DIVERTING ILEOSTOMY N/A 06/27/2015   Procedure: DIVERTING LOOP ILEOSTOMY;  Surgeon: Bernarda Debby, MD;  Location: [facility] ORS;  Service: General;  Laterality: N/A;   FEMORAL-FEMORAL BYPASS GRAFT Bilateral 08/10/2015   Procedure: BYPASS GRAFT LEFT FEMORALTO RIGHT FEMORAL ARTERY;  Surgeon: Krystal JULIANNA Doing, MD;  Location[facility] OR;  Service: Vascular;  Laterality: Bilateral;   FLEXIBLE SIGMOIDOSCOPY  12/12/2015   Procedure: FLEXIBLE SIGMOIDOSCOPY;  Surgeon: Bernarda Debby, MD;  Location: [facility] ORS;  Service: General;;  ILEOSTOMY CLOSURE N/A 12/12/2015   Procedure: OPEN ILEOSTOMY REVERSAL AND PELVIC WASHOUT;  Surgeon: Bernarda Ned, MD;  Location: [facility] ORS;  Service: General;  Laterality: N/A;   INTRAOPERATIVE ARTERIOGRAM Right 08/10/2015   Procedure: INTRA OPERATIVE ARTERIOGRAM Right Iliac Artery;  Surgeon: Krystal JULIANNA Doing, MD;  Location: [facility] OR;  Service: Vascular;  Laterality: Right;   IR GENERIC HISTORICAL  09/12/2015   IR RADIOLOGIST EVAL & MGMT 09/12/2015 Sharlet Candle, PA-C [facility] INTERV RAD   IR RADIOLOGIST EVAL & MGMT  06/23/2018   IR RADIOLOGIST EVAL & MGMT  07/07/2018   IV antibiotic infusion therapy      PERIPHERAL VASCULAR CATHETERIZATION N/A 08/09/2015    Procedure: Abdominal Aortogram w/Lower Extremity;  Surgeon: Redell LITTIE Door, MD;  Location: [facility] INVASIVE CV LAB;  Service: Cardiovascular;  Laterality: N/A;   PERIPHERAL VASCULAR CATHETERIZATION Left 08/09/2015   Procedure: Peripheral Vascular Intervention;  Surgeon: Redell LITTIE Door, MD;  Location: [facility] INVASIVE CV LAB;  Service: Cardiovascular;  Laterality: Left;  common iliac   PICC LINE PLACE PERIPHERAL ([facility] HX)     RADICAL ABDOMINAL HYSTERECTOMY  1994   THROMBECTOMY FEMORAL ARTERY Bilateral 09/05/2015   Procedure: THROMBECTOMY Left to Right Femoral to  FEMORAL ARTERY Bypass,.;  Surgeon: Lynwood JONETTA Collum, MD;  Location: [facility] OR;  Service: Vascular;  Laterality: Bilateral;   THROMBECTOMY ILIAC ARTERY Right 08/10/2015   Procedure: THROMBECTOMY Right ILIAC ARTERY;  Surgeon: Krystal JULIANNA Doing, MD;  Location: [facility] OR;  Service: Vascular;  Laterality: Right;   TUBAL LIGATION     UPPER GASTROINTESTINAL ENDOSCOPY     Patient Active Problem List   Diagnosis Date Noted   Vaginal vault smear abnormal 03/24/2023   Atrophic vaginitis 03/24/2023   Abnormal radiologic density 03/24/2023   Cigarette smoker 03/24/2023   Acute pulmonary edema (HCC) 02/17/2023   Elevated brain natriuretic peptide (BNP) level 02/17/2023   Elevated troponin 02/17/2023   Headache 02/17/2023   Multiple lung nodules on CT 02/17/2023   Hypertensive emergency 02/16/2023   Acute maxillary sinusitis 02/04/2023   Pelvic mass in female 01/31/2021   Malignant neoplasm of cervix (HCC) 01/31/2021   Peripheral vascular disease (HCC) 11/05/2020   Vitamin D deficiency 11/05/2020   History of malignant neoplasm of cervix 11/05/2020   Carotid artery stenosis 11/05/2020   Nicotine  dependence with current use 11/05/2020   Osteopenia 10/05/2019   Cervical high risk HPV (human papillomavirus) test positive 10/05/2019   Generalized abdominal pain    Pelvic fluid collection    Pelvic abscess in female 06/07/2018   Tobacco abuse 06/07/2018   PAD  (peripheral artery disease) (HCC) 08/10/2015   Injury of kidney 08/07/2015   B12 deficiency anemia 08/02/2015   Folic acid  deficiency anemia 08/02/2015   Essential hypertension 07/30/2015   Mixed hyperlipidemia 07/30/2015   Depression 07/30/2015   Ileostomy in place [facility]) 07/30/2015   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   Status post ileostomy (HCC) 07/30/2015   History of major abdominal surgery 07/30/2015   Rectal stricture s/p LAR resection [id number] 06/27/2015   Hypertension 06/08/2015   Pure hypercholesterolemia 06/08/2015   Obesity 06/08/2015   Migraine headache 06/08/2015   Elevated glucose 06/08/2015   Asthmatic bronchitis 06/08/2015   Anxiety 06/08/2015   Allergic rhinitis 06/08/2015   Compound nevus 08/19/2011    PCP: Norleen JENEANE Hurst, MD  REFERRING PROVIDER: Gregg Lek, MD  REFERRING DIAG: [phone] (ICD-10-CM) - Chronic tension-type headache, intractable M79.18 (ICD-10-CM) - Cervical myofascial pain syndrome M54.2 (ICD-10-CM) - Cervicalgia  THERAPY DIAG:  Chronic tension-type headache, intractable  Cervical myofascial pain syndrome  Cervicalgia  Rationale for Evaluation and Treatment: Rehabilitation  ONSET DATE: several months ago  SUBJECTIVE:                                                                                                                                                                                                         SUBJECTIVE STATEMENT: Feels 100% better  EVAL:Used to have migraines but then they went away gradually with imetrex shots; past few months started having daily headaches but not migraine level; running up the back of the neck.  Was in the hospital recently for some respiratory issues and had elevated BP with taking steroids; thought perhaps headache was due to high BP but that has now resolved and headaches remain.  She works 3rd shift so headaches seem to come on at night.  Has an automotive engineer. Saw [facility] after  hospitalization and he recommended therapy for DN due to tight neck muscles.   Hand dominance: Right  PERTINENT HISTORY:  migraine  PAIN:  Are you having pain? Yes: NPRS scale: 0-6/10 Pain location: back of neck, back of head Pain description: radiates from neck Aggravating factors: nighttime working Relieving factors: ibuprofen   PRECAUTIONS: None  RED FLAGS: None     WEIGHT BEARING RESTRICTIONS: No  FALLS:  Has patient fallen in last 6 months? No  OCCUPATION: transcribing   PLOF: Independent  PATIENT GOALS: to not have a headache  NEXT MD VISIT: PRN  OBJECTIVE:  Note: Objective measures were completed at Evaluation unless otherwise noted.  DIAGNOSTIC FINDINGS:    PATIENT SURVEYS:  NDI 22/50 44%  COGNITION: Overall cognitive status: Within functional limits for tasks assessed  SENSATION: WFL  POSTURE: rounded shoulders and forward head  PALPATION: Very tight and tender left upper trap and levator; reproduces symptoms with palpation   CERVICAL ROM:   Active ROM AROM (deg) eval AROM 05/15/23  Flexion 36 *pulls 40  Extension 42 56  Right lateral flexion 31 42  Left lateral flexion 32 44  Right rotation 50 68  Left rotation 57 74   (Blank rows = not tested)  UPPER EXTREMITY ROM:  Active ROM Right eval Left eval  Shoulder flexion    Shoulder extension    Shoulder abduction    Shoulder adduction    Shoulder extension    Shoulder internal rotation    Shoulder external rotation    Elbow flexion    Elbow extension    Wrist flexion    Wrist extension    Wrist ulnar  deviation    Wrist radial deviation    Wrist pronation    Wrist supination     (Blank rows = not tested)  UPPER EXTREMITY MMT:  MMT Right eval Left eval  Shoulder flexion 5 5  Shoulder extension    Shoulder abduction 5 5  Shoulder adduction    Shoulder extension    Shoulder internal rotation    Shoulder external rotation    Middle trapezius    Lower trapezius    Elbow  flexion 4+ 5  Elbow extension 5 5  Wrist flexion    Wrist extension    Wrist ulnar deviation    Wrist radial deviation    Wrist pronation    Wrist supination    Grip strength     (Blank rows = not tested)  CERVICAL SPECIAL TESTS:  Distraction test: not tested    TREATMENT DATE:  05/14/22 NDI 0 AROM of the cervical spine see above  05/12/23 Moist heat in sitting to left upper trap and levator x 5' to decrease pain and increase tissue extensibility Trigger Point Dry-Needling  Treatment instructions: Expect mild to moderate muscle soreness. S/S of pneumothorax if dry needled over a lung field, and to seek immediate medical attention should they occur. Patient verbalized understanding of these instructions and education.  Patient Consent Given: Yes Education handout provided: Previously provided Muscles treated: left rhomboid Electrical stimulation performed: No Parameters: N/A Treatment response/outcome: twitch response with shelf technique  Supine: Palpation to upper trap, levator, rhomboids to find trigger points; heavy palpation over ribs Seated: Rhomboid stretch 3 x 20 Postural reminders/education Scapular retractions x 5   05/04/23 Review of HEP and goals Moist heat in sitting to left upper trap and levator x 5' to decrease pain and increase tissue extensibility Seated: Upper trap stretch 5 x 20 Scapular retractions 5 hold x 10  Trigger Point Dry-Needling  Treatment instructions: Expect mild to moderate muscle soreness. S/S of pneumothorax if dry needled over a lung field, and to seek immediate medical attention should they occur. Patient verbalized understanding of these instructions and education.  Patient Consent Given: Yes Education handout provided: Previously provided Muscles treated: left levator Electrical stimulation performed: No Parameters: N/A Treatment response/outcome: twitch response; reported less tightness with stretch after  treatment    04/30/2023 physical therapy evaluation and HEP instruction   Trigger Point Dry-Needling  Treatment instructions: Expect mild to moderate muscle soreness. S/S of pneumothorax if dry needled over a lung field, and to seek immediate medical attention should they occur. Patient verbalized understanding of these instructions and education.  Patient Consent Given: Yes Education handout provided: Yes Muscles treated: left levator Electrical stimulation performed: No Parameters: N/A Treatment response/outcome: decreased tightness left levator; noted discomfort with needling                                                                                                                                 PATIENT EDUCATION:  Education details: Patient educated on exam findings, POC, scope of PT, HEP, and DN instructions and what to expect afterwards. Person educated: Patient Education method: Explanation, Demonstration, and Handouts Education comprehension: verbalized understanding, returned demonstration, verbal cues required, and tactile cues required  HOME EXERCISE PROGRAM: Access Code: [email or web address] URL: [email or web address] Date: 04/30/2023 Prepared by: [facility] - Rehab  Exercises - Gentle Levator Scapulae Stretch  - 2 x daily - 7 x weekly - 1 sets - 3 reps - 20 sec hold - seated posture with towel roll  - 2 x daily - 7 x weekly - 1 sets - 1 reps  ASSESSMENT:  CLINICAL IMPRESSION: Patient reports her symptoms have resolved.  Reassessed today and she has met all set rehab goals; agreeable to discharge at this time.        Eval:Patient is a 63 y.o. female who was seen today for physical therapy evaluation and treatment for G44.221 (ICD-10-CM) - Chronic tension-type headache, intractable M79.18 (ICD-10-CM) - Cervical myofascial pain syndrome M54.2 (ICD-10-CM) - Cervicalgia. Patient demonstrates decreased strength, ROM restriction, reduced flexibility, increased  tenderness to palpation and postural abnormalities which are likely contributing to symptoms of pain and are negatively impacting patient ability to perform ADLs. Patient will benefit from skilled physical therapy services to address these deficits to reduce pain and improve level of function with ADLs   OBJECTIVE IMPAIRMENTS: decreased activity tolerance, decreased mobility, decreased ROM, decreased strength, hypomobility, increased fascial restrictions, impaired perceived functional ability, increased muscle spasms, impaired flexibility, impaired UE functional use, postural dysfunction, and pain.   ACTIVITY LIMITATIONS: carrying, lifting, bending, sitting, sleeping, reach over head, hygiene/grooming, and caring for others  PARTICIPATION LIMITATIONS: meal prep, cleaning, laundry, driving, shopping, community activity, and occupation   REHAB POTENTIAL: Good  CLINICAL DECISION MAKING: Evolving/moderate complexity  EVALUATION COMPLEXITY: Low   GOALS: Goals reviewed with patient? No  SHORT TERM GOALS: Target date: 05/14/2023  patient will be independent with initial HEP  Baseline:  Goal status:  met  2.  Patient will self report 50% improvement to improve tolerance for functional activity  Baseline:  Goal status: met   LONG TERM GOALS: Target date: 05/28/2023  Patient will be independent in self management strategies to improve quality of life and functional outcomes.  Baseline:  Goal status: met   2.  Patient will self report 75% improvement to improve tolerance for functional activity  Baseline:  Goal status: met  3.  Patient will increase cervical ROM by 20 degrees total to improve ability to scan for safety with driving.   Baseline: see above Goal status: met  4.  Patient will improve NDI score by 5 points (17/50) to demonstrate improved perceived functional mobility Baseline: 22/50 Goal status: met  5.  Patient will work x 4 hours without headache >  2/10 Baseline: 6/10 Goal status:met  PLAN:  PT FREQUENCY: 1-2x/week  PT DURATION: 4 weeks  PLANNED INTERVENTIONS: 97164- PT Re-evaluation, 97110-Therapeutic exercises, 97530- Therapeutic activity, 97112- Neuromuscular re-education, 97535- Self Care, 02859- Manual therapy, [phone]- Gait training, [phone]- Orthotic Fit/training, [phone]- Canalith repositioning, [id number]- Aquatic Therapy, [phone]- Splinting, Patient/Family education, Balance training, Stair training, Taping, Dry Needling, Joint mobilization, Joint manipulation, Spinal manipulation, Spinal mobilization, Scar mobilization, and DME instructions.   PLAN FOR NEXT SESSION:  discharge   11:27 AM, 05/15/23 Axel Frisk Small Areliz Rothman MPT [place] [facility] [place] [phone]

## 2023-05-19 ENCOUNTER — Other Ambulatory Visit: Payer: Self-pay

## 2023-05-19 DIAGNOSIS — I739 Peripheral vascular disease, unspecified: Secondary | ICD-10-CM

## 2023-05-25 ENCOUNTER — Other Ambulatory Visit: Payer: Self-pay | Admitting: Neurology

## 2023-06-02 ENCOUNTER — Telehealth: Payer: Self-pay

## 2023-06-02 NOTE — Telephone Encounter (Signed)
I left a message for the patient to return my call.

## 2023-06-02 NOTE — Telephone Encounter (Signed)
-----   Message from Sandrea Hughs sent at 05/22/2023  5:15 PM EST ----- Call patient :  Study is unremarkable, no change in recs

## 2023-06-04 NOTE — Telephone Encounter (Signed)
Called and spoke with patient informed patient of her results pt stated that she understood her result

## 2023-06-04 NOTE — Telephone Encounter (Signed)
PT ret your call. Please try again. Her # is [phone]

## 2023-06-22 NOTE — Progress Notes (Unsigned)
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 Continue aspirin and statins   Discharge Diagnoses: Principal Problem:   Hypertensive emergency   Essential hypertension   Mixed hyperlipidemia   Acute pulmonary edema (HCC)   Elevated brain natriuretic peptide (BNP) level   Elevated troponin   Headache   Lung nodules   Cathy Jones is a 63 y.o. female with medical history significant of hypertension, hyperlipidemia who presented  to the emergency department due to 2 to 3-week onset of shortness of breath associated with cough, patient was recently diagnosed with bronchitis and was treated with steroids, symptoms momentarily improved, then worsened within the last few days.  AM of admit  she had increased shortness of breath and elevated blood pressure which was associated with headache treated and patient was taken to the ED for further evaluation and management and admitted with acute CHF exacerbation in the setting of hypertensive emergency with elevated troponin levels.  She was initially started on nitroglycerin drip and this has been transition to Cardene and cardiology following with 2D echocardiogram pending.     Acute CHF exacerbation  2D echocardiogram reviewed normal EF, normal LV function, mild aortic  regurgitation Continue aspirin and rosuvastatin, and added Lasix and losartan   Elevated troponin possibly secondary to type II demand ischemia Denied any chest pain no overt EKG changes Troponin is flattened-  221 > 354 > 225 > 187.   Appreciate ongoing cardiology : Recommending continuing aspirin, statins, increasing carvedilol   Incidental finding of lung nodules CT of  chest showed solid nodules of the right middle lobe, largest measuring up to 14 mm. Chest CT in 3 months was recommended per radiologist         History of Present Illness  03/24/2023  Pulmonary/ 1st office eval/ Cathy Jones / [place] [facility]  Chief Complaint  Patient presents with   Establish Care   Lung Nodules    CT 02/16/23  Dyspnea:  Not limited by breathing from desired activities   Was  treadmill up to 7 d a week x 3 miles / day [place]  Cough: non productive but rattling  Sleep: sleeping on couch with pillow on arm  SABA use: hardly ever  02: none  Lung cancer screen: n/a Rec For cough/ congestion > mucinex  up to maximum of  1200 mg every 12 hours and use the flutter valve as much as you can   My office will be contacting you by phone for referral for flutter valve and CT chest by end of year  - if you don't hear back from my office within one week please call us back or notify us thru MyChart and  we'll address it right away   Please schedule a follow up visit in 3 months but call sooner if needed  with all medications /inhalers/ solutions/flutter valve  in hand so we can verify exactly what you are taking. This includes all medications from all doctors and over the counters     06/24/2023  f/u ov/[place] office/Cathy Jones re: AB maint on prn saba   Chief Complaint  Patient presents with   Medical Management of Chronic Issues   Dyspnea:  can't do treadmill due to daughter moved into room  Cough: acutely worse 2 weeks ago assoc with yellow pnd and nasal congestion Sleeping: still on couch, cough  worse at hs  SABA use: not used on day of ov, up to twice daily during flare (did not remember action plan  02: none   Lung cancer screening: referred today    No obvious day to day or daytime variability or assoc excess/ purulent sputum or mucus plugs or hemoptysis or cp or chest tightness, subjective wheeze or overt   hb symptoms.    Also denies any obvious fluctuation of symptoms with weather or environmental changes or other aggravating or alleviating factors except as outlined above   No unusual exposure hx or h/o childhood pna/ asthma or knowledge of premature birth.  Current Allergies, Complete Past Medical History, Past Surgical History, Family History, and Social History were reviewed in [employer] record.  ROS  The following are not active complaints unless bolded Hoarseness, sore throat, dysphagia, dental problems, itching, sneezing,  nasal congestion or discharge of excess mucus or purulent secretions, ear ache,   fever, chills, sweats, unintended wt loss or wt gain, classically pleuritic or exertional cp,  orthopnea pnd or arm/hand swelling  or leg swelling, presyncope, palpitations, abdominal pain, anorexia, nausea, vomiting, diarrhea  or change in bowel habits or change in bladder habits, change in stools or change in urine, dysuria, hematuria,  rash, arthralgias, visual complaints, headache, numbness, weakness or ataxia or problems with walking or coordination,  change in mood or  memory.        Current Meds  Medication Sig   albuterol (VENTOLIN HFA) 108 (90 Base) MCG/ACT inhaler Inhale 2 puffs into the lungs every 4 (four) hours as needed.   aspirin EC 81 MG tablet Take 81 mg by mouth daily. Swallow whole.   carvedilol (COREG) 12.5 MG tablet Take 1 tablet (12.5 mg total) by mouth 2 (two) times daily with a meal.   ezetimibe (ZETIA) 10 MG tablet Take 1 tablet (10 mg total) by mouth daily.   furosemide (LASIX) 40 MG tablet Take 1 tablet (40 mg total) by  mouth daily. (Patient taking differently: Take 20 mg by mouth daily.)   losartan (COZAAR) 50 MG tablet Take 1 tablet (50 mg total) by mouth daily.   rosuvastatin (CRESTOR) 10 MG tablet Take 1 tablet (10 mg total) by mouth daily.   tiZANidine (ZANAFLEX) 4 MG tablet TAKE 1 TABLET(4 MG) BY MOUTH EVERY 8 HOURS AS NEEDED FOR MUSCLE SPASMS   topiramate (TOPAMAX) 25 MG tablet Take 1 tablet (25 mg total) by mouth daily.   Vitamin D, Ergocalciferol, (DRISDOL) 1.25 MG (50000 UNIT) CAPS capsule Take 50,000 Units by mouth every Saturday.           Past Medical History:  Diagnosis Date   Acute renal failure (ARF) (HCC) 08/07/2015   Allergy    Anemia    Anxiety    Asthma    pt brought her  proair inhaler 06-06-14   Cancer (HCC)    cervical   Colon polyp    hyperplastic   Colon stricture (HCC)    Colonic obstruction (HCC)    Depression    Family history of adverse reaction to anesthesia    pts mother experiences nausea and vomiting    GERD (gastroesophageal reflux disease)    History of bronchitis    History of cervical cancer    Stage IB adenocarcinoma of the cervix, treated with rad hyst, 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   History of chemotherapy    History of hiatal hernia    History of radiation therapy    Hyperlipidemia    Hypertension    Iliac artery occlusion, 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   Migraine    "q couple years maybe" (08/07/2015)   Shortness of breath dyspnea    seasonal       Objective:    wts  06/24/2023        188   05/05/23 181 lb (82.1 kg)  04/24/23 186 lb 6.4 oz (84.6 kg)  04/24/23 186 lb 8 oz (84.6 kg)      Vital signs reviewed  06/24/2023  - Note at rest 02 sats  94% on RA   General appearance:    amb wm nad/ congested cough        HEENT : Oropharynx  / edentulous    Nasal turbinates mod edema/ no polyps   NECK :  without  apparent JVD/ palpable Nodes/TM    LUNGS: no acc muscle use,  Min barrel  contour chest wall with bilateral  slightly decreased bs s audible  wheeze and  without cough on insp or exp maneuvers and min  Hyperresonant  to  percussion bilaterally    CV:  RRR  no s3 or murmur or increase in P2, and no edema   ABD:  soft and nontender with pos end  insp Hoover's  in the supine position.  No bruits or organomegaly appreciated   MS:  Nl gait/ ext warm without deformities Or obvious joint restrictions  calf tenderness, cyanosis or clubbing     SKIN: warm and dry without lesions    NEURO:  alert, approp, nl sensorium with  no motor or cerebellar deficits apparent.            I personally reviewed images and agree with radiology impression as follows:   Chest CTa    02/16/23  1. No evidence of pulmonary embolus. 2. Small bilateral pleural effusions with bronchial wall and septal thickening, likely due to pulmonary edema. 3. Solid nodules of the right middle lobe, largest measuring up to 14 mm.  Image 70 and 80 4. Mildly enlarged mediastinal and bilateral hilar lymph nodes, likely reactive. 5. Aortic Atherosclerosis (ICD10-I70.0).        I personally reviewed images and agree with radiology impression as follows:   Chest CT12/31/24    w/o contrast 1. No suspicious pulmonary nodules. 2. Cholelithiasis. 3.  Aortic atherosclerosis (ICD10-I70.0).  Coronary calcification. 4.  Emphysema (ICD10-J43.9).     Assessment

## 2023-06-24 ENCOUNTER — Encounter: Payer: Self-pay | Admitting: Internal Medicine

## 2023-06-24 ENCOUNTER — Ambulatory Visit: Payer: BC Managed Care – PPO | Admitting: Internal Medicine

## 2023-06-24 VITALS — BP 168/98 | HR 74 | Ht 62.0 in | Wt 188.0 lb

## 2023-06-24 DIAGNOSIS — I10 Essential (primary) hypertension: Secondary | ICD-10-CM

## 2023-06-24 DIAGNOSIS — F17210 Nicotine dependence, cigarettes, uncomplicated: Secondary | ICD-10-CM | POA: Diagnosis not present

## 2023-06-24 DIAGNOSIS — J45909 Unspecified asthma, uncomplicated: Secondary | ICD-10-CM | POA: Diagnosis not present

## 2023-06-24 DIAGNOSIS — J4531 Mild persistent asthma with (acute) exacerbation: Secondary | ICD-10-CM

## 2023-06-24 MED ORDER — METHYLPREDNISOLONE ACETATE 40 MG/ML INJ SUSP (RADIOLOG: 120.0000 mg | Freq: Once | INTRAMUSCULAR | Status: DC

## 2023-06-24 MED ORDER — METHYLPREDNISOLONE ACETATE 80 MG/ML IJ SUSP
120.0000 mg | Freq: Once | INTRAMUSCULAR | Status: AC
Start: 2023-06-24 — End: 2023-06-24
  Administered 2023-06-24: 120 mg via INTRAMUSCULAR

## 2023-06-24 MED ORDER — CEFDINIR 300 MG PO CAPS: 300.0000 mg | ORAL_CAPSULE | Freq: Two times a day (BID) | ORAL | 0 refills | Status: AC

## 2023-06-24 MED ORDER — BISOPROLOL FUMARATE 5 MG PO TABS
ORAL_TABLET | ORAL | 11 refills | Status: AC
Start: 2023-06-24 — End: ?

## 2023-06-24 NOTE — Patient Instructions (Addendum)
My office will be contacting you by phone for referral to lung cancer screening   [phone]) - if you don't hear back from my office within one week,  please call us back or notify us thru MyChart and we'll address it right away.   For cough/ congestion > mucinex  up to maximum of  1200 mg every 12 hours and use the flutter valve as much as you can     Only use your albuterol as a rescue medication to be used if you can't catch your breath by resting or doing a relaxed purse lip breathing pattern.  - The less you use it, the better it will work when you need it. - Ok to use up to 2 puffs  every 4 hours if you must but call for immediate appointment if use goes up over your usual need - Don't leave home without it !!  (think of it like the spare tire for your car)    Start bisoprolol 5 mg twice daily in place or corevidol   Omnicef 300 mg twice daily x 10 days   Depomedrol 120 mg IM   The key is to stop smoking completely before smoking completely stops you!    Please schedule a follow up visit in 3 months but call sooner if needed - bring inhaler

## 2023-06-25 ENCOUNTER — Encounter: Payer: Self-pay | Admitting: Internal Medicine

## 2023-06-25 NOTE — Assessment & Plan Note (Signed)
Active smoker with good baseline ex tol until uri Late sept  2024 > pred/ pseudofed > admit with mild chf  02/16/23  - 03/24/2023 rec mucinex and prn saba and d/c smoking  - CT 05/12/23   centrilobular emphysema  Acute flare assoc with rhinitis and ? Sinusitis   >>>  omnicef x 10 d and depomedrol 120 mg IM  Reviewe action plan re use of saba and mucinex / flutter valve

## 2023-06-25 NOTE — Assessment & Plan Note (Signed)
Counseled re importance of smoking cessation but did not meet time criteria for separate billing

## 2023-06-25 NOTE — Assessment & Plan Note (Signed)
Poor control and clearly not adequately BBlocked  In the setting of respiratory symptoms of unknown etiology,  It would be preferable to use bystolic, the most beta -1  selective Beta blocker available in sample form, with bisoprolol the most selective generic choice  on the market, at least on a trial basis, to make sure the spillover Beta 2 effects of the less specific Beta blockers are not contributing to this patient's symptoms.   >>> try bisoprolol 5 mg bid   F/u in 3 m         Each maintenance medication was reviewed in detail including emphasizing most importantly the difference between maintenance and prns and under what circumstances the prns are to be triggered using an action plan format where appropriate.  Total time for H and P, chart review, counseling, reviewing hfa/neb/ flutter  device(s) and generating customized AVS unique to this office visit / same day charting = 31 m

## 2023-07-03 ENCOUNTER — Other Ambulatory Visit: Payer: Self-pay

## 2023-07-03 MED ORDER — EZETIMIBE 10 MG PO TABS: 10.0000 mg | ORAL_TABLET | Freq: Every day | ORAL | 3 refills | Status: DC

## 2023-09-16 NOTE — Progress Notes (Signed)
 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 Continue aspirin  and statins   Discharge Diagnoses: Principal Problem:   Hypertensive emergency   Essential hypertension   Mixed hyperlipidemia   Acute pulmonary edema (HCC)   Elevated brain natriuretic peptide (BNP) level   Elevated troponin   Headache   Lung nodules   Cathy Jones is a 63 y.o. female with medical history significant of hypertension, hyperlipidemia who presented  to the emergency department due to 2 to 3-week onset of shortness of breath associated with cough, patient was recently diagnosed with bronchitis and was treated with steroids, symptoms momentarily improved, then worsened within the last few days.  AM of admit  she had increased shortness of breath and elevated blood pressure which was associated with headache treated and patient was taken to the ED for further evaluation and management and admitted with acute CHF exacerbation in the setting of hypertensive emergency with elevated troponin levels.  She was initially started on nitroglycerin  drip and this has been transition to Cardene  and cardiology following with 2D echocardiogram pending.     Acute CHF exacerbation  2D echocardiogram reviewed normal EF, normal LV function, mild aortic  regurgitation Continue aspirin  and rosuvastatin , and added Lasix  and losartan    Elevated troponin possibly secondary to type II demand ischemia Denied any chest pain no overt EKG changes Troponin is flattened-  221 > 354 > 225 > 187.   Appreciate ongoing cardiology : Recommending continuing aspirin , statins, increasing carvedilol    Incidental finding of lung nodules CT of  chest showed solid nodules of the right middle lobe, largest measuring up to 14 mm. Chest CT in 3 months was recommended per radiologist         History of Present Illness  03/24/2023  Pulmonary/ 1st office eval/ Waymond Hailey / [place] [facility]  Chief Complaint  Patient presents with   Establish Care   Lung Nodules    CT 02/16/23  Dyspnea:  Not limited by breathing from desired activities   Was  treadmill up to 7 d a week x 3 miles / day [place]  Cough: non productive but rattling  Sleep: sleeping on couch with pillow on arm  SABA use: hardly ever  02: none  Lung cancer screen: n/a Rec For cough/ congestion > mucinex   up to maximum of  1200 mg every 12 hours and use the flutter valve as much as you can   My office will be contacting you by phone for referral for flutter valve and CT chest by end of year  - if you don't hear back from my office within one week please call us  back or notify us  thru MyChart and  we'll address it right away   Please schedule a follow up visit in 3 months but call sooner if needed  with all medications /inhalers/ solutions/flutter valve  in hand so we can verify exactly what you are taking. This includes all medications from all doctors and over the counters     06/24/2023  f/u ov/[place] office/Alyanna Stoermer re: AB maint on prn saba   Chief Complaint  Patient presents with   Medical Management of Chronic Issues   Dyspnea:  can't do treadmill due to daughter moved into room  Cough: acutely worse 2 weeks ago assoc with yellow pnd and nasal congestion Sleeping: still on couch, cough  worse at hs  SABA use: not used on day of ov, up to twice daily during flare (did not remember action plan  02: none  Lung cancer screening: referred today  Rec For cough/ congestion > mucinex   up to maximum of  1200 mg every 12 hours and use the flutter valve as much as you can    Only use your albuterol  as a rescue medication  Start bisoprolol  5 mg twice daily in place or corevidol  Omnicef  300 mg twice daily x 10 days  Depomedrol 120 mg IM  The key is to stop smoking completely before smoking completely stops you!   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    Also denies any obvious fluctuation of symptoms with weather or environmental changes or other aggravating or alleviating factors except as outlined above   No unusual exposure hx or h/o childhood pna/ asthma or knowledge of premature birth.  Current Allergies, Complete Past Medical History, Past Surgical History, Family History, and Social History were reviewed in [employer] record.  ROS  The following are not active complaints unless bolded Hoarseness, sore throat, dysphagia, dental problems, itching, sneezing,  nasal congestion or discharge of excess mucus or purulent secretions, ear ache,   fever, chills, sweats, unintended wt loss or wt gain,  classically pleuritic or exertional cp,  orthopnea pnd or arm/hand swelling  or leg swelling, presyncope, palpitations, abdominal pain, anorexia, nausea, vomiting, diarrhea  or change in bowel habits or change in bladder habits, change in stools or change in urine, dysuria, hematuria,  rash, arthralgias, visual complaints, headache, numbness, weakness or ataxia or problems with walking or coordination,  change in mood or  memory.        Current Meds  Medication Sig   albuterol  (VENTOLIN  HFA) 108 (90 Base) MCG/ACT inhaler Inhale 2 puffs into the lungs every 4 (four) hours as needed.   aspirin  EC 81 MG tablet Take 81 mg by mouth daily. Swallow whole.   bisoprolol  (ZEBETA ) 5 MG tablet One twice daily  ezetimibe  (ZETIA ) 10 MG tablet Take 1 tablet (10 mg total) by mouth daily.   furosemide  (LASIX ) 40 MG tablet Take 1 tablet (40 mg total) by mouth daily. (Patient taking differently: Take 20 mg by mouth daily.)   losartan  (COZAAR ) 50 MG tablet Take 1 tablet (50 mg total) by mouth daily.   rosuvastatin  (CRESTOR ) 10 MG tablet Take 1 tablet (10 mg total) by mouth daily.   tiZANidine  (ZANAFLEX ) 4 MG tablet TAKE 1 TABLET(4 MG) BY MOUTH EVERY 8 HOURS AS NEEDED FOR MUSCLE SPASMS   topiramate  (TOPAMAX ) 25 MG tablet Take 1 tablet (25 mg total) by mouth daily.   Vitamin D, Ergocalciferol, (DRISDOL) 1.25 MG (50000 UNIT) CAPS capsule Take 50,000 Units by mouth every Saturday.              Past Medical History:  Diagnosis Date   Acute renal failure (ARF) (HCC) 08/07/2015   Allergy    Anemia    Anxiety    Asthma    pt brought her proair  inhaler 06-06-14   Cancer (HCC)    cervical   Colon polyp    hyperplastic   Colon stricture (HCC)    Colonic obstruction (HCC)    Depression    Family history of adverse reaction to anesthesia    pts mother experiences nausea and vomiting    GERD (gastroesophageal reflux disease)    History of bronchitis    History of cervical cancer    Stage IB adenocarcinoma of  the cervix, treated with rad hyst, 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   History of chemotherapy    History of hiatal hernia    History of radiation therapy    Hyperlipidemia    Hypertension    Iliac artery occlusion, 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   Migraine    "q couple years maybe" (08/07/2015)   Shortness of breath dyspnea    seasonal       Objective:    wts  09/18/2023          192  06/24/2023        188   05/05/23 181 lb (82.1 kg)  04/24/23 186 lb 6.4 oz (84.6 kg)  04/24/23 186 lb 8 oz (84.6 kg)    Vital signs reviewed  09/18/2023  - Note at rest 02 sats  97% on RA   General appearance:    amb mod obese (by bmi) wf nad    HEENT : Oropharynx  clear  Nasal turbinates nl    NECK :  without  apparent JVD/ palpable Nodes/TM    LUNGS: no acc muscle use,  Min barrel  contour chest wall with bilateral  slightly decreased bs s audible wheeze and  without cough on insp or exp maneuvers and min  Hyperresonant  to  percussion bilaterally    CV:  RRR  no s3 or murmur or increase in P2, and no edema   ABD:  soft and nontender with pos end  insp Hoover's  in the supine position.  No bruits or organomegaly appreciated   MS:  Nl gait/ ext warm without deformities Or obvious joint restrictions  calf tenderness, cyanosis or clubbing     SKIN: warm and dry without lesions    NEURO:  alert, approp, nl sensorium with  no motor or cerebellar deficits apparent.               Assessment

## 2023-09-17 ENCOUNTER — Other Ambulatory Visit: Payer: Self-pay | Admitting: Neurology

## 2023-09-18 ENCOUNTER — Encounter: Payer: Self-pay | Admitting: Internal Medicine

## 2023-09-18 ENCOUNTER — Ambulatory Visit: Payer: BC Managed Care – PPO | Admitting: Internal Medicine

## 2023-09-18 VITALS — BP 185/83 | HR 65 | Ht 62.0 in | Wt 192.8 lb

## 2023-09-18 DIAGNOSIS — F17210 Nicotine dependence, cigarettes, uncomplicated: Secondary | ICD-10-CM

## 2023-09-18 DIAGNOSIS — J4530 Mild persistent asthma, uncomplicated: Secondary | ICD-10-CM | POA: Diagnosis not present

## 2023-09-18 NOTE — Patient Instructions (Addendum)
 Only use your albuterol  as a rescue medication to be used if you can't catch your breath by resting or doing a relaxed purse lip breathing pattern.  - The less you use it, the better it will work when you need it. - Ok to use up to 2 puffs  every 4 hours if you must but call for immediate appointment if use goes up over your usual need - Don't leave home without it !!  (think of it like the spare tire for your car)   Also  Ok to try albuterol  15 min before an activity (on alternating days)  that you know would usually make you short of breath and see if it makes any difference and if makes none then don't take albuterol  after activity unless you can't catch your breath as this means it's the resting that helps, not the albuterol .  The key is to stop smoking completely before smoking completely stops you!  Pulmonary follow up is as needed  if you start requiring the albuterol  more often.

## 2023-09-18 NOTE — Assessment & Plan Note (Signed)
 4-5 min discussion re active cigarette smoking in addition to office E&M  Ask about tobacco use:   ongoing  Advise quitting   I took an extended  opportunity with this patient to outline the consequences of continued cigarette use  in airway disorders based on all the data we have from the multiple national lung health studies (perfomed over decades at millions of dollars in cost)  indicating that smoking cessation, not choice of inhalers or pulmonary physicians, is the most important aspect of her car care.   Assess willingness:  Not committed at this point Assist in quit attempt:  Per PCP when ready Arrange follow up:   Follow up per Primary Care planned     Low-dose CT lung cancer screening is recommended for patients who are 27-50 years of age with a 20+ pack-year history of smoking and who are currently smoking or quit <=15 years ago. No coughing up blood  No unintentional weight loss of > 15 pounds in the last 6 months - pt is eligible for scanning yearly until 15 y p last smokes > continue as planned with serial LDSCT

## 2023-09-18 NOTE — Assessment & Plan Note (Signed)
 Active smoker with good baseline ex tol until uri Late sept  2024 > pred/ pseudofed > admit with mild chf  02/16/23  - 03/24/2023 rec mucinex  and prn saba and d/c smoking  - CT 05/12/23   centrilobular emphysema - 09/18/2023  After extensive coaching inhaler device,  effectiveness =  60%   Rx as AB approp here as long as saba dependency not an issue  Re SABA :  I spent extra time with pt today reviewing appropriate use of albuterol  for prn use on exertion with the following points: 1) saba is for relief of sob that does not improve by walking a slower pace or resting but rather if the pt does not improve after trying this first. 2) If the pt is convinced, as many are, that saba helps recover from activity faster then it's easy to tell if this is the case by re-challenging : ie stop, take the inhaler, then p 5 minutes try the exact same activity (intensity of workload) that just caused the symptoms and see if they are substantially diminished or not after saba 3) if there is an activity that reproducibly causes the symptoms, try the saba 15 min before the activity on alternate days   If in fact the saba really does help, then fine to continue to use it prn but advised may need to look closer at the maintenance regimen (for now 0, esp since don't have PFTs and some of her symptoms may well be related to HBP/ CHF)  being used to achieve better control of airways disease with exertion.   Discussed in detail all the  indications, usual  risks and alternatives  relative to the benefits with patient who agrees to proceed with Rx as outlined.      F/u can be prn     Each maintenance medication was reviewed in detail including emphasizing most importantly the difference between maintenance and prns and under what circumstances the prns are to be triggered using an action plan format where appropriate.  Total time for H and P, chart review, counseling, reviewing hfa  device(s) and generating customized AVS  unique to this office visit / same day charting = 30 min

## 2023-09-25 DIAGNOSIS — D72829 Elevated white blood cell count, unspecified: Secondary | ICD-10-CM | POA: Diagnosis not present

## 2023-09-25 DIAGNOSIS — E782 Mixed hyperlipidemia: Secondary | ICD-10-CM | POA: Diagnosis not present

## 2023-09-25 DIAGNOSIS — E559 Vitamin D deficiency, unspecified: Secondary | ICD-10-CM | POA: Diagnosis not present

## 2023-09-25 DIAGNOSIS — R7301 Impaired fasting glucose: Secondary | ICD-10-CM | POA: Diagnosis not present

## 2023-10-01 DIAGNOSIS — I739 Peripheral vascular disease, unspecified: Secondary | ICD-10-CM | POA: Diagnosis not present

## 2023-10-01 DIAGNOSIS — D72829 Elevated white blood cell count, unspecified: Secondary | ICD-10-CM | POA: Diagnosis not present

## 2023-10-01 DIAGNOSIS — E782 Mixed hyperlipidemia: Secondary | ICD-10-CM | POA: Diagnosis not present

## 2023-10-01 DIAGNOSIS — N390 Urinary tract infection, site not specified: Secondary | ICD-10-CM | POA: Diagnosis not present

## 2023-10-01 DIAGNOSIS — I6529 Occlusion and stenosis of unspecified carotid artery: Secondary | ICD-10-CM | POA: Diagnosis not present

## 2023-10-01 DIAGNOSIS — E875 Hyperkalemia: Secondary | ICD-10-CM | POA: Diagnosis not present

## 2023-10-15 DIAGNOSIS — B372 Candidiasis of skin and nail: Secondary | ICD-10-CM | POA: Diagnosis not present

## 2023-10-15 DIAGNOSIS — I10 Essential (primary) hypertension: Secondary | ICD-10-CM | POA: Diagnosis not present

## 2023-10-22 DIAGNOSIS — I129 Hypertensive chronic kidney disease with stage 1 through stage 4 chronic kidney disease, or unspecified chronic kidney disease: Secondary | ICD-10-CM | POA: Diagnosis not present

## 2023-10-22 DIAGNOSIS — N1831 Chronic kidney disease, stage 3a: Secondary | ICD-10-CM | POA: Diagnosis not present

## 2023-10-22 DIAGNOSIS — Z79899 Other long term (current) drug therapy: Secondary | ICD-10-CM | POA: Diagnosis not present

## 2023-12-13 ENCOUNTER — Other Ambulatory Visit: Payer: Self-pay | Admitting: Neurology

## 2023-12-16 ENCOUNTER — Other Ambulatory Visit (HOSPITAL_COMMUNITY): Payer: Self-pay | Admitting: Nephrology

## 2023-12-16 DIAGNOSIS — E875 Hyperkalemia: Secondary | ICD-10-CM | POA: Diagnosis not present

## 2023-12-16 DIAGNOSIS — R809 Proteinuria, unspecified: Secondary | ICD-10-CM

## 2023-12-16 DIAGNOSIS — I129 Hypertensive chronic kidney disease with stage 1 through stage 4 chronic kidney disease, or unspecified chronic kidney disease: Secondary | ICD-10-CM

## 2023-12-16 DIAGNOSIS — N1832 Chronic kidney disease, stage 3b: Secondary | ICD-10-CM | POA: Diagnosis not present

## 2023-12-16 DIAGNOSIS — N1831 Chronic kidney disease, stage 3a: Secondary | ICD-10-CM

## 2023-12-16 DIAGNOSIS — I5032 Chronic diastolic (congestive) heart failure: Secondary | ICD-10-CM | POA: Diagnosis not present

## 2023-12-21 ENCOUNTER — Ambulatory Visit (HOSPITAL_COMMUNITY)
Admission: RE | Admit: 2023-12-21 | Discharge: 2023-12-21 | Disposition: A | Discharge status: Home / Self Care | Source: Ambulatory Visit | Attending: Nephrology | Admitting: Nephrology

## 2023-12-21 DIAGNOSIS — I129 Hypertensive chronic kidney disease with stage 1 through stage 4 chronic kidney disease, or unspecified chronic kidney disease: Secondary | ICD-10-CM | POA: Insufficient documentation

## 2023-12-21 DIAGNOSIS — R809 Proteinuria, unspecified: Secondary | ICD-10-CM | POA: Diagnosis not present

## 2023-12-21 DIAGNOSIS — N1832 Chronic kidney disease, stage 3b: Secondary | ICD-10-CM | POA: Diagnosis not present

## 2023-12-21 DIAGNOSIS — N261 Atrophy of kidney (terminal): Secondary | ICD-10-CM | POA: Diagnosis not present

## 2023-12-21 DIAGNOSIS — N1831 Chronic kidney disease, stage 3a: Secondary | ICD-10-CM | POA: Insufficient documentation

## 2024-01-26 ENCOUNTER — Telehealth: Payer: Self-pay | Admitting: Cardiovascular Disease

## 2024-01-26 MED ORDER — EZETIMIBE 10 MG PO TABS: 10.0000 mg | ORAL_TABLET | Freq: Every day | ORAL | 0 refills | Status: AC

## 2024-01-26 NOTE — Telephone Encounter (Signed)
*  STAT* If patient is at the pharmacy, call can be transferred to refill team.   1. Which medications need to be refilled? (please list name of each medication and dose if known)   ezetimibe  (ZETIA ) 10 MG tablet   2. Would you like to learn more about the convenience, safety, & potential cost savings by using the [facility]?   3. Are you open to using the [facility] (Type [facility]. ).  4. Which pharmacy/location (including street and city if local pharmacy) is medication to be sent to?  [id number] - [place], [place] - [address]   5. Do they need a 30 day or 90 day supply? 90 day  Patient stated she is completely out of this medication.

## 2024-01-26 NOTE — Telephone Encounter (Signed)
 Pt's medication was sent to pt's pharmacy as requested. Confirmation received.

## 2024-02-01 DIAGNOSIS — E559 Vitamin D deficiency, unspecified: Secondary | ICD-10-CM | POA: Diagnosis not present

## 2024-02-01 DIAGNOSIS — R7301 Impaired fasting glucose: Secondary | ICD-10-CM | POA: Diagnosis not present

## 2024-02-01 DIAGNOSIS — D72829 Elevated white blood cell count, unspecified: Secondary | ICD-10-CM | POA: Diagnosis not present

## 2024-02-01 DIAGNOSIS — E782 Mixed hyperlipidemia: Secondary | ICD-10-CM | POA: Diagnosis not present

## 2024-02-05 DIAGNOSIS — D72829 Elevated white blood cell count, unspecified: Secondary | ICD-10-CM | POA: Diagnosis not present

## 2024-02-05 DIAGNOSIS — E875 Hyperkalemia: Secondary | ICD-10-CM | POA: Diagnosis not present

## 2024-02-05 DIAGNOSIS — N1831 Chronic kidney disease, stage 3a: Secondary | ICD-10-CM | POA: Diagnosis not present

## 2024-02-05 DIAGNOSIS — I10 Essential (primary) hypertension: Secondary | ICD-10-CM | POA: Diagnosis not present

## 2024-02-05 DIAGNOSIS — I129 Hypertensive chronic kidney disease with stage 1 through stage 4 chronic kidney disease, or unspecified chronic kidney disease: Secondary | ICD-10-CM | POA: Diagnosis not present

## 2024-02-05 DIAGNOSIS — E782 Mixed hyperlipidemia: Secondary | ICD-10-CM | POA: Diagnosis not present

## 2024-03-22 DIAGNOSIS — N1832 Chronic kidney disease, stage 3b: Secondary | ICD-10-CM | POA: Diagnosis not present

## 2024-03-22 DIAGNOSIS — R809 Proteinuria, unspecified: Secondary | ICD-10-CM | POA: Diagnosis not present

## 2024-03-22 DIAGNOSIS — I129 Hypertensive chronic kidney disease with stage 1 through stage 4 chronic kidney disease, or unspecified chronic kidney disease: Secondary | ICD-10-CM | POA: Diagnosis not present

## 2024-03-22 DIAGNOSIS — I5032 Chronic diastolic (congestive) heart failure: Secondary | ICD-10-CM | POA: Diagnosis not present

## 2024-03-25 ENCOUNTER — Other Ambulatory Visit: Payer: Self-pay | Admitting: Cardiology

## 2024-04-18 ENCOUNTER — Other Ambulatory Visit: Payer: Self-pay

## 2024-04-19 ENCOUNTER — Other Ambulatory Visit: Payer: Self-pay | Admitting: Cardiovascular Disease

## 2024-04-21 MED ORDER — ROSUVASTATIN CALCIUM 10 MG PO TABS: 10.0000 mg | ORAL_TABLET | Freq: Every day | ORAL | 0 refills | Status: DC

## 2024-05-24 ENCOUNTER — Other Ambulatory Visit: Payer: Self-pay | Admitting: Neurology

## 2024-05-30 ENCOUNTER — Other Ambulatory Visit: Payer: Self-pay | Admitting: Cardiovascular Disease

## 2024-06-01 ENCOUNTER — Other Ambulatory Visit: Payer: Self-pay | Admitting: Cardiovascular Disease

## 2024-06-06 MED ORDER — ROSUVASTATIN CALCIUM 10 MG PO TABS: 10.0000 mg | ORAL_TABLET | Freq: Every day | ORAL | 0 refills | Status: AC

## 2024-06-06 NOTE — Telephone Encounter (Signed)
 Labs completed on 02/01/24
